# Patient Record
Sex: Male | Born: 1955
Health system: Southern US, Community
[De-identification: ages and names within clinical notes are randomized; demographics above are authoritative.]

## PROBLEM LIST (undated history)

## (undated) DIAGNOSIS — E559 Vitamin D deficiency, unspecified: Secondary | ICD-10-CM

## (undated) DIAGNOSIS — T402X5A Adverse effect of other opioids, initial encounter: Secondary | ICD-10-CM

## (undated) DIAGNOSIS — I872 Venous insufficiency (chronic) (peripheral): Secondary | ICD-10-CM

## (undated) DIAGNOSIS — E785 Hyperlipidemia, unspecified: Secondary | ICD-10-CM

## (undated) DIAGNOSIS — E119 Type 2 diabetes mellitus without complications: Secondary | ICD-10-CM

## (undated) DIAGNOSIS — H269 Unspecified cataract: Secondary | ICD-10-CM

## (undated) DIAGNOSIS — Z940 Kidney transplant status: Secondary | ICD-10-CM

## (undated) DIAGNOSIS — M719 Bursopathy, unspecified: Secondary | ICD-10-CM

## (undated) DIAGNOSIS — K219 Gastro-esophageal reflux disease without esophagitis: Secondary | ICD-10-CM

## (undated) DIAGNOSIS — E669 Obesity, unspecified: Secondary | ICD-10-CM

## (undated) DIAGNOSIS — I10 Essential (primary) hypertension: Secondary | ICD-10-CM

## (undated) DIAGNOSIS — K5903 Drug induced constipation: Secondary | ICD-10-CM

## (undated) DIAGNOSIS — J189 Pneumonia, unspecified organism: Secondary | ICD-10-CM

## (undated) DIAGNOSIS — G4730 Sleep apnea, unspecified: Secondary | ICD-10-CM

## (undated) DIAGNOSIS — E1121 Type 2 diabetes mellitus with diabetic nephropathy: Secondary | ICD-10-CM

## (undated) DIAGNOSIS — R011 Cardiac murmur, unspecified: Secondary | ICD-10-CM

## (undated) DIAGNOSIS — R0600 Dyspnea, unspecified: Secondary | ICD-10-CM

## (undated) DIAGNOSIS — E213 Hyperparathyroidism, unspecified: Secondary | ICD-10-CM

## (undated) HISTORY — DX: Type 2 diabetes mellitus without complications: E11.9

## (undated) HISTORY — DX: Obesity, unspecified: E66.9

## (undated) HISTORY — PX: UPPER GASTROINTESTINAL ENDOSCOPY: SHX188

## (undated) HISTORY — DX: Hyperparathyroidism, unspecified: E21.3

## (undated) HISTORY — DX: Essential (primary) hypertension: I10

## (undated) HISTORY — DX: Unspecified cataract: H26.9

## (undated) HISTORY — PX: WISDOM TOOTH EXTRACTION: SHX21

## (undated) HISTORY — DX: Hyperlipidemia, unspecified: E78.5

## (undated) HISTORY — PX: NEPHRECTOMY TRANSPLANTED ORGAN: SUR880

## (undated) HISTORY — DX: Kidney transplant status: Z94.0

## (undated) HISTORY — DX: Gastro-esophageal reflux disease without esophagitis: K21.9

## (undated) HISTORY — PX: COLONOSCOPY: SHX174

## (undated) HISTORY — DX: Vitamin D deficiency, unspecified: E55.9

## (undated) HISTORY — DX: Type 2 diabetes mellitus with diabetic nephropathy: E11.21

## (undated) HISTORY — PX: EYE SURGERY: SHX253

---

## 1999-03-31 ENCOUNTER — Encounter: Admission: RE | Admit: 1999-03-31 | Discharge: 1999-06-29 | Payer: Self-pay | Admitting: Family Medicine

## 2000-11-28 ENCOUNTER — Encounter: Admission: RE | Admit: 2000-11-28 | Discharge: 2001-02-26 | Payer: Self-pay | Admitting: Emergency Medicine

## 2001-01-29 ENCOUNTER — Encounter: Payer: Self-pay | Admitting: Ophthalmology

## 2001-01-29 ENCOUNTER — Ambulatory Visit (HOSPITAL_COMMUNITY): Admission: AD | Admit: 2001-01-29 | Discharge: 2001-01-29 | Payer: Self-pay | Admitting: Ophthalmology

## 2002-01-01 ENCOUNTER — Ambulatory Visit: Admission: RE | Admit: 2002-01-01 | Discharge: 2002-01-01 | Payer: Self-pay | Admitting: Otolaryngology

## 2002-02-26 ENCOUNTER — Ambulatory Visit: Admission: RE | Admit: 2002-02-26 | Discharge: 2002-02-26 | Payer: Self-pay | Admitting: Otolaryngology

## 2002-10-15 ENCOUNTER — Encounter (INDEPENDENT_AMBULATORY_CARE_PROVIDER_SITE_OTHER): Payer: Self-pay | Admitting: *Deleted

## 2002-10-15 ENCOUNTER — Other Ambulatory Visit: Admission: RE | Admit: 2002-10-15 | Discharge: 2002-10-15 | Payer: Self-pay | Admitting: *Deleted

## 2002-12-19 ENCOUNTER — Ambulatory Visit (HOSPITAL_COMMUNITY): Admission: RE | Admit: 2002-12-19 | Discharge: 2002-12-19 | Payer: Self-pay | Admitting: *Deleted

## 2002-12-19 ENCOUNTER — Encounter: Payer: Self-pay | Admitting: *Deleted

## 2003-06-30 ENCOUNTER — Encounter: Admission: RE | Admit: 2003-06-30 | Discharge: 2003-06-30 | Payer: Self-pay | Admitting: Family Medicine

## 2003-11-14 ENCOUNTER — Inpatient Hospital Stay (HOSPITAL_COMMUNITY): Admission: EM | Admit: 2003-11-14 | Discharge: 2003-11-16 | Payer: Self-pay

## 2004-02-17 ENCOUNTER — Ambulatory Visit (HOSPITAL_COMMUNITY): Admission: RE | Admit: 2004-02-17 | Discharge: 2004-02-17 | Payer: Self-pay | Admitting: Nephrology

## 2004-02-23 ENCOUNTER — Ambulatory Visit (HOSPITAL_COMMUNITY): Admission: RE | Admit: 2004-02-23 | Discharge: 2004-02-23 | Payer: Self-pay | Admitting: Nephrology

## 2004-02-26 ENCOUNTER — Ambulatory Visit (HOSPITAL_COMMUNITY): Admission: RE | Admit: 2004-02-26 | Discharge: 2004-02-26 | Payer: Self-pay | Admitting: Nephrology

## 2004-02-27 ENCOUNTER — Encounter (INDEPENDENT_AMBULATORY_CARE_PROVIDER_SITE_OTHER): Payer: Self-pay | Admitting: Specialist

## 2004-02-27 ENCOUNTER — Observation Stay (HOSPITAL_COMMUNITY): Admission: RE | Admit: 2004-02-27 | Discharge: 2004-02-29 | Payer: Self-pay | Admitting: Nephrology

## 2004-08-01 HISTORY — PX: NEPHRECTOMY TRANSPLANTED ORGAN: SUR880

## 2004-08-01 HISTORY — PX: KIDNEY TRANSPLANT: SHX239

## 2005-02-09 ENCOUNTER — Ambulatory Visit (HOSPITAL_COMMUNITY): Admission: RE | Admit: 2005-02-09 | Discharge: 2005-02-09 | Payer: Self-pay | Admitting: Nephrology

## 2007-07-11 ENCOUNTER — Ambulatory Visit: Payer: Self-pay | Admitting: Internal Medicine

## 2007-07-11 ENCOUNTER — Inpatient Hospital Stay (HOSPITAL_COMMUNITY): Admission: EM | Admit: 2007-07-11 | Discharge: 2007-07-14 | Payer: Self-pay | Admitting: Emergency Medicine

## 2010-08-22 ENCOUNTER — Encounter: Payer: Self-pay | Admitting: Nephrology

## 2010-09-26 ENCOUNTER — Emergency Department (INDEPENDENT_AMBULATORY_CARE_PROVIDER_SITE_OTHER): Payer: BC Managed Care – PPO

## 2010-09-26 ENCOUNTER — Emergency Department (HOSPITAL_BASED_OUTPATIENT_CLINIC_OR_DEPARTMENT_OTHER)
Admission: EM | Admit: 2010-09-26 | Discharge: 2010-09-26 | Disposition: A | Discharge status: Home / Self Care | Payer: BC Managed Care – PPO | Attending: Emergency Medicine | Admitting: Emergency Medicine

## 2010-09-26 DIAGNOSIS — E119 Type 2 diabetes mellitus without complications: Secondary | ICD-10-CM | POA: Insufficient documentation

## 2010-09-26 DIAGNOSIS — W010XXA Fall on same level from slipping, tripping and stumbling without subsequent striking against object, initial encounter: Secondary | ICD-10-CM

## 2010-09-26 DIAGNOSIS — M545 Low back pain, unspecified: Secondary | ICD-10-CM

## 2010-09-26 DIAGNOSIS — Z940 Kidney transplant status: Secondary | ICD-10-CM | POA: Insufficient documentation

## 2010-09-26 DIAGNOSIS — Z794 Long term (current) use of insulin: Secondary | ICD-10-CM | POA: Insufficient documentation

## 2010-09-26 DIAGNOSIS — Y92009 Unspecified place in unspecified non-institutional (private) residence as the place of occurrence of the external cause: Secondary | ICD-10-CM | POA: Insufficient documentation

## 2010-12-14 NOTE — Consult Note (Signed)
Steven [name], NIKIRK NO.:  [id number]   MEDICAL RECORD NO.:  [id number]          PATIENT TYPE:  INP   LOCATION:  2028                         FACILITY:  [facility]   PHYSICIAN:  Windy Kalata, M.D.DATE OF BIRTH:  1956/03/18   DATE OF CONSULTATION:  07/11/2007  DATE OF DISCHARGE:                                 CONSULTATION   REFERRING PHYSICIAN:  Dr. Tery Sanfilippo.   REASON FOR CONSULTATION:  Living related donor renal transplant.   HISTORY OF PRESENT ILLNESS:  A 55 year old white male who is admitted  today because of fevers and cough for 4 weeks.  He was initially treated  by primary care physician with Avelox at the end of November in which  his symptoms improved; however, they have worsened over the past week.  He has complained of fevers, chills and sweats.  His p.o. intake has  been poor over the last few days.  Of note, is the fact that he has been  off his dapsone for at least 6 months or more.  His baseline serum  creatinine is in the upper 1's, and from the last records I have from  September 2008 when he was seen by Dr. [name], serum creatinine  was 1.7.  He has had some mild nausea but denies any GI symptoms in  terms of diarrhea.   PAST MEDICAL HISTORY:  Significant for:  1. End-stage renal disease secondary to diabetes, status post living      related transplant at [facility] in October 2006.  2. Diabetes.  3. Gastroesophageal reflux disease.  4. History of hypertension.   ALLERGIES:  SULFA AND NORVASC WHICH CAUSES SWELLING.   CURRENT MEDICATIONS:  Include albuterol inhaler, Hectorol 0.5 mg every  other day, Lovenox 40 mg nightly, Tricor 48 mg daily, glyburide 5 mg  b.i.d., Lantus 20 units a day, Xopenex inhaler, Actos 30 mg a day,  Prograf 2 mg b.i.d., Myfortic 720 mg b.i.d.   SOCIAL HISTORY:  He is a nonsmoker, though he chews tobacco.  He denies  alcohol use.  He is married with 4 children.  He works as a Financial controller.   FAMILY HISTORY:  Negative for renal disease.   REVIEW OF SYSTEMS:  Appetite has been poor over the last few days.  He  complains of productive cough of yellow/gray sputum.  Denies any  shortness of breath.  Denies any chest pains.  No recent change in bowel  habits.  No new arthritic complaints.  No new skin rashes.  No dysuria.  Rest of the review of systems unremarkable.   PHYSICAL EXAMINATION:  VITAL SIGNS:  Blood pressure 101/59, pulse 97,  temperature 98.1.  GENERAL:  In general, a 55 year old white male in no acute distress.  HEENT:  Sclera nonicteric.  Extraocular muscles are intact.  NECK:  Reveals no JVD.  LUNGS:  Reveal bibasilar crackles in both bases, the right greater than  left.  HEART:  Regular rate and rhythm with a 2/6 systolic murmur at the left  sternal border.  ABDOMEN:  Positive bowel sounds, nontender, nondistended.  Transplant in  the right lower quadrant which is nontender with no bruits.  EXTREMITIES:  Zero-to-trace edema.  NEUROLOGIC EXAM:  Cranial nerves are intact.  There is no asterixis.   IMPRESSION:  1. Living related donor renal transplant with slight increase in      creatinine, probably secondary to infection plus or minus some      volume depletion.  2. Hypokalemia.  3. Fever possibly related to pneumonia, though his chest x-ray      currently is not very impressive.  4. Diabetes mellitus.   RECOMMENDATIONS:  I agree with IV hydration.  We will replace potassium.  We will check a magnesium level, check Prograf level, recheck serum  creatinine in the morning.  I would strongly consider coverage for  Pneumocystis pneumonia as he has been off his dapsone for over 6 months.  I have notified the ID resident of this so she can discuss it with the  infectious disease attending.   Thank you very much for the consult.  I will follow the patient with  you.           ______________________________  Windy Kalata, M.D.      MTM/MEDQ  D:  07/11/2007  T:  07/12/2007  Job:  [id number]

## 2010-12-17 NOTE — H&P (Signed)
NAME:  Steven [name], MENCH NO.:  [id number]   MEDICAL RECORD NO.:  [id number]                   PATIENT TYPE:  INP   LOCATION:  2004                                 FACILITY:  [facility]   PHYSICIAN:  Jon Gills, M.D.             DATE OF BIRTH:  [birth year]   DATE OF ADMISSION:  11/14/2003  DATE OF DISCHARGE:                                HISTORY & PHYSICAL   IDENTIFICATION STATEMENT:  This is a 55 year old white gentleman with morbid  obesity, obstructive sleep apnea, diabetes mellitus, who is here with  complaints of lower extremity edema since Monday night and some shortness of  breath.  His primary care physician is Dr. Alcario Drought with [facility].  His pulmonologist is Dr. Wilburn Cornelia.  His nephrologist is Dr.  Corliss Parish.   HISTORY OF PRESENT ILLNESS:  This patient was in his usual state of health  when Monday night when he was bending over putting on his shoes he started  experiencing increasing shortness of breath.  He also has noticed some  dyspnea on exertion about the same time.  In addition, the patient has  noticed lower extremity edema over the same time.  Of significance, he has  had a weight gain of about 20 pounds over the past several months.  He has  had no paroxysmal nocturnal dyspnea, and there is no orthopnea.  He normally  sleeps flat with him needing CPAP.  So in spite of his sense of shortness of  breath and lower extremity edema, there has been no orthopnea.  Of  significance once again, he has been on Norvasc over the past 3 or 4 months.  The patient did notice an episode of right-sided chest pain radiating to the  back on Tuesday.  He apparently woke up with it that morning.  He did  notice, however, certain types of right arm movement would make it better,  and other times certain other types of right arm movement would make it  worse.  When this started happening he was out of town, he had called Dr.  [name], his nephrologist, who advise him over the phone to increase  his Lasix from 40 mg b.i.d. to 80 mg q.a.m. and keep the same dose in the  evening.   In regards to his cardiac risk factors, there is diabetes mellitus, he does  have a history of hypertension, he is obese, he chews tobacco, and there is  a great-grandfather on the maternal side with MI, but no one else sooner.  His cholesterol apparently is normal.  He does not exercises on a regular  basis secondary to his anemia.   Of significance also, is the fact that he does have chronic ongoing anemia  which is normocytic, normochromic, that was diagnosed approximately two  years ago, but his hemoglobin tends to run anywhere from 9 to 10.   The patient was seen  in Dr. Shelva Majestic clinic today and the patient was  told by Dr. [name] that because of the weekend she would have just  sent him as an outpatient to see cardiology, but with this weekend  approaching, she felt that it might be best that he got admitted.  The  patient has noted that with an increase in the Lasix he has had some  increased urination.  Also of significance, is the fact that he does have  proteinuria that has been noted for which he was initially prompted to be  seen by nephrology.  As mentioned, Dr. [name] had written a note  giving some breakdown of his symptoms that he had presented with.   ALLERGIES:  SULFA, he gets a rash and passes out.  This was as a child.  There is no shortness of breath with it or anaphylaxis type of reaction.  Also of significance, he has been on Lasix which is a good thing.   CURRENT MEDICATIONS:  1. Lasix 80 mg p.o. q.a.m. and 40 mg p.o. q.p.m.  2. Glyburide 5 mg p.o. b.i.d.  3. Lisinopril 40 mg p.o. daily.  4. Norvasc 5 mg p.o. b.i.d. for the past 3 or 4 months.  5. Iron 325 mg p.o. b.i.d.  6. Aspirin 81 mg p.o. daily.  7. Protonix 40 mg p.o. daily.   PAST MEDICAL HISTORY:  1. Diabetes mellitus  with retinopathy, status post vitrectomy, membrane     peeling with photocoagulation in the left eye in July 2002, for pre-     retinal hemorrhage.  He also has proteinuria.  2. History of hypertension.  3. History of chronic renal insufficiency with proteinuria which Dr.     Shelva Majestic notes that came with him from the clinic says it was multi-     factorial, probably from diabetes mellitus, contribution from     hypertension, and his former chronic non-steroidal anti-inflammatory use.     He did have a renal ultrasound done in November 2004, according to the E-     chart here which showed essentially normal kidneys.  4. History of anemia which was diagnosed three years ago and has had     extensive workup by oncology.  The workup started towards the end of     2003, and may be beginning into 2004.  He has had very extensive workup,     including bone marrow biopsies and information sent to a facility in     [place], and I am thinking maybe it is __________, I am not very sure.     All of the workup so far has been inconclusive without any definitive     diagnosis for his anemia.  His hemoglobin in January 2005, was 10.4,     based on the information that came from Dr. Shelva Majestic clinic.  He     has also had EGD and colonoscopy done in 2004, by Dr. Cristina Gong, which     essentially showed reflux on the upper endoscopy, but was not significant     enough to contribute towards his anemia, and his colonoscopy only showed     benign polyps.  5. History of obstructive sleep apnea diagnosed two years ago.  He is on     CPAP.  He just received a new mask, but not for failure of the previous     mask, but just to get an updated mask.  6. 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  7. History of gastroesophageal reflux disease.  PAST SURGICAL HISTORY:  1. As mentioned earlier, in  July 2002, underwent vitrectomy and membrane     peeling with photocoagulation for pre-retinal hemorrhage.  2. Left knee surgery to his kneecaps secondary to sports injury.  3. Right little finger has a Teflon joint.  This was incurred as a little     child.  4. Right hand surgery from crushing football injury.  He has also had scalp     cysts removed and with some teeth extraction.   FAMILY HISTORY:  Negative for cancers of any kind, negative for anemia of  any kind.  Just as mentioned earlier, MI as noted in the maternal great-  grandfather.   SOCIAL HISTORY:  He has been married for the past six years.  They have  three kids.  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.  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  All of which has been looked into as part of  his workup for his anemia.   PHYSICAL EXAMINATION:  VITAL SIGNS:  Temperature of 98.1, blood pressure  173/80, pulse of 80, respiratory rate 18, saturations on room air were 100%.  His repeat blood pressure was 135/63, pulse of 75, respiratory rate 16, and  saturations of 99%.  This was after he had received Lasix 40 mg IV in the  emergency room.  GENERAL:  He is in no apparent distress.  Certainly does not look dyspneic  or tachypneic.  He is morbidly obese, especially with abdominal obesity.  HEENT:  He has __________JVD, but there is no thyromegaly.  His pulses in  the carotids are good.  LUNGS:  Actually clear to auscultation bilaterally without any crackles or  wheezes.  EXTREMITIES:  In the sacral area, there is no presacral edema.  BACK:  No CV angle tenderness.  ABDOMEN:  Morbidly obese, bowel sounds are normal, soft, nontender.  Certainly, it is difficult to tell organomegaly with his abdominal size.  EXTREMITIES:  Lower extremities:  He has about 2+ pitting edema in the  pretibial area and maybe trace in the thighs.  He has small  little red spots  from some insect bites on the right leg.  NEUROLOGIC:  No gross deficits.  Cranial nerves are grossly intact.   LABORATORY DATA:  He had only a H&H done and ISTAT electrolytes, and the  CPK.  His blood gas showed a pH of 7.37, PCO2 of 42, bicarbonate of 24,  there was no mention of O2 since this was done to obtain his electrolytes.  His H&H is 9.2 and 27, I do not have the rest of the CBC.  His ISTAT  electrolytes showed a sodium of 142, potassium of 2.3, chloride of 106, BUN  and creatinine of 42 and 2.9, glucose of 92.  He did have a BMP done through  Dr. Shelva Majestic clinic, and that showed a sodium of 146, potassium of 3.6  on this one, chloride of 105, BUN was 40, creatinine of 2.8, albumin of 2.9,  phosphorus of 4.9, and bicarbonate was 26.  His CPK is elevated at 396,  troponin-I is normal at 0.02, MB is elevated at 14.2, BNP is 134.3, which is not significantly elevated.  His EKG done at Dr. Shelva Majestic clinic I  believe shows normal sinus rhythm, there is really no ST-T wave changes,  there is no suggestion of old MI either.  His chest x-ray which is a  portable x-ray shows cardiomegaly and some mild vascular congestion, but no  pulmonary edema.  There is no infiltrative process suggested either.   ASSESSMENT AND PLAN:  1. Lower extremity edema with shortness of breath and dyspnea on exertion,     possibly has right-sided heart  failure secondary to underlying lung     disease, probably either from whether he might have chronic pulmonary     embolism with recent right-sided chest pain being of a concern.     Certainly with his obstructive sleep apnea pattern which can cause     pulmonary hypertension also, although he has been treated, needs to be     considered.  Currently, there is no suggestion of left-sided heart     failure.  His BNP is normal, although some of it may also be reflective     of the fact that his Lasix was recently increased;  however,  with his BNP     being fairly unremarkable, my guess is that he really was not in left-     sided heart failure.  His elevated CPK certainly could be explained by     the fact that he has renal insufficiency which would then account for     decreased clearance of the creatinine kinase.  The troponin-I being in     the normal range would correlate with this picture.  I will, however, go     ahead and bring him in, rule him out for a myocardial infarction using     his troponins, and try to obtain a V/Q scan to rule out pulmonary     embolism.  I spoke with Dr. Jaci Standard from cardiology to see if we could     facilitate getting an echocardiogram with talking with cardiology, and     she said that since it is non-emergent that we would not be able to get     an echocardiogram.  Therefore, the plan is that if his V/Q scan is     negative and his troponin-I is negative for myocardial infarction, we     will get to send him home tomorrow.  Certainly, I think some of his lower     extremity edema may be coming from the Norvasc which we know can cause     lower extremity edema with vasodilatation there in the lower extremity     vasculature.  Therefore, we will go ahead and discontinue his Norvasc for     now.  I am going to place him on IV diuresis through today and see how he     does, but clinically he actually looks very good and stable.  Certainly,     having anemia does not help his symptoms, however, since anemia is not a     new problem I do not think it is causing a major contribution towards his     heart failure, but certainly some.  2. History of anemia.  We will continue him on his iron.  We will not do any     further workup at this point in time aside from checking his TSH again     because of some concerns of weight gain and change in BM.  This is     something that Dr. [name] from her note is  also looking into and    doing any further studies, but for this hospitalization we  will not do     anything aside from continuing his iron.  3. Weight gain with some change in his BM.  Certainly, some of the weight     came can be secondary to his lower extremity edema, and also from     decreased activity secondary to decreased energy from his anemia.  He has     had some change in BM's, and that might be secondary to his increased     iron which he correlates with, but we will go ahead and do a TSH.  He has     had a normal colonoscopy, so we certainly are not worried about     gastrointestinal pathology, and he has also had     esophagogastroduodenoscopy.  4. Mild hypokalemia.  Some of it may be with his increase in his Lasix.  He     certainly did get IV Lasix here, and I do not know if the labs were done     after he received the IV Lasix.  But, we will go ahead and replace him     gently since his creatinine is 2.9.  5. History of diabetes mellitus.  He has had acceptable hemoglobins, A1C's     done as an outpatient from what he tells me, and his sugars really have     not been running erratic, and they have been running anywhere from 100 to     115, so I do not see a reason for [place] to do another hemoglobin A1C with     this admission.  We will just do the CBG's t.i.d. while he is here, and     continue his Glipizide and give him an appropriately managed diet.  6. History of hypertension.  Since I am taking him off of Norvasc secondary     to the lower extremity edema, I will add possibly Cardizem to his     regimen.  We will continue him on his Lisinopril.  I was thinking of     adding an angiotensin receptor blockade, but I will hold off on this and     we will leave this to Dr. [name], especially with [place] thinking of     discharging him possibly tomorrow if he rules out for a pulmonary     embolism and myocardial infarction, but certainly for now we will do the     Cardizem and send him home on Cardizem which I understand will also help     from a  proteinuria standpoint.  I will continue his Lasix as mentioned     earlier, but will do it intravenously at least for the next 24 hours.  7. Obstructive sleep apnea.  We will continue his CPAP.  His wife is going     to bring his mask in.  8. History of gastroesophageal reflux disease.  He will be continued on his     Protonix.                                                Jon Gills, M.D.    RRV/MEDQ  D:  11/14/2003  T:  11/15/2003  Job:  [id number]   cc:   Theophilus Kinds, M.D.  [place], [place]  Fax: [phone]   Dr. Wilburn Cornelia  Pulmonologist   Louis Meckel, M.D.  [address]  [place]  [place] [address]  Fax: [phone]

## 2010-12-17 NOTE — Discharge Summary (Signed)
NAMEAXCEL, BHAGAT NO.:  [id number]   MEDICAL RECORD NO.:  [id number]          PATIENT TYPE:  INP   LOCATION:  2028                         FACILITY:  [facility]   PHYSICIAN:  Tery Sanfilippo, MD     DATE OF BIRTH:  09/23/1955   DATE OF ADMISSION:  07/11/2007  DATE OF DISCHARGE:  07/14/2007                               DISCHARGE SUMMARY   DISCHARGE DIAGNOSES:  1. Dyspnea secondary community acquired pneumonia.  2. Mild to moderate aortic stenosis by echo with normal left      ventricular function as an outpatient.  3. History of renal transplant October 2006, with a creatinine 1.35 at      discharge.  4. Obesity and sleep apnea, on CPAP.  5. Coronary calcification noted on CT scan.   HOSPITAL COURSE:  The patient is a 55 year old male who was sent to Dr.  Elisabeth Cara for history of dyspnea.  He had a fever over 103 when we saw him  in the office.  He appeared ill.  He was admitted to telemetry and seen  in consult by the ID service as well as renal service.  It was suspected  he had community-acquired pneumonia.  CT scan confirmed pneumonia in the  lower lobes.  There was also an incidental note of calcified of coronary  arteries which appeared advanced for his age.  He was treated with IV  antibiotics.  The renal service followed him with [place].  He improved on  antibiotics.  We felt he was stable for discharge July 14, 2007.  His creatinine at discharge is 1.35.  He will follow up with the renal  service, and Dr. Elder Cyphers and Dr. Elisabeth Cara.   DISCHARGE MEDICATIONS:  1. Prograf 2 tablets twice a day.  2. Myfortic 720 mg 2 tablets twice a day.  3. Glyburide 5 mg twice a day.  4. Actos 30 mg daily.  5. Tricor 48 mg a day.  6. Hectorol 0.5 mcg every other day.  7. Lantus insulin 20 units daily.  8. NovoLog on sliding scale.  9. Avelox 400 mg a day as directed.  10.Mucinex 2 tablets b.i.d.   LABORATORY DATA:  White count at discharge 8.5, hemoglobin 11.8,  hematocrit 35.9, platelets 109.  INR 1.3.  Sodium 134, potassium 4.2,  BUN 15, creatinine 1.35, his creatinine peaked during this  hospitalization and 2.28.  Liver functions were normal.  Albumin is low  at 2.8 at discharge.  BNP was 100.  Blood culture was negative.  Sputum  was negative for Legionella.  Urine culture was negative.  Sputum  culture was inadequate.  Influenza test was negative.   DISCHARGE DIAGNOSES:  1. Dyspnea secondary community-acquired pneumonia.  2. Mild to moderate AS by echocardiogram.  3. Renal transplant with acute on chronic renal insufficiency this      admission, improved at discharge.  4. Obesity and sleep apnea on CPAP.  5. Insulin-dependent diabetes.  6. Calcified coronaries out of proportion to his age noted on CT scan      of his chest.   PLAN:  The patient will  follow-up with Dr. Elder Cyphers and Dr. Elisabeth Cara as an  outpatient as well as [facility].      Erlene Quan, P.A.      Tery Sanfilippo, MD  Electronically Signed    LKK/MEDQ  D:  08/13/2007  T:  08/13/2007  Job:  [id number]   cc:   [facility] Genelle Bal, M.D.

## 2010-12-17 NOTE — Discharge Summary (Signed)
NAME:  Steven [name], Steven [name]                         ACCOUNT NO.:  [id number]   MEDICAL RECORD NO.:  [id number]                   PATIENT TYPE:  INP   LOCATION:  2004                                 FACILITY:  [facility]   PHYSICIAN:  Melissa L. Lovena Le, MD               DATE OF BIRTH:  1955/09/13   DATE OF ADMISSION:  11/14/2003  DATE OF DISCHARGE:  11/16/2003                                 DISCHARGE SUMMARY   ADMISSION DIAGNOSES:  1. Lower extremity swelling.  2. Hypertension.  3. Diabetes.   DISCHARGE DIAGNOSES:  1. Volume overload possibly from right heart failure. 2-D echo to confirm     diagnosis.  2. Patient with renal dysfunction in the setting of volume overload.  3. Hypertension, better controlled. We did discontinue his Norvasc, which     may have been contributing to his lower extremity swelling and started on     Cardizem CD.  4. Diabetes. He remains on all his oral medications. A hemoglobin A1C is     pending at the time of discharge to assess the current management state     of the diabetes.  5. Obstructive sleep apnea. Currently on a C-Pap machine.  6. Chronic, stable anemia. Previous workup done as an outpatient.   DISCHARGE MEDICATIONS:  1. Lasix 80 mg p.o. q.a.m., 40 mg p.o. q.p.m.  2. Glyburide 5 mg p.o. b.i.d.  3. Aspirin 81 mg daily.  4. Iron 325 mg twice daily.  5. Protonix 40 mg p.o. daily.  6. Cardizem CD 240 mg p.o. daily.  7. Colace 100 mg p.o. b.i.d. for constipation.  8. Potassium chloride 20 mEq p.o. daily.   HISTORY OF PRESENT ILLNESS:  The patient is a 55 year old white male with a  known history of chronic renal insufficiency, diabetes, and chronic  obstructive sleep apnea who states that he noticed increased lower extremity  edema, which came on rapidly with minimal elements of shortness of breath.  The patient states that he was bending over to put his shoes on when he  started to experience the shortness of breath and then noticed further  dyspnea  later on in the evening. The patient relates a weight gain of 20  pounds over the past several months. He denied PND or orthopnea, but on  physical exam in the emergency room was noted to have 2-3+ pitting edema. He  was seen by his nephrologist in the outpatient setting who felt initially  that increasing his Lasix would suffice and then decision was made to bring  him into the hospital for further evaluation. The patient was admitted to  telemetry. He was ruled out on cardiac enzymes. Of note, his CPKs and MBs  were elevated, however, in discussing this with cardiology, this is likely  related to his chronic renal insufficiency. Troponins at no time were  increased, so therefore, It was felt that this could be further worked up  as  an outpatient. We converted to observation visit to a full hospital stay in  order to provide him with IV diuresis with Lasix and titrate his cardiac  medications. The Norvasc was discontinued in light of the lower extremity  edema, which may be exacerbated by Norvasc and he was started on Cardizem CD  240 mg daily, which he tolerated well. On the day of discharge his vital  signs were stable. Temperature 97.2, blood pressure 137/76, pulse 71,  respiratory rate 20. His blood glucoses during the hospitalization ranged  from the low 120s to as high as 343. His hemoglobin A1C at the time of this  dictation is pending.   PHYSICAL EXAMINATION:  GENERAL:  This is a bright, well-developed, well-  nourished white male in no acute distress.  HEENT:  His pupils are equal, round, and reactive to light. Extraocular  movements intact. Mucous membranes moist.  NECK:  Supple. There is no JVD, however. Today is noted a right carotid  bruit, which sounds as if it is referred from his right intercostal space  related to the aortic valve.  CHEST:  Clear to auscultation with distant breath sounds.  CARDIOVASCULAR:  Regular rate and rhythm. Positive S1, S2. No S3, S4. There  are  some premature ventricular contractions noted. He does have a 1-2/6  grade murmur in the right intercostal space that radiates to the right neck.  ABDOMEN:  Obese, nontender. Nondistended with positive bowel sounds.  EXTREMITIES:  Improved edema, which could be trace graded with TED hose in  place, 1+ pulses bilaterally in the lower extremities.   LABORATORY DATA:  Stable creatinine of 2.8 at the time of discharge. A BUN  of 41, potassium 0.0, which will be repleted orally. His thyroid was  examined and found to be 1.184 during the hospitalization. Beta natriuretic  peptide mildly elevated at 134.4. Negative cardiac enzymes except for mildly  elevated CPK.  A hemoglobin A1C and lipid panel were pending at the time of  discharge.   The patient will be started on mild potassium replacement at 20 mEq daily  and minus the increased Lasix dosing.   DISCHARGE INSTRUCTIONS:  He is instructed to follow up with Dr. Alcario Drought next  week to arrange a 2-D echo and cardiology consult as well as follow-up his  laboratory values. At the time of discharge the patient is stable. We have  reviewed stress reduction, dietary restrictions in terms of fluid and salt  as well as weight reduction. We have also discontinued using chewing  tobacco. We have instructed him on the risks of oropharyngeal cancer and he  expressed understanding. We will recommend as needed a 2-D echo and cardiac  follow-up as well as labs to assess his renal function and potassium.                                                Melissa L. Lovena Le, MD    MLT/MEDQ  D:  11/16/2003  T:  11/17/2003  Job:  [id number]   cc:   Theophilus Kinds, M.D.  [place], [place]  Fax: [phone]   Louis Meckel, M.D.  [address]  [place]  [place] [address]  Fax: [phone]

## 2010-12-17 NOTE — Op Note (Signed)
[place]  Patient:    Steven [name], Steven [name]                      MRN: [id number] Proc. Date: 01/29/01 Adm. Date:  [id number] Attending:  Elmon Else                           Operative Report  PREOPERATIVE DIAGNOSIS:  Proliferative diabetic retinopathy, left eye, with preretinal hemorrhage.  POSTOPERATIVE DIAGNOSIS:  Proliferative diabetic retinopathy, left eye, with preretinal hemorrhage.  PROCEDURES:  Pars plana vitrectomy and membrane peeling, supplemental panretinal photocoagulation, left eye.  SURGEON:  Elmon Else, M.D.  ASSISTANT:  Duane Lope, L.P.N.  ANESTHESIA:  General endotracheal.  ESTIMATED BLOOD LOSS:  Less than 1 cc.  COMPLICATIONS:  None.  DESCRIPTION OF PROCEDURE:  The patient was taken to the operating room, where after induction of general anesthesia, the left eye was prepped and draped in the usual fashion.  The lid speculum inserted, and a conjunctival peritomy was developed temporally and superonasally with relaxing incisions at 3 and 11. Hemostasis obtained with eraser cautery.  Sclerotomies were fashioned 3.75 mm posterior to the limbus at 1:30, 10:30, and 4:30.  The superior sclerotomies were temporarily plugged and a 4 mm infusion cannula secured inferotemporally with a temporary suture of 7-0 Vicryl.  The tip was visually inspected and found to be in the vitreous cavity.  A Landers ring was secured to the globe with 7-0 Vicryl sutures at 3 and 9 and the plugs removed.  The 30 degree prismatic lens was applied to the surface of the eye and a central core, followed by a peripheral, vitrectomy was performed.  There was no posterior hyaloid separation.  A magnifying flat lens was applied to the surface of the eye.  An opening in the posterior hyaloid was made over the macular area and then the 90 degree Legrand Como pick used to gently tease the posterior hyaloid off the surface of the retina along the  inferotemporal arcade.  An area of focal adhesion accounting for _____ this was severed with sharp dissection using the [facility] curved scissors.  Posterior hyaloid was then freed inferiorly and stripped out to the periphery.  The attention was then directed in the nasal direction.  Additional adhesions were encountered, and these were likewise sharply dissected with the [facility] scissors and the posterior hyaloid lifted off these areas.  Attention then ensued in a clockwise direction, encountering adhesions at 11 as well as 1.  These were trimmed back to the surface of the retina with a vitrector and then sharp dissection used to remove the remaining attachments.  The peripheral vitrectomy was then completed 360 degrees.  An area of small hemorrhage could be seen superiorly where a previous attachment had been present, but scleral depression and further trimming of the vitreous base in this area revealed there to be no retinal breaks and no residual vitreous adhesion.  Hemorrhage stopped spontaneously, and the silicone-tipped catheter was then used to vacuum all the hemorrhage in the posterior segment. The instrument removed from the eye as the hole was plugged.  The Landers ring and lens were removed.  Inspection with the indirect ophthalmoscope and scleral depression revealed there to be no peripheral retinal breaks or tears, and light far peripheral panretinal photocoagulation was supplemented with the headscope laser.  The superior sclerotomy was then closed with 7-0 Vicryl. The infusion cannula was removed and the  preplaced suture secured.  The conjunctiva was drawn up and reapproximated with interrupted and running sutures of 6-0 plain gut.  The pressure was checked with a Barraquer tonometer and found to lay approximately at 23.  The subconjunctival space irrigated with 0.75% Marcaine followed by subconjunctival injection of 100 mg of ceftazidime and 20 mg of Decadron.  The lid speculum was  then removed and a mixed antibiotic ointment applied to the surface of the eye.  An eye patch and shield were placed over the patients eye.  On awakening from anesthesia, the patient left the operating room in good condition. DD:  01/29/01 TD:  01/29/01 Job: [id number]

## 2011-05-09 LAB — CBC
HCT: 35.9 — ABNORMAL LOW
HCT: 36.3 — ABNORMAL LOW
Hemoglobin: 11.8 — ABNORMAL LOW
Hemoglobin: 12.1 — ABNORMAL LOW
MCHC: 32.8
MCHC: 33.4
MCV: 86.2
MCV: 87.3
Platelets: 108 — ABNORMAL LOW
Platelets: 109 — ABNORMAL LOW
RBC: 4.11 — ABNORMAL LOW
RBC: 4.21 — ABNORMAL LOW
RDW: 16 — ABNORMAL HIGH
RDW: 16.3 — ABNORMAL HIGH
WBC: 10.6 — ABNORMAL HIGH
WBC: 8.5

## 2011-05-09 LAB — RENAL FUNCTION PANEL
Albumin: 2.7 — ABNORMAL LOW
Albumin: 2.7 — ABNORMAL LOW
Albumin: 2.8 — ABNORMAL LOW
BUN: 15
BUN: 19
BUN: 20
CO2: 21
CO2: 24
CO2: 24
Calcium: 7.8 — ABNORMAL LOW
Calcium: 7.9 — ABNORMAL LOW
Calcium: 8.3 — ABNORMAL LOW
Chloride: 103
Chloride: 105
Chloride: 105
Creatinine, Ser: 1.35
Creatinine, Ser: 1.63 — ABNORMAL HIGH
Creatinine, Ser: 1.92 — ABNORMAL HIGH
GFR calc Af Amer: 45 — ABNORMAL LOW
GFR calc Af Amer: 55 — ABNORMAL LOW
GFR calc Af Amer: >60
GFR calc non Af Amer: 37 — ABNORMAL LOW
GFR calc non Af Amer: 45 — ABNORMAL LOW
GFR calc non Af Amer: 56 — ABNORMAL LOW
Glucose, Bld: 173 — ABNORMAL HIGH
Glucose, Bld: 188 — ABNORMAL HIGH
Glucose, Bld: 204 — ABNORMAL HIGH
Phosphorus: 2.3
Phosphorus: 2.6
Phosphorus: 2.6
Potassium: 3.3 — ABNORMAL LOW
Potassium: 3.6
Potassium: 4.2
Sodium: 134 — ABNORMAL LOW
Sodium: 135
Sodium: 137

## 2011-05-09 LAB — URINALYSIS, ROUTINE W REFLEX MICROSCOPIC
Glucose, UA: 500 — AB
Hgb urine dipstick: NEGATIVE
Ketones, ur: NEGATIVE
Nitrite: NEGATIVE
Protein, ur: 30 — AB
Specific Gravity, Urine: 1.024
Urobilinogen, UA: 1
pH: 5.5

## 2011-05-09 LAB — COMPREHENSIVE METABOLIC PANEL
ALT: 24
ALT: 24
AST: 20
AST: 24
Albumin: 3.2 — ABNORMAL LOW
Albumin: 3.3 — ABNORMAL LOW
Alkaline Phosphatase: 56
Alkaline Phosphatase: 58
BUN: 19
BUN: 20
CO2: 22
CO2: 24
Calcium: 8.2 — ABNORMAL LOW
Calcium: 8.4
Chloride: 100
Chloride: 100
Creatinine, Ser: 2.1 — ABNORMAL HIGH
Creatinine, Ser: 2.28 — ABNORMAL HIGH
GFR calc Af Amer: 37 — ABNORMAL LOW
GFR calc Af Amer: 41 — ABNORMAL LOW
GFR calc non Af Amer: 31 — ABNORMAL LOW
GFR calc non Af Amer: 34 — ABNORMAL LOW
Glucose, Bld: 281 — ABNORMAL HIGH
Glucose, Bld: 305 — ABNORMAL HIGH
Potassium: 3.2 — ABNORMAL LOW
Potassium: 3.3 — ABNORMAL LOW
Sodium: 132 — ABNORMAL LOW
Sodium: 133 — ABNORMAL LOW
Total Bilirubin: 2 — ABNORMAL HIGH
Total Bilirubin: 2.4 — ABNORMAL HIGH
Total Protein: 6.2
Total Protein: 6.4

## 2011-05-09 LAB — DIFFERENTIAL
Basophils Absolute: 0
Basophils Relative: 0
Eosinophils Absolute: 0 — ABNORMAL LOW
Eosinophils Relative: 0
Lymphocytes Relative: 8 — ABNORMAL LOW
Lymphs Abs: 0.7
Monocytes Absolute: 0.3
Monocytes Relative: 4
Neutro Abs: 7.5
Neutrophils Relative %: 88 — ABNORMAL HIGH

## 2011-05-09 LAB — EXPECTORATED SPUTUM ASSESSMENT W GRAM STAIN, RFLX TO RESP C

## 2011-05-09 LAB — URINE MICROSCOPIC-ADD ON

## 2011-05-09 LAB — CULTURE, BLOOD (ROUTINE X 2)
Culture: NO GROWTH
Culture: NO GROWTH

## 2011-05-09 LAB — EXPECTORATED SPUTUM ASSESSMENT W REFEX TO RESP CULTURE

## 2011-05-09 LAB — LEGIONELLA ANTIGEN, URINE: Legionella Antigen, Urine: NEGATIVE

## 2011-05-09 LAB — URINE CULTURE
Colony Count: NO GROWTH
Culture: NO GROWTH

## 2011-05-09 LAB — TACROLIMUS, BLOOD: Tacrolimus Lvl: 13.9

## 2011-05-09 LAB — P CARINII SMEAR DFA: Pneumocystis carinii DFA: NEGATIVE

## 2011-05-09 LAB — PTH, INTACT AND CALCIUM
Calcium, Total (PTH): 7.6 — ABNORMAL LOW
PTH: 137.1 — ABNORMAL HIGH

## 2011-05-09 LAB — INFLUENZA A+B VIRUS AG-DIRECT(RAPID)
Inflenza A Ag: NEGATIVE
Influenza B Ag: NEGATIVE

## 2011-05-09 LAB — PROTIME-INR
INR: 1.3
Prothrombin Time: 16.2 — ABNORMAL HIGH

## 2011-05-09 LAB — APTT: aPTT: 41 — ABNORMAL HIGH

## 2011-05-09 LAB — B-NATRIURETIC PEPTIDE (CONVERTED LAB): Pro B Natriuretic peptide (BNP): 102 — ABNORMAL HIGH

## 2011-05-09 LAB — GLYCOHEMOGLOBIN, TOTAL: Hemoglobin-A1c: 5.9 % (ref <6.0)

## 2011-05-09 LAB — MAGNESIUM: Magnesium: 1.5

## 2011-05-09 LAB — LACTATE DEHYDROGENASE: LDH: 184

## 2011-10-14 ENCOUNTER — Ambulatory Visit (INDEPENDENT_AMBULATORY_CARE_PROVIDER_SITE_OTHER): Payer: BC Managed Care – PPO | Admitting: Family Medicine

## 2011-10-14 ENCOUNTER — Ambulatory Visit: Payer: BC Managed Care – PPO

## 2011-10-14 VITALS — BP 129/71 | HR 72 | Temp 98.1°F | Resp 18 | Ht 70.0 in | Wt 270.9 lb

## 2011-10-14 DIAGNOSIS — S00511A Abrasion of lip, initial encounter: Secondary | ICD-10-CM

## 2011-10-14 DIAGNOSIS — S63501A Unspecified sprain of right wrist, initial encounter: Secondary | ICD-10-CM

## 2011-10-14 DIAGNOSIS — S63509A Unspecified sprain of unspecified wrist, initial encounter: Secondary | ICD-10-CM

## 2011-10-14 DIAGNOSIS — IMO0002 Reserved for concepts with insufficient information to code with codable children: Secondary | ICD-10-CM

## 2011-10-14 DIAGNOSIS — R071 Chest pain on breathing: Secondary | ICD-10-CM

## 2011-10-14 DIAGNOSIS — S60519A Abrasion of unspecified hand, initial encounter: Secondary | ICD-10-CM

## 2011-10-14 DIAGNOSIS — R0789 Other chest pain: Secondary | ICD-10-CM

## 2011-10-14 DIAGNOSIS — S01501A Unspecified open wound of lip, initial encounter: Secondary | ICD-10-CM

## 2011-10-14 MED ORDER — HYDROCODONE-ACETAMINOPHEN 10-325 MG PO TABS: ORAL_TABLET | ORAL | Status: DC
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
## 2011-10-14 NOTE — Patient Instructions (Signed)
In the event of acute shortness of breath go to the emergency room or come in for a check. Also be checked if running a fever. If problems persist feel free to come in for reevaluation.

## 2011-10-17 ENCOUNTER — Telehealth: Payer: Self-pay

## 2011-10-17 NOTE — Telephone Encounter (Signed)
Pt was seen on last week under personal. Pt is giving [place] permission to talk with his employer and advised that he is ok to return to work today at 5:00.  Please call pts employer, debra squarewell @ [place] ext. [phone] to advise

## 2011-10-17 NOTE — Telephone Encounter (Signed)
Can we write him a note and fax to his employer?

## 2011-10-17 NOTE — Telephone Encounter (Signed)
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 Case.  Pt is working with asst tonight, and she will CB to discuss establishing case with [facility] tomorrow.

## 2011-10-17 NOTE — Telephone Encounter (Signed)
Yes we can write him a note.

## 2011-10-27 ENCOUNTER — Ambulatory Visit: Payer: BC Managed Care – PPO

## 2011-10-27 ENCOUNTER — Ambulatory Visit (INDEPENDENT_AMBULATORY_CARE_PROVIDER_SITE_OTHER): Payer: BC Managed Care – PPO | Admitting: Family Medicine

## 2011-10-27 VITALS — BP 96/60 | HR 75 | Temp 98.3°F | Resp 18 | Ht 70.0 in | Wt 271.0 lb

## 2011-10-27 DIAGNOSIS — R05 Cough: Secondary | ICD-10-CM

## 2011-10-27 DIAGNOSIS — J111 Influenza due to unidentified influenza virus with other respiratory manifestations: Secondary | ICD-10-CM

## 2011-10-27 DIAGNOSIS — S2239XA Fracture of one rib, unspecified side, initial encounter for closed fracture: Secondary | ICD-10-CM

## 2011-10-27 DIAGNOSIS — R059 Cough, unspecified: Secondary | ICD-10-CM

## 2011-10-27 MED ORDER — BENZONATATE 100 MG PO CAPS: ORAL_CAPSULE | ORAL | Status: AC

## 2011-10-27 MED ORDER — OXYCODONE-ACETAMINOPHEN 5-325 MG PO TABS: 1.0000 | ORAL_TABLET | Freq: Four times a day (QID) | ORAL | Status: AC | PRN

## 2011-10-27 NOTE — Progress Notes (Signed)
  Subjective:    Patient ID: Steven [name], male    DOB: [birth year], 56 y.o.   MRN: [id number]  HPI 56 yo male renal transplant patient and diabetic, seen 3/15 after a fall and diagnosed with a rib fracture here with cough.  Daughter sick as well.   Cough started Tuesday (3 day).  Productive green, yellow phlegm.  Great pain with cough.  Hard to take deep breaths - pain and cough.  Hard to sleep.  Given hydrocodone for the rib fracture, not helping much.  Percocet on his list, but doesn't have anymore.  Tessalon perles have always helped cough the best.    Sugars good - fastings under 110.  Last Cr 0.9   Review of Systems Negative except as per HPI     Objective:   Physical Exam  Constitutional: He appears well-developed and well-nourished.  Cardiovascular: Normal rate, regular rhythm, normal heart sounds and intact distal pulses.   No murmur heard. Pulmonary/Chest: Effort normal and breath sounds normal.  Neurological: He is alert.  Skin: Skin is warm and dry.      [facility] Primary radiology reading by Dr. Nadara Eaton: NAD    Assessment & Plan:  Cough, likely flu as daughter who accompanied him diagnosed with Flu B here today.  Discussed could test but could get false negative.  Outside tamiflu window.  Pain with percocet 5.  Tessalon perles for cough.  Rest, fluids.

## 2011-11-07 ENCOUNTER — Ambulatory Visit (INDEPENDENT_AMBULATORY_CARE_PROVIDER_SITE_OTHER): Payer: BC Managed Care – PPO | Admitting: Internal Medicine

## 2011-11-07 ENCOUNTER — Ambulatory Visit: Payer: BC Managed Care – PPO

## 2011-11-07 VITALS — BP 124/67 | HR 85 | Temp 98.7°F | Resp 20 | Ht 70.5 in | Wt 257.0 lb

## 2011-11-07 DIAGNOSIS — J40 Bronchitis, not specified as acute or chronic: Secondary | ICD-10-CM

## 2011-11-07 DIAGNOSIS — R110 Nausea: Secondary | ICD-10-CM

## 2011-11-07 DIAGNOSIS — R059 Cough, unspecified: Secondary | ICD-10-CM

## 2011-11-07 DIAGNOSIS — J9801 Acute bronchospasm: Secondary | ICD-10-CM

## 2011-11-07 DIAGNOSIS — R05 Cough: Secondary | ICD-10-CM

## 2011-11-07 MED ORDER — HYDROCODONE-HOMATROPINE 5-1.5 MG/5ML PO SYRP: 5.0000 mL | ORAL_SOLUTION | Freq: Four times a day (QID) | ORAL | Status: AC | PRN

## 2011-11-07 MED ORDER — ALBUTEROL SULFATE HFA 108 (90 BASE) MCG/ACT IN AERS: 2.0000 | INHALATION_SPRAY | Freq: Four times a day (QID) | RESPIRATORY_TRACT | Status: DC | PRN

## 2011-11-07 MED ORDER — ONDANSETRON HCL 4 MG PO TABS: 4.0000 mg | ORAL_TABLET | Freq: Three times a day (TID) | ORAL | Status: AC | PRN

## 2011-11-07 MED ORDER — MOXIFLOXACIN HCL 400 MG PO TABS: 400.0000 mg | ORAL_TABLET | Freq: Every day | ORAL | Status: AC

## 2011-11-07 NOTE — Progress Notes (Signed)
  Subjective:    Patient ID: Steven [name], male    DOB: 02-Jan-1956, 56 y.o.   MRN: [id number]  HPIC. Diagnosis of influenza B. On March 26 For the last 10 days he has had an increasingly productive cough. He has a fever off-and-on including night sweats over the last 3 days. His cough is harsh and painful and produces yellow brown sputum. He has lots of nausea associated with this coughing and with swallowing the mucous. His nose is clear and there is no pharyngitis. Note recent rib fractures on the right. He can't sleep due to the cough and feels very worn out.    Review of SystemsHis diabetes has remained controlled He denies shortness of breath     Objective:   Physical ExamVital signs are stable except for his weight The TMs are clear Nares are clear Oropharynx is clear/no a.c. Nodes The chest has rales and rhonchi at both bases with an increase wheeze on forced expiration bilaterally The heart is regular without murmurs rubs or gallops There is no peripheral edema He has to splint with coughing due to his right rib pain       [facility] reading (PRIMARY) by  Dr. Laney Pastor No consolidation/mild increased markings right lower lobe.    Assessment & Plan:  Problem #1 acute bronchitis post-flu Problem #2 reactive airway disease/wheezing Problem #3 cough with pain due to rib fractures Problem #4 nausea  Plan Avelox 400 daily for 7 days Hycodan as needed Albuterol 2 puffs every 4-6 hours as needed Zofran if needed Recheck in 5 days if not significantly better

## 2012-05-10 ENCOUNTER — Encounter: Payer: Self-pay | Admitting: Family Medicine

## 2012-05-10 DIAGNOSIS — E785 Hyperlipidemia, unspecified: Secondary | ICD-10-CM | POA: Insufficient documentation

## 2012-05-10 DIAGNOSIS — I10 Essential (primary) hypertension: Secondary | ICD-10-CM | POA: Insufficient documentation

## 2012-05-10 DIAGNOSIS — E119 Type 2 diabetes mellitus without complications: Secondary | ICD-10-CM

## 2012-05-17 ENCOUNTER — Encounter: Payer: Self-pay | Admitting: Family Medicine

## 2012-05-17 ENCOUNTER — Ambulatory Visit: Payer: BC Managed Care – PPO

## 2012-05-17 ENCOUNTER — Ambulatory Visit (INDEPENDENT_AMBULATORY_CARE_PROVIDER_SITE_OTHER): Payer: BC Managed Care – PPO | Admitting: Family Medicine

## 2012-05-17 VITALS — BP 106/60 | HR 73 | Temp 98.5°F | Resp 16 | Ht 70.5 in | Wt 272.8 lb

## 2012-05-17 DIAGNOSIS — R05 Cough: Secondary | ICD-10-CM

## 2012-05-17 DIAGNOSIS — R059 Cough, unspecified: Secondary | ICD-10-CM

## 2012-05-17 DIAGNOSIS — J209 Acute bronchitis, unspecified: Secondary | ICD-10-CM

## 2012-05-17 MED ORDER — AZITHROMYCIN 250 MG PO TABS: ORAL_TABLET | ORAL | Status: DC

## 2012-05-17 MED ORDER — ALBUTEROL SULFATE HFA 108 (90 BASE) MCG/ACT IN AERS: 2.0000 | INHALATION_SPRAY | Freq: Four times a day (QID) | RESPIRATORY_TRACT | Status: DC | PRN

## 2012-05-17 MED ORDER — BENZONATATE 100 MG PO CAPS: 200.0000 mg | ORAL_CAPSULE | Freq: Three times a day (TID) | ORAL | Status: DC | PRN

## 2012-05-17 NOTE — Progress Notes (Signed)
Subjective:    Patient ID: Steven [name], male    DOB: Aug 09, 1955, 56 y.o.   MRN: [id number] Chief Complaint  Patient presents with  . Cough    sometimes productive sore throat throat burns pnd since last weekend    HPI 5d prev cough and sore thraot and chest tightness only w/ cough and cough now prod of yellow-green sputum,  Can't sleep.  Has had a fever of 100.5 last night intermittently. HA much worse w/ cough, some sinus pressure, no SHoB.    Review of Systems  Constitutional: Positive for fever, chills, diaphoresis, activity change, appetite change and fatigue. Negative for unexpected weight change.  HENT: Positive for congestion, sore throat, rhinorrhea, sneezing, postnasal drip and sinus pressure. Negative for ear pain, trouble swallowing and neck stiffness.   Respiratory: Positive for cough, chest tightness and wheezing. Negative for shortness of breath.   Cardiovascular: Negative for chest pain.  Gastrointestinal: Positive for abdominal pain. Negative for vomiting, diarrhea and constipation.  Genitourinary: Negative for dysuria.  Musculoskeletal: Positive for myalgias and arthralgias. Negative for joint swelling and gait problem.  Skin: Negative for rash.  Neurological: Positive for headaches. Negative for syncope.  Hematological: Negative for adenopathy.  Psychiatric/Behavioral: Positive for disturbed wake/sleep cycle.      BP 106/60  Pulse 73  Temp(Src) 98.5 F (36.9 C) (Oral)  Resp 16  Ht 5' 10.5" (1.791 m)  Wt 272 lb 12.8 oz (123.741 kg)  BMI 38.58 kg/m2  SpO2 98% Objective:   Physical Exam  Constitutional: He is oriented to person, place, and time. He appears well-developed and well-nourished. No distress.  HENT:  Head: Normocephalic and atraumatic.  Right Ear: External ear and ear canal normal. Tympanic membrane is retracted.  Left Ear: External ear and ear canal normal. Tympanic membrane is retracted.  Nose: Mucosal edema and rhinorrhea present.    Mouth/Throat: Uvula is midline and mucous membranes are normal. Posterior oropharyngeal erythema present. No oropharyngeal exudate or posterior oropharyngeal edema.  Eyes: Conjunctivae are normal. No scleral icterus.  Neck: Normal range of motion. Neck supple. No thyromegaly present.  Cardiovascular: Normal rate, regular rhythm, normal heart sounds and intact distal pulses.   Pulmonary/Chest: Effort normal and breath sounds normal. No respiratory distress.  Musculoskeletal: He exhibits no edema.  Lymphadenopathy:    He has no cervical adenopathy.  Neurological: He is alert and oriented to person, place, and time.  Skin: Skin is warm and dry. He is not diaphoretic. No erythema.  Psychiatric: He has a normal mood and affect. His behavior is normal.         [facility] reading (PRIMARY) by  Dr. Brigitte Pulse. No abnormality seen,  Assessment & Plan:  Cough - Plan: DG Chest 2 View  Bronchitis, acute  Meds ordered this encounter  Medications  . albuterol (PROVENTIL HFA;VENTOLIN HFA) 108 (90 BASE) MCG/ACT inhaler    Sig: Inhale 2 puffs into the lungs every 6 (six) hours as needed for wheezing.    Dispense:  1 Inhaler    Refill:  2  . azithromycin (ZITHROMAX) 250 MG tablet    Sig: Take 2 tabs PO x 1 dose, then 1 tab PO QD x 4 days    Dispense:  6 tablet    Refill:  0  . benzonatate (TESSALON PERLES) 100 MG capsule    Sig: Take 2 capsules (200 mg total) by mouth 3 (three) times daily as needed for cough.    Dispense:  30 capsule    Refill:  1

## 2013-07-16 ENCOUNTER — Telehealth: Payer: Self-pay

## 2013-07-16 NOTE — Telephone Encounter (Signed)
Fax for blood glucose strips. Pt last seen 05/2012. Sent denial

## 2013-10-02 ENCOUNTER — Ambulatory Visit: Payer: BC Managed Care – PPO

## 2013-10-02 ENCOUNTER — Ambulatory Visit (INDEPENDENT_AMBULATORY_CARE_PROVIDER_SITE_OTHER): Payer: BC Managed Care – PPO | Admitting: Family Medicine

## 2013-10-02 VITALS — BP 130/80 | HR 83 | Temp 98.1°F | Resp 16 | Ht 69.5 in | Wt 270.4 lb

## 2013-10-02 DIAGNOSIS — R509 Fever, unspecified: Secondary | ICD-10-CM

## 2013-10-02 DIAGNOSIS — R059 Cough, unspecified: Secondary | ICD-10-CM

## 2013-10-02 DIAGNOSIS — R05 Cough: Secondary | ICD-10-CM

## 2013-10-02 LAB — POCT CBC
Granulocyte percent: 71.7 %G (ref 37–80)
HCT, POC: 44.7 % (ref 43.5–53.7)
Hemoglobin: 14.6 g/dL (ref 14.1–18.1)
Lymph, poc: 1.2 (ref 0.6–3.4)
MCH, POC: 28.1 pg (ref 27–31.2)
MCHC: 32.7 g/dL (ref 31.8–35.4)
MCV: 86.2 fL (ref 80–97)
MID (cbc): 0.3 (ref 0–0.9)
MPV: 7.7 fL (ref 0–99.8)
POC Granulocyte: 3.9 (ref 2–6.9)
POC LYMPH PERCENT: 22.2 %L (ref 10–50)
POC MID %: 6.1 %M (ref 0–12)
Platelet Count, POC: 186 10*3/uL (ref 142–424)
RBC: 5.19 M/uL (ref 4.69–6.13)
RDW, POC: 14.2 %
WBC: 5.5 10*3/uL (ref 4.6–10.2)

## 2013-10-02 LAB — POCT INFLUENZA A/B
Influenza A, POC: NEGATIVE
Influenza B, POC: NEGATIVE

## 2013-10-02 MED ORDER — ALBUTEROL SULFATE HFA 108 (90 BASE) MCG/ACT IN AERS: 2.0000 | INHALATION_SPRAY | RESPIRATORY_TRACT | Status: DC | PRN

## 2013-10-02 MED ORDER — DOXYCYCLINE HYCLATE 100 MG PO CAPS: 100.0000 mg | ORAL_CAPSULE | Freq: Two times a day (BID) | ORAL | Status: DC

## 2013-10-02 MED ORDER — CEFDINIR 300 MG PO CAPS: 600.0000 mg | ORAL_CAPSULE | Freq: Every day | ORAL | Status: DC

## 2013-10-02 NOTE — Patient Instructions (Addendum)
Thank you for coming in tonight  Use albuterol every 4 hrs while awake Start antibiotics tonight Followup with Dr Carlota Raspberry tomorrow after lunch  Go to ER if you get sicker overnight.

## 2013-10-02 NOTE — Progress Notes (Signed)
   Subjective:    Patient ID: Steven [name], male    DOB: 12-Aug-1955, 58 y.o.   MRN: [id number]  HPI Patient is a 58 y/o male who presents with illness. He started last Thurs/Fri with nausea, vomiting, diarrhea, cough. Whole family had this over the weekend. Started getting a lot of cough, wheezing, congestion, sinus drainage. Coughing up chunks of mucous. Sore throat. Low grade fever 100.1 this am. Felt a little better with tylenol and then things got worse again. Chest congestion. Feeling fatigued. Wife insisted patient come. Getting a lot of intermittent chills. Tmax 101.5 on Monday. Has history of kidney transplant and is on immunosuppressants. Feels a lot of both chest congestion and also also sinus symptoms. No h/o asthma, lung disease, tobacco abuse.  Review of Systems  All other systems reviewed and are negative.       Objective:   Physical Exam  Constitutional: He is oriented to person, place, and time. He appears well-developed and well-nourished. No distress.  HENT:  Head: Normocephalic and atraumatic.  Mouth/Throat: No oropharyngeal exudate.  Eyes: Conjunctivae are normal. Pupils are equal, round, and reactive to light. No scleral icterus.  Neck: Normal range of motion. Neck supple.  Cardiovascular: Normal rate, regular rhythm and intact distal pulses.   Murmur heard. Pulmonary/Chest: Effort normal. No respiratory distress. He has wheezes. He has rales.  Musculoskeletal: Normal range of motion.  Lymphadenopathy:    He has no cervical adenopathy.  Neurological: He is alert and oriented to person, place, and time.  Skin: Skin is warm and dry.  Psychiatric: He has a normal mood and affect. His behavior is normal.   [facility] reading (PRIMARY) by  Dr. Maryln Gottron. Negative for infiltrate/consolidation. Negative.    Assessment & Plan:  #1. Fever/Cough in immunosuppressed transplant patient - CBC with normal WBC and cell lines - CXR negative - Flu swab - Albuterol q4h while awake -  Omnicef and doxy x 1 week - F/u Dr. Carlota Raspberry tomorrow afternoon.

## 2013-10-03 ENCOUNTER — Ambulatory Visit (INDEPENDENT_AMBULATORY_CARE_PROVIDER_SITE_OTHER): Payer: BC Managed Care – PPO | Admitting: Family Medicine

## 2013-10-03 VITALS — BP 148/64 | HR 89 | Temp 98.4°F | Resp 18 | Ht 69.5 in | Wt 269.2 lb

## 2013-10-03 DIAGNOSIS — R509 Fever, unspecified: Secondary | ICD-10-CM

## 2013-10-03 DIAGNOSIS — D899 Disorder involving the immune mechanism, unspecified: Secondary | ICD-10-CM

## 2013-10-03 DIAGNOSIS — R059 Cough, unspecified: Secondary | ICD-10-CM

## 2013-10-03 DIAGNOSIS — D849 Immunodeficiency, unspecified: Secondary | ICD-10-CM

## 2013-10-03 DIAGNOSIS — Z940 Kidney transplant status: Secondary | ICD-10-CM

## 2013-10-03 DIAGNOSIS — R05 Cough: Secondary | ICD-10-CM

## 2013-10-03 DIAGNOSIS — J9801 Acute bronchospasm: Secondary | ICD-10-CM

## 2013-10-03 LAB — POCT CBC
Granulocyte percent: 72.9 %G (ref 37–80)
HCT, POC: 44.8 % (ref 43.5–53.7)
Hemoglobin: 14.7 g/dL (ref 14.1–18.1)
Lymph, poc: 1.1 (ref 0.6–3.4)
MCH, POC: 28.2 pg (ref 27–31.2)
MCHC: 32.8 g/dL (ref 31.8–35.4)
MCV: 85.9 fL (ref 80–97)
MID (cbc): 0.3 (ref 0–0.9)
MPV: 7 fL (ref 0–99.8)
POC Granulocyte: 3.8 (ref 2–6.9)
POC LYMPH PERCENT: 21.7 %L (ref 10–50)
POC MID %: 5.4 %M (ref 0–12)
Platelet Count, POC: 188 10*3/uL (ref 142–424)
RBC: 5.22 M/uL (ref 4.69–6.13)
RDW, POC: 15.1 %
WBC: 5.2 10*3/uL (ref 4.6–10.2)

## 2013-10-03 LAB — COMPLETE METABOLIC PANEL WITH GFR
ALT: 23 U/L (ref 0–53)
AST: 16 U/L (ref 0–37)
Albumin: 3.9 g/dL (ref 3.5–5.2)
Alkaline Phosphatase: 79 U/L (ref 39–117)
BUN: 20 mg/dL (ref 6–23)
CO2: 23 mEq/L (ref 19–32)
Calcium: 8.9 mg/dL (ref 8.4–10.5)
Chloride: 100 mEq/L (ref 96–112)
Creat: 1.3 mg/dL (ref 0.50–1.35)
GFR, Est African American: 71 mL/min
GFR, Est Non African American: 61 mL/min
Glucose, Bld: 175 mg/dL — ABNORMAL HIGH (ref 70–99)
Potassium: 4.1 mEq/L (ref 3.5–5.3)
Sodium: 140 mEq/L (ref 135–145)
Total Bilirubin: 0.6 mg/dL (ref 0.2–1.2)
Total Protein: 7.1 g/dL (ref 6.0–8.3)

## 2013-10-03 NOTE — Patient Instructions (Signed)
Recheck either tomorrow to see Dr Linna Darner or Ouida Sills, or this weekend with me if not improving. Sooner if worse. Check temperature and if running fever return as discussed.  Return to the clinic or go to the nearest emergency room if any of your symptoms worsen or new symptoms occur. You should receive a call or letter about your lab results within the next week to 10 days.

## 2013-10-03 NOTE — Progress Notes (Addendum)
Subjective:   This chart was scribed for Merri Ray, MD by Forrestine Him, [facility] Scribe. This patient was seen in room 4 and the patient's care was started 1:24 PM.     Patient ID: Steven [name], male    DOB: 1956/05/26, 58 y.o.   MRN: [id number]  HPI  HPI Comments: Steven [name] is a 58 y.o. male who presents to [facility] seeking follow up today from 3/4 visit. Pt was seen last night 3/4 by Dr. Maryln Gottron. Initial nausea, vomiting, diarrhea, and cough onset last week with sick contacts. Progressed to cough, sinus drainage, congestion, and low grade fever from 101 yesterday morning up to 101.5 3 days ago. History of immunosuppression as status post renal transplant 05/2006. CBC normal last night. Flu testing negative. Chest X-Ray without acute cardiopulmonary disease. Pt was prescribed Omnicef, Albuterol inhaler, and Doxycylcine at discharge. Here for follow up. Pt states his coughing has some what subsided secondary to albuterol inhaler, however, he still reports green and yellow mucous with remaining cough and congestion. He states he has been taking the Doxycyline as directed and using his inhaler about every 3-4 hours, 2 puffs at a time. Reports chills and fever last night after going to sleep. Checked temperature this morning without a fever. He states he experienced some abdominal discomfort possibly secondary to the medication he was prescribed yesterday. He states he drank some milk after noting abdominal discomfort with improvement. He denies ever being prescribed Prednisone. Denies any urinary abnormalities at this time. Pts PMHx includes HTN and Hyperlipidemia. No other concerns this visit.   Renal function has been stable when last seen by nephrologist (under 1 in January), usually around 1.0-1.2.  Had not taken steroids in past due to transplant.    Patient Active Problem List   Diagnosis Date Noted  . Diabetes mellitus 05/10/2012  .  Hyperlipidemia 05/10/2012  . Unspecified essential hypertension 05/10/2012   Past Medical History  Diagnosis Date  . Hypertension   . Hyperlipidemia   . Diabetic glomerulosclerosis     [facility] 03/21/12 by Dr. Corliss Parish.   Past Surgical History  Procedure Laterality Date  . Kidney transplant  2006   Allergies  Allergen Reactions  . Sulfa Antibiotics    Prior to Admission medications   Medication Sig Start Date End Date Taking? Authorizing Provider  albuterol (PROVENTIL HFA;VENTOLIN HFA) 108 (90 BASE) MCG/ACT inhaler Inhale 2 puffs into the lungs every 4 (four) hours as needed for wheezing or shortness of breath (cough, shortness of breath or wheezing.). 10/02/13  Yes Duane Boston, MD  azithromycin (ZITHROMAX) 250 MG tablet Take 2 tabs PO x 1 dose, then 1 tab PO QD x 4 days 05/17/12  Yes Shawnee Knapp, MD  benzonatate (TESSALON PERLES) 100 MG capsule Take 2 capsules (200 mg total) by mouth 3 (three) times daily as needed for cough. 05/17/12  Yes Shawnee Knapp, MD  cefdinir (OMNICEF) 300 MG capsule Take 2 capsules (600 mg total) by mouth daily. 10/02/13  Yes Duane Boston, MD  doxycycline (VIBRAMYCIN) 100 MG capsule Take 1 capsule (100 mg total) by mouth 2 (two) times daily. 10/02/13  Yes Duane Boston, MD  glipiZIDE (GLUCOTROL) 5 MG tablet Take 5 mg by mouth every morning.   Yes Historical Provider, MD  glyBURIDE (DIABETA) 5 MG tablet Take 5 mg by mouth daily with breakfast.   Yes Historical Provider, MD  HYDROcodone-acetaminophen [facility]) 10-325  MG per tablet Take one every 4-6 hours as needed for severe pain. 10/14/11  Yes Posey Boyer, MD  insulin aspart (NOVOLOG) 100 UNIT/ML injection Inject into the skin 3 (three) times daily before meals. Sliding scale   Yes Historical Provider, MD  insulin glargine (LANTUS) 100 UNIT/ML injection Inject 22 Units into the skin daily.   Yes Historical Provider, MD  mycophenolate (MYFORTIC) 360 MG TBEC Take 360 mg by mouth 2 (two) times  daily.   Yes Historical Provider, MD  omeprazole (PRILOSEC) 40 MG capsule Take 40 mg by mouth daily.   Yes Historical Provider, MD  tacrolimus (PROGRAF) 1 MG capsule Take 1 mg by mouth 2 (two) times daily.   Yes Historical Provider, MD  albuterol (PROVENTIL HFA;VENTOLIN HFA) 108 (90 BASE) MCG/ACT inhaler Inhale 2 puffs into the lungs every 6 (six) hours as needed for wheezing. 05/17/12 05/17/13  Shawnee Knapp, MD  oxycodone-acetaminophen (PERCOCET) 2.5-325 MG per tablet Take 1 tablet by mouth every 4 (four) hours as needed.    Historical Provider, MD   History   Social History  . Marital Status: Married    Spouse Name: N/A    Number of Children: N/A  . Years of Education: N/A   Occupational History  . Not on file.   Social History Main Topics  . Smoking status: Never Smoker   . Smokeless tobacco: Not on file  . Alcohol Use: 0.0 oz/week     Comment: social drinker  . Drug Use: Not on file  . Sexual Activity: Not on file   Other Topics Concern  . Not on file   Social History Narrative  . No narrative on file     Review of Systems  Constitutional: Positive for fever, chills and fatigue.  HENT: Positive for congestion.   Respiratory: Positive for cough and wheezing.   Gastrointestinal: Positive for abdominal pain. Negative for nausea, vomiting and diarrhea.  Genitourinary: Negative for decreased urine volume and difficulty urinating.  Skin: Negative for rash.  Neurological: Negative for headaches.  Psychiatric/Behavioral: Negative for confusion.     Filed Vitals:   10/03/13 1311  BP: 148/64  Pulse: 89  Temp: 98.4 F (36.9 C)  TempSrc: Oral  Resp: 18  Height: 5' 9.5" (1.765 m)  Weight: 269 lb 3.2 oz (122.108 kg)  SpO2: 97%    Objective:  Physical Exam  Vitals reviewed. Constitutional: He is oriented to person, place, and time. He appears well-developed and well-nourished.  HENT:  Head: Normocephalic and atraumatic.  Right Ear: Tympanic membrane, external ear and  ear canal normal.  Left Ear: Tympanic membrane, external ear and ear canal normal.  Nose: No rhinorrhea.  Mouth/Throat: Oropharynx is clear and moist and mucous membranes are normal. No oropharyngeal exudate or posterior oropharyngeal erythema.  Eyes: Conjunctivae are normal. Pupils are equal, round, and reactive to light.  Neck: Neck supple.  Cardiovascular: Normal rate, regular rhythm and intact distal pulses.   Murmur heard. [id number] systolic murmur  Pulmonary/Chest: Effort normal. No respiratory distress. He has wheezes. He has no rhonchi. He has no rales.  Diffuse scattered expiratory wheeze No retraction  Abdominal: Soft. There is no tenderness.  Lymphadenopathy:    He has no cervical adenopathy.  Neurological: He is alert and oriented to person, place, and time.  Skin: Skin is warm and dry. No rash noted.  Psychiatric: He has a normal mood and affect. His behavior is normal.  speaking in full sentences.    Results for orders placed  in visit on 10/03/13  POCT CBC      Result Value Ref Range   WBC 5.2  4.6 - 10.2 K/uL   Lymph, poc 1.1  0.6 - 3.4   POC LYMPH PERCENT 21.7  10 - 50 %L   MID (cbc) 0.3  0 - 0.9   POC MID % 5.4  0 - 12 %M   POC Granulocyte 3.8  2 - 6.9   Granulocyte percent 72.9  37 - 80 %G   RBC 5.22  4.69 - 6.13 M/uL   Hemoglobin 14.7  14.1 - 18.1 g/dL   HCT, POC 44.8  43.5 - 53.7 %   MCV 85.9  80 - 97 fL   MCH, POC 28.2  27 - 31.2 pg   MCHC 32.8  31.8 - 35.4 g/dL   RDW, POC 15.1     Platelet Count, POC 188  142 - 424 K/uL   MPV 7.0  0 - 99.8 fL   Peak flow 500 - predicted 560.   D/w nephrology - can check CMP for renal/hepatic fxn, but no other change in mgt recommended.   Assessment & Plan:  Steven [name] is a 58 y.o. male  Steven [name] is a 58 y.o. male Fever, unspecified - Plan: POCT CBC, COMPLETE METABOLIC PANEL WITH GFR  Cough - Plan: POCT CBC, COMPLETE METABOLIC PANEL WITH GFR  Bronchospasm - Plan: POCT CBC, COMPLETE METABOLIC PANEL WITH  GFR  Immunosuppressed status - Plan: COMPLETE METABOLIC PANEL WITH GFR  History of renal transplant - Plan: COMPLETE METABOLIC PANEL WITH GFR  Suspected viral illness, vs atypical CAP and secondary bronchospasm without XR findings. Continue Omnicef, Doxycycline, and albuterol up to every 4-6 hr if needed.  rtc precautions.   CMP pending to eval renal function and LFT's.    I personally performed the services described in this documentation, which was scribed in my presence. The recorded information has been reviewed and is accurate.

## 2013-10-04 ENCOUNTER — Encounter: Payer: Self-pay | Admitting: Family Medicine

## 2013-10-04 NOTE — Progress Notes (Signed)
Xray read and patient discussed and examined with Dr. Maryln Gottron. Agree with assessment and plan of care per her note.

## 2015-05-24 ENCOUNTER — Encounter: Payer: Self-pay | Admitting: Radiology

## 2015-05-24 DIAGNOSIS — Z940 Kidney transplant status: Secondary | ICD-10-CM

## 2016-07-27 ENCOUNTER — Institutional Professional Consult (permissible substitution): Payer: BC Managed Care – PPO | Admitting: Neurology

## 2016-07-28 ENCOUNTER — Encounter: Payer: Self-pay | Admitting: Neurology

## 2016-08-24 ENCOUNTER — Telehealth: Payer: Self-pay

## 2016-08-24 ENCOUNTER — Institutional Professional Consult (permissible substitution): Payer: BC Managed Care – PPO | Admitting: Neurology

## 2016-08-24 NOTE — Telephone Encounter (Signed)
This is the 2nd No show for new sleep consult. Ok to dismiss per office no show protocol.

## 2016-08-24 NOTE — Telephone Encounter (Signed)
Ok to dismiss per office no show policy.

## 2016-08-25 ENCOUNTER — Encounter: Payer: Self-pay | Admitting: Neurology

## 2016-09-09 DIAGNOSIS — Z940 Kidney transplant status: Secondary | ICD-10-CM

## 2017-07-17 ENCOUNTER — Inpatient Hospital Stay (HOSPITAL_COMMUNITY)
Admission: EM | Admit: 2017-07-17 | Discharge: 2017-07-20 | DRG: 617 | Disposition: A | Discharge status: Home Health | Payer: BC Managed Care – PPO | Attending: Internal Medicine | Admitting: Internal Medicine

## 2017-07-17 ENCOUNTER — Emergency Department (HOSPITAL_COMMUNITY): Payer: BC Managed Care – PPO

## 2017-07-17 ENCOUNTER — Ambulatory Visit: Payer: Self-pay

## 2017-07-17 ENCOUNTER — Emergency Department (HOSPITAL_BASED_OUTPATIENT_CLINIC_OR_DEPARTMENT_OTHER)
Admit: 2017-07-17 | Discharge: 2017-07-17 | Disposition: A | Discharge status: Home / Self Care | Payer: BC Managed Care – PPO | Attending: Emergency Medicine | Admitting: Emergency Medicine

## 2017-07-17 ENCOUNTER — Encounter (HOSPITAL_COMMUNITY): Payer: Self-pay | Admitting: Emergency Medicine

## 2017-07-17 DIAGNOSIS — I129 Hypertensive chronic kidney disease with stage 1 through stage 4 chronic kidney disease, or unspecified chronic kidney disease: Secondary | ICD-10-CM | POA: Diagnosis present

## 2017-07-17 DIAGNOSIS — E669 Obesity, unspecified: Secondary | ICD-10-CM | POA: Diagnosis present

## 2017-07-17 DIAGNOSIS — E1169 Type 2 diabetes mellitus with other specified complication: Secondary | ICD-10-CM | POA: Diagnosis present

## 2017-07-17 DIAGNOSIS — L0390 Cellulitis, unspecified: Secondary | ICD-10-CM

## 2017-07-17 DIAGNOSIS — E6609 Other obesity due to excess calories: Secondary | ICD-10-CM | POA: Diagnosis not present

## 2017-07-17 DIAGNOSIS — Z6836 Body mass index (BMI) 36.0-36.9, adult: Secondary | ICD-10-CM | POA: Diagnosis not present

## 2017-07-17 DIAGNOSIS — Z940 Kidney transplant status: Secondary | ICD-10-CM | POA: Diagnosis not present

## 2017-07-17 DIAGNOSIS — K219 Gastro-esophageal reflux disease without esophagitis: Secondary | ICD-10-CM | POA: Diagnosis present

## 2017-07-17 DIAGNOSIS — M86171 Other acute osteomyelitis, right ankle and foot: Secondary | ICD-10-CM | POA: Diagnosis not present

## 2017-07-17 DIAGNOSIS — E1122 Type 2 diabetes mellitus with diabetic chronic kidney disease: Secondary | ICD-10-CM | POA: Diagnosis present

## 2017-07-17 DIAGNOSIS — Z794 Long term (current) use of insulin: Secondary | ICD-10-CM | POA: Diagnosis not present

## 2017-07-17 DIAGNOSIS — E119 Type 2 diabetes mellitus without complications: Secondary | ICD-10-CM

## 2017-07-17 DIAGNOSIS — N189 Chronic kidney disease, unspecified: Secondary | ICD-10-CM

## 2017-07-17 DIAGNOSIS — M869 Osteomyelitis, unspecified: Secondary | ICD-10-CM | POA: Diagnosis present

## 2017-07-17 DIAGNOSIS — L03115 Cellulitis of right lower limb: Secondary | ICD-10-CM | POA: Diagnosis present

## 2017-07-17 DIAGNOSIS — I10 Essential (primary) hypertension: Secondary | ICD-10-CM | POA: Diagnosis not present

## 2017-07-17 DIAGNOSIS — D696 Thrombocytopenia, unspecified: Secondary | ICD-10-CM | POA: Diagnosis present

## 2017-07-17 DIAGNOSIS — E1165 Type 2 diabetes mellitus with hyperglycemia: Secondary | ICD-10-CM | POA: Diagnosis present

## 2017-07-17 DIAGNOSIS — N183 Chronic kidney disease, stage 3 (moderate): Secondary | ICD-10-CM | POA: Diagnosis not present

## 2017-07-17 DIAGNOSIS — R011 Cardiac murmur, unspecified: Secondary | ICD-10-CM | POA: Diagnosis present

## 2017-07-17 DIAGNOSIS — Z87891 Personal history of nicotine dependence: Secondary | ICD-10-CM | POA: Diagnosis not present

## 2017-07-17 DIAGNOSIS — E785 Hyperlipidemia, unspecified: Secondary | ICD-10-CM | POA: Diagnosis present

## 2017-07-17 DIAGNOSIS — N179 Acute kidney failure, unspecified: Secondary | ICD-10-CM | POA: Diagnosis present

## 2017-07-17 LAB — CBC WITH DIFFERENTIAL/PLATELET
Basophils Absolute: 0 10*3/uL (ref 0.0–0.1)
Basophils Relative: 0 %
Eosinophils Absolute: 0.1 10*3/uL (ref 0.0–0.7)
Eosinophils Relative: 1 %
HCT: 36 % — ABNORMAL LOW (ref 39.0–52.0)
Hemoglobin: 12.3 g/dL — ABNORMAL LOW (ref 13.0–17.0)
Lymphocytes Relative: 8 %
Lymphs Abs: 0.6 10*3/uL — ABNORMAL LOW (ref 0.7–4.0)
MCH: 27.8 pg (ref 26.0–34.0)
MCHC: 34.2 g/dL (ref 30.0–36.0)
MCV: 81.4 fL (ref 78.0–100.0)
Monocytes Absolute: 0.7 10*3/uL (ref 0.1–1.0)
Monocytes Relative: 10 %
Neutro Abs: 5.4 10*3/uL (ref 1.7–7.7)
Neutrophils Relative %: 81 %
Platelets: 112 10*3/uL — ABNORMAL LOW (ref 150–400)
RBC: 4.42 MIL/uL (ref 4.22–5.81)
RDW: 13.7 % (ref 11.5–15.5)
WBC: 6.7 10*3/uL (ref 4.0–10.5)

## 2017-07-17 LAB — GLUCOSE, CAPILLARY: Glucose-Capillary: 292 mg/dL — ABNORMAL HIGH (ref 65–99)

## 2017-07-17 LAB — BASIC METABOLIC PANEL
Anion gap: 11 (ref 5–15)
BUN: 39 mg/dL — ABNORMAL HIGH (ref 6–20)
CO2: 22 mmol/L (ref 22–32)
Calcium: 8.8 mg/dL — ABNORMAL LOW (ref 8.9–10.3)
Chloride: 101 mmol/L (ref 101–111)
Creatinine, Ser: 2.21 mg/dL — ABNORMAL HIGH (ref 0.61–1.24)
GFR calc Af Amer: 36 mL/min — ABNORMAL LOW (ref >60)
GFR calc non Af Amer: 31 mL/min — ABNORMAL LOW (ref >60)
Glucose, Bld: 201 mg/dL — ABNORMAL HIGH (ref 65–99)
Potassium: 3.9 mmol/L (ref 3.5–5.1)
Sodium: 134 mmol/L — ABNORMAL LOW (ref 135–145)

## 2017-07-17 LAB — FIBRINOGEN: Fibrinogen: 488 mg/dL — ABNORMAL HIGH (ref 210–475)

## 2017-07-17 LAB — PROTIME-INR
INR: 1.18
Prothrombin Time: 14.9 seconds (ref 11.4–15.2)

## 2017-07-17 LAB — C-REACTIVE PROTEIN: CRP: 11.1 mg/dL — ABNORMAL HIGH (ref <1.0)

## 2017-07-17 LAB — CK: Total CK: 114 U/L (ref 49–397)

## 2017-07-17 LAB — TECHNOLOGIST SMEAR REVIEW

## 2017-07-17 LAB — PREALBUMIN: Prealbumin: 16.1 mg/dL — ABNORMAL LOW (ref 18–38)

## 2017-07-17 LAB — SEDIMENTATION RATE: Sed Rate: 40 mm/hr — ABNORMAL HIGH (ref 0–16)

## 2017-07-17 MED ORDER — TACROLIMUS 1 MG PO CAPS
1.0000 mg | ORAL_CAPSULE | Freq: Every day | ORAL | Status: DC
  Administered 2017-07-17 – 2017-07-19 (×3): 1 mg via ORAL
  Filled 2017-07-17 (×4): qty 1

## 2017-07-17 MED ORDER — HYDROCODONE-ACETAMINOPHEN 5-325 MG PO TABS
1.0000 | ORAL_TABLET | ORAL | Status: DC | PRN
  Administered 2017-07-18: 2 via ORAL
  Filled 2017-07-17: qty 2

## 2017-07-17 MED ORDER — HEPARIN SODIUM (PORCINE) 5000 UNIT/ML IJ SOLN
5000.0000 [IU] | Freq: Three times a day (TID) | INTRAMUSCULAR | Status: DC
  Administered 2017-07-17 – 2017-07-19 (×4): 5000 [IU] via SUBCUTANEOUS
  Filled 2017-07-17 (×7): qty 1

## 2017-07-17 MED ORDER — DOCUSATE SODIUM 100 MG PO CAPS
100.0000 mg | ORAL_CAPSULE | Freq: Two times a day (BID) | ORAL | Status: DC
  Administered 2017-07-17 – 2017-07-20 (×5): 100 mg via ORAL
  Filled 2017-07-17 (×5): qty 1

## 2017-07-17 MED ORDER — ALBUTEROL SULFATE (2.5 MG/3ML) 0.083% IN NEBU: 2.5000 mg | INHALATION_SOLUTION | Freq: Four times a day (QID) | RESPIRATORY_TRACT | Status: DC | PRN

## 2017-07-17 MED ORDER — TACROLIMUS 1 MG PO CAPS
2.0000 mg | ORAL_CAPSULE | Freq: Every day | ORAL | Status: DC
Start: 2017-07-18 — End: 2017-07-20
  Administered 2017-07-18 – 2017-07-20 (×3): 2 mg via ORAL
  Filled 2017-07-17 (×3): qty 2

## 2017-07-17 MED ORDER — VANCOMYCIN HCL IN DEXTROSE 1-5 GM/200ML-% IV SOLN
1000.0000 mg | INTRAVENOUS | Status: DC
  Administered 2017-07-18 – 2017-07-19 (×2): 1000 mg via INTRAVENOUS
  Filled 2017-07-17 (×2): qty 200

## 2017-07-17 MED ORDER — PANTOPRAZOLE SODIUM 40 MG PO TBEC
40.0000 mg | DELAYED_RELEASE_TABLET | Freq: Every day | ORAL | Status: DC
  Administered 2017-07-18 – 2017-07-20 (×2): 40 mg via ORAL
  Filled 2017-07-17 (×2): qty 1

## 2017-07-17 MED ORDER — METHOCARBAMOL 500 MG PO TABS
500.0000 mg | ORAL_TABLET | Freq: Three times a day (TID) | ORAL | Status: DC
  Administered 2017-07-17 – 2017-07-20 (×7): 500 mg via ORAL
  Filled 2017-07-17 (×7): qty 1

## 2017-07-17 MED ORDER — ACETAMINOPHEN 325 MG PO TABS: 650.0000 mg | ORAL_TABLET | Freq: Four times a day (QID) | ORAL | Status: DC | PRN

## 2017-07-17 MED ORDER — ONDANSETRON HCL 4 MG/2ML IJ SOLN: 4.0000 mg | Freq: Four times a day (QID) | INTRAMUSCULAR | Status: DC | PRN

## 2017-07-17 MED ORDER — VANCOMYCIN HCL 10 G IV SOLR
2500.0000 mg | INTRAVENOUS | Status: AC
  Administered 2017-07-17: 2500 mg via INTRAVENOUS
  Filled 2017-07-17: qty 2000

## 2017-07-17 MED ORDER — ACETAMINOPHEN 650 MG RE SUPP: 650.0000 mg | Freq: Four times a day (QID) | RECTAL | Status: DC | PRN

## 2017-07-17 MED ORDER — ONDANSETRON HCL 4 MG PO TABS: 4.0000 mg | ORAL_TABLET | Freq: Four times a day (QID) | ORAL | Status: DC | PRN

## 2017-07-17 MED ORDER — INSULIN ASPART 100 UNIT/ML ~~LOC~~ SOLN
0.0000 [IU] | Freq: Every day | SUBCUTANEOUS | Status: DC
  Administered 2017-07-17: 3 [IU] via SUBCUTANEOUS
  Administered 2017-07-18: 2 [IU] via SUBCUTANEOUS

## 2017-07-17 MED ORDER — DEXTROSE 5 % IV SOLN
2.0000 g | Freq: Two times a day (BID) | INTRAVENOUS | Status: DC
  Administered 2017-07-18 – 2017-07-19 (×3): 2 g via INTRAVENOUS
  Filled 2017-07-17 (×4): qty 2

## 2017-07-17 MED ORDER — SODIUM CHLORIDE 0.9 % IV SOLN
INTRAVENOUS | Status: DC
  Administered 2017-07-17 – 2017-07-19 (×5): via INTRAVENOUS

## 2017-07-17 MED ORDER — INSULIN ASPART 100 UNIT/ML ~~LOC~~ SOLN
3.0000 [IU] | Freq: Three times a day (TID) | SUBCUTANEOUS | Status: DC
  Administered 2017-07-18 – 2017-07-20 (×6): 3 [IU] via SUBCUTANEOUS

## 2017-07-17 MED ORDER — SODIUM CHLORIDE 0.9 % IV BOLUS (SEPSIS)
500.0000 mL | Freq: Once | INTRAVENOUS | Status: AC
  Administered 2017-07-17: 500 mL via INTRAVENOUS

## 2017-07-17 MED ORDER — DEXTROSE 5 % IV SOLN
2.0000 g | Freq: Once | INTRAVENOUS | Status: AC
  Administered 2017-07-17: 2 g via INTRAVENOUS
  Filled 2017-07-17: qty 2

## 2017-07-17 MED ORDER — METRONIDAZOLE IN NACL 5-0.79 MG/ML-% IV SOLN
500.0000 mg | Freq: Three times a day (TID) | INTRAVENOUS | Status: DC
  Administered 2017-07-17 – 2017-07-19 (×6): 500 mg via INTRAVENOUS
  Filled 2017-07-17 (×7): qty 100

## 2017-07-17 MED ORDER — MYCOPHENOLATE SODIUM 180 MG PO TBEC
360.0000 mg | DELAYED_RELEASE_TABLET | Freq: Two times a day (BID) | ORAL | Status: DC
  Administered 2017-07-17 – 2017-07-20 (×6): 360 mg via ORAL
  Filled 2017-07-17 (×7): qty 2

## 2017-07-17 MED ORDER — INSULIN ASPART 100 UNIT/ML ~~LOC~~ SOLN
0.0000 [IU] | Freq: Three times a day (TID) | SUBCUTANEOUS | Status: DC
  Administered 2017-07-18: 5 [IU] via SUBCUTANEOUS
  Administered 2017-07-18: 11 [IU] via SUBCUTANEOUS
  Administered 2017-07-18: 8 [IU] via SUBCUTANEOUS
  Administered 2017-07-19 (×2): 3 [IU] via SUBCUTANEOUS
  Administered 2017-07-19: 5 [IU] via SUBCUTANEOUS
  Administered 2017-07-20: 3 [IU] via SUBCUTANEOUS

## 2017-07-17 NOTE — ED Triage Notes (Signed)
Patient reports that that he is DM2 and injured righ 2nd toe several weeks ago and now having puts drainage with odor and erythema and swelling now traveling up to mid calf.

## 2017-07-17 NOTE — ED Notes (Signed)
Vascular tech at bedside. °

## 2017-07-17 NOTE — ED Notes (Signed)
Patient transported to X-ray 

## 2017-07-17 NOTE — H&P (Signed)
[facility] History and Physical   Patient: Steven [name] RUE:[id number]   PCP: Corliss Parish, MD DOB: 06/16/1956   DOA: 07/17/2017   DOS: 07/17/2017   DOS: the patient was seen and examined on 07/17/2017  Patient coming from: The patient is coming from home.  Chief Complaint: right toe wound  HPI: Steven GUERRETTE is a 61 y.o. male with Past medical history of type II DM, HTN, history of renal transplant on immunosuppressive medication, HLD, obesity. Patient presented with complaints of 2 weeks history of nonhealing wound on his right second toe.  Patient mentions that he was playing with his dog and the dog jumped on his foot and he injured his right toe.  It was not healing in a few days and therefore he started applying topical antibiotic.  Since last 4 days it was becoming difficult for him to walk around and he started noticing swelling involving the right calf area.  The pain became unbearable today and he decided to come to the hospital.  He had some nausea some dizziness some chills yesterday.  No fever. He has history of diabetes he checks his sugars 4 times a day and his sugars were well controlled. No prior uncontrolled infection.  There is no chest pain abdominal pain no nausea no vomiting right now no diarrhea no.  No recent change in medications.  He has history of renal transplant and follows up with Dr. [name], he mentions that last time his renal function serum creatinine was 1.4.  He denies any change in his urinary habits.  ED Course: Patient was given IV fluids, x-ray of the foot was showing osteomyelitis and patient was started on IV vancomycin and referred for admission.  At his baseline ambulates without any support And is independent for most of his ADL; manages his medication on his own.  Review of Systems: as mentioned in the history of present illness.  All other systems reviewed and are negative.  Past Medical History:  Diagnosis Date  .  Diabetes ([place])   . Diabetic glomerulosclerosis [facility])    [facility] 03/21/12 by Dr. Corliss Parish.  . Hyperlipidemia   . Hyperparathyroidism ([place])   . Hypertension   . Kidney transplant recipient   . Obesity    Past Surgical History:  Procedure Laterality Date  . KIDNEY TRANSPLANT  2006  . NEPHRECTOMY TRANSPLANTED ORGAN     Social History:  reports that  has never smoked. He has quit using smokeless tobacco. His smokeless tobacco use included chew. He reports that he does not drink alcohol or use drugs.  Allergies  Allergen Reactions  . Sulfa Antibiotics      History reviewed. No pertinent family history.   Prior to Admission medications   Medication Sig Start Date End Date Taking? Authorizing Provider  atorvastatin (LIPITOR) 10 MG tablet Take 10 mg by mouth daily.  07/07/17  Yes [provider]  fenofibrate (TRICOR) 48 MG tablet Take 48 mg by mouth daily.   Yes [provider]  furosemide (LASIX) 40 MG tablet Take 40 mg by mouth daily as needed for fluid (swelling).  07/06/17  Yes [provider]  glyBURIDE (DIABETA) 5 MG tablet Take 5 mg by mouth daily with breakfast.   Yes [provider]  insulin aspart (NOVOLOG) 100 UNIT/ML injection Inject 0-35 Units into the skin 3 (three) times daily as needed for high blood sugar. Sliding scale    Yes [provider]  mycophenolate (MYFORTIC) 360 MG  TBEC Take 360 mg by mouth 2 (two) times daily.   Yes [provider]  omeprazole (PRILOSEC) 20 MG capsule Take 20 mg by mouth daily.  07/03/17  Yes [provider]  tacrolimus (PROGRAF) 1 MG capsule Take 1-2 mg by mouth 2 (two) times daily. Take 2 capsules (2 gm) in the am and Take 1 capsule (1 gm) at bedtime   Yes [provider]  albuterol (PROVENTIL HFA;VENTOLIN HFA) 108 (90 BASE) MCG/ACT inhaler Inhale 2 puffs into the lungs every 6 (six) hours as needed for wheezing. 05/17/12 05/17/13  Shawnee Knapp, MD     Physical Exam: Vitals:   07/17/17 1017 07/17/17 1435 07/17/17 1528 07/17/17 1721  BP:  (!) 146/70 (!) 151/69 137/70  Pulse:  85 80 74  Resp:  _0 Temp:      TempSrc:      SpO2:  100% 100% 100%  Weight: 111.1 kg (245 lb)     Height: 5' 11.5" (1.816 m)       General: Alert, Awake and Oriented to Time, Place and Person. Appear in moderate distress, affect appropriate Eyes: PERRL, Conjunctiva normal ENT: Oral Mucosa clear moist. Neck: no JVD, no Abnormal Mass Or lumps Cardiovascular: S1 and S2 Present, no Murmur, Peripheral Pulses Present Respiratory: normal respiratory effort, Bilateral Air entry equal and Decreased, no use of accessory muscle, Clear to Auscultation, no Crackles, no wheezes Abdomen: Bowel Sound present, Soft and no tenderness, no hernia Skin: Redness involving the right dorsum of the foot.  Swelling and blue-black discoloration of the right second toe. Extremities: Right leg pedal edema, right calf tenderness Neurologic: Grossly no focal neuro deficit. Bilaterally Equal motor strength  Labs on Admission:  CBC: Recent Labs  Lab 07/17/17 1630  WBC 6.7  NEUTROABS 5.4  HGB 12.3*  HCT 36.0*  MCV 81.4  PLT 322*   Basic Metabolic Panel: Recent Labs  Lab 07/17/17 1630  NA 134*  K 3.9  CL 101  CO2 22  GLUCOSE 201*  BUN 39*  CREATININE 2.21*  CALCIUM 8.8*   GFR: Estimated Creatinine Clearance: 46 mL/min (A) (by C-G formula based on SCr of 2.21 mg/dL (H)). Liver Function Tests: No results for input(s): AST, ALT, ALKPHOS, BILITOT, PROT, ALBUMIN in the last 168 hours. No results for input(s): LIPASE, AMYLASE in the last 168 hours. No results for input(s): AMMONIA in the last 168 hours. Coagulation Profile: No results for input(s): INR, PROTIME in the last 168 hours. Cardiac Enzymes: No results for input(s): CKTOTAL, CKMB, CKMBINDEX, TROPONINI in the last 168 hours. BNP (last 3 results) No results for input(s): PROBNP in the last 8760  hours. HbA1C: No results for input(s): HGBA1C in the last 72 hours. CBG: No results for input(s): GLUCAP in the last 168 hours. Lipid Profile: No results for input(s): CHOL, HDL, LDLCALC, TRIG, CHOLHDL, LDLDIRECT in the last 72 hours. Thyroid Function Tests: No results for input(s): TSH, T4TOTAL, FREET4, T3FREE, THYROIDAB in the last 72 hours. Anemia Panel: No results for input(s): VITAMINB12, FOLATE, FERRITIN, TIBC, IRON, RETICCTPCT in the last 72 hours. Urine analysis:    Component Value Date/Time   COLORURINE AMBER BIOCHEMICALS MAY BE AFFECTED BY COLOR (A) 07/11/2007 [facility] 07/11/2007 1851   LABSPEC 1.024 07/11/2007 1851   PHURINE 5.5 07/11/2007 1851   GLUCOSEU 500 (A) 07/11/2007 1851   HGBUR NEGATIVE 07/11/2007 1851   BILIRUBINUR SMALL (A) 07/11/2007 [place] 07/11/2007 1851   PROTEINUR 30 (A)  07/11/2007 1851   UROBILINOGEN 1.0 07/11/2007 1851   NITRITE NEGATIVE 07/11/2007 1851   LEUKOCYTESUR TRACE (A) 07/11/2007 1851    Radiological Exams on Admission: Dg Foot Complete Right  Result Date: 07/17/2017 CLINICAL DATA:  Right second toe injury several weeks ago with evidence of infection. Clinical concern of osteomyelitis. EXAM: RIGHT FOOT COMPLETE - 3+ VIEW COMPARISON:  No recent studies in PACs FINDINGS: There are destructive changes of the tip and distal shaft of the distal phalanx of the second toe. There is a large amount of overlying soft tissue swelling. More proximally the second toe is intact. The other toes are intact as are the metatarsals. There are plantar and Achilles region spurs. IMPRESSION: Findings compatible with osteomyelitis of the distal phalanx of the second toe with overlying cellulitis. Electronically Signed   By: David  Martinique M.D.   On: 07/17/2017 16:16   Assessment/Plan 1. Osteomyelitis ([place]) Right leg second toe osteomyelitis. X-ray positive. Check ESR CRP CPK. Ultrasound lower extremity Doppler negative for any  DVT. Given his history of type 2 diabetes mellitus as well as immunosuppressive status I will treat him broadly with IV vancomycin, IV cefepime, IV Flagyl for now. Antibiotic can be narrowed down once cultures are negative. Discussed with [facility] Dr. Erlinda Hong, request the patient to be transferred to [facility]. May require amputation. Requesting orthopedic surgeon to send for cultures to clear margin as well as for antibiotic narrowing. Check with vascular Doppler ABI, case manager diabetes coordinator, wound care consulted. Pain control with as needed muscle relaxant as well as Norco.  2.  Acute kidney injury on chronic kidney disease stage II. Baseline serum creatinine 1.4 per patient. Is a currently serum creatinine 2.2 with BUN of 39. Likely prerenal in the setting of acute infection. Monitor.  3.  Thrombocytopenia. Likely acute in the setting of acute infection. Check INR, smear, fibrinogen.  4.  History of renal transplant. Continuing immunosuppressive medication at present.  5.  Type 2 diabetes mellitus. Patient uses short-acting insulin as well as glyburide at home. We will check hemoglobin A1c. Currently on sliding scale insulin.  6. Body mass index is 33.69 kg/m.  Monitor for now. Dietary consulted.  Nutrition: carb modified diet DVT Prophylaxis: subcutaneous Heparin  Advance goals of care discussion: full code   Consults: orthopedics  Family Communication: family was present at bedside, at the time of interview.  Opportunity was given to ask question and all questions were answered satisfactorily.  Disposition: Admitted as inpatient, med-surge unit. Likely to be discharged home, in 3-4 day.  Author: Berle Mull, MD [facility] Pager: [phone] 07/17/2017  If 7PM-7AM, please contact night-coverage [email or web address] Password [email or web address]

## 2017-07-17 NOTE — Progress Notes (Signed)
A consult was received from an ED physician for Vancomycin per pharmacy dosing.  The patient's profile has been reviewed for ht/wt/allergies/indication/available labs.    A one time order has been placed for Vancomycin 2500mg  IV.  Further antibiotics/pharmacy consults should be ordered by admitting physician if indicated.                       Thank you, Luiz Ochoa 07/17/2017  3:41 PM

## 2017-07-17 NOTE — ED Notes (Signed)
Delay in blood work and IV meds due to patient imaging.

## 2017-07-17 NOTE — ED Notes (Signed)
RN going to obtain labs once patient's IV is completed.

## 2017-07-17 NOTE — Progress Notes (Signed)
Right lower extremity venous duplex has been completed. Negative for DVT.  07/17/17 4:01 PM Carlos Levering RVT

## 2017-07-17 NOTE — ED Notes (Signed)
Bed assigned @ 2052 [facility]

## 2017-07-17 NOTE — ED Notes (Signed)
Patient was informed of the wait time and was asked if anything was needed at this time.

## 2017-07-17 NOTE — Telephone Encounter (Signed)
Pt. called with report of swelling in right lower extremity up to knee.  Reported has had the swelling x 2 weeks; placed on Furosemide. Reported about one week hx of right second toe oozing a clear to blood tinged drainage.  Stated the right LE started having redness and tenderness x 5 days.  Denies fever, but admits to having chills.  Hx of diabetes and kidney transplant.   Called Flow Coordinator re: symptoms and questioned sending to ER vs come in to office.  Was advised to send to the ER with 5 day hx of redness of right LE.  Pt. Advised to go to the ER.  Agrees with plan.    Reason for Disposition . [1] Looks infected (spreading redness, red streak, or pus) AND [2] fever  Answer Assessment - Initial Assessment Questions 1. SYMPTOM: "What's the main symptom you're concerned about?" (e.g., rash, sore, callus, drainage, numbness)     Swelling of right LE, pain in right LE, oozing of right 2nd toe   2. LOCATION: "Where is the  _______ located?" (e.g., foot/toe, top/bottom, left/right)  right LE 3. ONSET: "When did the  ________  start?"    Right LE swelling about 2 weeks ago; right 2nd toe oozing x 1 week 4. PAIN: "Is there any pain?" If so, ask: "How bad is it?" (Scale: 1-10; mild, moderate, severe)    Right LE pain at rest 2/10 and with activitiy @ 7/10 5. CAUSE: "What do you think is causing the symptoms?"     Redness of right LE 6. OTHER SYMPTOMS: "Do you have any other symptoms?" (e.g., fever, weakness)     No fever/ admits to chills  7. PREGNANCY: "Is there any chance you are pregnant?" "When was your last menstrual period?"     n/a  Protocols used: DIABETES - [place]

## 2017-07-17 NOTE — ED Provider Notes (Addendum)
[facility] DEPT Provider Note   CSN: [id number] Arrival date & time: 07/17/17  1004     History   Chief Complaint Chief Complaint  Patient presents with  . right toe infection  . Leg Swelling    right    HPI Steven [name] is a 61 y.o. male.  Pain, swelling, erythema, drainage from right second toe for 2 weeks, getting worse.  Tenderness extends to the calf area.  No fever, sweats, chills.  Patient is status post renal transplant in 2006 at [facility].  He currently sees the nephrology group in [place].  He has known history of diabetes.  Severity of symptoms is moderate to severe.      Past Medical History:  Diagnosis Date  . Diabetes ([place])   . 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 Corliss Parish.  . Hyperlipidemia   . Hyperparathyroidism ([place])   . Hypertension   . Kidney transplant recipient   . Obesity     Patient Active Problem List   Diagnosis Date Noted  . Kidney transplant recipient 05/24/2015  . Diabetes mellitus ([place]) 05/10/2012  . Hyperlipidemia 05/10/2012  . Unspecified essential hypertension 05/10/2012    Past Surgical History:  Procedure Laterality Date  . KIDNEY TRANSPLANT  2006  . NEPHRECTOMY TRANSPLANTED ORGAN         Home Medications    Prior to Admission medications   Medication Sig Start Date End Date Taking? Authorizing Provider  atorvastatin (LIPITOR) 10 MG tablet Take 10 mg by mouth daily.  07/07/17  Yes [provider]  fenofibrate (TRICOR) 48 MG tablet Take 48 mg by mouth daily.   Yes [provider]  furosemide (LASIX) 40 MG tablet Take 40 mg by mouth daily as needed for fluid (swelling).  07/06/17  Yes [provider]  glyBURIDE (DIABETA) 5 MG tablet Take 5 mg by mouth daily with breakfast.   Yes [provider]  insulin aspart (NOVOLOG) 100 UNIT/ML injection Inject 0-35 Units into the skin 3  (three) times daily as needed for high blood sugar. Sliding scale    Yes [provider]  mycophenolate (MYFORTIC) 360 MG TBEC Take 360 mg by mouth 2 (two) times daily.   Yes [provider]  omeprazole (PRILOSEC) 20 MG capsule Take 20 mg by mouth daily.  07/03/17  Yes [provider]  tacrolimus (PROGRAF) 1 MG capsule Take 1-2 mg by mouth 2 (two) times daily. Take 2 capsules (2 gm) in the am and Take 1 capsule (1 gm) at bedtime   Yes [provider]  albuterol (PROVENTIL HFA;VENTOLIN HFA) 108 (90 BASE) MCG/ACT inhaler Inhale 2 puffs into the lungs every 6 (six) hours as needed for wheezing. 05/17/12 05/17/13  Shawnee Knapp, MD  albuterol (PROVENTIL HFA;VENTOLIN HFA) 108 (90 BASE) MCG/ACT inhaler Inhale 2 puffs into the lungs every 4 (four) hours as needed for wheezing or shortness of breath (cough, shortness of breath or wheezing.). Patient not taking: Reported on 07/17/2017 10/02/13   Duane Boston, MD  azithromycin (ZITHROMAX) 250 MG tablet Take 2 tabs PO x 1 dose, then 1 tab PO QD x 4 days Patient not taking: Reported on 07/17/2017 05/17/12   Shawnee Knapp, MD  benzonatate (TESSALON PERLES) 100 MG capsule Take 2 capsules (200 mg total) by mouth 3 (three) times daily as needed for cough. Patient not taking: Reported on 07/17/2017 05/17/12   Shawnee Knapp, MD  cefdinir (  OMNICEF) 300 MG capsule Take 2 capsules (600 mg total) by mouth daily. Patient not taking: Reported on 07/17/2017 10/02/13   Duane Boston, MD  doxycycline (VIBRAMYCIN) 100 MG capsule Take 1 capsule (100 mg total) by mouth 2 (two) times daily. Patient not taking: Reported on 07/17/2017 10/02/13   Duane Boston, MD  glipiZIDE (GLUCOTROL) 5 MG tablet Take 5 mg by mouth every morning.    [provider]  HYDROcodone-acetaminophen (NORCO) 10-325 MG per tablet Take one every 4-6 hours as needed for severe pain. Patient not taking: Reported on 07/17/2017 10/14/11   Posey Boyer, MD  insulin glargine  (LANTUS) 100 UNIT/ML injection Inject 22 Units into the skin daily.    [provider]  omeprazole (PRILOSEC) 40 MG capsule Take 40 mg by mouth daily.    [provider]  oxycodone-acetaminophen (PERCOCET) 2.5-325 MG per tablet Take 1 tablet by mouth every 4 (four) hours as needed.    [provider]    Family History No family history on file.  Social History Social History   Tobacco Use  . Smoking status: Never Smoker  . Smokeless tobacco: Former Systems developer    Types: Chew  Substance Use Topics  . Alcohol use: Yes    Alcohol/week: 0.0 oz    Comment: social drinker  . Drug use: Not on file     Allergies   Sulfa antibiotics   Review of Systems Review of Systems  All other systems reviewed and are negative.    Physical Exam Updated Vital Signs BP (!) 151/69 (BP Location: Right Arm)   Pulse 80   Temp 98.1 F (36.7 C) (Oral)   Resp 16   Ht 5' 11.5" (1.816 m)   Wt 111.1 kg (245 lb)   SpO2 100%   BMI 33.69 kg/m   Physical Exam  Constitutional: He is oriented to person, place, and time. He appears well-developed and well-nourished.  HENT:  Head: Normocephalic and atraumatic.  Eyes: Conjunctivae are normal.  Neck: Neck supple.  Cardiovascular: Normal rate and regular rhythm.  Pulmonary/Chest: Effort normal and breath sounds normal.  Abdominal: Soft. Bowel sounds are normal.  Musculoskeletal: Normal range of motion.  Neurological: He is alert and oriented to person, place, and time.  Skin:  Right foot: Erythematous and necrotic second toe.  Dorsum of the foot is erythematous and tender.  Right calf tender posteriorly  Psychiatric: He has a normal mood and affect. His behavior is normal.  Nursing note and vitals reviewed.    ED Treatments / Results  Labs (all labs ordered are listed, but only abnormal results are displayed) Labs Reviewed  BASIC METABOLIC PANEL - Abnormal; Notable for the following components:      Result Value   Sodium  134 (*)    Glucose, Bld 201 (*)    BUN 39 (*)    Creatinine, Ser 2.21 (*)    Calcium 8.8 (*)    GFR calc non Af Amer 31 (*)    GFR calc Af Amer 36 (*)    All other components within normal limits  CBC WITH DIFFERENTIAL/PLATELET    EKG  EKG Interpretation None       Radiology Dg Foot Complete Right  Result Date: 07/17/2017 CLINICAL DATA:  Right second toe injury several weeks ago with evidence of infection. Clinical concern of osteomyelitis. EXAM: RIGHT FOOT COMPLETE - 3+ VIEW COMPARISON:  No recent studies in PACs FINDINGS: There are destructive changes of the tip and distal shaft  of the distal phalanx of the second toe. There is a large amount of overlying soft tissue swelling. More proximally the second toe is intact. The other toes are intact as are the metatarsals. There are plantar and Achilles region spurs. IMPRESSION: Findings compatible with osteomyelitis of the distal phalanx of the second toe with overlying cellulitis. Electronically Signed   By: David  Martinique M.D.   On: 07/17/2017 16:16    Procedures Procedures (including critical care time)  Medications Ordered in ED Medications  vancomycin (VANCOCIN) 2,500 mg in sodium chloride 0.9 % 500 mL IVPB (2,500 mg Intravenous New Bag/Given 07/17/17 1658)     Initial Impression / Assessment and Plan / ED Course  I have reviewed the triage vital signs and the nursing notes.  Pertinent labs & imaging results that were available during my care of the patient were reviewed by me and considered in my medical decision making (see chart for details).     Patient presents with necrotic right second toe.  Patient is status post renal transplant in 2006.  Creatinine is 2.2.  Ultrasound of right lower extremity shows no DVT.  Plain films of toe reveal osteomyelitis.  Will start vancomycin and admit to general medicine 1740: Discussed with hospitalist. Final Clinical Impressions(s) / ED Diagnoses   Final diagnoses:  Cellulitis of  right foot  Osteomyelitis of right foot, unspecified type ([place])  AKI (acute kidney injury) [facility])    ED Discharge Orders    None       Nat Christen, MD 07/17/17 1639    Nat Christen, MD 07/17/17 Karren Cobble    Nat Christen, MD 07/17/17 1743

## 2017-07-17 NOTE — ED Notes (Signed)
RN going to start IV and told writer she would obtain labs at that time.

## 2017-07-17 NOTE — Progress Notes (Signed)
Pharmacy Antibiotic Note  Steven [name] is a 61 y.o. male admitted on 07/17/2017 with right foot second toe osteomyelitis.  Pharmacy has been consulted for Vancomycin and Cefepime dosing. Patient also placed on Metronidazole per MD.   Plan: Vancomycin 2500mg  IV x 1 already given in ED. Continue with Vancomycin 1g IV q24h for estimated AUC of 461. Plan for Vancomycin levels at steady state, as indicated.  Cefepime 2g IV q12h. Monitor renal function (daily SCr), cultures, clinical course.   Height: 5' 11.5" (181.6 cm) Weight: 245 lb (111.1 kg) IBW/kg (Calculated) : 76.45  Temp (24hrs), Avg:98.1 F (36.7 C), Min:98.1 F (36.7 C), Max:98.1 F (36.7 C)  Recent Labs  Lab 07/17/17 1630  WBC 6.7  CREATININE 2.21*    Estimated Creatinine Clearance: 46 mL/min (A) (by C-G formula based on SCr of 2.21 mg/dL (H)).    Allergies  Allergen Reactions  . Sulfa Antibiotics     Antimicrobials this admission: 12/17 >> Vancomycin >> 12/17 >> Cefepime >> 12/17 >> Metronidazole >>  Dose adjustments this admission: --  Microbiology results: None  Thank you for allowing pharmacy to be a part of this patient's care.   Lindell Spar, PharmD, BCPS Pager: [phone] 07/17/2017 7:37 PM

## 2017-07-17 NOTE — ED Notes (Signed)
ED TO INPATIENT HANDOFF REPORT  Name/Age/Gender Steven [name] 61 y.o. male  Code Status    Code Status Orders  (From admission, onward)        Start     Ordered   07/17/17 1905  Full code  Continuous     07/17/17 1907    Code Status History    Date Active Date Inactive Code Status Order ID Comments User Context   This patient has a current code status but no historical code status.      Home/SNF/Other Home  Chief Complaint Right Leg Swelling  Level of Care/Admitting Diagnosis ED Disposition    ED Disposition Condition [facility] Area: [place] [[id number]]  Level of Care: Med-Surg [16]  Diagnosis: Osteomyelitis [facility]) [[id number]]  Admitting Physician: Lavina Hamman [[id number]]  Attending Physician: Lavina Hamman [[id number]]  Estimated length of stay: past midnight tomorrow  Certification:: I certify this patient will need inpatient services for at least 2 midnights  PT Class (Do Not Modify): Inpatient [101]  PT Acc Code (Do Not Modify): Private [1]       Medical History Past Medical History:  Diagnosis Date  . Diabetes ([place])   . 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 Corliss Parish.  . Hyperlipidemia   . Hyperparathyroidism ([place])   . Hypertension   . Kidney transplant recipient   . Obesity     Allergies Allergies  Allergen Reactions  . Sulfa Antibiotics     IV Location/Drains/Wounds Patient Lines/Drains/Airways Status   Active Line/Drains/Airways    Name:   Placement date:   Placement time:   Site:   Days:   Peripheral IV 07/17/17 Right Antecubital   07/17/17    1632    Antecubital   less than 1          Labs/Imaging Results for orders placed or performed during the hospital encounter of 07/17/17 (from the past 48 hour(s))  CBC with Differential     Status: Abnormal   Collection Time: 07/17/17  4:30 PM  Result Value Ref Range   WBC 6.7 4.0 - 10.5 K/uL   RBC 4.42  4.22 - 5.81 MIL/uL   Hemoglobin 12.3 (L) 13.0 - 17.0 g/dL   HCT 36.0 (L) 39.0 - 52.0 %   MCV 81.4 78.0 - 100.0 fL   MCH 27.8 26.0 - 34.0 pg   MCHC 34.2 30.0 - 36.0 g/dL   RDW 13.7 11.5 - 15.5 %   Platelets 112 (L) 150 - 400 K/uL    Comment: REPEATED TO VERIFY SPECIMEN CHECKED FOR CLOTS PLATELET COUNT CONFIRMED BY SMEAR    Neutrophils Relative % 81 %   Neutro Abs 5.4 1.7 - 7.7 K/uL   Lymphocytes Relative 8 %   Lymphs Abs 0.6 (L) 0.7 - 4.0 K/uL   Monocytes Relative 10 %   Monocytes Absolute 0.7 0.1 - 1.0 K/uL   Eosinophils Relative 1 %   Eosinophils Absolute 0.1 0.0 - 0.7 K/uL   Basophils Relative 0 %   Basophils Absolute 0.0 0.0 - 0.1 K/uL  Basic metabolic panel     Status: Abnormal   Collection Time: 07/17/17  4:30 PM  Result Value Ref Range   Sodium 134 (L) 135 - 145 mmol/L   Potassium 3.9 3.5 - 5.1 mmol/L   Chloride 101 101 - 111 mmol/L   CO2 22 22 - 32 mmol/L   Glucose, Bld 201 (H) 65 - 99  mg/dL   BUN 39 (H) 6 - 20 mg/dL   Creatinine, Ser 2.21 (H) 0.61 - 1.24 mg/dL   Calcium 8.8 (L) 8.9 - 10.3 mg/dL   GFR calc non Af Amer 31 (L) >60 mL/min   GFR calc Af Amer 36 (L) >60 mL/min    Comment: (NOTE) The eGFR has been calculated using the CKD EPI equation. This calculation has not been validated in all clinical situations. eGFR's persistently <60 mL/min signify possible Chronic Kidney Disease.    Anion gap 11 5 - 15   Dg Foot Complete Right  Result Date: 07/17/2017 CLINICAL DATA:  Right second toe injury several weeks ago with evidence of infection. Clinical concern of osteomyelitis. EXAM: RIGHT FOOT COMPLETE - 3+ VIEW COMPARISON:  No recent studies in PACs FINDINGS: There are destructive changes of the tip and distal shaft of the distal phalanx of the second toe. There is a large amount of overlying soft tissue swelling. More proximally the second toe is intact. The other toes are intact as are the metatarsals. There are plantar and Achilles region spurs. IMPRESSION:  Findings compatible with osteomyelitis of the distal phalanx of the second toe with overlying cellulitis. Electronically Signed   By: David  Martinique M.D.   On: 07/17/2017 16:16    Pending Labs Unresulted Labs (From admission, onward)   Start     Ordered   07/18/17 0500  Creatinine, serum  Daily,   R     07/17/17 1933   07/17/17 1920  Technologist smear review  Once,   R     07/17/17 1919   07/17/17 [place]  Once,   R     07/17/17 1919   07/17/17 1919  Fibrinogen  Once,   R     07/17/17 1919   07/17/17 1907  HIV antibody (Routine Testing)  Once,   R     07/17/17 1907   07/17/17 1906  CK  Once,   R     07/17/17 1907   07/17/17 1904  Hemoglobin A1c  Once,   R    Comments:  To assess prior glycemic control    07/17/17 1907   07/17/17 1903  Sedimentation rate  Once,   R     07/17/17 1907   07/17/17 1903  C-reactive protein  Once,   R     07/17/17 1907   07/17/17 1903  Prealbumin  Once,   R     07/17/17 1907      Vitals/Pain Today's Vitals   07/17/17 1528 07/17/17 1721 07/17/17 1800 07/17/17 1900  BP: (!) 151/69 137/70 135/69 120/60  Pulse: 80 74 79 83  Resp: _0 Temp:      TempSrc:      SpO2: 100% 100% 100% 100%  Weight:      Height:      PainSc:        Isolation Precautions No active isolations  Medications Medications  albuterol (PROVENTIL) (2.5 MG/3ML) 0.083% nebulizer solution 2.5 mg (not administered)  mycophenolate (MYFORTIC) EC tablet 360 mg (not administered)  pantoprazole (PROTONIX) EC tablet 40 mg (not administered)  tacrolimus (PROGRAF) capsule 2 mg (not administered)  insulin aspart (novoLOG) injection 0-15 Units (not administered)  insulin aspart (novoLOG) injection 0-5 Units (not administered)  insulin aspart (novoLOG) injection 3 Units (not administered)  metroNIDAZOLE (FLAGYL) IVPB 500 mg (not administered)  heparin injection 5,000 Units (not administered)  0.9 %  sodium chloride infusion ( Intravenous New Bag/Given 07/17/17  2059)   acetaminophen (TYLENOL) tablet 650 mg (not administered)    Or  acetaminophen (TYLENOL) suppository 650 mg (not administered)  HYDROcodone-acetaminophen (NORCO/VICODIN) 5-325 MG per tablet 1-2 tablet (not administered)  docusate sodium (COLACE) capsule 100 mg (not administered)  ondansetron (ZOFRAN) tablet 4 mg (not administered)    Or  ondansetron (ZOFRAN) injection 4 mg (not administered)  methocarbamol (ROBAXIN) tablet 500 mg (not administered)  tacrolimus (PROGRAF) capsule 1 mg (not administered)  ceFEPIme (MAXIPIME) 2 g in dextrose 5 % 50 mL IVPB (2 g Intravenous New Bag/Given 07/17/17 2059)  ceFEPIme (MAXIPIME) 2 g in dextrose 5 % 50 mL IVPB (not administered)  vancomycin (VANCOCIN) IVPB 1000 mg/200 mL premix (not administered)  vancomycin (VANCOCIN) 2,500 mg in sodium chloride 0.9 % 500 mL IVPB (0 mg Intravenous Stopped 07/17/17 1929)  sodium chloride 0.9 % bolus 500 mL (0 mLs Intravenous Stopped 07/17/17 2051)    Mobility walks

## 2017-07-18 ENCOUNTER — Other Ambulatory Visit: Payer: Self-pay

## 2017-07-18 ENCOUNTER — Encounter (HOSPITAL_COMMUNITY): Payer: BC Managed Care – PPO

## 2017-07-18 DIAGNOSIS — N183 Chronic kidney disease, stage 3 (moderate): Secondary | ICD-10-CM

## 2017-07-18 DIAGNOSIS — Z794 Long term (current) use of insulin: Secondary | ICD-10-CM

## 2017-07-18 DIAGNOSIS — E1122 Type 2 diabetes mellitus with diabetic chronic kidney disease: Secondary | ICD-10-CM

## 2017-07-18 DIAGNOSIS — M869 Osteomyelitis, unspecified: Secondary | ICD-10-CM

## 2017-07-18 DIAGNOSIS — Z6836 Body mass index (BMI) 36.0-36.9, adult: Secondary | ICD-10-CM

## 2017-07-18 DIAGNOSIS — Z940 Kidney transplant status: Secondary | ICD-10-CM

## 2017-07-18 DIAGNOSIS — N179 Acute kidney failure, unspecified: Secondary | ICD-10-CM

## 2017-07-18 DIAGNOSIS — E785 Hyperlipidemia, unspecified: Secondary | ICD-10-CM

## 2017-07-18 DIAGNOSIS — E6609 Other obesity due to excess calories: Secondary | ICD-10-CM

## 2017-07-18 LAB — GLUCOSE, CAPILLARY
Glucose-Capillary: 286 mg/dL — ABNORMAL HIGH (ref 65–99)
Glucose-Capillary: 304 mg/dL — ABNORMAL HIGH (ref 65–99)
Glucose-Capillary: 201 mg/dL — ABNORMAL HIGH (ref 65–99)
Glucose-Capillary: 203 mg/dL — ABNORMAL HIGH (ref 65–99)

## 2017-07-18 LAB — COMPREHENSIVE METABOLIC PANEL
ALT: 16 U/L — ABNORMAL LOW (ref 17–63)
AST: 12 U/L — ABNORMAL LOW (ref 15–41)
Albumin: 3 g/dL — ABNORMAL LOW (ref 3.5–5.0)
Alkaline Phosphatase: 66 U/L (ref 38–126)
Anion gap: 10 (ref 5–15)
BUN: 37 mg/dL — ABNORMAL HIGH (ref 6–20)
CO2: 21 mmol/L — ABNORMAL LOW (ref 22–32)
Calcium: 8.3 mg/dL — ABNORMAL LOW (ref 8.9–10.3)
Chloride: 104 mmol/L (ref 101–111)
Creatinine, Ser: 2.33 mg/dL — ABNORMAL HIGH (ref 0.61–1.24)
GFR calc Af Amer: 34 mL/min — ABNORMAL LOW (ref >60)
GFR calc non Af Amer: 29 mL/min — ABNORMAL LOW (ref >60)
Glucose, Bld: 314 mg/dL — ABNORMAL HIGH (ref 65–99)
Potassium: 4.1 mmol/L (ref 3.5–5.1)
Sodium: 135 mmol/L (ref 135–145)
Total Bilirubin: 1.1 mg/dL (ref 0.3–1.2)
Total Protein: 5.8 g/dL — ABNORMAL LOW (ref 6.5–8.1)

## 2017-07-18 LAB — CBC WITH DIFFERENTIAL/PLATELET
Basophils Absolute: 0 10*3/uL (ref 0.0–0.1)
Basophils Relative: 0 %
Eosinophils Absolute: 0 10*3/uL (ref 0.0–0.7)
Eosinophils Relative: 1 %
HCT: 32.2 % — ABNORMAL LOW (ref 39.0–52.0)
Hemoglobin: 10.7 g/dL — ABNORMAL LOW (ref 13.0–17.0)
Lymphocytes Relative: 17 %
Lymphs Abs: 0.7 10*3/uL (ref 0.7–4.0)
MCH: 27.2 pg (ref 26.0–34.0)
MCHC: 33.2 g/dL (ref 30.0–36.0)
MCV: 81.9 fL (ref 78.0–100.0)
Monocytes Absolute: 0.4 10*3/uL (ref 0.1–1.0)
Monocytes Relative: 10 %
Neutro Abs: 3.1 10*3/uL (ref 1.7–7.7)
Neutrophils Relative %: 72 %
Platelets: 86 10*3/uL — ABNORMAL LOW (ref 150–400)
RBC: 3.93 MIL/uL — ABNORMAL LOW (ref 4.22–5.81)
RDW: 13.9 % (ref 11.5–15.5)
WBC: 4.2 10*3/uL (ref 4.0–10.5)

## 2017-07-18 LAB — MAGNESIUM: Magnesium: 1.5 mg/dL — ABNORMAL LOW (ref 1.7–2.4)

## 2017-07-18 LAB — HIV ANTIBODY (ROUTINE TESTING W REFLEX): HIV Screen 4th Generation wRfx: NONREACTIVE

## 2017-07-18 MED ORDER — INSULIN GLARGINE 100 UNIT/ML ~~LOC~~ SOLN: 12.0000 [IU] | Freq: Every day | SUBCUTANEOUS | Status: DC

## 2017-07-18 MED ORDER — JUVEN PO PACK
1.0000 | PACK | Freq: Two times a day (BID) | ORAL | Status: DC
  Administered 2017-07-20: 1 via ORAL
  Filled 2017-07-18 (×4): qty 1

## 2017-07-18 MED ORDER — ATORVASTATIN CALCIUM 10 MG PO TABS
10.0000 mg | ORAL_TABLET | Freq: Every day | ORAL | Status: DC
  Administered 2017-07-19: 10 mg via ORAL
  Filled 2017-07-18: qty 1

## 2017-07-18 MED ORDER — INSULIN GLARGINE 100 UNIT/ML ~~LOC~~ SOLN
15.0000 [IU] | Freq: Every day | SUBCUTANEOUS | Status: DC
  Administered 2017-07-19 – 2017-07-20 (×2): 15 [IU] via SUBCUTANEOUS
  Filled 2017-07-18 (×2): qty 0.15

## 2017-07-18 MED ORDER — FENOFIBRATE 54 MG PO TABS
54.0000 mg | ORAL_TABLET | Freq: Every day | ORAL | Status: DC
  Administered 2017-07-20: 54 mg via ORAL
  Filled 2017-07-18 (×2): qty 1

## 2017-07-18 MED ORDER — ADULT MULTIVITAMIN W/MINERALS CH
1.0000 | ORAL_TABLET | Freq: Every day | ORAL | Status: DC
  Administered 2017-07-18: 1 via ORAL
  Filled 2017-07-18: qty 1

## 2017-07-18 NOTE — Progress Notes (Signed)
Initial Nutrition Assessment  DOCUMENTATION CODES:   Obesity unspecified  INTERVENTION:   -Juven BID -MVI daily  NUTRITION DIAGNOSIS:   Increased nutrient needs related to wound healing as evidenced by estimated needs.  GOAL:   Patient will meet greater than or equal to 90% of their needs  MONITOR:   PO intake, Supplement acceptance, Labs, Weight trends, Skin, I & O's  REASON FOR ASSESSMENT:   Consult Wound healing  ASSESSMENT:   GRIER VU is a 61 y.o. male who presents with right 2nd toe osteo and ulcer for the last few weeks.  He has h/o DM, renal transplant, HTN.  He checks his sugars regularly and states his DM is well controlled.  Pt admitted with rt 2nd toe osteomyelitis.   Reviewed orthopedics note; pt scheduled for rt 2nd doe amputation on 07/19/17.   Spoke with pt at bedside, who reports good appetite PTA. He generally consumes 2-3 meals per day, although he may skip a meal depending on his work schedule. He reports that he tries to be compliant with DM, however, has noted increased blood sugars over the past 1-2 weeks related to infection. He reports CBGS usually run between 150-200 (pt checks 3 times daily). DM is managed by nephrologist (Dr. [name]). He is unsure of last Hgb A1c, bit notes it had increased at last appointment. Home MD medications are lantus 22 units daily, novolog 0-35 units TID per SSI, anf glyburide 5 mg daily.   Pt denies any weight loss.  Discussed importance of increased protein intake and improved glycemic control to assist with healing.  Labs reviewed: CBGS: 286-304 (inpatient orders for glycemic control are 0-15 units insulin aspart TID with meals, 0-5 units insulin aspart q HS, 3 units insulin aspart TID with meals).   NUTRITION - FOCUSED PHYSICAL EXAM:    Most Recent Value  Orbital Region  No depletion  Upper Arm Region  No depletion  Thoracic and Lumbar Region  No depletion  Buccal Region  No depletion  Temple  Region  No depletion  Clavicle Bone Region  No depletion  Clavicle and Acromion Bone Region  No depletion  Scapular Bone Region  No depletion  Dorsal Hand  No depletion  Patellar Region  No depletion  Anterior Thigh Region  No depletion  Posterior Calf Region  No depletion  Edema (RD Assessment)  Mild  Hair  Reviewed  Eyes  Reviewed  Mouth  Reviewed  Skin  Reviewed  Nails  Reviewed       Diet Order:  Diet Carb Modified Fluid consistency: Thin; Room service appropriate? Yes  EDUCATION NEEDS:   Education needs have been addressed  Skin:  Skin Assessment: Skin Integrity Issues: Skin Integrity Issues:: Other (Comment) Other: rt toe osteomyeltitis, bilateral leg cellulitis  Last BM:  PTA  Height:   Ht Readings from Last 1 Encounters:  07/17/17 5' 11.5" (1.816 m)    Weight:   Wt Readings from Last 1 Encounters:  07/17/17 264 lb 1.8 oz (119.8 kg)    Ideal Body Weight:  79.2 kg  BMI:  Body mass index is 36.32 kg/m.  Estimated Nutritional Needs:   Kcal:  2100-2300  Protein:  115-130 grams  Fluid:  2.1-2.3 L    Jacklyne Baik A. Jimmye Norman, RD, LDN, CDE Pager: [phone] After hours Pager: [phone]

## 2017-07-18 NOTE — Progress Notes (Addendum)
Inpatient Diabetes Program Recommendations  AACE/ADA: New Consensus Statement on Inpatient Glycemic Control (2015)  Target Ranges:  Prepandial:   less than 140 mg/dL      Peak postprandial:   less than 180 mg/dL (1-2 hours)      Critically ill patients:  140 - 180 mg/dL   Results for Steven [name], Steven [name] (MRN [id number]) as of 07/18/2017 10:10  Ref. Range 07/17/2017 22:25 07/18/2017 06:16  Glucose-Capillary Latest Ref Range: 65 - 99 mg/dL 292 (H) 286 (H)   Admit with: R 2nd Toe Osteomyelitis  History: DM, Renal Transplant  Home DM Meds: Lantus 22 units daily       Novolog 0-35 units TID per SSI       Glyburide 5 mg daily  Current Insulin Orders: Novolog Moderate Correction Scale/ SSI (0-15 units) TID AC + HS                     Novolog 3 units TID         MD- Please consider starting Lantus 18 units daily (80% total home dose Lantus)  Please start this AM       --Will follow patient during hospitalization--  Wyn Quaker RN, MSN, CDE Diabetes Coordinator Inpatient Glycemic Control Team Team Pager: [phone] (8a-5p)

## 2017-07-18 NOTE — Progress Notes (Signed)
[facility] PROGRESS NOTE  Steven [name] OVZ:[id number] DOB: 01/16/1956 DOA: 07/17/2017 PCP: Corliss Parish, MD  Interim summary and HPI 61 y.o. male with Past medical history of type II DM, HTN, history of renal transplant on immunosuppressive medication, HLD, obesity. Patient presented with complaints of 2 weeks history of nonhealing wound on his right second toe.  Patient mentions that he was playing with his dog and the dog jumped on his foot and he injured his right toe.  It was not healing in a few days and therefore he started applying topical antibiotic.  Since last 4 days it was becoming difficult for him to walk around and he started noticing swelling involving the right calf area.  The pain became unbearable today and he decided to come to the hospital.  He had some nausea some dizziness some chills yesterday  ED work up demonstrated 2nd right toe osteomyelitis.  Assessment/Plan: 1-2nd right toe osteomyelitis  -positive findings on x-ray -orthopedic service consulted -continue IV antibiotics for now; patient is non-toxic and in no acute distress. -continue PRN analgesics -following ortho rec's, anticipating amputation on 12/19  2-acute on chronic renal failure -patient with hx of renal transplant and stage 3 CKD -improved with IVF's, will continue  -will minimize nephrotoxic agents and adjust meds per pharmacy for his kidney function  -continue prograf and Myfortic  3-type 2 diabetes with hyperglycemia and CKD  -will continue SSI -holding oral hypoglycemic regimen  -resume lantus at 15 units -follow CBG's  4-HLD -continue statins and tricor   5-obesity: class 2 -Body mass index is 36.32 kg/m. -low calorie diet and exercise discussed with patient   6-GERD -continue PPI   Code Status: Full code Family Communication: friend at bedside  Disposition Plan: to  Be determine after surgical intervention.   Consultants:  Orthopedic surgery    Procedures:  anticipated second right toe amputation on 12/19  Antibiotics:  Cefepime and vancomycin   HPI/Subjective: Afebrile, no CP, no SOB, no nausea or vomiting. Patient in no distress and ready/accepting needs for surgical procedure.   Objective: Vitals:   07/18/17 1747 07/18/17 2049  BP: (!) 116/51 (!) 139/56  Pulse: 65 71  Resp: 20 20  Temp: 98.7 F (37.1 C) 98.3 F (36.8 C)  SpO2: 98% 99%    Intake/Output Summary (Last 24 hours) at 07/18/2017 2249 Last data filed at 07/18/2017 1600 Gross per 24 hour  Intake 2927.08 ml  Output -  Net 2927.08 ml   Filed Weights   07/17/17 1017 07/17/17 2228  Weight: 111.1 kg (245 lb) 119.8 kg (264 lb 1.8 oz)    Exam:   General: afebrile, no CP, no SOB, in no distress.  Cardiovascular: S1 and S2, no rubs, no gallops, positive SEM  Respiratory: CTA bilaterally  Abdomen: soft, NT, ND, positive BS  Musculoskeletal: no cyanosis or edema; 2nd right toe with gangrenous changes, no odor, no drainage.  Data Reviewed: Basic Metabolic Panel: Recent Labs  Lab 07/17/17 1630 07/18/17 0602  NA 134* 135  K 3.9 4.1  CL 101 104  CO2 22 21*  GLUCOSE 201* 314*  BUN 39* 37*  CREATININE 2.21* 2.33*  CALCIUM 8.8* 8.3*  MG  --  1.5*   Liver Function Tests: Recent Labs  Lab 07/18/17 0602  AST 12*  ALT 16*  ALKPHOS 66  BILITOT 1.1  PROT 5.8*  ALBUMIN 3.0*   CBC: Recent Labs  Lab 07/17/17 1630 07/18/17 0602  WBC 6.7 4.2  NEUTROABS 5.4 3.1  HGB  12.3* 10.7*  HCT 36.0* 32.2*  MCV 81.4 81.9  PLT 112* 86*   Cardiac Enzymes: Recent Labs  Lab 07/17/17 2055  CKTOTAL 114    CBG: Recent Labs  Lab 07/17/17 2225 07/18/17 0616 07/18/17 1121 07/18/17 1650 07/18/17 2102  GLUCAP 292* 286* 304* 201* 203*    Studies: Dg Foot Complete Right  Result Date: 07/17/2017 CLINICAL DATA:  Right second toe injury several weeks ago with evidence of infection. Clinical concern of osteomyelitis. EXAM: RIGHT FOOT COMPLETE  - 3+ VIEW COMPARISON:  No recent studies in PACs FINDINGS: There are destructive changes of the tip and distal shaft of the distal phalanx of the second toe. There is a large amount of overlying soft tissue swelling. More proximally the second toe is intact. The other toes are intact as are the metatarsals. There are plantar and Achilles region spurs. IMPRESSION: Findings compatible with osteomyelitis of the distal phalanx of the second toe with overlying cellulitis. Electronically Signed   By: David  Martinique M.D.   On: 07/17/2017 16:16    Scheduled Meds: . docusate sodium  100 mg Oral BID  . heparin  5,000 Units Subcutaneous Q8H  . insulin aspart  0-15 Units Subcutaneous TID WC  . insulin aspart  0-5 Units Subcutaneous QHS  . insulin aspart  3 Units Subcutaneous TID WC  . methocarbamol  500 mg Oral TID  . multivitamin with minerals  1 tablet Oral Daily  . mycophenolate  360 mg Oral BID  . [START ON 07/19/2017] nutrition supplement (JUVEN)  1 packet Oral BID BM  . pantoprazole  40 mg Oral Daily  . tacrolimus  1 mg Oral QHS  . tacrolimus  2 mg Oral Daily   Continuous Infusions: . sodium chloride 125 mL/hr at 07/18/17 0815  . ceFEPime (MAXIPIME) IV 2 g (07/18/17 2127)  . metronidazole 500 mg (07/18/17 2128)  . vancomycin Stopped (07/18/17 1701)    Principal Problem:   Osteomyelitis ([place]) Active Problems:   Diabetes mellitus ([place])   Hyperlipidemia   Essential hypertension   Kidney transplant recipient   Acute on chronic kidney failure ([place])    Time spent:25 minutes    Barton Dubois  [facility] Pager [phone]. If 7PM-7AM, please contact night-coverage at [email or web address], password [facility] 07/18/2017, 10:49 PM  LOS: 1 day

## 2017-07-18 NOTE — Consult Note (Addendum)
WOC consult requested prior to ortho service involvement.  Dr Erlinda Hong has seen the patient and plans to take to the OR tomorrow according to the progress notes.  Please refer to their team for further questions and plan of care. Please re-consult if further assistance is needed.  Thank-you,  Julien Girt MSN, [place], [place], [place], [place]

## 2017-07-18 NOTE — Consult Note (Signed)
ORTHOPAEDIC CONSULTATION  REQUESTING PHYSICIAN: Barton Dubois, MD  Chief Complaint: right 2nd toe osteo  HPI: Steven [name] is a 61 y.o. male who presents with right 2nd toe osteo and ulcer for the last few weeks.  He has h/o DM, renal transplant, HTN.  He checks his sugars regularly and states his DM is well controlled.  Denies any f/c/n/v.  Ortho consulted for management of the toe osteomyelitis.  Past Medical History:  Diagnosis Date  . Diabetes ([place])   . 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 Corliss Parish.  . Hyperlipidemia   . Hyperparathyroidism ([place])   . Hypertension   . Kidney transplant recipient   . Obesity    Past Surgical History:  Procedure Laterality Date  . KIDNEY TRANSPLANT  2006  . NEPHRECTOMY TRANSPLANTED ORGAN     Social History   Socioeconomic History  . Marital status: Married    Spouse name: None  . Number of children: None  . Years of education: None  . Highest education level: None  Social Needs  . Financial resource strain: None  . Food insecurity - worry: None  . Food insecurity - inability: None  . Transportation needs - medical: None  . Transportation needs - non-medical: None  Occupational History  . None  Tobacco Use  . Smoking status: Never Smoker  . Smokeless tobacco: Former Systems developer    Types: Chew  Substance and Sexual Activity  . Alcohol use: No    Alcohol/week: 0.0 oz    Frequency: Never  . Drug use: No  . Sexual activity: None  Other Topics Concern  . None  Social History Narrative  . None   History reviewed. No pertinent family history. - negative except otherwise stated in the family history section Allergies  Allergen Reactions  . Sulfa Antibiotics    Prior to Admission medications   Medication Sig Start Date End Date Taking? Authorizing Provider  atorvastatin (LIPITOR) 10 MG tablet Take 10 mg by mouth daily.  07/07/17  Yes [provider]  fenofibrate  (TRICOR) 48 MG tablet Take 48 mg by mouth daily.   Yes [provider]  furosemide (LASIX) 40 MG tablet Take 40 mg by mouth daily as needed for fluid (swelling).  07/06/17  Yes [provider]  glyBURIDE (DIABETA) 5 MG tablet Take 5 mg by mouth daily with breakfast.   Yes [provider]  insulin aspart (NOVOLOG) 100 UNIT/ML injection Inject 0-35 Units into the skin 3 (three) times daily as needed for high blood sugar. Sliding scale    Yes [provider]  mycophenolate (MYFORTIC) 360 MG TBEC Take 360 mg by mouth 2 (two) times daily.   Yes [provider]  omeprazole (PRILOSEC) 20 MG capsule Take 20 mg by mouth daily.  07/03/17  Yes [provider]  tacrolimus (PROGRAF) 1 MG capsule Take 1-2 mg by mouth 2 (two) times daily. Take 2 capsules (2 gm) in the am and Take 1 capsule (1 gm) at bedtime   Yes [provider]  albuterol (PROVENTIL HFA;VENTOLIN HFA) 108 (90 BASE) MCG/ACT inhaler Inhale 2 puffs into the lungs every 6 (six) hours as needed for wheezing. 05/17/12 05/17/13  Shawnee Knapp, MD   Dg Foot Complete Right  Result Date: 07/17/2017 CLINICAL DATA:  Right second toe injury several weeks ago with evidence of infection. Clinical concern of osteomyelitis. EXAM: RIGHT FOOT COMPLETE - 3+ VIEW COMPARISON:  No recent studies in PACs  FINDINGS: There are destructive changes of the tip and distal shaft of the distal phalanx of the second toe. There is a large amount of overlying soft tissue swelling. More proximally the second toe is intact. The other toes are intact as are the metatarsals. There are plantar and Achilles region spurs. IMPRESSION: Findings compatible with osteomyelitis of the distal phalanx of the second toe with overlying cellulitis. Electronically Signed   By: David  Martinique M.D.   On: 07/17/2017 16:16   - pertinent xrays, CT, MRI studies were reviewed and independently interpreted  Positive ROS: All other systems have been  reviewed and were otherwise negative with the exception of those mentioned in the HPI and as above.  Physical Exam: General: Alert, no acute distress Cardiovascular: No pedal edema Respiratory: No cyanosis, no use of accessory musculature GI: No organomegaly, abdomen is soft and non-tender Skin: No lesions in the area of chief complaint Neurologic: Sensation intact distally Psychiatric: Patient is competent for consent with normal mood and affect Lymphatic: No axillary or cervical lymphadenopathy  MUSCULOSKELETAL:  - slightly decreased sensation in the foot - 2+ DP pulse       Assessment: Right 2nd toe osteo and ulcer  Plan: - xrays c/w osteo with bony erosion - will plan for 2nd toe amp tomorrow - consent obtained  Thank you for the consult and the opportunity to see Steven [name]. Eduard Roux, MD [place] 7:43 AM

## 2017-07-19 ENCOUNTER — Encounter (HOSPITAL_COMMUNITY): Payer: Self-pay | Admitting: *Deleted

## 2017-07-19 ENCOUNTER — Inpatient Hospital Stay (HOSPITAL_COMMUNITY): Payer: BC Managed Care – PPO

## 2017-07-19 ENCOUNTER — Inpatient Hospital Stay (HOSPITAL_COMMUNITY): Payer: BC Managed Care – PPO | Admitting: Certified Registered Nurse Anesthetist

## 2017-07-19 ENCOUNTER — Encounter (HOSPITAL_COMMUNITY)
Admission: EM | Disposition: A | Discharge status: Home Health | Payer: Self-pay | Source: Home / Self Care | Attending: Internal Medicine

## 2017-07-19 DIAGNOSIS — L0390 Cellulitis, unspecified: Secondary | ICD-10-CM

## 2017-07-19 DIAGNOSIS — M86171 Other acute osteomyelitis, right ankle and foot: Secondary | ICD-10-CM

## 2017-07-19 HISTORY — PX: AMPUTATION: SHX166

## 2017-07-19 LAB — SURGICAL PCR SCREEN
MRSA, PCR: NEGATIVE
Staphylococcus aureus: NEGATIVE

## 2017-07-19 LAB — GLUCOSE, CAPILLARY
Glucose-Capillary: 213 mg/dL — ABNORMAL HIGH (ref 65–99)
Glucose-Capillary: 198 mg/dL — ABNORMAL HIGH (ref 65–99)
Glucose-Capillary: 153 mg/dL — ABNORMAL HIGH (ref 65–99)
Glucose-Capillary: 161 mg/dL — ABNORMAL HIGH (ref 65–99)
Glucose-Capillary: 182 mg/dL — ABNORMAL HIGH (ref 65–99)

## 2017-07-19 LAB — HEMOGLOBIN A1C
Hgb A1c MFr Bld: 9.3 % — ABNORMAL HIGH (ref 4.8–5.6)
Mean Plasma Glucose: 220 mg/dL

## 2017-07-19 LAB — CREATININE, SERUM
Creatinine, Ser: 2.3 mg/dL — ABNORMAL HIGH (ref 0.61–1.24)
GFR calc Af Amer: 34 mL/min — ABNORMAL LOW (ref >60)
GFR calc non Af Amer: 29 mL/min — ABNORMAL LOW (ref >60)

## 2017-07-19 SURGERY — AMPUTATION DIGIT
Anesthesia: Choice | Laterality: Right

## 2017-07-19 MED ORDER — ONDANSETRON HCL 4 MG/2ML IJ SOLN
INTRAMUSCULAR | Status: DC | PRN
  Administered 2017-07-19: 4 mg via INTRAVENOUS

## 2017-07-19 MED ORDER — BUPIVACAINE HCL (PF) 0.25 % IJ SOLN
INTRAMUSCULAR | Status: AC
  Filled 2017-07-19: qty 30

## 2017-07-19 MED ORDER — LIDOCAINE 2% (20 MG/ML) 5 ML SYRINGE
INTRAMUSCULAR | Status: AC
Start: 2017-07-19 — End: 2017-07-19
  Filled 2017-07-19: qty 5

## 2017-07-19 MED ORDER — ARTIFICIAL TEARS OPHTHALMIC OINT
TOPICAL_OINTMENT | OPHTHALMIC | Status: AC
  Filled 2017-07-19: qty 3.5

## 2017-07-19 MED ORDER — PROPOFOL 10 MG/ML IV BOLUS
INTRAVENOUS | Status: DC | PRN
  Administered 2017-07-19: 200 mg via INTRAVENOUS

## 2017-07-19 MED ORDER — FENTANYL CITRATE (PF) 250 MCG/5ML IJ SOLN
INTRAMUSCULAR | Status: AC
  Filled 2017-07-19: qty 5

## 2017-07-19 MED ORDER — ONDANSETRON HCL 4 MG/2ML IJ SOLN
INTRAMUSCULAR | Status: AC
  Filled 2017-07-19: qty 2

## 2017-07-19 MED ORDER — SODIUM CHLORIDE 0.9 % IV SOLN: INTRAVENOUS | Status: DC

## 2017-07-19 MED ORDER — FENTANYL CITRATE (PF) 100 MCG/2ML IJ SOLN: 25.0000 ug | INTRAMUSCULAR | Status: DC | PRN

## 2017-07-19 MED ORDER — CEFAZOLIN SODIUM-DEXTROSE 2-4 GM/100ML-% IV SOLN: 2.0000 g | Freq: Once | INTRAVENOUS | Status: DC

## 2017-07-19 MED ORDER — DOXYCYCLINE HYCLATE 100 MG PO TABS
100.0000 mg | ORAL_TABLET | Freq: Two times a day (BID) | ORAL | Status: DC
  Administered 2017-07-19 – 2017-07-20 (×2): 100 mg via ORAL
  Filled 2017-07-19 (×2): qty 1

## 2017-07-19 MED ORDER — LIDOCAINE 2% (20 MG/ML) 5 ML SYRINGE
INTRAMUSCULAR | Status: DC | PRN
  Administered 2017-07-19: 100 mg via INTRAVENOUS

## 2017-07-19 MED ORDER — PROPOFOL 10 MG/ML IV BOLUS
INTRAVENOUS | Status: AC
  Filled 2017-07-19: qty 20

## 2017-07-19 MED ORDER — METOCLOPRAMIDE HCL 5 MG/ML IJ SOLN
INTRAMUSCULAR | Status: DC | PRN
  Administered 2017-07-19: 10 mg via INTRAVENOUS

## 2017-07-19 MED ORDER — CEFEPIME HCL 2 G IJ SOLR
2.0000 g | INTRAMUSCULAR | Status: DC
  Filled 2017-07-19: qty 2

## 2017-07-19 MED ORDER — 0.9 % SODIUM CHLORIDE (POUR BTL) OPTIME
TOPICAL | Status: DC | PRN
  Administered 2017-07-19: 1000 mL

## 2017-07-19 MED ORDER — FENTANYL CITRATE (PF) 100 MCG/2ML IJ SOLN
INTRAMUSCULAR | Status: DC | PRN
  Administered 2017-07-19: 150 ug via INTRAVENOUS

## 2017-07-19 MED ORDER — MIDAZOLAM HCL 2 MG/2ML IJ SOLN
INTRAMUSCULAR | Status: AC
  Filled 2017-07-19: qty 2

## 2017-07-19 MED ORDER — BACITRACIN ZINC 500 UNIT/GM EX OINT
TOPICAL_OINTMENT | CUTANEOUS | Status: AC
  Filled 2017-07-19: qty 28.35

## 2017-07-19 MED ORDER — CEFAZOLIN SODIUM-DEXTROSE 2-3 GM-%(50ML) IV SOLR
INTRAVENOUS | Status: DC | PRN
  Administered 2017-07-19: 2 g via INTRAVENOUS

## 2017-07-19 MED ORDER — PHENYLEPHRINE HCL 10 MG/ML IJ SOLN
INTRAMUSCULAR | Status: DC | PRN
  Administered 2017-07-19: 40 ug via INTRAVENOUS

## 2017-07-19 SURGICAL SUPPLY — 44 items
BLADE AVERAGE 25MMX9MM (BLADE)
BLADE AVERAGE 25X9 (BLADE) IMPLANT
BLADE LONG MED 31MMX9MM (MISCELLANEOUS) ×1
BLADE LONG MED 31X9 (MISCELLANEOUS) ×2 IMPLANT
BNDG COHESIVE 4X5 TAN STRL (GAUZE/BANDAGES/DRESSINGS) ×3 IMPLANT
BNDG CONFORM 2 STRL LF (GAUZE/BANDAGES/DRESSINGS) ×3 IMPLANT
BNDG CONFORM 3 STRL LF (GAUZE/BANDAGES/DRESSINGS) IMPLANT
BNDG ESMARK 4X9 LF (GAUZE/BANDAGES/DRESSINGS) ×3 IMPLANT
CANISTER SUCT 3000ML PPV (MISCELLANEOUS) ×3 IMPLANT
CORDS BIPOLAR (ELECTRODE) IMPLANT
COVER SURGICAL LIGHT HANDLE (MISCELLANEOUS) ×3 IMPLANT
CUFF TOURNIQUET SINGLE 18IN (TOURNIQUET CUFF) IMPLANT
CUFF TOURNIQUET SINGLE 24IN (TOURNIQUET CUFF) ×3 IMPLANT
CUFF TOURNIQUET SINGLE 34IN LL (TOURNIQUET CUFF) IMPLANT
DRAPE SURG 17X23 STRL (DRAPES) IMPLANT
DRAPE U-SHAPE 47X51 STRL (DRAPES) IMPLANT
DURAPREP 26ML APPLICATOR (WOUND CARE) IMPLANT
ELECT CAUTERY BLADE 6.4 (BLADE) IMPLANT
ELECT REM PT RETURN 9FT ADLT (ELECTROSURGICAL) ×3
ELECTRODE REM PT RTRN 9FT ADLT (ELECTROSURGICAL) ×1 IMPLANT
FACESHIELD WRAPAROUND (MASK) ×3 IMPLANT
GAUZE SPONGE 4X4 12PLY STRL (GAUZE/BANDAGES/DRESSINGS) IMPLANT
GAUZE SPONGE 4X4 12PLY STRL LF (GAUZE/BANDAGES/DRESSINGS) ×3 IMPLANT
GAUZE XEROFORM 1X8 LF (GAUZE/BANDAGES/DRESSINGS) ×3 IMPLANT
GLOVE SKINSENSE NS SZ7.5 (GLOVE) ×2
GLOVE SKINSENSE STRL SZ7.5 (GLOVE) ×1 IMPLANT
GOWN STRL REIN XL XLG (GOWN DISPOSABLE) ×3 IMPLANT
HANDPIECE INTERPULSE COAX TIP (DISPOSABLE)
KIT BASIN OR (CUSTOM PROCEDURE TRAY) ×3 IMPLANT
KIT ROOM TURNOVER OR (KITS) ×3 IMPLANT
NEEDLE HYPO 25GX1X1/2 BEV (NEEDLE) IMPLANT
NS IRRIG 1000ML POUR BTL (IV SOLUTION) ×9 IMPLANT
PACK ORTHO EXTREMITY (CUSTOM PROCEDURE TRAY) ×3 IMPLANT
PAD ARMBOARD 7.5X6 YLW CONV (MISCELLANEOUS) ×3 IMPLANT
SET HNDPC FAN SPRY TIP SCT (DISPOSABLE) IMPLANT
SPECIMEN JAR SMALL (MISCELLANEOUS) ×3 IMPLANT
SUT ETHILON 2 0 FS 18 (SUTURE) IMPLANT
SYR CONTROL 10ML LL (SYRINGE) IMPLANT
TOWEL OR 17X24 6PK STRL BLUE (TOWEL DISPOSABLE) ×3 IMPLANT
TOWEL OR 17X26 10 PK STRL BLUE (TOWEL DISPOSABLE) ×3 IMPLANT
TUBE CONNECTING 12'X1/4 (SUCTIONS)
TUBE CONNECTING 12X1/4 (SUCTIONS) IMPLANT
TUBING CYSTO DISP (UROLOGICAL SUPPLIES) IMPLANT
WATER STERILE IRR 1000ML POUR (IV SOLUTION) ×3 IMPLANT

## 2017-07-19 NOTE — Progress Notes (Signed)
[facility] PROGRESS NOTE  DAGOBERTO NEALY RXV:[id number] DOB: 1955-10-05 DOA: 07/17/2017 PCP: Corliss Parish, MD  Interim summary and HPI 61 y.o. male with Past medical history of type II DM, HTN, history of renal transplant on immunosuppressive medication, HLD, obesity. Patient presented with complaints of 2 weeks history of nonhealing wound on his right second toe.  Patient mentions that he was playing with his dog and the dog jumped on his foot and he injured his right toe.  It was not healing in a few days and therefore he started applying topical antibiotic.  Since last 4 days it was becoming difficult for him to walk around and he started noticing swelling involving the right calf area.  The pain became unbearable today and he decided to come to the hospital.  He had some nausea some dizziness some chills yesterday  ED work up demonstrated 2nd right toe osteomyelitis.  Assessment/Plan: 1-2nd right toe osteomyelitis  -positive findings for osteomyelitis on x-ray -continue IV antibiotics for now; remains non toxic and afebrile. -will continue PRN analgesics -planning 2nd right toe amputation later today  -patient would have ABI prior to amputation   2-acute on chronic renal failure -patient with hx of renal transplant and stage 3 CKD -continue to follow trend  -continue minimizing nephrotoxic agents and adjust meds per pharmacy for his kidney function  -will continue prograf and Myfortic   3-type 2 diabetes with hyperglycemia and CKD  -continue SSI and lantus -will follow CBG's -holding oral hypoglycemic regimen while inpatient   4-HLD -stable -will continue Tricor and lipitor   5-obesity: class 2 -Body mass index is 36.32 kg/m. -low calorie diet and exercise discussed with patient   6-GERD -continue PPI   Code Status: Full code Family Communication: friend at bedside  Disposition Plan: to Be determine after surgical intervention. But will anticipate  discharge home once medically stable. Will have PT seen him after surgery.    Consultants:  Orthopedic surgery   Procedures:  anticipated second right toe amputation later today (12/19)  Antibiotics:  Cefepime and vancomycin 07/17/17  HPI/Subjective: No fever, no CP, no SOB. No acute distress. Reported feeling hungry and thirsty.  Objective: Vitals:   07/19/17 0010 07/19/17 0510  BP: (!) 141/66 (!) 142/71  Pulse: 71 71  Resp: 20 20  Temp: 98.9 F (37.2 C) 98.7 F (37.1 C)  SpO2: 97% 98%    Intake/Output Summary (Last 24 hours) at 07/19/2017 0950 Last data filed at 07/19/2017 0600 Gross per 24 hour  Intake 4797.08 ml  Output -  Net 4797.08 ml   Filed Weights   07/17/17 1017 07/17/17 2228  Weight: 111.1 kg (245 lb) 119.8 kg (264 lb 1.8 oz)    Exam:   General: no fever, no CP, no SOB. Denies abd pain, nausea and vomiting.   Cardiovascular: S1 an dS2, no rubs, no gallops; positive SEM  Respiratory: normal resp effort, good air movement bilaterally, no wheezing, no crackles.   Abdomen: soft, NT, ND, positive BS  Musculoskeletal: no cyanosis, no clubbing and no edema appreciated. 2nd right toe with gangrenous changes appreciated and unchanged; no drainage.   Data Reviewed: Basic Metabolic Panel: Recent Labs  Lab 07/17/17 1630 07/18/17 0602 07/19/17 0435  NA 134* 135  --   K 3.9 4.1  --   CL 101 104  --   CO2 22 21*  --   GLUCOSE 201* 314*  --   BUN 39* 37*  --   CREATININE 2.21* 2.33* 2.30*  CALCIUM 8.8* 8.3*  --   MG  --  1.5*  --    Liver Function Tests: Recent Labs  Lab 07/18/17 0602  AST 12*  ALT 16*  ALKPHOS 66  BILITOT 1.1  PROT 5.8*  ALBUMIN 3.0*   CBC: Recent Labs  Lab 07/17/17 1630 07/18/17 0602  WBC 6.7 4.2  NEUTROABS 5.4 3.1  HGB 12.3* 10.7*  HCT 36.0* 32.2*  MCV 81.4 81.9  PLT 112* 86*   Cardiac Enzymes: Recent Labs  Lab 07/17/17 2055  CKTOTAL 114    CBG: Recent Labs  Lab 07/18/17 0616 07/18/17 1121  07/18/17 1650 07/18/17 2102 07/19/17 0638  GLUCAP 286* 304* 201* 203* 213*    Studies: Dg Foot Complete Right  Result Date: 07/17/2017 CLINICAL DATA:  Right second toe injury several weeks ago with evidence of infection. Clinical concern of osteomyelitis. EXAM: RIGHT FOOT COMPLETE - 3+ VIEW COMPARISON:  No recent studies in PACs FINDINGS: There are destructive changes of the tip and distal shaft of the distal phalanx of the second toe. There is a large amount of overlying soft tissue swelling. More proximally the second toe is intact. The other toes are intact as are the metatarsals. There are plantar and Achilles region spurs. IMPRESSION: Findings compatible with osteomyelitis of the distal phalanx of the second toe with overlying cellulitis. Electronically Signed   By: David  Martinique M.D.   On: 07/17/2017 16:16    Scheduled Meds: . atorvastatin  10 mg Oral q1800  . docusate sodium  100 mg Oral BID  . fenofibrate  54 mg Oral Daily  . insulin aspart  0-15 Units Subcutaneous TID WC  . insulin aspart  0-5 Units Subcutaneous QHS  . insulin aspart  3 Units Subcutaneous TID WC  . insulin glargine  15 Units Subcutaneous Daily  . methocarbamol  500 mg Oral TID  . multivitamin with minerals  1 tablet Oral Daily  . mycophenolate  360 mg Oral BID  . nutrition supplement (JUVEN)  1 packet Oral BID BM  . pantoprazole  40 mg Oral Daily  . tacrolimus  1 mg Oral QHS  . tacrolimus  2 mg Oral Daily   Continuous Infusions: . sodium chloride 125 mL/hr at 07/19/17 0929  . ceFEPime (MAXIPIME) IV 2 g (07/19/17 0813)  . metronidazole Stopped (07/19/17 0527)  . vancomycin Stopped (07/18/17 1701)    Principal Problem:   Osteomyelitis ([place]) Active Problems:   Diabetes mellitus ([place])   Hyperlipidemia   Essential hypertension   Kidney transplant recipient   Acute on chronic kidney failure ([place])   Time spent: 25 minutes   [facility] [place] [facility] Pager [phone]. If 7PM-7AM, please  contact night-coverage at [email or web address], password [facility] 07/19/2017, 9:50 AM  LOS: 2 days

## 2017-07-19 NOTE — H&P (Signed)
H&P update  The surgical history has been reviewed and remains accurate without interval change.  The patient was re-examined and patient's physiologic condition has not changed significantly in the last 30 days. The condition still exists that makes this procedure necessary. The treatment plan remains the same, without new options for care.  No new pharmacological allergies or types of therapy has been initiated that would change the plan or the appropriateness of the plan.  The patient and/or family understand the potential benefits and risks.  Steven Cecil, MD 07/19/2017 7:08 AM

## 2017-07-19 NOTE — Anesthesia Procedure Notes (Signed)
Procedure Name: LMA Insertion Date/Time: 07/19/2017 2:06 PM Performed by: Babs Bertin, CRNA Pre-anesthesia Checklist: Patient identified, Emergency Drugs available, Suction available and Patient being monitored Patient Re-evaluated:Patient Re-evaluated prior to induction Oxygen Delivery Method: Circle System Utilized Preoxygenation: Pre-oxygenation with 100% oxygen Induction Type: IV induction Ventilation: Mask ventilation without difficulty LMA: LMA inserted LMA Size: 5.0 Number of attempts: 1 Airway Equipment and Method: Bite block Placement Confirmation: positive ETCO2 Tube secured with: Tape Dental Injury: Teeth and Oropharynx as per pre-operative assessment

## 2017-07-19 NOTE — Discharge Instructions (Signed)
Postoperative instructions:  Weightbearing instructions: as tolerated to heel of the foot only  Dressing instructions: Keep your dressing and/or splint clean and dry at all times.  It will be removed at your first post-operative appointment.  Your stitches and/or staples will be removed at this visit.  Incision instructions:  Do not soak your incision for 3 weeks after surgery.  If the incision gets wet, pat dry and do not scrub the incision.  Pain control:  You have been given a prescription to be taken as directed for post-operative pain control.  In addition, elevate the operative extremity above the heart at all times to prevent swelling and throbbing pain.  Take over-the-counter Colace, 100mg  by mouth twice a day while taking narcotic pain medications to help prevent constipation.  Follow up appointments: 1) 10-14 days for suture removal and wound check. 2) Dr. Erlinda Hong as scheduled.   -------------------------------------------------------------------------------------------------------------  After Surgery Pain Control:  After your surgery, post-surgical discomfort or pain is likely. This discomfort can last several days to a few weeks. At certain times of the day your discomfort may be more intense.  Did you receive a nerve block?  A nerve block can provide pain relief for one hour to two days after your surgery. As long as the nerve block is working, you will experience little or no sensation in the area the surgeon operated on.  As the nerve block wears off, you will begin to experience pain or discomfort. It is very important that you begin taking your prescribed pain medication before the nerve block fully wears off. Treating your pain at the first sign of the block wearing off will ensure your pain is better controlled and more tolerable when full-sensation returns. Do not wait until the pain is intolerable, as the medicine will be less effective. It is better to treat pain in advance  than to try and catch up.  General Anesthesia:  If you did not receive a nerve block during your surgery, you will need to start taking your pain medication shortly after your surgery and should continue to do so as prescribed by your surgeon.  Pain Medication:  Most commonly we prescribe Vicodin and Percocet for post-operative pain. Both of these medications contain a combination of acetaminophen (Tylenol) and a narcotic to help control pain.   It takes between 30 and 45 minutes before pain medication starts to work. It is important to take your medication before your pain level gets too intense.   Nausea is a common side effect of many pain medications. You will want to eat something before taking your pain medicine to help prevent nausea.   If you are taking a prescription pain medication that contains acetaminophen, we recommend that you do not take additional over the counter acetaminophen (Tylenol).  Other pain relieving options:   Using a cold pack to ice the affected area a few times a day (15 to 20 minutes at a time) can help to relieve pain, reduce swelling and bruising.   Elevation of the affected area can also help to reduce pain and swelling.

## 2017-07-19 NOTE — Care Management Note (Signed)
Case Management Note  Patient Details  Name: Steven [name] MRN: [id number] Date of Birth: Sep 10, 1955  Subjective/Objective:   Pt admitted with osteomyelitis of his toe. He is from home alone.  CSW consulted for access to meds at discharge.               Action/Plan: CM met with the patient and he states he does not have any issues accessing his medications. He states he is able to pick them up and has no trouble affording them. Awaiting surgery today and then PT/OT evals for home needs. CM following.  Expected Discharge Date:  (unknown)               Expected Discharge Plan:  Home/Self Care  In-House Referral:     Discharge planning Services     Post Acute Care Choice:    Choice offered to:     DME Arranged:    DME Agency:     HH Arranged:    HH Agency:     Status of Service:  In process, will continue to follow  If discussed at Long Length of Stay Meetings, dates discussed:    Additional Comments:  Pollie Friar, RN 07/19/2017, 10:34 AM

## 2017-07-19 NOTE — Transfer of Care (Signed)
Immediate Anesthesia Transfer of Care Note  Patient: Steven [name]  Procedure(s) Performed: AMPUTATION RIGHT 2ND TOE (Right )  Patient Location: PACU  Anesthesia Type:General  Level of Consciousness: awake, alert  and oriented  Airway & Oxygen Therapy: Patient Spontanous Breathing  Post-op Assessment: Report given to RN and Post -op Vital signs reviewed and stable  Post vital signs: Reviewed and stable  Last Vitals:  Vitals:   07/19/17 1136 07/19/17 1456  BP: (!) 150/61 (!) 143/68  Pulse:  77  Resp: 20 16  Temp: 36.8 C 36.6 C  SpO2: 99% 98%    Last Pain:  Vitals:   07/19/17 1136  TempSrc: Oral  PainSc:          Complications: No apparent anesthesia complications

## 2017-07-19 NOTE — Anesthesia Preprocedure Evaluation (Signed)
Anesthesia Evaluation  Patient identified by MRN, date of birth, ID band Patient awake    Reviewed: Allergy & Precautions, NPO status , [name] Chart, lab work & pertinent test results  Airway Mallampati: II  TM Distance: >3 FB Neck ROM: Full    Dental  (+) Dental Advisory Given   Pulmonary neg pulmonary ROS,    breath sounds clear to auscultation       Cardiovascular hypertension, Pt. on medications  Rhythm:Regular Rate:Normal     Neuro/Psych negative neurological ROS     GI/Hepatic negative GI ROS, Neg liver ROS,   Endo/Other  diabetes, Type 2  Renal/GU CRF and ARFRenal diseaseS/p kidney transplant     Musculoskeletal   Abdominal   Peds  Hematology  (+) anemia , thrombocytopenia   Anesthesia Other Findings   Reproductive/Obstetrics                             Lab Results  Component Value Date   WBC 4.2 07/18/2017   HGB 10.7 (L) 07/18/2017   HCT 32.2 (L) 07/18/2017   MCV 81.9 07/18/2017   PLT 86 (L) 07/18/2017   Lab Results  Component Value Date   CREATININE 2.30 (H) 07/19/2017   BUN 37 (H) 07/18/2017   NA 135 07/18/2017   K 4.1 07/18/2017   CL 104 07/18/2017   CO2 21 (L) 07/18/2017    Anesthesia Physical Anesthesia Plan  ASA: III  Anesthesia Plan:    Post-op Pain Management:    Induction:   PONV Risk Score and Plan:   Airway Management Planned:   Additional Equipment:   Intra-op Plan:   Post-operative Plan:   Informed Consent:   Plan Discussed with:   Anesthesia Plan Comments:         Anesthesia Quick Evaluation

## 2017-07-19 NOTE — Op Note (Addendum)
   Date of Surgery: 07/19/2017  INDICATIONS: Steven [name] is a 61 y.o.-year-old male with a right second toe osteomyelitis;  The patient did consent to the procedure after discussion of the risks and benefits.  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  PROCEDURE:  1.  Irrigation and debridement of bone, skin, subcutaneous tissue of right second toe 2.  Amputation of right second toe through proximal phalanx 3.  Adjacent tissue rearrangement right second toe  SURGEON: N. Eduard Roux, M.D.  ASSIST: Ky Barban, RNFA.  ANESTHESIA:  general  IV FLUIDS AND URINE: See anesthesia.  ESTIMATED BLOOD LOSS: Minimal mL.  IMPLANTS: None none  DRAINS: None  COMPLICATIONS: None.  DESCRIPTION OF PROCEDURE: The patient was brought to the operating room and placed supine on the operating table.  The patient had been signed prior to the procedure and this was documented. The patient had the anesthesia placed by the anesthesiologist.  A time-out was performed to confirm that this was the correct patient, site, side and location. The patient did receive antibiotics prior to the incision and was re-dosed during the procedure as needed at indicated intervals.  The patient had the operative extremity prepped and draped in the standard surgical fashion.    A modified fishmouth incision was created.  Full-thickness flaps were elevated off of the phalanges.  Sharp dissection was carried down to the bone using a 15 blade.  The amputation was first performed through the proximal interphalangeal joint.  I then performed sharp excisional debridement of the proximal phalanx, skin, subcutaneous tissue with a rongeur.  The tissue at the initial level of amputation did not appear to be viable.  Therefore the amputation level was then brought back through the proximal phalanx with a rongeur.  The bony surface was then made smooth.  After thorough debridement and the amputation the  wound was then thoroughly irrigated with normal saline.  Hemostasis was obtained.  Skin edges had good punctate bleeding.  Adjacent tissue rearrangement was then performed in order to primarily close the wound.  Sterile dressings were applied.  Patient tolerated the procedure well had no immediate complications.  POSTOPERATIVE PLAN: Patient will be heel weightbearing only.  Follow-up in 1 week for wound check.  He should be placed on 4 weeks of doxycycline 100 mg bid.   Steven Cecil, MD [place] 2:44 PM

## 2017-07-19 NOTE — Progress Notes (Signed)
Pt placing self on the CPAP- tolerating well.  No issues noted.  Will cont to follow progress and assist as needed.

## 2017-07-19 NOTE — Progress Notes (Signed)
ABIs completed. ABIs bilaterally could not be ascertained due to non compressible arteries. Doppler waveforms however were within normal limits. TBIs on the right great toe were abnormal with normal TBIs on the left. [facility], [place] 07/19/2017, 11:09 AM

## 2017-07-20 ENCOUNTER — Encounter (HOSPITAL_COMMUNITY): Payer: Self-pay | Admitting: Orthopaedic Surgery

## 2017-07-20 DIAGNOSIS — I10 Essential (primary) hypertension: Secondary | ICD-10-CM

## 2017-07-20 DIAGNOSIS — E66813 Obesity, class 3: Secondary | ICD-10-CM

## 2017-07-20 LAB — GLUCOSE, CAPILLARY
Glucose-Capillary: 189 mg/dL — ABNORMAL HIGH (ref 65–99)
Glucose-Capillary: 171 mg/dL — ABNORMAL HIGH (ref 65–99)
Glucose-Capillary: 198 mg/dL — ABNORMAL HIGH (ref 65–99)

## 2017-07-20 LAB — CBC
HCT: 32.2 % — ABNORMAL LOW (ref 39.0–52.0)
Hemoglobin: 10.8 g/dL — ABNORMAL LOW (ref 13.0–17.0)
MCH: 27.4 pg (ref 26.0–34.0)
MCHC: 33.5 g/dL (ref 30.0–36.0)
MCV: 81.7 fL (ref 78.0–100.0)
Platelets: 96 10*3/uL — ABNORMAL LOW (ref 150–400)
RBC: 3.94 MIL/uL — ABNORMAL LOW (ref 4.22–5.81)
RDW: 14 % (ref 11.5–15.5)
WBC: 3.8 10*3/uL — ABNORMAL LOW (ref 4.0–10.5)

## 2017-07-20 LAB — BASIC METABOLIC PANEL
Anion gap: 9 (ref 5–15)
BUN: 32 mg/dL — ABNORMAL HIGH (ref 6–20)
CO2: 18 mmol/L — ABNORMAL LOW (ref 22–32)
Calcium: 8.2 mg/dL — ABNORMAL LOW (ref 8.9–10.3)
Chloride: 108 mmol/L (ref 101–111)
Creatinine, Ser: 2.17 mg/dL — ABNORMAL HIGH (ref 0.61–1.24)
GFR calc Af Amer: 37 mL/min — ABNORMAL LOW (ref >60)
GFR calc non Af Amer: 32 mL/min — ABNORMAL LOW (ref >60)
Glucose, Bld: 168 mg/dL — ABNORMAL HIGH (ref 65–99)
Potassium: 3.9 mmol/L (ref 3.5–5.1)
Sodium: 135 mmol/L (ref 135–145)

## 2017-07-20 MED ORDER — DOXYCYCLINE HYCLATE 100 MG PO TABS: 100.0000 mg | ORAL_TABLET | Freq: Two times a day (BID) | ORAL | 0 refills | Status: AC

## 2017-07-20 MED ORDER — METHOCARBAMOL 500 MG PO TABS: 500.0000 mg | ORAL_TABLET | Freq: Three times a day (TID) | ORAL | 0 refills | Status: DC | PRN

## 2017-07-20 MED ORDER — DOCUSATE SODIUM 100 MG PO CAPS: 100.0000 mg | ORAL_CAPSULE | Freq: Two times a day (BID) | ORAL | 0 refills | Status: DC

## 2017-07-20 MED ORDER — HYDROCODONE-ACETAMINOPHEN 5-325 MG PO TABS: 1.0000 | ORAL_TABLET | Freq: Four times a day (QID) | ORAL | 0 refills | Status: DC | PRN

## 2017-07-20 NOTE — Progress Notes (Signed)
Occupational Therapy Evaluation Patient Details Name: Steven [name] MRN: [id number] DOB: 01/21/1956 Today's Date: 07/20/2017    History of Present Illness 61 y.o.-year-old male with a right second toe osteomyelitis. Underwent  Amputation of right second toe through proximal phalanx 07/19/17.  Past medical history of type II DM, HTN, history of renal transplant on immunosuppressive medication, HLD, obesity.   Clinical Impression   PTA, pt lived at home with his family and was independent with ADL and mobility. Completed all education regarding ADL and functional mobility for ADL. Pt will need a 3 in1 for DC. Pt safe to DC when medically stable.     Follow Up Recommendations  No OT follow up;Supervision - Intermittent    Equipment Recommendations  3 in 1 bedside commode(to use as shower seat)    Recommendations for Other Services       Precautions / Restrictions Precautions Precautions: Other (comment) Required Braces or Orthoses: Other Brace/Splint(post op shoe) Restrictions Weight Bearing Restrictions: Yes RLE Weight Bearing: Weight bearing as tolerated Other Position/Activity Restrictions: WB through R heel      Mobility Bed Mobility Overal bed mobility: Modified Independent                Transfers Overall transfer level: Modified independent                    Balance Overall balance assessment: No apparent balance deficits (not formally assessed)                                         ADL either performed or assessed with clinical judgement   ADL Overall ADL's : Needs assistance/impaired                                     Functional mobility during ADLs: Modified independent;Rolling walker General ADL Comments: Educated on safe shower transfer techniques and compensatory strategies for ADL. Educated on home safeety and reducing risk of falls.     Vision         Perception     Praxis      Pertinent  Vitals/Pain Pain Assessment: No/denies pain     Hand Dominance Right   Extremity/Trunk Assessment Upper Extremity Assessment Upper Extremity Assessment: Overall WFL for tasks assessed   Lower Extremity Assessment Lower Extremity Assessment: RLE deficits/detail RLE Deficits / Details: s/p 2nd toe amputation; post op shoe and post op dressings   Cervical / Trunk Assessment Cervical / Trunk Assessment: Normal   Communication Communication Communication: No difficulties   Cognition Arousal/Alertness: Awake/alert Behavior During Therapy: WFL for tasks assessed/performed Overall Cognitive Status: Within Functional Limits for tasks assessed                                     General Comments       Exercises Exercises: Other exercises Other Exercises Other Exercises: Educated on importance of keeping foot elevated   Shoulder Instructions      Home Living Family/patient expects to be discharged to:: Private residence Living Arrangements: Spouse/significant other;Children Available Help at Discharge: Family;Available 24 hours/day Type of Home: House Home Access: Stairs to enter [employer] of Steps: 1   Home Layout: Two level;Able to live on main level  with bedroom/bathroom Alternate Level Stairs-Number of Steps: 15 Alternate Level Stairs-Rails: Can reach both Bathroom Shower/Tub: Occupational psychologist: Standard Bathroom Accessibility: Yes How Accessible: Accessible via walker Home Equipment: None          Prior Functioning/Environment Level of Independence: Independent                 OT Problem List: Obesity;Decreased range of motion;Decreased knowledge of use of DME or AE;Decreased knowledge of precautions      OT Treatment/Interventions:      OT Goals(Current goals can be found in the care plan section) Acute Rehab OT Goals Patient Stated Goal: to return to normal activity OT Goal Formulation: All assessment and  education complete, DC therapy  OT Frequency:     Barriers to D/C:            Co-evaluation              AM-PAC PT "6 Clicks" Daily Activity     Outcome Measure Help from another person eating meals?: None Help from another person taking care of personal grooming?: None Help from another person toileting, which includes using toliet, bedpan, or urinal?: None Help from another person bathing (including washing, rinsing, drying)?: None Help from another person to put on and taking off regular upper body clothing?: None Help from another person to put on and taking off regular lower body clothing?: A Little 6 Click Score: 23   End of Session Equipment Utilized During Treatment: Rolling walker Nurse Communication: Mobility status  Activity Tolerance: Patient tolerated treatment well Patient left: in bed;with call bell/phone within reach  OT Visit Diagnosis: Muscle weakness (generalized) (M62.81)                Time: 9371-6967 OT Time Calculation (min): 25 min Charges:  OT General Charges $OT Visit: 1 Visit OT Evaluation $OT Eval Low Complexity: 1 Low OT Treatments $Self Care/Home Management : 8-22 mins G-Codes:     [facility], OT/L  [phone] 07/20/2017  Steven [name],H[place]ARY 07/20/2017, 10:15 AM

## 2017-07-20 NOTE — Evaluation (Signed)
Physical Therapy Evaluation Patient Details Name: Steven [name] MRN: [id number] DOB: 21-Jul-1956 Today's Date: 07/20/2017   History of Present Illness  61 y.o.-year-old male with a right second toe osteomyelitis. Underwent  Amputation of right second toe through proximal phalanx 07/19/17.  Past medical history of type II DM, HTN, history of renal transplant on immunosuppressive medication, HLD, obesity.  Clinical Impression  Patient is s/p above surgery resulting in functional limitations due to the deficits listed below (see PT Problem List). Pt is limited in his mobility by minor instability with post-op shoe and decreased knowledge of RW usage. Pt is currently mod I for bed mobility, transfers and ambulation with RW.  Patient will benefit from skilled PT to increase their independence and safety with mobility to allow discharge to the venue listed below.       Follow Up Recommendations No PT follow up    Equipment Recommendations  Rolling walker with 5" wheels       Precautions / Restrictions Precautions Precautions: Other (comment) Required Braces or Orthoses: Other Brace/Splint(post op shoe) Restrictions Weight Bearing Restrictions: Yes RLE Weight Bearing: Weight bearing as tolerated Other Position/Activity Restrictions: WB through R heel      Mobility  Bed Mobility Overal bed mobility: Modified Independent                Transfers Overall transfer level: Modified independent Equipment used: Rolling walker (2 wheeled)             General transfer comment: Good power up and steadying before reaching for RW  Ambulation/Gait Ambulation/Gait assistance: Modified independent (Device/Increase time) Ambulation Distance (Feet): 150 Feet Assistive device: Rolling walker (2 wheeled) Gait Pattern/deviations: Step-through pattern;Decreased weight shift to right   Gait velocity interpretation: at or above normal speed for age/gender General Gait Details: gait  deviations mainly based on uneveness of wearing post op shoe, pt encouraged to wear shoe on L to even out height, vc for RW proximity to decrease risk of banging operative foot on RW      Balance Overall balance assessment: No apparent balance deficits (not formally assessed)                                           Pertinent Vitals/Pain Pain Assessment: No/denies pain    Home Living Family/patient expects to be discharged to:: Private residence Living Arrangements: Spouse/significant other;Children Available Help at Discharge: Family;Available 24 hours/day Type of Home: House Home Access: Stairs to enter   [employer] of Steps: 1 Home Layout: Two level;Able to live on main level with bedroom/bathroom Home Equipment: None      Prior Function Level of Independence: Independent               Hand Dominance   Dominant Hand: Right    Extremity/Trunk Assessment   Upper Extremity Assessment Upper Extremity Assessment: Defer to OT evaluation    Lower Extremity Assessment Lower Extremity Assessment: RLE deficits/detail RLE Deficits / Details: s/p 2nd toe amputation; post op shoe and post op dressings, hip and knee ROM/strength [facility]    Cervical / Trunk Assessment Cervical / Trunk Assessment: Normal  Communication   Communication: No difficulties  Cognition Arousal/Alertness: Awake/alert Behavior During Therapy: WFL for tasks assessed/performed Overall Cognitive Status: Within Functional Limits for tasks assessed  Exercises Other Exercises Other Exercises: Educated on importance of keeping foot elevated   Assessment/Plan    PT Assessment Patient needs continued PT services  PT Problem List Decreased mobility;Decreased knowledge of use of DME;Decreased safety awareness       PT Treatment Interventions DME instruction;Gait training;Stair training;Functional mobility  training;Therapeutic activities;Therapeutic exercise;Balance training;Patient/family education    PT Goals (Current goals can be found in the Care Plan section)  Acute Rehab PT Goals Patient Stated Goal: to return to normal activity PT Goal Formulation: With patient Time For Goal Achievement: 08/03/17 Potential to Achieve Goals: Good    Frequency Min 3X/week    AM-PAC PT "6 Clicks" Daily Activity  Outcome Measure Difficulty turning over in bed (including adjusting bedclothes, sheets and blankets)?: None Difficulty moving from lying on back to sitting on the side of the bed? : None Difficulty sitting down on and standing up from a chair with arms (e.g., wheelchair, bedside commode, etc,.)?: None Help needed moving to and from a bed to chair (including a wheelchair)?: None Help needed walking in hospital room?: None Help needed climbing 3-5 steps with a railing? : A Little 6 Click Score: 23    End of Session Equipment Utilized During Treatment: Gait belt Activity Tolerance: Patient tolerated treatment well Patient left: in bed;with call bell/phone within reach Nurse Communication: Mobility status PT Visit Diagnosis: Other abnormalities of gait and mobility (R26.89);Difficulty in walking, not elsewhere classified (R26.2)    Time: 5883-2549 PT Time Calculation (min) (ACUTE ONLY): 19 min   Charges:   PT Evaluation $PT Eval Moderate Complexity: 1 Mod     PT G Codes:        Hasani Diemer B. Migdalia Dk PT, DPT Acute Rehabilitation  [phone] Pager [phone]    [place] 07/20/2017, 11:24 AM

## 2017-07-20 NOTE — Care Management Note (Signed)
Case Management Note  Patient Details  Name: LENIS NETTLETON MRN: [id number] Date of Birth: 1956/05/08  Subjective/Objective:  Patient for discharged home today, he will have follow up apt with his MD, he will schedule.  He states he needs rolling walker and 3 n 1, wanted to use [facility] , referral made to [facility], he will bring to patients room before discharge.                   Action/Plan: DC home with rolling walker and 3 n 1.   Expected Discharge Date:  07/20/17               Expected Discharge Plan:  Home/Self Care  In-House Referral:     Discharge planning Services     Post Acute Care Choice:    Choice offered to:     DME Arranged:  Walker rolling, 3-N-1 DME Agency:  [place]:    [facility] Agency:     Status of Service:  Completed, signed off  If discussed at [place] of Stay Meetings, dates discussed:    Additional Comments:  Zenon Mayo, RN 07/20/2017, 10:44 AM

## 2017-07-20 NOTE — Anesthesia Postprocedure Evaluation (Signed)
Anesthesia Post Note  Patient: Steven [name]  Procedure(s) Performed: AMPUTATION RIGHT 2ND TOE (Right )     Patient location during evaluation: PACU Anesthesia Type: General Level of consciousness: awake and alert Pain management: pain level controlled Vital Signs Assessment: post-procedure vital signs reviewed and stable Respiratory status: spontaneous breathing, nonlabored ventilation, respiratory function stable and patient connected to nasal cannula oxygen Cardiovascular status: blood pressure returned to baseline and stable Postop Assessment: no apparent nausea or vomiting Anesthetic complications: no    Last Vitals:  Vitals:   07/19/17 2124 07/20/17 0545  BP: (!) 134/57 (!) 142/41  Pulse: 67 65  Resp: 18 16  Temp: 36.8 C 36.7 C  SpO2: 100% 98%    Last Pain:  Vitals:   07/20/17 0545  TempSrc: Oral  PainSc: 0-No pain                 [name] EDWARD

## 2017-07-20 NOTE — Discharge Summary (Signed)
Physician Discharge Summary  Steven [name] GMW:[id number] DOB: [birth year] DOA: 07/17/2017  PCP: Corliss Parish, MD  Admit date: 07/17/2017 Discharge date: 07/20/2017  Time spent: 35 minutes  Recommendations for Outpatient Follow-up:  1. Repeat basic metabolic panel to follow electrolytes and renal function 2. Please arrange outpatient 2D echo to further assess patient positive systolic ejection murmur on exam.   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  Patient discharged home with instruction to follow-up with PCP in 2 weeks and to follow-up with orthopedic service in 1 week.  Diet recommendation: Heart healthy diet and modified carbohydrates.  Filed Weights   07/17/17 1017 07/17/17 2228 07/19/17 1327  Weight: 111.1 kg (245 lb) 119.8 kg (264 lb 1.8 oz) 119.7 kg (264 lb)    History of present illness:  61 y.o.malewith Past medical history oftype II DM, HTN, history of renal transplant on immunosuppressive medication, HLD, obesity. Patient presented with complaints of 2 weeks history of nonhealing wound on his right second toe. Patient mentions that he was playing with his dog and the dog jumped on his foot and he injured his right toe. It was not healing in a few days and therefore he started applying topical antibiotic. Since last 4 days it was becoming difficult for him to walk around and he started noticing swelling involving the right calf area. The pain became unbearable today and he decided to come to the hospital. He had some nausea some dizziness some chills yesterday. ED work up demonstrated 2nd right toe osteomyelitis. Patient admitted for further evaluation and treatment.  Hospital Course:  1-2nd  right toe osteomyelitis  -positive findings for osteomyelitis on x-ray -placed on IV antibiotics initially in preparation for surgery  -s/p amputation by Dr. Erlinda Hong (orthopedic service) on 12/19 -normal WBC's and no fever -discharge with 4 weeks of doxycycline, instructions to bear weight on his heel and follow up with orthopedic service in 1 week to check on wound and provide further post operative instructions. -PRN analgesics using Vicodin for severe pain provided.  2-acute on chronic renal failure -patient with hx of renal transplant and stage 3 CKD -continue minimizing nephrotoxic agents  -will continue prograf and Myfortic  -patient advise to maintain adequate hydration and to follow low sodium diet -outpatient follow up with his nephrologist  -recommending repeat BMET at follow up to assess renal function trend   3-type 2 diabetes with hyperglycemia and CKD  -continue SSI and resume home glyburide -Advised to follow modified carbohydrate diet.  4-HLD -stable -will continue Tricor and lipitor  -follow LFT's and lipid panel as an outpatient   5-obesity: class 2 -Body mass index is 36.32 kg/m. -low calorie diet and exercise discussed with patient   6-GERD -continue PPI   7-positive Murmur -SEM -will recommend outpatient 2-D echo   Procedures:  ABI LE: ABIs bilaterally could not be ascertained due to non compressible arteries. Doppler waveforms however were within normal limits. TBIs on the right great toe were abnormal with normal TBIs on the left.   Status post second right toe amputation 07/19/17.   See below for x-ray reports  Consultations:  Orthopedic service  Discharge Exam: Vitals:   07/19/17 2124 07/20/17 0545  BP: (!) 134/57 (!) 142/41  Pulse: 67  65  Resp: 18 16  Temp: 98.3 F (36.8 C) 98.1 F (36.7 C)  SpO2: 100% 98%    General: no fever, no CP, no SOB. Denies abd pain, nausea and vomiting.   Cardiovascular: S1 an dS2, no rubs, no  gallops; positive SEM  Respiratory: normal resp effort, good air movement bilaterally, no wheezing, no crackles.   Abdomen: soft, NT, ND, positive BS  Musculoskeletal: no cyanosis, no clubbing and no edema appreciated.  Patient is status post amputation of his second right toe; clean dressings without appreciated drainage of discharges in place ace wrap: Together patient dressings.  He reports mild discomfort at the moment trace edema appreciated above his ankle.    Discharge Instructions   Discharge Instructions    Diet - low sodium heart healthy   Complete by:  As directed    Discharge instructions   Complete by:  As directed    Take medications as prescribed  Please follow up with Dr. Erlinda Hong (orthopedic service) in 1 week Keep yourself well hydrated Arrange follow up with PCP in 2 weeks     Allergies as of 07/20/2017      Reactions   Sulfa Antibiotics       Medication List    TAKE these medications   albuterol 108 (90 Base) MCG/ACT inhaler Commonly known as:  PROVENTIL HFA;VENTOLIN HFA Inhale 2 puffs into the lungs every 6 (six) hours as needed for wheezing.   atorvastatin 10 MG tablet Commonly known as:  LIPITOR Take 10 mg by mouth daily.   docusate sodium 100 MG capsule Commonly known as:  COLACE Take 1 capsule (100 mg total) by mouth 2 (two) times daily.   doxycycline 100 MG tablet Commonly known as:  VIBRA-TABS Take 1 tablet (100 mg total) by mouth every 12 (twelve) hours for 28 days.   fenofibrate 48 MG tablet Commonly known as:  TRICOR Take 48 mg by mouth daily.   furosemide 40 MG tablet Commonly known as:  LASIX Take 40 mg by mouth daily as needed for fluid (swelling).   glyBURIDE 5 MG tablet Commonly known as:  DIABETA Take 5 mg by mouth daily with breakfast.   HYDROcodone-acetaminophen 5-325 MG tablet Commonly known as:  NORCO/VICODIN Take 1-2 tablets by mouth every 6 (six) hours as needed for severe pain.   insulin aspart 100 UNIT/ML  injection Commonly known as:  novoLOG Inject 0-35 Units into the skin 3 (three) times daily as needed for high blood sugar. Sliding scale   methocarbamol 500 MG tablet Commonly known as:  ROBAXIN Take 1 tablet (500 mg total) by mouth every 8 (eight) hours as needed for muscle spasms.   mycophenolate 360 MG Tbec EC tablet Commonly known as:  MYFORTIC Take 360 mg by mouth 2 (two) times daily.   omeprazole 20 MG capsule Commonly known as:  PRILOSEC Take 20 mg by mouth daily.   tacrolimus 1 MG capsule Commonly known as:  PROGRAF Take 1-2 mg by mouth 2 (two) times daily. Take 2 capsules (2 gm) in the am and Take 1 capsule (1 gm) at bedtime            Durable Medical Equipment  (From admission, onward)        Start     Ordered   07/20/17 0845  For home use only DME Walker rolling  Once    Question:  Patient needs a walker to treat with the following condition  Answer:  Status post right foot surgery  07/20/17 0847     Allergies  Allergen Reactions  . Sulfa Antibiotics    Follow-up Information    Leandrew Koyanagi, MD Follow up in 1 week(s).   Specialty:  Orthopedic Surgery Why:  For wound re-check Contact information: [place] [address] [phone]        Corliss Parish, MD. Schedule an appointment as soon as possible for a visit in 2 week(s).   Specialty:  Nephrology Contact information: [place] [address] [phone]           The results of significant diagnostics from this hospitalization (including imaging, microbiology, ancillary and laboratory) are listed below for reference.    Significant Diagnostic Studies: Dg Foot Complete Right  Result Date: 07/17/2017 CLINICAL DATA:  Right second toe injury several weeks ago with evidence of infection. Clinical concern of osteomyelitis. EXAM: RIGHT FOOT COMPLETE - 3+ VIEW COMPARISON:  No recent studies in PACs FINDINGS: There are destructive changes of the  tip and distal shaft of the distal phalanx of the second toe. There is a large amount of overlying soft tissue swelling. More proximally the second toe is intact. The other toes are intact as are the metatarsals. There are plantar and Achilles region spurs. IMPRESSION: Findings compatible with osteomyelitis of the distal phalanx of the second toe with overlying cellulitis. Electronically Signed   By: David  Martinique M.D.   On: 07/17/2017 16:16    Microbiology: Recent Results (from the past 240 hour(s))  Surgical pcr screen     Status: None   Collection Time: 07/18/17 10:48 PM  Result Value Ref Range Status   MRSA, PCR NEGATIVE NEGATIVE Final   Staphylococcus aureus NEGATIVE NEGATIVE Final    Comment: (NOTE) The Xpert SA Assay (FDA approved for NASAL specimens in patients 70 years of age and older), is one component of a comprehensive surveillance program. It is not intended to diagnose infection nor to guide or monitor treatment.      Labs: Basic Metabolic Panel: Recent Labs  Lab 07/17/17 1630 07/18/17 0602 07/19/17 0435 07/20/17 0436  NA 134* 135  --  135  K 3.9 4.1  --  3.9  CL 101 104  --  108  CO2 22 21*  --  18*  GLUCOSE 201* 314*  --  168*  BUN 39* 37*  --  32*  CREATININE 2.21* 2.33* 2.30* 2.17*  CALCIUM 8.8* 8.3*  --  8.2*  MG  --  1.5*  --   --    Liver Function Tests: Recent Labs  Lab 07/18/17 0602  AST 12*  ALT 16*  ALKPHOS 66  BILITOT 1.1  PROT 5.8*  ALBUMIN 3.0*   CBC: Recent Labs  Lab 07/17/17 1630 07/18/17 0602 07/20/17 0436  WBC 6.7 4.2 3.8*  NEUTROABS 5.4 3.1  --   HGB 12.3* 10.7* 10.8*  HCT 36.0* 32.2* 32.2*  MCV 81.4 81.9 81.7  PLT 112* 86* 96*   Cardiac Enzymes: Recent Labs  Lab 07/17/17 2055  CKTOTAL 114    CBG: Recent Labs  Lab 07/19/17 1457 07/19/17 1633 07/19/17 2126 07/20/17 0629 07/20/17 0752  GLUCAP 153* 161* 182* 189* 171*    Signed:  Barton Dubois MD.  [facility] 07/20/2017, 9:04 AM

## 2017-07-27 ENCOUNTER — Encounter (INDEPENDENT_AMBULATORY_CARE_PROVIDER_SITE_OTHER): Payer: Self-pay | Admitting: Orthopaedic Surgery

## 2017-07-27 ENCOUNTER — Ambulatory Visit (INDEPENDENT_AMBULATORY_CARE_PROVIDER_SITE_OTHER): Payer: BC Managed Care – PPO | Admitting: Orthopaedic Surgery

## 2017-07-27 DIAGNOSIS — M86171 Other acute osteomyelitis, right ankle and foot: Secondary | ICD-10-CM

## 2017-07-27 NOTE — Progress Notes (Signed)
Patient is one week status post right second toe amputation for osteomyelitis.  He comes in today for wound check.  He is doing well.  Denies any pain.  Denies any constitutional symptoms.  His incision is overall intact with a small area of superficial dehiscence.  There is some serosanguineous drainage.  No cellulitis or fluctuance.  We remove the sutures today.  Begin wet-to-dry dressings twice daily.  He will weightbearing to the right foot in a postop shoe.  Questions encouraged and answered.  Follow-up in 2 weeks for recheck.  Patient understands he needs to return sooner if there is any worrisome features or signs and symptoms of infection.

## 2017-08-10 ENCOUNTER — Ambulatory Visit (INDEPENDENT_AMBULATORY_CARE_PROVIDER_SITE_OTHER): Payer: BC Managed Care – PPO | Admitting: Orthopaedic Surgery

## 2017-08-10 ENCOUNTER — Encounter (INDEPENDENT_AMBULATORY_CARE_PROVIDER_SITE_OTHER): Payer: Self-pay | Admitting: Orthopaedic Surgery

## 2017-08-10 DIAGNOSIS — M86171 Other acute osteomyelitis, right ankle and foot: Secondary | ICD-10-CM

## 2017-08-10 MED ORDER — METHOCARBAMOL 500 MG PO TABS: 500.0000 mg | ORAL_TABLET | Freq: Four times a day (QID) | ORAL | 2 refills | Status: DC | PRN

## 2017-08-10 MED ORDER — HYDROCODONE-ACETAMINOPHEN 5-325 MG PO TABS: 1.0000 | ORAL_TABLET | Freq: Every day | ORAL | 0 refills | Status: DC | PRN

## 2017-08-10 NOTE — Progress Notes (Signed)
Patient is 22 days status post right second toe partial amputation.  He is overall doing well.  Has occasional pain.  He would like to return back to work.  His surgical scar is healing.  He does still have a superficial area with granulation tissue.  There is no evidence of infection.  At this point he may apply Bactroban ointment twice a day with dry dressing.  May wear regular shoes.  Prescription for Norco refilled today.  Refill Robaxin.  Follow-up in 4 weeks for wound recheck.

## 2017-09-11 ENCOUNTER — Ambulatory Visit (INDEPENDENT_AMBULATORY_CARE_PROVIDER_SITE_OTHER): Payer: BC Managed Care – PPO | Admitting: Orthopaedic Surgery

## 2017-09-11 ENCOUNTER — Encounter (INDEPENDENT_AMBULATORY_CARE_PROVIDER_SITE_OTHER): Payer: Self-pay | Admitting: Orthopaedic Surgery

## 2017-09-11 DIAGNOSIS — M86171 Other acute osteomyelitis, right ankle and foot: Secondary | ICD-10-CM

## 2017-09-11 NOTE — Progress Notes (Signed)
Patient is approximately 8 weeks status post partial right second toe patient.  He is overall well and denies any pain.  He has a dry scab over the tip of the toe but there is no drainage or signs of infection.  At this point he is stable from an orthopedic standpoint.  I counseled him on proper foot management and diabetic foot care.  Follow-up as needed.

## 2019-02-09 IMAGING — CR DG FOOT COMPLETE 3+V*R*
3 series · 3 of 3 positions shown · non-contrast
Comparison: No recent studies in PACs

CLINICAL DATA: Right second toe injury several weeks ago with
evidence of infection. Clinical concern of osteomyelitis.

EXAM:
RIGHT FOOT COMPLETE - 3+ VIEW

[x foot ap right]
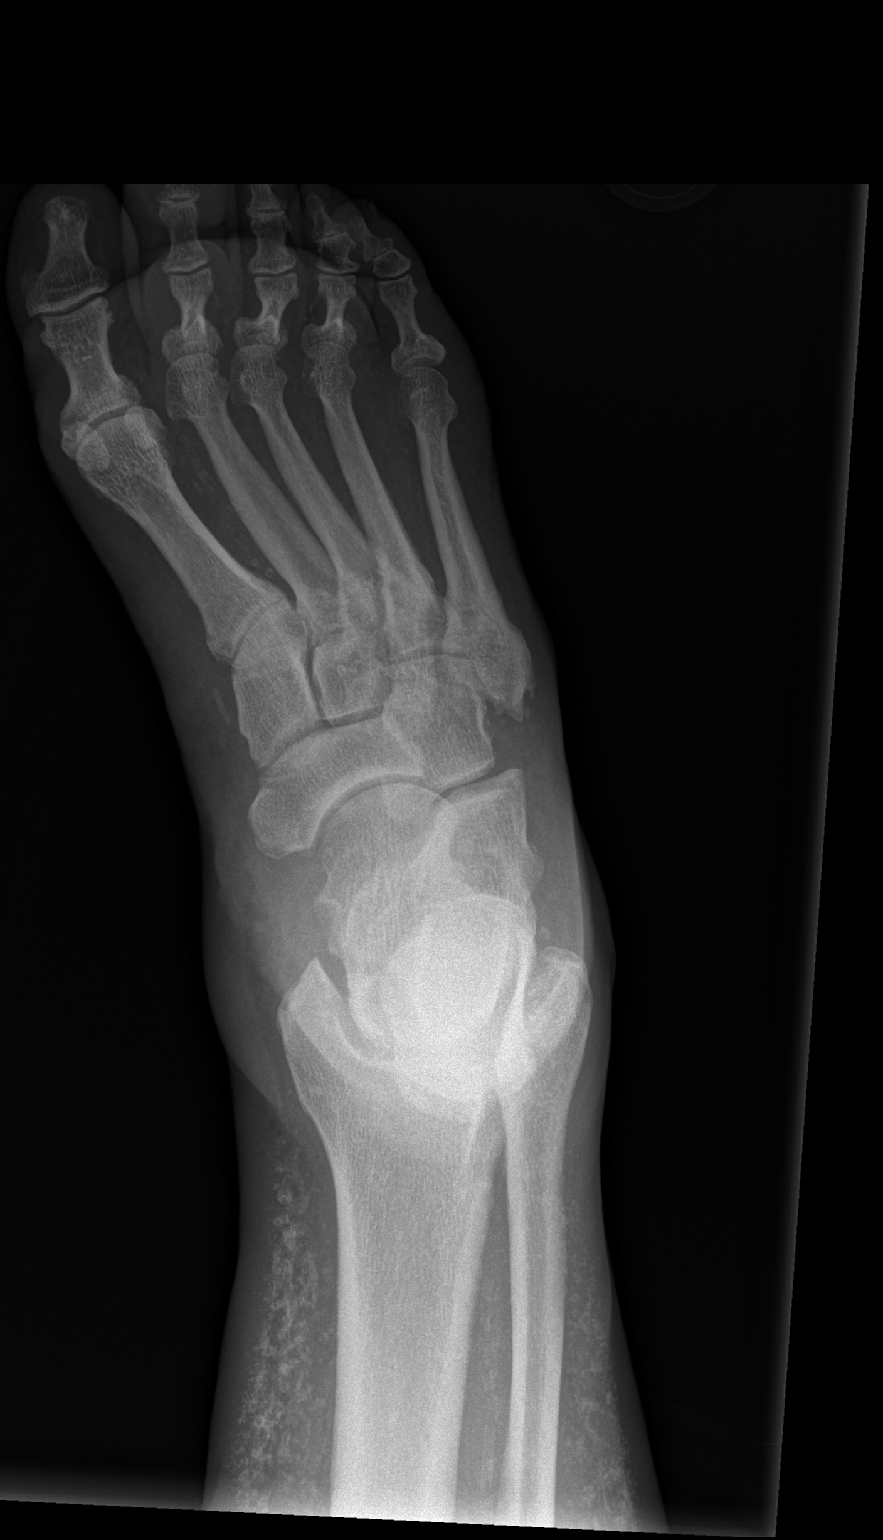

[x foot obl right]
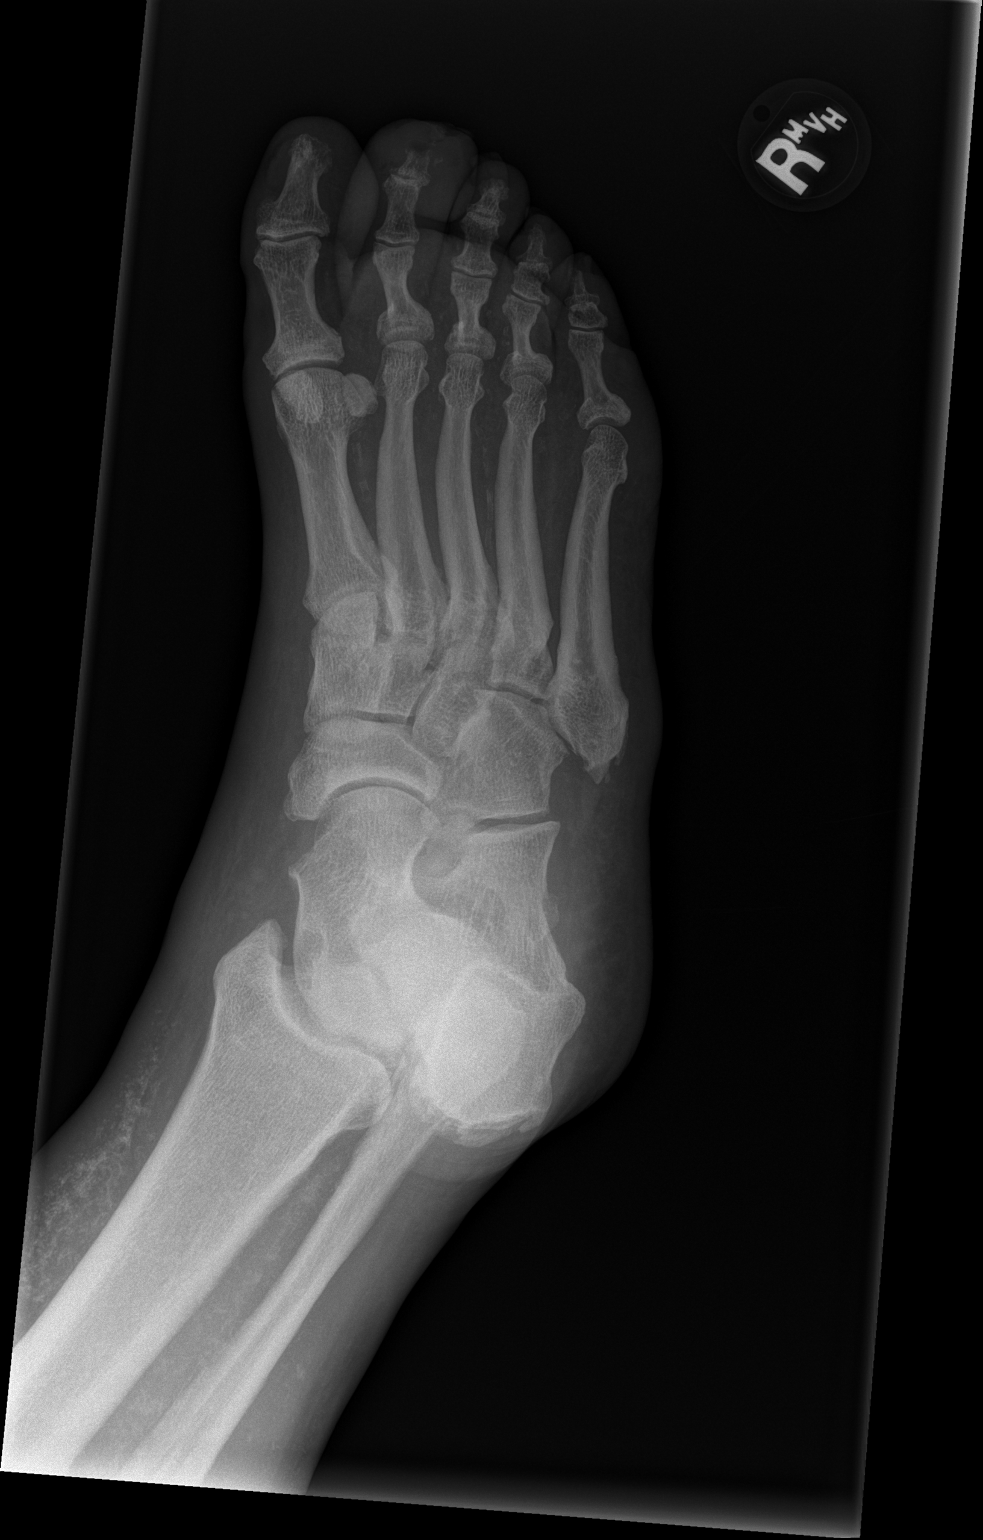

[x foot lat right]
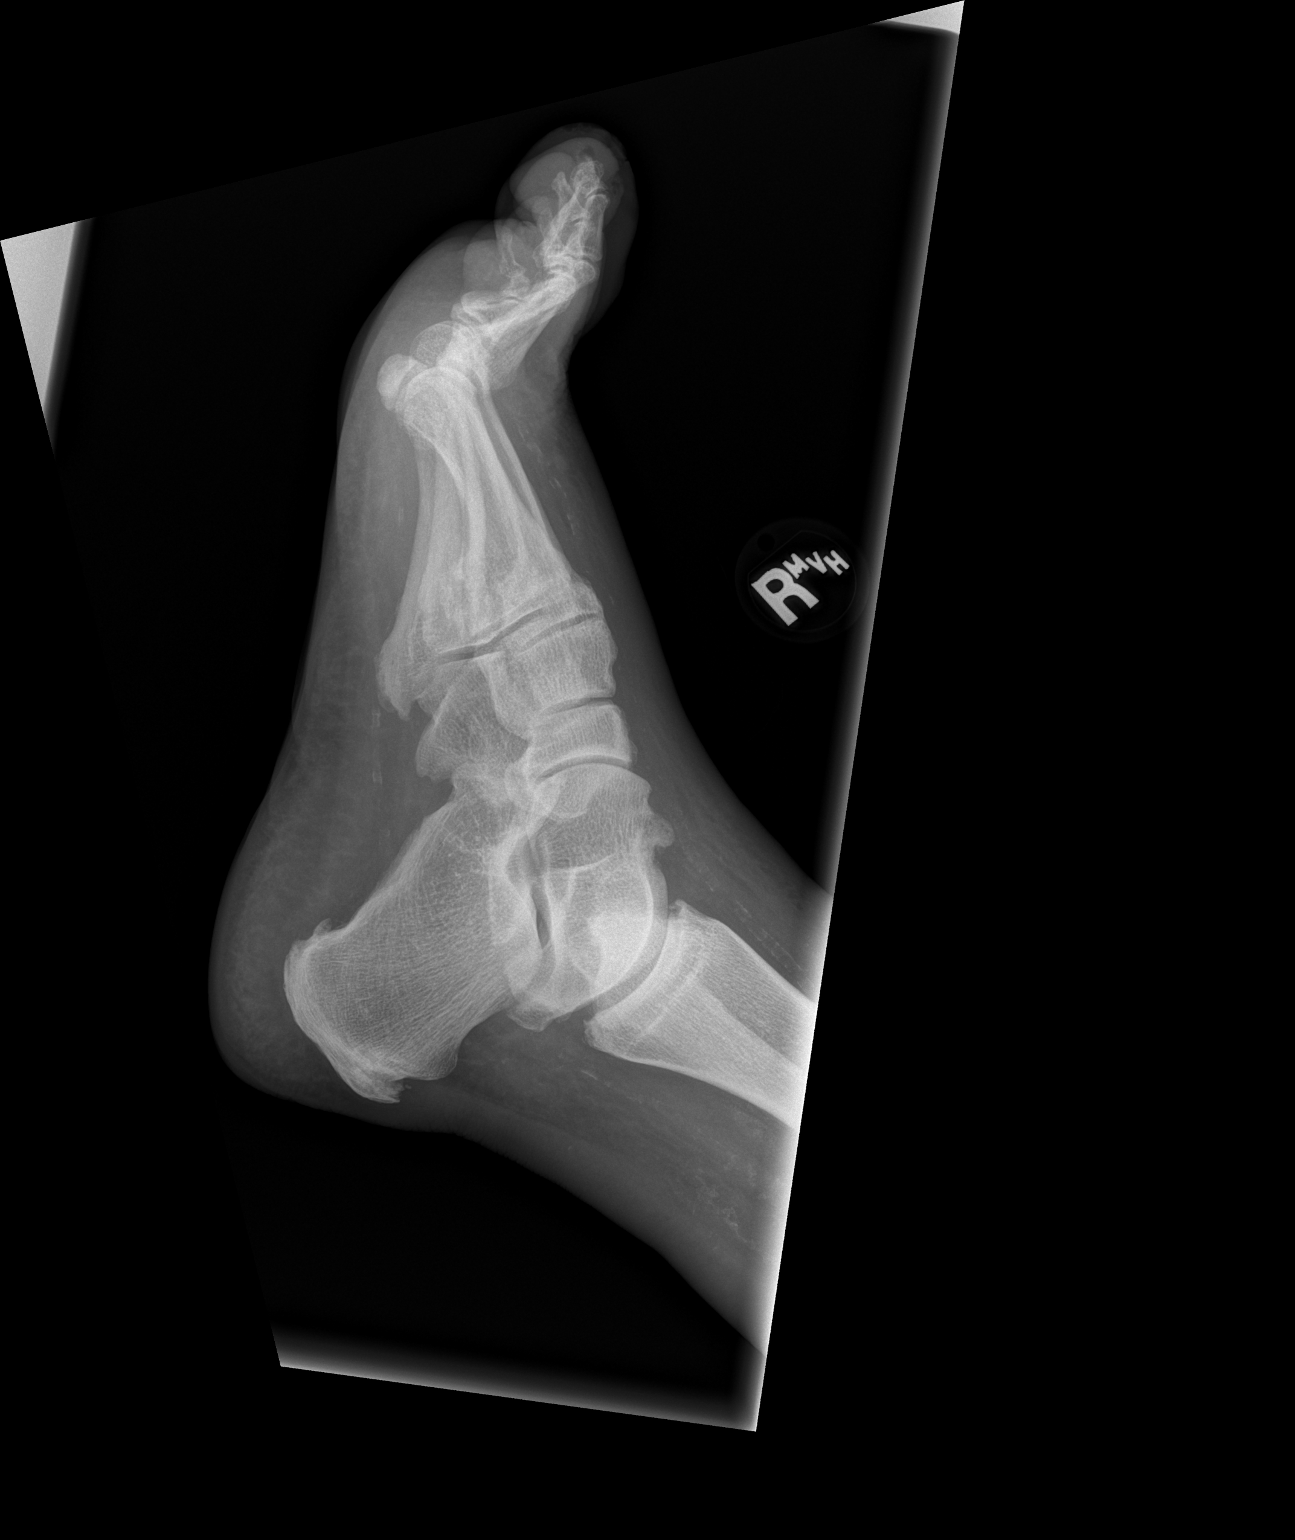

[3 of 3 positions shown; findings below may reference images not displayed]

FINDINGS: There are destructive changes of the tip and distal shaft of the
distal phalanx of the second toe. There is a large amount of
overlying soft tissue swelling. More proximally the second toe is
intact. The other toes are intact as are the metatarsals. There are
plantar and Achilles region spurs.
IMPRESSION: Findings compatible with osteomyelitis of the distal phalanx of the
second toe with overlying cellulitis.

## 2019-12-19 ENCOUNTER — Ambulatory Visit
Admission: EM | Admit: 2019-12-19 | Discharge: 2019-12-19 | Disposition: A | Discharge status: Home / Self Care | Payer: BC Managed Care – PPO | Attending: Physician Assistant | Admitting: Physician Assistant

## 2019-12-19 DIAGNOSIS — S91301A Unspecified open wound, right foot, initial encounter: Secondary | ICD-10-CM

## 2019-12-19 MED ORDER — CEPHALEXIN 500 MG PO CAPS: 500.0000 mg | ORAL_CAPSULE | Freq: Four times a day (QID) | ORAL | 0 refills | Status: DC

## 2019-12-19 MED ORDER — MUPIROCIN 2 % EX OINT: 1.0000 "application " | TOPICAL_OINTMENT | Freq: Two times a day (BID) | CUTANEOUS | 0 refills | Status: DC

## 2019-12-19 NOTE — Discharge Instructions (Signed)
Start keflex as directed. Keep wound clean and dry. You can clean gently with soap and water. Do not soak area in water. Dress with bactroban and gauze when wearing shoes to prevent further friction. Follow up with wound care center/podiatrist for further evaluation and management of open wound.

## 2019-12-19 NOTE — ED Provider Notes (Signed)
[facility]    CSN: [id number] Arrival date & time: 12/19/19  1201      History   Chief Complaint Chief Complaint  Patient presents with  . Wound Check    HPI Steven [name] is a 64 y.o. male.   64 year old male with history of DM, HTN HLD, hyperparathyroidism, kidney transplant, osteomyelitis comes in for 1 week history of open wound. Had blister due to friction, which self erupted. Since then, patient has tried to keep area clean and dry. Yellow clear drainage to the wound, denies purulent drainage. Denies erythema, warmth, fever.   States a1c approx 9. History of kidney transplant with normal creatinine, monitored by nephrologist.      Past Medical History:  Diagnosis Date  . Diabetes ([place])   . 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 Corliss Parish.  . Hyperlipidemia   . Hyperparathyroidism ([place])   . Hypertension   . Kidney transplant recipient   . Obesity     Patient Active Problem List   Diagnosis Date Noted  . Obesity, Class III, BMI 40-49.9 (morbid obesity) ([place])   . Osteomyelitis ([place]) 07/17/2017  . Acute on chronic kidney failure ([place]) 07/17/2017  . Kidney transplant recipient 05/24/2015  . Diabetes mellitus ([place]) 05/10/2012  . Hyperlipidemia 05/10/2012  . Essential hypertension 05/10/2012    Past Surgical History:  Procedure Laterality Date  . AMPUTATION Right 07/19/2017   Procedure: AMPUTATION RIGHT 2ND TOE;  Surgeon: Leandrew Koyanagi, MD;  Location: [facility];  Service: Orthopedics;  Laterality: Right;  . KIDNEY TRANSPLANT  2006  . NEPHRECTOMY TRANSPLANTED ORGAN         Home Medications    Prior to Admission medications   Medication Sig Start Date End Date Taking? Authorizing Provider  albuterol (PROVENTIL HFA;VENTOLIN HFA) 108 (90 BASE) MCG/ACT inhaler Inhale 2 puffs into the lungs every 6 (six) hours as needed for wheezing. 05/17/12 05/17/13  Shawnee Knapp, MD  atorvastatin (LIPITOR) 10 MG  tablet Take 10 mg by mouth daily.  07/07/17   [provider]  cephALEXin (KEFLEX) 500 MG capsule Take 1 capsule (500 mg total) by mouth 4 (four) times daily. 12/19/19   Tasia Catchings, Kwamaine Cuppett V, PA-C  docusate sodium (COLACE) 100 MG capsule Take 1 capsule (100 mg total) by mouth 2 (two) times daily. 07/20/17   Barton Dubois, MD  fenofibrate (TRICOR) 48 MG tablet Take 48 mg by mouth daily.    [provider]  furosemide (LASIX) 40 MG tablet Take 40 mg by mouth daily as needed for fluid (swelling).  07/06/17   [provider]  glyBURIDE (DIABETA) 5 MG tablet Take 5 mg by mouth daily with breakfast.    [provider]  HYDROcodone-acetaminophen (NORCO/VICODIN) 5-325 MG tablet Take 1-2 tablets by mouth every 6 (six) hours as needed for severe pain. 07/20/17   Barton Dubois, MD  insulin aspart (NOVOLOG) 100 UNIT/ML injection Inject 0-35 Units into the skin 3 (three) times daily as needed for high blood sugar. Sliding scale     [provider]  methocarbamol (ROBAXIN) 500 MG tablet Take 1 tablet (500 mg total) by mouth every 8 (eight) hours as needed for muscle spasms. 07/20/17   Barton Dubois, MD  methocarbamol (ROBAXIN) 500 MG tablet Take 1 tablet (500 mg total) by mouth every 6 (six) hours as needed for muscle spasms. 08/10/17   Leandrew Koyanagi, MD  mupirocin ointment (BACTROBAN) 2 % Apply 1 application topically  2 (two) times daily. 12/19/19   Ok Edwards, PA-C  mycophenolate (MYFORTIC) 360 MG TBEC Take 360 mg by mouth 2 (two) times daily.    [provider]  omeprazole (PRILOSEC) 20 MG capsule Take 20 mg by mouth daily.  07/03/17   [provider]  tacrolimus (PROGRAF) 1 MG capsule Take 1-2 mg by mouth 2 (two) times daily. Take 2 capsules (2 gm) in the am and Take 1 capsule (1 gm) at bedtime    [provider]    Family History History reviewed. No pertinent family history.  Social History Social History   Tobacco Use  . Smoking status: Never  Smoker  . Smokeless tobacco: Former Systems developer    Types: Chew  Substance Use Topics  . Alcohol use: No  . Drug use: No     Allergies   Sulfa antibiotics   Review of Systems Review of Systems  Reason unable to perform ROS: See HPI as above.     Physical Exam Triage Vital Signs ED Triage Vitals  Enc Vitals Group     BP 12/19/19 1234 (!) 154/78     Pulse Rate 12/19/19 1234 80     Resp 12/19/19 1234 16     Temp 12/19/19 1234 98 F (36.7 C)     Temp Source 12/19/19 1234 Oral     SpO2 12/19/19 1234 98 %     Weight --      Height --      Head Circumference --      Peak Flow --      Pain Score 12/19/19 1237 5     Pain Loc --      Pain Edu? --      Excl. in [place]? --    No data found.  Updated Vital Signs BP (!) 154/78 (BP Location: Right Arm)   Pulse 80   Temp 98 F (36.7 C) (Oral)   Resp 16   SpO2 98%   Physical Exam Constitutional:      General: He is not in acute distress.    Appearance: Normal appearance. He is well-developed. He is not toxic-appearing or diaphoretic.  HENT:     Head: Normocephalic and atraumatic.  Eyes:     Conjunctiva/sclera: Conjunctivae normal.     Pupils: Pupils are equal, round, and reactive to light.  Pulmonary:     Effort: Pulmonary effort is normal. No respiratory distress.     Comments: Speaking in full sentences without difficulty Musculoskeletal:     Cervical back: Normal range of motion and neck supple.     Comments: 2+ pitting edema to the right leg that is at baseline per patient.   Skin:    General: Skin is warm and dry.     Comments: See picture below. approx 2cm x 3cm open wound to the right heal. Surrounding swelling with erythema. Mild warmth. No purulent drainage.   Neurological:     Mental Status: He is alert and oriented to person, place, and time.          UC Treatments / Results  Labs (all labs ordered are listed, but only abnormal results are displayed) Labs Reviewed - No data to  display  EKG   Radiology No results found.  Procedures Procedures (including critical care time)  Medications Ordered in UC Medications - No data to display  Initial Impression / Assessment and Plan / UC Course  I have reviewed the triage vital signs and the nursing notes.  Pertinent labs & imaging results that were available during my care of the patient were reviewed by me and considered in my medical decision making (see chart for details).    Keflex as directed. Wound care instructions given. Due to patient's PMH, will have patient follow up with wound clinic to monitor for wound healing. Return precautions given. Patient expresses understanding and agrees to plan.  Final Clinical Impressions(s) / UC Diagnoses   Final diagnoses:  Open wound of right heel, initial encounter   ED Prescriptions    Medication Sig Dispense Auth. Provider   mupirocin ointment (BACTROBAN) 2 % Apply 1 application topically 2 (two) times daily. 22 g Akaisha Truman V, PA-C   cephALEXin (KEFLEX) 500 MG capsule Take 1 capsule (500 mg total) by mouth 4 (four) times daily. 28 capsule Ok Edwards, PA-C     PDMP not reviewed this encounter.   Ok Edwards, PA-C 12/19/19 1534

## 2019-12-19 NOTE — ED Triage Notes (Signed)
Pt c/o a wound to back of heel on rt foot for a week. States hx of DM

## 2019-12-21 ENCOUNTER — Emergency Department (HOSPITAL_COMMUNITY): Payer: BC Managed Care – PPO

## 2019-12-21 ENCOUNTER — Other Ambulatory Visit: Payer: Self-pay

## 2019-12-21 ENCOUNTER — Encounter (HOSPITAL_COMMUNITY): Payer: Self-pay | Admitting: Emergency Medicine

## 2019-12-21 ENCOUNTER — Inpatient Hospital Stay (HOSPITAL_COMMUNITY)
Admission: EM | Admit: 2019-12-21 | Discharge: 2019-12-24 | DRG: 638 | Disposition: A | Discharge status: Home / Self Care | Payer: BC Managed Care – PPO | Attending: Internal Medicine | Admitting: Internal Medicine

## 2019-12-21 DIAGNOSIS — E11621 Type 2 diabetes mellitus with foot ulcer: Principal | ICD-10-CM | POA: Diagnosis present

## 2019-12-21 DIAGNOSIS — I87331 Chronic venous hypertension (idiopathic) with ulcer and inflammation of right lower extremity: Secondary | ICD-10-CM | POA: Diagnosis present

## 2019-12-21 DIAGNOSIS — L97919 Non-pressure chronic ulcer of unspecified part of right lower leg with unspecified severity: Secondary | ICD-10-CM | POA: Diagnosis present

## 2019-12-21 DIAGNOSIS — E213 Hyperparathyroidism, unspecified: Secondary | ICD-10-CM | POA: Diagnosis present

## 2019-12-21 DIAGNOSIS — E119 Type 2 diabetes mellitus without complications: Secondary | ICD-10-CM

## 2019-12-21 DIAGNOSIS — E1142 Type 2 diabetes mellitus with diabetic polyneuropathy: Secondary | ICD-10-CM | POA: Diagnosis present

## 2019-12-21 DIAGNOSIS — E1165 Type 2 diabetes mellitus with hyperglycemia: Secondary | ICD-10-CM | POA: Diagnosis present

## 2019-12-21 DIAGNOSIS — E669 Obesity, unspecified: Secondary | ICD-10-CM | POA: Diagnosis present

## 2019-12-21 DIAGNOSIS — E1122 Type 2 diabetes mellitus with diabetic chronic kidney disease: Secondary | ICD-10-CM | POA: Diagnosis present

## 2019-12-21 DIAGNOSIS — Z882 Allergy status to sulfonamides status: Secondary | ICD-10-CM | POA: Diagnosis not present

## 2019-12-21 DIAGNOSIS — L089 Local infection of the skin and subcutaneous tissue, unspecified: Secondary | ICD-10-CM

## 2019-12-21 DIAGNOSIS — E1169 Type 2 diabetes mellitus with other specified complication: Secondary | ICD-10-CM | POA: Diagnosis present

## 2019-12-21 DIAGNOSIS — L03115 Cellulitis of right lower limb: Secondary | ICD-10-CM | POA: Diagnosis present

## 2019-12-21 DIAGNOSIS — Z89421 Acquired absence of other right toe(s): Secondary | ICD-10-CM

## 2019-12-21 DIAGNOSIS — Z66 Do not resuscitate: Secondary | ICD-10-CM | POA: Diagnosis present

## 2019-12-21 DIAGNOSIS — G4730 Sleep apnea, unspecified: Secondary | ICD-10-CM | POA: Diagnosis present

## 2019-12-21 DIAGNOSIS — R52 Pain, unspecified: Secondary | ICD-10-CM | POA: Diagnosis not present

## 2019-12-21 DIAGNOSIS — L97411 Non-pressure chronic ulcer of right heel and midfoot limited to breakdown of skin: Secondary | ICD-10-CM | POA: Diagnosis present

## 2019-12-21 DIAGNOSIS — Z940 Kidney transplant status: Secondary | ICD-10-CM

## 2019-12-21 DIAGNOSIS — Z79891 Long term (current) use of opiate analgesic: Secondary | ICD-10-CM

## 2019-12-21 DIAGNOSIS — E785 Hyperlipidemia, unspecified: Secondary | ICD-10-CM | POA: Diagnosis present

## 2019-12-21 DIAGNOSIS — G4733 Obstructive sleep apnea (adult) (pediatric): Secondary | ICD-10-CM | POA: Diagnosis present

## 2019-12-21 DIAGNOSIS — Z794 Long term (current) use of insulin: Secondary | ICD-10-CM | POA: Diagnosis not present

## 2019-12-21 DIAGNOSIS — Z79899 Other long term (current) drug therapy: Secondary | ICD-10-CM

## 2019-12-21 DIAGNOSIS — Z20822 Contact with and (suspected) exposure to covid-19: Secondary | ICD-10-CM | POA: Diagnosis present

## 2019-12-21 DIAGNOSIS — E11628 Type 2 diabetes mellitus with other skin complications: Secondary | ICD-10-CM

## 2019-12-21 DIAGNOSIS — Z6833 Body mass index (BMI) 33.0-33.9, adult: Secondary | ICD-10-CM

## 2019-12-21 DIAGNOSIS — I129 Hypertensive chronic kidney disease with stage 1 through stage 4 chronic kidney disease, or unspecified chronic kidney disease: Secondary | ICD-10-CM | POA: Diagnosis present

## 2019-12-21 DIAGNOSIS — I10 Essential (primary) hypertension: Secondary | ICD-10-CM | POA: Diagnosis present

## 2019-12-21 DIAGNOSIS — N1831 Chronic kidney disease, stage 3a: Secondary | ICD-10-CM | POA: Diagnosis present

## 2019-12-21 DIAGNOSIS — I872 Venous insufficiency (chronic) (peripheral): Secondary | ICD-10-CM | POA: Diagnosis present

## 2019-12-21 DIAGNOSIS — A419 Sepsis, unspecified organism: Secondary | ICD-10-CM

## 2019-12-21 HISTORY — DX: Venous insufficiency (chronic) (peripheral): I87.2

## 2019-12-21 HISTORY — DX: Sleep apnea, unspecified: G47.30

## 2019-12-21 LAB — CBC
HCT: 38.4 % — ABNORMAL LOW (ref 39.0–52.0)
Hemoglobin: 12.6 g/dL — ABNORMAL LOW (ref 13.0–17.0)
MCH: 27.5 pg (ref 26.0–34.0)
MCHC: 32.8 g/dL (ref 30.0–36.0)
MCV: 83.7 fL (ref 80.0–100.0)
Platelets: 149 10*3/uL — ABNORMAL LOW (ref 150–400)
RBC: 4.59 MIL/uL (ref 4.22–5.81)
RDW: 14.8 % (ref 11.5–15.5)
WBC: 4.4 10*3/uL (ref 4.0–10.5)
nRBC: 0 % (ref 0.0–0.2)

## 2019-12-21 LAB — BASIC METABOLIC PANEL
Anion gap: 14 (ref 5–15)
BUN: 54 mg/dL — ABNORMAL HIGH (ref 8–23)
CO2: 23 mmol/L (ref 22–32)
Calcium: 9.4 mg/dL (ref 8.9–10.3)
Chloride: 103 mmol/L (ref 98–111)
Creatinine, Ser: 2.14 mg/dL — ABNORMAL HIGH (ref 0.61–1.24)
GFR calc Af Amer: 37 mL/min — ABNORMAL LOW (ref >60)
GFR calc non Af Amer: 32 mL/min — ABNORMAL LOW (ref >60)
Glucose, Bld: 85 mg/dL (ref 70–99)
Potassium: 4 mmol/L (ref 3.5–5.1)
Sodium: 140 mmol/L (ref 135–145)

## 2019-12-21 LAB — HEMOGLOBIN A1C
Hgb A1c MFr Bld: 9.4 % — ABNORMAL HIGH (ref 4.8–5.6)
Mean Plasma Glucose: 223.08 mg/dL

## 2019-12-21 LAB — CBG MONITORING, ED
Glucose-Capillary: 84 mg/dL (ref 70–99)
Glucose-Capillary: 151 mg/dL — ABNORMAL HIGH (ref 70–99)

## 2019-12-21 LAB — SARS CORONAVIRUS 2 BY RT PCR (HOSPITAL ORDER, PERFORMED IN ~~LOC~~ HOSPITAL LAB): SARS Coronavirus 2: NEGATIVE

## 2019-12-21 LAB — LACTIC ACID, PLASMA: Lactic Acid, Venous: 2.9 mmol/L (ref 0.5–1.9)

## 2019-12-21 MED ORDER — ONDANSETRON HCL 4 MG PO TABS: 4.0000 mg | ORAL_TABLET | Freq: Four times a day (QID) | ORAL | Status: DC | PRN

## 2019-12-21 MED ORDER — PIPERACILLIN-TAZOBACTAM 3.375 G IVPB 30 MIN
3.3750 g | Freq: Once | INTRAVENOUS | Status: AC
  Administered 2019-12-21: 3.375 g via INTRAVENOUS
  Filled 2019-12-21: qty 50

## 2019-12-21 MED ORDER — LACTATED RINGERS IV BOLUS (SEPSIS)
500.0000 mL | Freq: Once | INTRAVENOUS | Status: AC
  Administered 2019-12-21: 500 mL via INTRAVENOUS

## 2019-12-21 MED ORDER — ACETAMINOPHEN 325 MG PO TABS: 650.0000 mg | ORAL_TABLET | Freq: Four times a day (QID) | ORAL | Status: DC | PRN

## 2019-12-21 MED ORDER — HEPARIN SODIUM (PORCINE) 5000 UNIT/ML IJ SOLN
5000.0000 [IU] | Freq: Three times a day (TID) | INTRAMUSCULAR | Status: DC
  Administered 2019-12-23: 5000 [IU] via SUBCUTANEOUS
  Filled 2019-12-21 (×2): qty 1

## 2019-12-21 MED ORDER — INSULIN ASPART 100 UNIT/ML ~~LOC~~ SOLN
0.0000 [IU] | Freq: Three times a day (TID) | SUBCUTANEOUS | Status: DC
  Administered 2019-12-22 (×3): 5 [IU] via SUBCUTANEOUS
  Administered 2019-12-23: 7 [IU] via SUBCUTANEOUS
  Administered 2019-12-23: 2 [IU] via SUBCUTANEOUS
  Administered 2019-12-23: 5 [IU] via SUBCUTANEOUS
  Administered 2019-12-24 (×2): 3 [IU] via SUBCUTANEOUS

## 2019-12-21 MED ORDER — SODIUM CHLORIDE 0.9 % IV SOLN
2.0000 g | Freq: Two times a day (BID) | INTRAVENOUS | Status: DC
  Administered 2019-12-21 – 2019-12-24 (×6): 2 g via INTRAVENOUS
  Filled 2019-12-21 (×6): qty 2

## 2019-12-21 MED ORDER — LACTATED RINGERS IV BOLUS (SEPSIS)
1000.0000 mL | Freq: Once | INTRAVENOUS | Status: AC
  Administered 2019-12-21: 1000 mL via INTRAVENOUS

## 2019-12-21 MED ORDER — ONDANSETRON HCL 4 MG/2ML IJ SOLN: 4.0000 mg | Freq: Four times a day (QID) | INTRAMUSCULAR | Status: DC | PRN

## 2019-12-21 MED ORDER — VANCOMYCIN HCL 1250 MG/250ML IV SOLN
1250.0000 mg | INTRAVENOUS | Status: DC
  Administered 2019-12-22 – 2019-12-23 (×3): 1250 mg via INTRAVENOUS
  Filled 2019-12-21 (×3): qty 250

## 2019-12-21 MED ORDER — INSULIN ASPART 100 UNIT/ML ~~LOC~~ SOLN
0.0000 [IU] | Freq: Every day | SUBCUTANEOUS | Status: DC
  Administered 2019-12-22: 4 [IU] via SUBCUTANEOUS
  Administered 2019-12-23: 2 [IU] via SUBCUTANEOUS

## 2019-12-21 MED ORDER — ACETAMINOPHEN 650 MG RE SUPP: 650.0000 mg | Freq: Four times a day (QID) | RECTAL | Status: DC | PRN

## 2019-12-21 NOTE — ED Notes (Signed)
EDP at bedside  

## 2019-12-21 NOTE — ED Notes (Signed)
Patient given bag meal with drink. 

## 2019-12-21 NOTE — ED Provider Notes (Signed)
[facility] EMERGENCY DEPARTMENT Provider Note   CSN: [id number] Arrival date & time: 12/21/19  1731     History Chief Complaint  Patient presents with  . Wound Infection    Steven [name] is a 64 y.o. male.  HPI  64 year old male diabetes, status post kidney transplant, have osteomyelitis of the right foot status post amputation of second toe presents today with complaints of new wound to heel of right foot.  He wore shoes that he was on accustomed to wearing about 3 weeks ago.  He then had a large blister on his right heel.  It has had increased redness.  He was seen at his primary care office and started on Keflex on Thursday.  However, he has had increased redness, warmth, redness spreading proximally, and foul odor from the area.  He has not had fever, chills, nausea, vomiting, or diarrhea.     Past Medical History:  Diagnosis Date  . Diabetes ([place])   . 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 Corliss Parish.  . Hyperlipidemia   . Hyperparathyroidism ([place])   . Hypertension   . Kidney transplant recipient   . Obesity     Patient Active Problem List   Diagnosis Date Noted  . Obesity, Class III, BMI 40-49.9 (morbid obesity) ([place])   . Osteomyelitis ([place]) 07/17/2017  . Acute on chronic kidney failure ([place]) 07/17/2017  . Kidney transplant recipient 05/24/2015  . Diabetes mellitus ([place]) 05/10/2012  . Hyperlipidemia 05/10/2012  . Essential hypertension 05/10/2012    Past Surgical History:  Procedure Laterality Date  . AMPUTATION Right 07/19/2017   Procedure: AMPUTATION RIGHT 2ND TOE;  Surgeon: Leandrew Koyanagi, MD;  Location: [place];  Service: Orthopedics;  Laterality: Right;  . KIDNEY TRANSPLANT  2006  . NEPHRECTOMY TRANSPLANTED ORGAN         History reviewed. No pertinent family history.  Social History   Tobacco Use  . Smoking status: Never Smoker  . Smokeless tobacco: Former Systems developer    Types: Chew    Substance Use Topics  . Alcohol use: No  . Drug use: No    Home Medications Prior to Admission medications   Medication Sig Start Date End Date Taking? Authorizing Provider  albuterol (PROVENTIL HFA;VENTOLIN HFA) 108 (90 BASE) MCG/ACT inhaler Inhale 2 puffs into the lungs every 6 (six) hours as needed for wheezing. 05/17/12 05/17/13  Shawnee Knapp, MD  atorvastatin (LIPITOR) 10 MG tablet Take 10 mg by mouth daily.  07/07/17   [provider]  cephALEXin (KEFLEX) 500 MG capsule Take 1 capsule (500 mg total) by mouth 4 (four) times daily. 12/19/19   Tasia Catchings, Amy V, PA-C  docusate sodium (COLACE) 100 MG capsule Take 1 capsule (100 mg total) by mouth 2 (two) times daily. 07/20/17   Barton Dubois, MD  fenofibrate (TRICOR) 48 MG tablet Take 48 mg by mouth daily.    [provider]  furosemide (LASIX) 40 MG tablet Take 40 mg by mouth daily as needed for fluid (swelling).  07/06/17   [provider]  glyBURIDE (DIABETA) 5 MG tablet Take 5 mg by mouth daily with breakfast.    [provider]  HYDROcodone-acetaminophen (NORCO/VICODIN) 5-325 MG tablet Take 1-2 tablets by mouth every 6 (six) hours as needed for severe pain. 07/20/17   Barton Dubois, MD  insulin aspart (NOVOLOG) 100 UNIT/ML injection Inject 0-35 Units into the skin 3 (three) times daily as needed for high blood sugar.  Sliding scale     [provider]  methocarbamol (ROBAXIN) 500 MG tablet Take 1 tablet (500 mg total) by mouth every 8 (eight) hours as needed for muscle spasms. 07/20/17   Barton Dubois, MD  methocarbamol (ROBAXIN) 500 MG tablet Take 1 tablet (500 mg total) by mouth every 6 (six) hours as needed for muscle spasms. 08/10/17   Leandrew Koyanagi, MD  mupirocin ointment (BACTROBAN) 2 % Apply 1 application topically 2 (two) times daily. 12/19/19   Ok Edwards, PA-C  mycophenolate (MYFORTIC) 360 MG TBEC Take 360 mg by mouth 2 (two) times daily.    [provider]  omeprazole (PRILOSEC) 20 MG  capsule Take 20 mg by mouth daily.  07/03/17   [provider]  tacrolimus (PROGRAF) 1 MG capsule Take 1-2 mg by mouth 2 (two) times daily. Take 2 capsules (2 gm) in the am and Take 1 capsule (1 gm) at bedtime    [provider]    Allergies    Sulfa antibiotics  Review of Systems   Review of Systems  All other systems reviewed and are negative.   Physical Exam Updated Vital Signs BP (!) 160/54 (BP Location: Right Arm)   Pulse 76   Temp 98.1 F (36.7 C) (Oral)   Resp 16   Ht 1.803 m (5\' 11" )   Wt 108.9 kg   SpO2 100%   BMI 33.47 kg/m   Physical Exam Vitals and nursing note reviewed.  Constitutional:      Appearance: Normal appearance.  HENT:     Head: Normocephalic and atraumatic.     Right Ear: External ear normal.     Left Ear: External ear normal.     Nose: Nose normal.     Mouth/Throat:     Mouth: Mucous membranes are moist.     Pharynx: Oropharynx is clear.  Eyes:     Pupils: Pupils are equal, round, and reactive to light.  Cardiovascular:     Rate and Rhythm: Normal rate.     Pulses: Normal pulses.  Pulmonary:     Effort: Pulmonary effort is normal.     Breath sounds: Normal breath sounds.  Abdominal:     General: Abdomen is flat.     Palpations: Abdomen is soft.  Musculoskeletal:        General: Normal range of motion.     Cervical back: Normal range of motion.     Comments: Right foot with 3 x 1 cm wound on heel There is warmth to this area There is erythema and is spreading proximally up the lower one third of lower extremity DP pulses are 2+   Skin:    General: Skin is warm and dry.     Capillary Refill: Capillary refill takes less than 2 seconds.  Neurological:     General: No focal deficit present.     Mental Status: He is alert and oriented to person, place, and time.     Cranial Nerves: No cranial nerve deficit.     ED Results / Procedures / Treatments   Labs (all labs ordered are listed, but only abnormal results are  displayed) Labs Reviewed  CULTURE, BLOOD (ROUTINE X 2)  CULTURE, BLOOD (ROUTINE X 2)  CBC  BASIC METABOLIC PANEL  LACTIC ACID, PLASMA    EKG EKG Interpretation  Date/Time:  Saturday Dec 21 2019 18:43:18 EDT Ventricular Rate:  71 PR Interval:    QRS Duration: 91 QT Interval:  418 QTC Calculation: 455  R Axis:   -21 Text Interpretation: Normal sinus rhythm No significant change since last tracing Confirmed by Pattricia Boss [phone]) on 12/21/2019 7:59:10 PM   Radiology DG Chest 1 View  Result Date: 12/21/2019 CLINICAL DATA:  Right calcaneal wound for 3 weeks EXAM: CHEST  1 VIEW COMPARISON:  10/02/2013 FINDINGS: The heart size and mediastinal contours are within normal limits. Both lungs are clear. The visualized skeletal structures are unremarkable. IMPRESSION: No active disease. Electronically Signed   By: Randa Ngo M.D.   On: 12/21/2019 18:31   DG Foot 2 Views Right  Result Date: 12/21/2019 CLINICAL DATA:  Right calcaneal wound for 3 weeks, infection EXAM: RIGHT FOOT - 2 VIEW COMPARISON:  07/17/2017 FINDINGS: Frontal and lateral views of the right foot are obtained. 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 There are no acute displaced fractures. 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 Prominent enthesopathic changes of the calcaneus. There is soft tissue edema of the plantar and dorsal aspects of the hindfoot. I do not see any subcutaneous gas. No underlying bony destruction or periosteal reaction to suggest osteomyelitis. IMPRESSION: 1. No acute or destructive bony lesions. No radiographic evidence of osteomyelitis. 2. Hindfoot soft tissue swelling. 3. Osteoarthritis. Electronically Signed   By: Randa Ngo M.D.   On: 12/21/2019 18:30    Procedures .Critical Care Performed by: Pattricia Boss, MD Authorized by: Pattricia Boss, MD   Critical care provider statement:    Critical care time (minutes):  45   Critical care end time:  12/21/2019 8:09  PM   Critical care was necessary to treat or prevent imminent or life-threatening deterioration of the following conditions:  Renal failure and sepsis   Critical care was time spent personally by me on the following activities:  Discussions with consultants, evaluation of patient's response to treatment, examination of patient, ordering and performing treatments and interventions, ordering and review of laboratory studies, ordering and review of radiographic studies, pulse oximetry, re-evaluation of patient's condition, obtaining history from patient or surrogate and review of old charts   (including critical care time)  Medications Ordered in ED Medications  piperacillin-tazobactam (ZOSYN) IVPB 3.375 g (has no administration in time range)    ED Course  I have reviewed the triage vital signs and the nursing notes.  Pertinent labs & imaging results that were available during my care of the patient were reviewed by me and considered in my medical decision making (see chart for details). Sepsis - Repeat Assessment  Performed at:    7:46 PM   Vitals     Blood pressure 130/62, pulse (!) 36, temperature 98.1 F (36.7 C), temperature source Oral, resp. rate 18, height 1.803 m (5\' 11" ), weight 108.9 kg, SpO2 100 %.  Heart:     Regular rate and rhythm  Lungs:    CTA  Capillary Refill:   <2 sec  Peripheral Pulse:   Radial intact  Skin:     Normal Color    Clinical Course as of Dec 21 1947  Sat Dec 21, 2019  1948 Creatinine reviewed and stableLactic acid elevated-fluid bolus pending   [DR]  1948 Chest x-Clifton Safley images and radiology interpretation reviewedFoot x-Shacoya Burkhammer radiology interpretation and images reviewed   [DR]    Clinical Course User Index [DR] Pattricia Boss, MD   MDM Rules/Calculators/A&P                      64 year old male status post renal transplant, on immunosuppressive drugs,  diabetic presents today with right foot wound.  He has been on outpatient antibiotics and has  worsening symptoms.  Here in the ED, heart rate, blood pressure, and temperature are normal.  Patient has refused received IV Zosyn.  Initial lactic acid 2.9.  Patient has had fluids bolus ordered.    Plan admission for ongoing treatment and evaluation  1- sepsis- right diabetic foot ulcer 2- ho renal tx- creatinine stable, although bun elevated and patient likely with some volume depletion Discussed care with Dr. Mcarthur Rossetti and he will see for admission  Final Clinical Impression(s) / ED Diagnoses Final diagnoses:  Diabetic infection of right foot ([place])  Sepsis, due to unspecified organism, unspecified whether acute organ dysfunction present [facility])    Rx / DC Orders ED Discharge Orders    None       Pattricia Boss, MD 12/21/19 2009

## 2019-12-21 NOTE — ED Triage Notes (Signed)
C/o wound on R heel x 3 weeks.  Seen at [facility] on 5/20 and taking antibiotic.  Family concerned that infection is worse.

## 2019-12-21 NOTE — H&P (Signed)
History and Physical   Steven [name] PRF:[id number] DOB: [birth year] DOA: 12/21/2019  Referring MD/NP/PA: Dr. Jeanell Sparrow  PCP: Corliss Parish, MD   Outpatient Specialists: Dr. Erlinda Hong, [place], orthopedics  Patient coming from: Home  Chief Complaint: Cellulitis and wound of the right heel  HPI: Steven [name] is a 64 y.o. male with medical history significant of previous osteomyelitis, kidney transplant, hypertension, morbid obesity, hyperparathyroidism, hyperlipidemia, diabetes who was seen in the urgent care center 2 days ago with open wound of the right heel.  Was treated with antibiotics.  He came back today to the ER with worsening symptoms.  Symptoms started about 3 weeks ago when he wore some tight fitting shoes that he was not used to.  A blister develop in the right heel.  This has continually worsened.  He was seen by his PCP and given Keflex couple of days ago.  This has not resolved the problem.  He denies fever or chills but leg has been red and weak.  He has had previous osteomyelitis on the left side and is worried.  Patient is seen in the ER and appears to have rapidly expanding cellulitis.  Due to his significant risk factors including diabetes and immunocompromise status from kidney transplant and suppressions patient is being admitted to the hospital for aggressive IV antibiotic treatment.  ED Course: Temperature is 98.3 blood pressure 167/88 pulse 83 respirate of 20 oxygen sat 100% room air.  Sodium 140 potassium 4.0 chloride 106 CO2 23 with glucose 85.  Creatinine is 2.14 which is his baseline BUN 54.  White count 4.4 hemoglobin 12.6 and platelet 149.  A1c of 9.4.  COVID-19 screening so far negative.  Chest x-ray is negative.  X-ray of the right foot shows no acute bony lesions.  There is tissue swelling and osteoarthritis of the hindfoot.  Review of Systems: As per HPI otherwise 10 point review of systems negative.    Past Medical History:  Diagnosis Date  . Diabetes ([place])    . 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 Corliss Parish.  . Hyperlipidemia   . Hyperparathyroidism ([place])   . Hypertension   . Kidney transplant recipient   . Obesity   . Sleep apnea     Past Surgical History:  Procedure Laterality Date  . AMPUTATION Right 07/19/2017   Procedure: AMPUTATION RIGHT 2ND TOE;  Surgeon: Leandrew Koyanagi, MD;  Location: [place];  Service: Orthopedics;  Laterality: Right;  . KIDNEY TRANSPLANT  2006  . NEPHRECTOMY TRANSPLANTED ORGAN       reports that he has never smoked. He has quit using smokeless tobacco.  His smokeless tobacco use included chew. He reports that he does not drink alcohol or use drugs.  Allergies  Allergen Reactions  . Sulfa Antibiotics Rash    History reviewed. No pertinent family history.   Prior to Admission medications   Medication Sig Start Date End Date Taking? Authorizing Provider  albuterol (PROVENTIL HFA;VENTOLIN HFA) 108 (90 BASE) MCG/ACT inhaler Inhale 2 puffs into the lungs every 6 (six) hours as needed for wheezing. 05/17/12 05/17/13  Shawnee Knapp, MD  atorvastatin (LIPITOR) 10 MG tablet Take 10 mg by mouth daily.  07/07/17   [provider]  cephALEXin (KEFLEX) 500 MG capsule Take 1 capsule (500 mg total) by mouth 4 (four) times daily. 12/19/19   Tasia Catchings, Amy V, PA-C  docusate sodium (COLACE) 100 MG capsule Take 1 capsule (100 mg total) by mouth 2 (two)  times daily. 07/20/17   Barton Dubois, MD  fenofibrate (TRICOR) 48 MG tablet Take 48 mg by mouth daily.    [provider]  furosemide (LASIX) 40 MG tablet Take 40 mg by mouth daily as needed for fluid (swelling).  07/06/17   [provider]  glyBURIDE (DIABETA) 5 MG tablet Take 5 mg by mouth daily with breakfast.    [provider]  HYDROcodone-acetaminophen (NORCO/VICODIN) 5-325 MG tablet Take 1-2 tablets by mouth every 6 (six) hours as needed for severe pain. 07/20/17   Barton Dubois, MD  insulin  aspart (NOVOLOG) 100 UNIT/ML injection Inject 0-35 Units into the skin 3 (three) times daily as needed for high blood sugar. Sliding scale     [provider]  methocarbamol (ROBAXIN) 500 MG tablet Take 1 tablet (500 mg total) by mouth every 8 (eight) hours as needed for muscle spasms. 07/20/17   Barton Dubois, MD  methocarbamol (ROBAXIN) 500 MG tablet Take 1 tablet (500 mg total) by mouth every 6 (six) hours as needed for muscle spasms. 08/10/17   Leandrew Koyanagi, MD  mupirocin ointment (BACTROBAN) 2 % Apply 1 application topically 2 (two) times daily. 12/19/19   Ok Edwards, PA-C  mycophenolate (MYFORTIC) 360 MG TBEC Take 360 mg by mouth 2 (two) times daily.    [provider]  omeprazole (PRILOSEC) 20 MG capsule Take 20 mg by mouth daily.  07/03/17   [provider]  tacrolimus (PROGRAF) 1 MG capsule Take 1-2 mg by mouth 2 (two) times daily. Take 2 capsules (2 gm) in the am and Take 1 capsule (1 gm) at bedtime    [provider]    Physical Exam: Vitals:   12/21/19 2130 12/21/19 2200 12/21/19 2217 12/21/19 2300  BP: (!) 155/69 (!) 157/70  (!) 167/88  Pulse: 73 74 74 83  Resp: 12 20 18 16   Temp:   98.3 F (36.8 C) 97.8 F (36.6 C)  TempSrc:   Oral Oral  SpO2: 100% 100% 100% 100%  Weight:      Height:          Constitutional: Morbidly obese no distress Vitals:   12/21/19 2130 12/21/19 2200 12/21/19 2217 12/21/19 2300  BP: (!) 155/69 (!) 157/70  (!) 167/88  Pulse: 73 74 74 83  Resp: 12 20 18 16   Temp:   98.3 F (36.8 C) 97.8 F (36.6 C)  TempSrc:   Oral Oral  SpO2: 100% 100% 100% 100%  Weight:      Height:       Eyes: PERRL, lids and conjunctivae normal ENMT: Mucous membranes are moist. Posterior pharynx clear of any exudate or lesions.Normal dentition.  Neck: normal, supple, no masses, no thyromegaly Respiratory: clear to auscultation bilaterally, no wheezing, no crackles. Normal respiratory effort. No accessory muscle use.  Cardiovascular:  Regular rate and rhythm, no murmurs / rubs / gallops. No extremity edema. 2+ pedal pulses. No carotid bruits.  Abdomen: no tenderness, no masses palpated. No hepatosplenomegaly. Bowel sounds positive.  Musculoskeletal: no clubbing / cyanosis. No joint deformity upper and lower extremities. Good ROM, no contractures. Normal muscle tone.  Right heel ulcer 3 x 1 cm on the heel Skin: Right foot erythema warmth and visible cellulitis up to the lower one third of the right lower extremity Neurologic: CN 2-12 grossly intact. Sensation intact, DTR normal. Strength 5/5 in all 4.  Psychiatric: Normal judgment and insight. Alert and oriented x 3. Normal mood.     Labs on  Admission: I have personally reviewed following labs and imaging studies  CBC: Recent Labs  Lab 12/21/19 1835  WBC 4.4  HGB 12.6*  HCT 38.4*  MCV 83.7  PLT 948*   Basic Metabolic Panel: Recent Labs  Lab 12/21/19 1835  NA 140  K 4.0  CL 103  CO2 23  GLUCOSE 85  BUN 54*  CREATININE 2.14*  CALCIUM 9.4   GFR: Estimated Creatinine Clearance: 44.9 mL/min (A) (by C-G formula based on SCr of 2.14 mg/dL (H)). Liver Function Tests: No results for input(s): AST, ALT, ALKPHOS, BILITOT, PROT, ALBUMIN in the last 168 hours. No results for input(s): LIPASE, AMYLASE in the last 168 hours. No results for input(s): AMMONIA in the last 168 hours. Coagulation Profile: No results for input(s): INR, PROTIME in the last 168 hours. Cardiac Enzymes: No results for input(s): CKTOTAL, CKMB, CKMBINDEX, TROPONINI in the last 168 hours. BNP (last 3 results) No results for input(s): PROBNP in the last 8760 hours. HbA1C: Recent Labs    12/21/19 2331  HGBA1C 9.4*   CBG: Recent Labs  Lab 12/21/19 1929 12/21/19 2206  GLUCAP 84 151*   Lipid Profile: No results for input(s): CHOL, HDL, LDLCALC, TRIG, CHOLHDL, LDLDIRECT in the last 72 hours. Thyroid Function Tests: No results for input(s): TSH, T4TOTAL, FREET4, T3FREE, THYROIDAB in the  last 72 hours. Anemia Panel: No results for input(s): VITAMINB12, FOLATE, FERRITIN, TIBC, IRON, RETICCTPCT in the last 72 hours. Urine analysis:    Component Value Date/Time   COLORURINE AMBER BIOCHEMICALS MAY BE AFFECTED BY COLOR (A) 07/11/2007 [place] 07/11/2007 1851   LABSPEC 1.024 07/11/2007 1851   PHURINE 5.5 07/11/2007 1851   GLUCOSEU 500 (A) 07/11/2007 1851   HGBUR NEGATIVE 07/11/2007 1851   BILIRUBINUR SMALL (A) 07/11/2007 1851   [place] 07/11/2007 1851   PROTEINUR 30 (A) 07/11/2007 1851   UROBILINOGEN 1.0 07/11/2007 1851   NITRITE NEGATIVE 07/11/2007 1851   LEUKOCYTESUR TRACE (A) 07/11/2007 1851   Sepsis Labs: @LABRCNTIP (procalcitonin:4,lacticidven:4) ) Recent Results (from the past 240 hour(s))  SARS Coronavirus 2 by RT PCR (hospital order, performed in [place] hospital lab) Nasopharyngeal Nasopharyngeal Swab     Status: None   Collection Time: 12/21/19  7:46 PM   Specimen: Nasopharyngeal Swab  Result Value Ref Range Status   SARS Coronavirus 2 NEGATIVE NEGATIVE Final    Comment: (NOTE) SARS-CoV-2 target nucleic acids are NOT DETECTED. The SARS-CoV-2 RNA is generally detectable in upper and lower respiratory specimens during the acute phase of infection. The lowest concentration of SARS-CoV-2 viral copies this assay can detect is 250 copies / mL. A negative result does not preclude SARS-CoV-2 infection and should not be used as the sole basis for treatment or other patient management decisions.  A negative result may occur with improper specimen collection / handling, submission of specimen other than nasopharyngeal swab, presence of viral mutation(s) within the areas targeted by this assay, and inadequate number of viral copies (<250 copies / mL). A negative result must be combined with clinical observations, patient history, and epidemiological information. Fact Sheet for Patients:   StrictlyIdeas.no Fact  Sheet for Healthcare Providers: BankingDealers.co.za This test is not yet approved or cleared  by the [place] FDA and has been authorized for detection and/or diagnosis of SARS-CoV-2 by FDA under an Emergency Use Authorization (EUA).  This EUA will remain in effect (meaning this test can be used) for the duration of the COVID-19 declaration under Section 564(b)(1) of the Act, 21  U.S.C. section 360bbb-3(b)(1), unless the authorization is terminated or revoked sooner. Performed at [facility], [place] [address] [place], [place] [address]      Radiological Exams on Admission: DG Chest 1 View  Result Date: 12/21/2019 CLINICAL DATA:  Right calcaneal wound for 3 weeks EXAM: CHEST  1 VIEW COMPARISON:  10/02/2013 FINDINGS: The heart size and mediastinal contours are within normal limits. Both lungs are clear. The visualized skeletal structures are unremarkable. IMPRESSION: No active disease. Electronically Signed   By: Randa Ngo M.D.   On: 12/21/2019 18:31   DG Foot 2 Views Right  Result Date: 12/21/2019 CLINICAL DATA:  Right calcaneal wound for 3 weeks, infection EXAM: RIGHT FOOT - 2 VIEW COMPARISON:  07/17/2017 FINDINGS: Frontal and lateral views of the right foot are obtained. 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 There are no acute displaced fractures. 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 Prominent enthesopathic changes of the calcaneus. There is soft tissue edema of the plantar and dorsal aspects of the hindfoot. I do not see any subcutaneous gas. No underlying bony destruction or periosteal reaction to suggest osteomyelitis. IMPRESSION: 1. No acute or destructive bony lesions. No radiographic evidence of osteomyelitis. 2. Hindfoot soft tissue swelling. 3. Osteoarthritis. Electronically Signed   By: Randa Ngo M.D.   On: 12/21/2019 18:30      Assessment/Plan Principal Problem:   Cellulitis of right foot Active  Problems:   Diabetes mellitus ([place])   Hyperlipidemia   Essential hypertension   Kidney transplant recipient   Obesity, Class III, BMI 40-49.9 (morbid obesity) ([place])     #1 right heel ulcer with cellulitis: Patient will be admitted for aggressive IV antibiotics.  We will stop Vanco and cefepime due to his history of diabetes to cover multiple organisms.  Pharmacy to dose renally.  Obtain blood cultures and monitor.  Wound care consult  #2 diabetes: Initiate sliding scale insulin.  Home regimen as tolerated.  #3 history of kidney transplant: Continue immunosuppressive medications.  Avoid nephrotoxic medications.  #4 essential hypertension: Resume home regimen and maintain.  #5 morbid obesity: Counseling.  #6 hyperlipidemia: Continue home regimen.     DVT prophylaxis: Heparin Code Status: Full code Family Communication: Discussed care with patient no family available Disposition Plan: Home Consults called: Wound care.  May consult his orthopedic surgeon in a.m. Admission status: Inpatient  Severity of Illness: The appropriate patient status for this patient is INPATIENT. Inpatient status is judged to be reasonable and necessary in order to provide the required intensity of service to ensure the patient's safety. The patient's presenting symptoms, physical exam findings, and initial radiographic and laboratory data in the context of their chronic comorbidities is felt to place them at high risk for further clinical deterioration. Furthermore, it is not anticipated that the patient will be medically stable for discharge from the hospital within 2 midnights of admission. The following factors support the patient status of inpatient.   " The patient's presenting symptoms include right heel ulcer. " The worrisome physical exam findings include ulcer on the right heel. " The initial radiographic and laboratory data are worrisome because of no significant findings seen.. " The chronic  co-morbidities include diabetes with kidney transplant.   * I certify that at the point of admission it is my clinical judgment that the patient will require inpatient hospital care spanning beyond 2 midnights from the point of admission due to high intensity of service, high risk for further deterioration and  high frequency of surveillance required.Barbette Merino MD [facility] Pager [phone]  If 7PM-7AM, please contact night-coverage [email or web address] Password [facility]  12/22/2019, 12:04 AM

## 2019-12-21 NOTE — Progress Notes (Signed)
Pt arrived to unit via [facility] from ED.  Pt is A&O, ambulatory, denies  Pain.  Assessed and skin checked.  Oriented to room and unit.  Admission completed.     Pt requests to be DNR.  Pt requests CPAP.  Pt is asking about myfortic and prograf medications.    Wound care orders needed.  Clarification of sepsis protocol vanc order needed - pt s/p kidney transplant.

## 2019-12-21 NOTE — Progress Notes (Signed)
Pharmacy Antibiotic Note  Steven [name] is a 64 y.o. male admitted on 12/21/2019 with cellulitis.  Pharmacy has been consulted for Cefepime and Vancomycin dosing.   Height: 5\' 11"  (180.3 cm) Weight: 108.9 kg (240 lb) IBW/kg (Calculated) : 75.3  Temp (24hrs), Avg:98.1 F (36.7 C), Min:98.1 F (36.7 C), Max:98.1 F (36.7 C)  Recent Labs  Lab 12/21/19 1835  WBC 4.4  CREATININE 2.14*  LATICACIDVEN 2.9*    Estimated Creatinine Clearance: 44.9 mL/min (A) (by C-G formula based on SCr of 2.14 mg/dL (H)).    Allergies  Allergen Reactions  . Sulfa Antibiotics     Antimicrobials this admission: 5/22 Cefepime >>  5/22 Vancomycin >>   Dose adjustments this admission: N/a  Microbiology results: Pending   Plan:  - Cefepime 2g IV q12h  - Vancomycin load not indicated for cellulitis will start Vancomycin 1250mg  IV q24h - Monitor patients renal function and urine output  - De-escalate ABX when appropriate   Thank you for allowing pharmacy to be a part of this patient's care.  Duanne Limerick PharmD. BCPS 12/21/2019 8:19 PM

## 2019-12-22 ENCOUNTER — Encounter (HOSPITAL_COMMUNITY): Payer: Self-pay | Admitting: Internal Medicine

## 2019-12-22 ENCOUNTER — Other Ambulatory Visit: Payer: Self-pay

## 2019-12-22 LAB — COMPREHENSIVE METABOLIC PANEL
ALT: 18 U/L (ref 0–44)
AST: 14 U/L — ABNORMAL LOW (ref 15–41)
Albumin: 3.1 g/dL — ABNORMAL LOW (ref 3.5–5.0)
Alkaline Phosphatase: 85 U/L (ref 38–126)
Anion gap: 9 (ref 5–15)
BUN: 47 mg/dL — ABNORMAL HIGH (ref 8–23)
CO2: 20 mmol/L — ABNORMAL LOW (ref 22–32)
Calcium: 8.3 mg/dL — ABNORMAL LOW (ref 8.9–10.3)
Chloride: 107 mmol/L (ref 98–111)
Creatinine, Ser: 1.97 mg/dL — ABNORMAL HIGH (ref 0.61–1.24)
GFR calc Af Amer: 41 mL/min — ABNORMAL LOW (ref >60)
GFR calc non Af Amer: 35 mL/min — ABNORMAL LOW (ref >60)
Glucose, Bld: 281 mg/dL — ABNORMAL HIGH (ref 70–99)
Potassium: 4.8 mmol/L (ref 3.5–5.1)
Sodium: 136 mmol/L (ref 135–145)
Total Bilirubin: 1.1 mg/dL (ref 0.3–1.2)
Total Protein: 5.6 g/dL — ABNORMAL LOW (ref 6.5–8.1)

## 2019-12-22 LAB — CBC
HCT: 33.6 % — ABNORMAL LOW (ref 39.0–52.0)
HCT: 35.5 % — ABNORMAL LOW (ref 39.0–52.0)
Hemoglobin: 10.9 g/dL — ABNORMAL LOW (ref 13.0–17.0)
Hemoglobin: 11.6 g/dL — ABNORMAL LOW (ref 13.0–17.0)
MCH: 27.1 pg (ref 26.0–34.0)
MCH: 27.2 pg (ref 26.0–34.0)
MCHC: 32.4 g/dL (ref 30.0–36.0)
MCHC: 32.7 g/dL (ref 30.0–36.0)
MCV: 83.1 fL (ref 80.0–100.0)
MCV: 83.6 fL (ref 80.0–100.0)
Platelets: 121 10*3/uL — ABNORMAL LOW (ref 150–400)
Platelets: 124 10*3/uL — ABNORMAL LOW (ref 150–400)
RBC: 4.02 MIL/uL — ABNORMAL LOW (ref 4.22–5.81)
RBC: 4.27 MIL/uL (ref 4.22–5.81)
RDW: 14.7 % (ref 11.5–15.5)
RDW: 14.8 % (ref 11.5–15.5)
WBC: 3.8 10*3/uL — ABNORMAL LOW (ref 4.0–10.5)
WBC: 3.9 10*3/uL — ABNORMAL LOW (ref 4.0–10.5)
nRBC: 0 % (ref 0.0–0.2)
nRBC: 0 % (ref 0.0–0.2)

## 2019-12-22 LAB — LACTIC ACID, PLASMA: Lactic Acid, Venous: 2 mmol/L (ref 0.5–1.9)

## 2019-12-22 LAB — GLUCOSE, CAPILLARY
Glucose-Capillary: 269 mg/dL — ABNORMAL HIGH (ref 70–99)
Glucose-Capillary: 291 mg/dL — ABNORMAL HIGH (ref 70–99)
Glucose-Capillary: 269 mg/dL — ABNORMAL HIGH (ref 70–99)
Glucose-Capillary: 310 mg/dL — ABNORMAL HIGH (ref 70–99)

## 2019-12-22 LAB — CREATININE, SERUM
Creatinine, Ser: 2 mg/dL — ABNORMAL HIGH (ref 0.61–1.24)
GFR calc Af Amer: 40 mL/min — ABNORMAL LOW (ref >60)
GFR calc non Af Amer: 35 mL/min — ABNORMAL LOW (ref >60)

## 2019-12-22 LAB — HIV ANTIBODY (ROUTINE TESTING W REFLEX): HIV Screen 4th Generation wRfx: NONREACTIVE

## 2019-12-22 MED ORDER — INSULIN ASPART 100 UNIT/ML ~~LOC~~ SOLN
7.0000 [IU] | Freq: Three times a day (TID) | SUBCUTANEOUS | Status: DC
  Administered 2019-12-22 – 2019-12-24 (×7): 7 [IU] via SUBCUTANEOUS

## 2019-12-22 MED ORDER — DOCUSATE SODIUM 100 MG PO CAPS
100.0000 mg | ORAL_CAPSULE | Freq: Two times a day (BID) | ORAL | Status: DC
  Administered 2019-12-22 – 2019-12-24 (×6): 100 mg via ORAL
  Filled 2019-12-22 (×6): qty 1

## 2019-12-22 MED ORDER — GLYBURIDE 5 MG PO TABS
5.0000 mg | ORAL_TABLET | Freq: Two times a day (BID) | ORAL | Status: DC
  Administered 2019-12-22: 5 mg via ORAL
  Filled 2019-12-22: qty 1

## 2019-12-22 MED ORDER — PANTOPRAZOLE SODIUM 40 MG PO TBEC
40.0000 mg | DELAYED_RELEASE_TABLET | Freq: Every day | ORAL | Status: DC
  Administered 2019-12-22 – 2019-12-24 (×3): 40 mg via ORAL
  Filled 2019-12-22 (×3): qty 1

## 2019-12-22 MED ORDER — ATORVASTATIN CALCIUM 10 MG PO TABS
10.0000 mg | ORAL_TABLET | Freq: Every day | ORAL | Status: DC
  Administered 2019-12-22 – 2019-12-24 (×3): 10 mg via ORAL
  Filled 2019-12-22 (×3): qty 1

## 2019-12-22 MED ORDER — ALBUTEROL SULFATE (2.5 MG/3ML) 0.083% IN NEBU: 2.5000 mg | INHALATION_SOLUTION | Freq: Four times a day (QID) | RESPIRATORY_TRACT | Status: DC | PRN

## 2019-12-22 MED ORDER — FENOFIBRATE 54 MG PO TABS
54.0000 mg | ORAL_TABLET | Freq: Every day | ORAL | Status: DC
  Administered 2019-12-22 – 2019-12-24 (×3): 54 mg via ORAL
  Filled 2019-12-22 (×3): qty 1

## 2019-12-22 MED ORDER — MYCOPHENOLATE SODIUM 180 MG PO TBEC
360.0000 mg | DELAYED_RELEASE_TABLET | Freq: Two times a day (BID) | ORAL | Status: DC
  Administered 2019-12-22 – 2019-12-24 (×6): 360 mg via ORAL
  Filled 2019-12-22 (×3): qty 2
  Filled 2019-12-22: qty 1
  Filled 2019-12-22 (×4): qty 2

## 2019-12-22 MED ORDER — TACROLIMUS 1 MG PO CAPS
1.0000 mg | ORAL_CAPSULE | Freq: Two times a day (BID) | ORAL | Status: DC
  Administered 2019-12-22 – 2019-12-24 (×6): 1 mg via ORAL
  Filled 2019-12-22 (×7): qty 1

## 2019-12-22 MED ORDER — HYDROCODONE-ACETAMINOPHEN 5-325 MG PO TABS: 1.0000 | ORAL_TABLET | Freq: Four times a day (QID) | ORAL | Status: DC | PRN

## 2019-12-22 MED ORDER — FUROSEMIDE 40 MG PO TABS: 40.0000 mg | ORAL_TABLET | Freq: Every day | ORAL | Status: DC | PRN

## 2019-12-22 MED ORDER — MUPIROCIN 2 % EX OINT
1.0000 "application " | TOPICAL_OINTMENT | Freq: Two times a day (BID) | CUTANEOUS | Status: DC
  Administered 2019-12-22 – 2019-12-24 (×6): 1 via TOPICAL
  Filled 2019-12-22 (×2): qty 22

## 2019-12-22 MED ORDER — INSULIN ASPART 100 UNIT/ML ~~LOC~~ SOLN: 0.0000 [IU] | Freq: Three times a day (TID) | SUBCUTANEOUS | Status: DC | PRN

## 2019-12-22 NOTE — Progress Notes (Signed)
Progress Note    Steven [name]  LZJ:[id number] DOB: 1956-03-15  DOA: 12/21/2019 PCP: Corliss Parish, MD    Brief Narrative:     Medical records reviewed and are as summarized below:  Steven [name] is an 64 y.o. male with medical history significant of previous osteomyelitis, kidney transplant, hypertension, morbid obesity, hyperparathyroidism, hyperlipidemia, diabetes who was seen in the urgent care center 2 days ago with open wound of the right heel.  Was treated with antibiotics.  He came back today to the ER with worsening symptoms.   Assessment/Plan:   Principal Problem:   Cellulitis of right foot Active Problems:   Diabetes mellitus ([place])   Hyperlipidemia   Essential hypertension   Kidney transplant recipient   Obesity, Class III, BMI 40-49.9 (morbid obesity) ([place])   right heel ulcer with cellulitis: -IV antibiotics.   -wound care consult -x ray with out osteomyelitis -ABIs abnormal in 2018   5/23   5/20      Uncontrolled type 2 diabetes with hyperglycemia - Initiate sliding scale insulin -needs better control  history of kidney transplant with CKD stage 3a: Continue immunosuppressive medications.  Avoid nephrotoxic medications.  essential hypertension: Resume home regimen and maintain.  morbid obesity:  Estimated body mass index is 33.47 kg/m as calculated from the following:   Height as of this encounter: 5\' 11"  (1.803 m).   Weight as of this encounter: 108.9 kg.  OSA -CPAP at night   Family Communication/Anticipated D/C date and plan/Code Status   DVT prophylaxis: heparin Code Status:dnr Disposition Plan: Status is: Inpatient  Remains inpatient appropriate because:IV treatments appropriate due to intensity of illness or inability to take PO   Dispo: The patient is from: Home              Anticipated d/c is to: Home              Anticipated d/c date is: 3 days              Patient currently is not medically stable to  d/c.          Medical Consultants:    None.    Subjective:   Blister started when he wore a new pair of shoes-- wife thought it was looking worse  Objective:    Vitals:   12/21/19 2217 12/21/19 2300 12/22/19 0300 12/22/19 0805  BP:  (!) 167/88 125/70 132/67  Pulse: 74 83 71 66  Resp: 18 16 16 18   Temp: 98.3 F (36.8 C) 97.8 F (36.6 C) 98.2 F (36.8 C) 97.8 F (36.6 C)  TempSrc: Oral Oral Oral Oral  SpO2: 100% 100% 97% 97%  Weight:      Height:        Intake/Output Summary (Last 24 hours) at 12/22/2019 1144 Last data filed at 12/22/2019 0500 Gross per 24 hour  Intake 3825.63 ml  Output 350 ml  Net 3475.63 ml   Filed Weights   12/21/19 1738  Weight: 108.9 kg    Exam:  General: Appearance:    Obese male in no acute distress     Lungs:     Clear to auscultation bilaterally, respirations unlabored  Heart:    Normal heart rate. Normal rhythm. No murmurs, rubs, or gallops.   MS:       Neurologic:   Awake, alert, oriented x 3. No apparent focal neurological           defect.     Data Reviewed:  I have personally reviewed following labs and imaging studies:  Labs: Labs show the following:   Basic Metabolic Panel: Recent Labs  Lab 12/21/19 1835 12/21/19 2331 12/22/19 0312  NA 140  --  136  K 4.0  --  4.8  CL 103  --  107  CO2 23  --  20*  GLUCOSE 85  --  281*  BUN 54*  --  47*  CREATININE 2.14* 2.00* 1.97*  CALCIUM 9.4  --  8.3*   GFR Estimated Creatinine Clearance: 48.8 mL/min (A) (by C-G formula based on SCr of 1.97 mg/dL (H)). Liver Function Tests: Recent Labs  Lab 12/22/19 0312  AST 14*  ALT 18  ALKPHOS 85  BILITOT 1.1  PROT 5.6*  ALBUMIN 3.1*   No results for input(s): LIPASE, AMYLASE in the last 168 hours. No results for input(s): AMMONIA in the last 168 hours. Coagulation profile No results for input(s): INR, PROTIME in the last 168 hours.  CBC: Recent Labs  Lab 12/21/19 1835 12/21/19 2331 12/22/19 0312  WBC 4.4  3.8* 3.9*  HGB 12.6* 11.6* 10.9*  HCT 38.4* 35.5* 33.6*  MCV 83.7 83.1 83.6  PLT 149* 124* 121*   Cardiac Enzymes: No results for input(s): CKTOTAL, CKMB, CKMBINDEX, TROPONINI in the last 168 hours. BNP (last 3 results) No results for input(s): PROBNP in the last 8760 hours. CBG: Recent Labs  Lab 12/21/19 1929 12/21/19 2206 12/22/19 0801  GLUCAP 84 151* 269*   D-Dimer: No results for input(s): DDIMER in the last 72 hours. Hgb A1c: Recent Labs    12/21/19 2331  HGBA1C 9.4*   Lipid Profile: No results for input(s): CHOL, HDL, LDLCALC, TRIG, CHOLHDL, LDLDIRECT in the last 72 hours. Thyroid function studies: No results for input(s): TSH, T4TOTAL, T3FREE, THYROIDAB in the last 72 hours.  Invalid input(s): FREET3 Anemia work up: No results for input(s): VITAMINB12, FOLATE, FERRITIN, TIBC, IRON, RETICCTPCT in the last 72 hours. Sepsis Labs: Recent Labs  Lab 12/21/19 1835 12/21/19 2331 12/22/19 0312  WBC 4.4 3.8* 3.9*  LATICACIDVEN 2.9* 2.0*  --     Microbiology Recent Results (from the past 240 hour(s))  SARS Coronavirus 2 by RT PCR (hospital order, performed in [facility] hospital lab) Nasopharyngeal Nasopharyngeal Swab     Status: None   Collection Time: 12/21/19  7:46 PM   Specimen: Nasopharyngeal Swab  Result Value Ref Range Status   SARS Coronavirus 2 NEGATIVE NEGATIVE Final    Comment: (NOTE) SARS-CoV-2 target nucleic acids are NOT DETECTED. The SARS-CoV-2 RNA is generally detectable in upper and lower respiratory specimens during the acute phase of infection. The lowest concentration of SARS-CoV-2 viral copies this assay can detect is 250 copies / mL. A negative result does not preclude SARS-CoV-2 infection and should not be used as the sole basis for treatment or other patient management decisions.  A negative result may occur with improper specimen collection / handling, submission of specimen other than nasopharyngeal swab, presence of viral mutation(s)  within the areas targeted by this assay, and inadequate number of viral copies (<250 copies / mL). A negative result must be combined with clinical observations, patient history, and epidemiological information. Fact Sheet for Patients:   StrictlyIdeas.no Fact Sheet for Healthcare Providers: BankingDealers.co.za This test is not yet approved or cleared  by the [place] FDA and has been authorized for detection and/or diagnosis of SARS-CoV-2 by FDA under an Emergency Use Authorization (EUA).  This EUA will remain in effect (meaning this test can  be used) for the duration of the COVID-19 declaration under Section 564(b)(1) of the Act, 21 U.S.C. section 360bbb-3(b)(1), unless the authorization is terminated or revoked sooner. Performed at [facility], [place] [address], [place], [place] [address]     Procedures and diagnostic studies:  DG Chest 1 View  Result Date: 12/21/2019 CLINICAL DATA:  Right calcaneal wound for 3 weeks EXAM: CHEST  1 VIEW COMPARISON:  10/02/2013 FINDINGS: The heart size and mediastinal contours are within normal limits. Both lungs are clear. The visualized skeletal structures are unremarkable. IMPRESSION: No active disease. Electronically Signed   By: Randa Ngo M.D.   On: 12/21/2019 18:31   DG Foot 2 Views Right  Result Date: 12/21/2019 CLINICAL DATA:  Right calcaneal wound for 3 weeks, infection EXAM: RIGHT FOOT - 2 VIEW COMPARISON:  07/17/2017 FINDINGS: Frontal and lateral views of the right foot are obtained. 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 There are no acute displaced fractures. 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 Prominent enthesopathic changes of the calcaneus. There is soft tissue edema of the plantar and dorsal aspects of the hindfoot. I do not see any subcutaneous gas. No underlying bony destruction or periosteal reaction to suggest osteomyelitis.  IMPRESSION: 1. No acute or destructive bony lesions. No radiographic evidence of osteomyelitis. 2. Hindfoot soft tissue swelling. 3. Osteoarthritis. Electronically Signed   By: Randa Ngo M.D.   On: 12/21/2019 18:30    Medications:   . atorvastatin  10 mg Oral Daily  . docusate sodium  100 mg Oral BID  . fenofibrate  54 mg Oral Daily  . glyBURIDE  5 mg Oral BID WC  . heparin  5,000 Units Subcutaneous Q8H  . insulin aspart  0-5 Units Subcutaneous QHS  . insulin aspart  0-9 Units Subcutaneous TID WC  . mupirocin ointment  1 application Topical BID  . mycophenolate  360 mg Oral BID  . pantoprazole  40 mg Oral Daily  . tacrolimus  1 mg Oral BID   Continuous Infusions: . ceFEPime (MAXIPIME) IV 2 g (12/22/19 0907)  . vancomycin 1,250 mg (12/22/19 0138)     LOS: 1 day   Geradine Girt  [facility]   How to contact the [facility] Attending or Consulting provider [place] or covering provider during after hours [place], for this patient?  1. Check the care team in [facility] and look for a) attending/consulting TRH provider listed and b) the [facility] team listed 2. Log into [email or web address] and use [place]'s universal password to access. If you do not have the password, please contact the hospital operator. 3. Locate the [facility] provider you are looking for under [facility] and page to a number that you can be directly reached. 4. If you still have difficulty reaching the provider, please page the [facility] (Director on Call) for the Hospitalists listed on amion for assistance.  12/22/2019, 11:44 AM

## 2019-12-22 NOTE — Progress Notes (Signed)
RT set up CPAP and placed on patient. Patient tolerating well at this time. RT will monitor patient as needed.

## 2019-12-23 ENCOUNTER — Inpatient Hospital Stay (HOSPITAL_COMMUNITY): Payer: BC Managed Care – PPO

## 2019-12-23 ENCOUNTER — Encounter (HOSPITAL_COMMUNITY): Payer: BC Managed Care – PPO

## 2019-12-23 LAB — CBC
HCT: 33.2 % — ABNORMAL LOW (ref 39.0–52.0)
Hemoglobin: 10.8 g/dL — ABNORMAL LOW (ref 13.0–17.0)
MCH: 27.3 pg (ref 26.0–34.0)
MCHC: 32.5 g/dL (ref 30.0–36.0)
MCV: 84.1 fL (ref 80.0–100.0)
Platelets: 122 10*3/uL — ABNORMAL LOW (ref 150–400)
RBC: 3.95 MIL/uL — ABNORMAL LOW (ref 4.22–5.81)
RDW: 14.8 % (ref 11.5–15.5)
WBC: 3.7 10*3/uL — ABNORMAL LOW (ref 4.0–10.5)
nRBC: 0 % (ref 0.0–0.2)

## 2019-12-23 LAB — GLUCOSE, CAPILLARY
Glucose-Capillary: 311 mg/dL — ABNORMAL HIGH (ref 70–99)
Glucose-Capillary: 251 mg/dL — ABNORMAL HIGH (ref 70–99)
Glucose-Capillary: 168 mg/dL — ABNORMAL HIGH (ref 70–99)
Glucose-Capillary: 230 mg/dL — ABNORMAL HIGH (ref 70–99)

## 2019-12-23 LAB — BASIC METABOLIC PANEL
Anion gap: 11 (ref 5–15)
BUN: 41 mg/dL — ABNORMAL HIGH (ref 8–23)
CO2: 21 mmol/L — ABNORMAL LOW (ref 22–32)
Calcium: 8.5 mg/dL — ABNORMAL LOW (ref 8.9–10.3)
Chloride: 105 mmol/L (ref 98–111)
Creatinine, Ser: 1.93 mg/dL — ABNORMAL HIGH (ref 0.61–1.24)
GFR calc Af Amer: 42 mL/min — ABNORMAL LOW (ref >60)
GFR calc non Af Amer: 36 mL/min — ABNORMAL LOW (ref >60)
Glucose, Bld: 292 mg/dL — ABNORMAL HIGH (ref 70–99)
Potassium: 5.1 mmol/L (ref 3.5–5.1)
Sodium: 137 mmol/L (ref 135–145)

## 2019-12-23 MED ORDER — GADOBUTROL 1 MMOL/ML IV SOLN
10.0000 mL | Freq: Once | INTRAVENOUS | Status: AC | PRN
  Administered 2019-12-23: 10 mL via INTRAVENOUS

## 2019-12-23 MED ORDER — INSULIN GLARGINE 100 UNIT/ML ~~LOC~~ SOLN
10.0000 [IU] | Freq: Every day | SUBCUTANEOUS | Status: DC
  Administered 2019-12-23 – 2019-12-24 (×2): 10 [IU] via SUBCUTANEOUS
  Filled 2019-12-23 (×2): qty 0.1

## 2019-12-23 NOTE — Progress Notes (Signed)
Patient has CPAP at bedside with water in the humidification chamber and ready to use. Will call if any assistance needed, but says he will place on himself when ready.

## 2019-12-23 NOTE — Progress Notes (Signed)
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  IV antibiotics initiated and wound care consult pending  Assessment/Plan:   Principal Problem:   Cellulitis of right foot Active Problems:   Diabetes mellitus ([place])   Essential hypertension   Kidney transplant recipient   Hyperlipidemia   Obesity, Class III, BMI 40-49.9 (morbid obesity) ([place])   #1.  Cellulitis of the right foot.  Right heel ulcer reportedly started as a blister after wearing new shoes.  X-ray without osteomyelitis.  Cefepime and vancomycin initiated.  He is afebrile hemodynamically stable and nontoxic-appearing -Follow ABIs -Continue IV antibiotics as noted above -Await wound care recommendations  #2.  Diabetes type 2.  Uncontrolled.  Hemoglobin A1c is greater than 9. -Continue sliding scale -Continue meal coverage -We will add 10 units of Lantus -Monitor  #3.  Hypertension.  Fair control.  Home medications include Lasix -Continue Lasix as needed -Monitor  #4.  History of kidney transplant with chronic kidney disease stage IIIa.  Creatinine 1.9 trending down from admission. -Avoid nephrotoxins -Continue home meds -Monitor urine output  #5.  Obstructive sleep apnea.  Uses CPAP at night -CPAP at night    Family Communication/Anticipated D/C date and plan/Code Status   DVT prophylaxis: heparin ordered. Code Status: dnr.  Family Communication: patient Disposition Plan: Status is: Inpatient  Remains inpatient appropriate because:IV treatments appropriate due to intensity of illness or inability to take  PO   Dispo: The patient is from: Home              Anticipated d/c is to: Home              Anticipated d/c date is: 2 days              Patient currently is not medically stable to d/c.   Medical Consultants:    None.   Anti-Infectives:    cefipime 5/23>>>  Vancomycin 5/23>>  Subjective:   Sitting up in chair watching TV.  Denies pain or discomfort.  Reports sleeping a lot better since he got his CPAP machine.  Inquiring about when the wound consult was going to happen  Objective:    Vitals:   12/22/19 1937 12/22/19 2259 12/23/19 0318 12/23/19 0812  BP: (!) 142/69  (!) 116/58 118/60  Pulse: 65 69 62 62  Resp: 18 16 16 16   Temp: 98.1 F (36.7 C)  98.2 F (36.8 C) 98.4 F (36.9 C)  TempSrc: Oral  Oral Oral  SpO2: 100% 99% 99% 99%  Weight:      Height:        Intake/Output Summary (Last 24 hours) at 12/23/2019 1023 Last data filed at 12/23/2019 0500 Gross per 24 hour  Intake 1018.2 ml  Output --  Net 1018.2 ml   Filed Weights   12/21/19 1738  Weight: 108.9 kg    Exam: General: Alert obese sitting in chair no acute distress CV: Regular rate and rhythm +murmur no gallop or rub trace lower extremity edema bilaterally.  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 Respiratory:  No increased work of breathing breath sounds are clear bilaterally I hear no rhonchi no wheezes Abdomen: Obese soft positive bowel sounds throughout no guarding or rebounding to palpation Musculoskeletal: Joints without swelling/erythema full range of motion Neuro: Alert and oriented x3 speech clear facial symmetry  Data Reviewed:   I have personally reviewed following labs and imaging studies:  Labs: Labs show the following:   Basic Metabolic Panel: Recent Labs  Lab 12/21/19 1835 12/21/19 1835 12/21/19 2331 12/22/19 0312 12/23/19 0208  NA 140  --   --  136 137  K 4.0   < >  --  4.8 5.1  CL 103  --   --   107 105  CO2 23  --   --  20* 21*  GLUCOSE 85  --   --  281* 292*  BUN 54*  --   --  47* 41*  CREATININE 2.14*  --  2.00* 1.97* 1.93*  CALCIUM 9.4  --   --  8.3* 8.5*   < > = values in this interval not displayed.   GFR Estimated Creatinine Clearance: 49.8 mL/min (A) (by C-G formula based on SCr of 1.93 mg/dL (H)). Liver Function Tests: Recent Labs  Lab 12/22/19 0312  AST 14*  ALT 18  ALKPHOS 85  BILITOT 1.1  PROT 5.6*  ALBUMIN 3.1*   No results for input(s): LIPASE, AMYLASE in the last 168 hours. No results for input(s): AMMONIA in the last 168 hours. Coagulation profile No results for input(s): INR, PROTIME in the last 168 hours.  CBC: Recent Labs  Lab 12/21/19 1835 12/21/19 2331 12/22/19 0312 12/23/19 0208  WBC 4.4 3.8* 3.9* 3.7*  HGB 12.6* 11.6* 10.9* 10.8*  HCT 38.4* 35.5* 33.6* 33.2*  MCV 83.7 83.1 83.6 84.1  PLT 149* 124* 121* 122*   Cardiac Enzymes: No results for input(s): CKTOTAL, CKMB, CKMBINDEX, TROPONINI in the last 168 hours. BNP (last 3 results) No results for input(s): PROBNP in the last 8760 hours. CBG: Recent Labs  Lab 12/22/19 0801 12/22/19 1209 12/22/19 1608 12/22/19 1936 12/23/19 0810  GLUCAP 269* 291* 269* 310* 311*   D-Dimer: No results for input(s): DDIMER in the last 72 hours. Hgb A1c: Recent Labs    12/21/19 2331  HGBA1C 9.4*   Lipid Profile: No results for input(s): CHOL, HDL, LDLCALC, TRIG, CHOLHDL, LDLDIRECT in the last 72 hours. Thyroid function studies: No results for input(s): TSH, T4TOTAL, T3FREE, THYROIDAB in the last 72 hours.  Invalid input(s): FREET3 Anemia work up: No results for input(s): VITAMINB12, FOLATE, FERRITIN, TIBC, IRON, RETICCTPCT in the last 72 hours. Sepsis Labs: Recent Labs  Lab 12/21/19 1835 12/21/19 2331 12/22/19 0312 12/23/19 0208  WBC 4.4 3.8* 3.9* 3.7*  LATICACIDVEN 2.9* 2.0*  --   --     Microbiology Recent Results (from the past 240 hour(s))  Blood culture (routine x 2)      Status: None (Preliminary result)   Collection Time: 12/21/19  6:35 PM   Specimen: BLOOD  Result Value Ref Range Status   Specimen Description BLOOD SITE NOT SPECIFIED  Final   Special Requests   Final    BOTTLES DRAWN AEROBIC AND ANAEROBIC Blood Culture results may not be optimal due to an excessive volume of blood received in culture bottles   Culture   Final    NO GROWTH 2 DAYS Performed at [facility], [place] [address], [place], [place] [address]    Report Status PENDING  Incomplete  Blood culture (routine  x 2)     Status: None (Preliminary result)   Collection Time: 12/21/19  6:39 PM   Specimen: BLOOD  Result Value Ref Range Status   Specimen Description BLOOD RIGHT ANTECUBITAL  Final   Special Requests   Final    BOTTLES DRAWN AEROBIC AND ANAEROBIC Blood Culture results may not be optimal due to an excessive volume of blood received in culture bottles   Culture   Final    NO GROWTH 2 DAYS Performed at [facility], [place] [address] [place], [place] [address]    Report Status PENDING  Incomplete  SARS Coronavirus 2 by RT PCR (hospital order, performed in [facility] hospital lab) Nasopharyngeal Nasopharyngeal Swab     Status: None   Collection Time: 12/21/19  7:46 PM   Specimen: Nasopharyngeal Swab  Result Value Ref Range Status   SARS Coronavirus 2 NEGATIVE NEGATIVE Final    Comment: (NOTE) SARS-CoV-2 target nucleic acids are NOT DETECTED. The SARS-CoV-2 RNA is generally detectable in upper and lower respiratory specimens during the acute phase of infection. The lowest concentration of SARS-CoV-2 viral copies this assay can detect is 250 copies / mL. A negative result does not preclude SARS-CoV-2 infection and should not be used as the sole basis for treatment or other patient management decisions.  A negative result may occur with improper specimen collection / handling, submission of specimen other than nasopharyngeal swab, presence of viral mutation(s) within  the areas targeted by this assay, and inadequate number of viral copies (<250 copies / mL). A negative result must be combined with clinical observations, patient history, and epidemiological information. Fact Sheet for Patients:   StrictlyIdeas.no Fact Sheet for Healthcare Providers: BankingDealers.co.za This test is not yet approved or cleared  by the [place] FDA and has been authorized for detection and/or diagnosis of SARS-CoV-2 by FDA under an Emergency Use Authorization (EUA).  This EUA will remain in effect (meaning this test can be used) for the duration of the COVID-19 declaration under Section 564(b)(1) of the Act, 21 U.S.C. section 360bbb-3(b)(1), unless the authorization is terminated or revoked sooner. Performed at [facility], [place] [address] [place], [place] [address]     Procedures and diagnostic studies:  DG Chest 1 View  Result Date: 12/21/2019 CLINICAL DATA:  Right calcaneal wound for 3 weeks EXAM: CHEST  1 VIEW COMPARISON:  10/02/2013 FINDINGS: The heart size and mediastinal contours are within normal limits. Both lungs are clear. The visualized skeletal structures are unremarkable. IMPRESSION: No active disease. Electronically Signed   By: Randa Ngo M.D.   On: 12/21/2019 18:31   DG Foot 2 Views Right  Result Date: 12/21/2019 CLINICAL DATA:  Right calcaneal wound for 3 weeks, infection EXAM: RIGHT FOOT - 2 VIEW COMPARISON:  07/17/2017 FINDINGS: Frontal and lateral views of the right foot are obtained. 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 There are no acute displaced fractures. 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 Prominent enthesopathic changes of the calcaneus. There is soft tissue edema of the plantar and dorsal aspects of the hindfoot. I do not see any subcutaneous gas. No underlying bony destruction or periosteal reaction to suggest osteomyelitis. IMPRESSION: 1.  No acute or destructive bony lesions. No radiographic evidence of osteomyelitis. 2. Hindfoot soft tissue swelling. 3. Osteoarthritis. Electronically Signed   By: Randa Ngo M.D.   On: 12/21/2019 18:30    Medications:    atorvastatin  10 mg Oral Daily   docusate  sodium  100 mg Oral BID   fenofibrate  54 mg Oral Daily   heparin  5,000 Units Subcutaneous Q8H   insulin aspart  0-5 Units Subcutaneous QHS   insulin aspart  0-9 Units Subcutaneous TID WC   insulin aspart  7 Units Subcutaneous TID WC   insulin glargine  10 Units Subcutaneous Daily   mupirocin ointment  1 application Topical BID   mycophenolate  360 mg Oral BID   pantoprazole  40 mg Oral Daily   tacrolimus  1 mg Oral BID   Continuous Infusions:  ceFEPime (MAXIPIME) IV 2 g (12/23/19 0851)   vancomycin Stopped (12/22/19 2358)     LOS: 2 days   Radene Gunning NP [facility]   How to contact the [facility] Attending or Consulting provider [place] or covering provider during after hours [place], for this patient?  1. Check the care team in [facility] and look for a) attending/consulting TRH provider listed and b) the [facility] team listed 2. Log into [email or web address] and use [place]'s universal password to access. If you do not have the password, please contact the hospital operator. 3. Locate the [facility] provider you are looking for under [facility] and page to a number that you can be directly reached. 4. If you still have difficulty reaching the provider, please page the [facility] (Director on Call) for the Hospitalists listed on amion for assistance.  12/23/2019, 10:23 AM

## 2019-12-23 NOTE — Consult Note (Signed)
Reason for Consult:Right heel ulcer Referring Physician: [name] is an 64 y.o. male.  HPI: Steven [name] comes in with a 3 week hx/o right heel ulcer. He says this started after wearing a new pair of shoes and developing a blister on the bottom of his heel. When it began to smell and he was having some streaking up his leg he came to the ED for evaluation. He notes some mild pain in it when he bears weight but otherwise it doesn't bother him much. He denies fevers, chills, sweats, N/V.  Past Medical History:  Diagnosis Date  . Diabetes ([place])   . 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 03/21/12 by Dr. Corliss Parish.  . Hyperlipidemia   . Hyperparathyroidism ([place])   . Hypertension   . Kidney transplant recipient   . Obesity   . Sleep apnea     Past Surgical History:  Procedure Laterality Date  . AMPUTATION Right 07/19/2017   Procedure: AMPUTATION RIGHT 2ND TOE;  Surgeon: Leandrew Koyanagi, MD;  Location: [place];  Service: Orthopedics;  Laterality: Right;  . KIDNEY TRANSPLANT  2006  . NEPHRECTOMY TRANSPLANTED ORGAN      History reviewed. No pertinent family history.  Social History:  reports that he has never smoked. He has quit using smokeless tobacco.  His smokeless tobacco use included chew. He reports that he does not drink alcohol or use drugs.  Allergies:  Allergies  Allergen Reactions  . Sulfa Antibiotics Rash    Medications: I have reviewed the patient's current medications.  Results for orders placed or performed during the hospital encounter of 12/21/19 (from the past 48 hour(s))  Blood culture (routine x 2)     Status: None (Preliminary result)   Collection Time: 12/21/19  6:35 PM   Specimen: BLOOD  Result Value Ref Range   Specimen Description BLOOD SITE NOT SPECIFIED    Special Requests      BOTTLES DRAWN AEROBIC AND ANAEROBIC Blood Culture results may not be optimal due to an excessive volume of blood received in culture  bottles   Culture      NO GROWTH 2 DAYS Performed at [facility], [place] [address], [place], [place] [address]    Report Status PENDING   CBC     Status: Abnormal   Collection Time: 12/21/19  6:35 PM  Result Value Ref Range   WBC 4.4 4.0 - 10.5 K/uL   RBC 4.59 4.22 - 5.81 MIL/uL   Hemoglobin 12.6 (L) 13.0 - 17.0 g/dL   HCT 38.4 (L) 39.0 - 52.0 %   MCV 83.7 80.0 - 100.0 fL   MCH 27.5 26.0 - 34.0 pg   MCHC 32.8 30.0 - 36.0 g/dL   RDW 14.8 11.5 - 15.5 %   Platelets 149 (L) 150 - 400 K/uL   nRBC 0.0 0.0 - 0.2 %    Comment: Performed at [facility], [place] [address], [place], [place] [address]  Basic metabolic panel     Status: Abnormal   Collection Time: 12/21/19  6:35 PM  Result Value Ref Range   Sodium 140 135 - 145 mmol/L   Potassium 4.0 3.5 - 5.1 mmol/L   Chloride 103 98 - 111 mmol/L   CO2 23 22 - 32 mmol/L   Glucose, Bld 85 70 - 99 mg/dL    Comment: Glucose reference range applies only to samples taken after fasting for at least 8 hours.   BUN  54 (H) 8 - 23 mg/dL   Creatinine, Ser 2.14 (H) 0.61 - 1.24 mg/dL   Calcium 9.4 8.9 - 10.3 mg/dL   GFR calc non Af Amer 32 (L) >60 mL/min   GFR calc Af Amer 37 (L) >60 mL/min   Anion gap 14 5 - 15    Comment: Performed at [facility] [address], [place], [place] [address]  Lactic acid, plasma     Status: Abnormal   Collection Time: 12/21/19  6:35 PM  Result Value Ref Range   Lactic Acid, Venous 2.9 (HH) 0.5 - 1.9 mmol/L    Comment: CRITICAL RESULT CALLED TO, READ BACK BY AND VERIFIED WITH: T.PHILLIPS,RN 12/21/2019 1918 DAVISB Performed at [facility] [address] [place], [place] [address]   Blood culture (routine x 2)     Status: None (Preliminary result)   Collection Time: 12/21/19  6:39 PM   Specimen: BLOOD  Result Value Ref Range   Specimen Description BLOOD RIGHT ANTECUBITAL    Special Requests      BOTTLES DRAWN AEROBIC AND ANAEROBIC Blood Culture results may not be optimal due to an  excessive volume of blood received in culture bottles   Culture      NO GROWTH 2 DAYS Performed at [facility] [address] [place], [place] [address]    Report Status PENDING   CBG monitoring, ED     Status: None   Collection Time: 12/21/19  7:29 PM  Result Value Ref Range   Glucose-Capillary 84 70 - 99 mg/dL    Comment: Glucose reference range applies only to samples taken after fasting for at least 8 hours.   Comment 1 Notify RN   SARS Coronavirus 2 by RT PCR (hospital order, performed in [facility] hospital lab) Nasopharyngeal Nasopharyngeal Swab     Status: None   Collection Time: 12/21/19  7:46 PM   Specimen: Nasopharyngeal Swab  Result Value Ref Range   SARS Coronavirus 2 NEGATIVE NEGATIVE    Comment: (NOTE) SARS-CoV-2 target nucleic acids are NOT DETECTED. The SARS-CoV-2 RNA is generally detectable in upper and lower respiratory specimens during the acute phase of infection. The lowest concentration of SARS-CoV-2 viral copies this assay can detect is 250 copies / mL. A negative result does not preclude SARS-CoV-2 infection and should not be used as the sole basis for treatment or other patient management decisions.  A negative result may occur with improper specimen collection / handling, submission of specimen other than nasopharyngeal swab, presence of viral mutation(s) within the areas targeted by this assay, and inadequate number of viral copies (<250 copies / mL). A negative result must be combined with clinical observations, patient history, and epidemiological information. Fact Sheet for Patients:   StrictlyIdeas.no Fact Sheet for Healthcare Providers: BankingDealers.co.za This test is not yet approved or cleared  by the [place] FDA and has been authorized for detection and/or diagnosis of SARS-CoV-2 by FDA under an Emergency Use Authorization (EUA).  This EUA will remain in effect (meaning this test  can be used) for the duration of the COVID-19 declaration under Section 564(b)(1) of the Act, 21 U.S.C. section 360bbb-3(b)(1), unless the authorization is terminated or revoked sooner. Performed at [facility], [place] [address], [place], [place] [address]   CBG monitoring, ED     Status: Abnormal   Collection Time: 12/21/19 10:06 PM  Result Value Ref Range   Glucose-Capillary 151 (H) 70 - 99 mg/dL  Comment: Glucose reference range applies only to samples taken after fasting for at least 8 hours.  Hemoglobin A1c     Status: Abnormal   Collection Time: 12/21/19 11:31 PM  Result Value Ref Range   Hgb A1c MFr Bld 9.4 (H) 4.8 - 5.6 %    Comment: (NOTE) Pre diabetes:          5.7%-6.4% Diabetes:              >6.4% Glycemic control for   <7.0% adults with diabetes    Mean Plasma Glucose 223.08 mg/dL    Comment: Performed at [facility] [address], [place], [place] [address]  HIV Antibody (routine testing w rflx)     Status: None   Collection Time: 12/21/19 11:31 PM  Result Value Ref Range   HIV Screen 4th Generation wRfx Non Reactive Non Reactive    Comment: Performed at [facility], [place] [address], [place], [place] [address]  CBC     Status: Abnormal   Collection Time: 12/21/19 11:31 PM  Result Value Ref Range   WBC 3.8 (L) 4.0 - 10.5 K/uL   RBC 4.27 4.22 - 5.81 MIL/uL   Hemoglobin 11.6 (L) 13.0 - 17.0 g/dL   HCT 35.5 (L) 39.0 - 52.0 %   MCV 83.1 80.0 - 100.0 fL   MCH 27.2 26.0 - 34.0 pg   MCHC 32.7 30.0 - 36.0 g/dL   RDW 14.8 11.5 - 15.5 %   Platelets 124 (L) 150 - 400 K/uL    Comment: Immature Platelet Fraction may be clinically indicated, consider ordering this additional test [id number] REPEATED TO VERIFY    nRBC 0.0 0.0 - 0.2 %    Comment: Performed at [facility], [place] [address] [place], [place] [address]  Creatinine, serum     Status: Abnormal   Collection Time: 12/21/19 11:31 PM  Result Value Ref Range   Creatinine, Ser 2.00 (H)  0.61 - 1.24 mg/dL   GFR calc non Af Amer 35 (L) >60 mL/min   GFR calc Af Amer 40 (L) >60 mL/min    Comment: Performed at [facility] [address] [place], [place] [address]  Lactic acid, plasma     Status: Abnormal   Collection Time: 12/21/19 11:31 PM  Result Value Ref Range   Lactic Acid, Venous 2.0 (HH) 0.5 - 1.9 mmol/L    Comment: CRITICAL VALUE NOTED.  VALUE IS CONSISTENT WITH PREVIOUSLY REPORTED AND CALLED VALUE. Performed at [facility], [place] [address], [place], [place] [address]   Comprehensive metabolic panel     Status: Abnormal   Collection Time: 12/22/19  3:12 AM  Result Value Ref Range   Sodium 136 135 - 145 mmol/L   Potassium 4.8 3.5 - 5.1 mmol/L   Chloride 107 98 - 111 mmol/L   CO2 20 (L) 22 - 32 mmol/L   Glucose, Bld 281 (H) 70 - 99 mg/dL    Comment: Glucose reference range applies only to samples taken after fasting for at least 8 hours.   BUN 47 (H) 8 - 23 mg/dL   Creatinine, Ser 1.97 (H) 0.61 - 1.24 mg/dL   Calcium 8.3 (L) 8.9 - 10.3 mg/dL   Total Protein 5.6 (L) 6.5 - 8.1 g/dL   Albumin 3.1 (L) 3.5 - 5.0 g/dL   AST 14 (L) 15 - 41 U/L   ALT 18 0 - 44 U/L   Alkaline Phosphatase 85 38 -  126 U/L   Total Bilirubin 1.1 0.3 - 1.2 mg/dL   GFR calc non Af Amer 35 (L) >60 mL/min   GFR calc Af Amer 41 (L) >60 mL/min   Anion gap 9 5 - 15    Comment: Performed at [facility] [address], [place], [place] [address]  CBC     Status: Abnormal   Collection Time: 12/22/19  3:12 AM  Result Value Ref Range   WBC 3.9 (L) 4.0 - 10.5 K/uL   RBC 4.02 (L) 4.22 - 5.81 MIL/uL   Hemoglobin 10.9 (L) 13.0 - 17.0 g/dL   HCT 33.6 (L) 39.0 - 52.0 %   MCV 83.6 80.0 - 100.0 fL   MCH 27.1 26.0 - 34.0 pg   MCHC 32.4 30.0 - 36.0 g/dL   RDW 14.7 11.5 - 15.5 %   Platelets 121 (L) 150 - 400 K/uL    Comment: Immature Platelet Fraction may be clinically indicated, consider ordering this additional test [id number] REPEATED TO VERIFY    nRBC 0.0 0.0 - 0.2 %     Comment: Performed at [facility], [place] [address], [place], [place] [address]  Glucose, capillary     Status: Abnormal   Collection Time: 12/22/19  8:01 AM  Result Value Ref Range   Glucose-Capillary 269 (H) 70 - 99 mg/dL    Comment: Glucose reference range applies only to samples taken after fasting for at least 8 hours.  Glucose, capillary     Status: Abnormal   Collection Time: 12/22/19 12:09 PM  Result Value Ref Range   Glucose-Capillary 291 (H) 70 - 99 mg/dL    Comment: Glucose reference range applies only to samples taken after fasting for at least 8 hours.  Glucose, capillary     Status: Abnormal   Collection Time: 12/22/19  4:08 PM  Result Value Ref Range   Glucose-Capillary 269 (H) 70 - 99 mg/dL    Comment: Glucose reference range applies only to samples taken after fasting for at least 8 hours.  Glucose, capillary     Status: Abnormal   Collection Time: 12/22/19  7:36 PM  Result Value Ref Range   Glucose-Capillary 310 (H) 70 - 99 mg/dL    Comment: Glucose reference range applies only to samples taken after fasting for at least 8 hours.  Basic metabolic panel     Status: Abnormal   Collection Time: 12/23/19  2:08 AM  Result Value Ref Range   Sodium 137 135 - 145 mmol/L   Potassium 5.1 3.5 - 5.1 mmol/L   Chloride 105 98 - 111 mmol/L   CO2 21 (L) 22 - 32 mmol/L   Glucose, Bld 292 (H) 70 - 99 mg/dL    Comment: Glucose reference range applies only to samples taken after fasting for at least 8 hours.   BUN 41 (H) 8 - 23 mg/dL   Creatinine, Ser 1.93 (H) 0.61 - 1.24 mg/dL   Calcium 8.5 (L) 8.9 - 10.3 mg/dL   GFR calc non Af Amer 36 (L) >60 mL/min   GFR calc Af Amer 42 (L) >60 mL/min   Anion gap 11 5 - 15    Comment: Performed at [facility] [address], [place] [address]  CBC     Status: Abnormal   Collection Time: 12/23/19  2:08 AM  Result Value Ref Range   WBC 3.7 (L) 4.0 - 10.5 K/uL   RBC 3.95 (L) 4.22 - 5.81 MIL/uL  Hemoglobin 10.8 (L) 13.0  - 17.0 g/dL   HCT 33.2 (L) 39.0 - 52.0 %   MCV 84.1 80.0 - 100.0 fL   MCH 27.3 26.0 - 34.0 pg   MCHC 32.5 30.0 - 36.0 g/dL   RDW 14.8 11.5 - 15.5 %   Platelets 122 (L) 150 - 400 K/uL    Comment: Immature Platelet Fraction may be clinically indicated, consider ordering this additional test [id number] REPEATED TO VERIFY    nRBC 0.0 0.0 - 0.2 %    Comment: Performed at [facility], [place] [address] [place], [place] [address]  Glucose, capillary     Status: Abnormal   Collection Time: 12/23/19  8:10 AM  Result Value Ref Range   Glucose-Capillary 311 (H) 70 - 99 mg/dL    Comment: Glucose reference range applies only to samples taken after fasting for at least 8 hours.  Glucose, capillary     Status: Abnormal   Collection Time: 12/23/19 11:38 AM  Result Value Ref Range   Glucose-Capillary 251 (H) 70 - 99 mg/dL    Comment: Glucose reference range applies only to samples taken after fasting for at least 8 hours.    DG Chest 1 View  Result Date: 12/21/2019 CLINICAL DATA:  Right calcaneal wound for 3 weeks EXAM: CHEST  1 VIEW COMPARISON:  10/02/2013 FINDINGS: The heart size and mediastinal contours are within normal limits. Both lungs are clear. The visualized skeletal structures are unremarkable. IMPRESSION: No active disease. Electronically Signed   By: Randa Ngo M.D.   On: 12/21/2019 18:31   DG Foot 2 Views Right  Result Date: 12/21/2019 CLINICAL DATA:  Right calcaneal wound for 3 weeks, infection EXAM: RIGHT FOOT - 2 VIEW COMPARISON:  07/17/2017 FINDINGS: Frontal and lateral views of the right foot are obtained. 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 There are no acute displaced fractures. 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 Prominent enthesopathic changes of the calcaneus. There is soft tissue edema of the plantar and dorsal aspects of the hindfoot. I do not see any subcutaneous gas. No underlying bony destruction or periosteal reaction to  suggest osteomyelitis. IMPRESSION: 1. No acute or destructive bony lesions. No radiographic evidence of osteomyelitis. 2. Hindfoot soft tissue swelling. 3. Osteoarthritis. Electronically Signed   By: Randa Ngo M.D.   On: 12/21/2019 18:30    Review of Systems  Constitutional: Negative for chills, diaphoresis and fever.  HENT: Negative for ear discharge, ear pain, hearing loss and tinnitus.   Eyes: Negative for photophobia and pain.  Respiratory: Negative for cough and shortness of breath.   Cardiovascular: Negative for chest pain.  Gastrointestinal: Negative for abdominal pain, nausea and vomiting.  Genitourinary: Negative for dysuria, flank pain, frequency and urgency.  Musculoskeletal: Positive for arthralgias (Right heel). Negative for back pain, myalgias and neck pain.  Neurological: Negative for dizziness and headaches.  Hematological: Does not bruise/bleed easily.  Psychiatric/Behavioral: The patient is not nervous/anxious.    Blood pressure 118/60, pulse 62, temperature 98.4 F (36.9 C), temperature source Oral, resp. rate 16, height 5\' 11"  (1.803 m), weight 108.9 kg, SpO2 99 %. Physical Exam  Constitutional: He appears well-developed and well-nourished. No distress.  HENT:  Head: Normocephalic and atraumatic.  Eyes: Conjunctivae are normal. Right eye exhibits no discharge. Left eye exhibits no discharge. No scleral icterus.  Cardiovascular: Normal rate and regular rhythm.  Respiratory: Effort normal. No respiratory distress.  Musculoskeletal:     Cervical back: Normal range of  motion.     Comments: RLE No traumatic wounds, ecchymosis, or rash  Surgically absent 2nd toe, large heel ulceration with necrosis, no discharge  No knee or ankle effusion  Knee stable to varus/ valgus and anterior/posterior stress  Sens DPN, SPN, TN intact  Motor EHL, ext, flex, evers 5/5  DP 2+, PT 0, No significant edema  Neurological: He is alert.  Skin: Skin is warm and dry. He is not  diaphoretic.  Psychiatric: He has a normal mood and affect. His behavior is normal.    Assessment/Plan: Right heel ulceration -- Will check MRI to make sure no occult osteo. Dr. Sharol Given to evaluate later today or in AM. Multiple medical problems including kidney transplant, hypertension, morbid obesity, hyperparathyroidism, hyperlipidemia, and diabetes -- per primary service    Lisette Abu, PA-C Orthopedic Surgery [phone] 12/23/2019, 12:38 PM

## 2019-12-23 NOTE — Progress Notes (Signed)
VASCULAR LAB    Patient had non compressible vessels per ABIs 07/19/17.  Please advise if additional ABI should be repeated.     Caster Fayette, RVT 12/23/2019, 2:59 PM

## 2019-12-23 NOTE — Plan of Care (Signed)

## 2019-12-23 NOTE — Consult Note (Signed)
WOC Nurse Consult Note: Patient receiving care in [facility] 5N09.  Spouse in the room at the time of my visit. Reason for Consult: "foot wound" Wound type: diabetic heel wound present x weeks resulting from shoe trauma. Pressure Injury POA: Yes/No/NA Measurement:2.3 cm x 3 cm x unknown depth Wound bed: 100% black eschar with surrounding discoloration of yellow and white.  There was a small amount of serous drainage that spontaneous drained from the wound.  Nothing that looked like pus. Drainage (amount, consistency, odor) serous Periwound: as described Dressing procedure/placement/frequency:  Apply iodine twice daily and place a small foam dressing over the area. Monitor the wound area(s) for worsening of condition such as: Signs/symptoms of infection,  Increase in size,  Development of or worsening of odor, Development of pain, or increased pain at the affected locations.  Notify the medical team if any of these develop.  Thank you for the consult.  Discussed plan of care with the patient and spouse.  [facility] nurse will not follow at this time.  Please re-consult the [facility] team if needed.  Val Riles, RN, MSN, CWOCN, CNS-BC, pager [phone]

## 2019-12-23 NOTE — Progress Notes (Signed)
Pt has been refusing SQ heparin. We will change to SCDs per Dr. Eliseo Squires

## 2019-12-23 NOTE — Progress Notes (Addendum)
Inpatient Diabetes Program Recommendations  AACE/ADA: New Consensus Statement on Inpatient Glycemic Control (2015)  Target Ranges:  Prepandial:   less than 140 mg/dL      Peak postprandial:   less than 180 mg/dL (1-2 hours)      Critically ill patients:  140 - 180 mg/dL   Lab Results  Component Value Date   GLUCAP 311 (H) 12/23/2019   HGBA1C 9.4 (H) 12/21/2019    Review of Glycemic Control Foot wound cellulitis Diabetes history: DM 2 Outpatient Diabetes medications: Glyburide 5 mg bid, Novolog 0-35 units tid Current orders for Inpatient glycemic control:  Novolog 0-9 units tid + hs Novolog 7 units tid  BUN/Creat: 41/1.93 108.9 kg A1c 9.4%  Inpatient Diabetes Program Recommendations:    Consider Levemir 15 units   May benefit from basal insulin at time of d/c.  Addendum 1154 am:  Spoke with pt at bedside. Pt reports being on Lantus at one time but was taken off by his [place] kidney doctor due to elevated renal function. Pt is post transplant 14 years ago. Discussed importance of glucose control on wound healing. Discussed possible need to be placed back on basal insulin at time of d/c. Discussed A1c of 9.4% and discussed glucose and A1c goals. Pt reports checking glucose 4-5x/day. Pt does not have a PCP.   Discussed with pt importance of PCP follow up and possibly Endocrinology referral. Pt went to urgent care and not a PCP for evaluation of leg.  Thanks,  Tama Headings RN, MSN, BC-ADM Inpatient Diabetes Coordinator Team Pager [phone] (8a-5p)

## 2019-12-23 NOTE — Consult Note (Signed)
ORTHOPAEDIC CONSULTATION  REQUESTING PHYSICIAN: Geradine Girt, DO  Chief Complaint: Right heel decubitus ulcer with right leg ulcer  HPI: Steven [name] is a 64 y.o. male who presents with right heel decubitus ulcer and traumatic venous stasis ulcer right leg.  Patient states that he was weed whacking and a rock hit his leg sustaining an ulcer with venous insufficiency to the right leg.  Patient states he developed a blister which is turned into a necrotic ulcer on the right heel.  Past Medical History:  Diagnosis Date  . Diabetes ([place])   . 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 Corliss Parish.  . Hyperlipidemia   . Hyperparathyroidism ([place])   . Hypertension   . Kidney transplant recipient   . Obesity   . Sleep apnea    Past Surgical History:  Procedure Laterality Date  . AMPUTATION Right 07/19/2017   Procedure: AMPUTATION RIGHT 2ND TOE;  Surgeon: Leandrew Koyanagi, MD;  Location: [place];  Service: Orthopedics;  Laterality: Right;  . KIDNEY TRANSPLANT  2006  . NEPHRECTOMY TRANSPLANTED ORGAN     Social History   Socioeconomic History  . Marital status: Married    Spouse name: Not on file  . Number of children: Not on file  . Years of education: Not on file  . Highest education level: Not on file  Occupational History  . Occupation: trooper  Tobacco Use  . Smoking status: Never Smoker  . Smokeless tobacco: Former Systems developer    Types: Chew  Substance and Sexual Activity  . Alcohol use: No  . Drug use: No  . Sexual activity: Yes  Other Topics Concern  . Not on file  Social History Narrative  . Not on file   Social Determinants of Health   Financial Resource Strain:   . Difficulty of Paying Living Expenses:   Food Insecurity:   . Worried About Charity fundraiser in the Last Year:   . Arboriculturist in the Last Year:   Transportation Needs:   . Film/video editor (Medical):   Marland Kitchen Lack of Transportation  (Non-Medical):   Physical Activity:   . Days of Exercise per Week:   . Minutes of Exercise per Session:   Stress:   . Feeling of Stress :   Social Connections:   . Frequency of Communication with Friends and Family:   . Frequency of Social Gatherings with Friends and Family:   . Attends Religious Services:   . Active Member of Clubs or Organizations:   . Attends Archivist Meetings:   Marland Kitchen Marital Status:    History reviewed. No pertinent family history. - negative except otherwise stated in the family history section Allergies  Allergen Reactions  . Sulfa Antibiotics Rash   Prior to Admission medications   Medication Sig Start Date End Date Taking? Authorizing Provider  atorvastatin (LIPITOR) 10 MG tablet Take 10 mg by mouth daily.  07/07/17  Yes [provider]  cephALEXin (KEFLEX) 500 MG capsule Take 1 capsule (500 mg total) by mouth 4 (four) times daily. 12/19/19  Yes Yu, Amy V, PA-C  docusate sodium (COLACE) 100 MG capsule Take 1 capsule (100 mg total) by mouth 2 (two) times daily. 07/20/17  Yes Barton Dubois, MD  fenofibrate (TRICOR) 48 MG tablet Take 48 mg by mouth daily.   Yes [provider]  furosemide (LASIX) 40 MG tablet Take 40 mg by mouth daily as needed for  fluid (swelling).  07/06/17  Yes [provider]  glyBURIDE (DIABETA) 5 MG tablet Take 5 mg by mouth 2 (two) times daily with a meal.    Yes [provider]  HYDROcodone-acetaminophen (NORCO/VICODIN) 5-325 MG tablet Take 1-2 tablets by mouth every 6 (six) hours as needed for severe pain. 07/20/17  Yes Barton Dubois, MD  insulin aspart (NOVOLOG) 100 UNIT/ML injection Inject 0-35 Units into the skin 3 (three) times daily as needed for high blood sugar. Sliding scale    Yes [provider]  mupirocin ointment (BACTROBAN) 2 % Apply 1 application topically 2 (two) times daily. 12/19/19  Yes Yu, Amy V, PA-C  mycophenolate (MYFORTIC) 360 MG TBEC Take 360 mg by mouth 2 (two)  times daily.   Yes [provider]  omeprazole (PRILOSEC) 20 MG capsule Take 20 mg by mouth daily.  07/03/17  Yes [provider]  tacrolimus (PROGRAF) 1 MG capsule Take 1 mg by mouth 2 (two) times daily.    Yes [provider]  albuterol (PROVENTIL HFA;VENTOLIN HFA) 108 (90 BASE) MCG/ACT inhaler Inhale 2 puffs into the lungs every 6 (six) hours as needed for wheezing. 05/17/12 05/17/13  Shawnee Knapp, MD  methocarbamol (ROBAXIN) 500 MG tablet Take 1 tablet (500 mg total) by mouth every 8 (eight) hours as needed for muscle spasms. Patient not taking: Reported on 12/21/2019 07/20/17   Barton Dubois, MD  methocarbamol (ROBAXIN) 500 MG tablet Take 1 tablet (500 mg total) by mouth every 6 (six) hours as needed for muscle spasms. Patient not taking: Reported on 12/21/2019 08/10/17   Leandrew Koyanagi, MD   DG Chest 1 View  Result Date: 12/21/2019 CLINICAL DATA:  Right calcaneal wound for 3 weeks EXAM: CHEST  1 VIEW COMPARISON:  10/02/2013 FINDINGS: The heart size and mediastinal contours are within normal limits. Both lungs are clear. The visualized skeletal structures are unremarkable. IMPRESSION: No active disease. Electronically Signed   By: Randa Ngo M.D.   On: 12/21/2019 18:31   DG Foot 2 Views Right  Result Date: 12/21/2019 CLINICAL DATA:  Right calcaneal wound for 3 weeks, infection EXAM: RIGHT FOOT - 2 VIEW COMPARISON:  07/17/2017 FINDINGS: Frontal and lateral views of the right foot are obtained. 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 There are no acute displaced fractures. 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 Prominent enthesopathic changes of the calcaneus. There is soft tissue edema of the plantar and dorsal aspects of the hindfoot. I do not see any subcutaneous gas. No underlying bony destruction or periosteal reaction to suggest osteomyelitis. IMPRESSION: 1. No acute or destructive bony lesions. No radiographic evidence of  osteomyelitis. 2. Hindfoot soft tissue swelling. 3. Osteoarthritis. Electronically Signed   By: Randa Ngo M.D.   On: 12/21/2019 18:30   - pertinent xrays, CT, MRI studies were reviewed and independently interpreted  Positive ROS: All other systems have been reviewed and were otherwise negative with the exception of those mentioned in the HPI and as above.  Physical Exam: General: Alert, no acute distress Psychiatric: Patient is competent for consent with normal mood and affect Lymphatic: No axillary or cervical lymphadenopathy Cardiovascular: No pedal edema Respiratory: No cyanosis, no use of accessory musculature GI: No organomegaly, abdomen is soft and non-tender    Images:  @ENCIMAGES @  Labs:  Lab Results  Component Value Date   HGBA1C 9.4 (H) 12/21/2019   HGBA1C 9.3 (H) 07/17/2017   HGBA1C  07/11/2007    5.9 (NOTE)  Therapeutic target for the treatment of diabetes mellitus patients is <7% HbA1c.  American Diabetes Association Diabetes Care 2002;25:S33-S49.   ESRSEDRATE 40 (H) 07/17/2017   CRP 11.1 (H) 07/17/2017   REPTSTATUS PENDING 12/21/2019   CULT  12/21/2019    NO GROWTH 2 DAYS Performed at [facility] [address], [place], [place] [address]     Lab Results  Component Value Date   ALBUMIN 3.1 (L) 12/22/2019   ALBUMIN 3.0 (L) 07/18/2017   ALBUMIN 3.9 10/03/2013   PREALBUMIN 16.1 (L) 07/17/2017    Neurologic: Patient does not have protective sensation bilateral lower extremities.   MUSCULOSKELETAL:   Skin: Examination patient has a thin black eschar on the right heel 2 cm in diameter 1 mm deep.  Patient has massive venous insufficiency to the right leg with pitting edema brawny skin color changes weeping edema from the leg with a traumatic venous ulcer.  Patient has uncontrolled type 2 diabetes with hemoglobin A1c of 9.4.  I cannot palpate a pulse ankle-brachial indices are pending.  The MRI scan is reviewed there is no evidence of any  deep abscess or osteomyelitis the ulcer does appear superficial.  Assessment: Assessment: Uncontrolled type 2 diabetes with right heel superficial decubitus ulcer with traumatic venous stasis ulcer right leg.  Plan: ABI studies are pending, I will order a PRAFO boot to unload the heel ulcer and will order a 20-30 compression stocking for patient to wear around-the-clock for the venous insufficiency ulcer.  Anticipate patient can be discharged tomorrow if vascular studies are adequate  Thank you for the consult and the opportunity to see Mr. Talon Regala, [place] [phone] 12:55 PM

## 2019-12-23 NOTE — Plan of Care (Signed)

## 2019-12-24 ENCOUNTER — Encounter (HOSPITAL_COMMUNITY): Payer: Self-pay | Admitting: Internal Medicine

## 2019-12-24 DIAGNOSIS — L97919 Non-pressure chronic ulcer of unspecified part of right lower leg with unspecified severity: Secondary | ICD-10-CM | POA: Diagnosis present

## 2019-12-24 DIAGNOSIS — E1142 Type 2 diabetes mellitus with diabetic polyneuropathy: Secondary | ICD-10-CM | POA: Diagnosis present

## 2019-12-24 DIAGNOSIS — I872 Venous insufficiency (chronic) (peripheral): Secondary | ICD-10-CM | POA: Diagnosis present

## 2019-12-24 DIAGNOSIS — I87331 Chronic venous hypertension (idiopathic) with ulcer and inflammation of right lower extremity: Secondary | ICD-10-CM

## 2019-12-24 DIAGNOSIS — L03115 Cellulitis of right lower limb: Secondary | ICD-10-CM

## 2019-12-24 DIAGNOSIS — L97411 Non-pressure chronic ulcer of right heel and midfoot limited to breakdown of skin: Secondary | ICD-10-CM | POA: Diagnosis present

## 2019-12-24 LAB — CBC
HCT: 33.9 % — ABNORMAL LOW (ref 39.0–52.0)
Hemoglobin: 11 g/dL — ABNORMAL LOW (ref 13.0–17.0)
MCH: 27.6 pg (ref 26.0–34.0)
MCHC: 32.4 g/dL (ref 30.0–36.0)
MCV: 85 fL (ref 80.0–100.0)
Platelets: 114 10*3/uL — ABNORMAL LOW (ref 150–400)
RBC: 3.99 MIL/uL — ABNORMAL LOW (ref 4.22–5.81)
RDW: 14.9 % (ref 11.5–15.5)
WBC: 3.8 10*3/uL — ABNORMAL LOW (ref 4.0–10.5)
nRBC: 0 % (ref 0.0–0.2)

## 2019-12-24 LAB — BASIC METABOLIC PANEL
Anion gap: 9 (ref 5–15)
BUN: 40 mg/dL — ABNORMAL HIGH (ref 8–23)
CO2: 20 mmol/L — ABNORMAL LOW (ref 22–32)
Calcium: 8.6 mg/dL — ABNORMAL LOW (ref 8.9–10.3)
Chloride: 108 mmol/L (ref 98–111)
Creatinine, Ser: 1.81 mg/dL — ABNORMAL HIGH (ref 0.61–1.24)
GFR calc Af Amer: 45 mL/min — ABNORMAL LOW (ref >60)
GFR calc non Af Amer: 39 mL/min — ABNORMAL LOW (ref >60)
Glucose, Bld: 192 mg/dL — ABNORMAL HIGH (ref 70–99)
Potassium: 4.5 mmol/L (ref 3.5–5.1)
Sodium: 137 mmol/L (ref 135–145)

## 2019-12-24 LAB — GLUCOSE, CAPILLARY
Glucose-Capillary: 222 mg/dL — ABNORMAL HIGH (ref 70–99)
Glucose-Capillary: 208 mg/dL — ABNORMAL HIGH (ref 70–99)

## 2019-12-24 MED ORDER — DOXYCYCLINE HYCLATE 100 MG PO TABS: 100.0000 mg | ORAL_TABLET | Freq: Two times a day (BID) | ORAL | 0 refills | Status: AC

## 2019-12-24 MED ORDER — DOXYCYCLINE HYCLATE 100 MG PO TABS
100.0000 mg | ORAL_TABLET | Freq: Two times a day (BID) | ORAL | Status: DC
  Administered 2019-12-24: 100 mg via ORAL
  Filled 2019-12-24: qty 1

## 2019-12-24 MED ORDER — INSULIN GLARGINE 100 UNIT/ML ~~LOC~~ SOLN: 10.0000 [IU] | Freq: Every day | SUBCUTANEOUS | 0 refills | Status: DC

## 2019-12-24 MED ORDER — INSULIN PEN NEEDLE 32G X 4 MM MISC: 1 refills | Status: DC

## 2019-12-24 MED ORDER — INSULIN DETEMIR 100 UNIT/ML FLEXPEN
10.0000 [IU] | PEN_INJECTOR | Freq: Every day | SUBCUTANEOUS | 0 refills | Status: DC
Start: 2019-12-24 — End: 2019-12-24

## 2019-12-24 MED ORDER — INSULIN DETEMIR 100 UNIT/ML FLEXPEN
10.0000 [IU] | PEN_INJECTOR | Freq: Every day | SUBCUTANEOUS | 0 refills | Status: DC
Start: 2019-12-24 — End: 2020-01-30

## 2019-12-24 MED FILL — DOXYCYCLINE HYCLATE 100 MG: 100 | 7 days supply | Qty: 14 | Fill #0

## 2019-12-24 MED FILL — BD PEN NDL NANO 32GX5/32: 32G X 4 MM | 30 days supply | Qty: 100 | Fill #0

## 2019-12-24 MED FILL — LEVEMIR FLEXTOUCH 100 UNITS: 100 | 28 days supply | Qty: 3 | Fill #0

## 2019-12-24 NOTE — Plan of Care (Signed)

## 2019-12-24 NOTE — Progress Notes (Addendum)
Pt IV removed and AVS reviewed, all questions answered to pt satisfaction. Pt boot, ted hose, and meds delivered to room. Pt is dressed and belongings gathered, their ride has been called. wil continue to monitor.   1535- pt taken downstairs via wheelchair with family at bedside

## 2019-12-24 NOTE — Progress Notes (Addendum)
Pharmacy Antibiotic Note  Steven [name] is a 64 y.o. male admitted on 12/21/2019 with cellulitis.  Pharmacy has been consulted for Cefepime and Vancomycin dosing.  He is currently on day 4 vanc/cefepime. Scr ~1.8. Blood cultures remained neg. Will d/w MD about changing to PO doxy to avoid nephrotoxicity. We will get level if IV is still needed. Linezolid is not a good option due to low platelets.  Addendum:  We will go with doxycycline therapy after d/w Dyanne Carrel.     Height: 5\' 11"  (180.3 cm) Weight: 108.9 kg (240 lb) IBW/kg (Calculated) : 75.3  Temp (24hrs), Avg:98.2 F (36.8 C), Min:97.7 F (36.5 C), Max:98.8 F (37.1 C)  Recent Labs  Lab 12/21/19 1835 12/21/19 2331 12/22/19 0312 12/23/19 0208 12/24/19 0643  WBC 4.4 3.8* 3.9* 3.7* 3.8*  CREATININE 2.14* 2.00* 1.97* 1.93* 1.81*  LATICACIDVEN 2.9* 2.0*  --   --   --     Estimated Creatinine Clearance: 53.1 mL/min (A) (by C-G formula based on SCr of 1.81 mg/dL (H)).    Allergies  Allergen Reactions  . Sulfa Antibiotics Rash    Antimicrobials this admission: 5/22 Cefepime >> 5/25 5/22 Vancomycin >> 5/25  Dose adjustments this admission: N/a  Microbiology results: 5/22 blood>>ngtd  Plan:  Dc vanc/cefepime Doxycycline 100mg  PO BID   Onnie Boer, PharmD, BCIDP, AAHIVP, CPP Infectious Disease Pharmacist 12/24/2019 9:24 AM

## 2019-12-24 NOTE — Discharge Summary (Addendum)
Physician Discharge Summary  Steven [name] KDT:[id number] DOB: 19-Feb-1956 DOA: 12/21/2019  PCP: Corliss Parish, MD  Admit date: 12/21/2019 Discharge date: 12/24/2019  Admitted From: home Discharge disposition: home   Recommendations for Outpatient Follow-Up:    Follow up with Dr Sharol Given this week. Follow-up with primary care provider in 1 to 2 weeks for evaluation of diabetes control Wound care as instructed   Discharge Diagnosis:   Principal Problem:   Chronic venous hypertension (idiopathic) with ulcer and inflammation of right lower extremity (HCC) Active Problems:   Diabetes mellitus ([place])   Hyperlipidemia   Essential hypertension   Kidney transplant recipient   Obesity, Class III, BMI 40-49.9 (morbid obesity) (HCC)   Non-pressure chronic ulcer of right heel and midfoot limited to breakdown of skin ([place])   Diabetic polyneuropathy associated with type 2 diabetes mellitus ([place])   Venous insufficiency    Discharge Condition: Improved.  Diet recommendation: Low sodium, heart healthy.  Carbohydrate-modified.    Wound care: [place] Kitchen  Apply iodine twice daily and place a small foam dressing over the area.  Follow-up with Dr. Sharol Given this week  Code status: Full.   History of Present Illness:   Steven [name] is a 64 y.o. male with medical history significant of previous osteomyelitis, kidney transplant, hypertension, morbid obesity, hyperparathyroidism, hyperlipidemia, diabetes who was seen in the urgent care center 2 days prior to presentation on May 22 with open wound of the right heel.  Was treated with antibiotics.  He came back to the ER on May 22 with worsening symptoms.  Symptoms started about 3 weeks prior when he wore some tight fitting shoes that he was not used to.  A blister develop in the right heel.  This continually worsened.  He was seen by his PCP and given Keflex couple of days prior.  This did not resolve the problem.  He denied fever or chills but leg  was red and weak.  He  had previous osteomyelitis on the left side and was worried.  Patient was seen in the ER and appeared to have rapidly expanding cellulitis.  Due to his significant risk factors including diabetes and immunocompromise status from kidney transplant and suppressions patient was admitted to the hospital for aggressive IV antibiotic treatment.   Hospital Course by Problem:   #1.  Chronic venous hypertension with ulcer and inflammation of the right lower extremity/ Cellulitis of the right foot.  X-ray without osteomyelitis.  MRI no evidence of osteomyelitis or abscess, prominent nonenhancing soft tissue swelling of the lower leg and foot. Cefepime and vancomycin initiated.  Evaluated by orthopedic surgery who opined patient with massive venous insufficiency to the right leg and pitting edema, brawny skin color changes and traumatic venous ulcer.  Recommended PRAFO boot to unload the heel ulcer and compression stockings to be worn around-the-clock for venous insufficiency.  Also recommended improved diabetes control.  Follow-up with Dr. Due to this week.  Of note ABIs not done as patient has noncompressible vessels from study 2018. He remained afebrile hemodynamically stable and nontoxic-appearing.  Will discharge with 7 more days of doxy to complete a 10-day course.   #2.  Diabetes type 2.  Uncontrolled.  Hemoglobin A1c is greater than 9.  Will discharge on home regimen and recommend he follow-up with his PCP in 1 week for evaluation of diabetes control   #3.  Hypertension.  Fair control.  Home medications include Lasix.   #4.  History of kidney transplant with  chronic kidney disease stage IIIa.  Creatinine 1.8 at discharge. Continue home meds   #5.  Obstructive sleep apnea.  Uses CPAP at night    Medical Consultants:   Dr. Sharol Given orthopedics   Discharge Exam:   Vitals:   12/24/19 0755 12/24/19 1451  BP: 112/63 139/67  Pulse: 66 67  Resp: 17 18  Temp: 98 F (36.7 C) 98.2  F (36.8 C)  SpO2: 100% 100%   Vitals:   12/24/19 0013 12/24/19 0446 12/24/19 0755 12/24/19 1451  BP: (!) 149/76 136/63 112/63 139/67  Pulse: 65 60 66 67  Resp: 15 15 17 18   Temp: 98.2 F (36.8 C) 97.7 F (36.5 C) 98 F (36.7 C) 98.2 F (36.8 C)  TempSrc: Oral Oral Oral Oral  SpO2: 99% 99% 100% 100%  Weight:      Height:        General exam: Appears calm and comfortable.  Sitting up in chair no acute distress Respiratory system: Clear to auscultation. Respiratory effort normal. Cardiovascular system: S1 & S2 heard, RRR. No JVD,  rubs, gallops or clicks. + murmurs.  Trace to 1+ pitting edema bilaterally Gastrointestinal system: Abdomen is nondistended, soft and nontender. No organomegaly or masses felt. Normal bowel sounds heard. Central nervous system: Alert and oriented. No focal neurological deficits. Extremities: No clubbing,  or cyanosis. No edema.  Right foot warm to touch dressing dry and intact lower extremities with venous stasis changes Skin: No rashes, lesions or ulcers. Psychiatry: Judgement and insight appear normal. Mood & affect appropriate.    The results of significant diagnostics from this hospitalization (including imaging, microbiology, ancillary and laboratory) are listed below for reference.     Procedures and Diagnostic Studies:   DG Chest 1 View  Result Date: 12/21/2019 CLINICAL DATA:  Right calcaneal wound for 3 weeks EXAM: CHEST  1 VIEW COMPARISON:  10/02/2013 FINDINGS: The heart size and mediastinal contours are within normal limits. Both lungs are clear. The visualized skeletal structures are unremarkable. IMPRESSION: No active disease. Electronically Signed   By: Randa Ngo M.D.   On: 12/21/2019 18:31   DG Foot 2 Views Right  Result Date: 12/21/2019 CLINICAL DATA:  Right calcaneal wound for 3 weeks, infection EXAM: RIGHT FOOT - 2 VIEW COMPARISON:  07/17/2017 FINDINGS: Frontal and lateral views of the right foot are obtained. 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 There are no acute displaced fractures. 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 Prominent enthesopathic changes of the calcaneus. There is soft tissue edema of the plantar and dorsal aspects of the hindfoot. I do not see any subcutaneous gas. No underlying bony destruction or periosteal reaction to suggest osteomyelitis. IMPRESSION: 1. No acute or destructive bony lesions. No radiographic evidence of osteomyelitis. 2. Hindfoot soft tissue swelling. 3. Osteoarthritis. Electronically Signed   By: Randa Ngo M.D.   On: 12/21/2019 18:30     Labs:   Basic Metabolic Panel: Recent Labs  Lab 12/21/19 1835 12/21/19 1835 12/21/19 2331 12/22/19 0312 12/22/19 0312 12/23/19 0208 12/24/19 0643  NA 140  --   --  136  --  137 137  K 4.0   < >  --  4.8   < > 5.1 4.5  CL 103  --   --  107  --  105 108  CO2 23  --   --  20*  --  21* 20*  GLUCOSE 85  --   --  281*  --  292* 192*  BUN 54*  --   --  47*  --  41* 40*  CREATININE 2.14*  --  2.00* 1.97*  --  1.93* 1.81*  CALCIUM 9.4  --   --  8.3*  --  8.5* 8.6*   < > = values in this interval not displayed.   GFR Estimated Creatinine Clearance: 53.1 mL/min (A) (by C-G formula based on SCr of 1.81 mg/dL (H)). Liver Function Tests: Recent Labs  Lab 12/22/19 0312  AST 14*  ALT 18  ALKPHOS 85  BILITOT 1.1  PROT 5.6*  ALBUMIN 3.1*   No results for input(s): LIPASE, AMYLASE in the last 168 hours. No results for input(s): AMMONIA in the last 168 hours. Coagulation profile No results for input(s): INR, PROTIME in the last 168 hours.  CBC: Recent Labs  Lab 12/21/19 1835 12/21/19 2331 12/22/19 0312 12/23/19 0208 12/24/19 0643  WBC 4.4 3.8* 3.9* 3.7* 3.8*  HGB 12.6* 11.6* 10.9* 10.8* 11.0*  HCT 38.4* 35.5* 33.6* 33.2* 33.9*  MCV 83.7 83.1 83.6 84.1 85.0  PLT 149* 124* 121* 122* 114*   Cardiac Enzymes: No results for input(s): CKTOTAL, CKMB, CKMBINDEX, TROPONINI in the last 168  hours. BNP: Invalid input(s): POCBNP CBG: Recent Labs  Lab 12/23/19 1138 12/23/19 1636 12/23/19 2032 12/24/19 0752 12/24/19 1119  GLUCAP 251* 168* 230* 222* 208*   D-Dimer No results for input(s): DDIMER in the last 72 hours. Hgb A1c Recent Labs    12/21/19 2331  HGBA1C 9.4*   Lipid Profile No results for input(s): CHOL, HDL, LDLCALC, TRIG, CHOLHDL, LDLDIRECT in the last 72 hours. Thyroid function studies No results for input(s): TSH, T4TOTAL, T3FREE, THYROIDAB in the last 72 hours.  Invalid input(s): FREET3 Anemia work up No results for input(s): VITAMINB12, FOLATE, FERRITIN, TIBC, IRON, RETICCTPCT in the last 72 hours. Microbiology Recent Results (from the past 240 hour(s))  Blood culture (routine x 2)     Status: None (Preliminary result)   Collection Time: 12/21/19  6:35 PM   Specimen: BLOOD  Result Value Ref Range Status   Specimen Description BLOOD SITE NOT SPECIFIED  Final   Special Requests   Final    BOTTLES DRAWN AEROBIC AND ANAEROBIC Blood Culture results may not be optimal due to an excessive volume of blood received in culture bottles   Culture   Final    NO GROWTH 3 DAYS Performed at [facility], [place] [address] [place], [place] [address]    Report Status PENDING  Incomplete  Blood culture (routine x 2)     Status: None (Preliminary result)   Collection Time: 12/21/19  6:39 PM   Specimen: BLOOD  Result Value Ref Range Status   Specimen Description BLOOD RIGHT ANTECUBITAL  Final   Special Requests   Final    BOTTLES DRAWN AEROBIC AND ANAEROBIC Blood Culture results may not be optimal due to an excessive volume of blood received in culture bottles   Culture   Final    NO GROWTH 3 DAYS Performed at [facility], [place] [address], [place], [place] [address]    Report Status PENDING  Incomplete  SARS Coronavirus 2 by RT PCR (hospital order, performed in [facility] hospital lab) Nasopharyngeal Nasopharyngeal Swab     Status: None    Collection Time: 12/21/19  7:46 PM   Specimen: Nasopharyngeal Swab  Result Value Ref Range Status   SARS Coronavirus 2 NEGATIVE NEGATIVE Final    Comment: (NOTE) SARS-CoV-2 target nucleic acids are NOT  DETECTED. The SARS-CoV-2 RNA is generally detectable in upper and lower respiratory specimens during the acute phase of infection. The lowest concentration of SARS-CoV-2 viral copies this assay can detect is 250 copies / mL. A negative result does not preclude SARS-CoV-2 infection and should not be used as the sole basis for treatment or other patient management decisions.  A negative result may occur with improper specimen collection / handling, submission of specimen other than nasopharyngeal swab, presence of viral mutation(s) within the areas targeted by this assay, and inadequate number of viral copies (<250 copies / mL). A negative result must be combined with clinical observations, patient history, and epidemiological information. Fact Sheet for Patients:   StrictlyIdeas.no Fact Sheet for Healthcare Providers: BankingDealers.co.za This test is not yet approved or cleared  by the [place] FDA and has been authorized for detection and/or diagnosis of SARS-CoV-2 by FDA under an Emergency Use Authorization (EUA).  This EUA will remain in effect (meaning this test can be used) for the duration of the COVID-19 declaration under Section 564(b)(1) of the Act, 21 U.S.C. section 360bbb-3(b)(1), unless the authorization is terminated or revoked sooner. Performed at [facility], [place] [address], [place],  [address]      Discharge Instructions:   Discharge Instructions     Apply other prosthetic/orthotic device   Complete by: As directed    Wear the [facility] boot and compression sock around-the-clock may remove the boot to change the sock as needed.   Call MD for:  severe uncontrolled pain   Complete by: As directed     Call MD for:  temperature >100.4   Complete by: As directed    Diet - low sodium heart healthy   Complete by: As directed    Discharge instructions   Complete by: As directed    Follow up with Dr. Sharol Given this week.  Take medications as prescribed Keep leg elevated Wear boot and stockings as instructed   Increase activity slowly   Complete by: As directed       Allergies as of 12/24/2019       Reactions   Sulfa Antibiotics Rash        Medication List     STOP taking these medications    cephALEXin 500 MG capsule Commonly known as: KEFLEX   methocarbamol 500 MG tablet Commonly known as: ROBAXIN       TAKE these medications    albuterol 108 (90 Base) MCG/ACT inhaler Commonly known as: VENTOLIN HFA Inhale 2 puffs into the lungs every 6 (six) hours as needed for wheezing.   atorvastatin 10 MG tablet Commonly known as: LIPITOR Take 10 mg by mouth daily.   docusate sodium 100 MG capsule Commonly known as: COLACE Take 1 capsule (100 mg total) by mouth 2 (two) times daily.   doxycycline 100 MG tablet Commonly known as: VIBRA-TABS Take 1 tablet (100 mg total) by mouth every 12 (twelve) hours for 7 days.   fenofibrate 48 MG tablet Commonly known as: TRICOR Take 48 mg by mouth daily.   furosemide 40 MG tablet Commonly known as: LASIX Take 40 mg by mouth daily as needed for fluid (swelling).   glyBURIDE 5 MG tablet Commonly known as: DIABETA Take 5 mg by mouth 2 (two) times daily with a meal.   HYDROcodone-acetaminophen 5-325 MG tablet Commonly known as: NORCO/VICODIN Take 1-2 tablets by mouth every 6 (six) hours as needed for severe pain.   insulin aspart 100 UNIT/ML injection Commonly known  as: novoLOG Inject 0-35 Units into the skin 3 (three) times daily as needed for high blood sugar. Sliding scale   insulin detemir 100 UNIT/ML FlexPen Commonly known as: LEVEMIR Inject 10 Units into the skin daily.   Insulin Pen Needle 32G X 4 MM Misc Use as  directed with insulin pen   mupirocin ointment 2 % Commonly known as: Bactroban Apply 1 application topically 2 (two) times daily.   mycophenolate 360 MG Tbec EC tablet Commonly known as: MYFORTIC Take 360 mg by mouth 2 (two) times daily.   omeprazole 20 MG capsule Commonly known as: PRILOSEC Take 20 mg by mouth daily.   tacrolimus 1 MG capsule Commonly known as: PROGRAF Take 1 mg by mouth 2 (two) times daily.       Follow-up Information     Newt Minion, MD Follow up.   Specialty: Orthopedic Surgery Why: Follow-up in the office this week Contact information: [place] [address] [phone]         Corliss Parish, MD Follow up in 1 week(s).   Specialty: Nephrology Contact information: [place] [address] [phone]         Isaac Bliss, Rayford Halsted, MD Follow up.   Specialty: Internal Medicine Why: contact office to see if she is taking new patients Contact information: [place] [address] [phone]         Emeterio Reeve, DO Follow up.   Specialty: Osteopathic Medicine Why: she is also a great PCP- in [place] but she may have some availabilty  Contact information: [address] [place]  [address] [phone]             Time coordinating discharge: 40 minutes  Signed:  Dyanne Carrel [facility] 12/24/2019, 3:35 PM

## 2019-12-24 NOTE — Discharge Instructions (Signed)
PRAFO boot to unload the heel ulcer  Apply iodine twice daily and place a small foam dressing over the area. - 20-30 compression stocking for patient to wear around-the-clock for the venous insufficiency ulcer -keep legs elevated when sitting Need better blood sugar control

## 2019-12-24 NOTE — Progress Notes (Signed)
Orthopedic Tech Progress Note Patient Details:  CHUONG CASEBEER 08/20/1955 [id number] Called in orders to [facility] for a PRAFO BOOT and a GRADUATED COMPRESSION STOCKINGS  Patient ID: BEATRICE SEHGAL, male   DOB: 1956/04/15, 63 y.o.   MRN: [id number]   Janit Pagan 12/24/2019, 8:31 AM

## 2019-12-26 LAB — CULTURE, BLOOD (ROUTINE X 2)
Culture: NO GROWTH
Culture: NO GROWTH

## 2020-01-06 ENCOUNTER — Ambulatory Visit: Payer: BC Managed Care – PPO | Admitting: Orthopedic Surgery

## 2020-01-06 ENCOUNTER — Encounter: Payer: Self-pay | Admitting: Orthopedic Surgery

## 2020-01-06 ENCOUNTER — Other Ambulatory Visit: Payer: Self-pay

## 2020-01-06 VITALS — Ht 71.0 in | Wt 240.0 lb

## 2020-01-06 DIAGNOSIS — L97411 Non-pressure chronic ulcer of right heel and midfoot limited to breakdown of skin: Secondary | ICD-10-CM | POA: Diagnosis not present

## 2020-01-06 NOTE — Progress Notes (Signed)
Office Visit Note   Patient: Steven [name]           Date of Birth: 06/04/1956           MRN: [id number] Visit Date: 01/06/2020              Requested by: Corliss Parish, MD [address] [place],  [place] [address] PCP: Corliss Parish, MD  Chief Complaint  Patient presents with  . Right Foot - Follow-up      HPI: Patient is a 64 year old gentleman who presents in follow-up for right heel decubitus ulcer he has been given instructions wear compression stockings he is not wearing them today he does have a PRAFO he states he wears this at night he states he has been using Betadine on the wound.  He states his insulin has been adjusted and his glucose is under better control.  Assessment & Plan: Visit Diagnoses:  1. Non-pressure chronic ulcer of right heel and midfoot limited to breakdown of skin ([place])     Plan: Patient will wear the compression socks daily not use Betadine use with topical antibiotic ointment and stay out of work for at least 1 month.  Wear the PRAFO at night.  Follow-Up Instructions: Return in about 3 weeks (around 01/27/2020).   Ortho Exam  Patient is alert, oriented, no adenopathy, well-dressed, normal affect, normal respiratory effort. Examination patient's ulcer is healing well the callus skin from around the edges was removed and there is good healthy bleeding granulation tissue.  Iodosorb and a Band-Aid was applied.  Patient has a palpable dorsalis pedis pulse.  The wound is 2 cm in diameter after debridement.  Imaging: No results found. No images are attached to the encounter.  Labs: Lab Results  Component Value Date   HGBA1C 9.4 (H) 12/21/2019   HGBA1C 9.3 (H) 07/17/2017   HGBA1C  07/11/2007    5.9 (NOTE)  Therapeutic target for the treatment of diabetes mellitus patients is <7% HbA1c.  American Diabetes Association Diabetes Care 2002;25:S33-S49.   ESRSEDRATE 40 (H) 07/17/2017   CRP 11.1 (H) 07/17/2017   REPTSTATUS 12/26/2019 FINAL  12/21/2019   CULT  12/21/2019    NO GROWTH 5 DAYS Performed at [facility], [place] [address], [place], [place] [address]      Lab Results  Component Value Date   ALBUMIN 3.1 (L) 12/22/2019   ALBUMIN 3.0 (L) 07/18/2017   ALBUMIN 3.9 10/03/2013   PREALBUMIN 16.1 (L) 07/17/2017    Lab Results  Component Value Date   MG 1.5 (L) 07/18/2017   MG 1.5 07/12/2007   No results found for: [facility]  Lab Results  Component Value Date   PREALBUMIN 16.1 (L) 07/17/2017   CBC EXTENDED Latest Ref Rng & Units 12/24/2019 12/23/2019 12/22/2019  WBC 4.0 - 10.5 K/uL 3.8(L) 3.7(L) 3.9(L)  RBC 4.22 - 5.81 MIL/uL 3.99(L) 3.95(L) 4.02(L)  HGB 13.0 - 17.0 g/dL 11.0(L) 10.8(L) 10.9(L)  HCT 39.0 - 52.0 % 33.9(L) 33.2(L) 33.6(L)  PLT 150 - 400 K/uL 114(L) 122(L) 121(L)  NEUTROABS 1.7 - 7.7 K/uL - - -  LYMPHSABS 0.7 - 4.0 K/uL - - -     Body mass index is 33.47 kg/m.  Orders:  No orders of the defined types were placed in this encounter.  No orders of the defined types were placed in this encounter.    Procedures: No procedures performed  Clinical Data: No additional findings.  ROS:  All other systems negative, except as noted in  the HPI. Review of Systems  Objective: Vital Signs: Ht 5\' 11"  (1.803 m)   Wt 240 lb (108.9 kg)   BMI 33.47 kg/m   Specialty Comments:  No specialty comments available.  PMFS History: Patient Active Problem List   Diagnosis Date Noted  . Chronic venous hypertension (idiopathic) with ulcer and inflammation of right lower extremity ([place])   . Non-pressure chronic ulcer of right heel and midfoot limited to breakdown of skin ([place])   . Diabetic polyneuropathy associated with type 2 diabetes mellitus ([place])   . Venous insufficiency   . Cellulitis of right foot 12/21/2019  . Obesity, Class III, BMI 40-49.9 (morbid obesity) ([place])   . Osteomyelitis ([place]) 07/17/2017  . Acute on chronic kidney failure ([place]) 07/17/2017  . Kidney transplant recipient 05/24/2015   . Diabetes mellitus ([place]) 05/10/2012  . Hyperlipidemia 05/10/2012  . Essential hypertension 05/10/2012   Past Medical History:  Diagnosis Date  . Diabetes ([place])   . 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 Corliss Parish.  . Hyperlipidemia   . Hyperparathyroidism ([place])   . Hypertension   . Kidney transplant recipient   . Obesity   . Sleep apnea   . Venous insufficiency     History reviewed. No pertinent family history.  Past Surgical History:  Procedure Laterality Date  . AMPUTATION Right 07/19/2017   Procedure: AMPUTATION RIGHT 2ND TOE;  Surgeon: Leandrew Koyanagi, MD;  Location: [facility];  Service: Orthopedics;  Laterality: Right;  . KIDNEY TRANSPLANT  2006  . NEPHRECTOMY TRANSPLANTED ORGAN     Social History   Occupational History  . Occupation: trooper  Tobacco Use  . Smoking status: Never Smoker  . Smokeless tobacco: Former Systems developer    Types: Chew  Substance and Sexual Activity  . Alcohol use: No  . Drug use: No  . Sexual activity: Yes

## 2020-01-27 ENCOUNTER — Ambulatory Visit: Payer: BC Managed Care – PPO | Admitting: Orthopedic Surgery

## 2020-01-29 NOTE — Patient Instructions (Signed)
Thank you for choosing Primary Care at [facility] to be your medical home!    Steven [name] was seen by Steven Schools, DO today.   Steven [name]'s primary care provider is Steven Myron, DO.   For the best care possible, you should try to see Steven Myron, DO whenever you come to the clinic.   We look forward to seeing you again soon!  If you have any questions about your visit today, please call us at [phone] or feel free to reach your primary care provider via [place].

## 2020-01-30 ENCOUNTER — Telehealth (INDEPENDENT_AMBULATORY_CARE_PROVIDER_SITE_OTHER): Payer: BC Managed Care – PPO | Admitting: Internal Medicine

## 2020-01-30 ENCOUNTER — Other Ambulatory Visit: Payer: Self-pay

## 2020-01-30 DIAGNOSIS — N183 Chronic kidney disease, stage 3 unspecified: Secondary | ICD-10-CM

## 2020-01-30 DIAGNOSIS — Z794 Long term (current) use of insulin: Secondary | ICD-10-CM

## 2020-01-30 DIAGNOSIS — Z7689 Persons encountering health services in other specified circumstances: Secondary | ICD-10-CM

## 2020-01-30 DIAGNOSIS — Z940 Kidney transplant status: Secondary | ICD-10-CM | POA: Diagnosis not present

## 2020-01-30 DIAGNOSIS — E1122 Type 2 diabetes mellitus with diabetic chronic kidney disease: Secondary | ICD-10-CM

## 2020-01-30 DIAGNOSIS — E782 Mixed hyperlipidemia: Secondary | ICD-10-CM

## 2020-01-30 DIAGNOSIS — Z1159 Encounter for screening for other viral diseases: Secondary | ICD-10-CM

## 2020-01-30 DIAGNOSIS — Z1211 Encounter for screening for malignant neoplasm of colon: Secondary | ICD-10-CM

## 2020-01-30 MED ORDER — OMEPRAZOLE 20 MG PO CPDR: 20.0000 mg | DELAYED_RELEASE_CAPSULE | Freq: Every day | ORAL | 3 refills | Status: DC

## 2020-01-30 MED ORDER — INSULIN DETEMIR 100 UNIT/ML FLEXPEN: 10.0000 [IU] | PEN_INJECTOR | Freq: Every day | SUBCUTANEOUS | 1 refills | Status: DC

## 2020-01-30 NOTE — Progress Notes (Signed)
Virtual Visit via Telephone Note  I connected with Steven [name], on 01/30/2020 at 3:11 PM by telephone due to the COVID-19 pandemic and verified that I am speaking with the correct person using two identifiers.   Consent: I discussed the limitations, risks, security and privacy concerns of performing an evaluation and management service by telephone and the availability of in person appointments. I also discussed with the patient that there may be a patient responsible charge related to this service. The patient expressed understanding and agreed to proceed.   Location of Patient: Home   Location of Provider: Clinic    Persons participating in Telemedicine visit: Steven [name] [facility] Dr. Juleen China    History of Present Illness: Patient has a visit to establish care.   Patient has a PMH of DM. Last A1c 9.4% on 5/22. Says his DM has been managed by his nephrologist who has been essentially functioning as his primary care doctor. He is on Glyburide 5 mg BID. At recent hospitalization for diabetic infection he was started on Levemir 10u. States this is leveling out his CBG level, fasting was 105 this AM. Continues to be on Novolog sliding scale TID.   Also has a h/o kidney transplant 15 years ago due to damage from chronic NSAID use.    Past Medical History:  Diagnosis Date  . Diabetes ([place])   . 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 Corliss Parish.  . Hyperlipidemia   . Hyperparathyroidism ([place])   . Hypertension   . Kidney transplant recipient   . Obesity   . Sleep apnea   . Venous insufficiency    Allergies  Allergen Reactions  . Sulfa Antibiotics Rash    Current Outpatient Medications on File Prior to Visit  Medication Sig Dispense Refill  . atorvastatin (LIPITOR) 10 MG tablet Take 10 mg by mouth daily.     . fenofibrate (TRICOR) 48 MG tablet Take 48 mg by mouth daily.    . furosemide (LASIX) 40 MG tablet Take  40 mg by mouth daily as needed for fluid (swelling).   1  . glyBURIDE (DIABETA) 5 MG tablet Take 5 mg by mouth 2 (two) times daily with a meal.     . HYDROcodone-acetaminophen (NORCO/VICODIN) 5-325 MG tablet Take 1-2 tablets by mouth every 6 (six) hours as needed for severe pain. 30 tablet 0  . insulin aspart (NOVOLOG) 100 UNIT/ML injection Inject 0-35 Units into the skin 3 (three) times daily as needed for high blood sugar. Sliding scale     . insulin detemir (LEVEMIR) 100 UNIT/ML FlexPen Inject 10 Units into the skin daily. 15 mL 0  . Insulin Pen Needle 32G X 4 MM MISC Use as directed with insulin pen 100 each 1  . mycophenolate (MYFORTIC) 360 MG TBEC Take 360 mg by mouth 2 (two) times daily.    Marland Kitchen omeprazole (PRILOSEC) 20 MG capsule Take 20 mg by mouth daily.   3  . ONETOUCH VERIO test strip 1 each by Other route 3 (three) times daily.    . tacrolimus (PROGRAF) 1 MG capsule Take 1 mg by mouth 2 (two) times daily.      No current facility-administered medications on file prior to visit.    Observations/Objective: NAD. Speaking clearly.  Work of breathing normal.  Alert and oriented. Mood appropriate.   Assessment and Plan: 1. Encounter to establish care Reviewed patient's PMH, social history, surgical history, and medications.   2. Type 2  diabetes mellitus with stage 3 chronic kidney disease, with long-term current use of insulin, unspecified whether stage 3a or 3b CKD ([place]) Continue Levemir at current dose with Novolog SSI as seems to have achieved more optimal glucose control. Last A1c showed uncontrolled DM. Will need to repeat in about 2 months to determine control and need for adjustment of regimen.  Counseled on Diabetic diet, my plate method, 309 minutes of moderate intensity exercise/week Blood sugar logs with fasting goals of 80-120 mg/dl, random of less than 180 and in the event of sugars less than 60 mg/dl or greater than 400 mg/dl encouraged to notify the clinic. Advised on  the need for annual eye exams, annual foot exams, Pneumonia vaccine. - Lipid Panel; Future - Microalbumin/Creatinine Ratio, Urine; Future - insulin detemir (LEVEMIR) 100 UNIT/ML FlexPen; Inject 10 Units into the skin daily.  Dispense: 15 mL; Refill: 1  3. Mixed hyperlipidemia Takes Lipitor 10 mg. No prior lipid panel for review. Obtain to determine if patient would benefit for higher intensity statin therapy.  - Lipid Panel; Future  4. Kidney transplant recipient On Tacrolimus and Myfortic.   5. Need for hepatitis C screening test - Hepatitis C Antibody; Future  6. Colon cancer screening - Ambulatory referral to Gastroenterology   Follow Up Instructions: Lab visit    I discussed the assessment and treatment plan with the patient. The patient was provided an opportunity to ask questions and all were answered. The patient agreed with the plan and demonstrated an understanding of the instructions.   The patient was advised to call back or seek an in-person evaluation if the symptoms worsen or if the condition fails to improve as anticipated.     I provided 28 minutes total of non-face-to-face time during this encounter including median intraservice time, reviewing previous notes, investigations, ordering medications, medical decision making, coordinating care and patient verbalized understanding at the end of the visit.    Phill Myron, D.O. Primary Care at [facility]  01/30/2020, 3:11 PM

## 2020-02-07 ENCOUNTER — Encounter: Payer: Self-pay | Admitting: Gastroenterology

## 2020-02-21 ENCOUNTER — Other Ambulatory Visit: Payer: Self-pay

## 2020-02-21 ENCOUNTER — Ambulatory Visit (INDEPENDENT_AMBULATORY_CARE_PROVIDER_SITE_OTHER): Payer: BC Managed Care – PPO | Admitting: Internal Medicine

## 2020-02-21 ENCOUNTER — Other Ambulatory Visit: Payer: Self-pay | Admitting: Internal Medicine

## 2020-02-21 ENCOUNTER — Encounter: Payer: Self-pay | Admitting: Internal Medicine

## 2020-02-21 VITALS — BP 114/80 | HR 77 | Temp 98.5°F | Ht 71.5 in | Wt 243.8 lb

## 2020-02-21 DIAGNOSIS — I10 Essential (primary) hypertension: Secondary | ICD-10-CM

## 2020-02-21 DIAGNOSIS — Z940 Kidney transplant status: Secondary | ICD-10-CM | POA: Diagnosis not present

## 2020-02-21 DIAGNOSIS — E785 Hyperlipidemia, unspecified: Secondary | ICD-10-CM

## 2020-02-21 DIAGNOSIS — R5383 Other fatigue: Secondary | ICD-10-CM

## 2020-02-21 DIAGNOSIS — E1122 Type 2 diabetes mellitus with diabetic chronic kidney disease: Secondary | ICD-10-CM

## 2020-02-21 DIAGNOSIS — Z794 Long term (current) use of insulin: Secondary | ICD-10-CM

## 2020-02-21 DIAGNOSIS — N183 Chronic kidney disease, stage 3 unspecified: Secondary | ICD-10-CM

## 2020-02-21 DIAGNOSIS — E1142 Type 2 diabetes mellitus with diabetic polyneuropathy: Secondary | ICD-10-CM

## 2020-02-21 MED ORDER — OXYCODONE HCL 5 MG PO TABS: 5.0000 mg | ORAL_TABLET | ORAL | 0 refills | Status: DC | PRN

## 2020-02-21 NOTE — Patient Instructions (Signed)
-  Nice seeing you today!!  -Lab work today; will notify you once results are available.  -Check your blood sugar before breakfast, lunch and dinner and bring that chart in to your next visit.  -Schedule follow up in 3 months.

## 2020-02-21 NOTE — Progress Notes (Signed)
New Patient Office Visit     This visit occurred during the SARS-CoV-2 public health emergency.  Safety protocols were in place, including screening questions prior to the visit, additional usage of staff PPE, and extensive cleaning of exam room while observing appropriate contact time as indicated for disinfecting solutions.    CC/Reason for Visit: Establish care, discuss chronic medical conditions Previous PCP: His nephrologist Dr. Corliss Parish has been functioning as his PCP Last Visit: May 2020  HPI: Steven [name] is a 64 y.o. male who is coming in today for the above mentioned reasons.  He has a complex medical history significant for renal transplant in 2006, he takes Prograf and mycophenolate for this and follows with nephrology routinely.  He has a history of hypertension, insulin-dependent diabetes and hyperlipidemia.  His diabetes has not been well controlled.  His most recent A1c was 9.4 in May.  He was hospitalized in May for a right lower extremity ulcer that has since healed completely.  He has been complaining of excessive fatigue and lack of energy.  He takes sporadic oxycodone for minor aches and pains.  He is requesting a refill today.  He states that he maybe takes 1 tablet every 2 to 3 weeks.   Past Medical/Surgical History: Past Medical History:  Diagnosis Date  . Diabetes ([place])   . 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 Corliss Parish.  . Hyperlipidemia   . Hyperparathyroidism ([place])   . Hypertension   . Kidney transplant recipient   . Obesity   . Sleep apnea   . Venous insufficiency     Past Surgical History:  Procedure Laterality Date  . AMPUTATION Right 07/19/2017   Procedure: AMPUTATION RIGHT 2ND TOE;  Surgeon: Leandrew Koyanagi, MD;  Location: [place];  Service: Orthopedics;  Laterality: Right;  . KIDNEY TRANSPLANT  2006  . NEPHRECTOMY TRANSPLANTED ORGAN      Social History:  reports that he has  never smoked. He has quit using smokeless tobacco.  His smokeless tobacco use included chew. He reports that he does not drink alcohol and does not use drugs.  Allergies: Allergies  Allergen Reactions  . Sulfa Antibiotics Rash    Family History:  No heart disease, cancer, stroke that he is aware of    Current Outpatient Medications:  .  atorvastatin (LIPITOR) 10 MG tablet, Take 10 mg by mouth daily. , Disp: , Rfl:  .  fenofibrate (TRICOR) 48 MG tablet, Take 48 mg by mouth daily., Disp: , Rfl:  .  furosemide (LASIX) 40 MG tablet, Take 40 mg by mouth daily as needed for fluid (swelling). , Disp: , Rfl: 1 .  glyBURIDE (DIABETA) 5 MG tablet, Take 5 mg by mouth 2 (two) times daily with a meal. , Disp: , Rfl:  .  insulin aspart (NOVOLOG) 100 UNIT/ML injection, Inject 0-35 Units into the skin 3 (three) times daily as needed for high blood sugar. Sliding scale , Disp: , Rfl:  .  insulin detemir (LEVEMIR) 100 UNIT/ML FlexPen, Inject 10 Units into the skin daily., Disp: 15 mL, Rfl: 1 .  Insulin Pen Needle 32G X 4 MM MISC, Use as directed with insulin pen, Disp: 100 each, Rfl: 1 .  mycophenolate (MYFORTIC) 360 MG TBEC, Take 360 mg by mouth 2 (two) times daily., Disp: , Rfl:  .  omeprazole (PRILOSEC) 20 MG capsule, Take 1 capsule (20 mg total) by mouth daily., Disp: 90 capsule, Rfl: 3 .  ONETOUCH VERIO test strip, 1 each by Other route 3 (three) times daily., Disp: , Rfl:  .  tacrolimus (PROGRAF) 1 MG capsule, Take 1 mg by mouth 2 (two) times daily. , Disp: , Rfl:  .  oxyCODONE (OXY IR/ROXICODONE) 5 MG immediate release tablet, Take 1 tablet (5 mg total) by mouth every 4 (four) hours as needed for severe pain., Disp: 20 tablet, Rfl: 0  Review of Systems:  Constitutional: Denies fever, chills, diaphoresis, appetite change. HEENT: Denies photophobia, eye pain, redness, hearing loss, ear pain, congestion, sore throat, rhinorrhea, sneezing, mouth sores, trouble swallowing, neck pain, neck stiffness and  tinnitus.   Respiratory: Denies SOB, DOE, cough, chest tightness,  and wheezing.   Cardiovascular: Denies chest pain, palpitations and leg swelling.  Gastrointestinal: Denies nausea, vomiting, abdominal pain, diarrhea, constipation, blood in stool and abdominal distention.  Genitourinary: Denies dysuria, urgency, frequency, hematuria, flank pain and difficulty urinating.  Endocrine: Denies: hot or cold intolerance, sweats, changes in hair or nails, polyuria, polydipsia. Musculoskeletal: Denies myalgias, back pain, joint swelling, arthralgias and gait problem.  Skin: Denies pallor, rash and wound.  Neurological: Denies dizziness, seizures, syncope, weakness, light-headedness, numbness and headaches.  Hematological: Denies adenopathy. Easy bruising, personal or family bleeding history  Psychiatric/Behavioral: Denies suicidal ideation, mood changes, confusion, nervousness, sleep disturbance and agitation    Physical Exam: Vitals:   02/21/20 1324  BP: 114/80  Pulse: 77  Temp: 98.5 F (36.9 C)  TempSrc: Oral  SpO2: 98%  Weight: (!) 243 lb 12.8 oz (110.6 kg)  Height: 5' 11.5" (1.816 m)   Body mass index is 33.53 kg/m.   Constitutional: NAD, calm, comfortable, obese Eyes: PERRL, lids and conjunctivae normal ENMT: Mucous membranes are moist.  Respiratory: clear to auscultation bilaterally, no wheezing, no crackles. Normal respiratory effort. No accessory muscle use.  Cardiovascular: Regular rate and rhythm, no murmurs / rubs / gallops.  2+ pitting edema right greater than left lower extremity. Neurologic: Grossly intact and nonfocal. Psychiatric: Normal judgment and insight. Alert and oriented x 3. Normal mood.    Impression and Plan:  Type 2 diabetes mellitus with stage 3 chronic kidney disease, with long-term current use of insulin, unspecified whether stage 3a or 3b CKD ([place]) -Uncontrolled with an A1c of 9.4 in May. -He will track his CBGs 3 times a day and bring his chart into  his next visit. -Takes Levemir 10 units at midday and NovoLog twice daily with breakfast and dinner and a sliding scale, on average she does 20 units twice daily.  The Levemir was a new addition from his hospitalization in May.  He is also on glyburide 5 mg twice daily.  Hyperlipidemia, unspecified hyperlipidemia type -On statin and TriCor, last LDL was 33 in March.  Essential hypertension  -Well-controlled  Kidney transplant recipient -Followed by nephrology. -We will allow 20 tablets of oxycodone 5 mg today. -PDMP has been reviewed, no red flags, overdose risk score is 170.  Obesity, Class III, BMI 40-49.9 (morbid obesity) ([place]) -Discussed healthy lifestyle, including increased physical activity and better food choices to promote weight loss.  Fatigue, unspecified type  - Plan: TSH, Vitamin B12, VITAMIN D 25 Hydroxy (Vit-D Deficiency, Fractures)    Patient Instructions  -Nice seeing you today!!  -Lab work today; will notify you once results are available.  -Check your blood sugar before breakfast, lunch and dinner and bring that chart in to your next visit.  -Schedule follow up in 3 months.     Lelon Frohlich, MD  [place] [facility]

## 2020-02-22 LAB — CBC WITH DIFFERENTIAL/PLATELET
Absolute Monocytes: 387 cells/uL (ref 200–950)
Basophils Absolute: 22 cells/uL (ref 0–200)
Basophils Relative: 0.5 %
Eosinophils Absolute: 52 cells/uL (ref 15–500)
Eosinophils Relative: 1.2 %
HCT: 33.5 % — ABNORMAL LOW (ref 38.5–50.0)
Hemoglobin: 11.2 g/dL — ABNORMAL LOW (ref 13.2–17.1)
Lymphs Abs: 624 cells/uL — ABNORMAL LOW (ref 850–3900)
MCH: 27.7 pg (ref 27.0–33.0)
MCHC: 33.4 g/dL (ref 32.0–36.0)
MCV: 82.9 fL (ref 80.0–100.0)
MPV: 9.8 fL (ref 7.5–12.5)
Monocytes Relative: 9 %
Neutro Abs: 3216 cells/uL (ref 1500–7800)
Neutrophils Relative %: 74.8 %
Platelets: 113 10*3/uL — ABNORMAL LOW (ref 140–400)
RBC: 4.04 10*6/uL — ABNORMAL LOW (ref 4.20–5.80)
RDW: 14.7 % (ref 11.0–15.0)
Total Lymphocyte: 14.5 %
WBC: 4.3 10*3/uL (ref 3.8–10.8)

## 2020-02-22 LAB — VITAMIN D 25 HYDROXY (VIT D DEFICIENCY, FRACTURES): Vit D, 25-Hydroxy: 18 ng/mL — ABNORMAL LOW (ref 30–100)

## 2020-02-22 LAB — TSH: TSH: 1.72 mIU/L (ref 0.40–4.50)

## 2020-02-22 LAB — VITAMIN B12: Vitamin B-12: 287 pg/mL (ref 200–1100)

## 2020-02-25 ENCOUNTER — Other Ambulatory Visit: Payer: Self-pay | Admitting: Internal Medicine

## 2020-02-25 ENCOUNTER — Encounter: Payer: Self-pay | Admitting: Internal Medicine

## 2020-02-25 DIAGNOSIS — D696 Thrombocytopenia, unspecified: Secondary | ICD-10-CM | POA: Insufficient documentation

## 2020-02-25 DIAGNOSIS — E559 Vitamin D deficiency, unspecified: Secondary | ICD-10-CM | POA: Insufficient documentation

## 2020-02-25 DIAGNOSIS — D649 Anemia, unspecified: Secondary | ICD-10-CM | POA: Insufficient documentation

## 2020-02-25 MED ORDER — VITAMIN D (ERGOCALCIFEROL) 1.25 MG (50000 UNIT) PO CAPS: 50000.0000 [IU] | ORAL_CAPSULE | ORAL | 0 refills | Status: DC

## 2020-02-26 ENCOUNTER — Telehealth: Payer: Self-pay | Admitting: Internal Medicine

## 2020-02-26 ENCOUNTER — Other Ambulatory Visit: Payer: Self-pay | Admitting: Internal Medicine

## 2020-02-26 DIAGNOSIS — E559 Vitamin D deficiency, unspecified: Secondary | ICD-10-CM

## 2020-02-26 NOTE — Telephone Encounter (Signed)
Pt returned call back to the office.

## 2020-02-26 NOTE — Telephone Encounter (Signed)
Spoke with patient and reviewed lab results. 

## 2020-02-28 ENCOUNTER — Encounter: Payer: Self-pay | Admitting: *Deleted

## 2020-02-28 ENCOUNTER — Telehealth: Payer: Self-pay | Admitting: *Deleted

## 2020-02-28 DIAGNOSIS — G8929 Other chronic pain: Secondary | ICD-10-CM | POA: Insufficient documentation

## 2020-02-28 NOTE — Telephone Encounter (Signed)
Prior Auth started for  Oxycodone 5 mg Key [id number]

## 2020-03-04 NOTE — Telephone Encounter (Signed)
Oxycodone 5 mg  Approved 02/28/20 - 08/30/20.

## 2020-03-11 ENCOUNTER — Telehealth: Payer: Self-pay | Admitting: Internal Medicine

## 2020-03-11 MED ORDER — BD PEN NEEDLE NANO 2ND GEN 32G X 4 MM MISC: 3 refills | Status: DC

## 2020-03-11 NOTE — Telephone Encounter (Signed)
Pt called to say he needs a origanal prescription for his insuline pen needles (Nano ultra fine)   [facility]  Please advise

## 2020-03-11 NOTE — Telephone Encounter (Signed)
Refill sent.

## 2020-03-27 ENCOUNTER — Ambulatory Visit (AMBULATORY_SURGERY_CENTER): Payer: Self-pay | Admitting: *Deleted

## 2020-03-27 ENCOUNTER — Encounter: Payer: Self-pay | Admitting: Gastroenterology

## 2020-03-27 ENCOUNTER — Other Ambulatory Visit: Payer: Self-pay

## 2020-03-27 VITALS — Ht 71.5 in | Wt 245.8 lb

## 2020-03-27 DIAGNOSIS — Z1211 Encounter for screening for malignant neoplasm of colon: Secondary | ICD-10-CM

## 2020-03-27 NOTE — Progress Notes (Signed)
Patient denies any allergies to egg or soy products. Patient denies complications with anesthesia/sedation.  Patient denies oxygen use at home and denies diet medications. Dx with sleep apnea, uses CPAP nightly.  Patient denies Emmi instructions for colonoscopy. Patient had both covid vaccinations.

## 2020-04-08 ENCOUNTER — Telehealth: Payer: Self-pay | Admitting: Gastroenterology

## 2020-04-08 NOTE — Telephone Encounter (Signed)
Patient paged on call to inform that wife just tested positive for Covid. He is scheduled for colonoscopy on 04/10/20 for routine screening purposes. He o/w feels well and has received covid vaccine. Will instead plan on contacting his PCM in the AM to discuss quarantine plan and will postpone colonoscopy. All questions answered and appreciative of call back.

## 2020-04-10 ENCOUNTER — Encounter: Payer: BC Managed Care – PPO | Admitting: Gastroenterology

## 2020-05-13 ENCOUNTER — Other Ambulatory Visit: Payer: Self-pay | Admitting: Internal Medicine

## 2020-05-13 DIAGNOSIS — E559 Vitamin D deficiency, unspecified: Secondary | ICD-10-CM

## 2020-05-24 ENCOUNTER — Telehealth: Payer: BC Managed Care – PPO | Admitting: Nurse Practitioner

## 2020-05-24 DIAGNOSIS — L03116 Cellulitis of left lower limb: Secondary | ICD-10-CM

## 2020-05-24 MED ORDER — CEPHALEXIN 500 MG PO CAPS: 500.0000 mg | ORAL_CAPSULE | Freq: Four times a day (QID) | ORAL | 0 refills | Status: DC

## 2020-05-24 NOTE — Progress Notes (Signed)
E Visit for Cellulitis ° °We are sorry that you are not feeling well. Here is how we plan to help! ° °Based on what you shared with me it looks like you have cellulitis.  Cellulitis looks like areas of skin redness, swelling, and warmth; it develops as a result of bacteria entering under the skin. Little red spots and/or bleeding can be seen in skin, and tiny surface sacs containing fluid can occur. Fever can be present. Cellulitis is almost always on one side of a body, and the lower limbs are the most common site of involvement.  ° °I have prescribed:  Keflex 500mg take one by mouth four times a day for 5 days ° °HOME CARE: ° °Take your medications as ordered and take all of them, even if the skin irritation appears to be healing.  ° °GET HELP RIGHT AWAY IF: ° °Symptoms that don't begin to go away within 48 hours. °Severe redness persists or worsens °If the area turns color, spreads or swells. °If it blisters and opens, develops yellow-brown crust or bleeds. °You develop a fever or chills. °If the pain increases or becomes unbearable.  °Are unable to keep fluids and food down. ° °MAKE SURE YOU  ° °Understand these instructions. °Will watch your condition. °Will get help right away if you are not doing well or get worse. ° °Thank you for choosing an e-visit. ° °Your e-visit answers were reviewed by a board certified advanced clinical practitioner to complete your personal care plan. Depending upon the condition, your plan could have included both over the counter or prescription medications. ° °Please review your pharmacy choice. Make sure the pharmacy is open so you can pick up prescription now. If there is a problem, you may contact your provider through MyChart messaging and have the prescription routed to another pharmacy.  Your safety is important to us. If you have drug allergies check your prescription carefully.  ° °For the next 24 hours you can use MyChart to ask questions about today's visit, request a  non-urgent call back, or ask for a work or school excuse. °You will get an email in the next two days asking about your experience. I hope that your e-visit has been valuable and will speed your recovery. ° ° °5-10 minutes spent reviewing and documenting in chart. ° °

## 2020-05-29 ENCOUNTER — Ambulatory Visit
Admission: EM | Admit: 2020-05-29 | Discharge: 2020-05-29 | Disposition: A | Discharge status: Home / Self Care | Payer: BC Managed Care – PPO | Attending: Physician Assistant | Admitting: Physician Assistant

## 2020-05-29 ENCOUNTER — Other Ambulatory Visit: Payer: Self-pay

## 2020-05-29 ENCOUNTER — Encounter: Payer: Self-pay | Admitting: Emergency Medicine

## 2020-05-29 DIAGNOSIS — Z5189 Encounter for other specified aftercare: Secondary | ICD-10-CM | POA: Diagnosis not present

## 2020-05-29 MED ORDER — DOXYCYCLINE HYCLATE 100 MG PO CAPS
100.0000 mg | ORAL_CAPSULE | Freq: Two times a day (BID) | ORAL | 0 refills | Status: DC
Start: 2020-05-29 — End: 2020-06-10

## 2020-05-29 NOTE — ED Provider Notes (Signed)
[facility]    CSN: [id number] Arrival date & time: 05/29/20  0815      History   Chief Complaint Chief Complaint  Patient presents with  . Wound Check    HPI Steven [name] is a 64 y.o. male.   64 year old male with history of DM, HLD, kidney transplant recipient, venous insufficiency comes in for 2 week history of left foot wound. States sustained wound due to ill fitting shoes. Did e-visit and was started on Keflex Q6H x 5 days. States redness has improved, but pain still significant. Denies fever.      Past Medical History:  Diagnosis Date  . Cataract    removed by surgery  . Diabetes ([place])    type 2  . 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 Corliss Parish.  . GERD (gastroesophageal reflux disease)   . HLD (hyperlipidemia)   . Hyperlipidemia   . Hyperparathyroidism ([place])   . Hypertension    Patient denies this DX, no meds  . Kidney transplant recipient   . Obesity   . Sleep apnea    uses CPAP nightly  . Venous insufficiency   . Vitamin D deficiency     Patient Active Problem List   Diagnosis Date Noted  . Chronic pain 02/28/2020  . Vitamin D deficiency 02/25/2020  . Thrombocytopenia ([place]) 02/25/2020  . Normocytic anemia 02/25/2020  . Chronic venous hypertension (idiopathic) with ulcer and inflammation of right lower extremity ([place])   . 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 Diabetic polyneuropathy associated with type 2 diabetes mellitus ([place])   . Venous insufficiency   . Cellulitis of right foot 12/21/2019  . Obesity, Class III, BMI 40-49.9 (morbid obesity) ([place])   . Osteomyelitis ([place]) 07/17/2017  . Kidney transplant recipient 05/24/2015  . Diabetes mellitus ([place]) 05/10/2012  . Hyperlipidemia 05/10/2012  . Essential hypertension 05/10/2012    Past Surgical History:  Procedure Laterality Date  . AMPUTATION Right 07/19/2017    Procedure: AMPUTATION RIGHT 2ND TOE;  Surgeon: Leandrew Koyanagi, MD;  Location: [place];  Service: Orthopedics;  Laterality: Right;  . COLONOSCOPY     greater than 10 yrs ago  . EYE SURGERY Bilateral    cataracts removed  . KIDNEY TRANSPLANT  2006  . NEPHRECTOMY TRANSPLANTED ORGAN  2006  . UPPER GASTROINTESTINAL ENDOSCOPY    . WISDOM TOOTH EXTRACTION         Home Medications    Prior to Admission medications   Medication Sig Start Date End Date Taking? Authorizing Provider  atorvastatin (LIPITOR) 10 MG tablet Take 10 mg by mouth daily.  07/07/17   [provider]  cephALEXin (KEFLEX) 500 MG capsule Take 1 capsule (500 mg total) by mouth 4 (four) times daily. 05/24/20   Hassell Done, Mary-Margaret, FNP  doxycycline (VIBRAMYCIN) 100 MG capsule Take 1 capsule (100 mg total) by mouth 2 (two) times daily. 05/29/20   Tasia Catchings, Keyleen Cerrato V, PA-C  fenofibrate (TRICOR) 48 MG tablet Take 48 mg by mouth daily.    [provider]  furosemide (LASIX) 40 MG tablet Take 40 mg by mouth daily as needed for fluid (swelling).  07/06/17   [provider]  glyBURIDE (DIABETA) 5 MG tablet Take 5 mg by mouth 2 (two) times daily with a meal.     [provider]  insulin aspart (NOVOLOG) 100 UNIT/ML injection Inject 0-35 Units into the skin  3 (three) times daily as needed for high blood sugar. Sliding scale     [provider]  insulin detemir (LEVEMIR) 100 UNIT/ML FlexPen Inject 10 Units into the skin daily. 01/30/20   Nicolette Bang, DO  Insulin Pen Needle (BD PEN NEEDLE NANO 2ND GEN) 32G X 4 MM MISC Use daily for glucose control 03/11/20   Isaac Bliss, Rayford Halsted, MD  Insulin Pen Needle 32G X 4 MM MISC Use as directed with insulin pen 12/24/19   Geradine Girt, DO  mycophenolate (MYFORTIC) 360 MG TBEC Take 360 mg by mouth 2 (two) times daily.    [provider]  omeprazole (PRILOSEC) 20 MG capsule Take 1 capsule (20 mg total) by mouth daily. 01/30/20   Nicolette Bang, DO  ONETOUCH VERIO test strip 1 each by Other route 3 (three) times daily. 01/20/20   [provider]  oxyCODONE (OXY IR/ROXICODONE) 5 MG immediate release tablet Take 1 tablet (5 mg total) by mouth every 4 (four) hours as needed for severe pain. 02/21/20   Isaac Bliss, Rayford Halsted, MD  tacrolimus (PROGRAF) 1 MG capsule Take 1 mg by mouth 2 (two) times daily.     [provider]  Vitamin D, Ergocalciferol, (DRISDOL) 1.25 MG (50000 UNIT) CAPS capsule TAKE 1 CAPSULE (50,000 UNITS TOTAL) BY MOUTH EVERY 7 (SEVEN) DAYS FOR 12 DOSES. 05/13/20 07/30/20  Erline Hau, MD    Family History Family History  Problem Relation Age of Onset  . Healthy Mother   . Healthy Father   . Colon cancer Neg Hx   . Rectal cancer Neg Hx   . Stomach cancer Neg Hx     Social History Social History   Tobacco Use  . Smoking status: Never Smoker  . Smokeless tobacco: Former Systems developer    Types: Secondary school teacher  . Vaping Use: Never used  Substance Use Topics  . Alcohol use: No  . Drug use: No     Allergies   Sulfa antibiotics   Review of Systems Review of Systems  Reason unable to perform ROS: See HPI as above.     Physical Exam Triage Vital Signs ED Triage Vitals  Enc Vitals Group     BP 05/29/20 0825 119/70     Pulse Rate 05/29/20 0825 82     Resp 05/29/20 0825 18     Temp 05/29/20 0825 98.1 F (36.7 C)     Temp Source 05/29/20 0825 Oral     SpO2 05/29/20 0825 98 %     Weight --      Height --      Head Circumference --      Peak Flow --      Pain Score 05/29/20 0826 7     Pain Loc --      Pain Edu? --      Excl. in [place]? --    No data found.  Updated Vital Signs BP 119/70 (BP Location: Left Arm)   Pulse 82   Temp 98.1 F (36.7 C) (Oral)   Resp 18   SpO2 98%   Physical Exam Constitutional:      General: He is not in acute distress.    Appearance: Normal appearance. He is well-developed. He is not toxic-appearing or diaphoretic.  HENT:      Head: Normocephalic and atraumatic.  Eyes:     Conjunctiva/sclera: Conjunctivae normal.     Pupils: Pupils are equal, round, and reactive to light.  Pulmonary:     Effort: Pulmonary effort is normal. No respiratory distress.  Musculoskeletal:     Cervical back: Normal range of motion and neck supple.     Comments: See picture below.  Open bulla to plantar surface of left foot with serous drainage.  Tender to palpation.  Ulcer to the left lateral foot.  No active drainage. No significant swelling, erythema, warmth. NVI.   Skin:    General: Skin is warm and dry.  Neurological:     Mental Status: He is alert and oriented to person, place, and time.          UC Treatments / Results  Labs (all labs ordered are listed, but only abnormal results are displayed) Labs Reviewed - No data to display  EKG   Radiology No results found.  Procedures Procedures (including critical care time)  Medications Ordered in UC Medications - No data to display  Initial Impression / Assessment and Plan / UC Course  I have reviewed the triage vital signs and the nursing notes.  Pertinent labs & imaging results that were available during my care of the patient were reviewed by me and considered in my medical decision making (see chart for details).    Worrisome wound to the lateral left foot. Patient to finish keflex today and start doxycycline as directed. Wound care instructions given. Otherwise close follow up with PCP/wound care for management. Return precautions given.  Final Clinical Impressions(s) / UC Diagnoses   Final diagnoses:  Visit for wound check    ED Prescriptions    Medication Sig Dispense Auth. Provider   doxycycline (VIBRAMYCIN) 100 MG capsule Take 1 capsule (100 mg total) by mouth 2 (two) times daily. 14 capsule Tasia Catchings, Rainey Rodger V, PA-C     I have reviewed the PDMP during this encounter.   Ok Edwards, PA-C 05/29/20 [phone]

## 2020-05-29 NOTE — ED Triage Notes (Addendum)
Pt here for wound to left foot with increased pain; pt sts wound x 2 weeks and is a diabetic; pt has been taking keflex since last week

## 2020-05-29 NOTE — Discharge Instructions (Addendum)
Start doxycycline as directed. Keep wound clean and dry. Daily dressings. Please follow up with PCP/wound care for monitoring and healing of wounds. If worsening symptoms, fever, red streaks to the leg, black tissue, go to the emergency department for further evaluation.

## 2020-06-01 ENCOUNTER — Other Ambulatory Visit: Payer: Self-pay

## 2020-06-01 ENCOUNTER — Emergency Department (HOSPITAL_COMMUNITY): Payer: BC Managed Care – PPO

## 2020-06-01 ENCOUNTER — Encounter (HOSPITAL_COMMUNITY): Payer: Self-pay

## 2020-06-01 ENCOUNTER — Inpatient Hospital Stay (HOSPITAL_COMMUNITY)
Admission: EM | Admit: 2020-06-01 | Discharge: 2020-06-10 | DRG: 616 | Disposition: A | Discharge status: Rehabilitation Facility | Payer: BC Managed Care – PPO | Attending: Internal Medicine | Admitting: Internal Medicine

## 2020-06-01 DIAGNOSIS — L02612 Cutaneous abscess of left foot: Secondary | ICD-10-CM | POA: Diagnosis present

## 2020-06-01 DIAGNOSIS — E119 Type 2 diabetes mellitus without complications: Secondary | ICD-10-CM

## 2020-06-01 DIAGNOSIS — E1122 Type 2 diabetes mellitus with diabetic chronic kidney disease: Secondary | ICD-10-CM | POA: Diagnosis present

## 2020-06-01 DIAGNOSIS — N183 Chronic kidney disease, stage 3 unspecified: Secondary | ICD-10-CM

## 2020-06-01 DIAGNOSIS — D631 Anemia in chronic kidney disease: Secondary | ICD-10-CM | POA: Diagnosis present

## 2020-06-01 DIAGNOSIS — S91302A Unspecified open wound, left foot, initial encounter: Secondary | ICD-10-CM | POA: Diagnosis not present

## 2020-06-01 DIAGNOSIS — L089 Local infection of the skin and subcutaneous tissue, unspecified: Secondary | ICD-10-CM | POA: Diagnosis not present

## 2020-06-01 DIAGNOSIS — Z20822 Contact with and (suspected) exposure to covid-19: Secondary | ICD-10-CM | POA: Diagnosis present

## 2020-06-01 DIAGNOSIS — N269 Renal sclerosis, unspecified: Secondary | ICD-10-CM | POA: Diagnosis present

## 2020-06-01 DIAGNOSIS — Z79899 Other long term (current) drug therapy: Secondary | ICD-10-CM

## 2020-06-01 DIAGNOSIS — D696 Thrombocytopenia, unspecified: Secondary | ICD-10-CM | POA: Diagnosis not present

## 2020-06-01 DIAGNOSIS — L03116 Cellulitis of left lower limb: Secondary | ICD-10-CM | POA: Diagnosis present

## 2020-06-01 DIAGNOSIS — R011 Cardiac murmur, unspecified: Secondary | ICD-10-CM | POA: Diagnosis not present

## 2020-06-01 DIAGNOSIS — Z882 Allergy status to sulfonamides status: Secondary | ICD-10-CM

## 2020-06-01 DIAGNOSIS — E11628 Type 2 diabetes mellitus with other skin complications: Secondary | ICD-10-CM

## 2020-06-01 DIAGNOSIS — R0902 Hypoxemia: Secondary | ICD-10-CM | POA: Diagnosis not present

## 2020-06-01 DIAGNOSIS — I350 Nonrheumatic aortic (valve) stenosis: Secondary | ICD-10-CM | POA: Diagnosis present

## 2020-06-01 DIAGNOSIS — E11621 Type 2 diabetes mellitus with foot ulcer: Secondary | ICD-10-CM | POA: Diagnosis present

## 2020-06-01 DIAGNOSIS — E1152 Type 2 diabetes mellitus with diabetic peripheral angiopathy with gangrene: Secondary | ICD-10-CM | POA: Diagnosis present

## 2020-06-01 DIAGNOSIS — I352 Nonrheumatic aortic (valve) stenosis with insufficiency: Secondary | ICD-10-CM | POA: Diagnosis not present

## 2020-06-01 DIAGNOSIS — E669 Obesity, unspecified: Secondary | ICD-10-CM | POA: Diagnosis not present

## 2020-06-01 DIAGNOSIS — E781 Pure hyperglyceridemia: Secondary | ICD-10-CM | POA: Diagnosis present

## 2020-06-01 DIAGNOSIS — E785 Hyperlipidemia, unspecified: Secondary | ICD-10-CM

## 2020-06-01 DIAGNOSIS — Y830 Surgical operation with transplant of whole organ as the cause of abnormal reaction of the patient, or of later complication, without mention of misadventure at the time of the procedure: Secondary | ICD-10-CM | POA: Diagnosis present

## 2020-06-01 DIAGNOSIS — E1165 Type 2 diabetes mellitus with hyperglycemia: Secondary | ICD-10-CM | POA: Diagnosis present

## 2020-06-01 DIAGNOSIS — E860 Dehydration: Secondary | ICD-10-CM | POA: Diagnosis present

## 2020-06-01 DIAGNOSIS — Z794 Long term (current) use of insulin: Secondary | ICD-10-CM | POA: Diagnosis not present

## 2020-06-01 DIAGNOSIS — Z940 Kidney transplant status: Secondary | ICD-10-CM

## 2020-06-01 DIAGNOSIS — Z01810 Encounter for preprocedural cardiovascular examination: Secondary | ICD-10-CM | POA: Diagnosis not present

## 2020-06-01 DIAGNOSIS — I2510 Atherosclerotic heart disease of native coronary artery without angina pectoris: Secondary | ICD-10-CM | POA: Diagnosis present

## 2020-06-01 DIAGNOSIS — E1169 Type 2 diabetes mellitus with other specified complication: Principal | ICD-10-CM | POA: Diagnosis present

## 2020-06-01 DIAGNOSIS — G4733 Obstructive sleep apnea (adult) (pediatric): Secondary | ICD-10-CM | POA: Diagnosis present

## 2020-06-01 DIAGNOSIS — D84821 Immunodeficiency due to drugs: Secondary | ICD-10-CM | POA: Diagnosis present

## 2020-06-01 DIAGNOSIS — R0989 Other specified symptoms and signs involving the circulatory and respiratory systems: Secondary | ICD-10-CM | POA: Diagnosis present

## 2020-06-01 DIAGNOSIS — I5031 Acute diastolic (congestive) heart failure: Secondary | ICD-10-CM | POA: Diagnosis present

## 2020-06-01 DIAGNOSIS — E213 Hyperparathyroidism, unspecified: Secondary | ICD-10-CM | POA: Diagnosis present

## 2020-06-01 DIAGNOSIS — N1832 Chronic kidney disease, stage 3b: Secondary | ICD-10-CM | POA: Diagnosis present

## 2020-06-01 DIAGNOSIS — M869 Osteomyelitis, unspecified: Secondary | ICD-10-CM | POA: Diagnosis present

## 2020-06-01 DIAGNOSIS — M779 Enthesopathy, unspecified: Secondary | ICD-10-CM | POA: Diagnosis present

## 2020-06-01 DIAGNOSIS — I130 Hypertensive heart and chronic kidney disease with heart failure and stage 1 through stage 4 chronic kidney disease, or unspecified chronic kidney disease: Secondary | ICD-10-CM | POA: Diagnosis present

## 2020-06-01 DIAGNOSIS — Z89512 Acquired absence of left leg below knee: Secondary | ICD-10-CM

## 2020-06-01 DIAGNOSIS — T8619 Other complication of kidney transplant: Secondary | ICD-10-CM | POA: Diagnosis present

## 2020-06-01 DIAGNOSIS — E559 Vitamin D deficiency, unspecified: Secondary | ICD-10-CM | POA: Diagnosis present

## 2020-06-01 DIAGNOSIS — M86272 Subacute osteomyelitis, left ankle and foot: Secondary | ICD-10-CM

## 2020-06-01 DIAGNOSIS — Z87891 Personal history of nicotine dependence: Secondary | ICD-10-CM

## 2020-06-01 DIAGNOSIS — N179 Acute kidney failure, unspecified: Secondary | ICD-10-CM | POA: Diagnosis present

## 2020-06-01 DIAGNOSIS — I872 Venous insufficiency (chronic) (peripheral): Secondary | ICD-10-CM | POA: Diagnosis present

## 2020-06-01 DIAGNOSIS — I342 Nonrheumatic mitral (valve) stenosis: Secondary | ICD-10-CM | POA: Diagnosis not present

## 2020-06-01 DIAGNOSIS — R0789 Other chest pain: Secondary | ICD-10-CM | POA: Diagnosis not present

## 2020-06-01 DIAGNOSIS — M86072 Acute hematogenous osteomyelitis, left ankle and foot: Secondary | ICD-10-CM

## 2020-06-01 DIAGNOSIS — D62 Acute posthemorrhagic anemia: Secondary | ICD-10-CM | POA: Diagnosis not present

## 2020-06-01 DIAGNOSIS — R0603 Acute respiratory distress: Secondary | ICD-10-CM

## 2020-06-01 DIAGNOSIS — L97419 Non-pressure chronic ulcer of right heel and midfoot with unspecified severity: Secondary | ICD-10-CM | POA: Diagnosis present

## 2020-06-01 DIAGNOSIS — Z6834 Body mass index (BMI) 34.0-34.9, adult: Secondary | ICD-10-CM

## 2020-06-01 DIAGNOSIS — L97529 Non-pressure chronic ulcer of other part of left foot with unspecified severity: Secondary | ICD-10-CM | POA: Diagnosis present

## 2020-06-01 DIAGNOSIS — I739 Peripheral vascular disease, unspecified: Secondary | ICD-10-CM | POA: Diagnosis not present

## 2020-06-01 DIAGNOSIS — I340 Nonrheumatic mitral (valve) insufficiency: Secondary | ICD-10-CM | POA: Diagnosis not present

## 2020-06-01 DIAGNOSIS — K219 Gastro-esophageal reflux disease without esophagitis: Secondary | ICD-10-CM | POA: Diagnosis present

## 2020-06-01 LAB — COMPREHENSIVE METABOLIC PANEL
ALT: 27 U/L (ref 0–44)
AST: 18 U/L (ref 15–41)
Albumin: 3.3 g/dL — ABNORMAL LOW (ref 3.5–5.0)
Alkaline Phosphatase: 116 U/L (ref 38–126)
Anion gap: 15 (ref 5–15)
BUN: 40 mg/dL — ABNORMAL HIGH (ref 8–23)
CO2: 23 mmol/L (ref 22–32)
Calcium: 8.9 mg/dL (ref 8.9–10.3)
Chloride: 99 mmol/L (ref 98–111)
Creatinine, Ser: 2.07 mg/dL — ABNORMAL HIGH (ref 0.61–1.24)
GFR, Estimated: 36 mL/min — ABNORMAL LOW (ref >60)
Glucose, Bld: 243 mg/dL — ABNORMAL HIGH (ref 70–99)
Potassium: 4.6 mmol/L (ref 3.5–5.1)
Sodium: 137 mmol/L (ref 135–145)
Total Bilirubin: 1.2 mg/dL (ref 0.3–1.2)
Total Protein: 6.9 g/dL (ref 6.5–8.1)

## 2020-06-01 LAB — CBC WITH DIFFERENTIAL/PLATELET
Abs Immature Granulocytes: 0.05 10*3/uL (ref 0.00–0.07)
Basophils Absolute: 0 10*3/uL (ref 0.0–0.1)
Basophils Relative: 0 %
Eosinophils Absolute: 0.1 10*3/uL (ref 0.0–0.5)
Eosinophils Relative: 1 %
HCT: 31.8 % — ABNORMAL LOW (ref 39.0–52.0)
Hemoglobin: 10.1 g/dL — ABNORMAL LOW (ref 13.0–17.0)
Immature Granulocytes: 1 %
Lymphocytes Relative: 7 %
Lymphs Abs: 0.5 10*3/uL — ABNORMAL LOW (ref 0.7–4.0)
MCH: 26.2 pg (ref 26.0–34.0)
MCHC: 31.8 g/dL (ref 30.0–36.0)
MCV: 82.6 fL (ref 80.0–100.0)
Monocytes Absolute: 0.5 10*3/uL (ref 0.1–1.0)
Monocytes Relative: 8 %
Neutro Abs: 5.3 10*3/uL (ref 1.7–7.7)
Neutrophils Relative %: 83 %
Platelets: 146 10*3/uL — ABNORMAL LOW (ref 150–400)
RBC: 3.85 MIL/uL — ABNORMAL LOW (ref 4.22–5.81)
RDW: 14.1 % (ref 11.5–15.5)
WBC: 6.4 10*3/uL (ref 4.0–10.5)
nRBC: 0 % (ref 0.0–0.2)

## 2020-06-01 LAB — HEMOGLOBIN A1C
Hgb A1c MFr Bld: 9.3 % — ABNORMAL HIGH (ref 4.8–5.6)
Mean Plasma Glucose: 220.21 mg/dL

## 2020-06-01 LAB — RESPIRATORY PANEL BY RT PCR (FLU A&B, COVID)
Influenza A by PCR: NEGATIVE
Influenza B by PCR: NEGATIVE
SARS Coronavirus 2 by RT PCR: NEGATIVE

## 2020-06-01 LAB — GLUCOSE, CAPILLARY
Glucose-Capillary: 232 mg/dL — ABNORMAL HIGH (ref 70–99)
Glucose-Capillary: 278 mg/dL — ABNORMAL HIGH (ref 70–99)

## 2020-06-01 LAB — LACTIC ACID, PLASMA: Lactic Acid, Venous: 1.1 mmol/L (ref 0.5–1.9)

## 2020-06-01 MED ORDER — FUROSEMIDE 40 MG PO TABS: 40.0000 mg | ORAL_TABLET | Freq: Every day | ORAL | Status: DC | PRN

## 2020-06-01 MED ORDER — ACETAMINOPHEN 650 MG RE SUPP: 650.0000 mg | Freq: Four times a day (QID) | RECTAL | Status: DC | PRN

## 2020-06-01 MED ORDER — PIPERACILLIN-TAZOBACTAM 3.375 G IVPB 30 MIN
3.3750 g | Freq: Once | INTRAVENOUS | Status: AC
  Administered 2020-06-01: 3.375 g via INTRAVENOUS
  Filled 2020-06-01: qty 50

## 2020-06-01 MED ORDER — INSULIN ASPART 100 UNIT/ML ~~LOC~~ SOLN
0.0000 [IU] | Freq: Three times a day (TID) | SUBCUTANEOUS | Status: DC
  Administered 2020-06-01 – 2020-06-02 (×2): 5 [IU] via SUBCUTANEOUS
  Administered 2020-06-02: 8 [IU] via SUBCUTANEOUS
  Administered 2020-06-02: 5 [IU] via SUBCUTANEOUS
  Administered 2020-06-03 (×2): 8 [IU] via SUBCUTANEOUS
  Administered 2020-06-04 (×2): 15 [IU] via SUBCUTANEOUS
  Administered 2020-06-04 – 2020-06-05 (×2): 5 [IU] via SUBCUTANEOUS
  Administered 2020-06-05 (×2): 3 [IU] via SUBCUTANEOUS
  Administered 2020-06-06: 5 [IU] via SUBCUTANEOUS
  Administered 2020-06-06: 3 [IU] via SUBCUTANEOUS
  Administered 2020-06-06: 8 [IU] via SUBCUTANEOUS
  Administered 2020-06-07 (×2): 3 [IU] via SUBCUTANEOUS
  Administered 2020-06-07: 5 [IU] via SUBCUTANEOUS
  Administered 2020-06-08: 2 [IU] via SUBCUTANEOUS
  Administered 2020-06-08 (×2): 5 [IU] via SUBCUTANEOUS
  Administered 2020-06-09: 2 [IU] via SUBCUTANEOUS
  Administered 2020-06-10: 3 [IU] via SUBCUTANEOUS

## 2020-06-01 MED ORDER — FENOFIBRATE 54 MG PO TABS
54.0000 mg | ORAL_TABLET | Freq: Every day | ORAL | Status: DC
  Administered 2020-06-02 – 2020-06-10 (×7): 54 mg via ORAL
  Filled 2020-06-01 (×9): qty 1

## 2020-06-01 MED ORDER — SODIUM CHLORIDE 0.9 % IV BOLUS
500.0000 mL | Freq: Once | INTRAVENOUS | Status: AC
  Administered 2020-06-01: 500 mL via INTRAVENOUS

## 2020-06-01 MED ORDER — OXYCODONE HCL 5 MG PO TABS
5.0000 mg | ORAL_TABLET | ORAL | Status: DC | PRN
  Administered 2020-06-02 – 2020-06-10 (×7): 5 mg via ORAL
  Filled 2020-06-01 (×8): qty 1

## 2020-06-01 MED ORDER — VANCOMYCIN HCL 2000 MG/400ML IV SOLN
2000.0000 mg | Freq: Once | INTRAVENOUS | Status: AC
  Administered 2020-06-01: 2000 mg via INTRAVENOUS
  Filled 2020-06-01: qty 400

## 2020-06-01 MED ORDER — HEPARIN SODIUM (PORCINE) 5000 UNIT/ML IJ SOLN
5000.0000 [IU] | Freq: Three times a day (TID) | INTRAMUSCULAR | Status: DC
  Administered 2020-06-03 – 2020-06-07 (×12): 5000 [IU] via SUBCUTANEOUS
  Filled 2020-06-01 (×15): qty 1

## 2020-06-01 MED ORDER — ACETAMINOPHEN 325 MG PO TABS
650.0000 mg | ORAL_TABLET | Freq: Four times a day (QID) | ORAL | Status: DC | PRN
  Administered 2020-06-03: 650 mg via ORAL
  Filled 2020-06-01: qty 2

## 2020-06-01 MED ORDER — VANCOMYCIN HCL 1500 MG/300ML IV SOLN: 1500.0000 mg | INTRAVENOUS | Status: DC

## 2020-06-01 MED ORDER — SODIUM CHLORIDE 0.9 % IV SOLN
INTRAVENOUS | Status: DC | PRN
  Administered 2020-06-01: 250 mL via INTRAVENOUS

## 2020-06-01 MED ORDER — MYCOPHENOLATE SODIUM 180 MG PO TBEC
360.0000 mg | DELAYED_RELEASE_TABLET | Freq: Two times a day (BID) | ORAL | Status: DC
  Administered 2020-06-01 – 2020-06-10 (×18): 360 mg via ORAL
  Filled 2020-06-01 (×18): qty 2

## 2020-06-01 MED ORDER — PANTOPRAZOLE SODIUM 40 MG PO TBEC
40.0000 mg | DELAYED_RELEASE_TABLET | Freq: Every day | ORAL | Status: DC
  Administered 2020-06-01 – 2020-06-10 (×9): 40 mg via ORAL
  Filled 2020-06-01 (×9): qty 1

## 2020-06-01 MED ORDER — POLYETHYLENE GLYCOL 3350 17 G PO PACK: 17.0000 g | PACK | Freq: Every day | ORAL | Status: DC | PRN

## 2020-06-01 MED ORDER — VANCOMYCIN HCL IN DEXTROSE 1-5 GM/200ML-% IV SOLN: 1000.0000 mg | Freq: Once | INTRAVENOUS | Status: DC

## 2020-06-01 MED ORDER — TACROLIMUS 1 MG PO CAPS
1.0000 mg | ORAL_CAPSULE | Freq: Two times a day (BID) | ORAL | Status: DC
  Administered 2020-06-01 – 2020-06-10 (×18): 1 mg via ORAL
  Filled 2020-06-01 (×18): qty 1

## 2020-06-01 MED ORDER — INSULIN DETEMIR 100 UNIT/ML ~~LOC~~ SOLN
10.0000 [IU] | Freq: Every day | SUBCUTANEOUS | Status: DC
  Filled 2020-06-01: qty 0.1

## 2020-06-01 MED ORDER — INSULIN DETEMIR 100 UNIT/ML ~~LOC~~ SOLN
10.0000 [IU] | Freq: Every day | SUBCUTANEOUS | Status: DC
  Administered 2020-06-01 – 2020-06-09 (×9): 10 [IU] via SUBCUTANEOUS
  Filled 2020-06-01 (×11): qty 0.1

## 2020-06-01 MED ORDER — ATORVASTATIN CALCIUM 10 MG PO TABS
10.0000 mg | ORAL_TABLET | Freq: Every day | ORAL | Status: DC
  Administered 2020-06-02 – 2020-06-10 (×7): 10 mg via ORAL
  Filled 2020-06-01 (×8): qty 1

## 2020-06-01 NOTE — Plan of Care (Signed)
  Problem: Education: Goal: Knowledge of General Education information will improve Description: Including pain rating scale, medication(s)/side effects and non-pharmacologic comfort measures Outcome: Progressing   Problem: Health Behavior/Discharge Planning: Goal: Ability to manage health-related needs will improve Outcome: Progressing   Problem: Nutrition: Goal: Adequate nutrition will be maintained Outcome: Progressing   

## 2020-06-01 NOTE — ED Triage Notes (Signed)
EMS reports from home, called out for worsening diabetic pressure ulcer on bottom of left foot x 1 week. Being seen by wound care for same.  BP 128/80 HR 83 RR 16 Sp02 99 RA Temp 98

## 2020-06-01 NOTE — ED Provider Notes (Signed)
[facility] DEPT Provider Note   CSN: [id number] Arrival date & time: 06/01/20  7782     History Chief Complaint  Patient presents with  . Foot wound    Steven [name] is a 64 y.o. male.  64 year old male with past medical history of diabetes and renal transplant, presents with complaint of left foot wound.  Patient states he first noticed the wound about 1 month ago, tried applying topical antibiotic ointment to the area however when this was not improving he had a virtual visit and was given a prescription for Keflex.  Wound was not improving on Keflex so he went to urgent care where he was started on doxycycline.  Patient states that this time he feels like the wound has "come to a head with a pocket of pus" through the arch of his foot which is causing difficulty with walking.  Also reports wound to his right heel which has been there for several months, previously debrided by Dr. Sharol Given, he states this is not getting any worse but is not healing.  Patient reports max temp at home of 100.8, denies sustained fevers, has not taken antipyretics today.        Past Medical History:  Diagnosis Date  . Cataract    removed by surgery  . Diabetes ([place])    type 2  . 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 Corliss Parish.  . GERD (gastroesophageal reflux disease)   . HLD (hyperlipidemia)   . Hyperlipidemia   . Hyperparathyroidism ([place])   . Hypertension    Patient denies this DX, no meds  . Kidney transplant recipient   . Obesity   . Sleep apnea    uses CPAP nightly  . Venous insufficiency   . Vitamin D deficiency     Patient Active Problem List   Diagnosis Date Noted  . Open wound of left foot 06/01/2020  . Chronic pain 02/28/2020  . Vitamin D deficiency 02/25/2020  . Thrombocytopenia ([place]) 02/25/2020  . Normocytic anemia 02/25/2020  . Chronic venous hypertension (idiopathic) with ulcer and  inflammation of right lower extremity ([place])   . Non-pressure chronic ulcer of right heel and midfoot limited to breakdown of skin ([place])   . Diabetic polyneuropathy associated with type 2 diabetes mellitus ([place])   . Venous insufficiency   . Cellulitis of right foot 12/21/2019  . Obesity, Class III, BMI 40-49.9 (morbid obesity) ([place])   . Osteomyelitis ([place]) 07/17/2017  . Kidney transplant recipient 05/24/2015  . Diabetes mellitus ([place]) 05/10/2012  . Hyperlipidemia 05/10/2012  . Essential hypertension 05/10/2012    Past Surgical History:  Procedure Laterality Date  . AMPUTATION Right 07/19/2017   Procedure: AMPUTATION RIGHT 2ND TOE;  Surgeon: Leandrew Koyanagi, MD;  Location: [facility];  Service: Orthopedics;  Laterality: Right;  . COLONOSCOPY     greater than 10 yrs ago  . EYE SURGERY Bilateral    cataracts removed  . KIDNEY TRANSPLANT  2006  . NEPHRECTOMY TRANSPLANTED ORGAN  2006  . UPPER GASTROINTESTINAL ENDOSCOPY    . WISDOM TOOTH EXTRACTION         Family History  Problem Relation Age of Onset  . Healthy Mother   . Healthy Father   . Colon cancer Neg Hx   . Rectal cancer Neg Hx   . Stomach cancer Neg Hx     Social History   Tobacco Use  . Smoking status: Never Smoker  . Smokeless tobacco:  Former Systems developer    Types: Secondary school teacher  . Vaping Use: Never used  Substance Use Topics  . Alcohol use: No  . Drug use: No    Home Medications Prior to Admission medications   Medication Sig Start Date End Date Taking? Authorizing Provider  acetaminophen (TYLENOL) 500 MG tablet Take 500 mg by mouth every 6 (six) hours as needed for mild pain or fever.   Yes [provider]  atorvastatin (LIPITOR) 10 MG tablet Take 10 mg by mouth daily.  07/07/17  Yes [provider]  doxycycline (VIBRAMYCIN) 100 MG capsule Take 1 capsule (100 mg total) by mouth 2 (two) times daily. 05/29/20  Yes Yu, Amy V, PA-C  fenofibrate (TRICOR) 48 MG tablet Take 48 mg by mouth daily.   Yes  [provider]  furosemide (LASIX) 40 MG tablet Take 40 mg by mouth daily as needed for fluid (swelling).  07/06/17  Yes [provider]  glyBURIDE (DIABETA) 5 MG tablet Take 5 mg by mouth 2 (two) times daily with a meal.    Yes [provider]  insulin aspart (NOVOLOG) 100 UNIT/ML injection Inject 0-30 Units into the skin 3 (three) times daily as needed for high blood sugar. Sliding scale    Yes [provider]  insulin detemir (LEVEMIR) 100 UNIT/ML FlexPen Inject 10 Units into the skin daily. 01/30/20  Yes Nicolette Bang, DO  mycophenolate (MYFORTIC) 360 MG TBEC Take 360 mg by mouth 2 (two) times daily.   Yes [provider]  omeprazole (PRILOSEC) 20 MG capsule Take 1 capsule (20 mg total) by mouth daily. 01/30/20  Yes Nicolette Bang, DO  oxyCODONE (OXY IR/ROXICODONE) 5 MG immediate release tablet Take 1 tablet (5 mg total) by mouth every 4 (four) hours as needed for severe pain. 02/21/20  Yes Isaac Bliss, Rayford Halsted, MD  tacrolimus (PROGRAF) 1 MG capsule Take 1 mg by mouth 2 (two) times daily.    Yes [provider]  Vitamin D, Ergocalciferol, (DRISDOL) 1.25 MG (50000 UNIT) CAPS capsule TAKE 1 CAPSULE (50,000 UNITS TOTAL) BY MOUTH EVERY 7 (SEVEN) DAYS FOR 12 DOSES. Patient taking differently: Take 50,000 Units by mouth every 7 (seven) days. Wednesday 05/13/20 07/30/20 Yes Erline Hau, MD  cephALEXin (KEFLEX) 500 MG capsule Take 1 capsule (500 mg total) by mouth 4 (four) times daily. Patient not taking: Reported on 06/01/2020 05/24/20   Chevis Pretty, FNP  Insulin Pen Needle (BD PEN NEEDLE NANO 2ND GEN) 32G X 4 MM MISC Use daily for glucose control 03/11/20   Isaac Bliss, Rayford Halsted, MD  Insulin Pen Needle 32G X 4 MM MISC Use as directed with insulin pen 12/24/19   Geradine Girt, DO  ONETOUCH VERIO test strip 1 each by Other route 3 (three) times daily. 01/20/20   [provider]     Allergies    Sulfa antibiotics  Review of Systems   Review of Systems  Constitutional: Positive for fever.  Respiratory: Negative for shortness of breath.   Cardiovascular: Negative for chest pain.  Gastrointestinal: Negative for abdominal pain.  Musculoskeletal: Positive for arthralgias.  Skin: Positive for wound.  Allergic/Immunologic: Positive for immunocompromised state.  Neurological: Negative for weakness and numbness.  Psychiatric/Behavioral: Negative for confusion.  All other systems reviewed and are negative.   Physical Exam Updated Vital Signs BP 121/70 (BP Location: Left Arm)   Pulse 78   Temp 98.1 F (36.7 C) (Oral)   Resp 10  SpO2 100%   Physical Exam Vitals and nursing note reviewed.  Constitutional:      General: He is not in acute distress.    Appearance: He is well-developed. He is not diaphoretic.  HENT:     Head: Normocephalic and atraumatic.  Cardiovascular:     Rate and Rhythm: Normal rate and regular rhythm.     Pulses: Normal pulses.     Heart sounds: Normal heart sounds.  Pulmonary:     Effort: Pulmonary effort is normal.     Breath sounds: Normal breath sounds.  Musculoskeletal:        General: Swelling and tenderness present.     Right lower leg: No edema.     Left lower leg: No edema.  Skin:    General: Skin is warm and dry.     Findings: Erythema present.     Comments: Erythema to plantar surface of left foot with fluctuant area present, eschar noted to lateral left foot. Foul odor without active drainage. Ulcer to right heel, not painful, no surrounding erythema.   Neurological:     Mental Status: He is alert and oriented to person, place, and time.     Motor: No weakness.  Psychiatric:        Behavior: Behavior normal.             ED Results / Procedures / Treatments   Labs (all labs ordered are listed, but only abnormal results are displayed) Labs Reviewed  CBC WITH DIFFERENTIAL/PLATELET - Abnormal; Notable for  the following components:      Result Value   RBC 3.85 (*)    Hemoglobin 10.1 (*)    HCT 31.8 (*)    Platelets 146 (*)    Lymphs Abs 0.5 (*)    All other components within normal limits  COMPREHENSIVE METABOLIC PANEL - Abnormal; Notable for the following components:   Glucose, Bld 243 (*)    BUN 40 (*)    Creatinine, Ser 2.07 (*)    Albumin 3.3 (*)    GFR, Estimated 36 (*)    All other components within normal limits  RESPIRATORY PANEL BY RT PCR (FLU A&B, COVID)  CULTURE, BLOOD (ROUTINE X 2)  CULTURE, BLOOD (ROUTINE X 2)  LACTIC ACID, PLASMA  LACTIC ACID, PLASMA    EKG None  Radiology DG Foot Complete Left  Result Date: 06/01/2020 CLINICAL DATA:  Plantar foot wound. EXAM: LEFT FOOT - COMPLETE 3+ VIEW COMPARISON:  None. FINDINGS: Soft tissue wound along the lateral to plantar foot without evidence of underlying osteomyelitis. No adjacent soft tissue emphysema or opaque foreign body. Diffuse degenerative spurring, osteopenia, and arterial calcification IMPRESSION: Lateral soft tissue wound without evidence of osteomyelitis. No opaque foreign body. Electronically Signed   By: Monte Fantasia M.D.   On: 06/01/2020 11:32   DG Foot Complete Right  Result Date: 06/01/2020 CLINICAL DATA:  Diabetic wound infection EXAM: RIGHT FOOT COMPLETE - 3+ VIEW COMPARISON:  None. FINDINGS: Previous amputation of the second toe at the proximal aspect of the proximal phalanx level. No evidence of regional fracture or osteomyelitis. Degenerative changes present in the midfoot. Regional vascular and soft tissue calcifications are present. IMPRESSION: Previous amputation of the second toe at the proximal aspect of the proximal phalanx. No evidence of regional fracture or osteomyelitis. Electronically Signed   By: Nelson Chimes M.D.   On: 06/01/2020 11:32    Procedures Procedures (including critical care time)  Medications Ordered in ED Medications  sodium chloride 0.9 %  bolus 500 mL (has no  administration in time range)  piperacillin-tazobactam (ZOSYN) IVPB 3.375 g (0 g Intravenous Stopped 06/01/20 1237)    ED Course  I have reviewed the triage vital signs and the nursing notes.  Pertinent labs & imaging results that were available during my care of the patient were reviewed by me and considered in my medical decision making (see chart for details).  Clinical Course as of Jun 01 1318  Mon Nov 01, 289  3476 64 year old male with worsening left foot infection.  History of diabetes and renal transplant. On exam found erythema to the plantar surface of the left foot with fluctuance to plantar surface and eschar laterally. Suspect patient needs wound debridement in the OR, discussed with patient plan for admission. X-ray of bilateral feet negative for osteomyelitis.  Patient was started on Zosyn.  As soon as labs are available, will consult hospitalist for admission and Ortho for consult.   [LM]  1305 Consult with PA on-call for Dr. Sharol Given, Dr. Stark Jock will consult on this patient later today, requests hospitalist to admit.   [LM]  1316 Case discussed with Dr. Neysa Bonito with [facility] who will consult for admission.    [LM]    Clinical Course User Index [LM] Roque Lias   MDM Rules/Calculators/A&P                          Final Clinical Impression(s) / ED Diagnoses Final diagnoses:  Diabetic foot infection [facility])    Rx / DC Orders ED Discharge Orders    None       Tacy Learn, PA-C 06/01/20 1320    Dorie Rank, MD 06/02/20 [id number]

## 2020-06-01 NOTE — Plan of Care (Signed)
  Problem: Education: Goal: Knowledge of General Education information will improve Description: Including pain rating scale, medication(s)/side effects and non-pharmacologic comfort measures Outcome: Progressing   Problem: Activity: Goal: Risk for activity intolerance will decrease Outcome: Progressing   Problem: Nutrition: Goal: Adequate nutrition will be maintained Outcome: Progressing   

## 2020-06-01 NOTE — Consult Note (Addendum)
Renal Service Consult Note [facility]  Steven [name] 06/01/2020 Steven Blazing, MD Requesting Physician: Dr Neysa Bonito  Reason for Consult:  Transplant patient , help w/ medications HPI: The patient is a 64 y.o. year-old w/ hx of obeisty, OSA, HTN, HL, DM2 and ESRD , sp renal transplant 2006 .  Presented w/ worsening diabetic pressure ulcer on bottom of the left foot x 1 week.  F/b wound care clinic. Took keflex w/o imrpovement then was given doxy po. Then developed "pocket of pus" in the bottom of the foot. +temps to 100.8 at home. Seen by ortho/ Dr Sharol Given, considering treatment options.  Asked to see for transplant medications.   Old creat's from 2008 , 2018 and 11/2019 are mostly in the 1.81- 2.21 range. Pt states the transplant was done in [place] at [facility] in 2006, kidney donated by his sister, he was on prednisone "maybe for a short while", but has primarily been taking myfortic and prograf.  No recent medication changes.  F/b Dr [name] at [facility].     ROS  denies CP  no joint pain   no HA  no blurry vision  no rash  no diarrhea  no nausea/ vomiting  no dysuria  no difficulty voiding  no change in urine color    Past Medical History  Past Medical History:  Diagnosis Date  . Cataract    removed by surgery  . Diabetes ([place])    type 2  . Diabetic glomerulosclerosis [facility])    [facility] 03/21/12 by Dr. Corliss Parish.  . GERD (gastroesophageal reflux disease)   . HLD (hyperlipidemia)   . Hyperlipidemia   . Hyperparathyroidism ([place])   . Hypertension    Patient denies this DX, no meds  . Kidney transplant recipient   . Obesity   . Sleep apnea    uses CPAP nightly  . Venous insufficiency   . Vitamin D deficiency    Past Surgical History  Past Surgical History:  Procedure Laterality Date  . AMPUTATION Right 07/19/2017   Procedure: AMPUTATION RIGHT 2ND TOE;  Surgeon: Leandrew Koyanagi, MD;  Location: [facility];  Service: Orthopedics;   Laterality: Right;  . COLONOSCOPY     greater than 10 yrs ago  . EYE SURGERY Bilateral    cataracts removed  . KIDNEY TRANSPLANT  2006  . NEPHRECTOMY TRANSPLANTED ORGAN  2006  . UPPER GASTROINTESTINAL ENDOSCOPY    . WISDOM TOOTH EXTRACTION     Family History  Family History  Problem Relation Age of Onset  . Healthy Mother   . Healthy Father   . Colon cancer Neg Hx   . Rectal cancer Neg Hx   . Stomach cancer Neg Hx    Social History  reports that he has never smoked. He quit smokeless tobacco use about 22 months ago.  His smokeless tobacco use included chew. He reports that he does not drink alcohol and does not use drugs. Allergies  Allergies  Allergen Reactions  . Sulfa Antibiotics Rash   Home medications Prior to Admission medications   Medication Sig Start Date End Date Taking? Authorizing Provider  acetaminophen (TYLENOL) 500 MG tablet Take 500 mg by mouth every 6 (six) hours as needed for mild pain or fever.   Yes [provider]  atorvastatin (LIPITOR) 10 MG tablet Take 10 mg by mouth daily.  07/07/17  Yes [provider]  doxycycline (VIBRAMYCIN) 100 MG capsule Take 1 capsule (100 mg total) by mouth 2 (two)  times daily. 05/29/20  Yes Yu, Amy V, PA-C  fenofibrate (TRICOR) 48 MG tablet Take 48 mg by mouth daily.   Yes [provider]  furosemide (LASIX) 40 MG tablet Take 40 mg by mouth daily as needed for fluid (swelling).  07/06/17  Yes [provider]  glyBURIDE (DIABETA) 5 MG tablet Take 5 mg by mouth 2 (two) times daily with a meal.    Yes [provider]  insulin aspart (NOVOLOG) 100 UNIT/ML injection Inject 0-30 Units into the skin 3 (three) times daily as needed for high blood sugar. Sliding scale    Yes [provider]  insulin detemir (LEVEMIR) 100 UNIT/ML FlexPen Inject 10 Units into the skin daily. 01/30/20  Yes Nicolette Bang, DO  mycophenolate (MYFORTIC) 360 MG TBEC Take 360 mg by mouth 2 (two) times  daily.   Yes [provider]  omeprazole (PRILOSEC) 20 MG capsule Take 1 capsule (20 mg total) by mouth daily. 01/30/20  Yes Nicolette Bang, DO  oxyCODONE (OXY IR/ROXICODONE) 5 MG immediate release tablet Take 1 tablet (5 mg total) by mouth every 4 (four) hours as needed for severe pain. 02/21/20  Yes Isaac Bliss, Rayford Halsted, MD  tacrolimus (PROGRAF) 1 MG capsule Take 1 mg by mouth 2 (two) times daily.    Yes [provider]  Vitamin D, Ergocalciferol, (DRISDOL) 1.25 MG (50000 UNIT) CAPS capsule TAKE 1 CAPSULE (50,000 UNITS TOTAL) BY MOUTH EVERY 7 (SEVEN) DAYS FOR 12 DOSES. Patient taking differently: Take 50,000 Units by mouth every 7 (seven) days. Wednesday 05/13/20 07/30/20 Yes Erline Hau, MD  cephALEXin (KEFLEX) 500 MG capsule Take 1 capsule (500 mg total) by mouth 4 (four) times daily. Patient not taking: Reported on 06/01/2020 05/24/20   Chevis Pretty, FNP  Insulin Pen Needle (BD PEN NEEDLE NANO 2ND GEN) 32G X 4 MM MISC Use daily for glucose control 03/11/20   Isaac Bliss, Rayford Halsted, MD  Insulin Pen Needle 32G X 4 MM MISC Use as directed with insulin pen 12/24/19   Geradine Girt, DO  ONETOUCH VERIO test strip 1 each by Other route 3 (three) times daily. 01/20/20   [provider]     Vitals:   06/01/20 1300 06/01/20 1345 06/01/20 1352 06/01/20 1420  BP: (!) 130/56  (!) 147/71 (!) 141/75  Pulse: 81 75 79 78  Resp: 14 19 17 18   Temp:   99 F (37.2 C) 98.2 F (36.8 C)  TempSrc:   Oral Oral  SpO2: 100% 99% 100% 100%  Weight:    111.5 kg  Height:    5\' 11"  (1.803 m)   Exam Gen alert, no distress No rash, cyanosis or gangrene Sclera anicteric, throat clear  No jvd or bruits Chest clear bilat to bases RRR no MRG Abd soft ntnd no mass or ascites +bs obese GU normal male MS no joint effusions or deformity, R foot toe amp Ext trace pretib edema, L plantar erythema / swelling Neuro is alert, Ox 3 , nf    Home meds:   - lipitor/ tricor/ lasix 40 prn   - doxycycline/ keflex qid  - insulin detemir 10/ novolog 0-30 tid/ glyburide 5 bid  - mycophenolate 360 bid/ tacrolimus 1mg  bid  - oxy IR prn/ prilosec 20 qd  - prn's/ vitamins/ supplements    Na 137  K 4.6  CO2 23  BUN 40   Cr 2.07  Ca 8.9  Alb 3.3  LFT's ok  WBC 6K Hb 10  plt 146   Old creat's from 2008 , 2018 and 11/2019 are mostly in the 1.81- 2.21 range.     Assessment/ Plan: 1. CKD 3b / sp renal transplant - stable renal fxn compared to earlier this year. Continue po meds , have ordered his prograf and myfortic at home dosing. No steroid. No other suggestions. If need further assistance please call.  2. L foot abscess/ hx PAD R toe amp - per ortho/ primary team, on IV abx, plan for possible amputation 3. DM2 on insulin 4. BP/ volume - takes lasix prn, no sig vol overload, not on any BP lowering meds at home.       Kelly Splinter  MD 06/01/2020, 5:07 PM  Recent Labs  Lab 06/01/20 1147  WBC 6.4  HGB 10.1*   Recent Labs  Lab 06/01/20 1147  K 4.6  BUN 40*  CREATININE 2.07*  CALCIUM 8.9

## 2020-06-01 NOTE — H&P (Signed)
History and Physical        Hospital Admission Note Date: 06/01/2020  Patient name: Steven [name] Medical record number: [id number] Date of birth: [birth year] Age: 64 y.o. Gender: male  PCP: Isaac Bliss, Rayford Halsted, MD  Patient coming from: Home   Chief Complaint    Chief Complaint  Patient presents with  . Foot wound      HPI:   This is a very pleasant 64 year old male with a past medical history of obesity, OSA, hypertension, hyperlipidemia, type 2 diabetes ESRD s/p renal transplant in 2006 on immunosuppressants presented to the ED a worsening diabetic pressure ulcer on the bottom of his left foot 1 to 2 weeks.  He initially took Keflex without improvement and was switched to Doxy and developed at the bottom of the left foot.  Also with a right foot pressure ulcer which is poorly healing.  Has had low-grade temps at home.  Without any chest pain, nausea, vomiting, diarrhea or constipation.  No other complaints and no significant complaints.    ED Course: Afebrile and hemodynamically stable on room air.  Labs with hyperglycemia and creatinine 2.07 which is about baseline and otherwise overall unremarkable.  He was given Zosyn and 500 cc NS bolus.  Vitals:   06/01/20 1352 06/01/20 1420  BP: (!) 147/71 (!) 141/75  Pulse: 79 78  Resp: 17 18  Temp: 99 F (37.2 C) 98.2 F (36.8 C)  SpO2: 100% 100%     Review of Systems:  Review of Systems  All other systems reviewed and are negative.   Medical/Social/Family History   Past Medical History: Past Medical History:  Diagnosis Date  . Cataract    removed by surgery  . Diabetes ([place])    type 2  . Diabetic glomerulosclerosis [facility])    [facility] 03/21/12 by Dr. Corliss Parish.  . GERD (gastroesophageal reflux disease)   . HLD (hyperlipidemia)   . Hyperlipidemia   . Hyperparathyroidism ([place])    . Hypertension    Patient denies this DX, no meds  . Kidney transplant recipient   . Obesity   . Sleep apnea    uses CPAP nightly  . Venous insufficiency   . Vitamin D deficiency     Past Surgical History:  Procedure Laterality Date  . AMPUTATION Right 07/19/2017   Procedure: AMPUTATION RIGHT 2ND TOE;  Surgeon: Leandrew Koyanagi, MD;  Location: [place];  Service: Orthopedics;  Laterality: Right;  . COLONOSCOPY     greater than 10 yrs ago  . EYE SURGERY Bilateral    cataracts removed  . KIDNEY TRANSPLANT  2006  . NEPHRECTOMY TRANSPLANTED ORGAN  2006  . UPPER GASTROINTESTINAL ENDOSCOPY    . WISDOM TOOTH EXTRACTION      Medications: Prior to Admission medications   Medication Sig Start Date End Date Taking? Authorizing Provider  acetaminophen (TYLENOL) 500 MG tablet Take 500 mg by mouth every 6 (six) hours as needed for mild pain or fever.   Yes [provider]  atorvastatin (LIPITOR) 10 MG tablet Take 10 mg by mouth daily.  07/07/17  Yes [provider]  doxycycline (VIBRAMYCIN) 100 MG capsule Take 1 capsule (100 mg total) by mouth 2 (  two) times daily. 05/29/20  Yes Yu, Amy V, PA-C  fenofibrate (TRICOR) 48 MG tablet Take 48 mg by mouth daily.   Yes [provider]  furosemide (LASIX) 40 MG tablet Take 40 mg by mouth daily as needed for fluid (swelling).  07/06/17  Yes [provider]  glyBURIDE (DIABETA) 5 MG tablet Take 5 mg by mouth 2 (two) times daily with a meal.    Yes [provider]  insulin aspart (NOVOLOG) 100 UNIT/ML injection Inject 0-30 Units into the skin 3 (three) times daily as needed for high blood sugar. Sliding scale    Yes [provider]  insulin detemir (LEVEMIR) 100 UNIT/ML FlexPen Inject 10 Units into the skin daily. 01/30/20  Yes Nicolette Bang, DO  mycophenolate (MYFORTIC) 360 MG TBEC Take 360 mg by mouth 2 (two) times daily.   Yes [provider]  omeprazole (PRILOSEC) 20 MG capsule Take 1  capsule (20 mg total) by mouth daily. 01/30/20  Yes Nicolette Bang, DO  oxyCODONE (OXY IR/ROXICODONE) 5 MG immediate release tablet Take 1 tablet (5 mg total) by mouth every 4 (four) hours as needed for severe pain. 02/21/20  Yes Isaac Bliss, Rayford Halsted, MD  tacrolimus (PROGRAF) 1 MG capsule Take 1 mg by mouth 2 (two) times daily.    Yes [provider]  Vitamin D, Ergocalciferol, (DRISDOL) 1.25 MG (50000 UNIT) CAPS capsule TAKE 1 CAPSULE (50,000 UNITS TOTAL) BY MOUTH EVERY 7 (SEVEN) DAYS FOR 12 DOSES. Patient taking differently: Take 50,000 Units by mouth every 7 (seven) days. Wednesday 05/13/20 07/30/20 Yes Erline Hau, MD  cephALEXin (KEFLEX) 500 MG capsule Take 1 capsule (500 mg total) by mouth 4 (four) times daily. Patient not taking: Reported on 06/01/2020 05/24/20   Chevis Pretty, FNP  Insulin Pen Needle (BD PEN NEEDLE NANO 2ND GEN) 32G X 4 MM MISC Use daily for glucose control 03/11/20   Isaac Bliss, Rayford Halsted, MD  Insulin Pen Needle 32G X 4 MM MISC Use as directed with insulin pen 12/24/19   Geradine Girt, DO  ONETOUCH VERIO test strip 1 each by Other route 3 (three) times daily. 01/20/20   [provider]    Allergies:   Allergies  Allergen Reactions  . Sulfa Antibiotics Rash    Social History:  reports that he has never smoked. He quit smokeless tobacco use about 22 months ago.  His smokeless tobacco use included chew. He reports that he does not drink alcohol and does not use drugs.  Family History: Family History  Problem Relation Age of Onset  . Healthy Mother   . Healthy Father   . Colon cancer Neg Hx   . Rectal cancer Neg Hx   . Stomach cancer Neg Hx      Objective   Physical Exam: Blood pressure (!) 141/75, pulse 78, temperature 98.2 F (36.8 C), temperature source Oral, resp. rate 18, height 5\' 11"  (1.803 m), weight 111.5 kg, SpO2 100 %.  Physical Exam Vitals and nursing note reviewed.  Constitutional:        Appearance: Normal appearance.  HENT:     Head: Normocephalic and atraumatic.  Eyes:     Conjunctiva/sclera: Conjunctivae normal.  Cardiovascular:     Rate and Rhythm: Normal rate and regular rhythm.  Pulmonary:     Effort: Pulmonary effort is normal.     Breath sounds: Normal breath sounds.  Abdominal:     General: Abdomen is flat.     Palpations:  Abdomen is soft.  Musculoskeletal:     Comments:  S/p right second lower extremity digit amputation  right heel with nonhealing pressure ulcer Left heel with purulent abscess and wound that probes to bone per Dr. Sharol Given at bedside   Skin:    Coloration: Skin is not jaundiced or pale.  Neurological:     Mental Status: He is alert. Mental status is at baseline.  Psychiatric:        Mood and Affect: Mood normal.        Behavior: Behavior normal.   Pictures obtained from ED provider as below        LABS on Admission: I have personally reviewed all the labs and imaging below    Basic Metabolic Panel: Recent Labs  Lab 06/01/20 1147  NA 137  K 4.6  CL 99  CO2 23  GLUCOSE 243*  BUN 40*  CREATININE 2.07*  CALCIUM 8.9   Liver Function Tests: Recent Labs  Lab 06/01/20 1147  AST 18  ALT 27  ALKPHOS 116  BILITOT 1.2  PROT 6.9  ALBUMIN 3.3*   No results for input(s): LIPASE, AMYLASE in the last 168 hours. No results for input(s): AMMONIA in the last 168 hours. CBC: Recent Labs  Lab 06/01/20 1147  WBC 6.4  NEUTROABS 5.3  HGB 10.1*  HCT 31.8*  MCV 82.6  PLT 146*   Cardiac Enzymes: No results for input(s): CKTOTAL, CKMB, CKMBINDEX, TROPONINI in the last 168 hours. BNP: Invalid input(s): POCBNP CBG: Recent Labs  Lab 06/01/20 1730  GLUCAP 232*    Radiological Exams on Admission:  DG Foot Complete Left  Result Date: 06/01/2020 CLINICAL DATA:  Plantar foot wound. EXAM: LEFT FOOT - COMPLETE 3+ VIEW COMPARISON:  None. FINDINGS: Soft tissue wound along the lateral to plantar foot without evidence of  underlying osteomyelitis. No adjacent soft tissue emphysema or opaque foreign body. Diffuse degenerative spurring, osteopenia, and arterial calcification IMPRESSION: Lateral soft tissue wound without evidence of osteomyelitis. No opaque foreign body. Electronically Signed   By: Monte Fantasia M.D.   On: 06/01/2020 11:32   DG Foot Complete Right  Result Date: 06/01/2020 CLINICAL DATA:  Diabetic wound infection EXAM: RIGHT FOOT COMPLETE - 3+ VIEW COMPARISON:  None. FINDINGS: Previous amputation of the second toe at the proximal aspect of the proximal phalanx level. No evidence of regional fracture or osteomyelitis. Degenerative changes present in the midfoot. Regional vascular and soft tissue calcifications are present. IMPRESSION: Previous amputation of the second toe at the proximal aspect of the proximal phalanx. No evidence of regional fracture or osteomyelitis. Electronically Signed   By: Nelson Chimes M.D.   On: 06/01/2020 11:32      EKG: Independently reviewed.    A & P   Principal Problem:   Osteomyelitis of foot (HCC) Active Problems:   Diabetes mellitus ([place])   Hyperlipidemia   Kidney transplant recipient   Open wound of left foot   Diabetic foot infection ([place])   CKD (chronic kidney disease), stage III ([place])   1. Osteomyelitis of the left foot with cellulitis, failed outpatient antibiotics a. Afebrile and hemodynamically stable on room air without leukocytosis but worsening infection b. Change Zosyn to vancomycin for MRSA coverage in immunosuppressed patient c. Blood cultures d. Transfer to [facility], plan for surgery on Wednesday per Ortho e. MRI left foot  2. Nonhealing bilateral lower extremity diabetic foot wounds a. Lower extremity wound order set b. Bilateral foot x-rays without osteomyelitis c. WOCN consult  3. CKD  3b s/p renal transplant on immunosuppressants a. Nephrology consulted to assist with immunosuppressants  4. Type 2 diabetes a. Continue home Levemir 10  units daily and sliding scale  5. Hyperlipidemia a. Continue home statin and fenofibrate   DVT prophylaxis: Heparin   Code Status: Full Code  Diet: Heart healthy carb modified Family Communication: Admission, patients condition and plan of care including tests being ordered have been discussed with the patient who indicates understanding and agrees with the plan and Code Status. [name] mother was updated  Disposition Plan: The appropriate patient status for this patient is INPATIENT. Inpatient status is judged to be reasonable and necessary in order to provide the required intensity of service to ensure the patient's safety. The patient's presenting symptoms, physical exam findings, and initial radiographic and laboratory data in the context of their chronic comorbidities is felt to place them at high risk for further clinical deterioration. Furthermore, it is not anticipated that the patient will be medically stable for discharge from the hospital within 2 midnights of admission. The following factors support the patient status of inpatient.   " The patient's presenting symptoms include worsening left foot diabetic wound. " The worrisome physical exam findings include purulent wound with surrounding erythema. " The initial radiographic and laboratory data are worrisome because of unremarkable. " The chronic co-morbidities include diabetes, renal transplant.   * I certify that at the point of admission it is my clinical judgment that the patient will require inpatient hospital care spanning beyond 2 midnights from the point of admission due to high intensity of service, high risk for further deterioration and high frequency of surveillance required.*   Status is: Inpatient  Remains inpatient appropriate because:Ongoing diagnostic testing needed not appropriate for outpatient work up, IV treatments appropriate due to intensity of illness or inability to take PO and Inpatient level of care  appropriate due to severity of illness   Dispo: The patient is from: Home              Anticipated d/c is to: SNF              Anticipated d/c date is: > 3 days              Patient currently is not medically stable to d/c.         The medical decision making on this patient was of high complexity and the patient is at high risk for clinical deterioration, therefore this is a level 3  admission.  Consultants  . Orthopedic surgery . Nephrology .   Procedures  . None  Time Spent on Admission: 70 minutes    Harold Hedge, DO [facility]  06/01/2020, 6:11 PM

## 2020-06-01 NOTE — H&P (View-Only) (Signed)
ORTHOPAEDIC CONSULTATION  REQUESTING PHYSICIAN: Harold Hedge, MD  Chief Complaint: Gangrene, osteomyelitis, and abscess left foot  HPI: Steven [name] is a 64 y.o. male who presents with a history of type 2 diabetes status post kidney transplant who uses CPAP with uncontrolled type 2 diabetes.  Patient presents with a necrotic ulcer over the lateral aspect of the left foot.  Patient states he is unable to weight-bear on the foot.  He states he has noticed the cellulitis and ulcerative tissue extend across the plantar aspect of his midfoot.  He is status post a right second toe amputation.  He has had a superficial ulcer on the right heel.  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  Past Medical History:  Diagnosis Date  . Cataract    removed by surgery  . Diabetes ([place])    type 2  . Diabetic glomerulosclerosis [facility])    [facility] 03/21/12 by Dr. Corliss Parish.  . GERD (gastroesophageal reflux disease)   . HLD (hyperlipidemia)   . Hyperlipidemia   . Hyperparathyroidism ([place])   . Hypertension    Patient denies this DX, no meds  . Kidney transplant recipient   . Obesity   . Sleep apnea    uses CPAP nightly  . Venous insufficiency   . Vitamin D deficiency    Past Surgical History:  Procedure Laterality Date  . AMPUTATION Right 07/19/2017   Procedure: AMPUTATION RIGHT 2ND TOE;  Surgeon: Leandrew Koyanagi, MD;  Location: [place];  Service: Orthopedics;  Laterality: Right;  . COLONOSCOPY     greater than 10 yrs ago  . EYE SURGERY Bilateral    cataracts removed  . KIDNEY TRANSPLANT  2006  . NEPHRECTOMY TRANSPLANTED ORGAN  2006  . UPPER GASTROINTESTINAL ENDOSCOPY    . WISDOM TOOTH EXTRACTION     Social History   Socioeconomic History  . Marital status: Legally Separated    Spouse name: Not on file  . Number of children: Not on file  . Years of education: Not on file  . Highest education level: Not on file    Occupational History  . Occupation: trooper  Tobacco Use  . Smoking status: Never Smoker  . Smokeless tobacco: Former Systems developer    Types: Secondary school teacher  . Vaping Use: Never used  Substance and Sexual Activity  . Alcohol use: No  . Drug use: No  . Sexual activity: Yes  Other Topics Concern  . Not on file  Social History Narrative  . Not on file   Social Determinants of Health   Financial Resource Strain:   . Difficulty of Paying Living Expenses: Not on file  Food Insecurity:   . Worried About Charity fundraiser in the Last Year: Not on file  . Ran Out of Food in the Last Year: Not on file  Transportation Needs:   . Lack of Transportation (Medical): Not on file  . Lack of Transportation (Non-Medical): Not on file  Physical Activity:   . Days of Exercise per Week: Not on file  . Minutes of Exercise per Session: Not on file  Stress:   . Feeling of Stress : Not on file  Social Connections:   . Frequency of Communication with Friends and Family: Not on file  . Frequency of Social Gatherings with Friends and Family: Not on file  . Attends Religious Services: Not on file  . Active Member of Clubs or  Organizations: Not on file  . Attends Archivist Meetings: Not on file  . Marital Status: Not on file   Family History  Problem Relation Age of Onset  . Healthy Mother   . Healthy Father   . Colon cancer Neg Hx   . Rectal cancer Neg Hx   . Stomach cancer Neg Hx    - negative except otherwise stated in the family history section Allergies  Allergen Reactions  . Sulfa Antibiotics Rash   Prior to Admission medications   Medication Sig Start Date End Date Taking? Authorizing Provider  acetaminophen (TYLENOL) 500 MG tablet Take 500 mg by mouth every 6 (six) hours as needed for mild pain or fever.   Yes [provider]  atorvastatin (LIPITOR) 10 MG tablet Take 10 mg by mouth daily.  07/07/17  Yes [provider]  doxycycline (VIBRAMYCIN) 100 MG  capsule Take 1 capsule (100 mg total) by mouth 2 (two) times daily. 05/29/20  Yes Yu, Amy V, PA-C  fenofibrate (TRICOR) 48 MG tablet Take 48 mg by mouth daily.   Yes [provider]  furosemide (LASIX) 40 MG tablet Take 40 mg by mouth daily as needed for fluid (swelling).  07/06/17  Yes [provider]  glyBURIDE (DIABETA) 5 MG tablet Take 5 mg by mouth 2 (two) times daily with a meal.    Yes [provider]  insulin aspart (NOVOLOG) 100 UNIT/ML injection Inject 0-30 Units into the skin 3 (three) times daily as needed for high blood sugar. Sliding scale    Yes [provider]  insulin detemir (LEVEMIR) 100 UNIT/ML FlexPen Inject 10 Units into the skin daily. 01/30/20  Yes Nicolette Bang, DO  mycophenolate (MYFORTIC) 360 MG TBEC Take 360 mg by mouth 2 (two) times daily.   Yes [provider]  omeprazole (PRILOSEC) 20 MG capsule Take 1 capsule (20 mg total) by mouth daily. 01/30/20  Yes Nicolette Bang, DO  oxyCODONE (OXY IR/ROXICODONE) 5 MG immediate release tablet Take 1 tablet (5 mg total) by mouth every 4 (four) hours as needed for severe pain. 02/21/20  Yes Isaac Bliss, Rayford Halsted, MD  tacrolimus (PROGRAF) 1 MG capsule Take 1 mg by mouth 2 (two) times daily.    Yes [provider]  Vitamin D, Ergocalciferol, (DRISDOL) 1.25 MG (50000 UNIT) CAPS capsule TAKE 1 CAPSULE (50,000 UNITS TOTAL) BY MOUTH EVERY 7 (SEVEN) DAYS FOR 12 DOSES. Patient taking differently: Take 50,000 Units by mouth every 7 (seven) days. Wednesday 05/13/20 07/30/20 Yes Erline Hau, MD  cephALEXin (KEFLEX) 500 MG capsule Take 1 capsule (500 mg total) by mouth 4 (four) times daily. Patient not taking: Reported on 06/01/2020 05/24/20   Chevis Pretty, FNP  Insulin Pen Needle (BD PEN NEEDLE NANO 2ND GEN) 32G X 4 MM MISC Use daily for glucose control 03/11/20   Isaac Bliss, Rayford Halsted, MD  Insulin Pen Needle 32G X 4 MM MISC Use as directed  with insulin pen 12/24/19   Geradine Girt, DO  ONETOUCH VERIO test strip 1 each by Other route 3 (three) times daily. 01/20/20   [provider]   DG Foot Complete Left  Result Date: 06/01/2020 CLINICAL DATA:  Plantar foot wound. EXAM: LEFT FOOT - COMPLETE 3+ VIEW COMPARISON:  None. FINDINGS: Soft tissue wound along the lateral to plantar foot without evidence of underlying osteomyelitis. No adjacent soft tissue emphysema or opaque foreign body. Diffuse degenerative spurring, osteopenia, and arterial calcification IMPRESSION: Lateral soft  tissue wound without evidence of osteomyelitis. No opaque foreign body. Electronically Signed   By: Monte Fantasia M.D.   On: 06/01/2020 11:32   DG Foot Complete Right  Result Date: 06/01/2020 CLINICAL DATA:  Diabetic wound infection EXAM: RIGHT FOOT COMPLETE - 3+ VIEW COMPARISON:  None. FINDINGS: Previous amputation of the second toe at the proximal aspect of the proximal phalanx level. No evidence of regional fracture or osteomyelitis. Degenerative changes present in the midfoot. Regional vascular and soft tissue calcifications are present. IMPRESSION: Previous amputation of the second toe at the proximal aspect of the proximal phalanx. No evidence of regional fracture or osteomyelitis. Electronically Signed   By: Nelson Chimes M.D.   On: 06/01/2020 11:32   - pertinent xrays, CT, MRI studies were reviewed and independently interpreted  Positive ROS: All other systems have been reviewed and were otherwise negative with the exception of those mentioned in the HPI and as above.  Physical Exam: General: Alert, no acute distress Psychiatric: Patient is competent for consent with normal mood and affect Lymphatic: No axillary or cervical lymphadenopathy Cardiovascular: No pedal edema Respiratory: No cyanosis, no use of accessory musculature GI: No organomegaly, abdomen is soft and non-tender    Images:  @ENCIMAGES @  Labs:  Lab Results  Component  Value Date   HGBA1C 9.4 (H) 12/21/2019   HGBA1C 9.3 (H) 07/17/2017   HGBA1C  07/11/2007    5.9 (NOTE)  Therapeutic target for the treatment of diabetes mellitus patients is <7% HbA1c.  American Diabetes Association Diabetes Care 2002;25:S33-S49.   ESRSEDRATE 40 (H) 07/17/2017   CRP 11.1 (H) 07/17/2017   REPTSTATUS 12/26/2019 FINAL 12/21/2019   CULT  12/21/2019    NO GROWTH 5 DAYS Performed at [facility], [place] [address], [place], [place] [address]     Lab Results  Component Value Date   ALBUMIN 3.3 (L) 06/01/2020   ALBUMIN 3.1 (L) 12/22/2019   ALBUMIN 3.0 (L) 07/18/2017   PREALBUMIN 16.1 (L) 07/17/2017    Neurologic: Patient does not have protective sensation bilateral lower extremities.   MUSCULOSKELETAL:   Skin: Examination there is a necrotic ulcer over the base of the fifth metatarsal left foot this probes to bone there is purulent drainage there is cellulitis extends across the plantar aspect of the midfoot with tenderness cellulitis and tenderness to palpation.  Patient has a superficial Wagner grade 1 ulcer of the right heel.  [name] radiograph shows calcified vessels throughout the foot.  The radiographs do show periosteal reaction at the base of the fifth metatarsal left foot.  Previous ankle-brachial indices in 2018 showed noncompressible vessels.  Patient has brawny edema in both lower extremities with pitting edema from venous insufficiency but no venous ulcers.  Patient does not have palpable pulses.  Most recent albumin 3.3 and most recent hemoglobin A1c 9.4.  Assessment: Assessment: Abscess osteomyelitis and ulceration left midfoot.  Plan: Plan: Discussed that we could attempt foot salvage intervention that would require several operations and may not provide patient a stable foot for ambulation.  Discussed that we could also proceed with a transtibial amputation that would provide him the best function and had the best chance to heal.  Patient states  that he would like to proceed with a transtibial amputation but will discuss this with his family if there are more questions.  Will have patient transferred to [facility] with plain surgery on Wednesday.  Thank you for the consult and the opportunity to see Mr. Kayin Osment, MD  [employer] [phone] 4:26 PM

## 2020-06-01 NOTE — Consult Note (Signed)
ORTHOPAEDIC CONSULTATION  REQUESTING PHYSICIAN: Harold Hedge, MD  Chief Complaint: Gangrene, osteomyelitis, and abscess left foot  HPI: Steven [name] is a 65 y.o. male who presents with a history of type 2 diabetes status post kidney transplant who uses CPAP with uncontrolled type 2 diabetes.  Patient presents with a necrotic ulcer over the lateral aspect of the left foot.  Patient states he is unable to weight-bear on the foot.  He states he has noticed the cellulitis and ulcerative tissue extend across the plantar aspect of his midfoot.  He is status post a right second toe amputation.  He has had a superficial ulcer on the right heel.  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  Past Medical History:  Diagnosis Date  . Cataract    removed by surgery  . Diabetes ([place])    type 2  . 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 Corliss Parish.  . GERD (gastroesophageal reflux disease)   . HLD (hyperlipidemia)   . Hyperlipidemia   . Hyperparathyroidism ([place])   . Hypertension    Patient denies this DX, no meds  . Kidney transplant recipient   . Obesity   . Sleep apnea    uses CPAP nightly  . Venous insufficiency   . Vitamin D deficiency    Past Surgical History:  Procedure Laterality Date  . AMPUTATION Right 07/19/2017   Procedure: AMPUTATION RIGHT 2ND TOE;  Surgeon: Leandrew Koyanagi, MD;  Location: [facility];  Service: Orthopedics;  Laterality: Right;  . COLONOSCOPY     greater than 10 yrs ago  . EYE SURGERY Bilateral    cataracts removed  . KIDNEY TRANSPLANT  2006  . NEPHRECTOMY TRANSPLANTED ORGAN  2006  . UPPER GASTROINTESTINAL ENDOSCOPY    . WISDOM TOOTH EXTRACTION     Social History   Socioeconomic History  . Marital status: Legally Separated    Spouse name: Not on file  . Number of children: Not on file  . Years of education: Not on file  . Highest education level: Not on file    Occupational History  . Occupation: trooper  Tobacco Use  . Smoking status: Never Smoker  . Smokeless tobacco: Former Systems developer    Types: Secondary school teacher  . Vaping Use: Never used  Substance and Sexual Activity  . Alcohol use: No  . Drug use: No  . Sexual activity: Yes  Other Topics Concern  . Not on file  Social History Narrative  . Not on file   Social Determinants of Health   Financial Resource Strain:   . Difficulty of Paying Living Expenses: Not on file  Food Insecurity:   . Worried About Charity fundraiser in the Last Year: Not on file  . Ran Out of Food in the Last Year: Not on file  Transportation Needs:   . Lack of Transportation (Medical): Not on file  . Lack of Transportation (Non-Medical): Not on file  Physical Activity:   . Days of Exercise per Week: Not on file  . Minutes of Exercise per Session: Not on file  Stress:   . Feeling of Stress : Not on file  Social Connections:   . Frequency of Communication with Friends and Family: Not on file  . Frequency of Social Gatherings with Friends and Family: Not on file  . Attends Religious Services: Not on file  . Active Member of Clubs or  Organizations: Not on file  . Attends Archivist Meetings: Not on file  . Marital Status: Not on file   Family History  Problem Relation Age of Onset  . Healthy Mother   . Healthy Father   . Colon cancer Neg Hx   . Rectal cancer Neg Hx   . Stomach cancer Neg Hx    - negative except otherwise stated in the family history section Allergies  Allergen Reactions  . Sulfa Antibiotics Rash   Prior to Admission medications   Medication Sig Start Date End Date Taking? Authorizing Provider  acetaminophen (TYLENOL) 500 MG tablet Take 500 mg by mouth every 6 (six) hours as needed for mild pain or fever.   Yes [provider]  atorvastatin (LIPITOR) 10 MG tablet Take 10 mg by mouth daily.  07/07/17  Yes [provider]  doxycycline (VIBRAMYCIN) 100 MG  capsule Take 1 capsule (100 mg total) by mouth 2 (two) times daily. 05/29/20  Yes Yu, Amy V, PA-C  fenofibrate (TRICOR) 48 MG tablet Take 48 mg by mouth daily.   Yes [provider]  furosemide (LASIX) 40 MG tablet Take 40 mg by mouth daily as needed for fluid (swelling).  07/06/17  Yes [provider]  glyBURIDE (DIABETA) 5 MG tablet Take 5 mg by mouth 2 (two) times daily with a meal.    Yes [provider]  insulin aspart (NOVOLOG) 100 UNIT/ML injection Inject 0-30 Units into the skin 3 (three) times daily as needed for high blood sugar. Sliding scale    Yes [provider]  insulin detemir (LEVEMIR) 100 UNIT/ML FlexPen Inject 10 Units into the skin daily. 01/30/20  Yes Nicolette Bang, DO  mycophenolate (MYFORTIC) 360 MG TBEC Take 360 mg by mouth 2 (two) times daily.   Yes [provider]  omeprazole (PRILOSEC) 20 MG capsule Take 1 capsule (20 mg total) by mouth daily. 01/30/20  Yes Nicolette Bang, DO  oxyCODONE (OXY IR/ROXICODONE) 5 MG immediate release tablet Take 1 tablet (5 mg total) by mouth every 4 (four) hours as needed for severe pain. 02/21/20  Yes Isaac Bliss, Rayford Halsted, MD  tacrolimus (PROGRAF) 1 MG capsule Take 1 mg by mouth 2 (two) times daily.    Yes [provider]  Vitamin D, Ergocalciferol, (DRISDOL) 1.25 MG (50000 UNIT) CAPS capsule TAKE 1 CAPSULE (50,000 UNITS TOTAL) BY MOUTH EVERY 7 (SEVEN) DAYS FOR 12 DOSES. Patient taking differently: Take 50,000 Units by mouth every 7 (seven) days. Wednesday 05/13/20 07/30/20 Yes Erline Hau, MD  cephALEXin (KEFLEX) 500 MG capsule Take 1 capsule (500 mg total) by mouth 4 (four) times daily. Patient not taking: Reported on 06/01/2020 05/24/20   Chevis Pretty, FNP  Insulin Pen Needle (BD PEN NEEDLE NANO 2ND GEN) 32G X 4 MM MISC Use daily for glucose control 03/11/20   Isaac Bliss, Rayford Halsted, MD  Insulin Pen Needle 32G X 4 MM MISC Use as directed  with insulin pen 12/24/19   Geradine Girt, DO  ONETOUCH VERIO test strip 1 each by Other route 3 (three) times daily. 01/20/20   [provider]   DG Foot Complete Left  Result Date: 06/01/2020 CLINICAL DATA:  Plantar foot wound. EXAM: LEFT FOOT - COMPLETE 3+ VIEW COMPARISON:  None. FINDINGS: Soft tissue wound along the lateral to plantar foot without evidence of underlying osteomyelitis. No adjacent soft tissue emphysema or opaque foreign body. Diffuse degenerative spurring, osteopenia, and arterial calcification IMPRESSION: Lateral soft  tissue wound without evidence of osteomyelitis. No opaque foreign body. Electronically Signed   By: Monte Fantasia M.D.   On: 06/01/2020 11:32   DG Foot Complete Right  Result Date: 06/01/2020 CLINICAL DATA:  Diabetic wound infection EXAM: RIGHT FOOT COMPLETE - 3+ VIEW COMPARISON:  None. FINDINGS: Previous amputation of the second toe at the proximal aspect of the proximal phalanx level. No evidence of regional fracture or osteomyelitis. Degenerative changes present in the midfoot. Regional vascular and soft tissue calcifications are present. IMPRESSION: Previous amputation of the second toe at the proximal aspect of the proximal phalanx. No evidence of regional fracture or osteomyelitis. Electronically Signed   By: Nelson Chimes M.D.   On: 06/01/2020 11:32   - pertinent xrays, CT, MRI studies were reviewed and independently interpreted  Positive ROS: All other systems have been reviewed and were otherwise negative with the exception of those mentioned in the HPI and as above.  Physical Exam: General: Alert, no acute distress Psychiatric: Patient is competent for consent with normal mood and affect Lymphatic: No axillary or cervical lymphadenopathy Cardiovascular: No pedal edema Respiratory: No cyanosis, no use of accessory musculature GI: No organomegaly, abdomen is soft and non-tender    Images:  @ENCIMAGES @  Labs:  Lab Results  Component  Value Date   HGBA1C 9.4 (H) 12/21/2019   HGBA1C 9.3 (H) 07/17/2017   HGBA1C  07/11/2007    5.9 (NOTE)  Therapeutic target for the treatment of diabetes mellitus patients is <7% HbA1c.  American Diabetes Association Diabetes Care 2002;25:S33-S49.   ESRSEDRATE 40 (H) 07/17/2017   CRP 11.1 (H) 07/17/2017   REPTSTATUS 12/26/2019 FINAL 12/21/2019   CULT  12/21/2019    NO GROWTH 5 DAYS Performed at [facility], [place] [address], [place], [place] [address]     Lab Results  Component Value Date   ALBUMIN 3.3 (L) 06/01/2020   ALBUMIN 3.1 (L) 12/22/2019   ALBUMIN 3.0 (L) 07/18/2017   PREALBUMIN 16.1 (L) 07/17/2017    Neurologic: Patient does not have protective sensation bilateral lower extremities.   MUSCULOSKELETAL:   Skin: Examination there is a necrotic ulcer over the base of the fifth metatarsal left foot this probes to bone there is purulent drainage there is cellulitis extends across the plantar aspect of the midfoot with tenderness cellulitis and tenderness to palpation.  Patient has a superficial Wagner grade 1 ulcer of the right heel.  [name] radiograph shows calcified vessels throughout the foot.  The radiographs do show periosteal reaction at the base of the fifth metatarsal left foot.  Previous ankle-brachial indices in 2018 showed noncompressible vessels.  Patient has brawny edema in both lower extremities with pitting edema from venous insufficiency but no venous ulcers.  Patient does not have palpable pulses.  Most recent albumin 3.3 and most recent hemoglobin A1c 9.4.  Assessment: Assessment: Abscess osteomyelitis and ulceration left midfoot.  Plan: Plan: Discussed that we could attempt foot salvage intervention that would require several operations and may not provide patient a stable foot for ambulation.  Discussed that we could also proceed with a transtibial amputation that would provide him the best function and had the best chance to heal.  Patient states  that he would like to proceed with a transtibial amputation but will discuss this with his family if there are more questions.  Will have patient transferred to [facility] with plain surgery on Wednesday.  Thank you for the consult and the opportunity to see Mr. Asher Torpey, MD  [employer] [phone] 4:26 PM

## 2020-06-01 NOTE — Progress Notes (Signed)
Pharmacy Antibiotic Note  Steven [name] is a 64 y.o. male admitted on 06/01/2020 with foot wound.  Pharmacy has been consulted for vancomycin dosing.  Pt has PMH significant for ESRD s/p renal transplant in 2006. Prescribed cephalexin and doxycycline PTA with no improvement.   Today, 06/01/20  WBC WNL  SCr = 2.07, CrCl = 47 mL/min  Afebrile  Plan:  Vancomycin 2000 mg LD followed by vancomycin 1500 mg IV q36h  Goal VT 10-20 and/or AUC 400-550  Monitor renal function and culture data  Height: 5\' 11"  (180.3 cm) Weight: 111.5 kg (245 lb 13 oz) IBW/kg (Calculated) : 75.3  Temp (24hrs), Avg:98.4 F (36.9 C), Min:98.1 F (36.7 C), Max:99 F (37.2 C)  Recent Labs  Lab 06/01/20 1147  WBC 6.4  CREATININE 2.07*  LATICACIDVEN 1.1    Estimated Creatinine Clearance: 47 mL/min (A) (by C-G formula based on SCr of 2.07 mg/dL (H)).    Allergies  Allergen Reactions  . Sulfa Antibiotics Rash    Antimicrobials this admission: vancomycin 11/1 >>  Piperacillin/tazobactam x1 11/1   Dose adjustments this admission:  Microbiology results: 11/1 BCx:   Thank you for allowing pharmacy to be a part of this patient's care.  Lenis Noon, PharmD 06/01/2020 6:27 PM

## 2020-06-02 ENCOUNTER — Other Ambulatory Visit: Payer: Self-pay | Admitting: Physician Assistant

## 2020-06-02 ENCOUNTER — Encounter (HOSPITAL_COMMUNITY): Payer: BC Managed Care – PPO

## 2020-06-02 ENCOUNTER — Inpatient Hospital Stay (HOSPITAL_COMMUNITY): Payer: BC Managed Care – PPO

## 2020-06-02 LAB — CBC
HCT: 28.7 % — ABNORMAL LOW (ref 39.0–52.0)
Hemoglobin: 9.1 g/dL — ABNORMAL LOW (ref 13.0–17.0)
MCH: 26.1 pg (ref 26.0–34.0)
MCHC: 31.7 g/dL (ref 30.0–36.0)
MCV: 82.2 fL (ref 80.0–100.0)
Platelets: 154 10*3/uL (ref 150–400)
RBC: 3.49 MIL/uL — ABNORMAL LOW (ref 4.22–5.81)
RDW: 14 % (ref 11.5–15.5)
WBC: 5.2 10*3/uL (ref 4.0–10.5)
nRBC: 0 % (ref 0.0–0.2)

## 2020-06-02 LAB — GLUCOSE, CAPILLARY
Glucose-Capillary: 261 mg/dL — ABNORMAL HIGH (ref 70–99)
Glucose-Capillary: 245 mg/dL — ABNORMAL HIGH (ref 70–99)
Glucose-Capillary: 244 mg/dL — ABNORMAL HIGH (ref 70–99)
Glucose-Capillary: 229 mg/dL — ABNORMAL HIGH (ref 70–99)

## 2020-06-02 LAB — BASIC METABOLIC PANEL
Anion gap: 11 (ref 5–15)
BUN: 40 mg/dL — ABNORMAL HIGH (ref 8–23)
CO2: 22 mmol/L (ref 22–32)
Calcium: 8.5 mg/dL — ABNORMAL LOW (ref 8.9–10.3)
Chloride: 102 mmol/L (ref 98–111)
Creatinine, Ser: 2.23 mg/dL — ABNORMAL HIGH (ref 0.61–1.24)
GFR, Estimated: 33 mL/min — ABNORMAL LOW (ref >60)
Glucose, Bld: 290 mg/dL — ABNORMAL HIGH (ref 70–99)
Potassium: 4.5 mmol/L (ref 3.5–5.1)
Sodium: 135 mmol/L (ref 135–145)

## 2020-06-02 LAB — MRSA PCR SCREENING: MRSA by PCR: NEGATIVE

## 2020-06-02 MED ORDER — SODIUM CHLORIDE 0.9 % IV SOLN
2.0000 g | Freq: Once | INTRAVENOUS | Status: AC
  Administered 2020-06-02: 2 g via INTRAVENOUS
  Filled 2020-06-02: qty 2

## 2020-06-02 MED ORDER — LINEZOLID 600 MG/300ML IV SOLN
600.0000 mg | Freq: Two times a day (BID) | INTRAVENOUS | Status: AC
  Administered 2020-06-02 – 2020-06-04 (×6): 600 mg via INTRAVENOUS
  Filled 2020-06-02 (×6): qty 300

## 2020-06-02 MED ORDER — INSULIN ASPART 100 UNIT/ML ~~LOC~~ SOLN
3.0000 [IU] | Freq: Three times a day (TID) | SUBCUTANEOUS | Status: DC
  Administered 2020-06-04 (×2): 3 [IU] via SUBCUTANEOUS

## 2020-06-02 MED ORDER — SODIUM CHLORIDE 0.9 % IV SOLN
2.0000 g | Freq: Two times a day (BID) | INTRAVENOUS | Status: AC
  Administered 2020-06-03 – 2020-06-04 (×4): 2 g via INTRAVENOUS
  Filled 2020-06-02: qty 0.13
  Filled 2020-06-02: qty 2
  Filled 2020-06-02: qty 0.13
  Filled 2020-06-02: qty 2

## 2020-06-02 MED ORDER — GADOBUTROL 1 MMOL/ML IV SOLN
10.0000 mL | Freq: Once | INTRAVENOUS | Status: AC | PRN
  Administered 2020-06-02: 10 mL via INTRAVENOUS

## 2020-06-02 MED ORDER — SODIUM CHLORIDE 0.9 % IV SOLN: INTRAVENOUS | Status: DC

## 2020-06-02 NOTE — Progress Notes (Signed)
OT Cancellation Note  Patient Details Name: Steven [name] MRN: [id number] DOB: 1956/04/08   Cancelled Treatment:    Reason Eval/Treat Not Completed: Patient at procedure or test/ unavailable. Cirrently at MRI of  L foot. Of note, planned transfer to [facility] from [facility] with planned transtibial amputation pending questions from family. OT will continue to follow for evaluation.   [place] 06/02/2020, 8:04 AM   Jesse Sans OTR/L Acute Rehabilitation Services Pager: [phone] Office: [phone]

## 2020-06-02 NOTE — Progress Notes (Signed)
Pharmacy Antibiotic Note  Steven [name] is a 64 y.o. male admitted on 06/01/2020 with foot wound.  Pharmacy has been consulted for cefepime dosing. Pt has PMH significant for ESRD s/p renal transplant in 2006. Prescribed cephalexin and doxycycline PTA with no improvement.   Today, 06/02/20  WBC WNL  SCr = 2.23, CrCl = 44 mL/min  Afebrile  11/2 MRI L foot: abscess. Osteomyelitis, extensive myositis L BKA scheduled for 11/3 at [facility] at 1620 by Dr Sharol Given  Plan:  Cefepime 2 gm IV q12  zyvox 600 mg IV q12  Monitor renal function and culture data  Height: 5\' 11"  (180.3 cm) Weight: 111.5 kg (245 lb 13 oz) IBW/kg (Calculated) : 75.3  Temp (24hrs), Avg:98.7 F (37.1 C), Min:97.8 F (36.6 C), Max:99.8 F (37.7 C)  Recent Labs  Lab 06/01/20 1147 06/02/20 0309  WBC 6.4 5.2  CREATININE 2.07* 2.23*  LATICACIDVEN 1.1  --     Estimated Creatinine Clearance: 43.6 mL/min (A) (by C-G formula based on SCr of 2.23 mg/dL (H)).    Allergies  Allergen Reactions  . Sulfa Antibiotics Rash  Antimicrobials this admission: 11/1 vancomycin x 1 dose 11/1 Piperacillin/tazobactam x1 dose 11/2 zyvox>> 11/2 cefepime>> Dose adjustments this admission:  Microbiology results: 11/1 BCx2:ngtd 11/2 MRSA neg 11/1 covid/flu neg   Thank you for allowing pharmacy to be a part of this patient's care.  Eudelia Bunch, Pharm.D 06/02/2020 2:11 PM

## 2020-06-02 NOTE — Progress Notes (Signed)
Inpatient Diabetes Program Recommendations  AACE/ADA: New Consensus Statement on Inpatient Glycemic Control (2015)  Target Ranges:  Prepandial:   less than 140 mg/dL      Peak postprandial:   less than 180 mg/dL (1-2 hours)      Critically ill patients:  140 - 180 mg/dL   Lab Results  Component Value Date   GLUCAP 261 (H) 06/02/2020   HGBA1C 9.3 (H) 06/01/2020    Review of Glycemic Control  Diabetes history: DM2 Outpatient Diabetes medications: Levemir 10 units QD, Novolog 0-30 units tidwc, glyburide 5 mg bid Current orders for Inpatient glycemic control: Levemir 10 units QHS, Novolog 0-15 units tidwc  HgbA1C - 9.3% For transtibial amputation on 11/3 at [facility] by Dr Sharol Given. Needs tighter glycemic control for healing. 290, 261 mg/dL this am. Eating 100%.  Inpatient Diabetes Program Recommendations:     Increase Levemir to 14 units QHS Add Novolog HS correction Add Novolog 4 units tidwc for meal coverage insulin.  Will speak with pt about his diabetes control and HgbA1C of 9.3%.  Continue to follow glucose trends.  Thank you. Lorenda Peck, RD, LDN, CDE Inpatient Diabetes Coordinator [phone]

## 2020-06-02 NOTE — Consult Note (Signed)
[facility] Nurse Consult Note: Reason for Consult: WOC Nursing is consulted simultaneously with Orthopedics and Dr. Sharol Given has already seen the patient. See his note from yesterday afternoon. Plan is for patient to be transferred to [facility] for a transtibial amputation pending any questions the family might have.  There is no role for WOC nursing at this time. Dr. Sharol Given will follow post procedure.  [facility] nursing team will not follow, but will remain available to this patient, the nursing and medical teams.  Please re-consult if needed. Thanks, Maudie Flakes, MSN, RN, [place], Arther Abbott  Pager# [phone]

## 2020-06-02 NOTE — Plan of Care (Signed)
  Problem: Education: Goal: Knowledge of General Education information will improve Description: Including pain rating scale, medication(s)/side effects and non-pharmacologic comfort measures Outcome: Progressing   Problem: Activity: Goal: Risk for activity intolerance will decrease Outcome: Progressing   Problem: Nutrition: Goal: Adequate nutrition will be maintained Outcome: Progressing   Problem: Education: Goal: Knowledge of General Education information will improve Description: Including pain rating scale, medication(s)/side effects and non-pharmacologic comfort measures Outcome: Progressing   Problem: Activity: Goal: Risk for activity intolerance will decrease Outcome: Progressing   Problem: Nutrition: Goal: Adequate nutrition will be maintained Outcome: Progressing

## 2020-06-02 NOTE — Progress Notes (Signed)
PROGRESS NOTE  Steven [name] BOF:[id number] DOB: [birth year] DOA: 06/01/2020 PCP: Isaac Bliss, Rayford Halsted, MD  HPI/Recap of past 86 hours: 64 year old male with a past medical history of obesity, OSA, hypertension, hyperlipidemia, type 2 diabetes ESRD s/p renal transplant in 2006 on immunosuppressants presented to [facility] ED due to a worsening diabetic pressure ulcer on the bottom of his left foot of 1 to 2 weeks duration.  He initially took Keflex without improvement and was switched to Doxy.  No improvement.  Presented to the ED for further evaluation and management of his wound.  Work up revealed left foot osteomyelitis by MRI.  Plan for amputation by Dr. Sharol Given on 06/03/20 at [facility].  06/02/20: Seen and examined with his wife at his bedside.  No pain in his left foot at the time of this visit, he received pain medication.    Assessment/Plan: Principal Problem:   Osteomyelitis of foot ([place]) Active Problems:   Diabetes mellitus ([place])   Hyperlipidemia   Kidney transplant recipient   Open wound of left foot   Diabetic foot infection ([place])   CKD (chronic kidney disease), stage III ([place])  Osteomyelitis of the left foot with cellulitis, failed outpatient antibiotics Started on IV vancomycin empirically-Switched to Linezolid on 06/02/20 due to AKI. Added Cefepime empirically for osteomyelitis on 06/02/20 Seen by orthopedic surgery, Dr. Geralyn Corwin transfer to [facility] for possible surgery on Wednesday, 06/03/2020. MRI left foot is consistent with osteomyelitis. Blood cultures drawn on 06/01/2020 - to date, continue to follow blood cultures. Will need bone culture for ID and sensitivities. Monitor fever curve and WBC.   Non oliguric AKI on CKD 3B status post renal transplant on immunosuppressants likely prerenal in the setting of dehydration 900 cc urine output recorded in the last 24 hours We will hold off his home diuretic, he takes 40 mg p.o. Lasix daily as needed,  start gentle IV fluid hydration Avoid nephrotoxins, dehydration and hypotension Continue to monitor urine output Daily renal panel Seen by nephrology, signed off.  Nonhealing bilateral lower extremity diabetic foot wound Continue local wound care Get bilateral arterial duplex US Orthopedic surgery following  Type 2 diabetes on insulin Obtain hemoglobin A1c Continue insulin sliding scale Avoid hyperglycemia for better wound healing  Hyperlipidemia/GERD Continue home regimen  Physical debility PT OT to assess Fall precautions    Code Status: Full code  Family Communication: Wife at bedside  Disposition Plan: Discharge to home likely with home health services when okay with orthopedic surgery and the patient is hemodynamically stable.   Consultants: Orthopedic surgery Nephrology, signed off on 06/01/2020.  Procedures:  None  Antimicrobials:  IV vancomycin  DVT prophylaxis: Subcu Heparin 3 times daily  Status is: Inpatient    Dispo:  Patient From: Home  Planned Disposition: Home with Health Care Svc  Expected discharge date: 06/05/20  Medically stable for discharge: No, ongoing management of left foot osteomyelitis requiring surgical intervention likely on 06/03/2020.         Objective: Vitals:   06/01/20 1420 06/01/20 1833 06/01/20 2105 06/02/20 0344  BP: (!) 141/75 (!) 130/59 134/74 (!) 146/69  Pulse: 78 75 75 72  Resp: 18 18 16 17   Temp: 98.2 F (36.8 C) 99 F (37.2 C) 99.8 F (37.7 C) 98.8 F (37.1 C)  TempSrc: Oral Oral Oral Oral  SpO2: 100% 100% 98% 97%  Weight: 111.5 kg     Height: 5\' 11"  (1.803 m)       Intake/Output Summary (Last 24  hours) at 06/02/2020 0745 Last data filed at 06/02/2020 9528 Gross per 24 hour  Intake 980 ml  Output 900 ml  Net 80 ml   Filed Weights   06/01/20 1420  Weight: 111.5 kg    Exam:  . General: 64 y.o. year-old male well developed well nourished in no acute distress.  Alert and oriented  x3. . Cardiovascular: Regular rate and rhythm with no rubs or gallops.  No thyromegaly or JVD noted.   Marland Kitchen Respiratory: Clear to auscultation with no wheezes or rales. Good inspiratory effort. . Abdomen: Soft nontender nondistended with normal bowel sounds x4 quadrants. . Musculoskeletal: Trace lower extremity edema bilaterally.  Left foot plantar wound.  R heel wound. Marland Kitchen Psychiatry: Mood is appropriate for condition and setting   Data Reviewed: CBC: Recent Labs  Lab 06/01/20 1147 06/02/20 0309  WBC 6.4 5.2  NEUTROABS 5.3  --   HGB 10.1* 9.1*  HCT 31.8* 28.7*  MCV 82.6 82.2  PLT 146* 413   Basic Metabolic Panel: Recent Labs  Lab 06/01/20 1147 06/02/20 0309  NA 137 135  K 4.6 4.5  CL 99 102  CO2 23 22  GLUCOSE 243* 290*  BUN 40* 40*  CREATININE 2.07* 2.23*  CALCIUM 8.9 8.5*   GFR: Estimated Creatinine Clearance: 43.6 mL/min (A) (by C-G formula based on SCr of 2.23 mg/dL (H)). Liver Function Tests: Recent Labs  Lab 06/01/20 1147  AST 18  ALT 27  ALKPHOS 116  BILITOT 1.2  PROT 6.9  ALBUMIN 3.3*   No results for input(s): LIPASE, AMYLASE in the last 168 hours. No results for input(s): AMMONIA in the last 168 hours. Coagulation Profile: No results for input(s): INR, PROTIME in the last 168 hours. Cardiac Enzymes: No results for input(s): CKTOTAL, CKMB, CKMBINDEX, TROPONINI in the last 168 hours. BNP (last 3 results) No results for input(s): PROBNP in the last 8760 hours. HbA1C: Recent Labs    06/01/20 1146  HGBA1C 9.3*   CBG: Recent Labs  Lab 06/01/20 1730 06/01/20 2105 06/02/20 0707  GLUCAP 232* 278* 261*   Lipid Profile: No results for input(s): CHOL, HDL, LDLCALC, TRIG, CHOLHDL, LDLDIRECT in the last 72 hours. Thyroid Function Tests: No results for input(s): TSH, T4TOTAL, FREET4, T3FREE, THYROIDAB in the last 72 hours. Anemia Panel: No results for input(s): VITAMINB12, FOLATE, FERRITIN, TIBC, IRON, RETICCTPCT in the last 72 hours. Urine  analysis:    Component Value Date/Time   COLORURINE AMBER BIOCHEMICALS MAY BE AFFECTED BY COLOR (A) 07/11/2007 [place] 07/11/2007 1851   LABSPEC 1.024 07/11/2007 1851   PHURINE 5.5 07/11/2007 1851   GLUCOSEU 500 (A) 07/11/2007 1851   HGBUR NEGATIVE 07/11/2007 1851   BILIRUBINUR SMALL (A) 07/11/2007 1851   [place] 07/11/2007 1851   PROTEINUR 30 (A) 07/11/2007 1851   UROBILINOGEN 1.0 07/11/2007 1851   NITRITE NEGATIVE 07/11/2007 1851   LEUKOCYTESUR TRACE (A) 07/11/2007 1851   Sepsis Labs: @LABRCNTIP (procalcitonin:4,lacticidven:4)  ) Recent Results (from the past 240 hour(s))  Respiratory Panel by RT PCR (Flu A&B, Covid) - Nasopharyngeal Swab     Status: None   Collection Time: 06/01/20 11:02 AM   Specimen: Nasopharyngeal Swab  Result Value Ref Range Status   SARS Coronavirus 2 by RT PCR NEGATIVE NEGATIVE Final    Comment: (NOTE) SARS-CoV-2 target nucleic acids are NOT DETECTED.  The SARS-CoV-2 RNA is generally detectable in upper respiratoy specimens during the acute phase of infection. The lowest concentration of SARS-CoV-2 viral copies this assay can  detect is 131 copies/mL. A negative result does not preclude SARS-Cov-2 infection and should not be used as the sole basis for treatment or other patient management decisions. A negative result may occur with  improper specimen collection/handling, submission of specimen other than nasopharyngeal swab, presence of viral mutation(s) within the areas targeted by this assay, and inadequate number of viral copies (<131 copies/mL). A negative result must be combined with clinical observations, patient history, and epidemiological information. The expected result is Negative.  Fact Sheet for Patients:  PinkCheek.be  Fact Sheet for Healthcare Providers:  GravelBags.it  This test is no t yet approved or cleared by the [place] FDA and  has  been authorized for detection and/or diagnosis of SARS-CoV-2 by FDA under an Emergency Use Authorization (EUA). This EUA will remain  in effect (meaning this test can be used) for the duration of the COVID-19 declaration under Section 564(b)(1) of the Act, 21 U.S.C. section 360bbb-3(b)(1), unless the authorization is terminated or revoked sooner.     Influenza A by PCR NEGATIVE NEGATIVE Final   Influenza B by PCR NEGATIVE NEGATIVE Final    Comment: (NOTE) The Xpert Xpress SARS-CoV-2/FLU/RSV assay is intended as an aid in  the diagnosis of influenza from Nasopharyngeal swab specimens and  should not be used as a sole basis for treatment. Nasal washings and  aspirates are unacceptable for Xpert Xpress SARS-CoV-2/FLU/RSV  testing.  Fact Sheet for Patients: PinkCheek.be  Fact Sheet for Healthcare Providers: GravelBags.it  This test is not yet approved or cleared by the [place] FDA and  has been authorized for detection and/or diagnosis of SARS-CoV-2 by  FDA under an Emergency Use Authorization (EUA). This EUA will remain  in effect (meaning this test can be used) for the duration of the  Covid-19 declaration under Section 564(b)(1) of the Act, 21  U.S.C. section 360bbb-3(b)(1), unless the authorization is  terminated or revoked. Performed at [facility], [place] [address], [place], [place] [address]   Culture, blood (routine x 2)     Status: None (Preliminary result)   Collection Time: 06/01/20 11:47 AM   Specimen: BLOOD  Result Value Ref Range Status   Specimen Description   Final    BLOOD RIGHT ANTECUBITAL Performed at [facility] [address], [place], [place] [address]    Special Requests   Final    BOTTLES DRAWN AEROBIC AND ANAEROBIC Blood Culture results may not be optimal due to an excessive volume of blood received in culture bottles Performed at [facility] [address], [place], [place] [address]    Culture   Final    NO GROWTH < 24 HOURS Performed at [facility] [address] [place], [place] [address]    Report Status PENDING  Incomplete  Culture, blood (routine x 2)     Status: None (Preliminary result)   Collection Time: 06/01/20 11:47 AM   Specimen: BLOOD  Result Value Ref Range Status   Specimen Description   Final    BLOOD LEFT ANTECUBITAL Performed at [facility] [address], [place], [place] [address]    Special Requests   Final    BOTTLES DRAWN AEROBIC AND ANAEROBIC Blood Culture results may not be optimal due to an excessive volume of blood received in culture bottles Performed at [facility] [address] [place], [place] [address]    Culture   Final    NO GROWTH <  24 HOURS Performed at [facility], [place] [address] [place], [place] [address]    Report Status PENDING  Incomplete      Studies: DG Foot Complete Left  Result Date: 06/01/2020 CLINICAL DATA:  Plantar foot wound. EXAM: LEFT FOOT - COMPLETE 3+ VIEW COMPARISON:  None. FINDINGS: Soft tissue wound along the lateral to plantar foot without evidence of underlying osteomyelitis. No adjacent soft tissue emphysema or opaque foreign body. Diffuse degenerative spurring, osteopenia, and arterial calcification IMPRESSION: Lateral soft tissue wound without evidence of osteomyelitis. No opaque foreign body. Electronically Signed   By: Monte Fantasia M.D.   On: 06/01/2020 11:32   DG Foot Complete Right  Result Date: 06/01/2020 CLINICAL DATA:  Diabetic wound infection EXAM: RIGHT FOOT COMPLETE - 3+ VIEW COMPARISON:  None. FINDINGS: Previous amputation of the second toe at the proximal aspect of the proximal phalanx level. No evidence of regional fracture or osteomyelitis. Degenerative changes present in the midfoot. Regional vascular and soft tissue calcifications are present. IMPRESSION: Previous  amputation of the second toe at the proximal aspect of the proximal phalanx. No evidence of regional fracture or osteomyelitis. Electronically Signed   By: Nelson Chimes M.D.   On: 06/01/2020 11:32    Scheduled Meds: . atorvastatin  10 mg Oral Daily  . fenofibrate  54 mg Oral Daily  . heparin  5,000 Units Subcutaneous Q8H  . insulin aspart  0-15 Units Subcutaneous TID WC  . insulin detemir  10 Units Subcutaneous QHS  . mycophenolate  360 mg Oral BID  . pantoprazole  40 mg Oral Daily  . tacrolimus  1 mg Oral BID    Continuous Infusions: . sodium chloride 250 mL (06/01/20 1838)  . sodium chloride 50 mL/hr at 06/02/20 0701  . [START ON 06/03/2020] vancomycin       LOS: 1 day     Kayleen Memos, MD [facility] Pager [phone]  If 7PM-7AM, please contact night-coverage [email or web address] Password [facility] 06/02/2020, 7:45 AM

## 2020-06-02 NOTE — Progress Notes (Signed)
PT Cancellation Note  Patient Details Name: Steven [name] MRN: [id number] DOB: 1956-03-19   Cancelled Treatment:    Reason Eval/Treat Not Completed: Medical issues which prohibited therapy. Per chart, plan is for transfer to [facility] and surgery on Wednesday. Will sign off and await orders and recommendations from orthopedic surgery. Thanks.   [facility]  Office: [phone] Pager: [phone]

## 2020-06-02 NOTE — Evaluation (Signed)
Occupational Therapy Evaluation Patient Details Name: Steven [name] MRN: [id number] DOB: August 20, 1955 Today's Date: 06/02/2020    History of Present Illness Pt is a 64 year old male with a PMH including obesity, OSA, hypertension, hyperlipidemia, type 2 diabetes ESRD s/p renal transplant in 2006 on immunosuppressants presented to the ED a worsening diabetic pressure ulcer on the bottom of his left foot 1 to 2 weeks. Planned Transtibial amputation 11/3.   Clinical Impression   Pt is typically independent in ADL and mobility. His house is wheelchair accessible (was designed that way for deceased uncle). Pt enjoys watching son play competitive baseball. Today Session focused on simulating transfers and functional tasks as can be expected post-amputation. All transfers were instructed to be performed NWB on LLE. Pt able to progress from min A to min guard with use of RW. Multimodal cues required initially for safety. Also focused on ADL education (dressing LLE first) and Pt was able to balance at sink in standing (BLE WB) for standing grooming tasks. OT will continue post-op to see what level post-acute therapy (CIR vs other?).     Follow Up Recommendations  Other (comment) (TBD post-amputation)    Equipment Recommendations  3 in 1 bedside commode (to be used as shower chair)    Recommendations for Other Services       Precautions / Restrictions Precautions Precautions: Fall Restrictions Weight Bearing Restrictions: No      Mobility Bed Mobility Overal bed mobility: Needs Assistance Bed Mobility: Supine to Sit     Supine to sit: Supervision;HOB elevated     General bed mobility comments: initially asking for help, but able to perform without any assist    Transfers Overall transfer level: Needs assistance Equipment used: Rolling walker (2 wheeled) Transfers: Sit to/from [facility] Sit to Stand: Min assist;Min guard Stand pivot transfers: Min guard        General transfer comment: Pt really struggled to seuquence standing with NWB on LLE and managing RW from bed, required multimodal cues and min A for balance along with steadying the RW. Able to progress to min guard, and much improved from chair with arm rest.     Balance Overall balance assessment: Needs assistance Sitting-balance support: Feet supported Sitting balance-Leahy Scale: Good     Standing balance support: Bilateral upper extremity supported Standing balance-Leahy Scale: Poor Standing balance comment: dependent on BUE support while maintaining NWB on LLE                           ADL either performed or assessed with clinical judgement   ADL Overall ADL's : Needs assistance/impaired Eating/Feeding: Independent   Grooming: Wash/dry hands;Oral care;Standing Grooming Details (indicate cue type and reason): sink Upper Body Bathing: Set up;Sitting   Lower Body Bathing: Set up;Sitting/lateral leans Lower Body Bathing Details (indicate cue type and reason): able to perform figure 4 Upper Body Dressing : Independent   Lower Body Dressing: Supervision/safety;Sit to/from stand   Toilet Transfer: Min guard;Ambulation;RW Toilet Transfer Details (indicate cue type and reason): practicing room abulation with NWB to LLE to simulate post-op Toileting- Clothing Manipulation and Hygiene: Set up;Sitting/lateral lean       Functional mobility during ADLs: Min guard;Cueing for safety;Rolling walker General ADL Comments: likes to do things "his way" educated on sequence for dressing (LLE first), decreased balance with gripper sock slipping on floor resulting in sitting back on chair quickly, struggles to get up from lower surface  Vision         Perception     Praxis      Pertinent Vitals/Pain       Hand Dominance Right   Extremity/Trunk Assessment Upper Extremity Assessment Upper Extremity Assessment: Overall WFL for tasks assessed   Lower Extremity  Assessment Lower Extremity Assessment: LLE deficits/detail LLE Deficits / Details: bandaged up, practiced NWB in prep for sx   Cervical / Trunk Assessment Cervical / Trunk Assessment: Other exceptions Cervical / Trunk Exceptions: obesity   Communication Communication Communication: No difficulties   Cognition Arousal/Alertness: Awake/alert Behavior During Therapy: WFL for tasks assessed/performed Overall Cognitive Status: Within Functional Limits for tasks assessed                                     General Comments  ex-wife in room at end of session (they are currently separated) and supportive    Exercises     Shoulder Instructions      Home Living Family/patient expects to be discharged to:: Private residence Living Arrangements: Alone Available Help at Discharge: Family;Available 24 hours/day (wife (separated) and very healthy parents, 4 children) Type of Home: House Home Access: Stairs to enter [employer] of Steps: 1 threshold Entrance Stairs-Rails: None Home Layout: One level     Bathroom Shower/Tub: Occupational psychologist: Handicapped height Bathroom Accessibility: Yes How Accessible: Accessible via wheelchair Home Equipment: Grab bars - toilet;Grab bars - tub/shower;Shower seat - built in          Prior Functioning/Environment Level of Independence: Independent                 OT Problem List: Impaired balance (sitting and/or standing);Decreased safety awareness;Decreased knowledge of use of DME or AE;Obesity      OT Treatment/Interventions: Self-care/ADL training;DME and/or AE instruction;Therapeutic exercise;Therapeutic activities;Patient/family education;Balance training    OT Goals(Current goals can be found in the care plan section) Acute Rehab OT Goals Patient Stated Goal: to be able to be active again to watch son play competitive baseball OT Goal Formulation: With patient Time For Goal Achievement:  06/16/20 Potential to Achieve Goals: Good ADL Goals Pt Will Perform Grooming: with modified independence;sitting Pt Will Perform Lower Body Bathing: with modified independence;sitting/lateral leans Pt Will Perform Lower Body Dressing: with min assist;sitting/lateral leans Pt Will Transfer to Toilet: with supervision;ambulating Pt Will Perform Toileting - Clothing Manipulation and hygiene: sitting/lateral leans;with set-up Additional ADL Goal #1: Pt will perform bed mobility at mod I level prior to engaging in ADL  OT Frequency: Min 2X/week   Barriers to D/C:            Co-evaluation              AM-PAC OT "6 Clicks" Daily Activity     Outcome Measure Help from another person eating meals?: None Help from another person taking care of personal grooming?: A Little Help from another person toileting, which includes using toliet, bedpan, or urinal?: A Little Help from another person bathing (including washing, rinsing, drying)?: A Little Help from another person to put on and taking off regular upper body clothing?: None Help from another person to put on and taking off regular lower body clothing?: A Little 6 Click Score: 20   End of Session Equipment Utilized During Treatment: Gait belt;Rolling walker Nurse Communication: Mobility status  Activity Tolerance: Patient tolerated treatment well Patient left: in chair;with call bell/phone  within reach;with family/visitor present  OT Visit Diagnosis: Unsteadiness on feet (R26.81);Other abnormalities of gait and mobility (R26.89);Pain Pain - Right/Left: Left Pain - part of body: Ankle and joints of foot                Time: 9407-6808 OT Time Calculation (min): 33 min Charges:  OT General Charges $OT Visit: 1 Visit OT Evaluation $OT Eval Moderate Complexity: 1 Mod OT Treatments $Self Care/Home Management : 8-22 mins  Jesse Sans OTR/L Acute Rehabilitation Services Pager: [phone] Office: [place] 06/02/2020, 11:45 AM

## 2020-06-03 ENCOUNTER — Encounter (HOSPITAL_COMMUNITY): Payer: Self-pay | Admitting: Internal Medicine

## 2020-06-03 ENCOUNTER — Inpatient Hospital Stay (HOSPITAL_COMMUNITY): Payer: BC Managed Care – PPO | Admitting: Certified Registered Nurse Anesthetist

## 2020-06-03 ENCOUNTER — Encounter (HOSPITAL_COMMUNITY)
Admission: EM | Disposition: A | Discharge status: Rehabilitation Facility | Payer: Self-pay | Source: Home / Self Care | Attending: Internal Medicine

## 2020-06-03 DIAGNOSIS — M86272 Subacute osteomyelitis, left ankle and foot: Secondary | ICD-10-CM | POA: Diagnosis not present

## 2020-06-03 DIAGNOSIS — E11628 Type 2 diabetes mellitus with other skin complications: Secondary | ICD-10-CM | POA: Diagnosis not present

## 2020-06-03 DIAGNOSIS — S91302A Unspecified open wound, left foot, initial encounter: Secondary | ICD-10-CM | POA: Diagnosis not present

## 2020-06-03 DIAGNOSIS — L089 Local infection of the skin and subcutaneous tissue, unspecified: Secondary | ICD-10-CM | POA: Diagnosis not present

## 2020-06-03 HISTORY — PX: AMPUTATION: SHX166

## 2020-06-03 LAB — CBC WITH DIFFERENTIAL/PLATELET
Abs Immature Granulocytes: 0.05 K/uL (ref 0.00–0.07)
Basophils Absolute: 0 K/uL (ref 0.0–0.1)
Basophils Relative: 0 %
Eosinophils Absolute: 0.1 K/uL (ref 0.0–0.5)
Eosinophils Relative: 2 %
HCT: 28.1 % — ABNORMAL LOW (ref 39.0–52.0)
Hemoglobin: 8.8 g/dL — ABNORMAL LOW (ref 13.0–17.0)
Immature Granulocytes: 1 %
Lymphocytes Relative: 12 %
Lymphs Abs: 0.6 K/uL — ABNORMAL LOW (ref 0.7–4.0)
MCH: 25.7 pg — ABNORMAL LOW (ref 26.0–34.0)
MCHC: 31.3 g/dL (ref 30.0–36.0)
MCV: 82.2 fL (ref 80.0–100.0)
Monocytes Absolute: 0.4 K/uL (ref 0.1–1.0)
Monocytes Relative: 10 %
Neutro Abs: 3.5 K/uL (ref 1.7–7.7)
Neutrophils Relative %: 75 %
Platelets: 150 K/uL (ref 150–400)
RBC: 3.42 MIL/uL — ABNORMAL LOW (ref 4.22–5.81)
RDW: 13.9 % (ref 11.5–15.5)
WBC: 4.7 K/uL (ref 4.0–10.5)
nRBC: 0 % (ref 0.0–0.2)

## 2020-06-03 LAB — MAGNESIUM: Magnesium: 1.7 mg/dL (ref 1.7–2.4)

## 2020-06-03 LAB — COMPREHENSIVE METABOLIC PANEL
ALT: 26 U/L (ref 0–44)
AST: 15 U/L (ref 15–41)
Albumin: 3 g/dL — ABNORMAL LOW (ref 3.5–5.0)
Alkaline Phosphatase: 113 U/L (ref 38–126)
Anion gap: 10 (ref 5–15)
BUN: 41 mg/dL — ABNORMAL HIGH (ref 8–23)
CO2: 20 mmol/L — ABNORMAL LOW (ref 22–32)
Calcium: 8.3 mg/dL — ABNORMAL LOW (ref 8.9–10.3)
Chloride: 102 mmol/L (ref 98–111)
Creatinine, Ser: 2.22 mg/dL — ABNORMAL HIGH (ref 0.61–1.24)
GFR, Estimated: 33 mL/min — ABNORMAL LOW (ref >60)
Glucose, Bld: 354 mg/dL — ABNORMAL HIGH (ref 70–99)
Potassium: 4.2 mmol/L (ref 3.5–5.1)
Sodium: 132 mmol/L — ABNORMAL LOW (ref 135–145)
Total Bilirubin: 1 mg/dL (ref 0.3–1.2)
Total Protein: 6.2 g/dL — ABNORMAL LOW (ref 6.5–8.1)

## 2020-06-03 LAB — GLUCOSE, CAPILLARY
Glucose-Capillary: 274 mg/dL — ABNORMAL HIGH (ref 70–99)
Glucose-Capillary: 283 mg/dL — ABNORMAL HIGH (ref 70–99)
Glucose-Capillary: 187 mg/dL — ABNORMAL HIGH (ref 70–99)
Glucose-Capillary: 176 mg/dL — ABNORMAL HIGH (ref 70–99)

## 2020-06-03 SURGERY — AMPUTATION BELOW KNEE
Anesthesia: Regional | Site: Knee | Laterality: Left

## 2020-06-03 MED ORDER — ONDANSETRON HCL 4 MG PO TABS: 4.0000 mg | ORAL_TABLET | Freq: Four times a day (QID) | ORAL | Status: DC | PRN

## 2020-06-03 MED ORDER — SODIUM CHLORIDE 0.9 % IV SOLN: INTRAVENOUS | Status: DC

## 2020-06-03 MED ORDER — CEFAZOLIN SODIUM-DEXTROSE 2-4 GM/100ML-% IV SOLN
2.0000 g | INTRAVENOUS | Status: AC
  Administered 2020-06-03: 2 g via INTRAVENOUS
  Filled 2020-06-03 (×2): qty 100

## 2020-06-03 MED ORDER — ONDANSETRON HCL 4 MG/2ML IJ SOLN: 4.0000 mg | Freq: Four times a day (QID) | INTRAMUSCULAR | Status: DC | PRN

## 2020-06-03 MED ORDER — PHENYLEPHRINE 40 MCG/ML (10ML) SYRINGE FOR IV PUSH (FOR BLOOD PRESSURE SUPPORT)
PREFILLED_SYRINGE | INTRAVENOUS | Status: DC | PRN
  Administered 2020-06-03: 120 ug via INTRAVENOUS

## 2020-06-03 MED ORDER — METOCLOPRAMIDE HCL 5 MG PO TABS: 5.0000 mg | ORAL_TABLET | Freq: Three times a day (TID) | ORAL | Status: DC | PRN

## 2020-06-03 MED ORDER — MAGNESIUM SULFATE 2 GM/50ML IV SOLN
2.0000 g | Freq: Once | INTRAVENOUS | Status: AC
  Administered 2020-06-03: 2 g via INTRAVENOUS
  Filled 2020-06-03: qty 50

## 2020-06-03 MED ORDER — ENSURE SURGERY PO LIQD
237.0000 mL | ORAL | Status: DC
  Filled 2020-06-03: qty 237

## 2020-06-03 MED ORDER — DEXAMETHASONE SODIUM PHOSPHATE 10 MG/ML IJ SOLN
INTRAMUSCULAR | Status: DC | PRN
  Administered 2020-06-03: 4 mg via INTRAVENOUS

## 2020-06-03 MED ORDER — SODIUM CHLORIDE 0.9 % IR SOLN
Status: DC | PRN
  Administered 2020-06-03: 1000 mL

## 2020-06-03 MED ORDER — MIDAZOLAM HCL 2 MG/2ML IJ SOLN
INTRAMUSCULAR | Status: AC
  Filled 2020-06-03: qty 2

## 2020-06-03 MED ORDER — ONDANSETRON HCL 4 MG/2ML IJ SOLN
INTRAMUSCULAR | Status: AC
  Filled 2020-06-03: qty 2

## 2020-06-03 MED ORDER — FENTANYL CITRATE (PF) 250 MCG/5ML IJ SOLN
INTRAMUSCULAR | Status: AC
  Filled 2020-06-03: qty 5

## 2020-06-03 MED ORDER — MIDAZOLAM HCL 2 MG/2ML IJ SOLN: 1.0000 mg | Freq: Once | INTRAMUSCULAR | Status: AC

## 2020-06-03 MED ORDER — DOCUSATE SODIUM 100 MG PO CAPS
100.0000 mg | ORAL_CAPSULE | Freq: Two times a day (BID) | ORAL | Status: DC
  Administered 2020-06-03 – 2020-06-10 (×13): 100 mg via ORAL
  Filled 2020-06-03 (×13): qty 1

## 2020-06-03 MED ORDER — METOCLOPRAMIDE HCL 5 MG/ML IJ SOLN: 5.0000 mg | Freq: Three times a day (TID) | INTRAMUSCULAR | Status: DC | PRN

## 2020-06-03 MED ORDER — MIDAZOLAM HCL 2 MG/2ML IJ SOLN
INTRAMUSCULAR | Status: AC
  Administered 2020-06-03: 1 mg via INTRAVENOUS
  Filled 2020-06-03: qty 2

## 2020-06-03 MED ORDER — ROPIVACAINE HCL 5 MG/ML IJ SOLN
INTRAMUSCULAR | Status: DC | PRN
  Administered 2020-06-03: 25 mL via PERINEURAL
  Administered 2020-06-03: 20 mL via PERINEURAL

## 2020-06-03 MED ORDER — HYDROMORPHONE HCL 1 MG/ML IJ SOLN
0.5000 mg | INTRAMUSCULAR | Status: DC | PRN
  Administered 2020-06-04 – 2020-06-08 (×7): 0.5 mg via INTRAVENOUS
  Filled 2020-06-03 (×7): qty 0.5

## 2020-06-03 MED ORDER — PROSOURCE PLUS PO LIQD
30.0000 mL | Freq: Three times a day (TID) | ORAL | Status: DC
  Administered 2020-06-03 – 2020-06-10 (×18): 30 mL via ORAL
  Filled 2020-06-03 (×18): qty 30

## 2020-06-03 MED ORDER — FENTANYL CITRATE (PF) 100 MCG/2ML IJ SOLN
INTRAMUSCULAR | Status: AC
  Filled 2020-06-03: qty 2

## 2020-06-03 MED ORDER — ONDANSETRON HCL 4 MG/2ML IJ SOLN
INTRAMUSCULAR | Status: DC | PRN
  Administered 2020-06-03: 4 mg via INTRAVENOUS

## 2020-06-03 MED ORDER — PHENYLEPHRINE 40 MCG/ML (10ML) SYRINGE FOR IV PUSH (FOR BLOOD PRESSURE SUPPORT)
PREFILLED_SYRINGE | INTRAVENOUS | Status: AC
  Filled 2020-06-03: qty 10

## 2020-06-03 MED ORDER — CHLORHEXIDINE GLUCONATE 0.12 % MT SOLN
15.0000 mL | Freq: Once | OROMUCOSAL | Status: AC
  Administered 2020-06-03: 15 mL via OROMUCOSAL
  Filled 2020-06-03: qty 15

## 2020-06-03 MED ORDER — PROPOFOL 10 MG/ML IV BOLUS
INTRAVENOUS | Status: AC
  Filled 2020-06-03: qty 20

## 2020-06-03 MED ORDER — PROPOFOL 10 MG/ML IV BOLUS
INTRAVENOUS | Status: DC | PRN
  Administered 2020-06-03 (×2): 50 mg via INTRAVENOUS
  Administered 2020-06-03: 100 mg via INTRAVENOUS

## 2020-06-03 SURGICAL SUPPLY — 40 items
BLADE SAW RECIP 87.9 MT (BLADE) ×3 IMPLANT
BLADE SURG 21 STRL SS (BLADE) ×3 IMPLANT
BNDG COHESIVE 6X5 TAN STRL LF (GAUZE/BANDAGES/DRESSINGS) ×2 IMPLANT
CANISTER WOUND CARE 500ML ATS (WOUND CARE) ×3 IMPLANT
COVER SURGICAL LIGHT HANDLE (MISCELLANEOUS) ×3 IMPLANT
CUFF TOURN SGL QUICK 34 (TOURNIQUET CUFF) ×3
CUFF TRNQT CYL 34X4.125X (TOURNIQUET CUFF) ×1 IMPLANT
DRAPE DERMATAC (DRAPES) ×2 IMPLANT
DRAPE INCISE IOBAN 66X45 STRL (DRAPES) ×3 IMPLANT
DRAPE U-SHAPE 47X51 STRL (DRAPES) ×3 IMPLANT
DRESSING PREVENA PLUS CUSTOM (GAUZE/BANDAGES/DRESSINGS) ×1 IMPLANT
DRSG PREVENA PLUS CUSTOM (GAUZE/BANDAGES/DRESSINGS) ×3
DURAPREP 26ML APPLICATOR (WOUND CARE) ×3 IMPLANT
ELECT REM PT RETURN 9FT ADLT (ELECTROSURGICAL) ×3
ELECTRODE REM PT RTRN 9FT ADLT (ELECTROSURGICAL) ×1 IMPLANT
GLOVE BIOGEL PI IND STRL 9 (GLOVE) ×1 IMPLANT
GLOVE BIOGEL PI INDICATOR 9 (GLOVE) ×2
GLOVE SURG ORTHO 9.0 STRL STRW (GLOVE) ×3 IMPLANT
GOWN STRL REUS W/ TWL XL LVL3 (GOWN DISPOSABLE) ×2 IMPLANT
GOWN STRL REUS W/TWL XL LVL3 (GOWN DISPOSABLE) ×6
KIT BASIN OR (CUSTOM PROCEDURE TRAY) ×3 IMPLANT
KIT TURNOVER KIT B (KITS) ×3 IMPLANT
MANIFOLD NEPTUNE II (INSTRUMENTS) ×3 IMPLANT
NS IRRIG 1000ML POUR BTL (IV SOLUTION) ×3 IMPLANT
PACK ORTHO EXTREMITY (CUSTOM PROCEDURE TRAY) ×3 IMPLANT
PAD ARMBOARD 7.5X6 YLW CONV (MISCELLANEOUS) ×3 IMPLANT
PREVENA RESTOR ARTHOFORM 46X30 (CANNISTER) ×3 IMPLANT
PREVENA RESTOR AXIOFORM 29X28 (GAUZE/BANDAGES/DRESSINGS) ×2 IMPLANT
SPONGE LAP 18X18 RF (DISPOSABLE) ×2 IMPLANT
STAPLER VISISTAT 35W (STAPLE) ×2 IMPLANT
STOCKINETTE IMPERVIOUS LG (DRAPES) ×3 IMPLANT
SUT ETHILON 2 0 PSLX (SUTURE) IMPLANT
SUT SILK 2 0 (SUTURE) ×3
SUT SILK 2-0 18XBRD TIE 12 (SUTURE) ×1 IMPLANT
SUT VIC AB 1 CTX 27 (SUTURE) ×6 IMPLANT
TAPE STRIPS DRAPE STRL (GAUZE/BANDAGES/DRESSINGS) ×2 IMPLANT
TOWEL GREEN STERILE (TOWEL DISPOSABLE) ×3 IMPLANT
TUBE CONNECTING 12'X1/4 (SUCTIONS) ×1
TUBE CONNECTING 12X1/4 (SUCTIONS) ×2 IMPLANT
YANKAUER SUCT BULB TIP NO VENT (SUCTIONS) ×3 IMPLANT

## 2020-06-03 NOTE — Anesthesia Procedure Notes (Signed)
Procedure Name: LMA Insertion Date/Time: 06/03/2020 4:43 PM Performed by: Barrington Ellison, CRNA Pre-anesthesia Checklist: Patient identified, Emergency Drugs available, Suction available and Patient being monitored Patient Re-evaluated:Patient Re-evaluated prior to induction Oxygen Delivery Method: Circle System Utilized Preoxygenation: Pre-oxygenation with 100% oxygen Induction Type: IV induction Ventilation: Mask ventilation without difficulty LMA: LMA inserted LMA Size: 5.0 Number of attempts: 1 Placement Confirmation: positive ETCO2 Tube secured with: Tape Dental Injury: Teeth and Oropharynx as per pre-operative assessment

## 2020-06-03 NOTE — Progress Notes (Signed)
PROGRESS NOTE    Steven [name]  UEA:[id number] DOB: [birth year] DOA: 06/01/2020 PCP: Isaac Bliss, Rayford Halsted, MD    Brief Narrative:  64 year old male with a past medical history of obesity, OSA, hypertension, hyperlipidemia, type 2 diabetes ESRD s/p renal transplant in 2006 on immunosuppressants presented to [facility] ED due to a worsening diabetic pressure ulcer on the bottom of his left foot of 1 to 2 weeks duration. He initially took Keflex without improvement and was switched to Doxy.  No improvement.  Presented to the ED for further evaluation and management of his wound.  Work up revealed left foot osteomyelitis by MRI.  Assessment & Plan:   Principal Problem:   Osteomyelitis of foot ([place]) Active Problems:   Diabetes mellitus ([place])   Hyperlipidemia   Kidney transplant recipient   Open wound of left foot   Diabetic foot infection ([place])   CKD (chronic kidney disease), stage III ([place])  Osteomyelitis of the left foot with cellulitis, failed outpatient antibiotics Pt was initially started on IV vancomycin empirically, later transitioned to Linezolid on 06/02/20 due to AKI with Cefepime later added for osteomyelitis on 06/02/20 MRI left foot is consistent with osteomyelitis. Orthopedic Surgery consulted and pt is scheduled for amputation 06/03/20  Non oliguric AKI on CKD 3B status post renal transplant on immunosuppressants likely prerenal in the setting of dehydration Cr remains stable at 2.22 this AM Diuretics on hold Continue prograf and myfortic without steroids per Nephrology Continue to avoid nephrotoxins Continue to monitor urine output Nephrology has signed off  Nonhealing bilateral lower extremity diabetic foot wound Continue local wound care Orthopedic Surgery following, plan for surgery today  Type 2 diabetes on insulin a1c of 9.3 Continue insulin sliding scale Avoid hyperglycemia for better wound healing  Hyperlipidemia/GERD Continue home  regimen  Physical debility Plan PT/OT post-op  DVT prophylaxis: Heparin subq Code Status: Full Family Communication: Pt in room, family not at bedside  Status is: Inpatient  Remains inpatient appropriate because:Ongoing diagnostic testing needed not appropriate for outpatient work up and Inpatient level of care appropriate due to severity of illness   Dispo:  Patient From: Home  Planned Disposition: Home with Health Care Svc  Expected discharge date: 06/05/20  Medically stable for discharge: No        Consultants:   Orthopedic Surgery  Procedures:     Antimicrobials: Anti-infectives (From admission, onward)   Start     Dose/Rate Route Frequency Ordered Stop   06/03/20 0800  ceFAZolin (ANCEF) IVPB 2g/100 mL premix        2 g 200 mL/hr over 30 Minutes Intravenous On call to O.R. 06/03/20 0701 06/04/20 0559   06/03/20 0600  vancomycin (VANCOREADY) IVPB 1500 mg/300 mL  Status:  Discontinued        1,500 mg 150 mL/hr over 120 Minutes Intravenous Every 36 hours 06/01/20 1826 06/02/20 1130   06/03/20 0000  [MAR Hold]  ceFEPIme (MAXIPIME) 2 g in sodium chloride 0.9 % 100 mL IVPB        (MAR Hold since Wed 06/03/2020 at 1428.Hold Reason: Transfer to a Procedural area.)   2 g 200 mL/hr over 30 Minutes Intravenous Every 12 hours 06/02/20 1408     06/02/20 1500  ceFEPIme (MAXIPIME) 2 g in sodium chloride 0.9 % 100 mL IVPB        2 g 200 mL/hr over 30 Minutes Intravenous  Once 06/02/20 1408 06/02/20 1653   06/02/20 1400  [MAR Hold]  linezolid (ZYVOX) IVPB 600 mg        (  MAR Hold since Wed 06/03/2020 at 1428.Hold Reason: Transfer to a Procedural area.)   600 mg 300 mL/hr over 60 Minutes Intravenous Every 12 hours 06/02/20 1122     06/01/20 1845  vancomycin (VANCOCIN) IVPB 1000 mg/200 mL premix  Status:  Discontinued        1,000 mg 200 mL/hr over 60 Minutes Intravenous  Once 06/01/20 1759 06/01/20 1815   06/01/20 1845  vancomycin (VANCOREADY) IVPB 2000 mg/400 mL        2,000  mg 200 mL/hr over 120 Minutes Intravenous  Once 06/01/20 1816 06/01/20 2042   06/01/20 1045  piperacillin-tazobactam (ZOSYN) IVPB 3.375 g        3.375 g 100 mL/hr over 30 Minutes Intravenous  Once 06/01/20 1043 06/01/20 1237       Subjective: Eager to have surgery today  Objective: Vitals:   06/03/20 0508 06/03/20 1356 06/03/20 1452 06/03/20 1533  BP: 129/64 (!) 142/76  (!) 154/65  Pulse: 72 64  66  Resp: 17 18  16   Temp: 98 F (36.7 C) 97.7 F (36.5 C)    TempSrc: Oral Oral    SpO2: 98% 100%  100%  Weight:   111.5 kg   Height:   5' 10.98" (1.803 m)     Intake/Output Summary (Last 24 hours) at 06/03/2020 1541 Last data filed at 06/03/2020 1307 Gross per 24 hour  Intake 2380.59 ml  Output 1200 ml  Net 1180.59 ml   Filed Weights   06/01/20 1420 06/03/20 1452  Weight: 111.5 kg 111.5 kg    Examination:  General exam: Appears calm and comfortable  Respiratory system: Clear to auscultation. Respiratory effort normal. Cardiovascular system: S1 & S2 heard, Regular Gastrointestinal system: Abdomen is nondistended, soft and nontender. No organomegaly or masses felt. Normal bowel sounds heard. Central nervous system: Alert and oriented. No focal neurological deficits. Extremities: Symmetric 5 x 5 power, L foot with dressings in place Skin: No rashes, lesions Psychiatry: Judgement and insight appear normal. Mood & affect appropriate.   Data Reviewed: I have personally reviewed following labs and imaging studies  CBC: Recent Labs  Lab 06/01/20 1147 06/02/20 0309 06/03/20 0321  WBC 6.4 5.2 4.7  NEUTROABS 5.3  --  3.5  HGB 10.1* 9.1* 8.8*  HCT 31.8* 28.7* 28.1*  MCV 82.6 82.2 82.2  PLT 146* 154 295   Basic Metabolic Panel: Recent Labs  Lab 06/01/20 1147 06/02/20 0309 06/03/20 0321  NA 137 135 132*  K 4.6 4.5 4.2  CL 99 102 102  CO2 23 22 20*  GLUCOSE 243* 290* 354*  BUN 40* 40* 41*  CREATININE 2.07* 2.23* 2.22*  CALCIUM 8.9 8.5* 8.3*  MG  --   --  1.7    GFR: Estimated Creatinine Clearance: 43.8 mL/min (A) (by C-G formula based on SCr of 2.22 mg/dL (H)). Liver Function Tests: Recent Labs  Lab 06/01/20 1147 06/03/20 0321  AST 18 15  ALT 27 26  ALKPHOS 116 113  BILITOT 1.2 1.0  PROT 6.9 6.2*  ALBUMIN 3.3* 3.0*   No results for input(s): LIPASE, AMYLASE in the last 168 hours. No results for input(s): AMMONIA in the last 168 hours. Coagulation Profile: No results for input(s): INR, PROTIME in the last 168 hours. Cardiac Enzymes: No results for input(s): CKTOTAL, CKMB, CKMBINDEX, TROPONINI in the last 168 hours. BNP (last 3 results) No results for input(s): PROBNP in the last 8760 hours. HbA1C: Recent Labs    06/01/20 1146  HGBA1C 9.3*   CBG:  Recent Labs  Lab 06/02/20 1712 06/02/20 2113 06/03/20 0720 06/03/20 1146 06/03/20 1448  GLUCAP 244* 229* 274* 283* 187*   Lipid Profile: No results for input(s): CHOL, HDL, LDLCALC, TRIG, CHOLHDL, LDLDIRECT in the last 72 hours. Thyroid Function Tests: No results for input(s): TSH, T4TOTAL, FREET4, T3FREE, THYROIDAB in the last 72 hours. Anemia Panel: No results for input(s): VITAMINB12, FOLATE, FERRITIN, TIBC, IRON, RETICCTPCT in the last 72 hours. Sepsis Labs: Recent Labs  Lab 06/01/20 1147  LATICACIDVEN 1.1    Recent Results (from the past 240 hour(s))  Respiratory Panel by RT PCR (Flu A&B, Covid) - Nasopharyngeal Swab     Status: None   Collection Time: 06/01/20 11:02 AM   Specimen: Nasopharyngeal Swab  Result Value Ref Range Status   SARS Coronavirus 2 by RT PCR NEGATIVE NEGATIVE Final    Comment: (NOTE) SARS-CoV-2 target nucleic acids are NOT DETECTED.  The SARS-CoV-2 RNA is generally detectable in upper respiratoy specimens during the acute phase of infection. The lowest concentration of SARS-CoV-2 viral copies this assay can detect is 131 copies/mL. A negative result does not preclude SARS-Cov-2 infection and should not be used as the sole basis for  treatment or other patient management decisions. A negative result may occur with  improper specimen collection/handling, submission of specimen other than nasopharyngeal swab, presence of viral mutation(s) within the areas targeted by this assay, and inadequate number of viral copies (<131 copies/mL). A negative result must be combined with clinical observations, patient history, and epidemiological information. The expected result is Negative.  Fact Sheet for Patients:  PinkCheek.be  Fact Sheet for Healthcare Providers:  GravelBags.it  This test is no t yet approved or cleared by the [place] FDA and  has been authorized for detection and/or diagnosis of SARS-CoV-2 by FDA under an Emergency Use Authorization (EUA). This EUA will remain  in effect (meaning this test can be used) for the duration of the COVID-19 declaration under Section 564(b)(1) of the Act, 21 U.S.C. section 360bbb-3(b)(1), unless the authorization is terminated or revoked sooner.     Influenza A by PCR NEGATIVE NEGATIVE Final   Influenza B by PCR NEGATIVE NEGATIVE Final    Comment: (NOTE) The Xpert Xpress SARS-CoV-2/FLU/RSV assay is intended as an aid in  the diagnosis of influenza from Nasopharyngeal swab specimens and  should not be used as a sole basis for treatment. Nasal washings and  aspirates are unacceptable for Xpert Xpress SARS-CoV-2/FLU/RSV  testing.  Fact Sheet for Patients: PinkCheek.be  Fact Sheet for Healthcare Providers: GravelBags.it  This test is not yet approved or cleared by the [place] FDA and  has been authorized for detection and/or diagnosis of SARS-CoV-2 by  FDA under an Emergency Use Authorization (EUA). This EUA will remain  in effect (meaning this test can be used) for the duration of the  Covid-19 declaration under Section 564(b)(1) of the Act, 21   U.S.C. section 360bbb-3(b)(1), unless the authorization is  terminated or revoked. Performed at [facility], [place] [address] [place], [place] [address]   Culture, blood (routine x 2)     Status: None (Preliminary result)   Collection Time: 06/01/20 11:47 AM   Specimen: BLOOD  Result Value Ref Range Status   Specimen Description   Final    BLOOD RIGHT ANTECUBITAL Performed at [facility] [address], [place], [place] [address]    Special Requests   Final    BOTTLES DRAWN AEROBIC AND ANAEROBIC Blood Culture results  may not be optimal due to an excessive volume of blood received in culture bottles Performed at [facility] [address] [place], [place] [address]    Culture   Final    NO GROWTH 2 DAYS Performed at [facility] [address], [place], [place] [address]    Report Status PENDING  Incomplete  Culture, blood (routine x 2)     Status: None (Preliminary result)   Collection Time: 06/01/20 11:47 AM   Specimen: BLOOD  Result Value Ref Range Status   Specimen Description   Final    BLOOD LEFT ANTECUBITAL Performed at [facility] [address], [place], [place] [address]    Special Requests   Final    BOTTLES DRAWN AEROBIC AND ANAEROBIC Blood Culture results may not be optimal due to an excessive volume of blood received in culture bottles Performed at [facility] [address] [place], [place] [address]    Culture   Final    NO GROWTH 2 DAYS Performed at [facility] [address], [place], [place] [address]    Report Status PENDING  Incomplete  MRSA PCR Screening     Status: None   Collection Time: 06/02/20  7:00 AM   Specimen: Nasopharyngeal  Result Value Ref Range Status   MRSA by PCR NEGATIVE NEGATIVE Final    Comment:        The GeneXpert MRSA Assay (FDA approved for NASAL specimens only), is one component of a comprehensive MRSA  colonization surveillance program. It is not intended to diagnose MRSA infection nor to guide or monitor treatment for MRSA infections. Performed at [facility], [place] [address] [place] [address], [place], [place] [address]      Radiology Studies: MR FOOT LEFT W WO CONTRAST  Result Date: 06/02/2020 CLINICAL DATA:  Pain, swelling and diabetic ulcer EXAM: MRI OF THE LEFT FOREFOOT WITHOUT AND WITH CONTRAST TECHNIQUE: Multiplanar, multisequence MR imaging of the left foot was performed both before and after administration of intravenous contrast. CONTRAST:  16mL GADAVIST GADOBUTROL 1 MMOL/ML IV SOLN COMPARISON:  Radiographs 06/01/2020 FINDINGS: There is an open wound along the plantar and lateral aspect of the foot at the level of the base of the fifth metatarsal. Significant focal/localized cellulitis and extensive rim enhancing abscess. This measures approximately 5.0 x 4.8 cm. Underlying osteomyelitis involving the base of the fifth metatarsal. The peroneus brevis tendon is intact. No pathologic fracture. No other sites of osteomyelitis are identified. No findings for septic arthritis involving the fifth metatarsal cuboid joint. Extensive myositis involving the short flexor muscles of the foot without findings for pyomyositis. There is moderate plantar fasciitis. The tibiotalar and subtalar joints are maintained. Sinus tarsi is unremarkable. IMPRESSION: 1. Open wound along the plantar and lateral aspect of the foot at the level of the base of the fifth metatarsal. Associated 5.0 x 4.8 cm subcutaneous abscess. 2. Underlying osteomyelitis involving the base of the fifth metatarsal. 3. Extensive myositis involving the short flexor muscles of the foot without findings for pyomyositis. 4. Moderate plantar fasciitis. Electronically Signed   By: Marijo Sanes M.D.   On: 06/02/2020 09:47    Scheduled Meds: . [MAR Hold] (feeding supplement) PROSource Plus  30 mL Oral TID BM  . [MAR Hold] atorvastatin  10 mg  Oral Daily  . [MAR Hold] feeding supplement  237 mL Oral Q24H  . [MAR Hold] fenofibrate  54 mg Oral Daily  . fentaNYL      . [  MAR Hold] heparin  5,000 Units Subcutaneous Q8H  . [MAR Hold] insulin aspart  0-15 Units Subcutaneous TID WC  . [MAR Hold] insulin aspart  3 Units Subcutaneous TID WC  . [MAR Hold] insulin detemir  10 Units Subcutaneous QHS  . midazolam      . [MAR Hold] mycophenolate  360 mg Oral BID  . [MAR Hold] pantoprazole  40 mg Oral Daily  . [MAR Hold] tacrolimus  1 mg Oral BID   Continuous Infusions: . [MAR Hold] sodium chloride 250 mL (06/01/20 1838)  . sodium chloride 50 mL/hr at 06/03/20 0005  . sodium chloride 10 mL/hr at 06/03/20 1518  .  ceFAZolin (ANCEF) IV    . [MAR Hold] ceFEPime (MAXIPIME) IV 2 g (06/03/20 1307)  . [MAR Hold] linezolid (ZYVOX) IV 600 mg (06/03/20 1007)     LOS: 2 days   Marylu Lund, MD [facility] Pager On Amion  If 7PM-7AM, please contact night-coverage 06/03/2020, 3:41 PM

## 2020-06-03 NOTE — Progress Notes (Signed)
Patient received from PACU- Alert and oriented x 4.  Wound Vac to left leg intact.  No drainage noted.

## 2020-06-03 NOTE — Anesthesia Postprocedure Evaluation (Signed)
Anesthesia Post Note  Patient: Steven [name]  Procedure(s) Performed: LEFT BELOW KNEE AMPUTATION (Left Knee)     Patient location during evaluation: PACU Anesthesia Type: Regional and General Level of consciousness: awake and alert Pain management: pain level controlled Vital Signs Assessment: post-procedure vital signs reviewed and stable Respiratory status: spontaneous breathing, nonlabored ventilation, respiratory function stable and patient connected to nasal cannula oxygen Cardiovascular status: blood pressure returned to baseline and stable Postop Assessment: no apparent nausea or vomiting Anesthetic complications: no   No complications documented.  Last Vitals:  Vitals:   06/03/20 1859 06/03/20 1944  BP: (!) 141/78 (!) 123/57  Pulse: 73 77  Resp: 16 15  Temp: 36.8 C (!) 36.3 C  SpO2: 100% 100%    Last Pain:  Vitals:   06/03/20 1944  TempSrc: Oral  PainSc:                  Marylon Verno P Harlow Basley

## 2020-06-03 NOTE — Interval H&P Note (Signed)
History and Physical Interval Note:  06/03/2020 1:22 PM  Steven [name]  has presented today for surgery, with the diagnosis of Left Foot Osteomyelitis.  The various methods of treatment have been discussed with the patient and family. After consideration of risks, benefits and other options for treatment, the patient has consented to  Procedure(s): LEFT BELOW KNEE AMPUTATION (Left) as a surgical intervention.  The patient's history has been reviewed, patient examined, no change in status, stable for surgery.  I have reviewed the patient's chart and labs.  Questions were answered to the patient's satisfaction.     Newt Minion

## 2020-06-03 NOTE — Interval H&P Note (Signed)
History and Physical Interval Note:  06/03/2020 7:01 AM  Steven [name]  has presented today for surgery, with the diagnosis of Left Foot Osteomyelitis.  The various methods of treatment have been discussed with the patient and family. After consideration of risks, benefits and other options for treatment, the patient has consented to  Procedure(s): LEFT BELOW KNEE AMPUTATION (Left) as a surgical intervention.  The patient's history has been reviewed, patient examined, no change in status, stable for surgery.  I have reviewed the patient's chart and labs.  Questions were answered to the patient's satisfaction.     Newt Minion

## 2020-06-03 NOTE — Transfer of Care (Signed)
Immediate Anesthesia Transfer of Care Note  Patient: Steven [name]  Procedure(s) Performed: LEFT BELOW KNEE AMPUTATION (Left Knee)  Patient Location: PACU  Anesthesia Type:General and Regional  Level of Consciousness: awake  Airway & Oxygen Therapy: Patient Spontanous Breathing  Post-op Assessment: Report given to RN  Post vital signs: Reviewed and stable  Last Vitals:  Vitals Value Taken Time  BP 116/70   Temp    Pulse 66 06/03/20 1722  Resp 17 06/03/20 1722  SpO2 97 % 06/03/20 1722  Vitals shown include unvalidated device data.  Last Pain:  Vitals:   06/03/20 1356  TempSrc: Oral  PainSc:       Patients Stated Pain Goal: 4 (54/86/28 2417)  Complications: No complications documented.

## 2020-06-03 NOTE — Progress Notes (Addendum)
Initial Nutrition Assessment  DOCUMENTATION CODES:   Obesity unspecified  INTERVENTION:  - will order Ensure Surgery once/day, each supplement provides 330 kcal and 18 grams protein. - will order 30 ml Prosource Plus TID, each supplement provides 100 kcal and 15 grams protein.  - will complete NFPE at follow-up. - likely need for DM diet education prior to d/c.   NUTRITION DIAGNOSIS:   Increased nutrient needs related to post-op healing, wound healing as evidenced by estimated needs.  GOAL:   Patient will meet greater than or equal to 90% of their needs  MONITOR:   PO intake, Supplement acceptance, Labs, Weight trends  REASON FOR ASSESSMENT:   Consult Wound healing  ASSESSMENT:   64 year old male with a medical history of obesity, OSA, HTN, HLD, type 2 DM, and ESRD s/p renal transplant in 2006 on immunosuppressants.  He presented to the ED due to a worsening diabetic pressure ulcer on the bottom of L foot x1-2 weeks.  He is also noted to have R foot poorly healing pressure injury.  Patient is currently out of the room to MRI with plans to  Then go to [facility] for L BKA with Dr. Sharol Given.   He ate 100% of dinner on 11/1 (392 kcal, 18 grams protein); 100% of breakfast and 100% of lunch (total of 915 kcal, 46 grams protein) and dinner not documented 11/2.   He was made NPO at midnight and is now CLD until time of BKA. WOC saw patient yesterday AM and has signed off/will not be following.   DM Coordinator saw patient yesterday AM and in her note indicated HgbA1c of 9.3%.  Weight 11/1 was 246 lb and weight has been stable since 02/21/20 and up since 12/21/19. No information documented in the edema section of flow sheet.     Labs reviewed; CBG: 274 mg/dl, Na: 132 mmol/l, BUN: 41 mg/dl, creatinine: 2.22 mg/dl, Ca: 8.3 mg/dl, GFR: 33 ml/min. Medications reviewed; PRN lasix, sliding scale novolog, 3 units novolog TID, 10 units levemir/day, 2 g IV Mg sulfate x1 run 11/3, 40 mg oral  protonix/day, 1 mg prograf BID.   IVF; NS @ 50 ml/hr.    NUTRITION - FOCUSED PHYSICAL EXAM:  unable to complete at this time.   Diet Order:   Diet Order            Diet clear liquid Room service appropriate? Yes; Fluid consistency: Thin  Diet effective now                 EDUCATION NEEDS:   Not appropriate for education at this time  Skin:  Skin Assessment: Skin Integrity Issues: Skin Integrity Issues:: Diabetic Ulcer Diabetic Ulcer: L foot  Last BM:  11/1  Height:   Ht Readings from Last 1 Encounters:  06/01/20 5\' 11"  (1.803 m)    Weight:   Wt Readings from Last 1 Encounters:  06/01/20 111.5 kg    Estimated Nutritional Needs:  Kcal:  2200-2400 kcal Protein:  110-120 grams Fluid:  >/= 2.5 L/day      Jarome Matin, MS, RD, LDN, CNSC Inpatient Clinical Dietitian RD pager # available in AMION  After hours/weekend pager # available in [facility]

## 2020-06-03 NOTE — Anesthesia Preprocedure Evaluation (Addendum)
Anesthesia Evaluation  Patient identified by MRN, date of birth, ID band Patient awake    Reviewed: Allergy & Precautions, NPO status , [name] Chart, lab work & pertinent test results  Airway Mallampati: III  TM Distance: >3 FB Neck ROM: Full    Dental  (+) Poor Dentition, Missing, Chipped   Pulmonary sleep apnea and Continuous Positive Airway Pressure Ventilation ,    Pulmonary exam normal breath sounds clear to auscultation       Cardiovascular hypertension, Normal cardiovascular exam Rhythm:Regular Rate:Normal     Neuro/Psych  Neuromuscular disease negative psych ROS   GI/Hepatic Neg liver ROS, GERD  Medicated and Controlled,  Endo/Other  diabetes, Poorly Controlled, Insulin Dependent  Renal/GU CRFRenal disease     Musculoskeletal negative musculoskeletal ROS (+)   Abdominal (+) + obese,   Peds  Hematology  (+) anemia , Hyperlipidemia   Anesthesia Other Findings Left Foot Osteomyelitis  Reproductive/Obstetrics                           Anesthesia Physical Anesthesia Plan  ASA: III  Anesthesia Plan: Regional and General   Post-op Pain Management:    Induction: Intravenous  PONV Risk Score and Plan: 2 and Ondansetron, Dexamethasone, Midazolam and Treatment may vary due to age or medical condition  Airway Management Planned: LMA  Additional Equipment:   Intra-op Plan:   Post-operative Plan: Extubation in OR  Informed Consent: I have reviewed the patients History and Physical, chart, labs and discussed the procedure including the risks, benefits and alternatives for the proposed anesthesia with the patient or authorized representative who has indicated his/her understanding and acceptance.     Dental advisory given  Plan Discussed with: CRNA  Anesthesia Plan Comments:        Anesthesia Quick Evaluation

## 2020-06-03 NOTE — Op Note (Signed)
   Date of Surgery: 06/03/2020  INDICATIONS: Steven [name] is a 64 y.o.-year-old male who has osteomyelitis ulceration and abscess left foot.  Patient has failed conservative treatment and presents at this time for transtibial amputation.Marland Kitchen  PREOPERATIVE DIAGNOSIS: Osteomyelitis abscess ulceration left foot  POSTOPERATIVE DIAGNOSIS: Same.  PROCEDURE: Transtibial amputation Application of Prevena wound VAC  SURGEON: Sharol Given, M.D.  ANESTHESIA:  general  IV FLUIDS AND URINE: See anesthesia.  ESTIMATED BLOOD LOSS: 200 mL.  COMPLICATIONS: None.  DESCRIPTION OF PROCEDURE: The patient was brought to the operating room and underwent a general anesthetic. After adequate levels of anesthesia were obtained patient's lower extremity was prepped using DuraPrep draped into a sterile field. A timeout was called. The foot was draped out of the sterile field with impervious stockinette. A transverse incision was made 11 cm distal to the tibial tubercle. This curved proximally and a large posterior flap was created. The tibia was transected 1 cm proximal to the skin incision. The fibula was transected just proximal to the tibial incision. The tibia was beveled anteriorly. A large posterior flap was created. The sciatic nerve was pulled cut and allowed to retract. The vascular bundles were suture ligated with 2-0 silk. The deep and superficial fascial layers were closed using #1 Vicryl. The skin was closed using staples and 2-0 nylon. The wound was covered with a Prevena wound VAC. There was a good suction fit. A prosthetic shrinker was applied. Patient was extubated taken to the PACU in stable condition.   DISCHARGE PLANNING:  Antibiotic duration: 24 hours  Weightbearing: Nonweightbearing on the left  Pain medication: Opioid pathway  Dressing care/ Wound VAC: Continue wound VAC for 1 week  Discharge to: Discharge planning based on physical therapy recommendations.  Follow-up: In the office 1 week post  operative.  Meridee Score, MD [place] 5:19 PM

## 2020-06-03 NOTE — Progress Notes (Signed)
Orthopedic Tech Progress Note Patient Details:  [name] May 26, 1956 [id number] Vive Protocol BK has been ordered Patient ID: [name], male   DOB: 1956/06/27, 64 y.o.   MRN: [id number]   Jearld Lesch 06/03/2020, 8:14 PM

## 2020-06-03 NOTE — Anesthesia Procedure Notes (Signed)
Anesthesia Regional Block: Adductor canal block   Pre-Anesthetic Checklist: ,, timeout performed, Correct Patient, Correct Site, Correct Laterality, Correct Procedure,, site marked, risks and benefits discussed, Surgical consent,  Pre-op evaluation,  At surgeon's request and post-op pain management  Laterality: Left  Prep: chloraprep       Needles:  Injection technique: Single-shot  Needle Type: Echogenic Stimulator Needle     Needle Length: 10cm  Needle Gauge: 20     Additional Needles:   Procedures:,,,, ultrasound used (permanent image in chart),,,,  Narrative:  Start time: 06/03/2020 4:10 PM End time: 06/03/2020 4:20 PM Injection made incrementally with aspirations every 5 mL.  Performed by: Personally  Anesthesiologist: Murvin Natal, MD  Additional Notes: Functioning IV was confirmed and monitors were applied. A time-out was performed. Hand hygiene and sterile gloves were used. The thigh was placed in a frog-leg position and prepped in a sterile fashion. A 150mm 20ga BBraun echogenic stimulator needle was placed using ultrasound guidance.  Negative aspiration and negative test dose prior to incremental administration of local anesthetic. The patient tolerated the procedure well.

## 2020-06-03 NOTE — Anesthesia Procedure Notes (Signed)
Anesthesia Regional Block: Popliteal block   Pre-Anesthetic Checklist: ,, timeout performed, Correct Patient, Correct Site, Correct Laterality, Correct Procedure,, site marked, risks and benefits discussed, Surgical consent,  Pre-op evaluation,  At surgeon's request and post-op pain management  Laterality: Left  Prep: chloraprep       Needles:  Injection technique: Single-shot  Needle Type: Echogenic Stimulator Needle     Needle Length: 10cm  Needle Gauge: 20     Additional Needles:   Procedures:, nerve stimulator,,, ultrasound used (permanent image in chart),,,,   Nerve Stimulator or Paresthesia:  Response: Foot dorsiflexion and plantarflexion , 0.4 mA,   Additional Responses:   Narrative:  Start time: 06/03/2020 4:00 PM End time: 06/03/2020 4:10 PM Injection made incrementally with aspirations every 5 mL.  Performed by: Personally  Anesthesiologist: Murvin Natal, MD  Additional Notes: Functioning IV was confirmed and monitors were applied.  A 146mm 20ga BBraun echogenic stimulator needle was used. Sterile prep and drape,hand hygiene and sterile gloves were used.  Negative aspiration and negative test dose prior to incremental administration of local anesthetic. The patient tolerated the procedure well.

## 2020-06-04 ENCOUNTER — Encounter (HOSPITAL_COMMUNITY): Payer: Self-pay | Admitting: Orthopedic Surgery

## 2020-06-04 ENCOUNTER — Inpatient Hospital Stay (HOSPITAL_COMMUNITY): Payer: BC Managed Care – PPO

## 2020-06-04 DIAGNOSIS — I340 Nonrheumatic mitral (valve) insufficiency: Secondary | ICD-10-CM | POA: Diagnosis not present

## 2020-06-04 DIAGNOSIS — I352 Nonrheumatic aortic (valve) stenosis with insufficiency: Secondary | ICD-10-CM | POA: Diagnosis not present

## 2020-06-04 DIAGNOSIS — R011 Cardiac murmur, unspecified: Secondary | ICD-10-CM | POA: Diagnosis not present

## 2020-06-04 DIAGNOSIS — I342 Nonrheumatic mitral (valve) stenosis: Secondary | ICD-10-CM | POA: Diagnosis not present

## 2020-06-04 LAB — BASIC METABOLIC PANEL
Anion gap: 12 (ref 5–15)
BUN: 36 mg/dL — ABNORMAL HIGH (ref 8–23)
CO2: 19 mmol/L — ABNORMAL LOW (ref 22–32)
Calcium: 8.8 mg/dL — ABNORMAL LOW (ref 8.9–10.3)
Chloride: 99 mmol/L (ref 98–111)
Creatinine, Ser: 2.41 mg/dL — ABNORMAL HIGH (ref 0.61–1.24)
GFR, Estimated: 30 mL/min — ABNORMAL LOW (ref >60)
Glucose, Bld: 444 mg/dL — ABNORMAL HIGH (ref 70–99)
Potassium: 5.3 mmol/L — ABNORMAL HIGH (ref 3.5–5.1)
Sodium: 130 mmol/L — ABNORMAL LOW (ref 135–145)

## 2020-06-04 LAB — CBC
HCT: 31.1 % — ABNORMAL LOW (ref 39.0–52.0)
Hemoglobin: 9.8 g/dL — ABNORMAL LOW (ref 13.0–17.0)
MCH: 25.6 pg — ABNORMAL LOW (ref 26.0–34.0)
MCHC: 31.5 g/dL (ref 30.0–36.0)
MCV: 81.2 fL (ref 80.0–100.0)
Platelets: 166 10*3/uL (ref 150–400)
RBC: 3.83 MIL/uL — ABNORMAL LOW (ref 4.22–5.81)
RDW: 13.8 % (ref 11.5–15.5)
WBC: 5.9 10*3/uL (ref 4.0–10.5)
nRBC: 0 % (ref 0.0–0.2)

## 2020-06-04 LAB — GLUCOSE, CAPILLARY
Glucose-Capillary: 387 mg/dL — ABNORMAL HIGH (ref 70–99)
Glucose-Capillary: 493 mg/dL — ABNORMAL HIGH (ref 70–99)
Glucose-Capillary: 224 mg/dL — ABNORMAL HIGH (ref 70–99)

## 2020-06-04 LAB — ECHOCARDIOGRAM COMPLETE
AR max vel: 4.52 cm2
AV Area VTI: 4.16 cm2
AV Area mean vel: 4.45 cm2
AV Mean grad: 67 mmHg
AV Peak grad: 108.4 mmHg
Ao pk vel: 5.21 m/s
Area-P 1/2: 2.37 cm2
Calc EF: 69 %
Height: 70.984 in
S' Lateral: 3.7 cm
Single Plane A2C EF: 76.5 %
Single Plane A4C EF: 57.3 %
Weight: 3933.01 oz

## 2020-06-04 LAB — GLUCOSE, RANDOM: Glucose, Bld: 433 mg/dL — ABNORMAL HIGH (ref 70–99)

## 2020-06-04 LAB — BRAIN NATRIURETIC PEPTIDE: B Natriuretic Peptide: 1403.3 pg/mL — ABNORMAL HIGH (ref 0.0–100.0)

## 2020-06-04 MED ORDER — PERFLUTREN LIPID MICROSPHERE
1.0000 mL | INTRAVENOUS | Status: AC | PRN
  Administered 2020-06-04: 2 mL via INTRAVENOUS
  Filled 2020-06-04: qty 10

## 2020-06-04 MED ORDER — FUROSEMIDE 10 MG/ML IJ SOLN
40.0000 mg | Freq: Once | INTRAMUSCULAR | Status: AC
  Administered 2020-06-04: 40 mg via INTRAVENOUS
  Filled 2020-06-04: qty 4

## 2020-06-04 MED ORDER — INSULIN DETEMIR 100 UNIT/ML ~~LOC~~ SOLN
15.0000 [IU] | Freq: Every day | SUBCUTANEOUS | Status: DC
  Administered 2020-06-04 – 2020-06-10 (×7): 15 [IU] via SUBCUTANEOUS
  Filled 2020-06-04 (×7): qty 0.15

## 2020-06-04 MED ORDER — INSULIN ASPART 100 UNIT/ML ~~LOC~~ SOLN
10.0000 [IU] | Freq: Once | SUBCUTANEOUS | Status: AC
  Administered 2020-06-04: 10 [IU] via SUBCUTANEOUS

## 2020-06-04 MED ORDER — GLYBURIDE 5 MG PO TABS
5.0000 mg | ORAL_TABLET | Freq: Two times a day (BID) | ORAL | Status: DC
  Administered 2020-06-04 – 2020-06-10 (×11): 5 mg via ORAL
  Filled 2020-06-04 (×13): qty 1

## 2020-06-04 MED ORDER — INSULIN ASPART 100 UNIT/ML ~~LOC~~ SOLN
6.0000 [IU] | Freq: Three times a day (TID) | SUBCUTANEOUS | Status: DC
  Administered 2020-06-04 – 2020-06-10 (×16): 6 [IU] via SUBCUTANEOUS

## 2020-06-04 NOTE — Evaluation (Signed)
Physical Therapy Evaluation Patient Details Name: Steven [name] MRN: [id number] DOB: [birth year] Today's Date: 06/04/2020   History of Present Illness  Pt is a 64 y.o. male admitted 06/01/20 with worsening L foot diabetic pressure ulcer. 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 PMH includes obesity, OSA, HTN, DM2, ESRD (s/p renal transplant), HLD.    Clinical Impression  Pt presents with an overall decrease in functional mobility secondary to above. PTA, pt independent, active and has supportive family. Educ on precautions, positioning, limb guard wear, therex, and importance of mobility. Today, pt able to initiate transfer and gait training with RW; required up to modA to prevent LOB with standing activity. Pt would benefit from intensive CIR-level therapies to maximize functional mobility and independence prior to return home; pt motivated to return to PLOF.     Follow Up Recommendations CIR;Supervision for mobility/OOB    Equipment Recommendations   (TBD)    Recommendations for Other Services       Precautions / Restrictions Precautions Precautions: Fall;Other (comment) Precaution Comments: L BKA wound back; watch SpO2/HR Restrictions Weight Bearing Restrictions: Yes LLE Weight Bearing: Non weight bearing      Mobility  Bed Mobility               General bed mobility comments: Received sitting on toilet with OT; left seated in recliner    Transfers Overall transfer level: Needs assistance Equipment used: Rolling walker (2 wheeled) Transfers: Sit to/from Stand Sit to Stand: Min assist;Mod assist         General transfer comment: Consistent min-modA for maintaining balance with trunk elevation and transitioning UE support to RW  Ambulation/Gait Ambulation/Gait assistance: Min assist;Mod assist Gait Distance (Feet): 16 Feet Assistive device: Rolling walker (2 wheeled)   Gait velocity: Decreased   General Gait Details: Slow, fatigued gait with RW; heavy  reliance on BUE support to hop on RLE; instability requiring min-modA to prevent posterior LOB; 3x standing rest breaks due to fatigue and SOB  Stairs            Wheelchair Mobility    Modified Rankin (Stroke Patients Only)       Balance Overall balance assessment: Needs assistance   Sitting balance-Leahy Scale: Good     Standing balance support: Bilateral upper extremity supported Standing balance-Leahy Scale: Poor Standing balance comment: Dependent on BUE support                             Pertinent Vitals/Pain Pain Assessment: No/denies pain    Home Living Family/patient expects to be discharged to:: Private residence Living Arrangements: Alone Available Help at Discharge: Family;Available 24 hours/day Type of Home: House Home Access: Stairs to enter Entrance Stairs-Rails: None Entrance Stairs-Number of Steps: 1 threshold (1.5" high) Home Layout: One level Home Equipment: Grab bars - toilet;Grab bars - tub/shower      Prior Function Level of Independence: Independent         Comments: Very active, teaches paramedic classes. Likes hunting, going to ball games. Retired Pensions consultant   Dominant Hand: Right    Extremity/Trunk Assessment   Upper Extremity Assessment Upper Extremity Assessment: Overall WFL for tasks assessed    Lower Extremity Assessment Lower Extremity Assessment: LLE deficits/detail LLE Deficits / Details: s/p L BKA; hip and knee functionally at least 3/5       Communication   Communication: No difficulties  Cognition Arousal/Alertness: Awake/alert Behavior During Therapy: [facility]  for tasks assessed/performed Overall Cognitive Status: Within Functional Limits for tasks assessed                                        General Comments General comments (skin integrity, edema, etc.): HR up to 130s with mobility. DOE 3/4 with activity, but SpO2 >/92% on RA. [employer] rep present during  session to provide shrinker/limb guard    Exercises Other Exercises Other Exercises: [employer] HEP handout (Access Code [id number]) provided and practiced - quad set, knee flex/ext, SLR, hip abd/add   Assessment/Plan    PT Assessment Patient needs continued PT services  PT Problem List Decreased strength;Decreased activity tolerance;Decreased balance;Decreased mobility;Decreased knowledge of use of DME;Decreased knowledge of precautions;Cardiopulmonary status limiting activity       PT Treatment Interventions DME instruction;Gait training;Stair training;Functional mobility training;Therapeutic activities;Therapeutic exercise;Balance training;Patient/family education;Wheelchair mobility training    PT Goals (Current goals can be found in the Care Plan section)  Acute Rehab PT Goals Patient Stated Goal: to be able to be active again to watch son play competitive baseball PT Goal Formulation: With patient Time For Goal Achievement: 06/18/20 Potential to Achieve Goals: Good    Frequency Min 3X/week   Barriers to discharge        Co-evaluation               AM-PAC PT "6 Clicks" Mobility  Outcome Measure Help needed turning from your back to your side while in a flat bed without using bedrails?: A Little Help needed moving from lying on your back to sitting on the side of a flat bed without using bedrails?: A Little Help needed moving to and from a bed to a chair (including a wheelchair)?: A Little Help needed standing up from a chair using your arms (e.g., wheelchair or bedside chair)?: A Lot Help needed to walk in hospital room?: A Lot Help needed climbing 3-5 steps with a railing? : Total 6 Click Score: 14    End of Session Equipment Utilized During Treatment: Gait belt Activity Tolerance: Patient tolerated treatment well Patient left: in chair;with call bell/phone within reach Nurse Communication: Mobility status PT Visit Diagnosis: Other abnormalities of gait and  mobility (R26.89);Muscle weakness (generalized) (M62.81)    Time: 7001-7494 PT Time Calculation (min) (ACUTE ONLY): 32 min   Charges:   PT Evaluation $PT Eval Moderate Complexity: 1 Mod PT Treatments $Therapeutic Activity: 8-22 mins   Mabeline Caras, PT, DPT Acute Rehabilitation Services  Pager [phone] Office [phone]  Derry Lory 06/04/2020, 12:40 PM

## 2020-06-04 NOTE — Plan of Care (Signed)
  Problem: Education: Goal: Knowledge of General Education information will improve Description: Including pain rating scale, medication(s)/side effects and non-pharmacologic comfort measures Outcome: Progressing   Problem: Health Behavior/Discharge Planning: Goal: Ability to manage health-related needs will improve Outcome: Progressing   Problem: Clinical Measurements: Goal: Ability to maintain clinical measurements within normal limits will improve Outcome: Progressing Goal: Will remain free from infection Outcome: Progressing Goal: Diagnostic test results will improve Outcome: Progressing Goal: Respiratory complications will improve Outcome: Progressing Goal: Cardiovascular complication will be avoided Outcome: Progressing   Problem: Activity: Goal: Risk for activity intolerance will decrease Outcome: Progressing   Problem: Nutrition: Goal: Adequate nutrition will be maintained Outcome: Progressing   Problem: Coping: Goal: Level of anxiety will decrease Outcome: Progressing   Problem: Elimination: Goal: Will not experience complications related to urinary retention Outcome: Progressing   Problem: Safety: Goal: Ability to remain free from injury will improve Outcome: Progressing   Problem: Skin Integrity: Goal: Risk for impaired skin integrity will decrease Outcome: Progressing

## 2020-06-04 NOTE — Progress Notes (Signed)
Inpatient Rehab Admissions Coordinator Note:   Per therapy recommendations, pt was screened for CIR candidacy by Shann Medal, PT, DPT.  At this time we are recommending a CIR consult. I will place an order per our protocol.  Please contact me with questions.   Shann Medal, PT, DPT [phone] 06/04/20 4:00 PM

## 2020-06-04 NOTE — Progress Notes (Signed)
Inpatient Diabetes Program Recommendations  AACE/ADA: New Consensus Statement on Inpatient Glycemic Control (2015)  Target Ranges:  Prepandial:   less than 140 mg/dL      Peak postprandial:   less than 180 mg/dL (1-2 hours)      Critically ill patients:  140 - 180 mg/dL   Lab Results  Component Value Date   GLUCAP 387 (H) 06/04/2020   HGBA1C 9.3 (H) 06/01/2020    Review of Glycemic Control Results for Steven [name], Steven Steven [name] (MRN [id number]) as of 06/04/2020 11:43  Ref. Range 06/03/2020 07:20 06/03/2020 11:46 06/03/2020 14:48 06/03/2020 17:24 06/04/2020 07:41  Glucose-Capillary Latest Ref Range: 70 - 99 mg/dL 274 (H) Novolog 8 units  283 (H) 187 (H) 176 (H) 387 (H)   Diabetes history: DM2 Outpatient Diabetes medications: Levemir 10 units QD, Novolog 0-30 units tidwc, glyburide 5 mg bid Current orders for Inpatient glycemic control: Levemir 10 units QHS, Novolog 0-15 units tidwc  Inpatient Diabetes Program Recommendations:   While oral DM medications held, consider: -Novolog 5 units tid meal coverage if eats 50% -Add Novolog 0-5 hs correction -Decrease Novolog to sensitive 0-9 units tid -Increase Levemir to 10 units bid Secure chat sent to Dr. Waldron [facility].   Thank you, Steven Steven [name]. Steven Julian, RN, MSN, CDE  Diabetes Coordinator Inpatient Glycemic Control Team Team Pager [phone] (8am-5pm) 06/04/2020 11:47 AM

## 2020-06-04 NOTE — Progress Notes (Signed)
  Echocardiogram 2D Echocardiogram has been performed.  Steven [name] 06/04/2020, 4:07 PM

## 2020-06-04 NOTE — Evaluation (Addendum)
Occupational Therapy Re-Evaluation Patient Details Name: Steven [name] MRN: [id number] DOB: 03-Mar-1956 Today's Date: 06/04/2020    History of Present Illness Pt is a 64 y.o. male admitted 06/01/20 with worsening L foot diabetic pressure ulcer. 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 PMH includes obesity, OSA, HTN, DM2, ESRD (s/p renal transplant), HLD.   Clinical Impression   Pt seen for OT re-evaluation today s/p L BKA. Nerve block still in effect, but pt beginning to feel tingling sensations in residual limb. Pt presents with deficits in balance, endurance, strength impacting ability to complete ADLs/mobility without physical assistance. Pt overall Min A for short mobility to/from bathroom with RW, but requires Min A to Mod A to correct multiple LOB.  Pt fatigued after short mobility, SpO2 WFL and HR<135bpm. Pt requires Setup for UB ADLs and Mod A for LB ADLs secondary to deficits. Pt motivated to regain independence and able to implement safety techniques with fair carryover. Plan to address LB ADLs using compensatory strategies during next session. Recommend CIR for intensive therapies to maximize independence with ADLs, IADLs and mobility.     Follow Up Recommendations  CIR;Supervision/Assistance - 24 hour    Equipment Recommendations  3 in 1 bedside commode;Wheelchair (measurements OT);Wheelchair cushion (measurements OT);Other (comment);Tub/shower bench (RW)    Recommendations for Other Services Rehab consult     Precautions / Restrictions Precautions Precautions: Fall;Other (comment) Precaution Comments: L BKA wound back; watch SpO2/HR Restrictions Weight Bearing Restrictions: Yes LLE Weight Bearing: Non weight bearing      Mobility Bed Mobility               General bed mobility comments: Received in chair    Transfers Overall transfer level: Needs assistance Equipment used: Rolling walker (2 wheeled) Transfers: Sit to/from [facility] Sit to  Stand: Min assist Stand pivot transfers: Mod assist       General transfer comment: Pt Min A for steadying in transition to walker. Varied from [place] A to Mod A for stand pivot with assistance to correct LOB and manuever RW    Balance Overall balance assessment: Needs assistance Sitting-balance support: Feet supported Sitting balance-Leahy Scale: Good     Standing balance support: Bilateral upper extremity supported Standing balance-Leahy Scale: Poor Standing balance comment: dependent on BUE support while maintaining NWB on LLE                           ADL either performed or assessed with clinical judgement   ADL Overall ADL's : Needs assistance/impaired Eating/Feeding: Independent   Grooming: Minimal assistance;Standing   Upper Body Bathing: Set up;Sitting   Lower Body Bathing: Moderate assistance;Sit to/from stand;Sitting/lateral leans   Upper Body Dressing : Set up;Sitting   Lower Body Dressing: Moderate assistance;Sitting/lateral leans;Sit to/from stand   Toilet Transfer: Minimal assistance;Ambulation;BSC;RW Toilet Transfer Details (indicate cue type and reason): Min A for mobility to/from bathroom with RW, cueing for RW sequencing with Min A to [place]  to correct LOB when turning and managing incline/decline into bathroom Toileting- Clothing Manipulation and Hygiene: Moderate assistance;Sit to/from stand;Sitting/lateral lean       Functional mobility during ADLs: Minimal assistance;Cueing for sequencing;Cueing for safety;Rolling walker General ADL Comments: Pt with deficits in balance, endurance. Fatigues quickly. Anticipate pain to be a limiting factor when nerve block wears off     Vision Baseline Vision/History: No visual deficits Patient Visual Report: No change from baseline Vision Assessment?: No apparent visual deficits     Perception  Praxis      Pertinent Vitals/Pain Pain Assessment: No/denies pain (tingling beginning, nerve block)      Hand Dominance Right   Extremity/Trunk Assessment Upper Extremity Assessment Upper Extremity Assessment: Overall WFL for tasks assessed   Lower Extremity Assessment Lower Extremity Assessment: Defer to PT evaluation   Cervical / Trunk Assessment Cervical / Trunk Assessment: Other exceptions Cervical / Trunk Exceptions: obesity   Communication Communication Communication: No difficulties   Cognition Arousal/Alertness: Awake/alert Behavior During Therapy: WFL for tasks assessed/performed Overall Cognitive Status: Within Functional Limits for tasks assessed                                     General Comments  Pt on RA (rapid response and SOB overnight with desats). SpO2 WFL during session but dyspnea noted with activity. HR up to 130s during activities. Normalized with seated rest break    Exercises     Shoulder Instructions      Home Living Family/patient expects to be discharged to:: Private residence Living Arrangements: Alone Available Help at Discharge: Family;Available 24 hours/day Type of Home: House Home Access: Stairs to enter [employer] of Steps: 1 threshold (inch and a half high) Entrance Stairs-Rails: None Home Layout: One level     Bathroom Shower/Tub: Occupational psychologist: Handicapped height Bathroom Accessibility: Yes How Accessible: Accessible via wheelchair Home Equipment: Grab bars - toilet;Grab bars - tub/shower          Prior Functioning/Environment Level of Independence: Independent        Comments: Very active, teaches paramedic classes. Likes hunting, going to ball games        OT Problem List: Impaired balance (sitting and/or standing);Decreased safety awareness;Decreased knowledge of use of DME or AE;Obesity;Decreased strength;Decreased activity tolerance;Pain      OT Treatment/Interventions: Self-care/ADL training;DME and/or AE instruction;Therapeutic exercise;Therapeutic  activities;Patient/family education;Balance training;Energy conservation    OT Goals(Current goals can be found in the care plan section) Acute Rehab OT Goals Patient Stated Goal: to be able to be active again to watch son play competitive baseball OT Goal Formulation: With patient Time For Goal Achievement: 06/18/20 Potential to Achieve Goals: Good ADL Goals Pt Will Perform Grooming: with modified independence;standing  OT Frequency: Min 2X/week   Barriers to D/C:            Co-evaluation              AM-PAC OT "6 Clicks" Daily Activity     Outcome Measure Help from another person eating meals?: None Help from another person taking care of personal grooming?: A Little Help from another person toileting, which includes using toliet, bedpan, or urinal?: A Lot Help from another person bathing (including washing, rinsing, drying)?: A Lot Help from another person to put on and taking off regular upper body clothing?: A Little Help from another person to put on and taking off regular lower body clothing?: A Lot 6 Click Score: 16   End of Session Equipment Utilized During Treatment: Gait belt;Rolling walker Nurse Communication: Mobility status  Activity Tolerance: Patient tolerated treatment well Patient left: in chair;with call bell/phone within reach;Other (comment) (with PT )  OT Visit Diagnosis: Unsteadiness on feet (R26.81);Other abnormalities of gait and mobility (R26.89);Pain;Muscle weakness (generalized) (M62.81) Pain - Right/Left: Left Pain - part of body: Leg                Time: [phone]  OT Time Calculation (min): 40 min Charges:  OT General Charges $OT Visit: 1 Visit OT Evaluation $OT Re-eval: 1 Re-eval OT Treatments $Self Care/Home Management : 8-22 mins  Layla Maw, OTR/L  Layla Maw 06/04/2020, 12:26 PM

## 2020-06-04 NOTE — Progress Notes (Signed)
Dr Waldron Labs notified via secure chat message in regards to high blood glucose fingerstick.  The patient denies any problems or complaints.  The patient reports that he did have some orange juice and oatmeal earlier.

## 2020-06-04 NOTE — Significant Event (Addendum)
Rapid Response Event Note   Reason for Call :  SOB, SpO2-82% on RA.  Per RN, pt has been on RA all night with SpO2-100% and clear lung sounds. Pt as been using his incentive spirometer.  Pt pt, he developed sudden onset 8/10 midsternal chest pressure followed by inability to catch his breath. Pt's SpO2 dropped to 82% on RA and lungs with rhonchi t/o. Pt has also developed a cough.   Initial Focused Assessment:  Pt laying in bed with eyes open, alert and oriented, in respiratory distress. Pt is c/o SOB, denies chest pain on my assessment, however, did have chest pressure prior to his SOB. Pt unable to speak in full sentences. Pt keeps sitting up on side of the bed because he says he can't breathe. Lungs with rhonchi t/o. Skin warm and diaphoretic. Pt has L BKA with wound vac.   HR-124, SBP-180s, RR-40s, SpO2-98% on NRB.   Interventions:  NRB-100% PCXR-Vascular congestion and perihilar opacities with interstitial prominence may reflect edema or infection. EKG-ST, L ventricular hypertrophy with repolarization abnormality(R IN aVL) Lasix 40 mg IV x 1   Plan of Care:  Pt breathing a lot better now, back on RA-92%. He still says he's a little SOB and still looks a little labored. However, he is able to speak in complete sentences and he says his chest pressure has resolved. Pt HR now 90s. Give lasix and monitor response. Continue to monitor pt. Call RRT if further assistance needed.    Event Summary:   MD Notified: Dr. Cyd Silence @ 0400   Call [place] Time:0410  Dillard Essex, RN

## 2020-06-04 NOTE — Progress Notes (Signed)
RN called to pt room by charge.  Pt called out for difficulty breathing.    Pt appears to be in acute distress: O2 sats 82% on room air, HR in 120s-130s, diaphoretic, congested cough.  Respiratory rate in high 20s, accessory muscle use and increased WOB.  Lungs with rhonchi in all lung fields.  Placed on re-breather at 10L,sat pt upright, VS taken (see Epic).  No PRN meds ordered addressing the above.  Pt sats returned to normal on 10L, HR still elevated into 1-teens and twenties.  Pt anxiety level high.  Rapid Response RN on the unit for a different patient.  I requested assistance.   Rapid Response RN to bedside.  Stat CXR and EKG ordered and completed.  O2 increased to 15L. Pt respiratory status slowly improved.  O2 decreased to 10L.    RRT RN obtained order for 40 lasix.  Pt placed on [place] at 2L for comfort/reassurance.  HR at 100-110.  IV Lasix administered over 10 minutes d/t transplant status.  At pt request, assisted him to transfer to recliner. VSS stable, pt in no acute distress.

## 2020-06-04 NOTE — Progress Notes (Signed)
Dr [facility] was sent message via secure Amion message in regards to blood glucose. Awaiting lab glucose.  The patient reports that he normally takes his levemer injection during the day but it has been scheduled for pm

## 2020-06-04 NOTE — Progress Notes (Signed)
Patient is postop day one status post below-knee amputation.  He is sitting up today.  He did say that he had acute onset of substernal chest heaviness and shortness of breath last night.  Apparently rapid response was called.  With regards to his leg he feels good and denies any pain  Wound VAC is in place is functioning 0 cc in the canister to black x  Patient will work with physical therapy today.  Disposition pending their evaluation today.

## 2020-06-04 NOTE — Progress Notes (Signed)
Patient sitting in recliner- tolerated therapy well.  No co chest pain or shortness of breath.  Oxygen has been removed- the patient maintains oxygen saturation @ 96-100 RA

## 2020-06-04 NOTE — CV Procedure (Signed)
2D echo attempted, but patient in chair. Will try later. 

## 2020-06-04 NOTE — Progress Notes (Signed)
HOSPITAL MEDICINE OVERNIGHT EVENT NOTE    Notified by rapid response that patient has developed substantial shortness of breath and tachypnea this evening.  No evidence of hypoxia.  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  Rapid response has already obtained an EKG which reveals tachycardia but no dynamic ST segment change.  Chest x-ray reveals patchy bilateral infiltrates concerning for pulmonary edema.  Additionally, rapid response nursing notes the patient developed substantial shortness of breath when lying him flat as opposed to propping him up.  No echo on file however I am concerned patient may be suffering from acute pulmonary edema.  Will provide patient with a trial dose of 40 mg of IV Lasix and monitor for clinical improvement.  If patient fails to clinically improve with Lasix will consider alternative etiologies such as acute thromboembolism.  Vernelle Emerald  MD [facility]

## 2020-06-04 NOTE — Progress Notes (Addendum)
PROGRESS NOTE    Steven [name]  DGU:[id number] DOB: [birth year] DOA: 06/01/2020 PCP: Isaac Bliss, Rayford Halsted, MD    Brief Narrative:  64 year old male with a past medical history of obesity, OSA, hypertension, hyperlipidemia, type 2 diabetes ESRD s/p renal transplant in 2006 on immunosuppressants presented to [facility] ED due to a worsening diabetic pressure ulcer on the bottom of his left foot of 1 to 2 weeks duration. He initially took Keflex without improvement and was switched to Doxy.  No improvement.  Presented to the ED for further evaluation and management of his wound.  Work up revealed left foot osteomyelitis by MRI.  Subjective: - he had an episode of dyspnea overnight, improved with IV Lasix.  Assessment & Plan:   Principal Problem:   Osteomyelitis of foot ([place]) Active Problems:   Diabetes mellitus ([facility])   Hyperlipidemia   Kidney transplant recipient   Open wound of left foot   Diabetic foot infection ([place])   CKD (chronic kidney disease), stage III (HCC)  Osteomyelitis of the left foot with cellulitis, failed outpatient antibiotics -Patient initially on broad-spectrum IV antibiotics, IV vancomycin and cefepime, vancomycin later changed to IV linezolid given AKI . -To finish antibiotics another 24 hours post surgery, can be discontinued today . - MRI left foot is consistent with osteomyelitis. -Orthopedic input greatly appreciated, status post tibial amputation by Dr. Sharol Given on 11/3.  . -PT/OT consulted.  .  Non oliguric AKI on CKD 3B status post renal transplant on immunosuppressants likely prerenal in the setting of dehydration -Continue with immunosuppressive therapy. -Creatinine at baseline, continue to monitor closely as we will need IV diuresis. - Nephrology has signed off  Acute CHF/unspecified -Patient with volume overload evidence of chest x-ray, elevated BNP overnight, improved with IV Lasix, will check 2D echo, I have stopped IV fluids,   Nonhealing  bilateral lower extremity diabetic foot wound Continue local wound care Orthopedic Surgery following, plan for surgery today  Type 2 diabetes on insulin a1c of 9.3 -CBG significantly elevated, added Levemir 15 units daily, and 6 units before meals.  Hyperlipidemia/GERD Continue home regimen  Physical debility PT/OT consulted.   DVT prophylaxis: Heparin subq Code Status: Full Family Communication: none at bedside  Status is: Inpatient  Remains inpatient appropriate because:Ongoing diagnostic testing needed not appropriate for outpatient work up and Inpatient level of care appropriate due to severity of illness   Dispo:  Patient From: Home  Planned Disposition: CIR  Expected discharge date: 06/07/2020  Medically stable for discharge: No        Consultants:   Orthopedic Surgery  Procedures:  Transtibial amputation Application of Prevena wound VAC by Dr. Sharol Given on 11/3.  Antimicrobials: Anti-infectives (From admission, onward)   Start     Dose/Rate Route Frequency Ordered Stop   06/03/20 0800  ceFAZolin (ANCEF) IVPB 2g/100 mL premix        2 g 200 mL/hr over 30 Minutes Intravenous On call to O.R. 06/03/20 0701 06/03/20 1644   06/03/20 0600  vancomycin (VANCOREADY) IVPB 1500 mg/300 mL  Status:  Discontinued        1,500 mg 150 mL/hr over 120 Minutes Intravenous Every 36 hours 06/01/20 1826 06/02/20 1130   06/03/20 0000  ceFEPIme (MAXIPIME) 2 g in sodium chloride 0.9 % 100 mL IVPB        2 g 200 mL/hr over 30 Minutes Intravenous Every 12 hours 06/02/20 1408 06/04/20 1330   06/02/20 1500  ceFEPIme (MAXIPIME) 2 g in sodium chloride 0.9 %  100 mL IVPB        2 g 200 mL/hr over 30 Minutes Intravenous  Once 06/02/20 1408 06/02/20 1653   06/02/20 1400  linezolid (ZYVOX) IVPB 600 mg        600 mg 300 mL/hr over 60 Minutes Intravenous Every 12 hours 06/02/20 1122 06/04/20 2359   06/01/20 1845  vancomycin (VANCOCIN) IVPB 1000 mg/200 mL premix  Status:  Discontinued          1,000 mg 200 mL/hr over 60 Minutes Intravenous  Once 06/01/20 1759 06/01/20 1815   06/01/20 1845  vancomycin (VANCOREADY) IVPB 2000 mg/400 mL        2,000 mg 200 mL/hr over 120 Minutes Intravenous  Once 06/01/20 1816 06/01/20 2042   06/01/20 1045  piperacillin-tazobactam (ZOSYN) IVPB 3.375 g        3.375 g 100 mL/hr over 30 Minutes Intravenous  Once 06/01/20 1043 06/01/20 1237        Objective: Vitals:   06/04/20 0330 06/04/20 0430 06/04/20 0510 06/04/20 0802  BP: (!) 142/50 (!) 130/55 (!) 105/54 115/67  Pulse: (!) 106 96 88 75  Resp: (!) 24 18 16 20   Temp:   97.6 F (36.4 C) 97.9 F (36.6 C)  TempSrc:   Oral Oral  SpO2: 100% 98% 96% 98%  Weight:      Height:        Intake/Output Summary (Last 24 hours) at 06/04/2020 1458 Last data filed at 06/04/2020 0600 Gross per 24 hour  Intake 2355.54 ml  Output 550 ml  Net 1805.54 ml   Filed Weights   06/01/20 1420 06/03/20 1452  Weight: 111.5 kg 111.5 kg    Examination:  Awake Alert, Oriented X 3, No new F.N deficits, Normal affect Symmetrical Chest wall movement, Good air movement bilaterally, CTAB  RRR,No Gallops,Rubs or new Murmurs, No Parasternal Heave +ve B.Sounds, Abd Soft, No tenderness, No rebound - guarding or rigidity. No Cyanosis, right lower extremity +1 edema, left status post BKA, on wound VAC.  Data Reviewed: I have personally reviewed following labs and imaging studies  CBC: Recent Labs  Lab 06/01/20 1147 06/02/20 0309 06/03/20 0321 06/04/20 0832  WBC 6.4 5.2 4.7 5.9  NEUTROABS 5.3  --  3.5  --   HGB 10.1* 9.1* 8.8* 9.8*  HCT 31.8* 28.7* 28.1* 31.1*  MCV 82.6 82.2 82.2 81.2  PLT 146* 154 150 245   Basic Metabolic Panel: Recent Labs  Lab 06/01/20 1147 06/02/20 0309 06/03/20 0321 06/04/20 0832 06/04/20 1349  NA 137 135 132* 130*  --   K 4.6 4.5 4.2 5.3*  --   CL 99 102 102 99  --   CO2 23 22 20* 19*  --   GLUCOSE 243* 290* 354* 444* 433*  BUN 40* 40* 41* 36*  --   CREATININE 2.07*  2.23* 2.22* 2.41*  --   CALCIUM 8.9 8.5* 8.3* 8.8*  --   MG  --   --  1.7  --   --    GFR: Estimated Creatinine Clearance: 40.4 mL/min (A) (by C-G formula based on SCr of 2.41 mg/dL (H)). Liver Function Tests: Recent Labs  Lab 06/01/20 1147 06/03/20 0321  AST 18 15  ALT 27 26  ALKPHOS 116 113  BILITOT 1.2 1.0  PROT 6.9 6.2*  ALBUMIN 3.3* 3.0*   No results for input(s): LIPASE, AMYLASE in the last 168 hours. No results for input(s): AMMONIA in the last 168 hours. Coagulation Profile: No results for input(s): INR,  PROTIME in the last 168 hours. Cardiac Enzymes: No results for input(s): CKTOTAL, CKMB, CKMBINDEX, TROPONINI in the last 168 hours. BNP (last 3 results) No results for input(s): PROBNP in the last 8760 hours. HbA1C: No results for input(s): HGBA1C in the last 72 hours. CBG: Recent Labs  Lab 06/03/20 1146 06/03/20 1448 06/03/20 1724 06/04/20 0741 06/04/20 1159  GLUCAP 283* 187* 176* 387* 493*   Lipid Profile: No results for input(s): CHOL, HDL, LDLCALC, TRIG, CHOLHDL, LDLDIRECT in the last 72 hours. Thyroid Function Tests: No results for input(s): TSH, T4TOTAL, FREET4, T3FREE, THYROIDAB in the last 72 hours. Anemia Panel: No results for input(s): VITAMINB12, FOLATE, FERRITIN, TIBC, IRON, RETICCTPCT in the last 72 hours. Sepsis Labs: Recent Labs  Lab 06/01/20 1147  LATICACIDVEN 1.1    Recent Results (from the past 240 hour(s))  Respiratory Panel by RT PCR (Flu A&B, Covid) - Nasopharyngeal Swab     Status: None   Collection Time: 06/01/20 11:02 AM   Specimen: Nasopharyngeal Swab  Result Value Ref Range Status   SARS Coronavirus 2 by RT PCR NEGATIVE NEGATIVE Final    Comment: (NOTE) SARS-CoV-2 target nucleic acids are NOT DETECTED.  The SARS-CoV-2 RNA is generally detectable in upper respiratoy specimens during the acute phase of infection. The lowest concentration of SARS-CoV-2 viral copies this assay can detect is 131 copies/mL. A negative result  does not preclude SARS-Cov-2 infection and should not be used as the sole basis for treatment or other patient management decisions. A negative result may occur with  improper specimen collection/handling, submission of specimen other than nasopharyngeal swab, presence of viral mutation(s) within the areas targeted by this assay, and inadequate number of viral copies (<131 copies/mL). A negative result must be combined with clinical observations, patient history, and epidemiological information. The expected result is Negative.  Fact Sheet for Patients:  PinkCheek.be  Fact Sheet for Healthcare Providers:  GravelBags.it  This test is no t yet approved or cleared by the [place] FDA and  has been authorized for detection and/or diagnosis of SARS-CoV-2 by FDA under an Emergency Use Authorization (EUA). This EUA will remain  in effect (meaning this test can be used) for the duration of the COVID-19 declaration under Section 564(b)(1) of the Act, 21 U.S.C. section 360bbb-3(b)(1), unless the authorization is terminated or revoked sooner.     Influenza A by PCR NEGATIVE NEGATIVE Final   Influenza B by PCR NEGATIVE NEGATIVE Final    Comment: (NOTE) The Xpert Xpress SARS-CoV-2/FLU/RSV assay is intended as an aid in  the diagnosis of influenza from Nasopharyngeal swab specimens and  should not be used as a sole basis for treatment. Nasal washings and  aspirates are unacceptable for Xpert Xpress SARS-CoV-2/FLU/RSV  testing.  Fact Sheet for Patients: PinkCheek.be  Fact Sheet for Healthcare Providers: GravelBags.it  This test is not yet approved or cleared by the [place] FDA and  has been authorized for detection and/or diagnosis of SARS-CoV-2 by  FDA under an Emergency Use Authorization (EUA). This EUA will remain  in effect (meaning this test can be used) for  the duration of the  Covid-19 declaration under Section 564(b)(1) of the Act, 21  U.S.C. section 360bbb-3(b)(1), unless the authorization is  terminated or revoked. Performed at [facility], [place] [address] [place], [place] [address]   Culture, blood (routine x 2)     Status: None (Preliminary result)   Collection Time: 06/01/20 11:47 AM   Specimen: BLOOD  Result Value Ref  Range Status   Specimen Description   Final    BLOOD RIGHT ANTECUBITAL Performed at [facility] [address], [place], [place] [address]    Special Requests   Final    BOTTLES DRAWN AEROBIC AND ANAEROBIC Blood Culture results may not be optimal due to an excessive volume of blood received in culture bottles Performed at [facility] [address] [place], [place] [address]    Culture   Final    NO GROWTH 3 DAYS Performed at [facility], [place] [address], [place], [place] [address]    Report Status PENDING  Incomplete  Culture, blood (routine x 2)     Status: None (Preliminary result)   Collection Time: 06/01/20 11:47 AM   Specimen: BLOOD  Result Value Ref Range Status   Specimen Description   Final    BLOOD LEFT ANTECUBITAL Performed at [facility] [address], [place], [place] [address]    Special Requests   Final    BOTTLES DRAWN AEROBIC AND ANAEROBIC Blood Culture results may not be optimal due to an excessive volume of blood received in culture bottles Performed at [facility] [address], [place], [place] [address]    Culture   Final    NO GROWTH 3 DAYS Performed at [facility], [place] [address] [place], [place] [address]    Report Status PENDING  Incomplete  MRSA PCR Screening     Status: None   Collection Time: 06/02/20  7:00 AM   Specimen: Nasopharyngeal  Result Value Ref Range Status   MRSA by PCR NEGATIVE NEGATIVE Final    Comment:        The GeneXpert MRSA Assay  (FDA approved for NASAL specimens only), is one component of a comprehensive MRSA colonization surveillance program. It is not intended to diagnose MRSA infection nor to guide or monitor treatment for MRSA infections. Performed at [facility], [place] [address], [place], [place] [address]      Radiology Studies: [facility] Chest Port 1 View  Result Date: 06/04/2020 CLINICAL DATA:  Acute respiratory distress EXAM: PORTABLE CHEST 1 VIEW COMPARISON:  12/21/2019 FINDINGS: Heart is normal size. Vascular congestion. Perihilar opacities and interstitial prominence. No effusions. No acute bony abnormality. IMPRESSION: Vascular congestion and perihilar opacities with interstitial prominence may reflect edema or infection. Electronically Signed   By: Rolm Baptise M.D.   On: 06/04/2020 03:17    Scheduled Meds: . (feeding supplement) PROSource Plus  30 mL Oral TID BM  . atorvastatin  10 mg Oral Daily  . docusate sodium  100 mg Oral BID  . feeding supplement  237 mL Oral Q24H  . fenofibrate  54 mg Oral Daily  . heparin  5,000 Units Subcutaneous Q8H  . insulin aspart  0-15 Units Subcutaneous TID WC  . insulin aspart  10 Units Subcutaneous Once  . insulin aspart  6 Units Subcutaneous TID WC  . insulin detemir  10 Units Subcutaneous QHS  . insulin detemir  15 Units Subcutaneous Daily  . mycophenolate  360 mg Oral BID  . pantoprazole  40 mg Oral Daily  . tacrolimus  1 mg Oral BID   Continuous Infusions: . sodium chloride 250 mL (06/01/20 1838)  . sodium chloride 50 mL/hr at 06/03/20 0005  . sodium chloride 75 mL/hr at 06/03/20 2312  . linezolid (ZYVOX) IV 600 mg (06/04/20 1039)     LOS: 3 days   Emeline Gins  Mckenzey Parcell, MD [facility] Pager On Amion  If 7PM-7AM, please contact night-coverage 06/04/2020, 2:58 PM

## 2020-06-04 NOTE — Progress Notes (Signed)
   06/04/20 0214  Assess: MEWS Score  BP (!) 144/124  Pulse Rate (!) 121  Resp (!) 28  Level of Consciousness Alert  SpO2 (!) 82 %  O2 Device Room Air  Assess: MEWS Score  MEWS Temp 0  MEWS Systolic 0  MEWS Pulse 2  MEWS RR 2  MEWS LOC 0  MEWS Score 4  MEWS Score Color Red  Assess: if the MEWS score is Yellow or Red  Were vital signs taken at a resting state? Yes  Focused Assessment Change from prior assessment (see assessment flowsheet)  Early Detection of Sepsis Score *See Row Information* Low  MEWS guidelines implemented *See Row Information* Yes  Treat  MEWS Interventions Escalated (See documentation below)  Pain Scale 0-10  Pain Score 0 (tightness in chest)  Multiple Pain Sites No  Complains of Anxiety;Coughing;Shortness of breath  Take Vital Signs  Increase Vital Sign Frequency  Red: Q 1hr X 4 then Q 4hr X 4, if remains red, continue Q 4hrs  Escalate  MEWS: Escalate Red: discuss with charge nurse/RN and provider, consider discussing with RRT  Notify: Charge Nurse/RN  Name of Charge Nurse/RN Notified Roni  Date Charge Nurse/RN Notified 06/04/20  Time Charge Nurse/RN Notified 0214  Notify: Provider  Provider Name/Title Dr Cyd Silence  Date Provider Notified 06/04/20  Time Provider Notified 0400  Notification Type Call  Notification Reason Change in status  Response See new orders  Date of Provider Response 06/04/20  Time of Provider Response 0400  Notify: Rapid Response  Name of Rapid Response RN Notified Ross  Date Rapid Response Notified 06/04/20  Time Rapid Response Notified 0215  Document  Patient Outcome Stabilized after interventions  Progress note created (see row info) Yes

## 2020-06-05 ENCOUNTER — Encounter (HOSPITAL_COMMUNITY): Payer: BC Managed Care – PPO

## 2020-06-05 ENCOUNTER — Encounter (HOSPITAL_COMMUNITY): Payer: Self-pay | Admitting: Internal Medicine

## 2020-06-05 DIAGNOSIS — I350 Nonrheumatic aortic (valve) stenosis: Secondary | ICD-10-CM

## 2020-06-05 LAB — CBC
HCT: 28.5 % — ABNORMAL LOW (ref 39.0–52.0)
Hemoglobin: 9.2 g/dL — ABNORMAL LOW (ref 13.0–17.0)
MCH: 26 pg (ref 26.0–34.0)
MCHC: 32.3 g/dL (ref 30.0–36.0)
MCV: 80.5 fL (ref 80.0–100.0)
Platelets: 162 10*3/uL (ref 150–400)
RBC: 3.54 MIL/uL — ABNORMAL LOW (ref 4.22–5.81)
RDW: 13.9 % (ref 11.5–15.5)
WBC: 5.3 10*3/uL (ref 4.0–10.5)
nRBC: 0 % (ref 0.0–0.2)

## 2020-06-05 LAB — GLUCOSE, CAPILLARY
Glucose-Capillary: 176 mg/dL — ABNORMAL HIGH (ref 70–99)
Glucose-Capillary: 210 mg/dL — ABNORMAL HIGH (ref 70–99)
Glucose-Capillary: 171 mg/dL — ABNORMAL HIGH (ref 70–99)
Glucose-Capillary: 143 mg/dL — ABNORMAL HIGH (ref 70–99)

## 2020-06-05 LAB — BASIC METABOLIC PANEL
Anion gap: 11 (ref 5–15)
BUN: 47 mg/dL — ABNORMAL HIGH (ref 8–23)
CO2: 19 mmol/L — ABNORMAL LOW (ref 22–32)
Calcium: 8.8 mg/dL — ABNORMAL LOW (ref 8.9–10.3)
Chloride: 102 mmol/L (ref 98–111)
Creatinine, Ser: 2.49 mg/dL — ABNORMAL HIGH (ref 0.61–1.24)
GFR, Estimated: 28 mL/min — ABNORMAL LOW (ref >60)
Glucose, Bld: 160 mg/dL — ABNORMAL HIGH (ref 70–99)
Potassium: 4.4 mmol/L (ref 3.5–5.1)
Sodium: 132 mmol/L — ABNORMAL LOW (ref 135–145)

## 2020-06-05 MED ORDER — FENTANYL CITRATE (PF) 100 MCG/2ML IJ SOLN
25.0000 ug | Freq: Once | INTRAMUSCULAR | Status: AC
  Administered 2020-06-05: 25 ug via INTRAVENOUS
  Filled 2020-06-05: qty 2

## 2020-06-05 MED ORDER — SODIUM CHLORIDE 0.9% FLUSH
3.0000 mL | Freq: Two times a day (BID) | INTRAVENOUS | Status: DC
  Administered 2020-06-05 – 2020-06-07 (×5): 3 mL via INTRAVENOUS

## 2020-06-05 MED ORDER — LIVING WELL WITH DIABETES BOOK
Freq: Once | Status: AC
  Filled 2020-06-05: qty 1

## 2020-06-05 NOTE — H&P (View-Only) (Signed)
[facility] VALVE TEAM  Cardiology Consultation:   Patient ID: Steven [name] MRN: [id number]; DOB: September 23, 1955  Admit date: 06/01/2020 Date of Consult: 06/05/2020  Primary Care Provider: Isaac Bliss, Rayford Halsted, MD [facility] Cardiologist: New to [facility]- Dr. Algernon Huxley [facility] Electrophysiologist:  None   Patient Profile:   Steven [name] is a 64 y.o. male with a hx of HTN, HLD, IDDM, morbid obesity, OSA, anemia of chronic disease, ESRD s/p renal transplantation (2006) with CKD stage IV, PAD, and severe AS who is being seen today for the evaluation of severe AS at the request of Dr. Mia Creek.  History of Present Illness:   Steven [name] lives in [place], [place]. He is separated but has 4 children that he is close to and live locally. He is a retired Dance movement psychotherapist. Chronic knee pain from competitive sports with heavy NSAID use and uncontrolled diabetes led to ESRD requiring renal transplantation at [facility] in 2006. He is followed by Dr [name] in nephrology who manages his chronic immunosuppressive medications. He has been told he had a heart murmur for at least 20 years but never formally evaluated by cardiology. 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 He drives and takes care of all of his own ADLs. He is able to walk without the aid of a walker or cane, but recently developed a left foot ulcer that has limited his mobility. Of note, the patient has poor dentition and needs full dental extraction. He was waiting to change insurance plans before getting this taken care of. Also, he is fully vaccinated for covid 19.   He presented to [facility] ED on 11/1 for evaluation of his non healing diabetic pressure ulcer on left foot. He failed outpatient antibiotics and developed low grade fevers. Inpatient work up revealed osteomyelitis and required tibial amputation on 11/3. The  patient developed volume overload that improved with IV lasix. BNP 1403, CXR with CHF, creat around baseline 2.1-2.4. Echo 11/4 showed EF 60-65%, moderate concentric LVH, G2DD, mild mitral stenosis, and severe AS with a mean gradient of 67 mm hg, AVA 4.52 cm2, SVI 0.92 (these are inaccurate due to mismeasured LVOT). Due to severe AS, structural heart is consulted for AVR options.   The patient is seen sitting up in bed. He mostly complains of left LE pain that is now finally controlled with dilaudid. He is now comfortable in terms of breathing after iv lasix. He admits to insidious, progressive dyspnea on exertion and fatigue over the past 6-12 months. He just does not have the energy he used to. He occasionally gets LE edema for which he has PRN lasix. He denies orthopnea or PND and sleeps with a CPAP. He does report mild exertional chest tightness with long walks that resolve with rest. He occasionally has some mild lightheadedness with exertion but no syncope. Plan is to discharge him to CIR when pain better controlled. Insurance approval process to start Monday.   Past Medical History:  Diagnosis Date  . Cataract    removed by surgery  . Diabetes ([place])    type 2  . Diabetic glomerulosclerosis [facility])    [facility] 03/21/12 by Dr. Corliss Parish.  . GERD (gastroesophageal reflux disease)   . HLD (hyperlipidemia)   . Hyperlipidemia   . Hyperparathyroidism ([place])   . Hypertension    Patient denies this DX, no meds  . Kidney transplant recipient   .  Obesity   . Sleep apnea    uses CPAP nightly  . Venous insufficiency   . Vitamin D deficiency     Past Surgical History:  Procedure Laterality Date  . AMPUTATION Right 07/19/2017   Procedure: AMPUTATION RIGHT 2ND TOE;  Surgeon: Leandrew Koyanagi, MD;  Location: [place];  Service: Orthopedics;  Laterality: Right;  . AMPUTATION Left 06/03/2020   Procedure: LEFT BELOW KNEE AMPUTATION;  Surgeon: Newt Minion, MD;  Location: [facility];   Service: Orthopedics;  Laterality: Left;  . COLONOSCOPY     greater than 10 yrs ago  . EYE SURGERY Bilateral    cataracts removed  . KIDNEY TRANSPLANT  2006  . NEPHRECTOMY TRANSPLANTED ORGAN  2006  . UPPER GASTROINTESTINAL ENDOSCOPY    . WISDOM TOOTH EXTRACTION       Home Medications:  Prior to Admission medications   Medication Sig Start Date End Date Taking? Authorizing Provider  acetaminophen (TYLENOL) 500 MG tablet Take 500 mg by mouth every 6 (six) hours as needed for mild pain or fever.   Yes [provider]  atorvastatin (LIPITOR) 10 MG tablet Take 10 mg by mouth daily.  07/07/17  Yes [provider]  doxycycline (VIBRAMYCIN) 100 MG capsule Take 1 capsule (100 mg total) by mouth 2 (two) times daily. 05/29/20  Yes Yu, Amy V, PA-C  fenofibrate (TRICOR) 48 MG tablet Take 48 mg by mouth daily.   Yes [provider]  furosemide (LASIX) 40 MG tablet Take 40 mg by mouth daily as needed for fluid (swelling).  07/06/17  Yes [provider]  glyBURIDE (DIABETA) 5 MG tablet Take 5 mg by mouth 2 (two) times daily with a meal.    Yes [provider]  insulin aspart (NOVOLOG) 100 UNIT/ML injection Inject 0-30 Units into the skin 3 (three) times daily as needed for high blood sugar. Sliding scale    Yes [provider]  insulin detemir (LEVEMIR) 100 UNIT/ML FlexPen Inject 10 Units into the skin daily. 01/30/20  Yes Nicolette Bang, DO  mycophenolate (MYFORTIC) 360 MG TBEC Take 360 mg by mouth 2 (two) times daily.   Yes [provider]  omeprazole (PRILOSEC) 20 MG capsule Take 1 capsule (20 mg total) by mouth daily. 01/30/20  Yes Nicolette Bang, DO  oxyCODONE (OXY IR/ROXICODONE) 5 MG immediate release tablet Take 1 tablet (5 mg total) by mouth every 4 (four) hours as needed for severe pain. 02/21/20  Yes Isaac Bliss, Rayford Halsted, MD  tacrolimus (PROGRAF) 1 MG capsule Take 1 mg by mouth 2 (two) times daily.    Yes [provider]  Vitamin D, Ergocalciferol, (DRISDOL) 1.25 MG (50000 UNIT) CAPS capsule TAKE 1 CAPSULE (50,000 UNITS TOTAL) BY MOUTH EVERY 7 (SEVEN) DAYS FOR 12 DOSES. Patient taking differently: Take 50,000 Units by mouth every 7 (seven) days. Wednesday 05/13/20 07/30/20 Yes Erline Hau, MD  cephALEXin (KEFLEX) 500 MG capsule Take 1 capsule (500 mg total) by mouth 4 (four) times daily. Patient not taking: Reported on 06/01/2020 05/24/20   Chevis Pretty, FNP  Insulin Pen Needle (BD PEN NEEDLE NANO 2ND GEN) 32G X 4 MM MISC Use daily for glucose control 03/11/20   Isaac Bliss, Rayford Halsted, MD  Insulin Pen Needle 32G X 4 MM MISC Use as directed with insulin pen 12/24/19   Geradine Girt, DO  ONETOUCH VERIO test strip 1 each by Other route 3 (three) times daily. 01/20/20   [provider]  Inpatient Medications: Scheduled Meds: . (feeding supplement) PROSource Plus  30 mL Oral TID BM  . atorvastatin  10 mg Oral Daily  . docusate sodium  100 mg Oral BID  . fenofibrate  54 mg Oral Daily  . glyBURIDE  5 mg Oral BID WC  . heparin  5,000 Units Subcutaneous Q8H  . insulin aspart  0-15 Units Subcutaneous TID WC  . insulin aspart  6 Units Subcutaneous TID WC  . insulin detemir  10 Units Subcutaneous QHS  . insulin detemir  15 Units Subcutaneous Daily  . mycophenolate  360 mg Oral BID  . pantoprazole  40 mg Oral Daily  . tacrolimus  1 mg Oral BID   Continuous Infusions: . sodium chloride 250 mL (06/01/20 1838)   PRN Meds: sodium chloride, acetaminophen **OR** acetaminophen, furosemide, HYDROmorphone (DILAUDID) injection, metoCLOPramide **OR** metoCLOPramide (REGLAN) injection, ondansetron **OR** ondansetron (ZOFRAN) IV, oxyCODONE, polyethylene glycol  Allergies:    Allergies  Allergen Reactions  . Sulfa Antibiotics Rash    Social History:   Social History   Socioeconomic History  . Marital status: Legally Separated    Spouse name: Not on file  . Number  of children: Not on file  . Years of education: Not on file  . Highest education level: Not on file  Occupational History  . Occupation: trooper  Tobacco Use  . Smoking status: Never Smoker  . Smokeless tobacco: Former Systems developer    Types: Secondary school teacher  . Vaping Use: Never used  Substance and Sexual Activity  . Alcohol use: No  . Drug use: No  . Sexual activity: Yes  Other Topics Concern  . Not on file  Social History Narrative  . Not on file   Social Determinants of Health   Financial Resource Strain:   . Difficulty of Paying Living Expenses: Not on file  Food Insecurity:   . Worried About Charity fundraiser in the Last Year: Not on file  . Ran Out of Food in the Last Year: Not on file  Transportation Needs:   . Lack of Transportation (Medical): Not on file  . Lack of Transportation (Non-Medical): Not on file  Physical Activity:   . Days of Exercise per Week: Not on file  . Minutes of Exercise per Session: Not on file  Stress:   . Feeling of Stress : Not on file  Social Connections:   . Frequency of Communication with Friends and Family: Not on file  . Frequency of Social Gatherings with Friends and Family: Not on file  . Attends Religious Services: Not on file  . Active Member of Clubs or Organizations: Not on file  . Attends Archivist Meetings: Not on file  . Marital Status: Not on file  Intimate Partner Violence:   . Fear of Current or Ex-Partner: Not on file  . Emotionally Abused: Not on file  . Physically Abused: Not on file  . Sexually Abused: Not on file    Family History:   Family History  Problem Relation Age of Onset  . Healthy Mother   . Healthy Father   . Colon cancer Neg Hx   . Rectal cancer Neg Hx   . Stomach cancer Neg Hx      ROS:  Please see the history of present illness.  All other ROS reviewed and negative.     Physical Exam/Data:   Vitals:   06/04/20 1707 06/04/20 1900 06/05/20 0305 06/05/20 0926  BP: (!) 111/52 115/63  Marland Kitchen)  125/55 121/65  Pulse: 71  76 88  Resp: 20 20 18 20   Temp: 98.1 F (36.7 C) 98.2 F (36.8 C) 97.8 F (36.6 C) 98.5 F (36.9 C)  TempSrc: Oral Oral Oral Oral  SpO2: 100% 96% 98% 97%  Weight:      Height:        Intake/Output Summary (Last 24 hours) at 06/05/2020 1401 Last data filed at 06/05/2020 0500 Gross per 24 hour  Intake 780 ml  Output 400 ml  Net 380 ml   Last 3 Weights 06/03/2020 06/01/2020 03/27/2020  Weight (lbs) 245 lb 13 oz 245 lb 13 oz 245 lb 12.8 oz  Weight (kg) 111.5 kg 111.5 kg 111.494 kg     Body mass index is 34.3 kg/m.  General:  Well nourished, well developed, in no acute distress HEENT: normal Lymph: no adenopathy  Cardiac:  normal S1, S2; RRR; 3/6 SEM heard best at RUSB.  Lungs:  clear to auscultation bilaterally, no wheezing, rhonchi or rales Abd: soft, nontender, no hepatomegaly  Ext: s/p Left BKA, trace right pretibial edema with chronic venous stasis changes Musculoskeletal:  No deformities, BUE and BLE strength normal and equal Skin: warm and dry  Neuro:  CNs 2-12 intact, no focal abnormalities noted Psych:  Normal affect   EKG:  The EKG was personally reviewed and demonstrates:  Sinus tachy HR 106, LVH with repol abnormality, ILBBB   Relevant CV Studies: IMPRESSIONS  1. Severely calcified aortic and mitral valves with severe aortic  stenosis and mild mitral stenosis.  2. Left ventricular ejection fraction, by estimation, is 60 to 65%. The  left ventricle has normal function. The left ventricle has no regional  wall motion abnormalities. There is moderate concentric left ventricular  hypertrophy. Left ventricular  diastolic parameters are consistent with Grade II diastolic dysfunction  (pseudonormalization). Elevated left atrial pressure.  3. Right ventricular systolic function is normal. The right ventricular  size is normal.  4. The mitral valve is normal in structure. Mild mitral valve  regurgitation. Mild mitral stenosis. Moderate  mitral annular  calcification.  5. The aortic valve is calcified. There is severe calcifcation of the  aortic valve. There is severe thickening of the aortic valve. Aortic valve  regurgitation is not visualized. Severe aortic valve stenosis. Aortic  valve mean gradient measures 67.0  mmHg.  6. The inferior vena cava is normal in size with greater than 50%  respiratory variability, suggesting right atrial pressure of 3 mmHg.   FINDINGS  Left Ventricle: Left ventricular ejection fraction, by estimation, is 60  to 65%. The left ventricle has normal function. The left ventricle has no  regional wall motion abnormalities. Definity contrast agent was given IV  to delineate the left ventricular  endocardial borders. The left ventricular internal cavity size was normal  in size. There is moderate concentric left ventricular hypertrophy. Left  ventricular diastolic parameters are consistent with Grade II diastolic  dysfunction (pseudonormalization).  Elevated left atrial pressure.   Right Ventricle: The right ventricular size is normal. No increase in  right ventricular wall thickness. Right ventricular systolic function is  normal.   Left Atrium: Left atrial size was normal in size.   Right Atrium: Right atrial size was normal in size.   Pericardium: There is no evidence of pericardial effusion.   Mitral Valve: The mitral valve is normal in structure. There is severe  thickening of the mitral valve leaflet(s). There is severe calcification  of the mitral valve leaflet(s). Moderate mitral  annular calcification.  Mild mitral valve regurgitation. Mild  mitral valve stenosis. MV peak gradient, 13.7 mmHg. The mean mitral valve  gradient is 4.0 mmHg.   Tricuspid Valve: The tricuspid valve is normal in structure. Tricuspid  valve regurgitation is not demonstrated. No evidence of tricuspid  stenosis.   Aortic Valve: The aortic valve is calcified. There is severe calcifcation  of the  aortic valve. There is severe thickening of the aortic valve.  Aortic valve regurgitation is not visualized. Severe aortic stenosis is  present. Aortic valve mean gradient  measures 67.0 mmHg. Aortic valve peak gradient measures 108.4 mmHg. Aortic  valve area, by VTI measures 4.16 cm.   Pulmonic Valve: The pulmonic valve was normal in structure. Pulmonic valve  regurgitation is not visualized. No evidence of pulmonic stenosis.   Aorta: The aortic root is normal in size and structure.   Venous: The inferior vena cava is normal in size with greater than 50%  respiratory variability, suggesting right atrial pressure of 3 mmHg.   IAS/Shunts: No atrial level shunt detected by color flow Doppler.     LEFT VENTRICLE  PLAX 2D  LVIDd:     4.90 cm   Diastology  LVIDs:     3.70 cm   LV e' medial:  4.43 cm/s  LV PW:     1.60 cm   LV E/e' medial: 35.7  LV IVS:    1.20 cm   LV e' lateral:  5.37 cm/s  LVOT diam:   2.40 cm   LV E/e' lateral: 29.4  LV SV:     525  LV SV Index:  228  LVOT Area:   4.52 cm    LV Volumes (MOD)  LV vol d, MOD A2C: 79.6 ml  LV vol d, MOD A4C: 99.0 ml  LV vol s, MOD A2C: 18.7 ml  LV vol s, MOD A4C: 42.3 ml  LV SV MOD A2C:   60.9 ml  LV SV MOD A4C:   99.0 ml  LV SV MOD BP:   61.7 ml   RIGHT VENTRICLE      IVC  RV S prime:   9.65 cm/s IVC diam: 2.50 cm  TAPSE (M-mode): 2.4 cm   LEFT ATRIUM       Index    RIGHT ATRIUM      Index  LA diam:    3.90 cm 1.69 cm/m RA Area:   15.90 cm  LA Vol (A2C):  57.4 ml 24.94 ml/m RA Volume:  39.40 ml 17.12 ml/m  LA Vol (A4C):  27.0 ml 11.73 ml/m  LA Biplane Vol: 40.0 ml 17.38 ml/m  AORTIC VALVE  AV Area (Vmax):  4.52 cm  AV Area (Vmean):  4.45 cm  AV Area (VTI):   4.16 cm  AV Vmax:      520.60 cm/s  AV Vmean:     371.000 cm/s  AV VTI:      1.262 m  AV Peak Grad:   108.4 mmHg  AV Mean Grad:   67.0 mmHg    LVOT Vmax:     520.00 cm/s  LVOT Vmean:    365.000 cm/s  LVOT VTI:     1.160 m  LVOT/AV VTI ratio: 0.92    AORTA  Ao Root diam: 3.60 cm  Ao Asc diam: 3.30 cm   MITRAL VALVE  MV Area (PHT): 2.37 cm   SHUNTS  MV Peak grad: 13.7 mmHg  Systemic VTI: 1.16 m  MV Mean grad: 4.0 mmHg  Systemic Diam: 2.40 cm  MV Vmax:    1.85 m/s  MV Vmean:   85.8 cm/s  MV Decel Time: 320 msec  MV E velocity: 158.00 cm/s  MV A velocity: 94.90 cm/s  MV E/A ratio: 1.66   Ena Dawley MD  Electronically signed by Ena Dawley MD  Signature Date/Time: 06/04/2020/4:31:23 PM    Laboratory Data:  High Sensitivity Troponin:  No results for input(s): TROPONINIHS in the last 720 hours.   Chemistry Recent Labs  Lab 06/03/20 0321 06/03/20 0321 06/04/20 0832 06/04/20 1349 06/05/20 0253  NA 132*  --  130*  --  132*  K 4.2  --  5.3*  --  4.4  CL 102  --  99  --  102  CO2 20*  --  19*  --  19*  GLUCOSE 354*   < > 444* 433* 160*  BUN 41*  --  36*  --  47*  CREATININE 2.22*  --  2.41*  --  2.49*  CALCIUM 8.3*  --  8.8*  --  8.8*  GFRNONAA 33*  --  30*  --  28*  ANIONGAP 10  --  12  --  11   < > = values in this interval not displayed.    Recent Labs  Lab 06/01/20 1147 06/03/20 0321  PROT 6.9 6.2*  ALBUMIN 3.3* 3.0*  AST 18 15  ALT 27 26  ALKPHOS 116 113  BILITOT 1.2 1.0   Hematology Recent Labs  Lab 06/03/20 0321 06/04/20 0832 06/05/20 0253  WBC 4.7 5.9 5.3  RBC 3.42* 3.83* 3.54*  HGB 8.8* 9.8* 9.2*  HCT 28.1* 31.1* 28.5*  MCV 82.2 81.2 80.5  MCH 25.7* 25.6* 26.0  MCHC 31.3 31.5 32.3  RDW 13.9 13.8 13.9  PLT 150 166 162   BNP Recent Labs  Lab 06/04/20 0832  BNP 1,403.3*    DDimer No results for input(s): DDIMER in the last 168 hours.   Radiology/Studies:  MR FOOT LEFT W WO CONTRAST  Result Date: 06/02/2020 CLINICAL DATA:  Pain, swelling and diabetic ulcer EXAM: MRI OF THE LEFT FOREFOOT WITHOUT AND WITH CONTRAST TECHNIQUE: Multiplanar,  multisequence MR imaging of the left foot was performed both before and after administration of intravenous contrast. CONTRAST:  43mL GADAVIST GADOBUTROL 1 MMOL/ML IV SOLN COMPARISON:  Radiographs 06/01/2020 FINDINGS: There is an open wound along the plantar and lateral aspect of the foot at the level of the base of the fifth metatarsal. Significant focal/localized cellulitis and extensive rim enhancing abscess. This measures approximately 5.0 x 4.8 cm. Underlying osteomyelitis involving the base of the fifth metatarsal. The peroneus brevis tendon is intact. No pathologic fracture. No other sites of osteomyelitis are identified. No findings for septic arthritis involving the fifth metatarsal cuboid joint. Extensive myositis involving the short flexor muscles of the foot without findings for pyomyositis. There is moderate plantar fasciitis. The tibiotalar and subtalar joints are maintained. Sinus tarsi is unremarkable. IMPRESSION: 1. Open wound along the plantar and lateral aspect of the foot at the level of the base of the fifth metatarsal. Associated 5.0 x 4.8 cm subcutaneous abscess. 2. Underlying osteomyelitis involving the base of the fifth metatarsal. 3. Extensive myositis involving the short flexor muscles of the foot without findings for pyomyositis. 4. Moderate plantar fasciitis. Electronically Signed   By: Marijo Sanes M.D.   On: 06/02/2020 09:47   DG Chest Port 1 View  Result Date: 06/04/2020 CLINICAL DATA:  Acute respiratory distress EXAM: PORTABLE CHEST 1  VIEW COMPARISON:  12/21/2019 FINDINGS: Heart is normal size. Vascular congestion. Perihilar opacities and interstitial prominence. No effusions. No acute bony abnormality. IMPRESSION: Vascular congestion and perihilar opacities with interstitial prominence may reflect edema or infection. Electronically Signed   By: Rolm Baptise M.D.   On: 06/04/2020 03:17   ECHOCARDIOGRAM COMPLETE  Result Date: 06/04/2020    ECHOCARDIOGRAM REPORT   Patient  Name:   Steven [name] [facility] Date of Exam: 06/04/2020 Medical Rec #:  [id number]           Height:       71.0 in Accession #:    [id number]          Weight:       245.8 lb Date of Birth:  1955-09-01           BSA:          2.302 m Patient Age:    12 years            BP:           115/67 mmHg Patient Gender: M                   HR:           67 bpm. Exam Location:  Inpatient Procedure: 2D Echo, 3D Echo, Cardiac Doppler, Color Doppler and Intracardiac            Opacification Agent REPORT CONTAINS CRITICAL RESULT Indications:    R01.1 Murmur  History:        Patient has no prior history of Echocardiogram examinations.                 Signs/Symptoms:Shortness of Breath and Dyspnea; Risk                 Factors:Sleep Apnea, Hypertension and Diabetes. Leg amputation                 on previous day. Kidney transplant.  Sonographer:    Roseanna Rainbow RDCS Referring Phys: [id number] DAWOOD S [facility]  Sonographer Comments: Technically difficult study due to poor echo windows and patient is morbidly obese. Image acquisition challenging due to patient body habitus. IMPRESSIONS  1. Severely calcified aortic and mitral valves with severe aortic stenosis and mild mitral stenosis.  2. Left ventricular ejection fraction, by estimation, is 60 to 65%. The left ventricle has normal function. The left ventricle has no regional wall motion abnormalities. There is moderate concentric left ventricular hypertrophy. Left ventricular diastolic parameters are consistent with Grade II diastolic dysfunction (pseudonormalization). Elevated left atrial pressure.  3. Right ventricular systolic function is normal. The right ventricular size is normal.  4. The mitral valve is normal in structure. Mild mitral valve regurgitation. Mild mitral stenosis. Moderate mitral annular calcification.  5. The aortic valve is calcified. There is severe calcifcation of the aortic valve. There is severe thickening of the aortic valve. Aortic valve regurgitation is not  visualized. Severe aortic valve stenosis. Aortic valve mean gradient measures 67.0 mmHg.  6. The inferior vena cava is normal in size with greater than 50% respiratory variability, suggesting right atrial pressure of 3 mmHg. FINDINGS  Left Ventricle: Left ventricular ejection fraction, by estimation, is 60 to 65%. The left ventricle has normal function. The left ventricle has no regional wall motion abnormalities. Definity contrast agent was given IV to delineate the left ventricular  endocardial borders. The left ventricular internal cavity size was normal in size. There is moderate concentric left ventricular  hypertrophy. Left ventricular diastolic parameters are consistent with Grade II diastolic dysfunction (pseudonormalization).  Elevated left atrial pressure. Right Ventricle: The right ventricular size is normal. No increase in right ventricular wall thickness. Right ventricular systolic function is normal. Left Atrium: Left atrial size was normal in size. Right Atrium: Right atrial size was normal in size. Pericardium: There is no evidence of pericardial effusion. Mitral Valve: The mitral valve is normal in structure. There is severe thickening of the mitral valve leaflet(s). There is severe calcification of the mitral valve leaflet(s). Moderate mitral annular calcification. Mild mitral valve regurgitation. Mild mitral valve stenosis. MV peak gradient, 13.7 mmHg. The mean mitral valve gradient is 4.0 mmHg. Tricuspid Valve: The tricuspid valve is normal in structure. Tricuspid valve regurgitation is not demonstrated. No evidence of tricuspid stenosis. Aortic Valve: The aortic valve is calcified. There is severe calcifcation of the aortic valve. There is severe thickening of the aortic valve. Aortic valve regurgitation is not visualized. Severe aortic stenosis is present. Aortic valve mean gradient measures 67.0 mmHg. Aortic valve peak gradient measures 108.4 mmHg. Aortic valve area, by VTI measures 4.16 cm.  Pulmonic Valve: The pulmonic valve was normal in structure. Pulmonic valve regurgitation is not visualized. No evidence of pulmonic stenosis. Aorta: The aortic root is normal in size and structure. Venous: The inferior vena cava is normal in size with greater than 50% respiratory variability, suggesting right atrial pressure of 3 mmHg. IAS/Shunts: No atrial level shunt detected by color flow Doppler.  LEFT VENTRICLE PLAX 2D LVIDd:         4.90 cm     Diastology LVIDs:         3.70 cm     LV e' medial:    4.43 cm/s LV PW:         1.60 cm     LV E/e' medial:  35.7 LV IVS:        1.20 cm     LV e' lateral:   5.37 cm/s LVOT diam:     2.40 cm     LV E/e' lateral: 29.4 LV SV:         525 LV SV Index:   228 LVOT Area:     4.52 cm  LV Volumes (MOD) LV vol d, MOD A2C: 79.6 ml LV vol d, MOD A4C: 99.0 ml LV vol s, MOD A2C: 18.7 ml LV vol s, MOD A4C: 42.3 ml LV SV MOD A2C:     60.9 ml LV SV MOD A4C:     99.0 ml LV SV MOD BP:      61.7 ml RIGHT VENTRICLE            IVC RV S prime:     9.65 cm/s  IVC diam: 2.50 cm TAPSE (M-mode): 2.4 cm LEFT ATRIUM             Index       RIGHT ATRIUM           Index LA diam:        3.90 cm 1.69 cm/m  RA Area:     15.90 cm LA Vol (A2C):   57.4 ml 24.94 ml/m RA Volume:   39.40 ml  17.12 ml/m LA Vol (A4C):   27.0 ml 11.73 ml/m LA Biplane Vol: 40.0 ml 17.38 ml/m  AORTIC VALVE AV Area (Vmax):    4.52 cm AV Area (Vmean):   4.45 cm AV Area (VTI):     4.16 cm AV Vmax:  520.60 cm/s AV Vmean:          371.000 cm/s AV VTI:            1.262 m AV Peak Grad:      108.4 mmHg AV Mean Grad:      67.0 mmHg LVOT Vmax:         520.00 cm/s LVOT Vmean:        365.000 cm/s LVOT VTI:          1.160 m LVOT/AV VTI ratio: 0.92  AORTA Ao Root diam: 3.60 cm Ao Asc diam:  3.30 cm MITRAL VALVE MV Area (PHT): 2.37 cm     SHUNTS MV Peak grad:  13.7 mmHg    Systemic VTI:  1.16 m MV Mean grad:  4.0 mmHg     Systemic Diam: 2.40 cm MV Vmax:       1.85 m/s MV Vmean:      85.8 cm/s MV Decel Time: 320 msec MV E  velocity: 158.00 cm/s MV A velocity: 94.90 cm/s MV E/A ratio:  1.66 Ena Dawley MD Electronically signed by Ena Dawley MD Signature Date/Time: 06/04/2020/4:31:23 PM    Final      STS Risk Calculator: Procedure: Isolated AVR Risk of Mortality: 2.319% Renal Failure: 13.059% Permanent Stroke: 1.244% Prolonged Ventilation: 8.441% DSW Infection: 0.532% Reoperation: 4.473% Morbidity or Mortality: 23.012% Short Length of Stay: 34.907% Long Length of Stay: 9.339%  _____________________   [facility] Cardiomyopathy Questionnaire  KCCQ-12 06/05/2020  1 a. Ability to shower/bathe Not at all limited  1 b. Ability to walk 1 block Slightly limited  1 c. Ability to hurry/jog Extremely limited  2. Edema feet/ankles/legs 1-2 times a week  3. Limited by fatigue At least once a day  4. Limited by dyspnea At least once a day  5. Sitting up / on 3+ pillows Never over the past 2 weeks  6. Limited enjoyment of life Limited quite a bit  7. Rest of life w/ symptoms Mostly dissatisfied  8 a. Participation in hobbies Moderately limited  8 b. Participation in chores Slightly limited  8 c. Visiting family/friends N/A, did not do for other reasons      Assessment and Plan:   Keylen Eckenrode is a 64 y.o. male with symptoms of severe, stage D aortic stenosis with NYHA Class III symptoms, currently admitted for left leg osteomyelitis c/b acute CHF. I have reviewed the patient's recent echocardiogram which is notable for vigorous LV systolic function and severe aortic stenosis with peak gradient of 108.4 mm hg and mean transvalvular gradient of 67 mmhg.   I have reviewed the natural history of aortic stenosis with the patient. We have discussed the limitations of medical therapy and the poor prognosis associated with symptomatic aortic stenosis. We have reviewed potential treatment options, including palliative medical therapy, conventional surgical aortic valve replacement, and  transcatheter aortic valve replacement. We discussed treatment options in the context of this patient's specific comorbid medical conditions.    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 Other significant comorbid conditions include osteomyelitis requiring amputation. TAVR seems like a reasonable treatment option for this patient pending formal cardiac surgical consultation. We discussed typical evaluation which will require a L/RHC, gated cardiac CTA and a CTA of the chest/abdomen/pelvis to evaluate both his cardiac anatomy and peripheral vasculature. Follow-up testing will be arranged  as an inpatient and outpatient. Since he will be here until at least Monday, we will plan to get diagnostic L/RHC done before discharged to CIR. Will gently hydrate him Sunday night given CKD stage IV for cath on Monday morning.   I have placed orders for cath on Monday AM at 10:30am with Dr. Saunders Revel. After L/RHC, he can be discharged to CIR. We will let him recover from recent leg amputation, arrange dental extractions and do the rest of the TAVR work up as an outpatient.    For questions or updates, please contact [place] Please consult [email or web address] for contact info under    Signed, Angelena Form, PA-C  06/05/2020 2:01 PM  Patient seen, examined. Available data reviewed. Agree with findings, assessment, and plan as outlined by Nell Range, PA-C.  The patient is independently interviewed and examined.  He is alert, oriented, in no distress.  HEENT is normal except for poor dentition, JVP is normal, carotid upstrokes are normal with bilateral bruits, lung fields are clear to auscultation bilaterally, heart is regular rate and rhythm with a grade 3/6 harsh late peaking systolic murmur at the right upper sternal border, diminished A2.   There is no diastolic murmur.  Abdomen is soft and nontender.  Left BKA site noted, trace right pretibial edema noted.  Upper extremity strength is normal.  Echo images were personally reviewed and demonstrate normal LV systolic function with no regional wall motion abnormalities.  There is moderate LVH present.  The aortic valve is severely calcified and restricted with very poor leaflet mobility.  The transvalvular gradients are severely elevated with a mean pressure gradient of 67 mmHg.  The patient's exam, progressive symptoms of exertional dyspnea and progressive fatigue, and echo findings are diagnostic of critical aortic stenosis.  I have reviewed the natural history of severe aortic stenosis with the patient and his mother who is at the bedside.  We discussed treatment options in the context of the patient's comorbid medical conditions.  His most important comorbidities include renal transplantation on chronic immunosuppressive therapy, stage IV chronic kidney disease, and diabetes complicated by recent diabetic foot ulcer and osteomyelitis requiring amputation.  The patient clearly requires aortic valve replacement and the question is whether TAVR or surgical AVR will be his preferred treatment option.  I think his risk of surgical aortic valve replacement is higher than that predicted in the STS risk calculator.  However, the patient understands he needs to undergo further studies to evaluate whether he would be a candidate for TAVR.  I have recommended a right and left heart catheterization to evaluate coronary anatomy, the presence of coronary obstruction, and evaluate intracardiac hemodynamics. I have reviewed the risks, indications, and alternatives to cardiac catheterization, possible angioplasty, and stenting with the patient. Risks include but are not limited to bleeding, infection, vascular injury, stroke, myocardial infection, arrhythmia, kidney injury, radiation-related injury in the case of  prolonged fluoroscopy use, emergency cardiac surgery, and death. The patient understands the risks of serious complication is 1-2 in 5625 with diagnostic cardiac cath and 1-2% or less with angioplasty/stenting.  Once his cardiac catheterization is completed, the patient can continue to undergo rehabilitation for his left below-knee amputation.  He would be eligible to transfer to [facility] inpatient rehab if he is accepted there.  We will arrange further studies with staged CT angiogram studies of the heart as well as the chest, abdomen, and pelvis, pending stability of the patient's renal function.  Ultimately, the patient will undergo formal cardiac  surgical evaluation once all of his studies are completed, as part of a multidisciplinary heart team review of his case.  Sherren Mocha, M.D. 06/05/2020 6:34 PM

## 2020-06-05 NOTE — PMR Pre-admission (Signed)
PMR Admission Coordinator Pre-Admission Assessment  Patient: Steven [name] is an 64 y.o., male MRN: [id number] DOB: 07-11-1956 Height: 5' 10.98" (180.3 cm) Weight: 109.4 kg  Insurance Information HMO:     PPO: yes     PCP:      IPA:      80/20:      OTHER:  PRIMARY: [employer] [place]      Policy#: [id number]     Subscriber: patient CM Name: Malachy Mood      Phone#: [phone]     Fax#: [phone] Pre-Cert#: [id number]      Employer:  [employer] provided by Malachy Mood for admit to CIR 06/10/20. Pt is approved for 14 days. Updates due to [facility] (see above).  Benefits:  Phone #: [phone]     Name:  Eff. Date: 08/02/2019-still active     Deduct: $1,250 ($1250 met)      Out of Pocket Max: $4,890 ($4,890 met)      Life Max: NA CIR: $300/admission co-pay, then, 80% coverage, 20% co-insurance      SNF: 80% coverage, 20% co-insurance; with a limit of 100 days/cal yr  Outpatient: $26 or $52 co-pay per visit pending provider type; limited by medical necessity    Home Health: 80% coverage, 20% co-insurance; limited by medical necessity       DME: 80% coverage, 20% co-insurance Providers:  SECONDARY: None      Policy#:      Phone#:   Development worker, community:       Phone#:   The Engineer, [employer]" for patients in Inpatient Rehabilitation Facilities with attached "Privacy Act [place] Records" was provided and verbally reviewed with: Patient  Emergency Contact Information Contact Information    Name Relation Home Work Mobile   Koebel,Wendie Spouse   [phone]      Current Medical History  Patient Admitting Diagnosis: L BKA   History of Present Illness: Pt is a 64 yo male with PMH of obesity, OSA, HTN, hyperlipidemia, DM type 2, ESRD s/p renal transplant in 2006. Pt admitted to [facility] on 06/01/20 due to a worsening diabetic pressure ulcer on his left foot. Work up with MRI revealed osteomyelitis. Pt was placed initally on broad-spectrum antibiotics and underwent tibial  amputation by Dr. Sharol Given on 11/3. Pt with nonoliguric AKI on CKD likely in setting of dehydration; Creatine monitored. Pt with acute diastolic CHF/severe aortic stenosis with mild lower extremity edema. Cardiology was consulted due to new finding for severe aorticstenosis and heart cath completed; TAVR being planned as outpatient. Pt recommended for CIR. Pt is to admit to CIR on 06/10/20.    [name] medical record from [facility] has been reviewed by the rehabilitation admission coordinator and physician.  Past Medical History  Past Medical History:  Diagnosis Date  . Cataract    removed by surgery  . Diabetes ([place])    type 2  . 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 Corliss Parish.  . GERD (gastroesophageal reflux disease)   . HLD (hyperlipidemia)   . Hyperparathyroidism ([place])   . Hypertension    Patient denies this DX, no meds  . Kidney transplant recipient   . Obesity   . Sleep apnea    uses CPAP nightly  . Venous insufficiency   . Vitamin D deficiency     Family History   family history includes Healthy in his father and mother.  Prior Rehab/Hospitalizations Has the patient had prior rehab or hospitalizations prior to  admission? Yes  Has the patient had major surgery during 100 days prior to admission? Yes   Current Medications  Current Facility-Administered Medications:  .  (feeding supplement) PROSource Plus liquid 30 mL, 30 mL, Oral, TID BM, End, Christopher, MD, 30 mL at 06/10/20 1212 .  0.9 %  sodium chloride infusion, , Intravenous, PRN, End, Christopher, MD, Last Rate: 10 mL/hr at 06/01/20 1838, 250 mL at 06/01/20 1838 .  0.9 %  sodium chloride infusion, 250 mL, Intravenous, PRN, End, Harrell Gave, MD .  acetaminophen (TYLENOL) tablet 650 mg, 650 mg, Oral, Q6H PRN, 650 mg at 06/03/20 2257 **OR** acetaminophen (TYLENOL) suppository 650 mg, 650 mg, Rectal, Q6H PRN, End, Christopher, MD .  atorvastatin  (LIPITOR) tablet 10 mg, 10 mg, Oral, Daily, End, Christopher, MD, 10 mg at 06/10/20 0826 .  docusate sodium (COLACE) capsule 100 mg, 100 mg, Oral, BID, End, Christopher, MD, 100 mg at 06/10/20 0826 .  fenofibrate tablet 54 mg, 54 mg, Oral, Daily, End, Christopher, MD, 54 mg at 06/10/20 0826 .  furosemide (LASIX) tablet 20 mg, 20 mg, Oral, Daily, [place]y, Silver Huguenin, MD, 20 mg at 06/10/20 0826 .  glyBURIDE (DIABETA) tablet 5 mg, 5 mg, Oral, BID WC, End, Christopher, MD, 5 mg at 06/10/20 0828 .  heparin injection 5,000 Units, 5,000 Units, Subcutaneous, Q8H, End, Christopher, MD, 5,000 Units at 06/10/20 1223 .  HYDROmorphone (DILAUDID) injection 0.5 mg, 0.5 mg, Intravenous, Q4H PRN, End, Christopher, MD, 0.5 mg at 06/08/20 1419 .  insulin aspart (novoLOG) injection 0-15 Units, 0-15 Units, Subcutaneous, TID WC, End, Christopher, MD, 3 Units at 06/10/20 1212 .  insulin aspart (novoLOG) injection 6 Units, 6 Units, Subcutaneous, TID WC, End, Christopher, MD, 6 Units at 06/10/20 1212 .  insulin detemir (LEVEMIR) injection 10 Units, 10 Units, Subcutaneous, QHS, End, Christopher, MD, 10 Units at 06/09/20 2155 .  insulin detemir (LEVEMIR) injection 15 Units, 15 Units, Subcutaneous, Daily, End, Christopher, MD, 15 Units at 06/10/20 [phone] .  metoCLOPramide (REGLAN) tablet 5-10 mg, 5-10 mg, Oral, Q8H PRN **OR** metoCLOPramide (REGLAN) injection 5-10 mg, 5-10 mg, Intravenous, Q8H PRN, End, Christopher, MD .  mycophenolate (MYFORTIC) EC tablet 360 mg, 360 mg, Oral, BID, End, Christopher, MD, 360 mg at 06/10/20 0826 .  ondansetron (ZOFRAN) tablet 4 mg, 4 mg, Oral, Q6H PRN **OR** ondansetron (ZOFRAN) injection 4 mg, 4 mg, Intravenous, Q6H PRN, End, Christopher, MD .  oxyCODONE (Oxy IR/ROXICODONE) immediate release tablet 5 mg, 5 mg, Oral, Q4H PRN, End, Christopher, MD, 5 mg at 06/10/20 5732 .  pantoprazole (PROTONIX) EC tablet 40 mg, 40 mg, Oral, Daily, End, Christopher, MD, 40 mg at 06/10/20 0827 .  polyethylene  glycol (MIRALAX / GLYCOLAX) packet 17 g, 17 g, Oral, Daily PRN, End, Christopher, MD .  sodium chloride flush (NS) 0.9 % injection 3 mL, 3 mL, Intravenous, Q12H, End, Christopher, MD, 3 mL at 06/10/20 0827 .  sodium chloride flush (NS) 0.9 % injection 3 mL, 3 mL, Intravenous, PRN, End, Christopher, MD .  tacrolimus (PROGRAF) capsule 1 mg, 1 mg, Oral, BID, End, Christopher, MD, 1 mg at 06/10/20 0825  Patients Current Diet:  Diet Order            Diet Carb Modified Fluid consistency: Thin; Room service appropriate? Yes; Fluid restriction: 2000 mL Fluid  Diet effective now                 Precautions / Restrictions Precautions Precautions: Fall Precaution Comments: L BKA wound back; watch SpO2/HR Other Brace:  limb protector Restrictions Weight Bearing Restrictions: Yes LLE Weight Bearing: Non weight bearing   Has the patient had 2 or more falls or a fall with injury in the past year? 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 Independent  Indoor Mobility: Did the patient need assistance with walking from room to room (with or without device)? Independent  Stairs: Did the patient need assistance with internal or external stairs (with or without device)? Independent  Functional Cognition: Did the patient need help planning regular tasks such as shopping or remembering to take medications? [place] / Equipment Home Assistive Devices/Equipment: Environmental consultant (specify type), CBG Meter (front wheeled walker) Home Equipment: Grab bars - toilet, Grab bars - tub/shower  Prior Device Use: Indicate devices/aids used by the patient prior to current illness, exacerbation or injury? None of the above  Current Functional Level Cognition  Overall Cognitive Status: Within Functional Limits for tasks assessed Orientation  Level: Oriented X4    Extremity Assessment (includes Sensation/Coordination)  Upper Extremity Assessment: Overall WFL for tasks assessed  Lower Extremity Assessment: Defer to PT evaluation LLE Deficits / Details: s/p L BKA; hip and knee functionally at least 3/5    ADLs  Overall ADL's : Needs assistance/impaired Eating/Feeding: Independent Grooming: Minimal assistance, Standing Grooming Details (indicate cue type and reason): sink Upper Body Bathing: Set up, Sitting Lower Body Bathing: Moderate assistance, Sit to/from stand, Sitting/lateral leans Lower Body Bathing Details (indicate cue type and reason): able to perform figure 4 Upper Body Dressing : Set up, Sitting Lower Body Dressing: Moderate assistance, Sitting/lateral leans, Sit to/from stand Toilet Transfer: Minimal assistance, Moderate assistance, Stand-pivot, RW, BSC Toilet Transfer Details (indicate cue type and reason): mod A on initial stand, min A after Toileting- Clothing Manipulation and Hygiene: Moderate assistance, Sit to/from stand, Sitting/lateral lean Functional mobility during ADLs: Minimal assistance, Cueing for sequencing, Cueing for safety, Rolling walker General ADL Comments: Pt completed bed mobility, stood EOB and sidestepped along EOB, seated rest break then SPT to [facility]. Mod A on initial stnad, min A after    Mobility  Overal bed mobility: Modified Independent Bed Mobility: Rolling, Sidelying to Sit Rolling: Modified independent (Device/Increase time) Sidelying to sit: Min guard Supine to sit: Modified independent (Device/Increase time) General bed mobility comments: min guard to powerup from Left sidelying 2/2 L elbow pain.     Transfers  Overall transfer level: Needs assistance Equipment used: Rolling walker (2 wheeled) Transfers: Sit to/from Stand, [employer] Transfers Sit to Stand: Mod assist Stand pivot transfers: Mod assist, Min assist General transfer comment: mod A on intial stand, min A after,  elevated EOB. cues for technique with rw and hand placement.     Ambulation / Gait / Stairs / Wheelchair Mobility  Ambulation/Gait Ambulation/Gait assistance: Mod assist, Min assist Gait Distance (Feet): 10 Feet Assistive device: Rolling walker (2 wheeled) Gait Pattern/deviations: Step-to pattern, Decreased stride length General Gait Details: Slow, fatigued gait with RW; heavy reliance on BUE support to hop on RLE; instability requiring min-modA to prevent posterior LOB; increased RR with activity and SOB with activity limiting progression. He requested to use [facility] prior to his procedure this morning, therefore session limited. Gait velocity: Decreased    Posture / Balance Balance Overall balance assessment: Needs assistance Sitting-balance support: Feet supported Sitting balance-Leahy Scale: Good Standing balance support: Bilateral upper extremity supported, During  functional activity Standing balance-Leahy Scale: Poor Standing balance comment: Dependent on BUE support, reliant on external assist for balance and safety    Special needs/care consideration Skin: pressure injury 11/8 on coccxy stage 1, right heel diabetic ulcer, LLE surgical incision; wound vac to LLE   and Diabetic management : yes   Previous Home Environment (from acute therapy documentation) Living Arrangements: Alone Available Help at Discharge: Family, Available 24 hours/day Type of Home: House Home Layout: One level Home Access: Stairs to enter Entrance Stairs-Rails: None Entrance Stairs-Number of Steps: 1 threshold (1.5" high) Bathroom Shower/Tub: Multimedia programmer: Handicapped height Bathroom Accessibility: Yes How Accessible: Accessible via wheelchair Home Care Services: No  Discharge Living Setting Plans for Discharge Living Setting: Alone, House (separated) Type of Home at Discharge: House Discharge Home Layout: One level Discharge Home Access: Level entry Discharge Bathroom Shower/Tub:  Walk-in shower (grab bars, chair) Discharge Bathroom Toilet: Standard Discharge Bathroom Accessibility: Yes How Accessible: Accessible via walker Does the patient have any problems obtaining your medications?: No  Social/Family/Support Systems Patient Roles: Spouse, Parent (teaches EMT paramedics ) Contact Information: wife: [name] [phone]) Anticipated Caregiver: Wife + grown children + his parents Anticipated Caregiver's Contact Information: see above Ability/Limitations of Caregiver: supervision Caregiver Availability: Intermittent (combo of wife + parents to assist) Discharge Plan Discussed with Primary Caregiver: Yes (pt and his wife) Is Caregiver In Agreement with Plan?: Yes Does Caregiver/Family have Issues with Lodging/Transportation while Pt is in Rehab?: No  Goals Patient/Family Goal for Rehab: PT/OT: Mod I; SLP: NA Expected length of stay: 8-12 days Pt/Family Agrees to Admission and willing to participate: Yes Program Orientation Provided & Reviewed with Pt/Caregiver Including Roles  & Responsibilities: Yes (pt and his wife)  Barriers to Discharge: Lack of/limited family support, Weight bearing restrictions (lives alone; new BKA)  Decrease burden of Care through IP rehab admission: NA  Possible need for SNF placement upon discharge: Not anticipated. Pt has an accessible home and good support from family. Anticipate he can reach a Mod I/Supervision level to return home safely after a CIR stay.   Patient Condition: I have reviewed medical records from [facility], spoken with MD, and patient and spouse. I met with patient at the bedside for inpatient rehabilitation assessment.  Patient will benefit from ongoing PT and OT, can actively participate in 3 hours of therapy a day 5 days of the week, and can make measurable gains during the admission.  Patient will also benefit from the coordinated team approach during an Inpatient Acute Rehabilitation admission.   The patient will receive intensive therapy as well as Rehabilitation physician, nursing, social worker, and care management interventions.  Due to safety, skin/wound care, disease management, medication administration, pain management and patient education the patient requires 24 hour a day rehabilitation nursing.  The patient is currently Mod A for transfers and Min/Mod A for with mobility and Min/mod A for basic ADLs.  Discharge setting and therapy post discharge at home with home health is anticipated.  Patient has agreed to participate in the Acute Inpatient Rehabilitation Program and will admit 06/10/20.  Preadmission Screen Completed By:  Raechel Ache, 06/10/2020 1:04 PM ______________________________________________________________________   Discussed status with Dr. Posey Pronto  on 06/10/20 at 12:20PM and received approval for admission today.  Admission Coordinator:  Raechel Ache, OT, time 1:04PM/Date 06/10/20.   Assessment/Plan: Diagnosis:  L BKA  1. Does the need for close, 24 hr/day Medical supervision in concert with the patient's rehab needs make it unreasonable for  this patient to be served in a less intensive setting? Yes  2. Co-Morbidities requiring supervision/potential complications: obesity, OSA, HTN, hyperlipidemia, DM type 2, ESRD s/p renal transplant in 2006. 3. Due to bowel management, safety, skin/wound care, disease management, pain management and patient education, does the patient require 24 hr/day rehab nursing? Yes 4. Does the patient require coordinated care of a physician, rehab nurse, PT, OT to address physical and functional deficits in the context of the above medical diagnosis(es)? Yes Addressing deficits in the following areas: balance, endurance, locomotion, strength, transferring, bathing, dressing, toileting and psychosocial support 5. Can the patient actively participate in an intensive therapy program of at least 3 hrs of therapy 5 days a week? Yes 6. The  potential for patient to make measurable gains while on inpatient rehab is excellent 7. Anticipated functional outcomes upon discharge from inpatient rehab: modified independent and supervision PT, modified independent and supervision OT, n/a SLP 8. Estimated rehab length of stay to reach the above functional goals is: 9-14 days. 9. Anticipated discharge destination: Home 10. Overall Rehab/Functional Prognosis: good  MD Signature: Delice Lesch, MD, ABPMR

## 2020-06-05 NOTE — Progress Notes (Signed)
PROGRESS NOTE    Steven [name]  GXQ:[id number] DOB: 12/11/1955 DOA: 06/01/2020 PCP: Isaac Bliss, Rayford Halsted, MD    Brief Narrative:  64 year old male with a past medical history of obesity, OSA, hypertension, hyperlipidemia, type 2 diabetes ESRD s/p renal transplant in 2006 on immunosuppressants presented to [facility] ED due to a worsening diabetic pressure ulcer on the bottom of his left foot of 1 to 2 weeks duration. He initially took Keflex without improvement and was switched to Doxy.  No improvement.  Presented to the ED for further evaluation and management of his wound.  Work up revealed left foot osteomyelitis by MRI.  Subjective: -Denies any further dyspnea, no chest pain, reports uncontrolled pain at surgical site.  Assessment & Plan:   Principal Problem:   Osteomyelitis of foot ([place]) Active Problems:   Diabetes mellitus ([place])   Hyperlipidemia   Kidney transplant recipient   Open wound of left foot   Diabetic foot infection ([place])   CKD (chronic kidney disease), stage III (H[facility])  Osteomyelitis of the left foot with cellulitis, failed outpatient antibiotics -Patient initially on broad-spectrum IV antibiotics, IV vancomycin and cefepime, vancomycin later changed to IV linezolid given AKI . -Currently off antibiotics. - MRI left foot is consistent with osteomyelitis. -Orthopedic input greatly appreciated, status post tibial amputation by Dr. Sharol Given on 11/3.  . -PT/OT consulted.   -CIR consulted.  Non oliguric AKI on CKD 3B status post renal transplant on immunosuppressants likely prerenal in the setting of dehydration -Continue with immunosuppressive therapy. -Creatinine at baseline, continue to monitor closely as we will need IV diuresis. - Nephrology has signed off  Acute diastolic CHF/severe aortic stenosis -On Lasix as needed, has mild lower extremity edema -With severe aortic stenosis, new finding, cardiology has been consulted.  Nonhealing bilateral lower  extremity diabetic foot wound Continue local wound care Orthopedic Surgery following, plan for surgery today  Type 2 diabetes on insulin a1c of 9.3 -CBG improved on current regimen of Levemir 15 units daily, 10 nightly, 6 units before meals and sliding scale.   Hyperlipidemia/GERD Continue home regimen  Physical debility PT/OT consulted.   DVT prophylaxis: Heparin subq Code Status: Full Family Communication: none at bedside  Status is: Inpatient  Remains inpatient appropriate because:Ongoing diagnostic testing needed not appropriate for outpatient work up and Inpatient level of care appropriate due to severity of illness   Dispo:  Patient From: Home  Planned Disposition: CIR  Expected discharge date: 06/07/2020  Medically stable for discharge: No        Consultants:   Orthopedic Surgery  Procedures:  Transtibial amputation Application of Prevena wound VAC by Dr. Sharol Given on 11/3.  Antimicrobials: Anti-infectives (From admission, onward)   Start     Dose/Rate Route Frequency Ordered Stop   06/03/20 0800  ceFAZolin (ANCEF) IVPB 2g/100 mL premix        2 g 200 mL/hr over 30 Minutes Intravenous On call to O.R. 06/03/20 0701 06/03/20 1644   06/03/20 0600  vancomycin (VANCOREADY) IVPB 1500 mg/300 mL  Status:  Discontinued        1,500 mg 150 mL/hr over 120 Minutes Intravenous Every 36 hours 06/01/20 1826 06/02/20 1130   06/03/20 0000  ceFEPIme (MAXIPIME) 2 g in sodium chloride 0.9 % 100 mL IVPB        2 g 200 mL/hr over 30 Minutes Intravenous Every 12 hours 06/02/20 1408 06/04/20 1330   06/02/20 1500  ceFEPIme (MAXIPIME) 2 g in sodium chloride 0.9 % 100 mL IVPB  2 g 200 mL/hr over 30 Minutes Intravenous  Once 06/02/20 1408 06/02/20 1653   06/02/20 1400  linezolid (ZYVOX) IVPB 600 mg        600 mg 300 mL/hr over 60 Minutes Intravenous Every 12 hours 06/02/20 1122 06/04/20 2226   06/01/20 1845  vancomycin (VANCOCIN) IVPB 1000 mg/200 mL premix  Status:   Discontinued        1,000 mg 200 mL/hr over 60 Minutes Intravenous  Once 06/01/20 1759 06/01/20 1815   06/01/20 1845  vancomycin (VANCOREADY) IVPB 2000 mg/400 mL        2,000 mg 200 mL/hr over 120 Minutes Intravenous  Once 06/01/20 1816 06/01/20 2042   06/01/20 1045  piperacillin-tazobactam (ZOSYN) IVPB 3.375 g        3.375 g 100 mL/hr over 30 Minutes Intravenous  Once 06/01/20 1043 06/01/20 1237        Objective: Vitals:   06/04/20 1900 06/05/20 0305 06/05/20 0926 06/05/20 1515  BP: 115/63 (!) 125/55 121/65 133/65  Pulse:  76 88 82  Resp: 20 18 20 20   Temp: 98.2 F (36.8 C) 97.8 F (36.6 C) 98.5 F (36.9 C) 98.7 F (37.1 C)  TempSrc: Oral Oral Oral Oral  SpO2: 96% 98% 97% 99%  Weight:      Height:        Intake/Output Summary (Last 24 hours) at 06/05/2020 1518 Last data filed at 06/05/2020 0500 Gross per 24 hour  Intake 780 ml  Output 400 ml  Net 380 ml   Filed Weights   06/01/20 1420 06/03/20 1452  Weight: 111.5 kg 111.5 kg    Examination:  Awake Alert, Oriented X 3, No new F.N deficits, Normal affect Symmetrical Chest wall movement, Good air movement bilaterally, CTAB RRR,No Gallops,Rubs ,+ Murmurs, No Parasternal Heave +ve B.Sounds, Abd Soft, No tenderness, No rebound - guarding or rigidity. No Cyanosis, right lower extremity +1 edema, left status post BKA, on wound VAC.  Data Reviewed: I have personally reviewed following labs and imaging studies  CBC: Recent Labs  Lab 06/01/20 1147 06/02/20 0309 06/03/20 0321 06/04/20 0832 06/05/20 0253  WBC 6.4 5.2 4.7 5.9 5.3  NEUTROABS 5.3  --  3.5  --   --   HGB 10.1* 9.1* 8.8* 9.8* 9.2*  HCT 31.8* 28.7* 28.1* 31.1* 28.5*  MCV 82.6 82.2 82.2 81.2 80.5  PLT 146* 154 150 166 536   Basic Metabolic Panel: Recent Labs  Lab 06/01/20 1147 06/01/20 1147 06/02/20 0309 06/03/20 0321 06/04/20 0832 06/04/20 1349 06/05/20 0253  NA 137  --  135 132* 130*  --  132*  K 4.6  --  4.5 4.2 5.3*  --  4.4  CL 99  --   102 102 99  --  102  CO2 23  --  22 20* 19*  --  19*  GLUCOSE 243*   < > 290* 354* 444* 433* 160*  BUN 40*  --  40* 41* 36*  --  47*  CREATININE 2.07*  --  2.23* 2.22* 2.41*  --  2.49*  CALCIUM 8.9  --  8.5* 8.3* 8.8*  --  8.8*  MG  --   --   --  1.7  --   --   --    < > = values in this interval not displayed.   GFR: Estimated Creatinine Clearance: 39.1 mL/min (A) (by C-G formula based on SCr of 2.49 mg/dL (H)). Liver Function Tests: Recent Labs  Lab 06/01/20 1147 06/03/20 0321  AST 18 15  ALT 27 26  ALKPHOS 116 113  BILITOT 1.2 1.0  PROT 6.9 6.2*  ALBUMIN 3.3* 3.0*   No results for input(s): LIPASE, AMYLASE in the last 168 hours. No results for input(s): AMMONIA in the last 168 hours. Coagulation Profile: No results for input(s): INR, PROTIME in the last 168 hours. Cardiac Enzymes: No results for input(s): CKTOTAL, CKMB, CKMBINDEX, TROPONINI in the last 168 hours. BNP (last 3 results) No results for input(s): PROBNP in the last 8760 hours. HbA1C: No results for input(s): HGBA1C in the last 72 hours. CBG: Recent Labs  Lab 06/04/20 0741 06/04/20 1159 06/04/20 1702 06/05/20 0739 06/05/20 1216  GLUCAP 387* 493* 224* 176* 210*   Lipid Profile: No results for input(s): CHOL, HDL, LDLCALC, TRIG, CHOLHDL, LDLDIRECT in the last 72 hours. Thyroid Function Tests: No results for input(s): TSH, T4TOTAL, FREET4, T3FREE, THYROIDAB in the last 72 hours. Anemia Panel: No results for input(s): VITAMINB12, FOLATE, FERRITIN, TIBC, IRON, RETICCTPCT in the last 72 hours. Sepsis Labs: Recent Labs  Lab 06/01/20 1147  LATICACIDVEN 1.1    Recent Results (from the past 240 hour(s))  Respiratory Panel by RT PCR (Flu A&B, Covid) - Nasopharyngeal Swab     Status: None   Collection Time: 06/01/20 11:02 AM   Specimen: Nasopharyngeal Swab  Result Value Ref Range Status   SARS Coronavirus 2 by RT PCR NEGATIVE NEGATIVE Final    Comment: (NOTE) SARS-CoV-2 target nucleic acids are NOT  DETECTED.  The SARS-CoV-2 RNA is generally detectable in upper respiratoy specimens during the acute phase of infection. The lowest concentration of SARS-CoV-2 viral copies this assay can detect is 131 copies/mL. A negative result does not preclude SARS-Cov-2 infection and should not be used as the sole basis for treatment or other patient management decisions. A negative result may occur with  improper specimen collection/handling, submission of specimen other than nasopharyngeal swab, presence of viral mutation(s) within the areas targeted by this assay, and inadequate number of viral copies (<131 copies/mL). A negative result must be combined with clinical observations, patient history, and epidemiological information. The expected result is Negative.  Fact Sheet for Patients:  PinkCheek.be  Fact Sheet for Healthcare Providers:  GravelBags.it  This test is no t yet approved or cleared by the [place] FDA and  has been authorized for detection and/or diagnosis of SARS-CoV-2 by FDA under an Emergency Use Authorization (EUA). This EUA will remain  in effect (meaning this test can be used) for the duration of the COVID-19 declaration under Section 564(b)(1) of the Act, 21 U.S.C. section 360bbb-3(b)(1), unless the authorization is terminated or revoked sooner.     Influenza A by PCR NEGATIVE NEGATIVE Final   Influenza B by PCR NEGATIVE NEGATIVE Final    Comment: (NOTE) The Xpert Xpress SARS-CoV-2/FLU/RSV assay is intended as an aid in  the diagnosis of influenza from Nasopharyngeal swab specimens and  should not be used as a sole basis for treatment. Nasal washings and  aspirates are unacceptable for Xpert Xpress SARS-CoV-2/FLU/RSV  testing.  Fact Sheet for Patients: PinkCheek.be  Fact Sheet for Healthcare Providers: GravelBags.it  This test is not yet  approved or cleared by the [place] FDA and  has been authorized for detection and/or diagnosis of SARS-CoV-2 by  FDA under an Emergency Use Authorization (EUA). This EUA will remain  in effect (meaning this test can be used) for the duration of the  Covid-19 declaration under Section 564(b)(1) of the Act, 21  U.S.C.  section 360bbb-3(b)(1), unless the authorization is  terminated or revoked. Performed at [facility], [place] [address], [place], [place] [address]   Culture, blood (routine x 2)     Status: None (Preliminary result)   Collection Time: 06/01/20 11:47 AM   Specimen: BLOOD  Result Value Ref Range Status   Specimen Description   Final    BLOOD RIGHT ANTECUBITAL Performed at [place] [address], [place], [place] [address]    Special Requests   Final    BOTTLES DRAWN AEROBIC AND ANAEROBIC Blood Culture results may not be optimal due to an excessive volume of blood received in culture bottles Performed at [place] [address] [place], [place] [address]    Culture   Final    NO GROWTH 4 DAYS Performed at [facility], [place] [address] [place], [place] [address]    Report Status PENDING  Incomplete  Culture, blood (routine x 2)     Status: None (Preliminary result)   Collection Time: 06/01/20 11:47 AM   Specimen: BLOOD  Result Value Ref Range Status   Specimen Description   Final    BLOOD LEFT ANTECUBITAL Performed at [place] [address] [place], [place] [address]    Special Requests   Final    BOTTLES DRAWN AEROBIC AND ANAEROBIC Blood Culture results may not be optimal due to an excessive volume of blood received in culture bottles Performed at [place] [address], [place], [place] [address]    Culture   Final    NO GROWTH 4 DAYS Performed at [facility], [place] [address], [place], [place] [address]    Report Status PENDING   Incomplete  MRSA PCR Screening     Status: None   Collection Time: 06/02/20  7:00 AM   Specimen: Nasopharyngeal  Result Value Ref Range Status   MRSA by PCR NEGATIVE NEGATIVE Final    Comment:        The GeneXpert MRSA Assay (FDA approved for NASAL specimens only), is one component of a comprehensive MRSA colonization surveillance program. It is not intended to diagnose MRSA infection nor to guide or monitor treatment for MRSA infections. Performed at [facility], [place] [address] [place], [place] [address]      Radiology Studies: [facility] Chest Port 1 View  Result Date: 06/04/2020 CLINICAL DATA:  Acute respiratory distress EXAM: PORTABLE CHEST 1 VIEW COMPARISON:  12/21/2019 FINDINGS: Heart is normal size. Vascular congestion. Perihilar opacities and interstitial prominence. No effusions. No acute bony abnormality. IMPRESSION: Vascular congestion and perihilar opacities with interstitial prominence may reflect edema or infection. Electronically Signed   By: Rolm Baptise M.D.   On: 06/04/2020 03:17   ECHOCARDIOGRAM COMPLETE  Result Date: 06/04/2020    ECHOCARDIOGRAM REPORT   Patient Name:   Steven [name] [facility] Date of Exam: 06/04/2020 Medical Rec #:  [id number]           Height:       71.0 in Accession #:    [id number]          Weight:       245.8 lb Date of Birth:  Sep 11, 1955           BSA:          2.302 m Patient Age:    17 years            BP:  115/67 mmHg Patient Gender: M                   HR:           67 bpm. Exam Location:  Inpatient Procedure: 2D Echo, 3D Echo, Cardiac Doppler, Color Doppler and Intracardiac            Opacification Agent REPORT CONTAINS CRITICAL RESULT Indications:    R01.1 Murmur  History:        Patient has no prior history of Echocardiogram examinations.                 Signs/Symptoms:Shortness of Breath and Dyspnea; Risk                 Factors:Sleep Apnea, Hypertension and Diabetes. Leg amputation                 on previous day. Kidney  transplant.  Sonographer:    Roseanna Rainbow RDCS Referring Phys: [id number]nece Gall S [facility]  Sonographer Comments: Technically difficult study due to poor echo windows and patient is morbidly obese. Image acquisition challenging due to patient body habitus. IMPRESSIONS  1. Severely calcified aortic and mitral valves with severe aortic stenosis and mild mitral stenosis.  2. Left ventricular ejection fraction, by estimation, is 60 to 65%. The left ventricle has normal function. The left ventricle has no regional wall motion abnormalities. There is moderate concentric left ventricular hypertrophy. Left ventricular diastolic parameters are consistent with Grade II diastolic dysfunction (pseudonormalization). Elevated left atrial pressure.  3. Right ventricular systolic function is normal. The right ventricular size is normal.  4. The mitral valve is normal in structure. Mild mitral valve regurgitation. Mild mitral stenosis. Moderate mitral annular calcification.  5. The aortic valve is calcified. There is severe calcifcation of the aortic valve. There is severe thickening of the aortic valve. Aortic valve regurgitation is not visualized. Severe aortic valve stenosis. Aortic valve mean gradient measures 67.0 mmHg.  6. The inferior vena cava is normal in size with greater than 50% respiratory variability, suggesting right atrial pressure of 3 mmHg. FINDINGS  Left Ventricle: Left ventricular ejection fraction, by estimation, is 60 to 65%. The left ventricle has normal function. The left ventricle has no regional wall motion abnormalities. Definity contrast agent was given IV to delineate the left ventricular  endocardial borders. The left ventricular internal cavity size was normal in size. There is moderate concentric left ventricular hypertrophy. Left ventricular diastolic parameters are consistent with Grade II diastolic dysfunction (pseudonormalization).  Elevated left atrial pressure. Right Ventricle: The right ventricular  size is normal. No increase in right ventricular wall thickness. Right ventricular systolic function is normal. Left Atrium: Left atrial size was normal in size. Right Atrium: Right atrial size was normal in size. Pericardium: There is no evidence of pericardial effusion. Mitral Valve: The mitral valve is normal in structure. There is severe thickening of the mitral valve leaflet(s). There is severe calcification of the mitral valve leaflet(s). Moderate mitral annular calcification. Mild mitral valve regurgitation. Mild mitral valve stenosis. MV peak gradient, 13.7 mmHg. The mean mitral valve gradient is 4.0 mmHg. Tricuspid Valve: The tricuspid valve is normal in structure. Tricuspid valve regurgitation is not demonstrated. No evidence of tricuspid stenosis. Aortic Valve: The aortic valve is calcified. There is severe calcifcation of the aortic valve. There is severe thickening of the aortic valve. Aortic valve regurgitation is not visualized. Severe aortic stenosis is present. Aortic valve mean gradient measures 67.0 mmHg. Aortic valve  peak gradient measures 108.4 mmHg. Aortic valve area, by VTI measures 4.16 cm. Pulmonic Valve: The pulmonic valve was normal in structure. Pulmonic valve regurgitation is not visualized. No evidence of pulmonic stenosis. Aorta: The aortic root is normal in size and structure. Venous: The inferior vena cava is normal in size with greater than 50% respiratory variability, suggesting right atrial pressure of 3 mmHg. IAS/Shunts: No atrial level shunt detected by color flow Doppler.  LEFT VENTRICLE PLAX 2D LVIDd:         4.90 cm     Diastology LVIDs:         3.70 cm     LV e' medial:    4.43 cm/s LV PW:         1.60 cm     LV E/e' medial:  35.7 LV IVS:        1.20 cm     LV e' lateral:   5.37 cm/s LVOT diam:     2.40 cm     LV E/e' lateral: 29.4 LV SV:         525 LV SV Index:   228 LVOT Area:     4.52 cm  LV Volumes (MOD) LV vol d, MOD A2C: 79.6 ml LV vol d, MOD A4C: 99.0 ml LV vol s,  MOD A2C: 18.7 ml LV vol s, MOD A4C: 42.3 ml LV SV MOD A2C:     60.9 ml LV SV MOD A4C:     99.0 ml LV SV MOD BP:      61.7 ml RIGHT VENTRICLE            IVC RV S prime:     9.65 cm/s  IVC diam: 2.50 cm TAPSE (M-mode): 2.4 cm LEFT ATRIUM             Index       RIGHT ATRIUM           Index LA diam:        3.90 cm 1.69 cm/m  RA Area:     15.90 cm LA Vol (A2C):   57.4 ml 24.94 ml/m RA Volume:   39.40 ml  17.12 ml/m LA Vol (A4C):   27.0 ml 11.73 ml/m LA Biplane Vol: 40.0 ml 17.38 ml/m  AORTIC VALVE AV Area (Vmax):    4.52 cm AV Area (Vmean):   4.45 cm AV Area (VTI):     4.16 cm AV Vmax:           520.60 cm/s AV Vmean:          371.000 cm/s AV VTI:            1.262 m AV Peak Grad:      108.4 mmHg AV Mean Grad:      67.0 mmHg LVOT Vmax:         520.00 cm/s LVOT Vmean:        365.000 cm/s LVOT VTI:          1.160 m LVOT/AV VTI ratio: 0.92  AORTA Ao Root diam: 3.60 cm Ao Asc diam:  3.30 cm MITRAL VALVE MV Area (PHT): 2.37 cm     SHUNTS MV Peak grad:  13.7 mmHg    Systemic VTI:  1.16 m MV Mean grad:  4.0 mmHg     Systemic Diam: 2.40 cm MV Vmax:       1.85 m/s MV Vmean:      85.8 cm/s MV Decel Time: 320 msec MV E velocity: 158.00 cm/s  MV A velocity: 94.90 cm/s MV E/A ratio:  1.66 Ena Dawley MD Electronically signed by Ena Dawley MD Signature Date/Time: 06/04/2020/4:31:23 PM    Final     Scheduled Meds: . (feeding supplement) PROSource Plus  30 mL Oral TID BM  . atorvastatin  10 mg Oral Daily  . docusate sodium  100 mg Oral BID  . fenofibrate  54 mg Oral Daily  . glyBURIDE  5 mg Oral BID WC  . heparin  5,000 Units Subcutaneous Q8H  . insulin aspart  0-15 Units Subcutaneous TID WC  . insulin aspart  6 Units Subcutaneous TID WC  . insulin detemir  10 Units Subcutaneous QHS  . insulin detemir  15 Units Subcutaneous Daily  . living well with diabetes book   Does not apply Once  . mycophenolate  360 mg Oral BID  . pantoprazole  40 mg Oral Daily  . tacrolimus  1 mg Oral BID   Continuous  Infusions: . sodium chloride 250 mL (06/01/20 1838)     LOS: 4 days   Phillips Climes, MD [facility] Pager On Amion  If 7PM-7AM, please contact night-coverage 06/05/2020, 3:18 PM

## 2020-06-05 NOTE — Progress Notes (Signed)
Patient is postop day 2 status post below-knee amputation.  He is laying in bed.  He does say that he has moderate discomfort.   Vital signs stable alert pleasant canister is not appropriate right now to [facility] but is functioning.  0 cc  Postop day 2 status post below-knee amputation.  Patient was requesting stronger pain medication.  I explained to him that oxycodone was the strongest from our perspective that could be prescribed to him at this time.  We do not prescribe long-acting narcotic such as OxyContin. Patient was recommended to inpatient rehab.  Consultation was placed.  From an orthopedic standpoint he could be transferred there but he is still undergoing medical work-up.

## 2020-06-05 NOTE — Progress Notes (Signed)
Inpatient Rehabilitation-Admissions Coordinator   Met with pt bedside for rehab assessment. Pt c/o pain in LLE; RN notified. Feel pt is a good candidate for CIR once pain more controlled. Discussed recommended rehab program including expectations, ELOS, expected functional outcomes. Pt wants to pursue this program. [facility] awaiting call back from pt's wife to confirm DC plans. As pt not medically ready today, AC will begin insurance auth process Monday per insurance guidelines.   Raechel Ache, OTR/L  Rehab Admissions Coordinator  [phone] 06/05/2020 1:08 PM

## 2020-06-05 NOTE — Consult Note (Addendum)
[facility]VE TEAM  Cardiology Consultation:   Patient ID: Steven [name] MRN: [id number]; DOB: 03-07-1956  Admit date: 06/01/2020 Date of Consult: 06/05/2020  Primary Care Provider: Isaac Bliss, Rayford Halsted, MD [facility] Cardiologist: New to [facility]- Dr. Alger[facility] Electrophysiologist:  None   Patient Profile:   Steven [name] is a 64 y.o. male with a hx of HTN, HLD, IDDM, morbid obesity, OSA, anemia of chronic disease, ESRD s/p renal transplantation (2006) with CKD stage IV, PAD, and severe AS who is being seen today for the evaluation of severe AS at the request of Dr. Mia Creek.  History of Present Illness:   Steven [name] lives in [place], [place]. He is separated but has 4 children that he is close to and live locally. He is a retired Dance movement psychotherapist. Chronic knee pain from competitive sports with heavy NSAID use and uncontrolled diabetes led to ESRD requiring renal transplantation at [facility] in 2006. He is followed by Dr [name] in nephrology who manages his chronic immunosuppressive medications. He has been told he had a heart murmur for at least 20 years but never formally evaluated by cardiology. 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 He drives and takes care of all of his own ADLs. He is able to walk without the aid of a walker or cane, but recently developed a left foot ulcer that has limited his mobility. Of note, the patient has poor dentition and needs full dental extraction. He was waiting to change insurance plans before getting this taken care of. Also, he is fully vaccinated for covid 19.   He presented to [facility] ED on 11/1 for evaluation of his non healing diabetic pressure ulcer on left foot. He failed outpatient antibiotics and developed low grade fevers. Inpatient work up revealed osteomyelitis and required tibial amputation on 11/3. The  patient developed volume overload that improved with IV lasix. BNP 1403, CXR with CHF, creat around baseline 2.1-2.4. Echo 11/4 showed EF 60-65%, moderate concentric LVH, G2DD, mild mitral stenosis, and severe AS with a mean gradient of 67 mm hg, AVA 4.52 cm2, SVI 0.92 (these are inaccurate due to mismeasured LVOT). Due to severe AS, structural heart is consulted for AVR options.   The patient is seen sitting up in bed. He mostly complains of left LE pain that is now finally controlled with dilaudid. He is now comfortable in terms of breathing after iv lasix. He admits to insidious, progressive dyspnea on exertion and fatigue over the past 6-12 months. He just does not have the energy he used to. He occasionally gets LE edema for which he has PRN lasix. He denies orthopnea or PND and sleeps with a CPAP. He does report mild exertional chest tightness with long walks that resolve with rest. He occasionally has some mild lightheadedness with exertion but no syncope. Plan is to discharge him to CIR when pain better controlled. Insurance approval process to start Monday.   Past Medical History:  Diagnosis Date  . Cataract    removed by surgery  . Diabetes ([place])    type 2  . Diabetic glomerulosclerosis [facility])    [facility] 03/21/12 by Dr. Corliss Parish.  . GERD (gastroesophageal reflux disease)   . HLD (hyperlipidemia)   . Hyperlipidemia   . Hyperparathyroidism ([place])   . Hypertension    Patient denies this DX, no meds  . Kidney transplant recipient   .  Obesity   . Sleep apnea    uses CPAP nightly  . Venous insufficiency   . Vitamin D deficiency     Past Surgical History:  Procedure Laterality Date  . AMPUTATION Right 07/19/2017   Procedure: AMPUTATION RIGHT 2ND TOE;  Surgeon: Leandrew Koyanagi, MD;  Location: [place];  Service: Orthopedics;  Laterality: Right;  . AMPUTATION Left 06/03/2020   Procedure: LEFT BELOW KNEE AMPUTATION;  Surgeon: Newt Minion, MD;  Location: [place];   Service: Orthopedics;  Laterality: Left;  . COLONOSCOPY     greater than 10 yrs ago  . EYE SURGERY Bilateral    cataracts removed  . KIDNEY TRANSPLANT  2006  . NEPHRECTOMY TRANSPLANTED ORGAN  2006  . UPPER GASTROINTESTINAL ENDOSCOPY    . WISDOM TOOTH EXTRACTION       Home Medications:  Prior to Admission medications   Medication Sig Start Date End Date Taking? Authorizing Provider  acetaminophen (TYLENOL) 500 MG tablet Take 500 mg by mouth every 6 (six) hours as needed for mild pain or fever.   Yes [provider]  atorvastatin (LIPITOR) 10 MG tablet Take 10 mg by mouth daily.  07/07/17  Yes [provider]  doxycycline (VIBRAMYCIN) 100 MG capsule Take 1 capsule (100 mg total) by mouth 2 (two) times daily. 05/29/20  Yes Yu, Amy V, PA-C  fenofibrate (TRICOR) 48 MG tablet Take 48 mg by mouth daily.   Yes [provider]  furosemide (LASIX) 40 MG tablet Take 40 mg by mouth daily as needed for fluid (swelling).  07/06/17  Yes [provider]  glyBURIDE (DIABETA) 5 MG tablet Take 5 mg by mouth 2 (two) times daily with a meal.    Yes [provider]  insulin aspart (NOVOLOG) 100 UNIT/ML injection Inject 0-30 Units into the skin 3 (three) times daily as needed for high blood sugar. Sliding scale    Yes [provider]  insulin detemir (LEVEMIR) 100 UNIT/ML FlexPen Inject 10 Units into the skin daily. 01/30/20  Yes Nicolette Bang, DO  mycophenolate (MYFORTIC) 360 MG TBEC Take 360 mg by mouth 2 (two) times daily.   Yes [provider]  omeprazole (PRILOSEC) 20 MG capsule Take 1 capsule (20 mg total) by mouth daily. 01/30/20  Yes Nicolette Bang, DO  oxyCODONE (OXY IR/ROXICODONE) 5 MG immediate release tablet Take 1 tablet (5 mg total) by mouth every 4 (four) hours as needed for severe pain. 02/21/20  Yes Isaac Bliss, Rayford Halsted, MD  tacrolimus (PROGRAF) 1 MG capsule Take 1 mg by mouth 2 (two) times daily.    Yes [provider]  Vitamin D, Ergocalciferol, (DRISDOL) 1.25 MG (50000 UNIT) CAPS capsule TAKE 1 CAPSULE (50,000 UNITS TOTAL) BY MOUTH EVERY 7 (SEVEN) DAYS FOR 12 DOSES. Patient taking differently: Take 50,000 Units by mouth every 7 (seven) days. Wednesday 05/13/20 07/30/20 Yes Erline Hau, MD  cephALEXin (KEFLEX) 500 MG capsule Take 1 capsule (500 mg total) by mouth 4 (four) times daily. Patient not taking: Reported on 06/01/2020 05/24/20   Chevis Pretty, FNP  Insulin Pen Needle (BD PEN NEEDLE NANO 2ND GEN) 32G X 4 MM MISC Use daily for glucose control 03/11/20   Isaac Bliss, Rayford Halsted, MD  Insulin Pen Needle 32G X 4 MM MISC Use as directed with insulin pen 12/24/19   Geradine Girt, DO  ONETOUCH VERIO test strip 1 each by Other route 3 (three) times daily. 01/20/20   [provider]  Inpatient Medications: Scheduled Meds: . (feeding supplement) PROSource Plus  30 mL Oral TID BM  . atorvastatin  10 mg Oral Daily  . docusate sodium  100 mg Oral BID  . fenofibrate  54 mg Oral Daily  . glyBURIDE  5 mg Oral BID WC  . heparin  5,000 Units Subcutaneous Q8H  . insulin aspart  0-15 Units Subcutaneous TID WC  . insulin aspart  6 Units Subcutaneous TID WC  . insulin detemir  10 Units Subcutaneous QHS  . insulin detemir  15 Units Subcutaneous Daily  . mycophenolate  360 mg Oral BID  . pantoprazole  40 mg Oral Daily  . tacrolimus  1 mg Oral BID   Continuous Infusions: . sodium chloride 250 mL (06/01/20 1838)   PRN Meds: sodium chloride, acetaminophen **OR** acetaminophen, furosemide, HYDROmorphone (DILAUDID) injection, metoCLOPramide **OR** metoCLOPramide (REGLAN) injection, ondansetron **OR** ondansetron (ZOFRAN) IV, oxyCODONE, polyethylene glycol  Allergies:    Allergies  Allergen Reactions  . Sulfa Antibiotics Rash    Social History:   Social History   Socioeconomic History  . Marital status: Legally Separated    Spouse name: Not on file  . Number  of children: Not on file  . Years of education: Not on file  . Highest education level: Not on file  Occupational History  . Occupation: trooper  Tobacco Use  . Smoking status: Never Smoker  . Smokeless tobacco: Former Systems developer    Types: Secondary school teacher  . Vaping Use: Never used  Substance and Sexual Activity  . Alcohol use: No  . Drug use: No  . Sexual activity: Yes  Other Topics Concern  . Not on file  Social History Narrative  . Not on file   Social Determinants of Health   Financial Resource Strain:   . Difficulty of Paying Living Expenses: Not on file  Food Insecurity:   . Worried About Charity fundraiser in the Last Year: Not on file  . Ran Out of Food in the Last Year: Not on file  Transportation Needs:   . Lack of Transportation (Medical): Not on file  . Lack of Transportation (Non-Medical): Not on file  Physical Activity:   . Days of Exercise per Week: Not on file  . Minutes of Exercise per Session: Not on file  Stress:   . Feeling of Stress : Not on file  Social Connections:   . Frequency of Communication with Friends and Family: Not on file  . Frequency of Social Gatherings with Friends and Family: Not on file  . Attends Religious Services: Not on file  . Active Member of Clubs or Organizations: Not on file  . Attends Archivist Meetings: Not on file  . Marital Status: Not on file  Intimate Partner Violence:   . Fear of Current or Ex-Partner: Not on file  . Emotionally Abused: Not on file  . Physically Abused: Not on file  . Sexually Abused: Not on file    Family History:   Family History  Problem Relation Age of Onset  . Healthy Mother   . Healthy Father   . Colon cancer Neg Hx   . Rectal cancer Neg Hx   . Stomach cancer Neg Hx      ROS:  Please see the history of present illness.  All other ROS reviewed and negative.     Physical Exam/Data:   Vitals:   06/04/20 1707 06/04/20 1900 06/05/20 0305 06/05/20 0926  BP: (!) 111/52 115/63  Marland Kitchen)  125/55 121/65  Pulse: 71  76 88  Resp: 20 20 18 20   Temp: 98.1 F (36.7 C) 98.2 F (36.8 C) 97.8 F (36.6 C) 98.5 F (36.9 C)  TempSrc: Oral Oral Oral Oral  SpO2: 100% 96% 98% 97%  Weight:      Height:        Intake/Output Summary (Last 24 hours) at 06/05/2020 1401 Last data filed at 06/05/2020 0500 Gross per 24 hour  Intake 780 ml  Output 400 ml  Net 380 ml   Last 3 Weights 06/03/2020 06/01/2020 03/27/2020  Weight (lbs) 245 lb 13 oz 245 lb 13 oz 245 lb 12.8 oz  Weight (kg) 111.5 kg 111.5 kg 111.494 kg     Body mass index is 34.3 kg/m.  General:  Well nourished, well developed, in no acute distress HEENT: normal Lymph: no adenopathy  Cardiac:  normal S1, S2; RRR; 3/6 SEM heard best at RUSB.  Lungs:  clear to auscultation bilaterally, no wheezing, rhonchi or rales Abd: soft, nontender, no hepatomegaly  Ext: s/p Left BKA, trace right pretibial edema with chronic venous stasis changes Musculoskeletal:  No deformities, BUE and BLE strength normal and equal Skin: warm and dry  Neuro:  CNs 2-12 intact, no focal abnormalities noted Psych:  Normal affect   EKG:  The EKG was personally reviewed and demonstrates:  Sinus tachy HR 106, LVH with repol abnormality, ILBBB   Relevant CV Studies: IMPRESSIONS  1. Severely calcified aortic and mitral valves with severe aortic  stenosis and mild mitral stenosis.  2. Left ventricular ejection fraction, by estimation, is 60 to 65%. The  left ventricle has normal function. The left ventricle has no regional  wall motion abnormalities. There is moderate concentric left ventricular  hypertrophy. Left ventricular  diastolic parameters are consistent with Grade II diastolic dysfunction  (pseudonormalization). Elevated left atrial pressure.  3. Right ventricular systolic function is normal. The right ventricular  size is normal.  4. The mitral valve is normal in structure. Mild mitral valve  regurgitation. Mild mitral stenosis. Moderate  mitral annular  calcification.  5. The aortic valve is calcified. There is severe calcifcation of the  aortic valve. There is severe thickening of the aortic valve. Aortic valve  regurgitation is not visualized. Severe aortic valve stenosis. Aortic  valve mean gradient measures 67.0  mmHg.  6. The inferior vena cava is normal in size with greater than 50%  respiratory variability, suggesting right atrial pressure of 3 mmHg.   FINDINGS  Left Ventricle: Left ventricular ejection fraction, by estimation, is 60  to 65%. The left ventricle has normal function. The left ventricle has no  regional wall motion abnormalities. Definity contrast agent was given IV  to delineate the left ventricular  endocardial borders. The left ventricular internal cavity size was normal  in size. There is moderate concentric left ventricular hypertrophy. Left  ventricular diastolic parameters are consistent with Grade II diastolic  dysfunction (pseudonormalization).  Elevated left atrial pressure.   Right Ventricle: The right ventricular size is normal. No increase in  right ventricular wall thickness. Right ventricular systolic function is  normal.   Left Atrium: Left atrial size was normal in size.   Right Atrium: Right atrial size was normal in size.   Pericardium: There is no evidence of pericardial effusion.   Mitral Valve: The mitral valve is normal in structure. There is severe  thickening of the mitral valve leaflet(s). There is severe calcification  of the mitral valve leaflet(s). Moderate mitral  annular calcification.  Mild mitral valve regurgitation. Mild  mitral valve stenosis. MV peak gradient, 13.7 mmHg. The mean mitral valve  gradient is 4.0 mmHg.   Tricuspid Valve: The tricuspid valve is normal in structure. Tricuspid  valve regurgitation is not demonstrated. No evidence of tricuspid  stenosis.   Aortic Valve: The aortic valve is calcified. There is severe calcifcation  of the  aortic valve. There is severe thickening of the aortic valve.  Aortic valve regurgitation is not visualized. Severe aortic stenosis is  present. Aortic valve mean gradient  measures 67.0 mmHg. Aortic valve peak gradient measures 108.4 mmHg. Aortic  valve area, by VTI measures 4.16 cm.   Pulmonic Valve: The pulmonic valve was normal in structure. Pulmonic valve  regurgitation is not visualized. No evidence of pulmonic stenosis.   Aorta: The aortic root is normal in size and structure.   Venous: The inferior vena cava is normal in size with greater than 50%  respiratory variability, suggesting right atrial pressure of 3 mmHg.   IAS/Shunts: No atrial level shunt detected by color flow Doppler.     LEFT VENTRICLE  PLAX 2D  LVIDd:     4.90 cm   Diastology  LVIDs:     3.70 cm   LV e' medial:  4.43 cm/s  LV PW:     1.60 cm   LV E/e' medial: 35.7  LV IVS:    1.20 cm   LV e' lateral:  5.37 cm/s  LVOT diam:   2.40 cm   LV E/e' lateral: 29.4  LV SV:     525  LV SV Index:  228  LVOT Area:   4.52 cm    LV Volumes (MOD)  LV vol d, MOD A2C: 79.6 ml  LV vol d, MOD A4C: 99.0 ml  LV vol s, MOD A2C: 18.7 ml  LV vol s, MOD A4C: 42.3 ml  LV SV MOD A2C:   60.9 ml  LV SV MOD A4C:   99.0 ml  LV SV MOD BP:   61.7 ml   RIGHT VENTRICLE      IVC  RV S prime:   9.65 cm/s IVC diam: 2.50 cm  TAPSE (M-mode): 2.4 cm   LEFT ATRIUM       Index    RIGHT ATRIUM      Index  LA diam:    3.90 cm 1.69 cm/m RA Area:   15.90 cm  LA Vol (A2C):  57.4 ml 24.94 ml/m RA Volume:  39.40 ml 17.12 ml/m  LA Vol (A4C):  27.0 ml 11.73 ml/m  LA Biplane Vol: 40.0 ml 17.38 ml/m  AORTIC VALVE  AV Area (Vmax):  4.52 cm  AV Area (Vmean):  4.45 cm  AV Area (VTI):   4.16 cm  AV Vmax:      520.60 cm/s  AV Vmean:     371.000 cm/s  AV VTI:      1.262 m  AV Peak Grad:   108.4 mmHg  AV Mean Grad:   67.0 mmHg    LVOT Vmax:     520.00 cm/s  LVOT Vmean:    365.000 cm/s  LVOT VTI:     1.160 m  LVOT/AV VTI ratio: 0.92    AORTA  Ao Root diam: 3.60 cm  Ao Asc diam: 3.30 cm   MITRAL VALVE  MV Area (PHT): 2.37 cm   SHUNTS  MV Peak grad: 13.7 mmHg  Systemic VTI: 1.16 m  MV Mean grad: 4.0 mmHg  Systemic Diam: 2.40 cm  MV Vmax:    1.85 m/s  MV Vmean:   85.8 cm/s  MV Decel Time: 320 msec  MV E velocity: 158.00 cm/s  MV A velocity: 94.90 cm/s  MV E/A ratio: 1.66   Ena Dawley MD  Electronically signed by Ena Dawley MD  Signature Date/Time: 06/04/2020/4:31:23 PM    Laboratory Data:  High Sensitivity Troponin:  No results for input(s): TROPONINIHS in the last 720 hours.   Chemistry Recent Labs  Lab 06/03/20 0321 06/03/20 0321 06/04/20 0832 06/04/20 1349 06/05/20 0253  NA 132*  --  130*  --  132*  K 4.2  --  5.3*  --  4.4  CL 102  --  99  --  102  CO2 20*  --  19*  --  19*  GLUCOSE 354*   < > 444* 433* 160*  BUN 41*  --  36*  --  47*  CREATININE 2.22*  --  2.41*  --  2.49*  CALCIUM 8.3*  --  8.8*  --  8.8*  GFRNONAA 33*  --  30*  --  28*  ANIONGAP 10  --  12  --  11   < > = values in this interval not displayed.    Recent Labs  Lab 06/01/20 1147 06/03/20 0321  PROT 6.9 6.2*  ALBUMIN 3.3* 3.0*  AST 18 15  ALT 27 26  ALKPHOS 116 113  BILITOT 1.2 1.0   Hematology Recent Labs  Lab 06/03/20 0321 06/04/20 0832 06/05/20 0253  WBC 4.7 5.9 5.3  RBC 3.42* 3.83* 3.54*  HGB 8.8* 9.8* 9.2*  HCT 28.1* 31.1* 28.5*  MCV 82.2 81.2 80.5  MCH 25.7* 25.6* 26.0  MCHC 31.3 31.5 32.3  RDW 13.9 13.8 13.9  PLT 150 166 162   BNP Recent Labs  Lab 06/04/20 0832  BNP 1,403.3*    DDimer No results for input(s): DDIMER in the last 168 hours.   Radiology/Studies:  MR FOOT LEFT W WO CONTRAST  Result Date: 06/02/2020 CLINICAL DATA:  Pain, swelling and diabetic ulcer EXAM: MRI OF THE LEFT FOREFOOT WITHOUT AND WITH CONTRAST TECHNIQUE: Multiplanar,  multisequence MR imaging of the left foot was performed both before and after administration of intravenous contrast. CONTRAST:  2mL GADAVIST GADOBUTROL 1 MMOL/ML IV SOLN COMPARISON:  Radiographs 06/01/2020 FINDINGS: There is an open wound along the plantar and lateral aspect of the foot at the level of the base of the fifth metatarsal. Significant focal/localized cellulitis and extensive rim enhancing abscess. This measures approximately 5.0 x 4.8 cm. Underlying osteomyelitis involving the base of the fifth metatarsal. The peroneus brevis tendon is intact. No pathologic fracture. No other sites of osteomyelitis are identified. No findings for septic arthritis involving the fifth metatarsal cuboid joint. Extensive myositis involving the short flexor muscles of the foot without findings for pyomyositis. There is moderate plantar fasciitis. The tibiotalar and subtalar joints are maintained. Sinus tarsi is unremarkable. IMPRESSION: 1. Open wound along the plantar and lateral aspect of the foot at the level of the base of the fifth metatarsal. Associated 5.0 x 4.8 cm subcutaneous abscess. 2. Underlying osteomyelitis involving the base of the fifth metatarsal. 3. Extensive myositis involving the short flexor muscles of the foot without findings for pyomyositis. 4. Moderate plantar fasciitis. Electronically Signed   By: Marijo Sanes M.D.   On: 06/02/2020 09:47   DG Chest Port 1 View  Result Date: 06/04/2020 CLINICAL DATA:  Acute respiratory distress EXAM: PORTABLE CHEST 1  VIEW COMPARISON:  12/21/2019 FINDINGS: Heart is normal size. Vascular congestion. Perihilar opacities and interstitial prominence. No effusions. No acute bony abnormality. IMPRESSION: Vascular congestion and perihilar opacities with interstitial prominence may reflect edema or infection. Electronically Signed   By: Rolm Baptise M.D.   On: 06/04/2020 03:17   ECHOCARDIOGRAM COMPLETE  Result Date: 06/04/2020    ECHOCARDIOGRAM REPORT   Patient  Name:   Steven [name] [facility] Date of Exam: 06/04/2020 Medical Rec #:  [id number]           Height:       71.0 in Accession #:    [id number]          Weight:       245.8 lb Date of Birth:  24-Mar-1956           BSA:          2.302 m Patient Age:    36 years            BP:           115/67 mmHg Patient Gender: M                   HR:           67 bpm. Exam Location:  Inpatient Procedure: 2D Echo, 3D Echo, Cardiac Doppler, Color Doppler and Intracardiac            Opacification Agent REPORT CONTAINS CRITICAL RESULT Indications:    R01.1 Murmur  History:        Patient has no prior history of Echocardiogram examinations.                 Signs/Symptoms:Shortness of Breath and Dyspnea; Risk                 Factors:Sleep Apnea, Hypertension and Diabetes. Leg amputation                 on previous day. Kidney transplant.  Sonographer:    Roseanna Rainbow RDCS Referring Phys: [id number] DAWOOD S [facility]  Sonographer Comments: Technically difficult study due to poor echo windows and patient is morbidly obese. Image acquisition challenging due to patient body habitus. IMPRESSIONS  1. Severely calcified aortic and mitral valves with severe aortic stenosis and mild mitral stenosis.  2. Left ventricular ejection fraction, by estimation, is 60 to 65%. The left ventricle has normal function. The left ventricle has no regional wall motion abnormalities. There is moderate concentric left ventricular hypertrophy. Left ventricular diastolic parameters are consistent with Grade II diastolic dysfunction (pseudonormalization). Elevated left atrial pressure.  3. Right ventricular systolic function is normal. The right ventricular size is normal.  4. The mitral valve is normal in structure. Mild mitral valve regurgitation. Mild mitral stenosis. Moderate mitral annular calcification.  5. The aortic valve is calcified. There is severe calcifcation of the aortic valve. There is severe thickening of the aortic valve. Aortic valve regurgitation is not  visualized. Severe aortic valve stenosis. Aortic valve mean gradient measures 67.0 mmHg.  6. The inferior vena cava is normal in size with greater than 50% respiratory variability, suggesting right atrial pressure of 3 mmHg. FINDINGS  Left Ventricle: Left ventricular ejection fraction, by estimation, is 60 to 65%. The left ventricle has normal function. The left ventricle has no regional wall motion abnormalities. Definity contrast agent was given IV to delineate the left ventricular  endocardial borders. The left ventricular internal cavity size was normal in size. There is moderate concentric left ventricular  hypertrophy. Left ventricular diastolic parameters are consistent with Grade II diastolic dysfunction (pseudonormalization).  Elevated left atrial pressure. Right Ventricle: The right ventricular size is normal. No increase in right ventricular wall thickness. Right ventricular systolic function is normal. Left Atrium: Left atrial size was normal in size. Right Atrium: Right atrial size was normal in size. Pericardium: There is no evidence of pericardial effusion. Mitral Valve: The mitral valve is normal in structure. There is severe thickening of the mitral valve leaflet(s). There is severe calcification of the mitral valve leaflet(s). Moderate mitral annular calcification. Mild mitral valve regurgitation. Mild mitral valve stenosis. MV peak gradient, 13.7 mmHg. The mean mitral valve gradient is 4.0 mmHg. Tricuspid Valve: The tricuspid valve is normal in structure. Tricuspid valve regurgitation is not demonstrated. No evidence of tricuspid stenosis. Aortic Valve: The aortic valve is calcified. There is severe calcifcation of the aortic valve. There is severe thickening of the aortic valve. Aortic valve regurgitation is not visualized. Severe aortic stenosis is present. Aortic valve mean gradient measures 67.0 mmHg. Aortic valve peak gradient measures 108.4 mmHg. Aortic valve area, by VTI measures 4.16 cm.  Pulmonic Valve: The pulmonic valve was normal in structure. Pulmonic valve regurgitation is not visualized. No evidence of pulmonic stenosis. Aorta: The aortic root is normal in size and structure. Venous: The inferior vena cava is normal in size with greater than 50% respiratory variability, suggesting right atrial pressure of 3 mmHg. IAS/Shunts: No atrial level shunt detected by color flow Doppler.  LEFT VENTRICLE PLAX 2D LVIDd:         4.90 cm     Diastology LVIDs:         3.70 cm     LV e' medial:    4.43 cm/s LV PW:         1.60 cm     LV E/e' medial:  35.7 LV IVS:        1.20 cm     LV e' lateral:   5.37 cm/s LVOT diam:     2.40 cm     LV E/e' lateral: 29.4 LV SV:         525 LV SV Index:   228 LVOT Area:     4.52 cm  LV Volumes (MOD) LV vol d, MOD A2C: 79.6 ml LV vol d, MOD A4C: 99.0 ml LV vol s, MOD A2C: 18.7 ml LV vol s, MOD A4C: 42.3 ml LV SV MOD A2C:     60.9 ml LV SV MOD A4C:     99.0 ml LV SV MOD BP:      61.7 ml RIGHT VENTRICLE            IVC RV S prime:     9.65 cm/s  IVC diam: 2.50 cm TAPSE (M-mode): 2.4 cm LEFT ATRIUM             Index       RIGHT ATRIUM           Index LA diam:        3.90 cm 1.69 cm/m  RA Area:     15.90 cm LA Vol (A2C):   57.4 ml 24.94 ml/m RA Volume:   39.40 ml  17.12 ml/m LA Vol (A4C):   27.0 ml 11.73 ml/m LA Biplane Vol: 40.0 ml 17.38 ml/m  AORTIC VALVE AV Area (Vmax):    4.52 cm AV Area (Vmean):   4.45 cm AV Area (VTI):     4.16 cm AV Vmax:  520.60 cm/s AV Vmean:          371.000 cm/s AV VTI:            1.262 m AV Peak Grad:      108.4 mmHg AV Mean Grad:      67.0 mmHg LVOT Vmax:         520.00 cm/s LVOT Vmean:        365.000 cm/s LVOT VTI:          1.160 m LVOT/AV VTI ratio: 0.92  AORTA Ao Root diam: 3.60 cm Ao Asc diam:  3.30 cm MITRAL VALVE MV Area (PHT): 2.37 cm     SHUNTS MV Peak grad:  13.7 mmHg    Systemic VTI:  1.16 m MV Mean grad:  4.0 mmHg     Systemic Diam: 2.40 cm MV Vmax:       1.85 m/s MV Vmean:      85.8 cm/s MV Decel Time: 320 msec MV E  velocity: 158.00 cm/s MV A velocity: 94.90 cm/s MV E/A ratio:  1.66 Ena Dawley MD Electronically signed by Ena Dawley MD Signature Date/Time: 06/04/2020/4:31:23 PM    Final      STS Risk Calculator: Procedure: Isolated AVR Risk of Mortality: 2.319% Renal Failure: 13.059% Permanent Stroke: 1.244% Prolonged Ventilation: 8.441% DSW Infection: 0.532% Reoperation: 4.473% Morbidity or Mortality: 23.012% Short Length of Stay: 34.907% Long Length of Stay: 9.339%  _____________________   [facility] Cardiomyopathy Questionnaire  KCCQ-12 06/05/2020  1 a. Ability to shower/bathe Not at all limited  1 b. Ability to walk 1 block Slightly limited  1 c. Ability to hurry/jog Extremely limited  2. Edema feet/ankles/legs 1-2 times a week  3. Limited by fatigue At least once a day  4. Limited by dyspnea At least once a day  5. Sitting up / on 3+ pillows Never over the past 2 weeks  6. Limited enjoyment of life Limited quite a bit  7. Rest of life w/ symptoms Mostly dissatisfied  8 a. Participation in hobbies Moderately limited  8 b. Participation in chores Slightly limited  8 c. Visiting family/friends N/A, did not do for other reasons      Assessment and Plan:   Steven [name] is a 64 y.o. male with symptoms of severe, stage D aortic stenosis with NYHA Class III symptoms, currently admitted for left leg osteomyelitis c/b acute CHF. I have reviewed the patient's recent echocardiogram which is notable for vigorous LV systolic function and severe aortic stenosis with peak gradient of 108.4 mm hg and mean transvalvular gradient of 67 mmhg.   I have reviewed the natural history of aortic stenosis with the patient. We have discussed the limitations of medical therapy and the poor prognosis associated with symptomatic aortic stenosis. We have reviewed potential treatment options, including palliative medical therapy, conventional surgical aortic valve replacement, and  transcatheter aortic valve replacement. We discussed treatment options in the context of this patient's specific comorbid medical conditions.    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 Other significant comorbid conditions include osteomyelitis requiring amputation. TAVR seems like a reasonable treatment option for this patient pending formal cardiac surgical consultation. We discussed typical evaluation which will require a L/RHC, gated cardiac CTA and a CTA of the chest/abdomen/pelvis to evaluate both his cardiac anatomy and peripheral vasculature. Follow-up testing will be arranged  as an inpatient and outpatient. Since he will be here until at least Monday, we will plan to get diagnostic L/RHC done before discharged to CIR. Will gently hydrate him Sunday night given CKD stage IV for cath on Monday morning.   I have placed orders for cath on Monday AM at 10:30am with Dr. Saunders Revel. After L/RHC, he can be discharged to CIR. We will let him recover from recent leg amputation, arrange dental extractions and do the rest of the TAVR work up as an outpatient.    For questions or updates, please contact [place] Please consult [email or web address] for contact info under    Signed, Angelena Form, PA-C  06/05/2020 2:01 PM  Patient seen, examined. Available data reviewed. Agree with findings, assessment, and plan as outlined by Nell Range, PA-C.  The patient is independently interviewed and examined.  He is alert, oriented, in no distress.  HEENT is normal except for poor dentition, JVP is normal, carotid upstrokes are normal with bilateral bruits, lung fields are clear to auscultation bilaterally, heart is regular rate and rhythm with a grade 3/6 harsh late peaking systolic murmur at the right upper sternal border, diminished A2.   There is no diastolic murmur.  Abdomen is soft and nontender.  Left BKA site noted, trace right pretibial edema noted.  Upper extremity strength is normal.  Echo images were personally reviewed and demonstrate normal LV systolic function with no regional wall motion abnormalities.  There is moderate LVH present.  The aortic valve is severely calcified and restricted with very poor leaflet mobility.  The transvalvular gradients are severely elevated with a mean pressure gradient of 67 mmHg.  The patient's exam, progressive symptoms of exertional dyspnea and progressive fatigue, and echo findings are diagnostic of critical aortic stenosis.  I have reviewed the natural history of severe aortic stenosis with the patient and his mother who is at the bedside.  We discussed treatment options in the context of the patient's comorbid medical conditions.  His most important comorbidities include renal transplantation on chronic immunosuppressive therapy, stage IV chronic kidney disease, and diabetes complicated by recent diabetic foot ulcer and osteomyelitis requiring amputation.  The patient clearly requires aortic valve replacement and the question is whether TAVR or surgical AVR will be his preferred treatment option.  I think his risk of surgical aortic valve replacement is higher than that predicted in the STS risk calculator.  However, the patient understands he needs to undergo further studies to evaluate whether he would be a candidate for TAVR.  I have recommended a right and left heart catheterization to evaluate coronary anatomy, the presence of coronary obstruction, and evaluate intracardiac hemodynamics. I have reviewed the risks, indications, and alternatives to cardiac catheterization, possible angioplasty, and stenting with the patient. Risks include but are not limited to bleeding, infection, vascular injury, stroke, myocardial infection, arrhythmia, kidney injury, radiation-related injury in the case of  prolonged fluoroscopy use, emergency cardiac surgery, and death. The patient understands the risks of serious complication is 1-2 in 2355 with diagnostic cardiac cath and 1-2% or less with angioplasty/stenting.  Once his cardiac catheterization is completed, the patient can continue to undergo rehabilitation for his left below-knee amputation.  He would be eligible to transfer to [facility] inpatient rehab if he is accepted there.  We will arrange further studies with staged CT angiogram studies of the heart as well as the chest, abdomen, and pelvis, pending stability of the patient's renal function.  Ultimately, the patient will undergo formal cardiac  surgical evaluation once all of his studies are completed, as part of a multidisciplinary heart team review of his case.  Sherren Mocha, M.D. 06/05/2020 6:34 PM

## 2020-06-05 NOTE — Progress Notes (Signed)
Inpatient Diabetes Program Recommendations  AACE/ADA: New Consensus Statement on Inpatient Glycemic Control (2015)  Target Ranges:  Prepandial:   less than 140 mg/dL      Peak postprandial:   less than 180 mg/dL (1-2 hours)      Critically ill patients:  140 - 180 mg/dL   Lab Results  Component Value Date   GLUCAP 210 (H) 06/05/2020   HGBA1C 9.3 (H) 06/01/2020    Review of Glycemic Control  Inpatient Diabetes Program Recommendations:   Spoke with pt about  A1C results 9.3 (average blood glucose 220 over the past 2-3 months time) and explained what an A1C is, basic pathophysiology of DM Type 2, basic home care, basic diabetes diet nutrition principles, importance of checking CBGs and maintaining good CBG control to prevent long-term and short-term complications. Reviewed signs and symptoms of hyperglycemia and hypoglycemia and how to treat hypoglycemia at home. Also reviewed blood sugar goals at home.  RNs to provide ongoing basic DM education at bedside with this patient. Reviewed basic plate method with patient. Patient states he normally drinks water and V8 splash drink. Reviewed with patient this drink has 18 gms carbohydrate per 8 ounces so would be best to avoid any drinks with sugars. Reviewed with need for controlling CBGs post op to decrease chance of infection. Answered nutrition questions for patient and patient verbalized understanding of hypoglycemia protocol.  Thank you, Nani Gasser. Steven Witzke, RN, MSN, CDE  Diabetes Coordinator Inpatient Glycemic Control Team Team Pager [phone] (8am-5pm) 06/05/2020 2:39 PM'

## 2020-06-06 ENCOUNTER — Inpatient Hospital Stay (HOSPITAL_COMMUNITY): Payer: BC Managed Care – PPO

## 2020-06-06 DIAGNOSIS — I350 Nonrheumatic aortic (valve) stenosis: Secondary | ICD-10-CM

## 2020-06-06 DIAGNOSIS — Z01810 Encounter for preprocedural cardiovascular examination: Secondary | ICD-10-CM | POA: Diagnosis not present

## 2020-06-06 LAB — CULTURE, BLOOD (ROUTINE X 2)
Culture: NO GROWTH
Culture: NO GROWTH

## 2020-06-06 LAB — BASIC METABOLIC PANEL
Anion gap: 8 (ref 5–15)
BUN: 43 mg/dL — ABNORMAL HIGH (ref 8–23)
CO2: 20 mmol/L — ABNORMAL LOW (ref 22–32)
Calcium: 8.7 mg/dL — ABNORMAL LOW (ref 8.9–10.3)
Chloride: 108 mmol/L (ref 98–111)
Creatinine, Ser: 1.98 mg/dL — ABNORMAL HIGH (ref 0.61–1.24)
GFR, Estimated: 37 mL/min — ABNORMAL LOW (ref >60)
Glucose, Bld: 232 mg/dL — ABNORMAL HIGH (ref 70–99)
Potassium: 5 mmol/L (ref 3.5–5.1)
Sodium: 136 mmol/L (ref 135–145)

## 2020-06-06 LAB — CBC
HCT: 29.1 % — ABNORMAL LOW (ref 39.0–52.0)
Hemoglobin: 9.1 g/dL — ABNORMAL LOW (ref 13.0–17.0)
MCH: 25 pg — ABNORMAL LOW (ref 26.0–34.0)
MCHC: 31.3 g/dL (ref 30.0–36.0)
MCV: 79.9 fL — ABNORMAL LOW (ref 80.0–100.0)
Platelets: 150 10*3/uL (ref 150–400)
RBC: 3.64 MIL/uL — ABNORMAL LOW (ref 4.22–5.81)
RDW: 13.9 % (ref 11.5–15.5)
WBC: 4.4 10*3/uL (ref 4.0–10.5)
nRBC: 0 % (ref 0.0–0.2)

## 2020-06-06 LAB — GLUCOSE, CAPILLARY
Glucose-Capillary: 201 mg/dL — ABNORMAL HIGH (ref 70–99)
Glucose-Capillary: 254 mg/dL — ABNORMAL HIGH (ref 70–99)
Glucose-Capillary: 177 mg/dL — ABNORMAL HIGH (ref 70–99)
Glucose-Capillary: 134 mg/dL — ABNORMAL HIGH (ref 70–99)

## 2020-06-06 MED ORDER — SODIUM ZIRCONIUM CYCLOSILICATE 10 G PO PACK
10.0000 g | PACK | Freq: Once | ORAL | Status: AC
  Administered 2020-06-06: 10 g via ORAL
  Filled 2020-06-06: qty 1

## 2020-06-06 NOTE — Progress Notes (Signed)
PROGRESS NOTE    Steven [name]  XIP:[id number] DOB: 1956/06/12 DOA: 06/01/2020 PCP: Isaac Bliss, Rayford Halsted, MD    Brief Narrative:  64 year old male with a past medical history of obesity, OSA, hypertension, hyperlipidemia, type 2 diabetes ESRD s/p renal transplant in 2006 on immunosuppressants presented to [facility] ED due to a worsening diabetic pressure ulcer on the bottom of his left foot of 1 to 2 weeks duration. He initially took Keflex without improvement and was switched to Doxy.  No improvement.  Presented to the ED for further evaluation and management of his wound.  Work up revealed left foot osteomyelitis by MRI.  Subjective: -Denies any further dyspnea, no chest pain, reports pain is much better controlled currently.  Assessment & Plan:   Principal Problem:   Osteomyelitis of foot ([place]) Active Problems:   Diabetes mellitus ([place])   Hyperlipidemia   Kidney transplant recipient   Open wound of left foot   Diabetic foot infection ([place])   CKD (chronic kidney disease), stage III (HCC)   Nonrheumatic aortic valve stenosis  Osteomyelitis of the left foot with cellulitis, failed outpatient antibiotics -Patient initially on broad-spectrum IV antibiotics, IV vancomycin and cefepime, vancomycin later changed to IV linezolid given AKI . -Currently off antibiotics. - MRI left foot is consistent with osteomyelitis. -Orthopedic input greatly appreciated, status post tibial amputation by Dr. Sharol Given on 11/3.  . -PT/OT consulted.   -CIR consulted.  Non oliguric AKI on CKD 3B status post renal transplant on immunosuppressants likely prerenal in the setting of dehydration -Continue with immunosuppressive therapy. -Creatinine at baseline, continue to monitor closely as we will need IV diuresis. - Nephrology has signed off -Creatinine has improved today to 1.98. -Potassium is 5 today, will give Lokelma.  Acute diastolic CHF/severe aortic stenosis -On Lasix as needed, has mild  lower extremity edema -With severe aortic stenosis, new finding, cardiology has been consulted.  He is currently being planned for TAVR work-up, discussed with cardiology, plan for right/left cardiac cath on Monday.  Nonhealing bilateral lower extremity diabetic foot wound Continue local wound care Orthopedic Surgery following, plan for surgery today  Type 2 diabetes on insulin a1c of 9.3 -CBG improved on current regimen of Levemir 15 units daily, 10 nightly, 6 units before meals and sliding scale.   Hyperlipidemia/GERD Continue home regimen  Physical debility PT/OT consulted.   DVT prophylaxis: Heparin subq Code Status: Full Family Communication: none at bedside  Status is: Inpatient  Remains inpatient appropriate because:Ongoing diagnostic testing needed not appropriate for outpatient work up and Inpatient level of care appropriate due to severity of illness   Dispo:  Patient From: Home  Planned Disposition: CIR  Expected discharge date: 06/07/2020  Medically stable for discharge: No        Consultants:   Orthopedic Surgery  Procedures:  Transtibial amputation Application of Prevena wound VAC by Dr. Sharol Given on 11/3.  Antimicrobials: Anti-infectives (From admission, onward)   Start     Dose/Rate Route Frequency Ordered Stop   06/03/20 0800  ceFAZolin (ANCEF) IVPB 2g/100 mL premix        2 g 200 mL/hr over 30 Minutes Intravenous On call to O.R. 06/03/20 0701 06/03/20 1644   06/03/20 0600  vancomycin (VANCOREADY) IVPB 1500 mg/300 mL  Status:  Discontinued        1,500 mg 150 mL/hr over 120 Minutes Intravenous Every 36 hours 06/01/20 1826 06/02/20 1130   06/03/20 0000  ceFEPIme (MAXIPIME) 2 g in sodium chloride 0.9 % 100 mL IVPB  2 g 200 mL/hr over 30 Minutes Intravenous Every 12 hours 06/02/20 1408 06/04/20 1330   06/02/20 1500  ceFEPIme (MAXIPIME) 2 g in sodium chloride 0.9 % 100 mL IVPB        2 g 200 mL/hr over 30 Minutes Intravenous  Once 06/02/20  1408 06/02/20 1653   06/02/20 1400  linezolid (ZYVOX) IVPB 600 mg        600 mg 300 mL/hr over 60 Minutes Intravenous Every 12 hours 06/02/20 1122 06/04/20 2226   06/01/20 1845  vancomycin (VANCOCIN) IVPB 1000 mg/200 mL premix  Status:  Discontinued        1,000 mg 200 mL/hr over 60 Minutes Intravenous  Once 06/01/20 1759 06/01/20 1815   06/01/20 1845  vancomycin (VANCOREADY) IVPB 2000 mg/400 mL        2,000 mg 200 mL/hr over 120 Minutes Intravenous  Once 06/01/20 1816 06/01/20 2042   06/01/20 1045  piperacillin-tazobactam (ZOSYN) IVPB 3.375 g        3.375 g 100 mL/hr over 30 Minutes Intravenous  Once 06/01/20 1043 06/01/20 1237        Objective: Vitals:   06/05/20 1515 06/05/20 1900 06/06/20 0300 06/06/20 0749  BP: 133/65 138/62 130/68 (!) 144/70  Pulse: 82 80 75 85  Resp: 20 18 19 18   Temp: 98.7 F (37.1 C) 98.7 F (37.1 C) 98.7 F (37.1 C) 99.3 F (37.4 C)  TempSrc: Oral Oral Oral Oral  SpO2: 99% 100% 100% 98%  Weight:      Height:        Intake/Output Summary (Last 24 hours) at 06/06/2020 1353 Last data filed at 06/06/2020 0751 Gross per 24 hour  Intake 240 ml  Output 2150 ml  Net -1910 ml   Filed Weights   06/01/20 1420 06/03/20 1452  Weight: 111.5 kg 111.5 kg    Examination:  Awake Alert, Oriented X 3, No new F.N deficits, Normal affect Symmetrical Chest wall movement, Good air movement bilaterally, CTAB RRR,No Gallops,Rubs +Murmurs, No Parasternal Heave +ve B.Sounds, Abd Soft, No tenderness, No rebound - guarding or rigidity. No Cyanosis, right lower extremity with trace edema, left status post BKA, on wound VAC.  Data Reviewed: I have personally reviewed following labs and imaging studies  CBC: Recent Labs  Lab 06/01/20 1147 06/01/20 1147 06/02/20 0309 06/03/20 0321 06/04/20 0832 06/05/20 0253 06/06/20 0510  WBC 6.4   < > 5.2 4.7 5.9 5.3 4.4  NEUTROABS 5.3  --   --  3.5  --   --   --   HGB 10.1*   < > 9.1* 8.8* 9.8* 9.2* 9.1*  HCT 31.8*   <  > 28.7* 28.1* 31.1* 28.5* 29.1*  MCV 82.6   < > 82.2 82.2 81.2 80.5 79.9*  PLT 146*   < > 154 150 166 162 150   < > = values in this interval not displayed.   Basic Metabolic Panel: Recent Labs  Lab 06/02/20 0309 06/02/20 0309 06/03/20 0321 06/04/20 0832 06/04/20 1349 06/05/20 0253 06/06/20 0510  NA 135  --  132* 130*  --  132* 136  K 4.5  --  4.2 5.3*  --  4.4 5.0  CL 102  --  102 99  --  102 108  CO2 22  --  20* 19*  --  19* 20*  GLUCOSE 290*   < > 354* 444* 433* 160* 232*  BUN 40*  --  41* 36*  --  47* 43*  CREATININE 2.23*  --  2.22* 2.41*  --  2.49* 1.98*  CALCIUM 8.5*  --  8.3* 8.8*  --  8.8* 8.7*  MG  --   --  1.7  --   --   --   --    < > = values in this interval not displayed.   GFR: Estimated Creatinine Clearance: 49.1 mL/min (A) (by C-G formula based on SCr of 1.98 mg/dL (H)). Liver Function Tests: Recent Labs  Lab 06/01/20 1147 06/03/20 0321  AST 18 15  ALT 27 26  ALKPHOS 116 113  BILITOT 1.2 1.0  PROT 6.9 6.2*  ALBUMIN 3.3* 3.0*   No results for input(s): LIPASE, AMYLASE in the last 168 hours. No results for input(s): AMMONIA in the last 168 hours. Coagulation Profile: No results for input(s): INR, PROTIME in the last 168 hours. Cardiac Enzymes: No results for input(s): CKTOTAL, CKMB, CKMBINDEX, TROPONINI in the last 168 hours. BNP (last 3 results) No results for input(s): PROBNP in the last 8760 hours. HbA1C: No results for input(s): HGBA1C in the last 72 hours. CBG: Recent Labs  Lab 06/05/20 1216 06/05/20 1748 06/05/20 1949 06/06/20 0747 06/06/20 1208  GLUCAP 210* 171* 143* 201* 254*   Lipid Profile: No results for input(s): CHOL, HDL, LDLCALC, TRIG, CHOLHDL, LDLDIRECT in the last 72 hours. Thyroid Function Tests: No results for input(s): TSH, T4TOTAL, FREET4, T3FREE, THYROIDAB in the last 72 hours. Anemia Panel: No results for input(s): VITAMINB12, FOLATE, FERRITIN, TIBC, IRON, RETICCTPCT in the last 72 hours. Sepsis Labs: Recent  Labs  Lab 06/01/20 1147  LATICACIDVEN 1.1    Recent Results (from the past 240 hour(s))  Respiratory Panel by RT PCR (Flu A&B, Covid) - Nasopharyngeal Swab     Status: None   Collection Time: 06/01/20 11:02 AM   Specimen: Nasopharyngeal Swab  Result Value Ref Range Status   SARS Coronavirus 2 by RT PCR NEGATIVE NEGATIVE Final    Comment: (NOTE) SARS-CoV-2 target nucleic acids are NOT DETECTED.  The SARS-CoV-2 RNA is generally detectable in upper respiratoy specimens during the acute phase of infection. The lowest concentration of SARS-CoV-2 viral copies this assay can detect is 131 copies/mL. A negative result does not preclude SARS-Cov-2 infection and should not be used as the sole basis for treatment or other patient management decisions. A negative result may occur with  improper specimen collection/handling, submission of specimen other than nasopharyngeal swab, presence of viral mutation(s) within the areas targeted by this assay, and inadequate number of viral copies (<131 copies/mL). A negative result must be combined with clinical observations, patient history, and epidemiological information. The expected result is Negative.  Fact Sheet for Patients:  PinkCheek.be  Fact Sheet for Healthcare Providers:  GravelBags.it  This test is no t yet approved or cleared by the [place] FDA and  has been authorized for detection and/or diagnosis of SARS-CoV-2 by FDA under an Emergency Use Authorization (EUA). This EUA will remain  in effect (meaning this test can be used) for the duration of the COVID-19 declaration under Section 564(b)(1) of the Act, 21 U.S.C. section 360bbb-3(b)(1), unless the authorization is terminated or revoked sooner.     Influenza A by PCR NEGATIVE NEGATIVE Final   Influenza B by PCR NEGATIVE NEGATIVE Final    Comment: (NOTE) The Xpert Xpress SARS-CoV-2/FLU/RSV assay is intended as an aid  in  the diagnosis of influenza from Nasopharyngeal swab specimens and  should not be used as a sole basis for treatment. Nasal washings and  aspirates are unacceptable  for Xpert Xpress SARS-CoV-2/FLU/RSV  testing.  Fact Sheet for Patients: PinkCheek.be  Fact Sheet for Healthcare Providers: GravelBags.it  This test is not yet approved or cleared by the [place] FDA and  has been authorized for detection and/or diagnosis of SARS-CoV-2 by  FDA under an Emergency Use Authorization (EUA). This EUA will remain  in effect (meaning this test can be used) for the duration of the  Covid-19 declaration under Section 564(b)(1) of the Act, 21  U.S.C. section 360bbb-3(b)(1), unless the authorization is  terminated or revoked. Performed at [facility], [place] [address] [place], [place] [address]   Culture, blood (routine x 2)     Status: None   Collection Time: 06/01/20 11:47 AM   Specimen: BLOOD  Result Value Ref Range Status   Specimen Description   Final    BLOOD RIGHT ANTECUBITAL Performed at [place] [address] [place], [place] [address]    Special Requests   Final    BOTTLES DRAWN AEROBIC AND ANAEROBIC Blood Culture results may not be optimal due to an excessive volume of blood received in culture bottles Performed at [place] [address] [place], [place] [address]    Culture   Final    NO GROWTH 5 DAYS Performed at [facility], [place] [address] [place], [place] [address]    Report Status 06/06/2020 FINAL  Final  Culture, blood (routine x 2)     Status: None   Collection Time: 06/01/20 11:47 AM   Specimen: BLOOD  Result Value Ref Range Status   Specimen Description   Final    BLOOD LEFT ANTECUBITAL Performed at [place] [address] [place], [place] [address]    Special Requests   Final    BOTTLES DRAWN  AEROBIC AND ANAEROBIC Blood Culture results may not be optimal due to an excessive volume of blood received in culture bottles Performed at [place] [address] [place], [place] [address]    Culture   Final    NO GROWTH 5 DAYS Performed at [facility], [place] [address] [place], [place] [address]    Report Status 06/06/2020 FINAL  Final  MRSA PCR Screening     Status: None   Collection Time: 06/02/20  7:00 AM   Specimen: Nasopharyngeal  Result Value Ref Range Status   MRSA by PCR NEGATIVE NEGATIVE Final    Comment:        The GeneXpert MRSA Assay (FDA approved for NASAL specimens only), is one component of a comprehensive MRSA colonization surveillance program. It is not intended to diagnose MRSA infection nor to guide or monitor treatment for MRSA infections. Performed at [facility], [place] [address], [place],  [address]      Radiology Studies: ECHOCARDIOGRAM COMPLETE  Result Date: 06/04/2020    ECHOCARDIOGRAM REPORT   Patient Name:   Steven [name] [facility] Date of Exam: 06/04/2020 Medical Rec #:  [id number]           Height:       71.0 in Accession #:    [id number]          Weight:       245.8 lb Date of Birth:  Mar 30, 1956           BSA:          2.302 m Patient Age:    89 years  BP:           115/67 mmHg Patient Gender: M                   HR:           67 bpm. Exam Location:  Inpatient Procedure: 2D Echo, 3D Echo, Cardiac Doppler, Color Doppler and Intracardiac            Opacification Agent REPORT CONTAINS CRITICAL RESULT Indications:    R01.1 Murmur  History:        Patient has no prior history of Echocardiogram examinations.                 Signs/Symptoms:Shortness of Breath and Dyspnea; Risk                 Factors:Sleep Apnea, Hypertension and Diabetes. Leg amputation                 on previous day. Kidney transplant.  Sonographer:    Roseanna Rainbow RDCS Referring Phys: [id number] Earnestene Angello S [facility]  Sonographer Comments:  Technically difficult study due to poor echo windows and patient is morbidly obese. Image acquisition challenging due to patient body habitus. IMPRESSIONS  1. Severely calcified aortic and mitral valves with severe aortic stenosis and mild mitral stenosis.  2. Left ventricular ejection fraction, by estimation, is 60 to 65%. The left ventricle has normal function. The left ventricle has no regional wall motion abnormalities. There is moderate concentric left ventricular hypertrophy. Left ventricular diastolic parameters are consistent with Grade II diastolic dysfunction (pseudonormalization). Elevated left atrial pressure.  3. Right ventricular systolic function is normal. The right ventricular size is normal.  4. The mitral valve is normal in structure. Mild mitral valve regurgitation. Mild mitral stenosis. Moderate mitral annular calcification.  5. The aortic valve is calcified. There is severe calcifcation of the aortic valve. There is severe thickening of the aortic valve. Aortic valve regurgitation is not visualized. Severe aortic valve stenosis. Aortic valve mean gradient measures 67.0 mmHg.  6. The inferior vena cava is normal in size with greater than 50% respiratory variability, suggesting right atrial pressure of 3 mmHg. FINDINGS  Left Ventricle: Left ventricular ejection fraction, by estimation, is 60 to 65%. The left ventricle has normal function. The left ventricle has no regional wall motion abnormalities. Definity contrast agent was given IV to delineate the left ventricular  endocardial borders. The left ventricular internal cavity size was normal in size. There is moderate concentric left ventricular hypertrophy. Left ventricular diastolic parameters are consistent with Grade II diastolic dysfunction (pseudonormalization).  Elevated left atrial pressure. Right Ventricle: The right ventricular size is normal. No increase in right ventricular wall thickness. Right ventricular systolic function is  normal. Left Atrium: Left atrial size was normal in size. Right Atrium: Right atrial size was normal in size. Pericardium: There is no evidence of pericardial effusion. Mitral Valve: The mitral valve is normal in structure. There is severe thickening of the mitral valve leaflet(s). There is severe calcification of the mitral valve leaflet(s). Moderate mitral annular calcification. Mild mitral valve regurgitation. Mild mitral valve stenosis. MV peak gradient, 13.7 mmHg. The mean mitral valve gradient is 4.0 mmHg. Tricuspid Valve: The tricuspid valve is normal in structure. Tricuspid valve regurgitation is not demonstrated. No evidence of tricuspid stenosis. Aortic Valve: The aortic valve is calcified. There is severe calcifcation of the aortic valve. There is severe thickening of the aortic valve. Aortic valve regurgitation is not visualized. Severe aortic stenosis  is present. Aortic valve mean gradient measures 67.0 mmHg. Aortic valve peak gradient measures 108.4 mmHg. Aortic valve area, by VTI measures 4.16 cm. Pulmonic Valve: The pulmonic valve was normal in structure. Pulmonic valve regurgitation is not visualized. No evidence of pulmonic stenosis. Aorta: The aortic root is normal in size and structure. Venous: The inferior vena cava is normal in size with greater than 50% respiratory variability, suggesting right atrial pressure of 3 mmHg. IAS/Shunts: No atrial level shunt detected by color flow Doppler.  LEFT VENTRICLE PLAX 2D LVIDd:         4.90 cm     Diastology LVIDs:         3.70 cm     LV e' medial:    4.43 cm/s LV PW:         1.60 cm     LV E/e' medial:  35.7 LV IVS:        1.20 cm     LV e' lateral:   5.37 cm/s LVOT diam:     2.40 cm     LV E/e' lateral: 29.4 LV SV:         525 LV SV Index:   228 LVOT Area:     4.52 cm  LV Volumes (MOD) LV vol d, MOD A2C: 79.6 ml LV vol d, MOD A4C: 99.0 ml LV vol s, MOD A2C: 18.7 ml LV vol s, MOD A4C: 42.3 ml LV SV MOD A2C:     60.9 ml LV SV MOD A4C:     99.0 ml LV SV  MOD BP:      61.7 ml RIGHT VENTRICLE            IVC RV S prime:     9.65 cm/s  IVC diam: 2.50 cm TAPSE (M-mode): 2.4 cm LEFT ATRIUM             Index       RIGHT ATRIUM           Index LA diam:        3.90 cm 1.69 cm/m  RA Area:     15.90 cm LA Vol (A2C):   57.4 ml 24.94 ml/m RA Volume:   39.40 ml  17.12 ml/m LA Vol (A4C):   27.0 ml 11.73 ml/m LA Biplane Vol: 40.0 ml 17.38 ml/m  AORTIC VALVE AV Area (Vmax):    4.52 cm AV Area (Vmean):   4.45 cm AV Area (VTI):     4.16 cm AV Vmax:           520.60 cm/s AV Vmean:          371.000 cm/s AV VTI:            1.262 m AV Peak Grad:      108.4 mmHg AV Mean Grad:      67.0 mmHg LVOT Vmax:         520.00 cm/s LVOT Vmean:        365.000 cm/s LVOT VTI:          1.160 m LVOT/AV VTI ratio: 0.92  AORTA Ao Root diam: 3.60 cm Ao Asc diam:  3.30 cm MITRAL VALVE MV Area (PHT): 2.37 cm     SHUNTS MV Peak grad:  13.7 mmHg    Systemic VTI:  1.16 m MV Mean grad:  4.0 mmHg     Systemic Diam: 2.40 cm MV Vmax:       1.85 m/s MV Vmean:      85.8  cm/s MV Decel Time: 320 msec MV E velocity: 158.00 cm/s MV A velocity: 94.90 cm/s MV E/A ratio:  1.66 Ena Dawley MD Electronically signed by Ena Dawley MD Signature Date/Time: 06/04/2020/4:31:23 PM    Final    VAS US CAROTID  Result Date: 06/06/2020 Carotid Arterial Duplex Study Indications:   Pre operative TAVR. Risk Factors:  Hypertension, hyperlipidemia, Diabetes, PAD. Other Factors: Aortic valve disease. ESRD, s/p kidney transplant. Performing Technologist: Sharion Dove RVS  Examination Guidelines: A complete evaluation includes B-mode imaging, spectral Doppler, color Doppler, and power Doppler as needed of all accessible portions of each vessel. Bilateral testing is considered an integral part of a complete examination. Limited examinations for reoccurring indications may be performed as noted.  Right Carotid Findings: +----------+--------+--------+--------+------------------+------------------+           PSV cm/sEDV  cm/sStenosisPlaque DescriptionComments           +----------+--------+--------+--------+------------------+------------------+ CCA Prox  91      11                                intimal thickening +----------+--------+--------+--------+------------------+------------------+ CCA Distal66      14                                intimal thickening +----------+--------+--------+--------+------------------+------------------+ ICA Prox  90      21      1-39%   heterogenous      Shadowing          +----------+--------+--------+--------+------------------+------------------+ ICA Distal106     30                                                   +----------+--------+--------+--------+------------------+------------------+ ECA       82      16                                                   +----------+--------+--------+--------+------------------+------------------+ +----------+--------+-------+--------+-------------------+           PSV cm/sEDV cmsDescribeArm Pressure (mmHG) +----------+--------+-------+--------+-------------------+ [id number]                                         +----------+--------+-------+--------+-------------------+ +---------+--------+--+--------+--+ VertebralPSV cm/s83EDV cm/s12 +---------+--------+--+--------+--+  Left Carotid Findings: +----------+--------+--------+--------+------------------+------------------+           PSV cm/sEDV cm/sStenosisPlaque DescriptionComments           +----------+--------+--------+--------+------------------+------------------+ CCA Prox  92      23                                intimal thickening +----------+--------+--------+--------+------------------+------------------+ CCA Distal96      20                                intimal thickening +----------+--------+--------+--------+------------------+------------------+ ICA Prox  162     34      1-39%   heterogenous  Shadowing          +----------+--------+--------+--------+------------------+------------------+ ICA Distal91      21                                                   +----------+--------+--------+--------+------------------+------------------+ ECA       177     19                                                   +----------+--------+--------+--------+------------------+------------------+ +----------+--------+--------+--------+-------------------+           PSV cm/sEDV cm/sDescribeArm Pressure (mmHG) +----------+--------+--------+--------+-------------------+ [id number]                                         +----------+--------+--------+--------+-------------------+ +---------+--------+--+--------+--+ VertebralPSV cm/s48EDV cm/s17 +---------+--------+--+--------+--+   Summary: Right Carotid: Velocities in the right ICA are consistent with a 1-39% stenosis. Left Carotid: Velocities in the left ICA are consistent with a 1-39% stenosis. Vertebrals:  Bilateral vertebral arteries demonstrate antegrade flow. Subclavians: Normal flow hemodynamics were seen in bilateral subclavian              arteries. *See table(s) above for measurements and observations.     Preliminary     Scheduled Meds: . (feeding supplement) PROSource Plus  30 mL Oral TID BM  . atorvastatin  10 mg Oral Daily  . docusate sodium  100 mg Oral BID  . fenofibrate  54 mg Oral Daily  . glyBURIDE  5 mg Oral BID WC  . heparin  5,000 Units Subcutaneous Q8H  . insulin aspart  0-15 Units Subcutaneous TID WC  . insulin aspart  6 Units Subcutaneous TID WC  . insulin detemir  10 Units Subcutaneous QHS  . insulin detemir  15 Units Subcutaneous Daily  . mycophenolate  360 mg Oral BID  . pantoprazole  40 mg Oral Daily  . sodium chloride flush  3 mL Intravenous Q12H  . tacrolimus  1 mg Oral BID   Continuous Infusions: . sodium chloride 250 mL (06/01/20 1838)     LOS: 5 days   Phillips Climes, MD [facility] Pager On Amion  If 7PM-7AM, please contact night-coverage 06/06/2020, 1:53 PM

## 2020-06-07 DIAGNOSIS — I350 Nonrheumatic aortic (valve) stenosis: Secondary | ICD-10-CM | POA: Diagnosis not present

## 2020-06-07 LAB — BASIC METABOLIC PANEL
Anion gap: 7 (ref 5–15)
BUN: 37 mg/dL — ABNORMAL HIGH (ref 8–23)
CO2: 22 mmol/L (ref 22–32)
Calcium: 8.3 mg/dL — ABNORMAL LOW (ref 8.9–10.3)
Chloride: 108 mmol/L (ref 98–111)
Creatinine, Ser: 1.69 mg/dL — ABNORMAL HIGH (ref 0.61–1.24)
GFR, Estimated: 45 mL/min — ABNORMAL LOW (ref >60)
Glucose, Bld: 218 mg/dL — ABNORMAL HIGH (ref 70–99)
Potassium: 4.4 mmol/L (ref 3.5–5.1)
Sodium: 137 mmol/L (ref 135–145)

## 2020-06-07 LAB — GLUCOSE, CAPILLARY
Glucose-Capillary: 162 mg/dL — ABNORMAL HIGH (ref 70–99)
Glucose-Capillary: 202 mg/dL — ABNORMAL HIGH (ref 70–99)
Glucose-Capillary: 156 mg/dL — ABNORMAL HIGH (ref 70–99)
Glucose-Capillary: 203 mg/dL — ABNORMAL HIGH (ref 70–99)

## 2020-06-07 MED ORDER — ASPIRIN 81 MG PO CHEW: 81.0000 mg | CHEWABLE_TABLET | ORAL | Status: DC

## 2020-06-07 MED ORDER — SODIUM CHLORIDE 0.9 % WEIGHT BASED INFUSION
1.0000 mL/kg/h | INTRAVENOUS | Status: DC
  Administered 2020-06-07 – 2020-06-08 (×2): 1 mL/kg/h via INTRAVENOUS

## 2020-06-07 NOTE — Progress Notes (Signed)
Dr. Domenic Polite wanted to ensure patient was on cath schedule and orders written. Per Epic this is scheduled for Dr. Saunders Revel tomorrow at 10:30am. Reviewed orders under sign/held. Fluid orders were listed to start 11/5 but do not see released. Per MD request, will instead start 79ml/kg/hr now. Emmalena Canny PA-C

## 2020-06-07 NOTE — Progress Notes (Addendum)
   Progress Note  Patient Name: Ruslan Mccabe Date of Encounter: 06/07/2020  Primary Cardiologist: Sherren Mocha, MD  Please see full cardiology consultation by Dr. Burt Knack from November 5.  Patient is scheduled for right and left heart catheterization tomorrow with Dr. Saunders Revel for further evaluation of severe stage D aortic stenosis with NYHA class III symptoms.  Orders are written.  Vital signs are stable, systolic in the 575Y to 518Z with heart rate in the 70s in sinus rhythm, afebrile.  Follow-up lab work shows creatinine 1.69 down from 1.98 and potassium 4.4.  Further recommendations regarding additional testing and evaluation for potential TAVR pending results of cardiac catheterization.  Signed, Rozann Lesches, MD  06/07/2020, 9:09 AM

## 2020-06-07 NOTE — Progress Notes (Signed)
PROGRESS NOTE    Steven [name]  IEP:[id number] DOB: [birth year] DOA: 06/01/2020 PCP: Isaac Bliss, Rayford Halsted, MD    Brief Narrative:  64 year old male with a past medical history of obesity, OSA, hypertension, hyperlipidemia, type 2 diabetes ESRD s/p renal transplant in 2006 on immunosuppressants presented to [facility] ED due to a worsening diabetic pressure ulcer on the bottom of his left foot of 1 to 2 weeks duration. He initially took Keflex without improvement and was switched to Doxy.  No improvement.  Presented to the ED for further evaluation and management of his wound.  Work up revealed left foot osteomyelitis by MRI.  Subjective: -Denies any dyspnea, chest pain, reports pain is controlled.    Assessment & Plan:   Principal Problem:   Osteomyelitis of foot ([place]) Active Problems:   Diabetes mellitus ([place])   Hyperlipidemia   Kidney transplant recipient   Open wound of left foot   Diabetic foot infection ([place])   CKD (chronic kidney disease), stage III (HCC)   Nonrheumatic aortic valve stenosis  Osteomyelitis of the left foot with cellulitis, failed outpatient antibiotics -Patient initially on broad-spectrum IV antibiotics, IV vancomycin and cefepime, vancomycin later changed to IV linezolid given AKI . -Currently off antibiotics. - MRI left foot is consistent with osteomyelitis. -Orthopedic input greatly appreciated, status post tibial amputation by Dr. Sharol Given on 11/3.  . -PT/OT consulted.   -CIR consulted.  Awaiting insurance authorization.  Non oliguric AKI on CKD 3B status post renal transplant on immunosuppressants likely prerenal in the setting of dehydration -Continue with immunosuppressive therapy. -Creatinine at baseline, continue to monitor closely as we will need IV diuresis. - Nephrology has signed off -Creatinine has improved today to 1.6  Acute diastolic CHF/severe aortic stenosis -On Lasix as needed, has mild lower extremity edema -With severe aortic  stenosis, new finding, cardiology has been consulted.  He is currently being planned for TAVR work-up, discussed with cardiology, plan for right/left cardiac cath on Monday. -Volume status appears to be improving despite not receiving any Lasix, as well his kidney function has been improving as well.  Nonhealing bilateral lower extremity diabetic foot wound Continue local wound care Orthopedic Surgery following, plan for surgery today  Type 2 diabetes on insulin a1c of 9.3 -CBG improved on current regimen of Levemir 15 units daily, 10 nightly, 6 units before meals and sliding scale.   Hyperlipidemia/GERD Continue home regimen  Physical debility PT/OT consulted.   DVT prophylaxis: Heparin subq Code Status: Full Family Communication: none at bedside  Status is: Inpatient  Remains inpatient appropriate because:Ongoing diagnostic testing needed not appropriate for outpatient work up and Inpatient level of care appropriate due to severity of illness   Dispo:  Patient From: Home  Planned Disposition: CIR  Expected discharge date: 06/08/2020  Medically stable for discharge: No        Consultants:   Orthopedic Surgery  cardiology  Procedures:  Transtibial amputation Application of Prevena wound VAC by Dr. Sharol Given on 11/3.  Antimicrobials: Anti-infectives (From admission, onward)   Start     Dose/Rate Route Frequency Ordered Stop   06/03/20 0800  ceFAZolin (ANCEF) IVPB 2g/100 mL premix        2 g 200 mL/hr over 30 Minutes Intravenous On call to O.R. 06/03/20 0701 06/03/20 1644   06/03/20 0600  vancomycin (VANCOREADY) IVPB 1500 mg/300 mL  Status:  Discontinued        1,500 mg 150 mL/hr over 120 Minutes Intravenous Every 36 hours 06/01/20 1826 06/02/20 1130  06/03/20 0000  ceFEPIme (MAXIPIME) 2 g in sodium chloride 0.9 % 100 mL IVPB        2 g 200 mL/hr over 30 Minutes Intravenous Every 12 hours 06/02/20 1408 06/04/20 1330   06/02/20 1500  ceFEPIme (MAXIPIME) 2 g in  sodium chloride 0.9 % 100 mL IVPB        2 g 200 mL/hr over 30 Minutes Intravenous  Once 06/02/20 1408 06/02/20 1653   06/02/20 1400  linezolid (ZYVOX) IVPB 600 mg        600 mg 300 mL/hr over 60 Minutes Intravenous Every 12 hours 06/02/20 1122 06/04/20 2226   06/01/20 1845  vancomycin (VANCOCIN) IVPB 1000 mg/200 mL premix  Status:  Discontinued        1,000 mg 200 mL/hr over 60 Minutes Intravenous  Once 06/01/20 1759 06/01/20 1815   06/01/20 1845  vancomycin (VANCOREADY) IVPB 2000 mg/400 mL        2,000 mg 200 mL/hr over 120 Minutes Intravenous  Once 06/01/20 1816 06/01/20 2042   06/01/20 1045  piperacillin-tazobactam (ZOSYN) IVPB 3.375 g        3.375 g 100 mL/hr over 30 Minutes Intravenous  Once 06/01/20 1043 06/01/20 1237        Objective: Vitals:   06/06/20 1753 06/06/20 2044 06/07/20 0339 06/07/20 0827  BP: 130/60 (!) 123/56 133/64 140/68  Pulse: 83 77 73 75  Resp: 20 16 17  (!) 22  Temp: 99.1 F (37.3 C) 98.7 F (37.1 C) 98.2 F (36.8 C) 98.5 F (36.9 C)  TempSrc: Oral Oral Oral Oral  SpO2: 99% 98% 99% 100%  Weight:      Height:        Intake/Output Summary (Last 24 hours) at 06/07/2020 1347 Last data filed at 06/07/2020 0829 Gross per 24 hour  Intake 240 ml  Output 1450 ml  Net -1210 ml   Filed Weights   06/01/20 1420 06/03/20 1452  Weight: 111.5 kg 111.5 kg    Examination:  Awake Alert, Oriented X 3, No new F.N deficits, Normal affect Symmetrical Chest wall movement, Good air movement bilaterally, CTAB RRR,No Gallops,Rubs ,+Murmurs, No Parasternal Heave +ve B.Sounds, Abd Soft, No tenderness, No rebound - guarding or rigidity. No Cyanosis, right lower extremity with trace edema, left status post BKA, on wound VAC.  Data Reviewed: I have personally reviewed following labs and imaging studies  CBC: Recent Labs  Lab 06/01/20 1147 06/01/20 1147 06/02/20 0309 06/03/20 0321 06/04/20 0832 06/05/20 0253 06/06/20 0510  WBC 6.4   < > 5.2 4.7 5.9 5.3 4.4    NEUTROABS 5.3  --   --  3.5  --   --   --   HGB 10.1*   < > 9.1* 8.8* 9.8* 9.2* 9.1*  HCT 31.8*   < > 28.7* 28.1* 31.1* 28.5* 29.1*  MCV 82.6   < > 82.2 82.2 81.2 80.5 79.9*  PLT 146*   < > 154 150 166 162 150   < > = values in this interval not displayed.   Basic Metabolic Panel: Recent Labs  Lab 06/03/20 0321 06/03/20 0321 06/04/20 0832 06/04/20 1349 06/05/20 0253 06/06/20 0510 06/07/20 0239  NA 132*  --  130*  --  132* 136 137  K 4.2  --  5.3*  --  4.4 5.0 4.4  CL 102  --  99  --  102 108 108  CO2 20*  --  19*  --  19* 20* 22  GLUCOSE 354*   < >  444* 433* 160* 232* 218*  BUN 41*  --  36*  --  47* 43* 37*  CREATININE 2.22*  --  2.41*  --  2.49* 1.98* 1.69*  CALCIUM 8.3*  --  8.8*  --  8.8* 8.7* 8.3*  MG 1.7  --   --   --   --   --   --    < > = values in this interval not displayed.   GFR: Estimated Creatinine Clearance: 57.6 mL/min (A) (by C-G formula based on SCr of 1.69 mg/dL (H)). Liver Function Tests: Recent Labs  Lab 06/01/20 1147 06/03/20 0321  AST 18 15  ALT 27 26  ALKPHOS 116 113  BILITOT 1.2 1.0  PROT 6.9 6.2*  ALBUMIN 3.3* 3.0*   No results for input(s): LIPASE, AMYLASE in the last 168 hours. No results for input(s): AMMONIA in the last 168 hours. Coagulation Profile: No results for input(s): INR, PROTIME in the last 168 hours. Cardiac Enzymes: No results for input(s): CKTOTAL, CKMB, CKMBINDEX, TROPONINI in the last 168 hours. BNP (last 3 results) No results for input(s): PROBNP in the last 8760 hours. HbA1C: No results for input(s): HGBA1C in the last 72 hours. CBG: Recent Labs  Lab 06/06/20 1208 06/06/20 1657 06/06/20 2044 06/07/20 0650 06/07/20 1151  GLUCAP 254* 177* 134* 162* 202*   Lipid Profile: No results for input(s): CHOL, HDL, LDLCALC, TRIG, CHOLHDL, LDLDIRECT in the last 72 hours. Thyroid Function Tests: No results for input(s): TSH, T4TOTAL, FREET4, T3FREE, THYROIDAB in the last 72 hours. Anemia Panel: No results for  input(s): VITAMINB12, FOLATE, FERRITIN, TIBC, IRON, RETICCTPCT in the last 72 hours. Sepsis Labs: Recent Labs  Lab 06/01/20 1147  LATICACIDVEN 1.1    Recent Results (from the past 240 hour(s))  Respiratory Panel by RT PCR (Flu A&B, Covid) - Nasopharyngeal Swab     Status: None   Collection Time: 06/01/20 11:02 AM   Specimen: Nasopharyngeal Swab  Result Value Ref Range Status   SARS Coronavirus 2 by RT PCR NEGATIVE NEGATIVE Final    Comment: (NOTE) SARS-CoV-2 target nucleic acids are NOT DETECTED.  The SARS-CoV-2 RNA is generally detectable in upper respiratoy specimens during the acute phase of infection. The lowest concentration of SARS-CoV-2 viral copies this assay can detect is 131 copies/mL. A negative result does not preclude SARS-Cov-2 infection and should not be used as the sole basis for treatment or other patient management decisions. A negative result may occur with  improper specimen collection/handling, submission of specimen other than nasopharyngeal swab, presence of viral mutation(s) within the areas targeted by this assay, and inadequate number of viral copies (<131 copies/mL). A negative result must be combined with clinical observations, patient history, and epidemiological information. The expected result is Negative.  Fact Sheet for Patients:  PinkCheek.be  Fact Sheet for Healthcare Providers:  GravelBags.it  This test is no t yet approved or cleared by the [place] FDA and  has been authorized for detection and/or diagnosis of SARS-CoV-2 by FDA under an Emergency Use Authorization (EUA). This EUA will remain  in effect (meaning this test can be used) for the duration of the COVID-19 declaration under Section 564(b)(1) of the Act, 21 U.S.C. section 360bbb-3(b)(1), unless the authorization is terminated or revoked sooner.     Influenza A by PCR NEGATIVE NEGATIVE Final   Influenza B by PCR  NEGATIVE NEGATIVE Final    Comment: (NOTE) The Xpert Xpress SARS-CoV-2/FLU/RSV assay is intended as an aid in  the diagnosis of  influenza from Nasopharyngeal swab specimens and  should not be used as a sole basis for treatment. Nasal washings and  aspirates are unacceptable for Xpert Xpress SARS-CoV-2/FLU/RSV  testing.  Fact Sheet for Patients: PinkCheek.be  Fact Sheet for Healthcare Providers: GravelBags.it  This test is not yet approved or cleared by the [place] FDA and  has been authorized for detection and/or diagnosis of SARS-CoV-2 by  FDA under an Emergency Use Authorization (EUA). This EUA will remain  in effect (meaning this test can be used) for the duration of the  Covid-19 declaration under Section 564(b)(1) of the Act, 21  U.S.C. section 360bbb-3(b)(1), unless the authorization is  terminated or revoked. Performed at [facility], [place] [address], [place], [place] [address]   Culture, blood (routine x 2)     Status: None   Collection Time: 06/01/20 11:47 AM   Specimen: BLOOD  Result Value Ref Range Status   Specimen Description   Final    BLOOD RIGHT ANTECUBITAL Performed at [place] [address], [place], [place] [address]    Special Requests   Final    BOTTLES DRAWN AEROBIC AND ANAEROBIC Blood Culture results may not be optimal due to an excessive volume of blood received in culture bottles Performed at [place] [address] [place], [place] [address]    Culture   Final    NO GROWTH 5 DAYS Performed at [facility], [place] [address], [place], [place] [address]    Report Status 06/06/2020 FINAL  Final  Culture, blood (routine x 2)     Status: None   Collection Time: 06/01/20 11:47 AM   Specimen: BLOOD  Result Value Ref Range Status   Specimen Description   Final    BLOOD LEFT ANTECUBITAL Performed at [place] [address], [place], [place] [address]    Special Requests   Final    BOTTLES DRAWN AEROBIC AND ANAEROBIC Blood Culture results may not be optimal due to an excessive volume of blood received in culture bottles Performed at [place] [address], [place], [place] [address]    Culture   Final    NO GROWTH 5 DAYS Performed at [facility], [place] [address] [place], [place] [address]    Report Status 06/06/2020 FINAL  Final  MRSA PCR Screening     Status: None   Collection Time: 06/02/20  7:00 AM   Specimen: Nasopharyngeal  Result Value Ref Range Status   MRSA by PCR NEGATIVE NEGATIVE Final    Comment:        The GeneXpert MRSA Assay (FDA approved for NASAL specimens only), is one component of a comprehensive MRSA colonization surveillance program. It is not intended to diagnose MRSA infection nor to guide or monitor treatment for MRSA infections. Performed at [facility], [place] [address] [place], [place] [address]      Radiology Studies: VAS US CAROTID  Result Date: 06/06/2020 Carotid Arterial Duplex Study Indications:   Pre operative TAVR. Risk Factors:  Hypertension, hyperlipidemia, Diabetes, PAD. Other Factors: Aortic valve disease. ESRD, s/p kidney transplant. Performing Technologist: Sharion Dove RVS  Examination Guidelines: A complete evaluation includes B-mode imaging, spectral Doppler, color Doppler, and power Doppler as needed of all accessible portions of each vessel. Bilateral testing is considered an integral part of a complete examination. Limited examinations for reoccurring indications may be performed as noted.  Right Carotid Findings: +----------+--------+--------+--------+------------------+------------------+  PSV cm/sEDV cm/sStenosisPlaque DescriptionComments           +----------+--------+--------+--------+------------------+------------------+ CCA Prox  91      11                                 intimal thickening +----------+--------+--------+--------+------------------+------------------+ CCA Distal66      14                                intimal thickening +----------+--------+--------+--------+------------------+------------------+ ICA Prox  90      21      1-39%   heterogenous      Shadowing          +----------+--------+--------+--------+------------------+------------------+ ICA Distal106     30                                                   +----------+--------+--------+--------+------------------+------------------+ ECA       82      16                                                   +----------+--------+--------+--------+------------------+------------------+ +----------+--------+-------+--------+-------------------+           PSV cm/sEDV cmsDescribeArm Pressure (mmHG) +----------+--------+-------+--------+-------------------+ [id number]                                         +----------+--------+-------+--------+-------------------+ +---------+--------+--+--------+--+ VertebralPSV cm/s83EDV cm/s12 +---------+--------+--+--------+--+  Left Carotid Findings: +----------+--------+--------+--------+------------------+------------------+           PSV cm/sEDV cm/sStenosisPlaque DescriptionComments           +----------+--------+--------+--------+------------------+------------------+ CCA Prox  92      23                                intimal thickening +----------+--------+--------+--------+------------------+------------------+ CCA Distal96      20                                intimal thickening +----------+--------+--------+--------+------------------+------------------+ ICA Prox  162     34      1-39%   heterogenous      Shadowing          +----------+--------+--------+--------+------------------+------------------+ ICA Distal91      21                                                    +----------+--------+--------+--------+------------------+------------------+ ECA       177     19                                                   +----------+--------+--------+--------+------------------+------------------+ +----------+--------+--------+--------+-------------------+  PSV cm/sEDV cm/sDescribeArm Pressure (mmHG) +----------+--------+--------+--------+-------------------+ [id number]                                         +----------+--------+--------+--------+-------------------+ +---------+--------+--+--------+--+ VertebralPSV cm/s48EDV cm/s17 +---------+--------+--+--------+--+   Summary: Right Carotid: Velocities in the right ICA are consistent with a 1-39% stenosis. Left Carotid: Velocities in the left ICA are consistent with a 1-39% stenosis. Vertebrals:  Bilateral vertebral arteries demonstrate antegrade flow. Subclavians: Normal flow hemodynamics were seen in bilateral subclavian              arteries. *See table(s) above for measurements and observations.  Electronically signed by Servando Snare MD on 06/06/2020 at 3:30:47 PM.    Final     Scheduled Meds: . (feeding supplement) PROSource Plus  30 mL Oral TID BM  . [START ON 06/08/2020] aspirin  81 mg Oral Pre-Cath  . atorvastatin  10 mg Oral Daily  . docusate sodium  100 mg Oral BID  . fenofibrate  54 mg Oral Daily  . glyBURIDE  5 mg Oral BID WC  . heparin  5,000 Units Subcutaneous Q8H  . insulin aspart  0-15 Units Subcutaneous TID WC  . insulin aspart  6 Units Subcutaneous TID WC  . insulin detemir  10 Units Subcutaneous QHS  . insulin detemir  15 Units Subcutaneous Daily  . mycophenolate  360 mg Oral BID  . pantoprazole  40 mg Oral Daily  . sodium chloride flush  3 mL Intravenous Q12H  . tacrolimus  1 mg Oral BID   Continuous Infusions: . sodium chloride 250 mL (06/01/20 1838)  . sodium chloride 1 mL/kg/hr (06/07/20 1103)     LOS: 6 days   Phillips Climes, MD [facility] Pager On Amion  If 7PM-7AM, please contact night-coverage 06/07/2020, 1:47 PM

## 2020-06-08 ENCOUNTER — Encounter (HOSPITAL_COMMUNITY)
Admission: EM | Disposition: A | Discharge status: Rehabilitation Facility | Payer: Self-pay | Source: Home / Self Care | Attending: Internal Medicine

## 2020-06-08 DIAGNOSIS — I350 Nonrheumatic aortic (valve) stenosis: Secondary | ICD-10-CM | POA: Diagnosis not present

## 2020-06-08 HISTORY — PX: RIGHT HEART CATH AND CORONARY ANGIOGRAPHY: CATH118264

## 2020-06-08 LAB — BASIC METABOLIC PANEL
Anion gap: 8 (ref 5–15)
BUN: 34 mg/dL — ABNORMAL HIGH (ref 8–23)
CO2: 21 mmol/L — ABNORMAL LOW (ref 22–32)
Calcium: 8.3 mg/dL — ABNORMAL LOW (ref 8.9–10.3)
Chloride: 107 mmol/L (ref 98–111)
Creatinine, Ser: 1.62 mg/dL — ABNORMAL HIGH (ref 0.61–1.24)
GFR, Estimated: 48 mL/min — ABNORMAL LOW (ref >60)
Glucose, Bld: 254 mg/dL — ABNORMAL HIGH (ref 70–99)
Potassium: 4.3 mmol/L (ref 3.5–5.1)
Sodium: 136 mmol/L (ref 135–145)

## 2020-06-08 LAB — CBC
HCT: 27.2 % — ABNORMAL LOW (ref 39.0–52.0)
Hemoglobin: 8.6 g/dL — ABNORMAL LOW (ref 13.0–17.0)
MCH: 26 pg (ref 26.0–34.0)
MCHC: 31.6 g/dL (ref 30.0–36.0)
MCV: 82.2 fL (ref 80.0–100.0)
Platelets: 142 10*3/uL — ABNORMAL LOW (ref 150–400)
RBC: 3.31 MIL/uL — ABNORMAL LOW (ref 4.22–5.81)
RDW: 14.3 % (ref 11.5–15.5)
WBC: 4.2 10*3/uL (ref 4.0–10.5)
nRBC: 0 % (ref 0.0–0.2)

## 2020-06-08 LAB — GLUCOSE, CAPILLARY
Glucose-Capillary: 225 mg/dL — ABNORMAL HIGH (ref 70–99)
Glucose-Capillary: 238 mg/dL — ABNORMAL HIGH (ref 70–99)
Glucose-Capillary: 188 mg/dL — ABNORMAL HIGH (ref 70–99)
Glucose-Capillary: 138 mg/dL — ABNORMAL HIGH (ref 70–99)
Glucose-Capillary: 187 mg/dL — ABNORMAL HIGH (ref 70–99)

## 2020-06-08 LAB — POCT I-STAT EG7
Acid-base deficit: 3 mmol/L — ABNORMAL HIGH (ref 0.0–2.0)
Bicarbonate: 21.4 mmol/L (ref 20.0–28.0)
Calcium, Ion: 1.22 mmol/L (ref 1.15–1.40)
HCT: 26 % — ABNORMAL LOW (ref 39.0–52.0)
Hemoglobin: 8.8 g/dL — ABNORMAL LOW (ref 13.0–17.0)
O2 Saturation: 60 %
Potassium: 4.3 mmol/L (ref 3.5–5.1)
Sodium: 140 mmol/L (ref 135–145)
TCO2: 22 mmol/L (ref 22–32)
pCO2, Ven: 36.5 mmHg — ABNORMAL LOW (ref 44.0–60.0)
pH, Ven: 7.375 (ref 7.250–7.430)
pO2, Ven: 31 mmHg — CL (ref 32.0–45.0)

## 2020-06-08 LAB — POCT I-STAT 7, (LYTES, BLD GAS, ICA,H+H)
Acid-base deficit: 4 mmol/L — ABNORMAL HIGH (ref 0.0–2.0)
Bicarbonate: 20.2 mmol/L (ref 20.0–28.0)
Calcium, Ion: 1.21 mmol/L (ref 1.15–1.40)
HCT: 26 % — ABNORMAL LOW (ref 39.0–52.0)
Hemoglobin: 8.8 g/dL — ABNORMAL LOW (ref 13.0–17.0)
O2 Saturation: 99 %
Potassium: 4.3 mmol/L (ref 3.5–5.1)
Sodium: 139 mmol/L (ref 135–145)
TCO2: 21 mmol/L — ABNORMAL LOW (ref 22–32)
pCO2 arterial: 30.7 mmHg — ABNORMAL LOW (ref 32.0–48.0)
pH, Arterial: 7.426 (ref 7.350–7.450)
pO2, Arterial: 135 mmHg — ABNORMAL HIGH (ref 83.0–108.0)

## 2020-06-08 LAB — SURGICAL PATHOLOGY

## 2020-06-08 SURGERY — RIGHT HEART CATH AND CORONARY ANGIOGRAPHY
Anesthesia: LOCAL

## 2020-06-08 MED ORDER — SODIUM CHLORIDE 0.9% FLUSH: 3.0000 mL | INTRAVENOUS | Status: DC | PRN

## 2020-06-08 MED ORDER — VERAPAMIL HCL 2.5 MG/ML IV SOLN
INTRAVENOUS | Status: AC
  Filled 2020-06-08: qty 2

## 2020-06-08 MED ORDER — FENTANYL CITRATE (PF) 100 MCG/2ML IJ SOLN
INTRAMUSCULAR | Status: DC | PRN
  Administered 2020-06-08: 25 ug via INTRAVENOUS

## 2020-06-08 MED ORDER — HEPARIN SODIUM (PORCINE) 1000 UNIT/ML IJ SOLN
INTRAMUSCULAR | Status: AC
  Filled 2020-06-08: qty 1

## 2020-06-08 MED ORDER — SODIUM CHLORIDE 0.9 % IV SOLN: 250.0000 mL | INTRAVENOUS | Status: DC | PRN

## 2020-06-08 MED ORDER — LIDOCAINE HCL (PF) 1 % IJ SOLN
INTRAMUSCULAR | Status: DC | PRN
  Administered 2020-06-08: 5 mL

## 2020-06-08 MED ORDER — FUROSEMIDE 20 MG PO TABS
20.0000 mg | ORAL_TABLET | Freq: Every day | ORAL | Status: DC
  Administered 2020-06-08 – 2020-06-10 (×3): 20 mg via ORAL
  Filled 2020-06-08 (×3): qty 1

## 2020-06-08 MED ORDER — SODIUM CHLORIDE 0.9% FLUSH
3.0000 mL | Freq: Two times a day (BID) | INTRAVENOUS | Status: DC
  Administered 2020-06-08 – 2020-06-10 (×4): 3 mL via INTRAVENOUS

## 2020-06-08 MED ORDER — MIDAZOLAM HCL 2 MG/2ML IJ SOLN
INTRAMUSCULAR | Status: DC | PRN
  Administered 2020-06-08: 1 mg via INTRAVENOUS

## 2020-06-08 MED ORDER — IOHEXOL 350 MG/ML SOLN
INTRAVENOUS | Status: DC | PRN
  Administered 2020-06-08: 45 mL

## 2020-06-08 MED ORDER — HYDRALAZINE HCL 20 MG/ML IJ SOLN: 10.0000 mg | INTRAMUSCULAR | Status: AC | PRN

## 2020-06-08 MED ORDER — LABETALOL HCL 5 MG/ML IV SOLN: 10.0000 mg | INTRAVENOUS | Status: AC | PRN

## 2020-06-08 MED ORDER — HEPARIN (PORCINE) IN NACL 1000-0.9 UT/500ML-% IV SOLN
INTRAVENOUS | Status: DC | PRN
  Administered 2020-06-08 (×2): 500 mL

## 2020-06-08 MED ORDER — LIDOCAINE HCL (PF) 1 % IJ SOLN
INTRAMUSCULAR | Status: AC
  Filled 2020-06-08: qty 30

## 2020-06-08 MED ORDER — VERAPAMIL HCL 2.5 MG/ML IV SOLN
INTRAVENOUS | Status: DC | PRN
  Administered 2020-06-08 (×2): 10 mL via INTRA_ARTERIAL

## 2020-06-08 MED ORDER — HEPARIN SODIUM (PORCINE) 5000 UNIT/ML IJ SOLN
5000.0000 [IU] | Freq: Three times a day (TID) | INTRAMUSCULAR | Status: DC
  Administered 2020-06-08 – 2020-06-10 (×6): 5000 [IU] via SUBCUTANEOUS
  Filled 2020-06-08 (×6): qty 1

## 2020-06-08 MED ORDER — HEPARIN SODIUM (PORCINE) 1000 UNIT/ML IJ SOLN
INTRAMUSCULAR | Status: DC | PRN
  Administered 2020-06-08: 5000 [IU] via INTRAVENOUS

## 2020-06-08 MED ORDER — HYDROMORPHONE HCL 1 MG/ML IJ SOLN
INTRAMUSCULAR | Status: AC
  Filled 2020-06-08: qty 0.5

## 2020-06-08 MED ORDER — HEPARIN (PORCINE) IN NACL 1000-0.9 UT/500ML-% IV SOLN
INTRAVENOUS | Status: AC
  Filled 2020-06-08: qty 1000

## 2020-06-08 MED ORDER — FENTANYL CITRATE (PF) 100 MCG/2ML IJ SOLN
INTRAMUSCULAR | Status: AC
  Filled 2020-06-08: qty 2

## 2020-06-08 MED ORDER — INSULIN ASPART 100 UNIT/ML ~~LOC~~ SOLN
SUBCUTANEOUS | Status: AC
  Filled 2020-06-08: qty 1

## 2020-06-08 MED ORDER — MIDAZOLAM HCL 2 MG/2ML IJ SOLN
INTRAMUSCULAR | Status: AC
  Filled 2020-06-08: qty 2

## 2020-06-08 SURGICAL SUPPLY — 14 items
CATH 5FR JL3.5 JR4 ANG PIG MP (CATHETERS) ×1 IMPLANT
CATH SWAN GANZ 7F STRAIGHT (CATHETERS) ×1 IMPLANT
DEVICE RAD COMP TR BAND LRG (VASCULAR PRODUCTS) ×1 IMPLANT
GLIDESHEATH SLEND SS 6F .021 (SHEATH) ×1 IMPLANT
GLIDESHEATH SLENDER 7FR .021G (SHEATH) ×1 IMPLANT
GUIDEWIRE INQWIRE 1.5J.035X260 (WIRE) IMPLANT
INQWIRE 1.5J .035X260CM (WIRE) ×2
KIT HEART LEFT (KITS) ×2 IMPLANT
PACK CARDIAC CATHETERIZATION (CUSTOM PROCEDURE TRAY) ×2 IMPLANT
SHEATH GLIDE SLENDER 4/5FR (SHEATH) ×1 IMPLANT
SHEATH PROBE COVER 6X72 (BAG) ×1 IMPLANT
TRANSDUCER W/STOPCOCK (MISCELLANEOUS) ×2 IMPLANT
TUBING CIL FLEX 10 FLL-RA (TUBING) ×2 IMPLANT
WIRE HI TORQ VERSACORE-J 145CM (WIRE) ×1 IMPLANT

## 2020-06-08 NOTE — Progress Notes (Signed)
Physical Therapy Treatment Patient Details Name: Steven [name] MRN: [id number] DOB: March 17, 1956 Today's Date: 06/08/2020    History of Present Illness Pt is a 64 y.o. male admitted 06/01/20 with worsening L foot diabetic pressure ulcer. 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 PMH includes obesity, OSA, HTN, DM2, ESRD (s/p renal transplant), HLD.    PT Comments    Patient received in bed, reports no pain currently. Agrees to get up and move. States he feels like he may need to go sit on the toilet. Patient going for procedure shortly. He is mod independent with bed mobility, use of rails. He requires mod assist for sit to stand with cues for hand placement. He requires mod assist for ambulation with RW. Ambulated 5 feet then 5 feet more after seated rest. Demonstrates decreased balance with mobility, requiring min/mod assist to prevent lob.  He gets very fatigued and sob with activity. Patient will continue to benefit from skilled PT while here to improve endurance, safety, and independence.         Follow Up Recommendations  CIR;Supervision for mobility/OOB     Equipment Recommendations  Other (comment) (TBD next venue)    Recommendations for Other Services       Precautions / Restrictions Precautions Precautions: Fall Precaution Comments: L BKA wound back; watch SpO2/HR Required Braces or Orthoses: Other Brace Other Brace: limb protector Restrictions Weight Bearing Restrictions: Yes LLE Weight Bearing: Non weight bearing    Mobility  Bed Mobility Overal bed mobility: Modified Independent Bed Mobility: Supine to Sit     Supine to sit: Modified independent (Device/Increase time)     General bed mobility comments: no physical assist needed for bed mobility  Transfers Overall transfer level: Needs assistance Equipment used: Rolling walker (2 wheeled) Transfers: Sit to/from [facility] Sit to Stand: Mod assist Stand pivot transfers: Mod assist        General transfer comment: Consistent min-modA for maintaining balance with trunk elevation and transitioning UE support to RW  Ambulation/Gait Ambulation/Gait assistance: Mod assist;Min assist Gait Distance (Feet): 10 Feet Assistive device: Rolling walker (2 wheeled) Gait Pattern/deviations: Step-to pattern;Decreased stride length Gait velocity: Decreased   General Gait Details: Slow, fatigued gait with RW; heavy reliance on BUE support to hop on RLE; instability requiring min-modA to prevent posterior LOB; increased RR with activity and SOB with activity limiting progression. He requested to use [facility] prior to his procedure this morning, therefore session limited.   Stairs             Wheelchair Mobility    Modified Rankin (Stroke Patients Only)       Balance Overall balance assessment: Needs assistance Sitting-balance support: Feet supported Sitting balance-Leahy Scale: Good     Standing balance support: Bilateral upper extremity supported;During functional activity Standing balance-Leahy Scale: Poor Standing balance comment: Dependent on BUE support, reliant on external assist for balance and safety                            Cognition Arousal/Alertness: Awake/alert Behavior During Therapy: WFL for tasks assessed/performed Overall Cognitive Status: Within Functional Limits for tasks assessed                                        Exercises      General Comments        Pertinent Vitals/Pain Pain Assessment:  No/denies pain    Home Living                      Prior Function            PT Goals (current goals can now be found in the care plan section) Acute Rehab PT Goals Patient Stated Goal: to be able to be active again to watch son play competitive baseball PT Goal Formulation: With patient Time For Goal Achievement: 06/18/20 Potential to Achieve Goals: Good Progress towards PT goals: Progressing toward goals     Frequency    Min 3X/week      PT Plan Current plan remains appropriate    Co-evaluation              AM-PAC PT "6 Clicks" Mobility   Outcome Measure  Help needed turning from your back to your side while in a flat bed without using bedrails?: None Help needed moving from lying on your back to sitting on the side of a flat bed without using bedrails?: A Little Help needed moving to and from a bed to a chair (including a wheelchair)?: A Little Help needed standing up from a chair using your arms (e.g., wheelchair or bedside chair)?: A Lot Help needed to walk in hospital room?: A Lot Help needed climbing 3-5 steps with a railing? : Total 6 Click Score: 15    End of Session Equipment Utilized During Treatment: Gait belt Activity Tolerance: Patient limited by fatigue Patient left: Other (comment) (BSC, RN notified) Nurse Communication: Mobility status;Other (comment) (Infusion complete and beeping) PT Visit Diagnosis: Other abnormalities of gait and mobility (R26.89);Muscle weakness (generalized) (M62.81);Unsteadiness on feet (R26.81);Difficulty in walking, not elsewhere classified (R26.2)     Time: 6962-9528 PT Time Calculation (min) (ACUTE ONLY): 13 min  Charges:  $Gait Training: 8-22 mins                     Blayke Pinera, PT, GCS 06/08/20,9:54 AM

## 2020-06-08 NOTE — Progress Notes (Signed)
Inpatient Rehabilitation-Admissions Coordinator   Noted plans for heart cath today. Will need updated therapy notes prior to opening insurance case for possible CIR admission. Will follow for medical readiness as well as insurance authorization.   Raechel Ache, OTR/L  Rehab Admissions Coordinator  [phone] 06/08/2020 9:29 AM

## 2020-06-08 NOTE — Progress Notes (Signed)
PROGRESS NOTE    Steven [name]  SHF:[id number] DOB: 1955/10/15 DOA: 06/01/2020 PCP: Isaac Bliss, Rayford Halsted, MD    Brief Narrative:  64 year old male with a past medical history of obesity, OSA, hypertension, hyperlipidemia, type 2 diabetes ESRD s/p renal transplant in 2006 on immunosuppressants presented to [facility] ED due to a worsening diabetic pressure ulcer on the bottom of his left foot of 1 to 2 weeks duration. He initially took Keflex without improvement and was switched to Doxy.  No improvement.  Presented to the ED for further evaluation and management of his wound.  Work up revealed left foot osteomyelitis by MRI.  Subjective: -Patient denies any chest pain or shortness of breath, reports pain is controlled.  Assessment & Plan:   Principal Problem:   Osteomyelitis of foot ([place]) Active Problems:   Diabetes mellitus ([place])   Hyperlipidemia   Kidney transplant recipient   Open wound of left foot   Diabetic foot infection ([place])   CKD (chronic kidney disease), stage III (HCC)   Aortic valve stenosis  Osteomyelitis of the left foot with cellulitis, failed outpatient antibiotics -Patient initially on broad-spectrum IV antibiotics, IV vancomycin and cefepime, vancomycin later changed to IV linezolid given AKI . -Currently off antibiotics. - MRI left foot is consistent with osteomyelitis. -Orthopedic input greatly appreciated, status post tibial amputation by Dr. Sharol Given on 11/3.  . -PT/OT consulted.   -CIR consulted.  Awaiting insurance authorization.  Non oliguric AKI on CKD 3B status post renal transplant on immunosuppressants likely prerenal in the setting of dehydration -Continue with immunosuppressive therapy. -Creatinine at baseline, continue to monitor closely as we will need IV diuresis. - Nephrology has signed off -Creatinine has improved , it is 1.6 today, will monitor closely I will start him on low-dose Lasix.  Acute diastolic CHF/severe aortic stenosis -On  Lasix as needed, has mild lower extremity edema -With severe aortic stenosis, new finding, cardiology has been consulted.  He is currently being planned for TAVR work-up, discussed with cardiology, patient went for right/left cardiac cath this morning, cardiology will continue to pursue TAVR work-up as an outpatient. -Volume status appears to be improving despite not receiving any Lasix, as well his kidney function has been improving as well. -With evidence of mild volume overload, discussed with cardiology, will start on low-dose Lasix.  Nonhealing bilateral lower extremity diabetic foot wound Continue local wound care Orthopedic Surgery following, plan for surgery today  Type 2 diabetes on insulin a1c of 9.3 -CBG improved on current regimen of Levemir 15 units daily, 10 nightly, 6 units before meals and sliding scale.   Hyperlipidemia/GERD Continue home regimen  Physical debility PT/OT consulted.   DVT prophylaxis: Heparin subq Code Status: Full Family Communication: none at bedside  Status is: Inpatient  Remains inpatient appropriate because:Ongoing diagnostic testing needed not appropriate for outpatient work up and Inpatient level of care appropriate due to severity of illness   Dispo:  Patient From: Home  Planned Disposition: CIR  Expected discharge date: 06/08/2020  Medically stable for discharge: yes, once accepted by CIR       Consultants:   Orthopedic Surgery  cardiology  Procedures:  Transtibial amputation Application of Prevena wound VAC by Dr. Sharol Given on 11/3. -Cardiac cath 11/8  Antimicrobials: Anti-infectives (From admission, onward)   Start     Dose/Rate Route Frequency Ordered Stop   06/03/20 0800  ceFAZolin (ANCEF) IVPB 2g/100 mL premix        2 g 200 mL/hr over 30 Minutes Intravenous On  call to O.R. 06/03/20 0701 06/03/20 1644   06/03/20 0600  vancomycin (VANCOREADY) IVPB 1500 mg/300 mL  Status:  Discontinued        1,500 mg 150 mL/hr over  120 Minutes Intravenous Every 36 hours 06/01/20 1826 06/02/20 1130   06/03/20 0000  ceFEPIme (MAXIPIME) 2 g in sodium chloride 0.9 % 100 mL IVPB        2 g 200 mL/hr over 30 Minutes Intravenous Every 12 hours 06/02/20 1408 06/04/20 1330   06/02/20 1500  ceFEPIme (MAXIPIME) 2 g in sodium chloride 0.9 % 100 mL IVPB        2 g 200 mL/hr over 30 Minutes Intravenous  Once 06/02/20 1408 06/02/20 1653   06/02/20 1400  linezolid (ZYVOX) IVPB 600 mg        600 mg 300 mL/hr over 60 Minutes Intravenous Every 12 hours 06/02/20 1122 06/04/20 2226   06/01/20 1845  vancomycin (VANCOCIN) IVPB 1000 mg/200 mL premix  Status:  Discontinued        1,000 mg 200 mL/hr over 60 Minutes Intravenous  Once 06/01/20 1759 06/01/20 1815   06/01/20 1845  vancomycin (VANCOREADY) IVPB 2000 mg/400 mL        2,000 mg 200 mL/hr over 120 Minutes Intravenous  Once 06/01/20 1816 06/01/20 2042   06/01/20 1045  piperacillin-tazobactam (ZOSYN) IVPB 3.375 g        3.375 g 100 mL/hr over 30 Minutes Intravenous  Once 06/01/20 1043 06/01/20 1237        Objective: Vitals:   06/08/20 1345 06/08/20 1400 06/08/20 1415 06/08/20 1442  BP: 138/77 (!) 157/79 (!) 151/79 132/74  Pulse: 75 76 75 80  Resp: (!) 22 19 (!) 21 18  Temp:    98.6 F (37 C)  TempSrc:    Oral  SpO2: 100% 100% 100% 100%  Weight:      Height:        Intake/Output Summary (Last 24 hours) at 06/08/2020 1459 Last data filed at 06/08/2020 1415 Gross per 24 hour  Intake 1706.23 ml  Output 500 ml  Net 1206.23 ml   Filed Weights   06/01/20 1420 06/03/20 1452 06/08/20 0412  Weight: 111.5 kg 111.5 kg 106.4 kg    Examination:  Awake Alert, Oriented X 3, No new F.N deficits, Normal affect Symmetrical Chest wall movement, Good air movement bilaterally, CTAB RRR,No Gallops,Rubs ,+ Murmurs, No Parasternal Heave +ve B.Sounds, Abd Soft, No tenderness, No rebound - guarding or rigidity.   No Cyanosis, right lower extremity with trace edema, left status post BKA,  on wound VAC.  Data Reviewed: I have personally reviewed following labs and imaging studies  CBC: Recent Labs  Lab 06/03/20 0321 06/04/20 0832 06/05/20 0253 06/06/20 0510 06/08/20 0324  WBC 4.7 5.9 5.3 4.4 4.2  NEUTROABS 3.5  --   --   --   --   HGB 8.8* 9.8* 9.2* 9.1* 8.6*  HCT 28.1* 31.1* 28.5* 29.1* 27.2*  MCV 82.2 81.2 80.5 79.9* 82.2  PLT 150 166 162 150 409*   Basic Metabolic Panel: Recent Labs  Lab 06/03/20 0321 06/03/20 0321 06/04/20 0832 06/04/20 0832 06/04/20 1349 06/05/20 0253 06/06/20 0510 06/07/20 0239 06/08/20 0324  NA 132*   < > 130*  --   --  132* 136 137 136  K 4.2   < > 5.3*  --   --  4.4 5.0 4.4 4.3  CL 102   < > 99  --   --  102 108  108 107  CO2 20*   < > 19*  --   --  19* 20* 22 21*  GLUCOSE 354*   < > 444*   < > 433* 160* 232* 218* 254*  BUN 41*   < > 36*  --   --  47* 43* 37* 34*  CREATININE 2.22*   < > 2.41*  --   --  2.49* 1.98* 1.69* 1.62*  CALCIUM 8.3*   < > 8.8*  --   --  8.8* 8.7* 8.3* 8.3*  MG 1.7  --   --   --   --   --   --   --   --    < > = values in this interval not displayed.   GFR: Estimated Creatinine Clearance: 58.6 mL/min (A) (by C-G formula based on SCr of 1.62 mg/dL (H)). Liver Function Tests: Recent Labs  Lab 06/03/20 0321  AST 15  ALT 26  ALKPHOS 113  BILITOT 1.0  PROT 6.2*  ALBUMIN 3.0*   No results for input(s): LIPASE, AMYLASE in the last 168 hours. No results for input(s): AMMONIA in the last 168 hours. Coagulation Profile: No results for input(s): INR, PROTIME in the last 168 hours. Cardiac Enzymes: No results for input(s): CKTOTAL, CKMB, CKMBINDEX, TROPONINI in the last 168 hours. BNP (last 3 results) No results for input(s): PROBNP in the last 8760 hours. HbA1C: No results for input(s): HGBA1C in the last 72 hours. CBG: Recent Labs  Lab 06/07/20 1624 06/07/20 2031 06/08/20 0629 06/08/20 1131 06/08/20 1417  GLUCAP 156* 203* 225* 238* 188*   Lipid Profile: No results for input(s): CHOL, HDL,  LDLCALC, TRIG, CHOLHDL, LDLDIRECT in the last 72 hours. Thyroid Function Tests: No results for input(s): TSH, T4TOTAL, FREET4, T3FREE, THYROIDAB in the last 72 hours. Anemia Panel: No results for input(s): VITAMINB12, FOLATE, FERRITIN, TIBC, IRON, RETICCTPCT in the last 72 hours. Sepsis Labs: No results for input(s): PROCALCITON, LATICACIDVEN in the last 168 hours.  Recent Results (from the past 240 hour(s))  Respiratory Panel by RT PCR (Flu A&B, Covid) - Nasopharyngeal Swab     Status: None   Collection Time: 06/01/20 11:02 AM   Specimen: Nasopharyngeal Swab  Result Value Ref Range Status   SARS Coronavirus 2 by RT PCR NEGATIVE NEGATIVE Final    Comment: (NOTE) SARS-CoV-2 target nucleic acids are NOT DETECTED.  The SARS-CoV-2 RNA is generally detectable in upper respiratoy specimens during the acute phase of infection. The lowest concentration of SARS-CoV-2 viral copies this assay can detect is 131 copies/mL. A negative result does not preclude SARS-Cov-2 infection and should not be used as the sole basis for treatment or other patient management decisions. A negative result may occur with  improper specimen collection/handling, submission of specimen other than nasopharyngeal swab, presence of viral mutation(s) within the areas targeted by this assay, and inadequate number of viral copies (<131 copies/mL). A negative result must be combined with clinical observations, patient history, and epidemiological information. The expected result is Negative.  Fact Sheet for Patients:  PinkCheek.be  Fact Sheet for Healthcare Providers:  GravelBags.it  This test is no t yet approved or cleared by the [place] FDA and  has been authorized for detection and/or diagnosis of SARS-CoV-2 by FDA under an Emergency Use Authorization (EUA). This EUA will remain  in effect (meaning this test can be used) for the duration of  the COVID-19 declaration under Section 564(b)(1) of the Act, 21 U.S.C. section 360bbb-3(b)(1), unless the authorization  is terminated or revoked sooner.     Influenza A by PCR NEGATIVE NEGATIVE Final   Influenza B by PCR NEGATIVE NEGATIVE Final    Comment: (NOTE) The Xpert Xpress SARS-CoV-2/FLU/RSV assay is intended as an aid in  the diagnosis of influenza from Nasopharyngeal swab specimens and  should not be used as a sole basis for treatment. Nasal washings and  aspirates are unacceptable for Xpert Xpress SARS-CoV-2/FLU/RSV  testing.  Fact Sheet for Patients: PinkCheek.be  Fact Sheet for Healthcare Providers: GravelBags.it  This test is not yet approved or cleared by the [place] FDA and  has been authorized for detection and/or diagnosis of SARS-CoV-2 by  FDA under an Emergency Use Authorization (EUA). This EUA will remain  in effect (meaning this test can be used) for the duration of the  Covid-19 declaration under Section 564(b)(1) of the Act, 21  U.S.C. section 360bbb-3(b)(1), unless the authorization is  terminated or revoked. Performed at [facility], [place] [address] [place], [place] [address]   Culture, blood (routine x 2)     Status: None   Collection Time: 06/01/20 11:47 AM   Specimen: BLOOD  Result Value Ref Range Status   Specimen Description   Final    BLOOD RIGHT ANTECUBITAL Performed at [facility] [address], [place], [place] [address]    Special Requests   Final    BOTTLES DRAWN AEROBIC AND ANAEROBIC Blood Culture results may not be optimal due to an excessive volume of blood received in culture bottles Performed at [facility] [address], [place], [place] [address]    Culture   Final    NO GROWTH 5 DAYS Performed at [facility], [place] [address], [place], [place] [address]    Report Status 06/06/2020 FINAL  Final   Culture, blood (routine x 2)     Status: None   Collection Time: 06/01/20 11:47 AM   Specimen: BLOOD  Result Value Ref Range Status   Specimen Description   Final    BLOOD LEFT ANTECUBITAL Performed at [facility] [address] [place], [place] [address]    Special Requests   Final    BOTTLES DRAWN AEROBIC AND ANAEROBIC Blood Culture results may not be optimal due to an excessive volume of blood received in culture bottles Performed at [facility] [address] [place], [place] [address]    Culture   Final    NO GROWTH 5 DAYS Performed at [facility], [place] [address] [place], [place] [address]    Report Status 06/06/2020 FINAL  Final  MRSA PCR Screening     Status: None   Collection Time: 06/02/20  7:00 AM   Specimen: Nasopharyngeal  Result Value Ref Range Status   MRSA by PCR NEGATIVE NEGATIVE Final    Comment:        The GeneXpert MRSA Assay (FDA approved for NASAL specimens only), is one component of a comprehensive MRSA colonization surveillance program. It is not intended to diagnose MRSA infection nor to guide or monitor treatment for MRSA infections. Performed at [facility], [place] [address] [place], [place] [address]      Radiology Studies: CARDIAC CATHETERIZATION  Result Date: 06/08/2020 Conclusions: 1. Mild to moderate, non-obstructive coronary artery disease with 30-40% mid LAD stenosis.  No significant disease identified in the LCx or RCA. 2. Moderately to severely elevated left heart, right heart, and pulmonary artery pressures.  3. Normal cardiac output/index. Recommendations: 1. Ongoing workup of severe aortic stenosis per valve team. 2. Maintain net even to slightly negative fluid balance today.  If renal function remains stable, careful escalation of diuresis is recommended. 3. Medical therapy and risk factor modification to prevent progression of coronary artery disease. Nelva Bush,  MD [facility]    Scheduled Meds: . (feeding supplement) PROSource Plus  30 mL Oral TID BM  . atorvastatin  10 mg Oral Daily  . docusate sodium  100 mg Oral BID  . fenofibrate  54 mg Oral Daily  . glyBURIDE  5 mg Oral BID WC  . heparin  5,000 Units Subcutaneous Q8H  . insulin aspart  0-15 Units Subcutaneous TID WC  . insulin aspart  6 Units Subcutaneous TID WC  . insulin detemir  10 Units Subcutaneous QHS  . insulin detemir  15 Units Subcutaneous Daily  . mycophenolate  360 mg Oral BID  . pantoprazole  40 mg Oral Daily  . sodium chloride flush  3 mL Intravenous Q12H  . tacrolimus  1 mg Oral BID   Continuous Infusions: . sodium chloride 250 mL (06/01/20 1838)  . sodium chloride       LOS: 7 days   Phillips Climes, MD [facility] Pager On Amion  If 7PM-7AM, please contact night-coverage 06/08/2020, 2:59 PM

## 2020-06-08 NOTE — Progress Notes (Signed)
TR BAND REMOVAL  LOCATION:    Right radial  DEFLATED PER PROTOCOL:    Yes.    TIME BAND OFF / DRESSING APPLIED:    1400pm A clean dry dressing with gauze and tegaderm    SITE UPON ARRIVAL:    Level 0  SITE AFTER BAND REMOVAL:    Level 0  CIRCULATION SENSATION AND MOVEMENT:    Within Normal Limits   Yes.    COMMENTS:   Care instruction given to patient.

## 2020-06-08 NOTE — Progress Notes (Signed)
  [facility]ENTER   MULTIDISCIPLINARY HEART VALVE TEAM]  Pt had cardiac cath today which showed mild-mod non obstructive CAD, moderately to severely elevated left heart, right heart, and pulmonary artery pressures and normal CO/CI. Maintain net even to slightly negative fluid balance today.  If renal function remains stable, careful escalation of diuresis is recommended. Rx therapy and Rx modification for CAD.   Structural team will sign off. We will finish the rest of TAVR work up as an outpatient after he completes his rehab.  Angelena Form PA-C  [facility]

## 2020-06-08 NOTE — Progress Notes (Signed)
Inpatient Diabetes Program Recommendations  AACE/ADA: New Consensus Statement on Inpatient Glycemic Control (2015)  Target Ranges:  Prepandial:   less than 140 mg/dL      Peak postprandial:   less than 180 mg/dL (1-2 hours)      Critically ill patients:  140 - 180 mg/dL   Lab Results  Component Value Date   GLUCAP 225 (H) 06/08/2020   HGBA1C 9.3 (H) 06/01/2020    Review of Glycemic Control Results for [name], Steven [name]" (MRN [id number]) as of 06/08/2020 09:30  Ref. Range 06/07/2020 11:51 06/07/2020 16:24 06/07/2020 20:31 06/08/2020 06:29  Glucose-Capillary Latest Ref Range: 70 - 99 mg/dL 202 (H) 156 (H) 203 (H) 225 (H)   Diabetes history: Type 2 DM Outpatient Diabetes medications: Levemir 10 units QD, Novolog 0-30 units TID, Glyburide 5 mg BID Current orders for Inpatient glycemic control: Levemir 15 units QD, Levemir 10 units QHS, Novolog 6 units TID, Glyburide 5 mg BID, Novolog 0-15 units TID  Inpatient Diabetes Program Recommendations:    Consider slight increase to QHS dose of Levemir: Levemir 12 units QHS.   Thanks, Bronson Curb, MSN, RNC-OB Diabetes Coordinator [phone] (8a-5p)

## 2020-06-08 NOTE — Progress Notes (Signed)
Inpatient Rehabilitation-Admissions Coordinator   Need updated OT treatment note in order to begin insurance authorization process for possible CIR admit. Have requested assist from acute therapy office.   Raechel Ache, OTR/L  Rehab Admissions Coordinator  [phone] 06/08/2020 3:01 PM

## 2020-06-08 NOTE — Interval H&P Note (Signed)
History and Physical Interval Note:  06/08/2020 10:23 AM  Steven [name]  has presented today for surgery, with the diagnosis of aortic stenosis.  The various methods of treatment have been discussed with the patient and family. After consideration of risks, benefits and other options for treatment, the patient has consented to  Procedure(s): RIGHT/LEFT HEART CATH AND CORONARY ANGIOGRAPHY (N/A) as a surgical intervention.  The patient's history has been reviewed, patient examined, no change in status, stable for surgery.  I have reviewed the patient's chart and labs.  Questions were answered to the patient's satisfaction.    Cath Lab Visit (complete for each Cath Lab visit)  Clinical Evaluation Leading to the Procedure:   ACS: No.  Non-ACS:    Anginal Classification: CCS I  Anti-ischemic medical therapy: No Therapy  Non-Invasive Test Results: No non-invasive testing performed  Prior CABG: No previous CABG  [name]

## 2020-06-09 ENCOUNTER — Encounter (HOSPITAL_COMMUNITY): Payer: Self-pay | Admitting: Internal Medicine

## 2020-06-09 LAB — BASIC METABOLIC PANEL
Anion gap: 8 (ref 5–15)
BUN: 30 mg/dL — ABNORMAL HIGH (ref 8–23)
CO2: 23 mmol/L (ref 22–32)
Calcium: 8.4 mg/dL — ABNORMAL LOW (ref 8.9–10.3)
Chloride: 107 mmol/L (ref 98–111)
Creatinine, Ser: 1.7 mg/dL — ABNORMAL HIGH (ref 0.61–1.24)
GFR, Estimated: 45 mL/min — ABNORMAL LOW (ref >60)
Glucose, Bld: 162 mg/dL — ABNORMAL HIGH (ref 70–99)
Potassium: 4.3 mmol/L (ref 3.5–5.1)
Sodium: 138 mmol/L (ref 135–145)

## 2020-06-09 LAB — CBC
HCT: 27.4 % — ABNORMAL LOW (ref 39.0–52.0)
Hemoglobin: 8.5 g/dL — ABNORMAL LOW (ref 13.0–17.0)
MCH: 25.3 pg — ABNORMAL LOW (ref 26.0–34.0)
MCHC: 31 g/dL (ref 30.0–36.0)
MCV: 81.5 fL (ref 80.0–100.0)
Platelets: 136 10*3/uL — ABNORMAL LOW (ref 150–400)
RBC: 3.36 MIL/uL — ABNORMAL LOW (ref 4.22–5.81)
RDW: 14.6 % (ref 11.5–15.5)
WBC: 4.6 10*3/uL (ref 4.0–10.5)
nRBC: 0 % (ref 0.0–0.2)

## 2020-06-09 LAB — GLUCOSE, CAPILLARY
Glucose-Capillary: 147 mg/dL — ABNORMAL HIGH (ref 70–99)
Glucose-Capillary: 119 mg/dL — ABNORMAL HIGH (ref 70–99)
Glucose-Capillary: 103 mg/dL — ABNORMAL HIGH (ref 70–99)
Glucose-Capillary: 105 mg/dL — ABNORMAL HIGH (ref 70–99)

## 2020-06-09 NOTE — Progress Notes (Signed)
Inpatient Rehabilitation-Admissions Coordinator   Insurance case for CIR initiated. Will update as soon as there is a determination.   Raechel Ache, OTR/L  Rehab Admissions Coordinator  [phone] 06/09/2020 9:40 AM

## 2020-06-09 NOTE — Progress Notes (Signed)
PROGRESS NOTE    Steven [name]  CZY:[id number] DOB: [birth year] DOA: 06/01/2020 PCP: Isaac Bliss, Rayford Halsted, MD    Brief Narrative:   64 year old male with a past medical history of obesity, OSA, hypertension, hyperlipidemia, type 2 diabetes ESRD s/p renal transplant in 2006 on immunosuppressants presented to [facility] ED due to a worsening diabetic pressure ulcer on the bottom of his left foot of 1 to 2 weeks duration. He initially took Keflex without improvement and was switched to Doxy.  No improvement.  Presented to the ED for further evaluation and management of his wound.  Work up revealed left foot osteomyelitis by MRI.  Subjective:  -Patient denies any chest pain or shortness of breath, reports pain is controlled.  Assessment & Plan:   Principal Problem:   Osteomyelitis of foot ([place]) Active Problems:   Diabetes mellitus ([place])   Hyperlipidemia   Kidney transplant recipient   Open wound of left foot   Diabetic foot infection ([place])   CKD (chronic kidney disease), stage III ([place])   Aortic valve stenosis  Osteomyelitis of the left foot with cellulitis, failed outpatient antibiotics -Patient initially on broad-spectrum IV antibiotics, IV vancomycin and cefepime, vancomycin later changed to IV linezolid given AKI , antibiotics were stopped 24 hours after surgery - MRI left foot is consistent with osteomyelitis. status post tibial amputation by Dr. Sharol Given on 11/3.  . -PT/OT consulted.  Recommendation for CIR, awaiting insurance authorization.  Non oliguric AKI on CKD 3B status post renal transplant on immunosuppressants likely prerenal in the setting of dehydration -Continue with immunosuppressive therapy. -Continuing back to baseline, nephrology signed off, monitor closely as on low-dose Lasix.    Acute diastolic CHF/severe aortic stenosis -With severe aortic stenosis, new finding, cardiology has been consulted.  He is currently being planned for TAVR work-up, discussed  with cardiology, patient went for right/left cardiac cath this morning, cardiology will continue to pursue TAVR work-up as an outpatient. -Volume status appears to be improving despite with stable renal function, discussed with cardiology, recommendation for low-dose Lasix and uptitrate as tolerated, he is currently on schedule 20 mg p.o. daily.  Type 2 diabetes on insulin a1c of 9.3 -CBG improved on current regimen of Levemir 15 units daily, 10 nightly, 6 units before meals and sliding scale.   Hyperlipidemia/GERD Continue home regimen  Physical debility PT/OT consulted. Plan for CIR   DVT prophylaxis: Heparin subq Code Status: Full Family Communication: father  at bedside  Status is: Inpatient  Remains inpatient appropriate because:Ongoing diagnostic testing needed not appropriate for outpatient work up and Inpatient level of care appropriate due to severity of illness   Dispo:  Patient From: Home  Planned Disposition: CIR  Expected discharge date: 06/08/2020  Medically stable for discharge: yes, once accepted by CIR       Consultants:   Orthopedic Surgery  cardiology  Procedures:  Transtibial amputation Application of Prevena wound VAC by Dr. Sharol Given on 11/3. -Cardiac cath 11/8  Antimicrobials: Anti-infectives (From admission, onward)   Start     Dose/Rate Route Frequency Ordered Stop   06/03/20 0800  ceFAZolin (ANCEF) IVPB 2g/100 mL premix        2 g 200 mL/hr over 30 Minutes Intravenous On call to O.R. 06/03/20 0701 06/03/20 1644   06/03/20 0600  vancomycin (VANCOREADY) IVPB 1500 mg/300 mL  Status:  Discontinued        1,500 mg 150 mL/hr over 120 Minutes Intravenous Every 36 hours 06/01/20 1826 06/02/20 1130   06/03/20 0000  ceFEPIme (MAXIPIME) 2 g in sodium chloride 0.9 % 100 mL IVPB        2 g 200 mL/hr over 30 Minutes Intravenous Every 12 hours 06/02/20 1408 06/04/20 1330   06/02/20 1500  ceFEPIme (MAXIPIME) 2 g in sodium chloride 0.9 % 100 mL IVPB          2 g 200 mL/hr over 30 Minutes Intravenous  Once 06/02/20 1408 06/02/20 1653   06/02/20 1400  linezolid (ZYVOX) IVPB 600 mg        600 mg 300 mL/hr over 60 Minutes Intravenous Every 12 hours 06/02/20 1122 06/04/20 2226   06/01/20 1845  vancomycin (VANCOCIN) IVPB 1000 mg/200 mL premix  Status:  Discontinued        1,000 mg 200 mL/hr over 60 Minutes Intravenous  Once 06/01/20 1759 06/01/20 1815   06/01/20 1845  vancomycin (VANCOREADY) IVPB 2000 mg/400 mL        2,000 mg 200 mL/hr over 120 Minutes Intravenous  Once 06/01/20 1816 06/01/20 2042   06/01/20 1045  piperacillin-tazobactam (ZOSYN) IVPB 3.375 g        3.375 g 100 mL/hr over 30 Minutes Intravenous  Once 06/01/20 1043 06/01/20 1237        Objective: Vitals:   06/08/20 2105 06/09/20 0009 06/09/20 0100 06/09/20 0603  BP: 129/66 125/71  123/65  Pulse:  77  78  Resp: (!) 22 20  18   Temp: 98.5 F (36.9 C) 99.5 F (37.5 C)  99.4 F (37.4 C)  TempSrc: Oral Oral  Oral  SpO2: 99% 99%  97%  Weight:   110.6 kg   Height:        Intake/Output Summary (Last 24 hours) at 06/09/2020 1044 Last data filed at 06/09/2020 0830 Gross per 24 hour  Intake 1045 ml  Output 1150 ml  Net -105 ml   Filed Weights   06/03/20 1452 06/08/20 0412 06/09/20 0100  Weight: 111.5 kg 106.4 kg 110.6 kg    Examination:  Awake Alert, Oriented X 3, No new F.N deficits, Normal affect Symmetrical Chest wall movement, Good air movement bilaterally, CTAB RRR,No Gallops,Rubs ,+ Murmurs, No Parasternal Heave +ve B.Sounds, Abd Soft, No tenderness, No rebound - guarding or rigidity. No Cyanosis, right lower extremity with trace edema, left status post BKA, on wound VAC.  Data Reviewed: I have personally reviewed following labs and imaging studies  CBC: Recent Labs  Lab 06/03/20 0321 06/03/20 0321 06/04/20 5732 06/04/20 2025 06/05/20 0253 06/05/20 0253 06/06/20 0510 06/08/20 0324 06/08/20 1147 06/08/20 1154 06/09/20 0545  WBC 4.7   < > 5.9  --   5.3  --  4.4 4.2  --   --  4.6  NEUTROABS 3.5  --   --   --   --   --   --   --   --   --   --   HGB 8.8*   < > 9.8*   < > 9.2*   < > 9.1* 8.6* 8.8* 8.8* 8.5*  HCT 28.1*   < > 31.1*   < > 28.5*   < > 29.1* 27.2* 26.0* 26.0* 27.4*  MCV 82.2   < > 81.2  --  80.5  --  79.9* 82.2  --   --  81.5  PLT 150   < > 166  --  162  --  150 142*  --   --  136*   < > = values in this interval not displayed.   Basic  Metabolic Panel: Recent Labs  Lab 06/03/20 0321 06/04/20 0277 06/05/20 0253 06/05/20 0253 06/06/20 0510 06/06/20 0510 06/07/20 0239 06/08/20 0324 06/08/20 1147 06/08/20 1154 06/09/20 0545  NA 132*   < > 132*   < > 136   < > 137 136 140 139 138  K 4.2   < > 4.4   < > 5.0   < > 4.4 4.3 4.3 4.3 4.3  CL 102   < > 102  --  108  --  108 107  --   --  107  CO2 20*   < > 19*  --  20*  --  22 21*  --   --  23  GLUCOSE 354*   < > 160*  --  232*  --  218* 254*  --   --  162*  BUN 41*   < > 47*  --  43*  --  37* 34*  --   --  30*  CREATININE 2.22*   < > 2.49*  --  1.98*  --  1.69* 1.62*  --   --  1.70*  CALCIUM 8.3*   < > 8.8*  --  8.7*  --  8.3* 8.3*  --   --  8.4*  MG 1.7  --   --   --   --   --   --   --   --   --   --    < > = values in this interval not displayed.   GFR: Estimated Creatinine Clearance: 57 mL/min (A) (by C-G formula based on SCr of 1.7 mg/dL (H)). Liver Function Tests: Recent Labs  Lab 06/03/20 0321  AST 15  ALT 26  ALKPHOS 113  BILITOT 1.0  PROT 6.2*  ALBUMIN 3.0*   No results for input(s): LIPASE, AMYLASE in the last 168 hours. No results for input(s): AMMONIA in the last 168 hours. Coagulation Profile: No results for input(s): INR, PROTIME in the last 168 hours. Cardiac Enzymes: No results for input(s): CKTOTAL, CKMB, CKMBINDEX, TROPONINI in the last 168 hours. BNP (last 3 results) No results for input(s): PROBNP in the last 8760 hours. HbA1C: No results for input(s): HGBA1C in the last 72 hours. CBG: Recent Labs  Lab 06/08/20 1131 06/08/20 1417  06/08/20 1621 06/08/20 2107 06/09/20 0557  GLUCAP 238* 188* 138* 187* 147*   Lipid Profile: No results for input(s): CHOL, HDL, LDLCALC, TRIG, CHOLHDL, LDLDIRECT in the last 72 hours. Thyroid Function Tests: No results for input(s): TSH, T4TOTAL, FREET4, T3FREE, THYROIDAB in the last 72 hours. Anemia Panel: No results for input(s): VITAMINB12, FOLATE, FERRITIN, TIBC, IRON, RETICCTPCT in the last 72 hours. Sepsis Labs: No results for input(s): PROCALCITON, LATICACIDVEN in the last 168 hours.  Recent Results (from the past 240 hour(s))  Respiratory Panel by RT PCR (Flu A&B, Covid) - Nasopharyngeal Swab     Status: None   Collection Time: 06/01/20 11:02 AM   Specimen: Nasopharyngeal Swab  Result Value Ref Range Status   SARS Coronavirus 2 by RT PCR NEGATIVE NEGATIVE Final    Comment: (NOTE) SARS-CoV-2 target nucleic acids are NOT DETECTED.  The SARS-CoV-2 RNA is generally detectable in upper respiratoy specimens during the acute phase of infection. The lowest concentration of SARS-CoV-2 viral copies this assay can detect is 131 copies/mL. A negative result does not preclude SARS-Cov-2 infection and should not be used as the sole basis for treatment or other patient management decisions. A negative result may occur with  improper specimen collection/handling, submission of specimen other than nasopharyngeal swab, presence of viral mutation(s) within the areas targeted by this assay, and inadequate number of viral copies (<131 copies/mL). A negative result must be combined with clinical observations, patient history, and epidemiological information. The expected result is Negative.  Fact Sheet for Patients:  PinkCheek.be  Fact Sheet for Healthcare Providers:  GravelBags.it  This test is no t yet approved or cleared by the [place] FDA and  has been authorized for detection and/or diagnosis of SARS-CoV-2 by FDA under  an Emergency Use Authorization (EUA). This EUA will remain  in effect (meaning this test can be used) for the duration of the COVID-19 declaration under Section 564(b)(1) of the Act, 21 U.S.C. section 360bbb-3(b)(1), unless the authorization is terminated or revoked sooner.     Influenza A by PCR NEGATIVE NEGATIVE Final   Influenza B by PCR NEGATIVE NEGATIVE Final    Comment: (NOTE) The Xpert Xpress SARS-CoV-2/FLU/RSV assay is intended as an aid in  the diagnosis of influenza from Nasopharyngeal swab specimens and  should not be used as a sole basis for treatment. Nasal washings and  aspirates are unacceptable for Xpert Xpress SARS-CoV-2/FLU/RSV  testing.  Fact Sheet for Patients: PinkCheek.be  Fact Sheet for Healthcare Providers: GravelBags.it  This test is not yet approved or cleared by the [place] FDA and  has been authorized for detection and/or diagnosis of SARS-CoV-2 by  FDA under an Emergency Use Authorization (EUA). This EUA will remain  in effect (meaning this test can be used) for the duration of the  Covid-19 declaration under Section 564(b)(1) of the Act, 21  U.S.C. section 360bbb-3(b)(1), unless the authorization is  terminated or revoked. Performed at [facility], [place] [address], [place], [place] [address]   Culture, blood (routine x 2)     Status: None   Collection Time: 06/01/20 11:47 AM   Specimen: BLOOD  Result Value Ref Range Status   Specimen Description   Final    BLOOD RIGHT ANTECUBITAL Performed at [facility] [address], [place], [place] [address]    Special Requests   Final    BOTTLES DRAWN AEROBIC AND ANAEROBIC Blood Culture results may not be optimal due to an excessive volume of blood received in culture bottles Performed at [facility] [address] [place], [place] [address]    Culture   Final    NO GROWTH 5  DAYS Performed at [facility], [place] [address], [place], [place] [address]    Report Status 06/06/2020 FINAL  Final  Culture, blood (routine x 2)     Status: None   Collection Time: 06/01/20 11:47 AM   Specimen: BLOOD  Result Value Ref Range Status   Specimen Description   Final    BLOOD LEFT ANTECUBITAL Performed at [facility] [address] [place], [place] [address]    Special Requests   Final    BOTTLES DRAWN AEROBIC AND ANAEROBIC Blood Culture results may not be optimal due to an excessive volume of blood received in culture bottles Performed at [facility] [address] [place], [place] [address]    Culture   Final    NO GROWTH 5 DAYS Performed at [facility], [place] [address], [place], [place] [address]    Report Status 06/06/2020 FINAL  Final  MRSA PCR Screening     Status: None   Collection Time: 06/02/20  7:00 AM   Specimen: Nasopharyngeal  Result Value Ref Range Status   MRSA by PCR NEGATIVE NEGATIVE Final    Comment:        The GeneXpert MRSA Assay (FDA approved for NASAL specimens only), is one component of a comprehensive MRSA colonization surveillance program. It is not intended to diagnose MRSA infection nor to guide or monitor treatment for MRSA infections. Performed at [facility], [place] [address] [place], [place] [address]      Radiology Studies: CARDIAC CATHETERIZATION  Result Date: 06/08/2020 Conclusions: 1. Mild to moderate, non-obstructive coronary artery disease with 30-40% mid LAD stenosis.  No significant disease identified in the LCx or RCA. 2. Moderately to severely elevated left heart, right heart, and pulmonary artery pressures. 3. Normal cardiac output/index. Recommendations: 1. Ongoing workup of severe aortic stenosis per valve team. 2. Maintain net even to slightly negative fluid balance today.  If renal function remains stable, careful escalation of diuresis is  recommended. 3. Medical therapy and risk factor modification to prevent progression of coronary artery disease. Nelva Bush, MD [facility]    Scheduled Meds: . (feeding supplement) PROSource Plus  30 mL Oral TID BM  . atorvastatin  10 mg Oral Daily  . docusate sodium  100 mg Oral BID  . fenofibrate  54 mg Oral Daily  . furosemide  20 mg Oral Daily  . glyBURIDE  5 mg Oral BID WC  . heparin  5,000 Units Subcutaneous Q8H  . insulin aspart  0-15 Units Subcutaneous TID WC  . insulin aspart  6 Units Subcutaneous TID WC  . insulin detemir  10 Units Subcutaneous QHS  . insulin detemir  15 Units Subcutaneous Daily  . mycophenolate  360 mg Oral BID  . pantoprazole  40 mg Oral Daily  . sodium chloride flush  3 mL Intravenous Q12H  . tacrolimus  1 mg Oral BID   Continuous Infusions: . sodium chloride 250 mL (06/01/20 1838)  . sodium chloride       LOS: 8 days   Phillips Climes, MD [facility] Pager On Amion  If 7PM-7AM, please contact night-coverage 06/09/2020, 10:44 AM

## 2020-06-09 NOTE — Progress Notes (Signed)
Occupational Therapy Treatment Patient Details Name: Steven [name] MRN: [id number] DOB: 1955-11-17 Today's Date: 06/09/2020    History of present illness Pt is a 64 y.o. male admitted 06/01/20 with worsening L foot diabetic pressure ulcer. 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 PMH includes obesity, OSA, HTN, DM2, ESRD (s/p renal transplant), HLD.   OT comments  Pt progressing towards acute OT goals. Remains motivated to work with therapy. Focus of session was functional mobility (sidestepped along EOB) and toilet transfer to [facility]. Mod-min A with functional transfers/mobility. D/c plan remains very much appropriate .   Follow Up Recommendations  CIR    Equipment Recommendations  3 in 1 bedside commode;Wheelchair (measurements OT);Wheelchair cushion (measurements OT);Other (comment);Tub/shower bench    Recommendations for Other Services      Precautions / Restrictions Precautions Precautions: Fall Precaution Comments: L BKA wound back; watch SpO2/HR Required Braces or Orthoses: Other Brace Other Brace: limb protector Restrictions Weight Bearing Restrictions: Yes LLE Weight Bearing: Non weight bearing       Mobility Bed Mobility Overal bed mobility: Modified Independent Bed Mobility: Rolling;Sidelying to Sit Rolling: Modified independent (Device/Increase time) Sidelying to sit: Min guard       General bed mobility comments: min guard to powerup from Left sidelying 2/2 L elbow pain.   Transfers Overall transfer level: Needs assistance Equipment used: Rolling walker (2 wheeled) Transfers: Sit to/from [facility] Sit to Stand: Mod assist Stand pivot transfers: Mod assist;Min assist       General transfer comment: mod A on intial stand, min A after, elevated EOB. cues for technique with rw and hand placement.     Balance Overall balance assessment: Needs assistance Sitting-balance support: Feet supported Sitting balance-Leahy Scale: Good      Standing balance support: Bilateral upper extremity supported;During functional activity Standing balance-Leahy Scale: Poor Standing balance comment: Dependent on BUE support, reliant on external assist for balance and safety                           ADL either performed or assessed with clinical judgement   ADL Overall ADL's : Needs assistance/impaired                         Toilet Transfer: Minimal assistance;Moderate assistance;Stand-pivot;RW;BSC Toilet Transfer Details (indicate cue type and reason): mod A on initial stand, min A after         Functional mobility during ADLs: Minimal assistance;Cueing for sequencing;Cueing for safety;Rolling walker General ADL Comments: Pt completed bed mobility, stood EOB and sidestepped along EOB, seated rest break then SPT to [facility]. Mod A on initial stnad, min A after     Vision       Perception     Praxis      Cognition Arousal/Alertness: Awake/alert Behavior During Therapy: WFL for tasks assessed/performed Overall Cognitive Status: Within Functional Limits for tasks assessed                                          Exercises     Shoulder Instructions       General Comments      Pertinent Vitals/ Pain       Pain Assessment: Faces Faces Pain Scale: Hurts a little bit Pain Location: L elbow Pain Descriptors / Indicators: Sore Pain Intervention(s): Monitored during session  Home Living  Prior Functioning/Environment              Frequency  Min 2X/week        Progress Toward Goals  OT Goals(current goals can now be found in the care plan section)  Progress towards OT goals: Progressing toward goals  Acute Rehab OT Goals Patient Stated Goal: to be able to be active again to watch son play competitive baseball OT Goal Formulation: With patient Time For Goal Achievement: 06/18/20 Potential to Achieve Goals:  Good ADL Goals Pt Will Perform Grooming: with modified independence;standing Pt Will Perform Lower Body Bathing: with modified independence;sitting/lateral leans Pt Will Perform Lower Body Dressing: with min assist;sitting/lateral leans Pt Will Transfer to Toilet: with supervision;ambulating Pt Will Perform Toileting - Clothing Manipulation and hygiene: sitting/lateral leans;with set-up Additional ADL Goal #1: Pt will perform bed mobility at mod I level prior to engaging in ADL  Plan Discharge plan remains appropriate    Co-evaluation                 AM-PAC OT "6 Clicks" Daily Activity     Outcome Measure   Help from another person eating meals?: None Help from another person taking care of personal grooming?: A Little Help from another person toileting, which includes using toliet, bedpan, or urinal?: A Lot Help from another person bathing (including washing, rinsing, drying)?: A Lot Help from another person to put on and taking off regular upper body clothing?: A Little Help from another person to put on and taking off regular lower body clothing?: A Lot 6 Click Score: 16    End of Session Equipment Utilized During Treatment: Gait belt;Rolling walker  OT Visit Diagnosis: Unsteadiness on feet (R26.81);Other abnormalities of gait and mobility (R26.89);Pain;Muscle weakness (generalized) (M62.81)   Activity Tolerance Patient tolerated treatment well   Patient Left with call bell/phone within reach;Other (comment) (on [facility])   Nurse Communication Other (comment) (NT: pt on 3n1, no recliner in room and would benefit from on)        Time: 5929-2446 OT Time Calculation (min): 22 min  Charges: OT General Charges $OT Visit: 1 Visit OT Treatments $Self Care/Home Management : 8-22 mins  Tyrone Schimke, OT Acute Rehabilitation Services Pager: [phone] Office: [phone]   Hortencia Pilar 06/09/2020, 9:28 AM

## 2020-06-09 NOTE — H&P (Signed)
Physical Medicine and Rehabilitation Admission H&P    Chief Complaint  Patient presents with  . Functional deficits due to L-BKA    HPI: Steven [name] is a 64 year old male with history of T2DM, OSA, HTN, renal transplant 2006 who was admitted on 06/01/2020 with worsening left foot ulcer, despite abx. History taken from chart review and patient. 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 Sharol Given.  On 06/04/2020 he developed hypoxia and dyspnea as well as chest pressure due to pulmonary edema. He was treated with IV diuresis. 2D echocardiogram showed severely calcified aortic and mitral valves with severe aortic stenosis, ejection fraction of 60-65% with moderate concentric left ventricular hypertrophy.  Dr. Burt Knack consulted for input on severe AS and patient underwent cardiac cath for evaluation of potential TVAR. 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 Rest of TVAR work up to be done on outpatient basis and recommendations are to carefully escalate diuresis if renal function remains stable.   Blood sugars have been poorly controlled. Acute on chronic renal failure improving with BUN/SCr 47/2.49-->30/1.7. Acute blood loss anemia stable. Platelets trending down. Wound VAC was DC'd. Therapy ongoing and patient limited by left elbow pain, weakness as well as SOB with activity. CIR recommended due to functional decline. Please see preadmission assessment from earlier today as well.  Review of Systems  Constitutional: Negative for fever and malaise/fatigue.  HENT: Negative for hearing loss and tinnitus.   Eyes: Negative for blurred vision and double vision.  Respiratory: Negative for cough and shortness of breath.   Cardiovascular: Negative for chest pain and palpitations.  Gastrointestinal: Negative for constipation, diarrhea, nausea  and vomiting.  Genitourinary: Negative for dysuria and urgency.  Musculoskeletal: Positive for joint pain (left elbow pain and limitation due to tendinitis flarepa) and myalgias.  Skin: Negative for rash.  Neurological: Positive for weakness. Negative for sensory change, speech change and focal weakness.  Psychiatric/Behavioral: The patient is not nervous/anxious and does not have insomnia.   All other systems reviewed and are negative.   Past Medical History:  Diagnosis Date  . Cataract    removed by surgery  . Diabetes ([place])    type 2  . Diabetic glomerulosclerosis [facility])    [facility] 03/21/12 by Dr. Corliss Parish.  . GERD (gastroesophageal reflux disease)   . HLD (hyperlipidemia)   . Hyperparathyroidism ([place])   . Hypertension    Patient denies this DX, no meds  . Kidney transplant recipient   . Obesity   . Sleep apnea    uses CPAP nightly  . Venous insufficiency   . Vitamin D deficiency     Past Surgical History:  Procedure Laterality Date  . AMPUTATION Right 07/19/2017   Procedure: AMPUTATION RIGHT 2ND TOE;  Surgeon: Leandrew Koyanagi, MD;  Location: [place];  Service: Orthopedics;  Laterality: Right;  . AMPUTATION Left 06/03/2020   Procedure: LEFT BELOW KNEE AMPUTATION;  Surgeon: Newt Minion, MD;  Location: [place];  Service: Orthopedics;  Laterality: Left;  . COLONOSCOPY     greater than 10 yrs ago  . EYE SURGERY Bilateral    cataracts removed  . KIDNEY TRANSPLANT  2006  . NEPHRECTOMY TRANSPLANTED ORGAN  2006  . RIGHT HEART CATH AND CORONARY ANGIOGRAPHY N/A 06/08/2020   Procedure: RIGHT HEART CATH AND CORONARY ANGIOGRAPHY;  Surgeon: End,  Harrell Gave, MD;  Location: [place] [facility];  Service: Cardiovascular;  Laterality: N/A;  . UPPER GASTROINTESTINAL ENDOSCOPY    . WISDOM TOOTH EXTRACTION      Family History  Problem Relation Age of Onset  . Healthy Mother   . Healthy Father   . Colon cancer Neg Hx   . Rectal cancer Neg Hx   . Stomach  cancer Neg Hx    Social History: Married but separated.  Active--use cane for ambulation. Retired Academic librarian and still Science writer.  Reports that he has never smoked. He quit smokeless tobacco use about 22 months ago.  His smokeless tobacco use included chew. He reports that he does not drink alcohol and does not use drugs.   Allergies  Allergen Reactions  . Sulfa Antibiotics Rash   Medications Prior to Admission  Medication Sig Dispense Refill  . acetaminophen (TYLENOL) 500 MG tablet Take 500 mg by mouth every 6 (six) hours as needed for mild pain or fever.    Marland Kitchen atorvastatin (LIPITOR) 10 MG tablet Take 10 mg by mouth daily.     Marland Kitchen doxycycline (VIBRAMYCIN) 100 MG capsule Take 1 capsule (100 mg total) by mouth 2 (two) times daily. 14 capsule 0  . fenofibrate (TRICOR) 48 MG tablet Take 48 mg by mouth daily.    . furosemide (LASIX) 40 MG tablet Take 40 mg by mouth daily as needed for fluid (swelling).   1  . glyBURIDE (DIABETA) 5 MG tablet Take 5 mg by mouth 2 (two) times daily with a meal.     . insulin aspart (NOVOLOG) 100 UNIT/ML injection Inject 0-30 Units into the skin 3 (three) times daily as needed for high blood sugar. Sliding scale     . insulin detemir (LEVEMIR) 100 UNIT/ML FlexPen Inject 10 Units into the skin daily. 15 mL 1  . mycophenolate (MYFORTIC) 360 MG TBEC Take 360 mg by mouth 2 (two) times daily.    Marland Kitchen omeprazole (PRILOSEC) 20 MG capsule Take 1 capsule (20 mg total) by mouth daily. 90 capsule 3  . oxyCODONE (OXY IR/ROXICODONE) 5 MG immediate release tablet Take 1 tablet (5 mg total) by mouth every 4 (four) hours as needed for severe pain. 20 tablet 0  . tacrolimus (PROGRAF) 1 MG capsule Take 1 mg by mouth 2 (two) times daily.     . Vitamin D, Ergocalciferol, (DRISDOL) 1.25 MG (50000 UNIT) CAPS capsule TAKE 1 CAPSULE (50,000 UNITS TOTAL) BY MOUTH EVERY 7 (SEVEN) DAYS FOR 12 DOSES. (Patient taking differently: Take 50,000 Units by mouth every 7 (seven)  days. Wednesday) 12 capsule 0  . cephALEXin (KEFLEX) 500 MG capsule Take 1 capsule (500 mg total) by mouth 4 (four) times daily. (Patient not taking: Reported on 06/01/2020) 20 capsule 0  . Insulin Pen Needle (BD PEN NEEDLE NANO 2ND GEN) 32G X 4 MM MISC Use daily for glucose control 100 each 3  . Insulin Pen Needle 32G X 4 MM MISC Use as directed with insulin pen 100 each 1  . ONETOUCH VERIO test strip 1 each by Other route 3 (three) times daily.      Drug Regimen Review  Drug regimen was reviewed and remains appropriate with no significant issues identified  Home: Home Living Family/patient expects to be discharged to:: Private residence Living Arrangements: Alone Available Help at Discharge: Family, Available 24 hours/day Type of Home: House Home Access: Stairs to enter [employer] of Steps: 1 threshold (1.5" high) Entrance Stairs-Rails: None Home Layout: One  level Bathroom Shower/Tub: Multimedia programmer: Handicapped height Bathroom Accessibility: Yes Home Equipment: Grab bars - toilet, Grab bars - tub/shower   Functional History: Prior Function Level of Independence: Independent Comments: Very active, teaches paramedic classes. Likes hunting, going to ball games. Retired Print production planner Status:  Mobility: Bed Mobility Overal bed mobility: Modified Independent Bed Mobility: Rolling, Sidelying to Sit Rolling: Modified independent (Device/Increase time) Sidelying to sit: Min guard Supine to sit: Modified independent (Device/Increase time) General bed mobility comments: min guard to powerup from Left sidelying 2/2 L elbow pain.  Transfers Overall transfer level: Needs assistance Equipment used: Rolling walker (2 wheeled) Transfers: Sit to/from Stand, [employer] Transfers Sit to Stand: Mod assist Stand pivot transfers: Mod assist, Min assist General transfer comment: mod A on intial stand, min A after, elevated EOB. cues for technique with  rw and hand placement.  Ambulation/Gait Ambulation/Gait assistance: Mod assist, Min assist Gait Distance (Feet): 10 Feet Assistive device: Rolling walker (2 wheeled) Gait Pattern/deviations: Step-to pattern, Decreased stride length General Gait Details: Slow, fatigued gait with RW; heavy reliance on BUE support to hop on RLE; instability requiring min-modA to prevent posterior LOB; increased RR with activity and SOB with activity limiting progression. He requested to use [facility] prior to his procedure this morning, therefore session limited. Gait velocity: Decreased    ADL: ADL Overall ADL's : Needs assistance/impaired Eating/Feeding: Independent Grooming: Minimal assistance, Standing Grooming Details (indicate cue type and reason): sink Upper Body Bathing: Set up, Sitting Lower Body Bathing: Moderate assistance, Sit to/from stand, Sitting/lateral leans Lower Body Bathing Details (indicate cue type and reason): able to perform figure 4 Upper Body Dressing : Set up, Sitting Lower Body Dressing: Moderate assistance, Sitting/lateral leans, Sit to/from stand Toilet Transfer: Minimal assistance, Moderate assistance, Stand-pivot, RW, BSC Toilet Transfer Details (indicate cue type and reason): mod A on initial stand, min A after Toileting- Clothing Manipulation and Hygiene: Moderate assistance, Sit to/from stand, Sitting/lateral lean Functional mobility during ADLs: Minimal assistance, Cueing for sequencing, Cueing for safety, Rolling walker General ADL Comments: Pt completed bed mobility, stood EOB and sidestepped along EOB, seated rest break then SPT to [facility]. Mod A on initial stnad, min A after  Cognition: Cognition Overall Cognitive Status: Within Functional Limits for tasks assessed Orientation Level: Oriented X4 Cognition Arousal/Alertness: Awake/alert Behavior During Therapy: WFL for tasks assessed/performed Overall Cognitive Status: Within Functional Limits for tasks assessed   Blood  pressure (!) 105/57, pulse 81, temperature 98.5 F (36.9 C), temperature source Oral, resp. rate 20, height 5' 10.98" (1.803 m), weight 109.4 kg, SpO2 100 %. Physical Exam Vitals reviewed.  Constitutional:      Appearance: He is obese.  HENT:     Head: Normocephalic and atraumatic.     Right Ear: External ear normal.     Left Ear: External ear normal.     Nose: Nose normal.  Eyes:     General:        Right eye: No discharge.        Left eye: No discharge.     Extraocular Movements: Extraocular movements intact.  Cardiovascular:     Rate and Rhythm: Normal rate and regular rhythm.     Heart sounds: Murmur heard.   Pulmonary:     Effort: Pulmonary effort is normal. No respiratory distress.     Breath sounds: No stridor.  Abdominal:     General: Abdomen is flat. Bowel sounds are normal. There is no distension.  Musculoskeletal:  Cervical back: Normal range of motion and neck supple.     Comments: Left BKA with edema and tenderness Intrinsic atrophy. Left heel ulcer with central clearing with dry eschar around the edges.  Left elbow boggy with mild edema left proximal forearm with limited ROM due to pain.   Skin:    Comments: Left BKA with dressing CDI Vascular changes to left lower extremity Edema left lower extremity  Neurological:     Mental Status: He is alert.     Comments: Motor: Bilateral upper extremities: 5/5 proximal distal Right lower extremity: 5/5 proximal distally left lower extremity: Hip flexion, knee extension 4+/5 (pain inhibition) Sensation intact to light touch right lower extremity  Psychiatric:        Mood and Affect: Mood normal.        Behavior: Behavior normal.        Thought Content: Thought content normal.     Results for orders placed or performed during the hospital encounter of 06/01/20 (from the past 48 hour(s))  Glucose, capillary     Status: Abnormal   Collection Time: 06/08/20  2:17 PM  Result Value Ref Range   Glucose-Capillary 188  (H) 70 - 99 mg/dL    Comment: Glucose reference range applies only to samples taken after fasting for at least 8 hours.   Comment 1 Notify RN    Comment 2 Document in Chart   Glucose, capillary     Status: Abnormal   Collection Time: 06/08/20  4:21 PM  Result Value Ref Range   Glucose-Capillary 138 (H) 70 - 99 mg/dL    Comment: Glucose reference range applies only to samples taken after fasting for at least 8 hours.  Glucose, capillary     Status: Abnormal   Collection Time: 06/08/20  9:07 PM  Result Value Ref Range   Glucose-Capillary 187 (H) 70 - 99 mg/dL    Comment: Glucose reference range applies only to samples taken after fasting for at least 8 hours.  Basic metabolic panel     Status: Abnormal   Collection Time: 06/09/20  5:45 AM  Result Value Ref Range   Sodium 138 135 - 145 mmol/L   Potassium 4.3 3.5 - 5.1 mmol/L   Chloride 107 98 - 111 mmol/L   CO2 23 22 - 32 mmol/L   Glucose, Bld 162 (H) 70 - 99 mg/dL    Comment: Glucose reference range applies only to samples taken after fasting for at least 8 hours.   BUN 30 (H) 8 - 23 mg/dL   Creatinine, Ser 1.70 (H) 0.61 - 1.24 mg/dL   Calcium 8.4 (L) 8.9 - 10.3 mg/dL   GFR, Estimated 45 (L) >60 mL/min    Comment: (NOTE) Calculated using the CKD-EPI Creatinine Equation (2021)    Anion gap 8 5 - 15    Comment: Performed at [facility] [address], [place], [place] [address]  CBC     Status: Abnormal   Collection Time: 06/09/20  5:45 AM  Result Value Ref Range   WBC 4.6 4.0 - 10.5 K/uL   RBC 3.36 (L) 4.22 - 5.81 MIL/uL   Hemoglobin 8.5 (L) 13.0 - 17.0 g/dL   HCT 27.4 (L) 39 - 52 %   MCV 81.5 80.0 - 100.0 fL   MCH 25.3 (L) 26.0 - 34.0 pg   MCHC 31.0 30.0 - 36.0 g/dL   RDW 14.6 11.5 - 15.5 %   Platelets 136 (L) 150 - 400 K/uL  nRBC 0.0 0.0 - 0.2 %    Comment: Performed at [facility], [place] [address], [place], [place] [address]  Glucose, capillary     Status: Abnormal   Collection Time: 06/09/20  5:57 AM    Result Value Ref Range   Glucose-Capillary 147 (H) 70 - 99 mg/dL    Comment: Glucose reference range applies only to samples taken after fasting for at least 8 hours.  Glucose, capillary     Status: Abnormal   Collection Time: 06/09/20 11:28 AM  Result Value Ref Range   Glucose-Capillary 119 (H) 70 - 99 mg/dL    Comment: Glucose reference range applies only to samples taken after fasting for at least 8 hours.  Glucose, capillary     Status: Abnormal   Collection Time: 06/09/20  4:16 PM  Result Value Ref Range   Glucose-Capillary 103 (H) 70 - 99 mg/dL    Comment: Glucose reference range applies only to samples taken after fasting for at least 8 hours.  Glucose, capillary     Status: Abnormal   Collection Time: 06/09/20  9:54 PM  Result Value Ref Range   Glucose-Capillary 105 (H) 70 - 99 mg/dL    Comment: Glucose reference range applies only to samples taken after fasting for at least 8 hours.  Glucose, capillary     Status: Abnormal   Collection Time: 06/10/20  6:27 AM  Result Value Ref Range   Glucose-Capillary 105 (H) 70 - 99 mg/dL    Comment: Glucose reference range applies only to samples taken after fasting for at least 8 hours.  Glucose, capillary     Status: Abnormal   Collection Time: 06/10/20 11:14 AM  Result Value Ref Range   Glucose-Capillary 190 (H) 70 - 99 mg/dL    Comment: Glucose reference range applies only to samples taken after fasting for at least 8 hours.   No results found.     Medical Problem List and Plan: 1. Deficits with mobility, transfers, endurance, self-care secondary to left BKA secondary to osteomyelitis.  -patient may shower with stump covered  -ELOS/Goals: 10-15 days/supervision/mod I  Admit to CIR 2.  Antithrombotics: -DVT/anticoagulation:  Pharmaceutical: Lovenox  -antiplatelet therapy: N/A 3. Pain Management: Oxycodone prn   Monitor postoperative pain with increased exertion  Monitor for phantom limb pain 4. Mood: Team  support  -antipsychotic agents: N/A 5. Neuropsych: This patient is capable of making decisions on his own behalf. 6. Skin/Wound Care: Monitor wound for healing.    Continue protein supplements. 7. Fluids/Electrolytes/Nutrition: Monitor intake/output.  Heart healthy diet.  CMP ordered for tomorrow. 8.  T2DM: Monitor BS ac/hs. Continue glyburide 1 mg bid with Levemir 10 units qhs and 15 in am. Novolog 6 TID  Monitor with increased exertion. 9. Acute on chronic anemia:   Hb 10-->8.5 on 11/9, CBC ordered for tomorrow 10. Thrombocytopenia  CBC ordered for tomorrow 11. Renal transplant/CKD IIIb:  Followed by Dr. [name].   CMP ordered for tomorrow 12. Aortic stenosis/fluid overload: Continue Lasix. Monitor weights and for signs of overload.  13. Left elbow tendinitis/bursitis: Ice, compression, pressure relief. 14. Right heel ulcer: Foam dressing per Dr. Sharol Given. Will order [facility] for pressure relief measures also.    Bary Leriche, PA-C 06/10/2020  I have personally performed a face to face diagnostic evaluation, including, but not limited to relevant history and physical exam findings, of this patient and developed relevant assessment and plan.  Additionally, I have reviewed and concur with the physician assistant's documentation  above.  Delice Lesch, MD, ABPMR

## 2020-06-10 ENCOUNTER — Inpatient Hospital Stay (HOSPITAL_COMMUNITY)
Admission: RE | Admit: 2020-06-10 | Discharge: 2020-06-24 | DRG: 560 | Disposition: A | Discharge status: Home / Self Care | Payer: BC Managed Care – PPO | Source: Intra-hospital | Attending: Physical Medicine & Rehabilitation | Admitting: Physical Medicine & Rehabilitation

## 2020-06-10 ENCOUNTER — Encounter (HOSPITAL_COMMUNITY): Payer: Self-pay | Admitting: Physical Medicine & Rehabilitation

## 2020-06-10 ENCOUNTER — Other Ambulatory Visit: Payer: Self-pay

## 2020-06-10 DIAGNOSIS — Z9841 Cataract extraction status, right eye: Secondary | ICD-10-CM | POA: Diagnosis not present

## 2020-06-10 DIAGNOSIS — Y830 Surgical operation with transplant of whole organ as the cause of abnormal reaction of the patient, or of later complication, without mention of misadventure at the time of the procedure: Secondary | ICD-10-CM | POA: Diagnosis present

## 2020-06-10 DIAGNOSIS — N1832 Chronic kidney disease, stage 3b: Secondary | ICD-10-CM | POA: Diagnosis present

## 2020-06-10 DIAGNOSIS — Z794 Long term (current) use of insulin: Secondary | ICD-10-CM | POA: Diagnosis not present

## 2020-06-10 DIAGNOSIS — E669 Obesity, unspecified: Secondary | ICD-10-CM | POA: Diagnosis present

## 2020-06-10 DIAGNOSIS — Z87891 Personal history of nicotine dependence: Secondary | ICD-10-CM | POA: Diagnosis not present

## 2020-06-10 DIAGNOSIS — D696 Thrombocytopenia, unspecified: Secondary | ICD-10-CM

## 2020-06-10 DIAGNOSIS — I2510 Atherosclerotic heart disease of native coronary artery without angina pectoris: Secondary | ICD-10-CM | POA: Diagnosis present

## 2020-06-10 DIAGNOSIS — Z9842 Cataract extraction status, left eye: Secondary | ICD-10-CM | POA: Diagnosis not present

## 2020-06-10 DIAGNOSIS — E1122 Type 2 diabetes mellitus with diabetic chronic kidney disease: Secondary | ICD-10-CM | POA: Diagnosis present

## 2020-06-10 DIAGNOSIS — E119 Type 2 diabetes mellitus without complications: Secondary | ICD-10-CM | POA: Diagnosis not present

## 2020-06-10 DIAGNOSIS — L97419 Non-pressure chronic ulcer of right heel and midfoot with unspecified severity: Secondary | ICD-10-CM | POA: Diagnosis present

## 2020-06-10 DIAGNOSIS — I350 Nonrheumatic aortic (valve) stenosis: Secondary | ICD-10-CM | POA: Diagnosis present

## 2020-06-10 DIAGNOSIS — T8619 Other complication of kidney transplant: Secondary | ICD-10-CM | POA: Diagnosis present

## 2020-06-10 DIAGNOSIS — K219 Gastro-esophageal reflux disease without esophagitis: Secondary | ICD-10-CM | POA: Diagnosis present

## 2020-06-10 DIAGNOSIS — Z79899 Other long term (current) drug therapy: Secondary | ICD-10-CM | POA: Diagnosis not present

## 2020-06-10 DIAGNOSIS — Z89421 Acquired absence of other right toe(s): Secondary | ICD-10-CM | POA: Diagnosis not present

## 2020-06-10 DIAGNOSIS — E785 Hyperlipidemia, unspecified: Secondary | ICD-10-CM | POA: Diagnosis present

## 2020-06-10 DIAGNOSIS — M7032 Other bursitis of elbow, left elbow: Secondary | ICD-10-CM | POA: Diagnosis present

## 2020-06-10 DIAGNOSIS — Z4781 Encounter for orthopedic aftercare following surgical amputation: Secondary | ICD-10-CM | POA: Diagnosis present

## 2020-06-10 DIAGNOSIS — Z940 Kidney transplant status: Secondary | ICD-10-CM | POA: Diagnosis not present

## 2020-06-10 DIAGNOSIS — E8770 Fluid overload, unspecified: Secondary | ICD-10-CM | POA: Diagnosis present

## 2020-06-10 DIAGNOSIS — I129 Hypertensive chronic kidney disease with stage 1 through stage 4 chronic kidney disease, or unspecified chronic kidney disease: Secondary | ICD-10-CM | POA: Diagnosis present

## 2020-06-10 DIAGNOSIS — D62 Acute posthemorrhagic anemia: Secondary | ICD-10-CM | POA: Diagnosis present

## 2020-06-10 DIAGNOSIS — N179 Acute kidney failure, unspecified: Secondary | ICD-10-CM | POA: Diagnosis present

## 2020-06-10 DIAGNOSIS — M869 Osteomyelitis, unspecified: Secondary | ICD-10-CM | POA: Diagnosis not present

## 2020-06-10 DIAGNOSIS — Z6831 Body mass index (BMI) 31.0-31.9, adult: Secondary | ICD-10-CM | POA: Diagnosis not present

## 2020-06-10 DIAGNOSIS — M778 Other enthesopathies, not elsewhere classified: Secondary | ICD-10-CM | POA: Diagnosis present

## 2020-06-10 DIAGNOSIS — D631 Anemia in chronic kidney disease: Secondary | ICD-10-CM | POA: Diagnosis present

## 2020-06-10 DIAGNOSIS — Z89512 Acquired absence of left leg below knee: Principal | ICD-10-CM

## 2020-06-10 DIAGNOSIS — E1142 Type 2 diabetes mellitus with diabetic polyneuropathy: Secondary | ICD-10-CM | POA: Diagnosis present

## 2020-06-10 DIAGNOSIS — E559 Vitamin D deficiency, unspecified: Secondary | ICD-10-CM

## 2020-06-10 DIAGNOSIS — E1151 Type 2 diabetes mellitus with diabetic peripheral angiopathy without gangrene: Secondary | ICD-10-CM | POA: Diagnosis present

## 2020-06-10 DIAGNOSIS — I739 Peripheral vascular disease, unspecified: Secondary | ICD-10-CM | POA: Diagnosis not present

## 2020-06-10 DIAGNOSIS — Z882 Allergy status to sulfonamides status: Secondary | ICD-10-CM

## 2020-06-10 DIAGNOSIS — K5900 Constipation, unspecified: Secondary | ICD-10-CM | POA: Diagnosis not present

## 2020-06-10 DIAGNOSIS — E1169 Type 2 diabetes mellitus with other specified complication: Secondary | ICD-10-CM | POA: Diagnosis not present

## 2020-06-10 DIAGNOSIS — S88112A Complete traumatic amputation at level between knee and ankle, left lower leg, initial encounter: Secondary | ICD-10-CM | POA: Diagnosis present

## 2020-06-10 DIAGNOSIS — G546 Phantom limb syndrome with pain: Secondary | ICD-10-CM | POA: Diagnosis present

## 2020-06-10 DIAGNOSIS — L89151 Pressure ulcer of sacral region, stage 1: Secondary | ICD-10-CM | POA: Diagnosis present

## 2020-06-10 LAB — GLUCOSE, CAPILLARY
Glucose-Capillary: 105 mg/dL — ABNORMAL HIGH (ref 70–99)
Glucose-Capillary: 190 mg/dL — ABNORMAL HIGH (ref 70–99)
Glucose-Capillary: 99 mg/dL (ref 70–99)
Glucose-Capillary: 70 mg/dL (ref 70–99)

## 2020-06-10 MED ORDER — INSULIN DETEMIR 100 UNIT/ML FLEXPEN: 10.0000 [IU] | PEN_INJECTOR | Freq: Every day | SUBCUTANEOUS | 1 refills | Status: AC

## 2020-06-10 MED ORDER — TACROLIMUS 1 MG PO CAPS
1.0000 mg | ORAL_CAPSULE | Freq: Two times a day (BID) | ORAL | Status: DC
Start: 2020-06-10 — End: 2021-01-14

## 2020-06-10 MED ORDER — OMEPRAZOLE 20 MG PO CPDR: 20.0000 mg | DELAYED_RELEASE_CAPSULE | Freq: Every day | ORAL | 3 refills | Status: DC

## 2020-06-10 MED ORDER — PANTOPRAZOLE SODIUM 40 MG PO TBEC
40.0000 mg | DELAYED_RELEASE_TABLET | Freq: Every day | ORAL | Status: DC
  Administered 2020-06-11 – 2020-06-24 (×14): 40 mg via ORAL
  Filled 2020-06-10 (×14): qty 1

## 2020-06-10 MED ORDER — CYCLOBENZAPRINE HCL 5 MG PO TABS
5.0000 mg | ORAL_TABLET | Freq: Three times a day (TID) | ORAL | Status: DC | PRN
  Administered 2020-06-19: 5 mg via ORAL
  Filled 2020-06-10: qty 1

## 2020-06-10 MED ORDER — POLYETHYLENE GLYCOL 3350 17 G PO PACK: 17.0000 g | PACK | Freq: Every day | ORAL | 0 refills | Status: DC | PRN

## 2020-06-10 MED ORDER — FUROSEMIDE 20 MG PO TABS
20.0000 mg | ORAL_TABLET | Freq: Every day | ORAL | Status: DC
  Administered 2020-06-11 – 2020-06-24 (×14): 20 mg via ORAL
  Filled 2020-06-10 (×14): qty 1

## 2020-06-10 MED ORDER — FUROSEMIDE 20 MG PO TABS
20.0000 mg | ORAL_TABLET | Freq: Every day | ORAL | Status: DC
Start: 2020-06-11 — End: 2020-09-08

## 2020-06-10 MED ORDER — ONETOUCH VERIO VI STRP
1.0000 | ORAL_STRIP | Freq: Three times a day (TID) | 12 refills | Status: DC
Start: 2020-06-10 — End: 2021-01-14

## 2020-06-10 MED ORDER — ATORVASTATIN CALCIUM 10 MG PO TABS
10.0000 mg | ORAL_TABLET | Freq: Every day | ORAL | Status: DC
  Administered 2020-06-11 – 2020-06-24 (×14): 10 mg via ORAL
  Filled 2020-06-10 (×14): qty 1

## 2020-06-10 MED ORDER — MYCOPHENOLATE SODIUM 360 MG PO TBEC
360.0000 mg | DELAYED_RELEASE_TABLET | Freq: Two times a day (BID) | ORAL | Status: DC
Start: 2020-06-10 — End: 2021-01-14

## 2020-06-10 MED ORDER — INSULIN ASPART 100 UNIT/ML ~~LOC~~ SOLN
0.0000 [IU] | Freq: Three times a day (TID) | SUBCUTANEOUS | 11 refills | Status: DC | PRN
Start: 2020-06-10 — End: 2021-01-14

## 2020-06-10 MED ORDER — INSULIN DETEMIR 100 UNIT/ML ~~LOC~~ SOLN
15.0000 [IU] | Freq: Every day | SUBCUTANEOUS | Status: DC
  Administered 2020-06-11 – 2020-06-12 (×2): 15 [IU] via SUBCUTANEOUS
  Filled 2020-06-10 (×2): qty 0.15

## 2020-06-10 MED ORDER — PROCHLORPERAZINE EDISYLATE 10 MG/2ML IJ SOLN: 5.0000 mg | Freq: Four times a day (QID) | INTRAMUSCULAR | Status: DC | PRN

## 2020-06-10 MED ORDER — ALUM & MAG HYDROXIDE-SIMETH 200-200-20 MG/5ML PO SUSP: 30.0000 mL | ORAL | Status: DC | PRN

## 2020-06-10 MED ORDER — MYCOPHENOLATE SODIUM 180 MG PO TBEC
360.0000 mg | DELAYED_RELEASE_TABLET | Freq: Two times a day (BID) | ORAL | Status: DC
  Administered 2020-06-10 – 2020-06-24 (×28): 360 mg via ORAL
  Filled 2020-06-10 (×29): qty 2

## 2020-06-10 MED ORDER — POLYETHYLENE GLYCOL 3350 17 G PO PACK: 17.0000 g | PACK | Freq: Every day | ORAL | Status: DC | PRN

## 2020-06-10 MED ORDER — OXYCODONE HCL 5 MG PO TABS
5.0000 mg | ORAL_TABLET | ORAL | Status: DC | PRN
  Administered 2020-06-15 – 2020-06-16 (×2): 5 mg via ORAL
  Filled 2020-06-10 (×2): qty 1

## 2020-06-10 MED ORDER — ACETAMINOPHEN 500 MG PO TABS: 500.0000 mg | ORAL_TABLET | Freq: Four times a day (QID) | ORAL | 0 refills | Status: DC | PRN

## 2020-06-10 MED ORDER — INSULIN PEN NEEDLE 32G X 4 MM MISC: 1 refills | Status: DC

## 2020-06-10 MED ORDER — INSULIN ASPART 100 UNIT/ML ~~LOC~~ SOLN
6.0000 [IU] | Freq: Three times a day (TID) | SUBCUTANEOUS | Status: DC
  Administered 2020-06-10 – 2020-06-12 (×4): 6 [IU] via SUBCUTANEOUS

## 2020-06-10 MED ORDER — DOCUSATE SODIUM 100 MG PO CAPS
100.0000 mg | ORAL_CAPSULE | Freq: Two times a day (BID) | ORAL | Status: DC
  Administered 2020-06-10 – 2020-06-24 (×27): 100 mg via ORAL
  Filled 2020-06-10 (×28): qty 1

## 2020-06-10 MED ORDER — DIPHENHYDRAMINE HCL 12.5 MG/5ML PO ELIX: 12.5000 mg | ORAL_SOLUTION | Freq: Four times a day (QID) | ORAL | Status: DC | PRN

## 2020-06-10 MED ORDER — ACETAMINOPHEN 325 MG PO TABS
325.0000 mg | ORAL_TABLET | ORAL | Status: DC | PRN
  Administered 2020-06-18: 650 mg via ORAL
  Filled 2020-06-10: qty 2

## 2020-06-10 MED ORDER — PROCHLORPERAZINE 25 MG RE SUPP: 12.5000 mg | Freq: Four times a day (QID) | RECTAL | Status: DC | PRN

## 2020-06-10 MED ORDER — TRAMADOL HCL 50 MG PO TABS
50.0000 mg | ORAL_TABLET | Freq: Four times a day (QID) | ORAL | Status: DC | PRN
  Administered 2020-06-10 – 2020-06-16 (×3): 50 mg via ORAL
  Filled 2020-06-10 (×3): qty 1

## 2020-06-10 MED ORDER — TRAMADOL HCL 50 MG PO TABS: 50.0000 mg | ORAL_TABLET | Freq: Four times a day (QID) | ORAL | Status: DC | PRN

## 2020-06-10 MED ORDER — GLYBURIDE 5 MG PO TABS
5.0000 mg | ORAL_TABLET | Freq: Two times a day (BID) | ORAL | Status: DC
Start: 2020-06-10 — End: 2021-01-14

## 2020-06-10 MED ORDER — FLEET ENEMA 7-19 GM/118ML RE ENEM: 1.0000 | ENEMA | Freq: Once | RECTAL | Status: DC | PRN

## 2020-06-10 MED ORDER — INSULIN DETEMIR 100 UNIT/ML ~~LOC~~ SOLN
10.0000 [IU] | Freq: Every day | SUBCUTANEOUS | Status: DC
  Administered 2020-06-10: 10 [IU] via SUBCUTANEOUS
  Filled 2020-06-10 (×3): qty 0.1

## 2020-06-10 MED ORDER — TACROLIMUS 1 MG PO CAPS
1.0000 mg | ORAL_CAPSULE | Freq: Two times a day (BID) | ORAL | Status: DC
  Administered 2020-06-10 – 2020-06-24 (×28): 1 mg via ORAL
  Filled 2020-06-10 (×28): qty 1

## 2020-06-10 MED ORDER — PROCHLORPERAZINE MALEATE 5 MG PO TABS: 5.0000 mg | ORAL_TABLET | Freq: Four times a day (QID) | ORAL | Status: DC | PRN

## 2020-06-10 MED ORDER — ENOXAPARIN SODIUM 30 MG/0.3ML ~~LOC~~ SOLN
30.0000 mg | SUBCUTANEOUS | Status: DC
  Administered 2020-06-10 – 2020-06-11 (×2): 30 mg via SUBCUTANEOUS
  Filled 2020-06-10 (×2): qty 0.3

## 2020-06-10 MED ORDER — GLYBURIDE 5 MG PO TABS
5.0000 mg | ORAL_TABLET | Freq: Two times a day (BID) | ORAL | Status: DC
  Administered 2020-06-10 – 2020-06-16 (×12): 5 mg via ORAL
  Filled 2020-06-10 (×12): qty 1

## 2020-06-10 MED ORDER — TRAZODONE HCL 50 MG PO TABS
50.0000 mg | ORAL_TABLET | Freq: Every day | ORAL | Status: DC
  Administered 2020-06-10 – 2020-06-23 (×14): 50 mg via ORAL
  Filled 2020-06-10 (×14): qty 1

## 2020-06-10 MED ORDER — FENOFIBRATE 54 MG PO TABS
54.0000 mg | ORAL_TABLET | Freq: Every day | ORAL | Status: DC
  Administered 2020-06-11 – 2020-06-24 (×14): 54 mg via ORAL
  Filled 2020-06-10 (×14): qty 1

## 2020-06-10 MED ORDER — DOCUSATE SODIUM 100 MG PO CAPS
100.0000 mg | ORAL_CAPSULE | Freq: Two times a day (BID) | ORAL | 0 refills | Status: DC
Start: 2020-06-10 — End: 2020-06-23

## 2020-06-10 MED ORDER — INSULIN ASPART 100 UNIT/ML ~~LOC~~ SOLN
0.0000 [IU] | Freq: Every day | SUBCUTANEOUS | Status: DC
  Administered 2020-06-15 – 2020-06-21 (×2): 2 [IU] via SUBCUTANEOUS

## 2020-06-10 MED ORDER — BD PEN NEEDLE NANO 2ND GEN 32G X 4 MM MISC
3 refills | Status: DC
Start: 2020-06-10 — End: 2021-01-14

## 2020-06-10 MED ORDER — FENOFIBRATE 48 MG PO TABS
48.0000 mg | ORAL_TABLET | Freq: Every day | ORAL | Status: DC
Start: 2020-06-10 — End: 2021-01-14

## 2020-06-10 MED ORDER — INSULIN ASPART 100 UNIT/ML ~~LOC~~ SOLN
0.0000 [IU] | Freq: Three times a day (TID) | SUBCUTANEOUS | Status: DC
  Administered 2020-06-11: 2 [IU] via SUBCUTANEOUS
  Administered 2020-06-12: 1 [IU] via SUBCUTANEOUS
  Administered 2020-06-12 – 2020-06-13 (×2): 2 [IU] via SUBCUTANEOUS
  Administered 2020-06-13 (×2): 3 [IU] via SUBCUTANEOUS
  Administered 2020-06-14: 2 [IU] via SUBCUTANEOUS
  Administered 2020-06-14 (×2): 1 [IU] via SUBCUTANEOUS
  Administered 2020-06-15: 5 [IU] via SUBCUTANEOUS
  Administered 2020-06-16 (×3): 1 [IU] via SUBCUTANEOUS
  Administered 2020-06-17: 2 [IU] via SUBCUTANEOUS
  Administered 2020-06-18: 5 [IU] via SUBCUTANEOUS
  Administered 2020-06-19 – 2020-06-20 (×2): 2 [IU] via SUBCUTANEOUS
  Administered 2020-06-20 (×2): 1 [IU] via SUBCUTANEOUS
  Administered 2020-06-21 (×3): 2 [IU] via SUBCUTANEOUS
  Administered 2020-06-22: 3 [IU] via SUBCUTANEOUS
  Administered 2020-06-22 – 2020-06-23 (×2): 1 [IU] via SUBCUTANEOUS

## 2020-06-10 MED ORDER — BISACODYL 10 MG RE SUPP: 10.0000 mg | Freq: Every day | RECTAL | Status: DC | PRN

## 2020-06-10 MED ORDER — OXYCODONE HCL 5 MG PO TABS: 5.0000 mg | ORAL_TABLET | ORAL | 0 refills | Status: DC | PRN

## 2020-06-10 MED ORDER — GUAIFENESIN-DM 100-10 MG/5ML PO SYRP: 5.0000 mL | ORAL_SOLUTION | Freq: Four times a day (QID) | ORAL | Status: DC | PRN

## 2020-06-10 MED ORDER — ATORVASTATIN CALCIUM 10 MG PO TABS
10.0000 mg | ORAL_TABLET | Freq: Every day | ORAL | Status: DC
Start: 2020-06-10 — End: 2021-01-14

## 2020-06-10 MED ORDER — VITAMIN D (ERGOCALCIFEROL) 1.25 MG (50000 UNIT) PO CAPS: 50000.0000 [IU] | ORAL_CAPSULE | ORAL | 0 refills | Status: DC

## 2020-06-10 MED ORDER — POLYETHYLENE GLYCOL 3350 17 G PO PACK
17.0000 g | PACK | Freq: Every day | ORAL | Status: DC | PRN
  Administered 2020-06-13 – 2020-06-18 (×2): 17 g via ORAL
  Filled 2020-06-10 (×2): qty 1

## 2020-06-10 NOTE — Progress Notes (Signed)
Inpatient Rehabilitation Medication Review by a Pharmacist  A complete drug regimen review was completed for this patient to identify any potential clinically significant medication issues.  Clinically significant medication issues were identified:  no  Check AMION for pharmacist assigned to patient if future medication questions/issues arise during this admission.   Time spent performing this drug regimen review (minutes):  [place], [place] Clinical Pharmacist 06/10/2020 5:19 PM

## 2020-06-10 NOTE — Progress Notes (Signed)
Orthopedic Tech Progress Note Patient Details:  Steven [name] 03-17-1956 [id number] Patient has PRAFO BOOT. Was given when patient was on 3E. Patient ID: Steven [name], male   DOB: September 05, 1955, 64 y.o.   MRN: [id number]   Janit Pagan 06/10/2020, 4:50 PM

## 2020-06-10 NOTE — TOC Transition Note (Signed)
Transition of Care [facility]) - CM/SW Discharge Note   Patient Details  Name: Steven [name] MRN: [id number] Date of Birth: 10-02-1955  [facility] CM/SW Contact:  Zenon Mayo, RN Phone Number: 06/10/2020, 1:08 PM   Clinical Narrative:    NCM notified by Claiborne Billings with CIR that they are taking the patient to CIR today.    Final next level of care: IP Rehab Facility Barriers to Discharge: No Barriers Identified   Patient Goals and CMS Choice        Discharge Placement                       Discharge Plan and Services                  DME Agency: NA       HH Arranged: NA          Social Determinants of Health (SDOH) Interventions     Readmission Risk Interventions No flowsheet data found.

## 2020-06-10 NOTE — Progress Notes (Signed)
Report given to 4W, RN all questions answered.

## 2020-06-10 NOTE — Progress Notes (Signed)
Nutrition Follow-up  DOCUMENTATION CODES:   Obesity unspecified  INTERVENTION:   -Continue 30 ml Prosource Plus TID, each supplement provides 100 kcals and 15 grams protein -MVI with minerals daily  NUTRITION DIAGNOSIS:   Increased nutrient needs related to post-op healing, wound healing as evidenced by estimated needs.  Ongoing  GOAL:   Patient will meet greater than or equal to 90% of their needs  Progressing  MONITOR:   PO intake, Supplement acceptance, Labs, Weight trends  REASON FOR ASSESSMENT:   Consult Wound healing  ASSESSMENT:   64 year old male with a medical history of obesity, OSA, HTN, HLD, type 2 DM, and ESRD s/p renal transplant in 2006 on immunosuppressants.  He presented to the ED due to a worsening diabetic pressure ulcer on the bottom of L foot x1-2 weeks.  He is also noted to have R foot poorly healing pressure injury.  11/3- s/p PROCEDURE: Transtibial amputation Application of Prevena wound VAC  Reviewed I/O's: -547 ml x 24 hours and +1.3 L since admission  UOP: 1.3 L x 24 hours  Spoke with pt at bedside, who was pleasant and in good spirits today. He reports that he feels much better after wound vac was removed today. He shares he has fair appetite, but feels he has been eating enough to keep himself satisfied (consumed coffee, cereal, banana, and fruit this morning). Noted meal completion 100%.   PTA pt reports good appetite, consuming 3 meals per day. He shares that he tries to keep his DM under control and has a Therapist, sports through his insurance company who calls him regularly and help with ordering new glucometer, test strips, and supplies. Pt shares that he is on insulin PTA and has no difficulty affording it; he is compliant with his medications. Pt shares that his CBGS are usually in the low 100's, however, have been much higher lately secondary to infection. Discussed how DM is treated more aggressively in the hospital and the impact of stress  response on blood sugars.   Pt denies any weight loss.   Discussed importance of good meal, supplement intake, and good glycemic control to promote healing. Pt is eager to discharge to CIR once bed is available.   Labs reviewed: CBGS: 103-190.   NUTRITION - FOCUSED PHYSICAL EXAM:    Most Recent Value  Orbital Region No depletion  Upper Arm Region Mild depletion  Thoracic and Lumbar Region No depletion  Buccal Region No depletion  Temple Region No depletion  Clavicle Bone Region Mild depletion  Clavicle and Acromion Bone Region No depletion  Scapular Bone Region No depletion  Dorsal Hand Mild depletion  Patellar Region No depletion  Anterior Thigh Region No depletion  Posterior Calf Region No depletion  Edema (RD Assessment) None  Hair Reviewed  Eyes Reviewed  Mouth Reviewed  Skin Reviewed  Nails Reviewed       Diet Order:   Diet Order            Diet - low sodium heart healthy                 EDUCATION NEEDS:   Not appropriate for education at this time  Skin:  Skin Assessment: Skin Integrity Issues: Skin Integrity Issues:: Stage I, Incisions Stage I: coccyx Diabetic Ulcer: - Incisions: s/p rt BKA  Last BM:  06/09/20  Height:   Ht Readings from Last 1 Encounters:  06/03/20 5' 10.98" (1.803 m)    Weight:   Wt Readings from Last 1 Encounters:  06/10/20 109.4 kg   BMI:  Body mass index is 33.65 kg/m.  Estimated Nutritional Needs:   Kcal:  2200-2400 kcal  Protein:  110-120 grams  Fluid:  >/= 2.5 L/day    Loistine Chance, RD, LDN, CDCES Registered Dietitian II Certified Diabetes Care and Education Specialist Please refer to [facility] for RD and/or RD on-call/weekend/after hours pager

## 2020-06-10 NOTE — H&P (Signed)
Physical Medicine and Rehabilitation Admission H&P    Chief Complaint  Patient presents with  . Functional deficits due to L-BKA    HPI: Steven [name] is a 64 year old male with history of T2DM, OSA, HTN, renal transplant 2006 who was admitted on 06/01/2020 with worsening left foot ulcer, despite abx. History taken from chart review and patient. 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 Sharol Given.  On 06/04/2020 he developed hypoxia and dyspnea as well as chest pressure due to pulmonary edema. He was treated with IV diuresis. 2D echocardiogram showed severely calcified aortic and mitral valves with severe aortic stenosis, ejection fraction of 60-65% with moderate concentric left ventricular hypertrophy.  Dr. Burt Knack consulted for input on severe AS and patient underwent cardiac cath for evaluation of potential TVAR. 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 Rest of TVAR work up to be done on outpatient basis and recommendations are to carefully escalate diuresis if renal function remains stable.   Blood sugars have been poorly controlled. Acute on chronic renal failure improving with BUN/SCr 47/2.49-->30/1.7. Acute blood loss anemia stable. Platelets trending down. Wound VAC was DC'd. Therapy ongoing and patient limited by left elbow pain, weakness as well as SOB with activity. CIR recommended due to functional decline. Please see preadmission assessment from earlier today as well.  Review of Systems  Constitutional: Negative for fever and malaise/fatigue.  HENT: Negative for hearing loss and tinnitus.   Eyes: Negative for blurred vision and double vision.  Respiratory: Negative for cough and shortness of breath.   Cardiovascular: Negative for chest pain and palpitations.  Gastrointestinal: Negative for constipation, diarrhea, nausea  and vomiting.  Genitourinary: Negative for dysuria and urgency.  Musculoskeletal: Positive for joint pain (left elbow pain and limitation due to tendinitis flarepa) and myalgias.  Skin: Negative for rash.  Neurological: Positive for weakness. Negative for sensory change, speech change and focal weakness.  Psychiatric/Behavioral: The patient is not nervous/anxious and does not have insomnia.   All other systems reviewed and are negative.   Past Medical History:  Diagnosis Date  . Cataract    removed by surgery  . Diabetes ([place])    type 2  . 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 Corliss Parish.  . GERD (gastroesophageal reflux disease)   . HLD (hyperlipidemia)   . Hyperparathyroidism ([place])   . Hypertension    Patient denies this DX, no meds  . Kidney transplant recipient   . Obesity   . Sleep apnea    uses CPAP nightly  . Venous insufficiency   . Vitamin D deficiency     Past Surgical History:  Procedure Laterality Date  . AMPUTATION Right 07/19/2017   Procedure: AMPUTATION RIGHT 2ND TOE;  Surgeon: Leandrew Koyanagi, MD;  Location: [place];  Service: Orthopedics;  Laterality: Right;  . AMPUTATION Left 06/03/2020   Procedure: LEFT BELOW KNEE AMPUTATION;  Surgeon: Newt Minion, MD;  Location: [place];  Service: Orthopedics;  Laterality: Left;  . COLONOSCOPY     greater than 10 yrs ago  . EYE SURGERY Bilateral    cataracts removed  . KIDNEY TRANSPLANT  2006  . NEPHRECTOMY TRANSPLANTED ORGAN  2006  . RIGHT HEART CATH AND CORONARY ANGIOGRAPHY N/A 06/08/2020   Procedure: RIGHT HEART CATH AND CORONARY ANGIOGRAPHY;  Surgeon: End,  Harrell Gave, MD;  Location: [place] [facility];  Service: Cardiovascular;  Laterality: N/A;  . UPPER GASTROINTESTINAL ENDOSCOPY    . WISDOM TOOTH EXTRACTION      Family History  Problem Relation Age of Onset  . Healthy Mother   . Healthy Father   . Colon cancer Neg Hx   . Rectal cancer Neg Hx   . Stomach  cancer Neg Hx    Social History: Married but separated.  Active--use cane for ambulation. Retired Academic librarian and still Science writer.  Reports that he has never smoked. He quit smokeless tobacco use about 22 months ago.  His smokeless tobacco use included chew. He reports that he does not drink alcohol and does not use drugs.   Allergies  Allergen Reactions  . Sulfa Antibiotics Rash   Medications Prior to Admission  Medication Sig Dispense Refill  . acetaminophen (TYLENOL) 500 MG tablet Take 500 mg by mouth every 6 (six) hours as needed for mild pain or fever.    Marland Kitchen atorvastatin (LIPITOR) 10 MG tablet Take 10 mg by mouth daily.     Marland Kitchen doxycycline (VIBRAMYCIN) 100 MG capsule Take 1 capsule (100 mg total) by mouth 2 (two) times daily. 14 capsule 0  . fenofibrate (TRICOR) 48 MG tablet Take 48 mg by mouth daily.    . furosemide (LASIX) 40 MG tablet Take 40 mg by mouth daily as needed for fluid (swelling).   1  . glyBURIDE (DIABETA) 5 MG tablet Take 5 mg by mouth 2 (two) times daily with a meal.     . insulin aspart (NOVOLOG) 100 UNIT/ML injection Inject 0-30 Units into the skin 3 (three) times daily as needed for high blood sugar. Sliding scale     . insulin detemir (LEVEMIR) 100 UNIT/ML FlexPen Inject 10 Units into the skin daily. 15 mL 1  . mycophenolate (MYFORTIC) 360 MG TBEC Take 360 mg by mouth 2 (two) times daily.    Marland Kitchen omeprazole (PRILOSEC) 20 MG capsule Take 1 capsule (20 mg total) by mouth daily. 90 capsule 3  . oxyCODONE (OXY IR/ROXICODONE) 5 MG immediate release tablet Take 1 tablet (5 mg total) by mouth every 4 (four) hours as needed for severe pain. 20 tablet 0  . tacrolimus (PROGRAF) 1 MG capsule Take 1 mg by mouth 2 (two) times daily.     . Vitamin D, Ergocalciferol, (DRISDOL) 1.25 MG (50000 UNIT) CAPS capsule TAKE 1 CAPSULE (50,000 UNITS TOTAL) BY MOUTH EVERY 7 (SEVEN) DAYS FOR 12 DOSES. (Patient taking differently: Take 50,000 Units by mouth every 7 (seven)  days. Wednesday) 12 capsule 0  . cephALEXin (KEFLEX) 500 MG capsule Take 1 capsule (500 mg total) by mouth 4 (four) times daily. (Patient not taking: Reported on 06/01/2020) 20 capsule 0  . Insulin Pen Needle (BD PEN NEEDLE NANO 2ND GEN) 32G X 4 MM MISC Use daily for glucose control 100 each 3  . Insulin Pen Needle 32G X 4 MM MISC Use as directed with insulin pen 100 each 1  . ONETOUCH VERIO test strip 1 each by Other route 3 (three) times daily.      Drug Regimen Review  Drug regimen was reviewed and remains appropriate with no significant issues identified  Home: Home Living Family/patient expects to be discharged to:: Private residence Living Arrangements: Alone Available Help at Discharge: Family, Available 24 hours/day Type of Home: House Home Access: Stairs to enter [employer] of Steps: 1 threshold (1.5" high) Entrance Stairs-Rails: None Home Layout: One  level Bathroom Shower/Tub: Multimedia programmer: Handicapped height Bathroom Accessibility: Yes Home Equipment: Grab bars - toilet, Grab bars - tub/shower   Functional History: Prior Function Level of Independence: Independent Comments: Very active, teaches paramedic classes. Likes hunting, going to ball games. Retired Print production planner Status:  Mobility: Bed Mobility Overal bed mobility: Modified Independent Bed Mobility: Rolling, Sidelying to Sit Rolling: Modified independent (Device/Increase time) Sidelying to sit: Min guard Supine to sit: Modified independent (Device/Increase time) General bed mobility comments: min guard to powerup from Left sidelying 2/2 L elbow pain.  Transfers Overall transfer level: Needs assistance Equipment used: Rolling walker (2 wheeled) Transfers: Sit to/from Stand, [employer] Transfers Sit to Stand: Mod assist Stand pivot transfers: Mod assist, Min assist General transfer comment: mod A on intial stand, min A after, elevated EOB. cues for technique with  rw and hand placement.  Ambulation/Gait Ambulation/Gait assistance: Mod assist, Min assist Gait Distance (Feet): 10 Feet Assistive device: Rolling walker (2 wheeled) Gait Pattern/deviations: Step-to pattern, Decreased stride length General Gait Details: Slow, fatigued gait with RW; heavy reliance on BUE support to hop on RLE; instability requiring min-modA to prevent posterior LOB; increased RR with activity and SOB with activity limiting progression. He requested to use [facility] prior to his procedure this morning, therefore session limited. Gait velocity: Decreased    ADL: ADL Overall ADL's : Needs assistance/impaired Eating/Feeding: Independent Grooming: Minimal assistance, Standing Grooming Details (indicate cue type and reason): sink Upper Body Bathing: Set up, Sitting Lower Body Bathing: Moderate assistance, Sit to/from stand, Sitting/lateral leans Lower Body Bathing Details (indicate cue type and reason): able to perform figure 4 Upper Body Dressing : Set up, Sitting Lower Body Dressing: Moderate assistance, Sitting/lateral leans, Sit to/from stand Toilet Transfer: Minimal assistance, Moderate assistance, Stand-pivot, RW, BSC Toilet Transfer Details (indicate cue type and reason): mod A on initial stand, min A after Toileting- Clothing Manipulation and Hygiene: Moderate assistance, Sit to/from stand, Sitting/lateral lean Functional mobility during ADLs: Minimal assistance, Cueing for sequencing, Cueing for safety, Rolling walker General ADL Comments: Pt completed bed mobility, stood EOB and sidestepped along EOB, seated rest break then SPT to [facility]. Mod A on initial stnad, min A after  Cognition: Cognition Overall Cognitive Status: Within Functional Limits for tasks assessed Orientation Level: Oriented X4 Cognition Arousal/Alertness: Awake/alert Behavior During Therapy: WFL for tasks assessed/performed Overall Cognitive Status: Within Functional Limits for tasks assessed   Blood  pressure (!) 105/57, pulse 81, temperature 98.5 F (36.9 C), temperature source Oral, resp. rate 20, height 5' 10.98" (1.803 m), weight 109.4 kg, SpO2 100 %. Physical Exam Vitals reviewed.  Constitutional:      Appearance: He is obese.  HENT:     Head: Normocephalic and atraumatic.     Right Ear: External ear normal.     Left Ear: External ear normal.     Nose: Nose normal.  Eyes:     General:        Right eye: No discharge.        Left eye: No discharge.     Extraocular Movements: Extraocular movements intact.  Cardiovascular:     Rate and Rhythm: Normal rate and regular rhythm.     Heart sounds: Murmur heard.   Pulmonary:     Effort: Pulmonary effort is normal. No respiratory distress.     Breath sounds: No stridor.  Abdominal:     General: Abdomen is flat. Bowel sounds are normal. There is no distension.  Musculoskeletal:  Cervical back: Normal range of motion and neck supple.     Comments: Left BKA with edema and tenderness Intrinsic atrophy. Left heel ulcer with central clearing with dry eschar around the edges.  Left elbow boggy with mild edema left proximal forearm with limited ROM due to pain.   Skin:    Comments: Left BKA with dressing CDI Vascular changes to left lower extremity Edema left lower extremity  Neurological:     Mental Status: He is alert.     Comments: Motor: Bilateral upper extremities: 5/5 proximal distal Right lower extremity: 5/5 proximal distally left lower extremity: Hip flexion, knee extension 4+/5 (pain inhibition) Sensation intact to light touch right lower extremity  Psychiatric:        Mood and Affect: Mood normal.        Behavior: Behavior normal.        Thought Content: Thought content normal.     Results for orders placed or performed during the hospital encounter of 06/01/20 (from the past 48 hour(s))  Glucose, capillary     Status: Abnormal   Collection Time: 06/08/20  2:17 PM  Result Value Ref Range   Glucose-Capillary 188  (H) 70 - 99 mg/dL    Comment: Glucose reference range applies only to samples taken after fasting for at least 8 hours.   Comment 1 Notify RN    Comment 2 Document in Chart   Glucose, capillary     Status: Abnormal   Collection Time: 06/08/20  4:21 PM  Result Value Ref Range   Glucose-Capillary 138 (H) 70 - 99 mg/dL    Comment: Glucose reference range applies only to samples taken after fasting for at least 8 hours.  Glucose, capillary     Status: Abnormal   Collection Time: 06/08/20  9:07 PM  Result Value Ref Range   Glucose-Capillary 187 (H) 70 - 99 mg/dL    Comment: Glucose reference range applies only to samples taken after fasting for at least 8 hours.  Basic metabolic panel     Status: Abnormal   Collection Time: 06/09/20  5:45 AM  Result Value Ref Range   Sodium 138 135 - 145 mmol/L   Potassium 4.3 3.5 - 5.1 mmol/L   Chloride 107 98 - 111 mmol/L   CO2 23 22 - 32 mmol/L   Glucose, Bld 162 (H) 70 - 99 mg/dL    Comment: Glucose reference range applies only to samples taken after fasting for at least 8 hours.   BUN 30 (H) 8 - 23 mg/dL   Creatinine, Ser 1.70 (H) 0.61 - 1.24 mg/dL   Calcium 8.4 (L) 8.9 - 10.3 mg/dL   GFR, Estimated 45 (L) >60 mL/min    Comment: (NOTE) Calculated using the CKD-EPI Creatinine Equation (2021)    Anion gap 8 5 - 15    Comment: Performed at [facility] [address] [place], [place] [address]  CBC     Status: Abnormal   Collection Time: 06/09/20  5:45 AM  Result Value Ref Range   WBC 4.6 4.0 - 10.5 K/uL   RBC 3.36 (L) 4.22 - 5.81 MIL/uL   Hemoglobin 8.5 (L) 13.0 - 17.0 g/dL   HCT 27.4 (L) 39 - 52 %   MCV 81.5 80.0 - 100.0 fL   MCH 25.3 (L) 26.0 - 34.0 pg   MCHC 31.0 30.0 - 36.0 g/dL   RDW 14.6 11.5 - 15.5 %   Platelets 136 (L) 150 - 400 K/uL  nRBC 0.0 0.0 - 0.2 %    Comment: Performed at [facility], [place] [address], [place], [place] [address]  Glucose, capillary     Status: Abnormal   Collection Time: 06/09/20  5:57 AM    Result Value Ref Range   Glucose-Capillary 147 (H) 70 - 99 mg/dL    Comment: Glucose reference range applies only to samples taken after fasting for at least 8 hours.  Glucose, capillary     Status: Abnormal   Collection Time: 06/09/20 11:28 AM  Result Value Ref Range   Glucose-Capillary 119 (H) 70 - 99 mg/dL    Comment: Glucose reference range applies only to samples taken after fasting for at least 8 hours.  Glucose, capillary     Status: Abnormal   Collection Time: 06/09/20  4:16 PM  Result Value Ref Range   Glucose-Capillary 103 (H) 70 - 99 mg/dL    Comment: Glucose reference range applies only to samples taken after fasting for at least 8 hours.  Glucose, capillary     Status: Abnormal   Collection Time: 06/09/20  9:54 PM  Result Value Ref Range   Glucose-Capillary 105 (H) 70 - 99 mg/dL    Comment: Glucose reference range applies only to samples taken after fasting for at least 8 hours.  Glucose, capillary     Status: Abnormal   Collection Time: 06/10/20  6:27 AM  Result Value Ref Range   Glucose-Capillary 105 (H) 70 - 99 mg/dL    Comment: Glucose reference range applies only to samples taken after fasting for at least 8 hours.  Glucose, capillary     Status: Abnormal   Collection Time: 06/10/20 11:14 AM  Result Value Ref Range   Glucose-Capillary 190 (H) 70 - 99 mg/dL    Comment: Glucose reference range applies only to samples taken after fasting for at least 8 hours.   No results found.     Medical Problem List and Plan: 1. Deficits with mobility, transfers, endurance, self-care secondary to left BKA secondary to osteomyelitis.  -patient may shower with stump covered  -ELOS/Goals: 10-15 days/supervision/mod I  Admit to CIR 2.  Antithrombotics: -DVT/anticoagulation:  Pharmaceutical: Lovenox  -antiplatelet therapy: N/A 3. Pain Management: Oxycodone prn   Monitor postoperative pain with increased exertion  Monitor for phantom limb pain 4. Mood: Team  support  -antipsychotic agents: N/A 5. Neuropsych: This patient is capable of making decisions on his own behalf. 6. Skin/Wound Care: Monitor wound for healing.    Continue protein supplements. 7. Fluids/Electrolytes/Nutrition: Monitor intake/output.  Heart healthy diet.  CMP ordered for tomorrow. 8.  T2DM: Monitor BS ac/hs. Continue glyburide 1 mg bid with Levemir 10 units qhs and 15 in am. Novolog 6 TID  Monitor with increased exertion. 9. Acute on chronic anemia:   Hb 10-->8.5 on 11/9, CBC ordered for tomorrow 10. Thrombocytopenia  CBC ordered for tomorrow 11. Renal transplant/CKD IIIb:  Followed by Dr. [name].   CMP ordered for tomorrow 12. Aortic stenosis/fluid overload: Continue Lasix. Monitor weights and for signs of overload.  13. Left elbow tendinitis/bursitis: Ice, compression, pressure relief. 14. Right heel ulcer: Foam dressing per Dr. Sharol Given. Will order [facility] for pressure relief measures also.    Bary Leriche, PA-C 06/10/2020  I have personally performed a face to face diagnostic evaluation, including, but not limited to relevant history and physical exam findings, of this patient and developed relevant assessment and plan.  Additionally, I have reviewed and concur with the physician assistant's documentation  above.  Delice Lesch, MD, ABPMR  The patient's status has not changed. Any changes from the pre-admission screening or documentation from the acute chart are noted above.   Delice Lesch, MD, ABPMR

## 2020-06-10 NOTE — Progress Notes (Signed)
Jamse Arn, MD  Physician  Physical Medicine and Rehabilitation  PMR Pre-admission     Signed  Date of Service:  06/05/2020  1:22 PM      Related encounter: ED to Hosp-Admission (Current) from 06/01/2020 in [place] [facility]U      Signed       PMR Admission Coordinator Pre-Admission Assessment   Patient: Steven [name] is an 64 y.o., male MRN: [id number] DOB: 1956/05/02 Height: 5' 10.98" (180.3 cm) Weight: 109.4 kg   Insurance Information HMO:     PPO: yes     PCP:      IPA:      80/20:      OTHER:  PRIMARY: [employer]      Policy#: [id number]     Subscriber: patient CM Name: Malachy Mood      Phone#: [phone]     Fax#: [phone] Pre-Cert#: [id number]      Employer:  Josem [employer]auf[employer] provided by Malachy Mood for admit to CIR 06/10/20. Pt is approved for 14 days. Updates due to [facility] (see above).  Benefits:  Phone #: [phone]     Name:  Eff. Date: 08/02/2019-still active     Deduct: $1,250 ($1250 met)      Out of Pocket Max: $4,890 ($4,890 met)      Life Max: NA CIR: $300/admission co-pay, then, 80% coverage, 20% co-insurance      SNF: 80% coverage, 20% co-insurance; with a limit of 100 days/cal yr  Outpatient: $26 or $52 co-pay per visit pending provider type; limited by medical necessity    Home Health: 80% coverage, 20% co-insurance; limited by medical necessity       DME: 80% coverage, 20% co-insurance Providers:  SECONDARY: None      Policy#:      Phone#:    Development worker, community:       Phone#:    The Engineer, petroleum" for patients in Inpatient Rehabilitation Facilities with attached "Privacy Act [place] Records" was provided and verbally reviewed with: Patient   Emergency Contact Information         Contact Information     Name Relation Home Work Mobile    Das,Wendie Spouse     [phone]         Current Medical History  Patient Admitting Diagnosis: L BKA    History of Present Illness: Pt is a 64 yo male with PMH  of obesity, OSA, HTN, hyperlipidemia, DM type 2, ESRD s/p renal transplant in 2006. Pt admitted to [facility] on 06/01/20 due to a worsening diabetic pressure ulcer on his left foot. Work up with MRI revealed osteomyelitis. Pt was placed initally on broad-spectrum antibiotics and underwent tibial amputation by Dr. Sharol Given on 11/3. Pt with nonoliguric AKI on CKD likely in setting of dehydration; Creatine monitored. Pt with acute diastolic CHF/severe aortic stenosis with mild lower extremity edema. Cardiology was consulted due to new finding for severe aorticstenosis and heart cath completed; TAVR being planned as outpatient. Pt recommended for CIR. Pt is to admit to CIR on 06/10/20.   [name] medical record from [facility] has been reviewed by the rehabilitation admission coordinator and physician.   Past Medical History      Past Medical History:  Diagnosis Date  . Cataract      removed by surgery  . Diabetes ([place])      type 2  . Diabetic glomerulosclerosis [facility])      [facility] 03/21/12 by Dr.  Corliss Parish.  . GERD (gastroesophageal reflux disease)    . HLD (hyperlipidemia)    . Hyperparathyroidism ([place])    . Hypertension      Patient denies this DX, no meds  . Kidney transplant recipient    . Obesity    . Sleep apnea      uses CPAP nightly  . Venous insufficiency    . Vitamin D deficiency        Family History   family history includes Healthy in his father and mother.   Prior Rehab/Hospitalizations Has the patient had prior rehab or hospitalizations prior to admission? Yes   Has the patient had major surgery during 100 days prior to admission? Yes              Current Medications   Current Facility-Administered Medications:  .  (feeding supplement) PROSource Plus liquid 30 mL, 30 mL, Oral, TID BM, End, Christopher, MD, 30 mL at 06/10/20 1212 .  0.9 %  sodium chloride infusion, , Intravenous, PRN, End, Christopher, MD, Last Rate: 10 mL/hr at  06/01/20 1838, 250 mL at 06/01/20 1838 .  0.9 %  sodium chloride infusion, 250 mL, Intravenous, PRN, End, Harrell Gave, MD .  acetaminophen (TYLENOL) tablet 650 mg, 650 mg, Oral, Q6H PRN, 650 mg at 06/03/20 2257 **OR** acetaminophen (TYLENOL) suppository 650 mg, 650 mg, Rectal, Q6H PRN, End, Christopher, MD .  atorvastatin (LIPITOR) tablet 10 mg, 10 mg, Oral, Daily, End, Christopher, MD, 10 mg at 06/10/20 0826 .  docusate sodium (COLACE) capsule 100 mg, 100 mg, Oral, BID, End, Christopher, MD, 100 mg at 06/10/20 0826 .  fenofibrate tablet 54 mg, 54 mg, Oral, Daily, End, Christopher, MD, 54 mg at 06/10/20 0826 .  furosemide (LASIX) tablet 20 mg, 20 mg, Oral, Daily, [place]y, Silver Huguenin, MD, 20 mg at 06/10/20 0826 .  glyBURIDE (DIABETA) tablet 5 mg, 5 mg, Oral, BID WC, End, Christopher, MD, 5 mg at 06/10/20 0828 .  heparin injection 5,000 Units, 5,000 Units, Subcutaneous, Q8H, End, Christopher, MD, 5,000 Units at 06/10/20 1223 .  HYDROmorphone (DILAUDID) injection 0.5 mg, 0.5 mg, Intravenous, Q4H PRN, End, Christopher, MD, 0.5 mg at 06/08/20 1419 .  insulin aspart (novoLOG) injection 0-15 Units, 0-15 Units, Subcutaneous, TID WC, End, Christopher, MD, 3 Units at 06/10/20 1212 .  insulin aspart (novoLOG) injection 6 Units, 6 Units, Subcutaneous, TID WC, End, Christopher, MD, 6 Units at 06/10/20 1212 .  insulin detemir (LEVEMIR) injection 10 Units, 10 Units, Subcutaneous, QHS, End, Christopher, MD, 10 Units at 06/09/20 2155 .  insulin detemir (LEVEMIR) injection 15 Units, 15 Units, Subcutaneous, Daily, End, Christopher, MD, 15 Units at 06/10/20 [phone] .  metoCLOPramide (REGLAN) tablet 5-10 mg, 5-10 mg, Oral, Q8H PRN **OR** metoCLOPramide (REGLAN) injection 5-10 mg, 5-10 mg, Intravenous, Q8H PRN, End, Christopher, MD .  mycophenolate (MYFORTIC) EC tablet 360 mg, 360 mg, Oral, BID, End, Christopher, MD, 360 mg at 06/10/20 0826 .  ondansetron (ZOFRAN) tablet 4 mg, 4 mg, Oral, Q6H PRN **OR** ondansetron (ZOFRAN)  injection 4 mg, 4 mg, Intravenous, Q6H PRN, End, Christopher, MD .  oxyCODONE (Oxy IR/ROXICODONE) immediate release tablet 5 mg, 5 mg, Oral, Q4H PRN, End, Christopher, MD, 5 mg at 06/10/20 2119 .  pantoprazole (PROTONIX) EC tablet 40 mg, 40 mg, Oral, Daily, End, Christopher, MD, 40 mg at 06/10/20 0827 .  polyethylene glycol (MIRALAX / GLYCOLAX) packet 17 g, 17 g, Oral, Daily PRN, End, Christopher, MD .  sodium chloride flush (NS) 0.9 % injection 3 mL,  3 mL, Intravenous, Q12H, End, Christopher, MD, 3 mL at 06/10/20 0827 .  sodium chloride flush (NS) 0.9 % injection 3 mL, 3 mL, Intravenous, PRN, End, Christopher, MD .  tacrolimus (PROGRAF) capsule 1 mg, 1 mg, Oral, BID, End, Christopher, MD, 1 mg at 06/10/20 0825   Patients Current Diet:     Diet Order                      Diet Carb Modified Fluid consistency: Thin; Room service appropriate? Yes; Fluid restriction: 2000 mL Fluid  Diet effective now                      Precautions / Restrictions Precautions Precautions: Fall Precaution Comments: L BKA wound back; watch SpO2/HR Other Brace: limb protector Restrictions Weight Bearing Restrictions: Yes LLE Weight Bearing: Non weight bearing    Has the patient had 2 or more falls or a fall with injury in the past year? 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 Independent   Indoor Mobility: Did the patient need assistance with walking from room to room (with or without device)? Independent   Stairs: Did the patient need assistance with internal or external stairs (with or without device)? Independent   Functional Cognition: Did the patient need help planning regular tasks such as shopping or remembering to take medications? [place] / Equipment Home Assistive Devices/Equipment: Environmental consultant (specify type),  CBG Meter (front wheeled walker) Home Equipment: Grab bars - toilet, Grab bars - tub/shower   Prior Device Use: Indicate devices/aids used by the patient prior to current illness, exacerbation or injury? None of the above   Current Functional Level Cognition   Overall Cognitive Status: Within Functional Limits for tasks assessed Orientation Level: Oriented X4    Extremity Assessment (includes Sensation/Coordination)   Upper Extremity Assessment: Overall WFL for tasks assessed  Lower Extremity Assessment: Defer to PT evaluation LLE Deficits / Details: s/p L BKA; hip and knee functionally at least 3/5     ADLs   Overall ADL's : Needs assistance/impaired Eating/Feeding: Independent Grooming: Minimal assistance, Standing Grooming Details (indicate cue type and reason): sink Upper Body Bathing: Set up, Sitting Lower Body Bathing: Moderate assistance, Sit to/from stand, Sitting/lateral leans Lower Body Bathing Details (indicate cue type and reason): able to perform figure 4 Upper Body Dressing : Set up, Sitting Lower Body Dressing: Moderate assistance, Sitting/lateral leans, Sit to/from stand Toilet Transfer: Minimal assistance, Moderate assistance, Stand-pivot, RW, BSC Toilet Transfer Details (indicate cue type and reason): mod A on initial stand, min A after Toileting- Clothing Manipulation and Hygiene: Moderate assistance, Sit to/from stand, Sitting/lateral lean Functional mobility during ADLs: Minimal assistance, Cueing for sequencing, Cueing for safety, Rolling walker General ADL Comments: Pt completed bed mobility, stood EOB and sidestepped along EOB, seated rest break then SPT to [facility]. Mod A on initial stnad, min A after     Mobility   Overal bed mobility: Modified Independent Bed Mobility: Rolling, Sidelying to Sit Rolling: Modified independent (Device/Increase time) Sidelying to sit: Min guard Supine to sit: Modified independent (Device/Increase time) General bed mobility  comments: min guard to powerup from Left sidelying 2/2 L elbow pain.      Transfers   Overall transfer level: Needs assistance Equipment used: Rolling walker (2 wheeled) Transfers: Sit to/from Stand,  Stand Pivot Transfers Sit to Stand: Mod assist Stand pivot transfers: Mod assist, Min assist General transfer comment: mod A on intial stand, min A after, elevated EOB. cues for technique with rw and hand placement.      Ambulation / Gait / Stairs / Wheelchair Mobility   Ambulation/Gait Ambulation/Gait assistance: Mod assist, Min assist Gait Distance (Feet): 10 Feet Assistive device: Rolling walker (2 wheeled) Gait Pattern/deviations: Step-to pattern, Decreased stride length General Gait Details: Slow, fatigued gait with RW; heavy reliance on BUE support to hop on RLE; instability requiring min-modA to prevent posterior LOB; increased RR with activity and SOB with activity limiting progression. He requested to use [facility] prior to his procedure this morning, therefore session limited. Gait velocity: Decreased     Posture / Balance Balance Overall balance assessment: Needs assistance Sitting-balance support: Feet supported Sitting balance-Leahy Scale: Good Standing balance support: Bilateral upper extremity supported, During functional activity Standing balance-Leahy Scale: Poor Standing balance comment: Dependent on BUE support, reliant on external assist for balance and safety     Special needs/care consideration Skin: pressure injury 11/8 on coccxy stage 1, right heel diabetic ulcer, LLE surgical incision; wound vac to LLE    and Diabetic management : yes    Previous Home Environment (from acute therapy documentation) Living Arrangements: Alone Available Help at Discharge: Family, Available 24 hours/day Type of Home: House Home Layout: One level Home Access: Stairs to enter Entrance Stairs-Rails: None Entrance Stairs-Number of Steps: 1 threshold (1.5" high) Bathroom Shower/Tub:  Multimedia programmer: Handicapped height Bathroom Accessibility: Yes How Accessible: Accessible via wheelchair Home Care Services: No   Discharge Living Setting Plans for Discharge Living Setting: Alone, House (separated) Type of Home at Discharge: House Discharge Home Layout: One level Discharge Home Access: Level entry Discharge Bathroom Shower/Tub: Walk-in shower (grab bars, chair) Discharge Bathroom Toilet: Standard Discharge Bathroom Accessibility: Yes How Accessible: Accessible via walker Does the patient have any problems obtaining your medications?: No   Social/Family/Support Systems Patient Roles: Spouse, Parent (teaches EMT paramedics ) Contact Information: wife: [name] [phone]) Anticipated Caregiver: Wife + grown children + his parents Anticipated Caregiver's Contact Information: see above Ability/Limitations of Caregiver: supervision Caregiver Availability: Intermittent (combo of wife + parents to assist) Discharge Plan Discussed with Primary Caregiver: Yes (pt and his wife) Is Caregiver In Agreement with Plan?: Yes Does Caregiver/Family have Issues with Lodging/Transportation while Pt is in Rehab?: No   Goals Patient/Family Goal for Rehab: PT/OT: Mod I; SLP: NA Expected length of stay: 8-12 days Pt/Family Agrees to Admission and willing to participate: Yes Program Orientation Provided & Reviewed with Pt/Caregiver Including Roles  & Responsibilities: Yes (pt and his wife)  Barriers to Discharge: Lack of/limited family support, Weight bearing restrictions (lives alone; new BKA)   Decrease burden of Care through IP rehab admission: NA   Possible need for SNF placement upon discharge: Not anticipated. Pt has an accessible home and good support from family. Anticipate he can reach a Mod I/Supervision level to return home safely after a CIR stay.    Patient Condition: I have reviewed medical records from [facility], spoken with MD, and  patient and spouse. I met with patient at the bedside for inpatient rehabilitation assessment.  Patient will benefit from ongoing PT and OT, can actively participate in 3 hours of therapy a day 5 days of the week, and can make measurable gains during the admission.  Patient will also benefit from the coordinated team approach during an Inpatient Acute  Rehabilitation admission.  The patient will receive intensive therapy as well as Rehabilitation physician, nursing, social worker, and care management interventions.  Due to safety, skin/wound care, disease management, medication administration, pain management and patient education the patient requires 24 hour a day rehabilitation nursing.  The patient is currently Mod A for transfers and Min/Mod A for with mobility and Min/mod A for basic ADLs.  Discharge setting and therapy post discharge at home with home health is anticipated.  Patient has agreed to participate in the Acute Inpatient Rehabilitation Program and will admit 06/10/20.   Preadmission Screen Completed By:  Raechel Ache, 06/10/2020 1:04 PM ______________________________________________________________________   Discussed status with Dr. Posey Pronto  on 06/10/20 at 12:20PM and received approval for admission today.   Admission Coordinator:  Raechel Ache, OT, time 1:04PM/Date 06/10/20.    Assessment/Plan: Diagnosis:  L BKA   1. Does the need for close, 24 hr/day Medical supervision in concert with the patient's rehab needs make it unreasonable for this patient to be served in a less intensive setting? Yes  2. Co-Morbidities requiring supervision/potential complications: obesity, OSA, HTN, hyperlipidemia, DM type 2, ESRD s/p renal transplant in 2006. 3. Due to bowel management, safety, skin/wound care, disease management, pain management and patient education, does the patient require 24 hr/day rehab nursing? Yes 4. Does the patient require coordinated care of a physician, rehab nurse, PT, OT to  address physical and functional deficits in the context of the above medical diagnosis(es)? Yes Addressing deficits in the following areas: balance, endurance, locomotion, strength, transferring, bathing, dressing, toileting and psychosocial support 5. Can the patient actively participate in an intensive therapy program of at least 3 hrs of therapy 5 days a week? Yes 6. The potential for patient to make measurable gains while on inpatient rehab is excellent 7. Anticipated functional outcomes upon discharge from inpatient rehab: modified independent and supervision PT, modified independent and supervision OT, n/a SLP 8. Estimated rehab length of stay to reach the above functional goals is: 9-14 days. 9. Anticipated discharge destination: Home 10. Overall Rehab/Functional Prognosis: good   MD Signature: Delice Lesch, MD, ABPMR        Revision History                     Note Details  Author Jamse Arn, MD File Time 06/10/2020  1:06 PM  Author Type Physician Status Signed  Last Editor Jamse Arn, MD Service Physical Medicine and Rehabilitation

## 2020-06-10 NOTE — Progress Notes (Signed)
The patient is postop day 8 status post left below-knee amputation.  He is comfortable and sitting up in bed.   Wound VAC is functioning 0 cc in the canister.  Wound VAC was removed today without difficulty.  Wound has well apposed wound edges no necrosis swelling is well controlled no wound dehiscence.  No cellulitis.  Patient is hoping to go to inpatient rehab.  May get wound wet in shower with antibacterial soap and water.  Clean dry dressing was applied

## 2020-06-10 NOTE — Discharge Summary (Signed)
Steven [name] KGU:[id number] DOB: 1956-03-27 DOA: 06/01/2020  PCP: Isaac Bliss, Rayford Halsted, MD  Admit date: 06/01/2020 Discharge date: 06/10/2020  Admitted From: Home Disposition:: Inpatient rehab  Recommendations for Outpatient Follow-up:  1. Follow up with PCP in 1-2 weeks 2. Please obtain BMP/CBC in one week 3. Please follow up on the following pending results:   Equipment/Devices: 3 and 1 bedside commode, wheelchair/cushion    Discharge Condition: Stable CODE STATUS: Full    Hospital Course:   Mr. Christmas is a 64 year old male with medical history significant for obesity, class I (BMI 33), OSA, HTN, HLD, type 2 diabetes, CKD Stage 3b status post renal transplant in 2006 on immunosuppressants who presented to [facility] long ED with worsening diabetic pressure ulcer of left foot of 1 to 2 weeks duration with failed outpatient Keflex/doxycycline and was found to have osteomyelitis of left foot by MRI.  Osteomyelitis of left foot, in setting of diabetic foot ulcer with failed outpatient antibiotics, resolved -No longer requiring antibiotics after tibial amputation by Dr. Sharol Given on 11/3. -PT/OT recommending CIR for continued evaluation -Continue oxycodone IR 5 mg as needed severe pain with as needed MiraLAX, scheduled senna docusate  Nonoliguric AKI on CKD stage IIIb.  Status post renal transplant (2006) on immunosuppressive therapy. AKI in setting of dehydration consistent with prerenal etiology.  Now creatinine back at baseline 1.8-2.21 -Can continue home immunosuppressive therapy (tacrolimus, CellCept) per nephrology with no changes  Severe aortic stenosis, new diagnosis.  Status post left and right heart cath by cardiology this hospitalization for evaluation of coronary anatomy.  Otherwise euvolemic on exam -Continue low-dose Lasix 20 mg daily given currently euvolemic, can uptitrate as necessary per cardiology -Per cardiology patient okay to undergo rehabilitation for his left  low knee amputation at inpatient rehab -Cardiology will arrange further outpatient studies for his required aortic valve replacement  Type 2 diabetes.  History of poor control, A1c 9.3 -Continue home glipizide, Levemir 15 units daily, 10 units nightly  HLD/hypertriglyceridemia -Continue Lipitor 10 mg, fenofibrate  GERD, stable-continue PPI   Consultations:  Nephrology, cardiology, orthopedics  Procedures/Studies: TTE, 06/04/20 1. Severely calcified aortic and mitral valves with severe aortic  stenosis and mild mitral stenosis.  2. Left ventricular ejection fraction, by estimation, is 60 to 65%. The  left ventricle has normal function. The left ventricle has no regional  wall motion abnormalities. There is moderate concentric left ventricular  hypertrophy. Left ventricular  diastolic parameters are consistent with Grade II diastolic dysfunction  (pseudonormalization). Elevated left atrial pressure.  3. Right ventricular systolic function is normal. The right ventricular  size is normal.  4. The mitral valve is normal in structure. Mild mitral valve  regurgitation. Mild mitral stenosis. Moderate mitral annular  calcification.  5. The aortic valve is calcified. There is severe calcifcation of the  aortic valve. There is severe thickening of the aortic valve. Aortic valve  regurgitation is not visualized. Severe aortic valve stenosis. Aortic  valve mean gradient measures 67.0  mmHg.  6. The inferior vena cava is normal in size with greater than 50%  respiratory variability, suggesting right atrial pressure of 3 mmHg.   Carotid duplex bilateral 11/6  Summary:  Right Carotid: Velocities in the right ICA are consistent with a 1-39%  stenosis.   Left Carotid: Velocities in the left ICA are consistent with a 1-39%  stenosis.   Vertebrals: Bilateral vertebral arteries demonstrate antegrade flow.  Subclavians: Normal flow hemodynamics were seen in bilateral subclavian  arteries.    Right heart cath and coronary angiography 11/8  Conclusions: 1. Mild to moderate, non-obstructive coronary artery disease with 30-40% mid LAD stenosis.  No significant disease identified in the LCx or RCA. 2. Moderately to severely elevated left heart, right heart, and pulmonary artery pressures. 3. Normal cardiac output/index.  Recommendations: 1. Ongoing workup of severe aortic stenosis per valve team. 2. Maintain net even to slightly negative fluid balance today.  If renal function remains stable, careful escalation of diuresis is recommended. 3. Medical therapy and risk factor modification to prevent progression of coronary artery disease.    Subjective:  Discharge Exam: Vitals:   06/10/20 0431 06/10/20 0823  BP: 126/67 (!) 105/57  Pulse: 78 81  Resp: 16 20  Temp: 98.5 F (36.9 C)   SpO2: 99% 100%   Vitals:   06/09/20 1129 06/09/20 2042 06/10/20 0431 06/10/20 0823  BP: (!) 125/55 125/82 126/67 (!) 105/57  Pulse: 78 83 78 81  Resp:  18 16 20   Temp: 98.9 F (37.2 C) 98.7 F (37.1 C) 98.5 F (36.9 C)   TempSrc: Oral Oral Oral   SpO2: 100% 100% 99% 100%  Weight:   109.4 kg   Height:        General: Lying in bed, no apparent distress Eyes: EOMI, anicteric ENT: Oral Mucosa clear and moist Cardiovascular: regular rate and rhythm, loud systolic murmur appreciated, no peripheral edema Respiratory: Normal respiratory effort on room air, lungs clear to auscultation bilaterally Abdomen: soft, non-distended, non-tender, normal bowel sounds MSK: Left AKA stump with compression wrap in place Neurologic: Grossly no focal neuro deficit.Mental status AAOx3, speech normal, Psychiatric:Appropriate affect, and mood  Discharge Diagnoses:  Principal Problem:   Osteomyelitis of foot ([place]) Active Problems:   Diabetes mellitus ([place])   Hyperlipidemia   Kidney transplant recipient   Open wound of left foot   Diabetic foot infection ([place])   CKD (chronic  kidney disease), stage III ([place])   Aortic valve stenosis   Acute blood loss anemia   S/P BKA (below knee amputation) unilateral, left [facility])    Discharge Instructions  Discharge Instructions    Change dressing   Complete by: As directed    Daily dry dressing change. May get incision wet in shower cleanse with antibacterial soap and water   Diet - low sodium heart healthy   Complete by: As directed    Discharge wound care:   Complete by: As directed    Daily dry dressing changes to left below-knee amputation stump.  May get incision wet and wash with antibacterial soap and water and shower.   Increase activity slowly   Complete by: As directed      Allergies as of 06/10/2020      Reactions   Sulfa Antibiotics Rash      Medication List    STOP taking these medications   cephALEXin 500 MG capsule Commonly known as: Keflex   doxycycline 100 MG capsule Commonly known as: VIBRAMYCIN     TAKE these medications   acetaminophen 500 MG tablet Commonly known as: TYLENOL Take 1 tablet (500 mg total) by mouth every 6 (six) hours as needed for mild pain or fever.   atorvastatin 10 MG tablet Commonly known as: LIPITOR Take 1 tablet (10 mg total) by mouth daily.   BD Pen Needle Nano 2nd Gen 32G X 4 MM Misc Generic drug: Insulin Pen Needle Use daily for glucose control   Insulin Pen Needle 32G X 4 MM Misc Use  as directed with insulin pen   docusate sodium 100 MG capsule Commonly known as: COLACE Take 1 capsule (100 mg total) by mouth 2 (two) times daily.   fenofibrate 48 MG tablet Commonly known as: TRICOR Take 1 tablet (48 mg total) by mouth daily.   furosemide 20 MG tablet Commonly known as: LASIX Take 1 tablet (20 mg total) by mouth daily. Start taking on: June 11, 2020 What changed:   medication strength  how much to take  when to take this  reasons to take this   glyBURIDE 5 MG tablet Commonly known as: DIABETA Take 1 tablet (5 mg total) by mouth 2  (two) times daily with a meal.   insulin aspart 100 UNIT/ML injection Commonly known as: novoLOG Inject 0-30 Units into the skin 3 (three) times daily as needed for high blood sugar. Sliding scale   insulin detemir 100 UNIT/ML FlexPen Commonly known as: LEVEMIR Inject 10 Units into the skin daily.   mycophenolate 360 MG Tbec EC tablet Commonly known as: MYFORTIC Take 1 tablet (360 mg total) by mouth 2 (two) times daily.   omeprazole 20 MG capsule Commonly known as: PRILOSEC Take 1 capsule (20 mg total) by mouth daily.   OneTouch Verio test strip Generic drug: glucose blood 1 each by Other route 3 (three) times daily.   oxyCODONE 5 MG immediate release tablet Commonly known as: Oxy IR/ROXICODONE Take 1 tablet (5 mg total) by mouth every 4 (four) hours as needed for severe pain.   polyethylene glycol 17 g packet Commonly known as: MIRALAX / GLYCOLAX Take 17 g by mouth daily as needed for mild constipation.   tacrolimus 1 MG capsule Commonly known as: PROGRAF Take 1 capsule (1 mg total) by mouth 2 (two) times daily.   Vitamin D (Ergocalciferol) 1.25 MG (50000 UNIT) Caps capsule Commonly known as: DRISDOL Take 1 capsule (50,000 Units total) by mouth every 7 (seven) days for 12 doses. What changed: additional instructions            Discharge Care Instructions  (From admission, onward)         Start     Ordered   06/10/20 0000  Change dressing        06/10/20 0815   06/10/20 0000  Discharge wound care:       Comments: Daily dry dressing changes to left below-knee amputation stump.  May get incision wet and wash with antibacterial soap and water and shower.   06/10/20 1416          Follow-up Information    Persons, Bevely Palmer, PA In 1 week.   Specialty: Orthopedic Surgery Contact information: [address] [place] [address] [phone]              Allergies  Allergen Reactions  . Sulfa Antibiotics Rash        The results of  significant diagnostics from this hospitalization (including imaging, microbiology, ancillary and laboratory) are listed below for reference.     Microbiology: Recent Results (from the past 240 hour(s))  Respiratory Panel by RT PCR (Flu A&B, Covid) - Nasopharyngeal Swab     Status: None   Collection Time: 06/01/20 11:02 AM   Specimen: Nasopharyngeal Swab  Result Value Ref Range Status   SARS Coronavirus 2 by RT PCR NEGATIVE NEGATIVE Final    Comment: (NOTE) SARS-CoV-2 target nucleic acids are NOT DETECTED.  The SARS-CoV-2 RNA is generally detectable in upper respiratoy specimens during the acute phase of infection.  The lowest concentration of SARS-CoV-2 viral copies this assay can detect is 131 copies/mL. A negative result does not preclude SARS-Cov-2 infection and should not be used as the sole basis for treatment or other patient management decisions. A negative result may occur with  improper specimen collection/handling, submission of specimen other than nasopharyngeal swab, presence of viral mutation(s) within the areas targeted by this assay, and inadequate number of viral copies (<131 copies/mL). A negative result must be combined with clinical observations, patient history, and epidemiological information. The expected result is Negative.  Fact Sheet for Patients:  PinkCheek.be  Fact Sheet for Healthcare Providers:  GravelBags.it  This test is no t yet approved or cleared by the [place] FDA and  has been authorized for detection and/or diagnosis of SARS-CoV-2 by FDA under an Emergency Use Authorization (EUA). This EUA will remain  in effect (meaning this test can be used) for the duration of the COVID-19 declaration under Section 564(b)(1) of the Act, 21 U.S.C. section 360bbb-3(b)(1), unless the authorization is terminated or revoked sooner.     Influenza A by PCR NEGATIVE NEGATIVE Final   Influenza B by  PCR NEGATIVE NEGATIVE Final    Comment: (NOTE) The Xpert Xpress SARS-CoV-2/FLU/RSV assay is intended as an aid in  the diagnosis of influenza from Nasopharyngeal swab specimens and  should not be used as a sole basis for treatment. Nasal washings and  aspirates are unacceptable for Xpert Xpress SARS-CoV-2/FLU/RSV  testing.  Fact Sheet for Patients: PinkCheek.be  Fact Sheet for Healthcare Providers: GravelBags.it  This test is not yet approved or cleared by the [place] FDA and  has been authorized for detection and/or diagnosis of SARS-CoV-2 by  FDA under an Emergency Use Authorization (EUA). This EUA will remain  in effect (meaning this test can be used) for the duration of the  Covid-19 declaration under Section 564(b)(1) of the Act, 21  U.S.C. section 360bbb-3(b)(1), unless the authorization is  terminated or revoked. Performed at [facility], [place] [address] [place], [place] [address]   Culture, blood (routine x 2)     Status: None   Collection Time: 06/01/20 11:47 AM   Specimen: BLOOD  Result Value Ref Range Status   Specimen Description   Final    BLOOD RIGHT ANTECUBITAL Performed at [place] [address], [place], [place] [address]    Special Requests   Final    BOTTLES DRAWN AEROBIC AND ANAEROBIC Blood Culture results may not be optimal due to an excessive volume of blood received in culture bottles Performed at [place] [address], [place], [place] [address]    Culture   Final    NO GROWTH 5 DAYS Performed at [facility], [place] [address], [place], [place] [address]    Report Status 06/06/2020 FINAL  Final  Culture, blood (routine x 2)     Status: None   Collection Time: 06/01/20 11:47 AM   Specimen: BLOOD  Result Value Ref Range Status   Specimen Description   Final    BLOOD LEFT ANTECUBITAL Performed at [place] [address] [place], [place] [address]    Special Requests   Final    BOTTLES DRAWN AEROBIC AND ANAEROBIC Blood Culture results may not be optimal due to an excessive volume of blood received in culture bottles Performed at [facility] [address], [place], [place] [address]    Culture   Final  NO GROWTH 5 DAYS Performed at [facility], [place] [address], [place], [place] [address]    Report Status 06/06/2020 FINAL  Final  MRSA PCR Screening     Status: None   Collection Time: 06/02/20  7:00 AM   Specimen: Nasopharyngeal  Result Value Ref Range Status   MRSA by PCR NEGATIVE NEGATIVE Final    Comment:        The GeneXpert MRSA Assay (FDA approved for NASAL specimens only), is one component of a comprehensive MRSA colonization surveillance program. It is not intended to diagnose MRSA infection nor to guide or monitor treatment for MRSA infections. Performed at [facility], [place] [address] [place], [place] [address]      Labs: BNP (last 3 results) Recent Labs    06/04/20 0832  BNP 0,623.7*   Basic Metabolic Panel: Recent Labs  Lab 06/05/20 0253 06/05/20 0253 06/06/20 0510 06/06/20 0510 06/07/20 0239 06/08/20 0324 06/08/20 1147 06/08/20 1154 06/09/20 0545  NA 132*   < > 136   < > 137 136 140 139 138  K 4.4   < > 5.0   < > 4.4 4.3 4.3 4.3 4.3  CL 102  --  108  --  108 107  --   --  107  CO2 19*  --  20*  --  22 21*  --   --  23  GLUCOSE 160*  --  232*  --  218* 254*  --   --  162*  BUN 47*  --  43*  --  37* 34*  --   --  30*  CREATININE 2.49*  --  1.98*  --  1.69* 1.62*  --   --  1.70*  CALCIUM 8.8*  --  8.7*  --  8.3* 8.3*  --   --  8.4*   < > = values in this interval not displayed.   Liver Function Tests: No results for input(s): AST, ALT, ALKPHOS, BILITOT, PROT, ALBUMIN in the last 168 hours. No results for input(s): LIPASE, AMYLASE in the last 168 hours. No results for input(s):  AMMONIA in the last 168 hours. CBC: Recent Labs  Lab 06/04/20 0832 06/04/20 0832 06/05/20 0253 06/05/20 0253 06/06/20 0510 06/08/20 0324 06/08/20 1147 06/08/20 1154 06/09/20 0545  WBC 5.9  --  5.3  --  4.4 4.2  --   --  4.6  HGB 9.8*   < > 9.2*   < > 9.1* 8.6* 8.8* 8.8* 8.5*  HCT 31.1*   < > 28.5*   < > 29.1* 27.2* 26.0* 26.0* 27.4*  MCV 81.2  --  80.5  --  79.9* 82.2  --   --  81.5  PLT 166  --  162  --  150 142*  --   --  136*   < > = values in this interval not displayed.   Cardiac Enzymes: No results for input(s): CKTOTAL, CKMB, CKMBINDEX, TROPONINI in the last 168 hours. BNP: Invalid input(s): POCBNP CBG: Recent Labs  Lab 06/09/20 1128 06/09/20 1616 06/09/20 2154 06/10/20 0627 06/10/20 1114  GLUCAP 119* 103* 105* 105* 190*   D-Dimer No results for input(s): DDIMER in the last 72 hours. Hgb A1c No results for input(s): HGBA1C in the last 72 hours. Lipid Profile No results for input(s): CHOL, HDL, LDLCALC, TRIG, CHOLHDL, LDLDIRECT in the last 72 hours. Thyroid function studies No results for input(s): TSH, T4TOTAL, T3FREE, THYROIDAB in the last 72 hours.  Invalid input(s):  FREET3 Anemia work up No results for input(s): VITAMINB12, FOLATE, FERRITIN, TIBC, IRON, RETICCTPCT in the last 72 hours. Urinalysis    Component Value Date/Time   COLORURINE AMBER BIOCHEMICALS MAY BE AFFECTED BY COLOR (A) 07/11/2007 [place] 07/11/2007 1851   LABSPEC 1.024 07/11/2007 1851   PHURINE 5.5 07/11/2007 1851   GLUCOSEU 500 (A) 07/11/2007 1851   HGBUR NEGATIVE 07/11/2007 1851   BILIRUBINUR SMALL (A) 07/11/2007 1851   KETONESUR NEGATIVE 07/11/2007 1851   PROTEINUR 30 (A) 07/11/2007 1851   UROBILINOGEN 1.0 07/11/2007 1851   NITRITE NEGATIVE 07/11/2007 1851   LEUKOCYTESUR TRACE (A) 07/11/2007 1851   Sepsis Labs Invalid input(s): PROCALCITONIN,  WBC,  LACTICIDVEN Microbiology Recent Results (from the past 240 hour(s))  Respiratory Panel by RT PCR (Flu A&B,  Covid) - Nasopharyngeal Swab     Status: None   Collection Time: 06/01/20 11:02 AM   Specimen: Nasopharyngeal Swab  Result Value Ref Range Status   SARS Coronavirus 2 by RT PCR NEGATIVE NEGATIVE Final    Comment: (NOTE) SARS-CoV-2 target nucleic acids are NOT DETECTED.  The SARS-CoV-2 RNA is generally detectable in upper respiratoy specimens during the acute phase of infection. The lowest concentration of SARS-CoV-2 viral copies this assay can detect is 131 copies/mL. A negative result does not preclude SARS-Cov-2 infection and should not be used as the sole basis for treatment or other patient management decisions. A negative result may occur with  improper specimen collection/handling, submission of specimen other than nasopharyngeal swab, presence of viral mutation(s) within the areas targeted by this assay, and inadequate number of viral copies (<131 copies/mL). A negative result must be combined with clinical observations, patient history, and epidemiological information. The expected result is Negative.  Fact Sheet for Patients:  PinkCheek.be  Fact Sheet for Healthcare Providers:  GravelBags.it  This test is no t yet approved or cleared by the [place] FDA and  has been authorized for detection and/or diagnosis of SARS-CoV-2 by FDA under an Emergency Use Authorization (EUA). This EUA will remain  in effect (meaning this test can be used) for the duration of the COVID-19 declaration under Section 564(b)(1) of the Act, 21 U.S.C. section 360bbb-3(b)(1), unless the authorization is terminated or revoked sooner.     Influenza A by PCR NEGATIVE NEGATIVE Final   Influenza B by PCR NEGATIVE NEGATIVE Final    Comment: (NOTE) The Xpert Xpress SARS-CoV-2/FLU/RSV assay is intended as an aid in  the diagnosis of influenza from Nasopharyngeal swab specimens and  should not be used as a sole basis for treatment. Nasal  washings and  aspirates are unacceptable for Xpert Xpress SARS-CoV-2/FLU/RSV  testing.  Fact Sheet for Patients: PinkCheek.be  Fact Sheet for Healthcare Providers: GravelBags.it  This test is not yet approved or cleared by the [place] FDA and  has been authorized for detection and/or diagnosis of SARS-CoV-2 by  FDA under an Emergency Use Authorization (EUA). This EUA will remain  in effect (meaning this test can be used) for the duration of the  Covid-19 declaration under Section 564(b)(1) of the Act, 21  U.S.C. section 360bbb-3(b)(1), unless the authorization is  terminated or revoked. Performed at [facility], [place] [address] [place], [place] [address]   Culture, blood (routine x 2)     Status: None   Collection Time: 06/01/20 11:47 AM   Specimen: BLOOD  Result Value Ref Range Status   Specimen Description   Final    BLOOD RIGHT ANTECUBITAL Performed  at [facility], [place] [address] [place], [place] [address]    Special Requests   Final    BOTTLES DRAWN AEROBIC AND ANAEROBIC Blood Culture results may not be optimal due to an excessive volume of blood received in culture bottles Performed at [place] [address], [place], [place] [address]    Culture   Final    NO GROWTH 5 DAYS Performed at [facility], [place] [address], [place], [place] [address]    Report Status 06/06/2020 FINAL  Final  Culture, blood (routine x 2)     Status: None   Collection Time: 06/01/20 11:47 AM   Specimen: BLOOD  Result Value Ref Range Status   Specimen Description   Final    BLOOD LEFT ANTECUBITAL Performed at [place] [address] [place], [place] [address]    Special Requests   Final    BOTTLES DRAWN AEROBIC AND ANAEROBIC Blood Culture results may not be optimal due to an excessive volume of blood received in culture  bottles Performed at [facility] [place] [address], [place], [place] [address]    Culture   Final    NO GROWTH 5 DAYS Performed at [facility], [place] [address] [place], [place] [address]    Report Status 06/06/2020 FINAL  Final  MRSA PCR Screening     Status: None   Collection Time: 06/02/20  7:00 AM   Specimen: Nasopharyngeal  Result Value Ref Range Status   MRSA by PCR NEGATIVE NEGATIVE Final    Comment:        The GeneXpert MRSA Assay (FDA approved for NASAL specimens only), is one component of a comprehensive MRSA colonization surveillance program. It is not intended to diagnose MRSA infection nor to guide or monitor treatment for MRSA infections. Performed at [facility], [place] [address], [place], [place] [address]      Time coordinating discharge: Over 30 minutes  SIGNED:   Desiree Hane, MD  [facility] 06/10/2020, 2:16 PM Pager   If 7PM-7AM, please contact night-coverage [email or web address] Password [email or web address]

## 2020-06-10 NOTE — Progress Notes (Signed)
Inpatient Rehabilitation-Admissions Coordinator   Received insurance approval and medical approval from Dr. Lonny Prude for admit to CIR today. Notified pt of bed offer and he has agreed. RN and [facility] team notified of plan for today. Consent forms and insurance letter reviewed. All questions answered.   Raechel Ache, OTR/L  Rehab Admissions Coordinator  [phone] 06/10/2020 1:07 PM

## 2020-06-10 NOTE — Progress Notes (Signed)
Patient arrived on the unit in wheelchair. A&O x4, denied pain at the time. Assessment done vitals stable. Patient educated on the POC and safety plan. Call bell within reach and bed in low position. We continue to monitor.

## 2020-06-11 ENCOUNTER — Inpatient Hospital Stay (HOSPITAL_COMMUNITY): Payer: BC Managed Care – PPO | Admitting: Occupational Therapy

## 2020-06-11 ENCOUNTER — Inpatient Hospital Stay (HOSPITAL_COMMUNITY): Payer: BC Managed Care – PPO

## 2020-06-11 DIAGNOSIS — N1832 Chronic kidney disease, stage 3b: Secondary | ICD-10-CM

## 2020-06-11 DIAGNOSIS — Z89512 Acquired absence of left leg below knee: Secondary | ICD-10-CM

## 2020-06-11 DIAGNOSIS — M869 Osteomyelitis, unspecified: Secondary | ICD-10-CM

## 2020-06-11 DIAGNOSIS — E119 Type 2 diabetes mellitus without complications: Secondary | ICD-10-CM

## 2020-06-11 LAB — CBC WITH DIFFERENTIAL/PLATELET
Abs Immature Granulocytes: 0.07 10*3/uL (ref 0.00–0.07)
Basophils Absolute: 0 10*3/uL (ref 0.0–0.1)
Basophils Relative: 0 %
Eosinophils Absolute: 0.1 10*3/uL (ref 0.0–0.5)
Eosinophils Relative: 2 %
HCT: 26 % — ABNORMAL LOW (ref 39.0–52.0)
Hemoglobin: 8.1 g/dL — ABNORMAL LOW (ref 13.0–17.0)
Immature Granulocytes: 2 %
Lymphocytes Relative: 14 %
Lymphs Abs: 0.5 10*3/uL — ABNORMAL LOW (ref 0.7–4.0)
MCH: 25.2 pg — ABNORMAL LOW (ref 26.0–34.0)
MCHC: 31.2 g/dL (ref 30.0–36.0)
MCV: 81 fL (ref 80.0–100.0)
Monocytes Absolute: 0.4 10*3/uL (ref 0.1–1.0)
Monocytes Relative: 10 %
Neutro Abs: 2.9 10*3/uL (ref 1.7–7.7)
Neutrophils Relative %: 72 %
Platelets: 131 10*3/uL — ABNORMAL LOW (ref 150–400)
RBC: 3.21 MIL/uL — ABNORMAL LOW (ref 4.22–5.81)
RDW: 14.4 % (ref 11.5–15.5)
WBC: 4 10*3/uL (ref 4.0–10.5)
nRBC: 0 % (ref 0.0–0.2)

## 2020-06-11 LAB — COMPREHENSIVE METABOLIC PANEL
ALT: 25 U/L (ref 0–44)
AST: 18 U/L (ref 15–41)
Albumin: 2.5 g/dL — ABNORMAL LOW (ref 3.5–5.0)
Alkaline Phosphatase: 86 U/L (ref 38–126)
Anion gap: 12 (ref 5–15)
BUN: 37 mg/dL — ABNORMAL HIGH (ref 8–23)
CO2: 19 mmol/L — ABNORMAL LOW (ref 22–32)
Calcium: 8.3 mg/dL — ABNORMAL LOW (ref 8.9–10.3)
Chloride: 106 mmol/L (ref 98–111)
Creatinine, Ser: 1.95 mg/dL — ABNORMAL HIGH (ref 0.61–1.24)
GFR, Estimated: 38 mL/min — ABNORMAL LOW (ref >60)
Glucose, Bld: 78 mg/dL (ref 70–99)
Potassium: 4 mmol/L (ref 3.5–5.1)
Sodium: 137 mmol/L (ref 135–145)
Total Bilirubin: 0.9 mg/dL (ref 0.3–1.2)
Total Protein: 5.5 g/dL — ABNORMAL LOW (ref 6.5–8.1)

## 2020-06-11 LAB — GLUCOSE, CAPILLARY
Glucose-Capillary: 65 mg/dL — ABNORMAL LOW (ref 70–99)
Glucose-Capillary: 79 mg/dL (ref 70–99)
Glucose-Capillary: 200 mg/dL — ABNORMAL HIGH (ref 70–99)
Glucose-Capillary: 87 mg/dL (ref 70–99)
Glucose-Capillary: 64 mg/dL — ABNORMAL LOW (ref 70–99)
Glucose-Capillary: 67 mg/dL — ABNORMAL LOW (ref 70–99)
Glucose-Capillary: 72 mg/dL (ref 70–99)

## 2020-06-11 MED ORDER — GLUCOSE 40 % PO GEL
1.0000 | ORAL | Status: AC
  Filled 2020-06-11: qty 1

## 2020-06-11 NOTE — Evaluation (Signed)
Physical Therapy Assessment and Plan  Patient Details  Name: Steven [name] MRN: [id number] Date of Birth: [birth year]  PT Diagnosis: Difficulty walking and Muscle weakness Rehab Potential: Good ELOS: 11-14 days   Today's Date: 06/11/2020 PT Individual Time: 0803-0900 PT Individual Time Calculation (min): 57 min    Hospital Problem: Principal Problem:   S/P BKA (below knee amputation) unilateral, left ([place]) Active Problems:   Below-knee amputation of left lower extremity ([place])   Past Medical History:  Past Medical History:  Diagnosis Date  . Cataract    removed by surgery  . Diabetes ([place])    type 2  . Diabetic glomerulosclerosis [facility])    [facility] 03/21/12 by Dr. Corliss Parish.  . GERD (gastroesophageal reflux disease)   . HLD (hyperlipidemia)   . Hyperparathyroidism ([place])   . Hypertension    Patient denies this DX, no meds  . Kidney transplant recipient   . Obesity   . Sleep apnea    uses CPAP nightly  . Venous insufficiency   . Vitamin D deficiency    Past Surgical History:  Past Surgical History:  Procedure Laterality Date  . AMPUTATION Right 07/19/2017   Procedure: AMPUTATION RIGHT 2ND TOE;  Surgeon: Leandrew Koyanagi, MD;  Location: [place];  Service: Orthopedics;  Laterality: Right;  . AMPUTATION Left 06/03/2020   Procedure: LEFT BELOW KNEE AMPUTATION;  Surgeon: Newt Minion, MD;  Location: [place];  Service: Orthopedics;  Laterality: Left;  . COLONOSCOPY     greater than 10 yrs ago  . EYE SURGERY Bilateral    cataracts removed  . KIDNEY TRANSPLANT  2006  . NEPHRECTOMY TRANSPLANTED ORGAN  2006  . RIGHT HEART CATH AND CORONARY ANGIOGRAPHY N/A 06/08/2020   Procedure: RIGHT HEART CATH AND CORONARY ANGIOGRAPHY;  Surgeon: Nelva Bush, MD;  Location: [place] [facility];  Service: Cardiovascular;  Laterality: N/A;  . UPPER GASTROINTESTINAL ENDOSCOPY    . WISDOM TOOTH EXTRACTION      Assessment & Plan Clinical Impression: Patient is a  64 year old male with history of T2DM, OSA, HTN, renal transplant 2006 who was admitted on 06/01/2020 with worsening left foot ulcer, despite abx. History taken from chart review and patient. 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 Sharol Given.  On 06/04/2020 he developed hypoxia and dyspnea as well as chest pressure due to pulmonary edema. He was treated with IV diuresis. 2D echocardiogram showed severely calcified aortic and mitral valves with severe aortic stenosis, ejection fraction of 60-65% with moderate concentric left ventricular hypertrophy.  Dr. Burt Knack consulted for input on severe AS and patient underwent cardiac cath for evaluation of potential TVAR. 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 Rest of TVAR work up to be done on outpatient basis and recommendations are to carefully escalate diuresis if renal function remains stable.   Blood sugars have been poorly controlled. Acute on chronic renal failure improving with BUN/SCr 47/2.49-->30/1.7. Acute blood loss anemia stable. Platelets trending down. Wound VAC was DC'd. Therapy ongoing and patient limited by left elbow pain, weakness as well as SOB with activity.  Patient transferred to CIR on 06/10/2020 .   Patient currently requires mod with mobility secondary to muscle weakness, decreased cardiorespiratoy endurance and decreased sitting balance, decreased standing balance and decreased balance strategies.  Prior to hospitalization, patient was independent  with mobility and  lived with Spouse, Family (3 children at home in late teen and early 50s) in a House home.  Home access is 1 threshold (1.5" high)Stairs to enter.  Patient will benefit from skilled PT intervention to maximize safe functional mobility, minimize fall risk and decrease caregiver burden for planned discharge home with  intermittent assist.  Anticipate patient will benefit from follow up [facility] at discharge.  PT - End of Session Activity Tolerance: Tolerates 30+ min activity with multiple rests Endurance Deficit: Yes Endurance Deficit Description: Multiple rest breaks required PT Assessment Rehab Potential (ACUTE/IP ONLY): Good PT Barriers to Discharge: Wound Care;Weight bearing restrictions PT Patient demonstrates impairments in the following area(s): Balance;Endurance;Motor;Pain;Safety;Skin Integrity PT Transfers Functional Problem(s): Bed Mobility;Bed to Chair;Car PT Locomotion Functional Problem(s): Ambulation;Wheelchair Mobility;Stairs PT Plan PT Intensity: Minimum of 1-2 x/day ,45 to 90 minutes PT Frequency: 5 out of 7 days PT Duration Estimated Length of Stay: 11-14 days PT Treatment/Interventions: Ambulation/gait training;Cognitive remediation/compensation;Discharge planning;DME/adaptive equipment instruction;Functional mobility training;Pain management;Psychosocial support;Splinting/orthotics;Therapeutic Activities;UE/LE Strength taining/ROM;Visual/perceptual remediation/compensation;Balance/vestibular training;Community reintegration;Disease management/prevention;Neuromuscular re-education;Functional electrical stimulation;Patient/family education;Skin care/wound Landscape architect;Therapeutic Exercise;UE/LE Coordination activities;Wheelchair propulsion/positioning PT Transfers Anticipated Outcome(s): Supervision PT Locomotion Anticipated Outcome(s): Supervision household distances PT Recommendation Follow Up Recommendations: Home health PT Patient destination: Home Equipment Recommended: To be determined   PT Evaluation Precautions/Restrictions Restrictions LLE Weight Bearing: Non weight bearing Home Living/Prior Functioning Home Living Available Help at Discharge: Family;Available 24 hours/day Type of Home: House Home Access: Stairs to enter [employer] of Steps: 1  threshold (1.5" high)  Lives With: Spouse;Family (3 children at home in late teen and early 83s) Prior Function Level of Independence: Independent with homemaking with ambulation;Independent with transfers;Independent with gait  Able to Take Stairs?: Yes Driving: Yes Vocation: Retired Comments: Very active, teaches paramedic classes. Likes hunting, going to ball games. Retired Civil engineer, contracting: Within [place]: Intact  Cognition Overall Cognitive Status: Within Functional Limits for tasks assessed Safety/Judgment: Appears intact Sensation Sensation Light Touch: Appears Intact Coordination Gross Motor Movements are Fluid and Coordinated: Yes Fine Motor Movements are Fluid and Coordinated: Yes Motor  Motor Motor: Within Functional Limits  Trunk/Postural Assessment  Cervical Assessment Cervical Assessment:  (forward head) Thoracic Assessment Thoracic Assessment:  (rounded shoulders) Lumbar Assessment Lumbar Assessment:  (posterior pelvic tilt) Postural Control Postural Control: Within Functional Limits  Balance Balance Balance Assessed: Yes Static Sitting Balance Static Sitting - Level of Assistance: 5: Stand by assistance Dynamic Sitting Balance Dynamic Sitting - Balance Support: Bilateral upper extremity supported Dynamic Sitting - Level of Assistance: 5: Stand by assistance Static Standing Balance Static Standing - Balance Support: Bilateral upper extremity supported Static Standing - Level of Assistance: 4: Min assist Dynamic Standing Balance Dynamic Standing - Balance Support: Bilateral upper extremity supported Dynamic Standing - Level of Assistance: 4: Min assist Extremity Assessment  RLE Assessment RLE Assessment: Within Functional Limits LLE Assessment General Strength Comments: Not tested due to [place] Tool Bed Mobility Roll left and right activity   Roll left and right assist  level: Supervision/Verbal cueing    Sit to lying activity   Sit to lying assist level: Minimal Assistance - Patient > 75%    Lying to sitting edge of bed activity   Lying to sitting edge of bed assist level: Moderate Assistance - Patient 50 - 74%     Care Tool Transfers Sit to stand transfer   Sit to stand assist level: Moderate Assistance - Patient 50 - 74%    Chair/bed  transfer   Chair/bed transfer assist level: Moderate Assistance - Patient 50 - 74%     Toilet transfer   Assist Level: Moderate Assistance - Patient 50 - 74%    Car transfer   Car transfer assist level: Moderate Assistance - Patient 50 - 74%      Care Tool Locomotion Ambulation   Assist level: Minimal Assistance - Patient > 75% Assistive device: Walker-rolling Max distance: 7'  Walk 10 feet activity Walk 10 feet activity did not occur: Safety/medical concerns       Walk 50 feet with 2 turns activity Walk 50 feet with 2 turns activity did not occur: Safety/medical concerns      Walk 150 feet activity Walk 150 feet activity did not occur: Safety/medical concerns      Walk 10 feet on uneven surfaces activity Walk 10 feet on uneven surfaces activity did not occur: Safety/medical concerns      Stairs Stair activity did not occur: Safety/medical concerns        Walk up/down 1 step activity Walk up/down 1 step or curb (drop down) activity did not occur: Safety/medical concerns     Walk up/down 4 steps activity did not occuR: Safety/medical concerns  Walk up/down 4 steps activity      Walk up/down 12 steps activity Walk up/down 12 steps activity did not occur: Safety/medical concerns      Pick up small objects from floor Pick up small object from the floor (from standing position) activity did not occur: Safety/medical concerns      Wheelchair Will patient use wheelchair at discharge?: Yes Type of Wheelchair: Manual Wheelchair activity did not occur: Safety/medical concerns      Wheel 50 feet  with 2 turns activity Wheelchair 50 feet with 2 turns activity did not occur: Safety/medical concerns    Wheel 150 feet activity Wheelchair 150 feet activity did not occur: Safety/medical concerns      Refer to Care Plan for Long Term Goals  SHORT TERM GOAL WEEK 1 PT Short Term Goal 1 (Week 1): Pt will perform bed mobility with minA PT Short Term Goal 2 (Week 1): Pt will perform bed to chair transfer with minA. PT Short Term Goal 3 (Week 1): Pt will ambulate x25' with minA.  Recommendations for other services: None   Skilled Therapeutic Intervention  Evaluation completed (see details above and below) with education on PT POC and goals and individual treatment initiated with focus on bed mobility, balance, transfers, and initiation of gait training.  Pt received supine in bed and agrees to therapy. No pain at rest but mentions that L elbow (dominant arm), has bursitis and is difficulty to utilize in certain positions. PT avoids aggravating movements during session to manage pain symptoms. PT provides pt with manual WC with L elevating leg rest and seat cushion. Supine to sit with modA and cues for sequencing and mechanics. PT educates pt on head hips relationship. Pt performs squat pivot to WC with modA. WC transport to gym for time management. Pt performs stand pivot transfer to/from car with modA and cues on hand placement and sequencing. Pt performs sit to stand with modA to RW, and ambulates 7' with minA. Pt has very short hop lengths and PT cues on increased plantarflexion to facilitate efficiency, and maintaining upright gaze to improve posture and balance. Pt left seated in WC with alarm intact and all needs within reach.  Mobility Bed Mobility Bed Mobility: Supine to Sit;Sit to Supine Supine to Sit: Moderate  Assistance - Patient 50-74% Sit to Supine: Minimal Assistance - Patient > 75% Transfers Transfers: Sit to Stand;Stand to Sit;Stand Pivot Transfers;Squat Pivot Transfers Sit to  Stand: Moderate Assistance - Patient 50-74% Stand to Sit: Moderate Assistance - Patient 50-74% Stand Pivot Transfers: Moderate Assistance - Patient 50 - 74% Stand Pivot Transfer Details: Verbal cues for safe use of DME/AE;Verbal cues for precautions/safety;Verbal cues for sequencing;Tactile cues for posture Squat Pivot Transfers: Moderate Assistance - Patient 50-74% Transfer (Assistive device): Rolling walker Locomotion  Gait Ambulation: Yes Gait Assistance: Minimal Assistance - Patient > 75% Gait Distance (Feet): 7 Feet Assistive device: Rolling walker Gait Assistance Details: Verbal cues for sequencing;Tactile cues for posture;Verbal cues for safe use of DME/AE;Verbal cues for technique Gait Gait: Yes Gait Pattern: Impaired (Very short hops) Gait velocity: Decreased Stairs / Additional Locomotion Stairs: No Wheelchair Mobility Wheelchair Mobility: No   Discharge Criteria: Patient will be discharged from PT if patient refuses treatment 3 consecutive times without medical reason, if treatment goals not met, if there is a change in medical status, if patient makes no progress towards goals or if patient is discharged from hospital.  The above assessment, treatment plan, treatment alternatives and goals were discussed and mutually agreed upon: by patient  [name], PT, DPT 06/11/2020, 4:03 PM

## 2020-06-11 NOTE — Progress Notes (Signed)
Hypoglycemic Event  CBG: 64  Treatment: 1 tube glucose gel was refused.  Patient requested graham crackers, peanut butter and milk.   Symptoms: None    Follow-up CBG: Time:2207 CBG Result:67  Follow-up CBG: Time:2226 CBG Result:72  Possible Reasons for Event: Inadequate meal intake. States "I usually have a a white cake with dinner but I didn't order it today."  Comments/MD notified: Will notify in am per protocol.      Burke Keels

## 2020-06-11 NOTE — Progress Notes (Signed)
Orthopedic Tech Progress Note Patient Details:  Steven [name] 04-25-1956 [id number] Called order into [facility] Patient ID: Steven [name], male   DOB: 21-May-1956, 64 y.o.   MRN: [id number]   Steven [name] 06/11/2020, 6:10 PM

## 2020-06-11 NOTE — Evaluation (Signed)
Occupational Therapy Assessment and Plan  Patient Details  Name: Steven [name] MRN: [id number] Date of Birth: 09-13-1955  OT Diagnosis: abnormal posture, acute pain and swelling of limb Rehab Potential: Rehab Potential (ACUTE ONLY): Good ELOS: 11-14 days   Today's Date: 06/11/2020 OT Individual Time: 1330-1430 OT Individual Time Calculation (min): 60 min     Hospital Problem: Principal Problem:   S/P BKA (below knee amputation) unilateral, left ([place]) Active Problems:   Below-knee amputation of left lower extremity ([place])   Past Medical History:  Past Medical History:  Diagnosis Date  . Cataract    removed by surgery  . Diabetes ([place])    type 2  . Diabetic glomerulosclerosis [facility])    [facility] 03/21/12 by Dr. Corliss Parish.  . GERD (gastroesophageal reflux disease)   . HLD (hyperlipidemia)   . Hyperparathyroidism ([place])   . Hypertension    Patient denies this DX, no meds  . Kidney transplant recipient   . Obesity   . Sleep apnea    uses CPAP nightly  . Venous insufficiency   . Vitamin D deficiency    Past Surgical History:  Past Surgical History:  Procedure Laterality Date  . AMPUTATION Right 07/19/2017   Procedure: AMPUTATION RIGHT 2ND TOE;  Surgeon: Leandrew Koyanagi, MD;  Location: [place];  Service: Orthopedics;  Laterality: Right;  . AMPUTATION Left 06/03/2020   Procedure: LEFT BELOW KNEE AMPUTATION;  Surgeon: Newt Minion, MD;  Location: [place];  Service: Orthopedics;  Laterality: Left;  . COLONOSCOPY     greater than 10 yrs ago  . EYE SURGERY Bilateral    cataracts removed  . KIDNEY TRANSPLANT  2006  . NEPHRECTOMY TRANSPLANTED ORGAN  2006  . RIGHT HEART CATH AND CORONARY ANGIOGRAPHY N/A 06/08/2020   Procedure: RIGHT HEART CATH AND CORONARY ANGIOGRAPHY;  Surgeon: Nelva Bush, MD;  Location: [place] [facility];  Service: Cardiovascular;  Laterality: N/A;  . UPPER GASTROINTESTINAL ENDOSCOPY    . WISDOM TOOTH EXTRACTION       Assessment & Plan Clinical Impression: [name] is a 64 year old male with history of T2DM, OSA, HTN, renal transplant 2006 who was admitted on 06/01/2020 with worsening left foot ulcer, despite abx. History taken from chart review and patient. 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 Sharol Given.  On 06/04/2020 he developed hypoxia and dyspnea as well as chest pressure due to pulmonary edema. He was treated with IV diuresis. 2D echocardiogram showed severely calcified aortic and mitral valves with severe aortic stenosis, ejection fraction of 60-65% with moderate concentric left ventricular hypertrophy.  Dr. Burt Knack consulted for input on severe AS and patient underwent cardiac cath for evaluation of potential TVAR. 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 Rest of TVAR work up to be done on outpatient basis and recommendations are to carefully escalate diuresis if renal function remains stable.   Blood sugars have been poorly controlled. Acute on chronic renal failure improving with BUN/SCr 47/2.49-->30/1.7. Acute blood loss anemia stable. Platelets trending down. Wound VAC was DC'd. Therapy ongoing and patient limited by left elbow pain, weakness as well as SOB with activity. CIR recommended due to functional decline.  Patient transferred to CIR on 06/10/2020 .    Patient currently requires min to mod with basic self-care skills secondary to muscle weakness and muscle joint tightness  and decreased sitting balance, decreased standing balance, decreased postural control and decreased balance strategies.  Prior to hospitalization, patient could complete ADLs and IADLs with independent .  Patient will benefit from skilled intervention to decrease level of assist with basic self-care skills prior to discharge home with care partner.  Anticipate  patient will require intermittent supervision and follow up home health.  OT - End of Session Activity Tolerance: Tolerates 30+ min activity with multiple rests Endurance Deficit: Yes Endurance Deficit Description: Multiple rest breaks required OT Assessment Rehab Potential (ACUTE ONLY): Good OT Patient demonstrates impairments in the following area(s): Balance;Safety;Edema;Motor;Pain;Skin Integrity OT Basic ADL's Functional Problem(s): Toileting;Dressing;Bathing OT Transfers Functional Problem(s): Toilet;Tub/Shower OT Additional Impairment(s): None OT Plan OT Intensity: Minimum of 1-2 x/day, 45 to 90 minutes OT Frequency: 5 out of 7 days OT Duration/Estimated Length of Stay: 11-14 days OT Treatment/Interventions: Balance/vestibular training;Discharge planning;Pain management;Self Care/advanced ADL retraining;Therapeutic Activities;UE/LE Coordination activities;Disease mangement/prevention;Functional mobility training;Patient/family education;Skin care/wound managment;Therapeutic Exercise;DME/adaptive equipment instruction;Psychosocial support;Neuromuscular re-education;UE/LE Strength taining/ROM;Wheelchair propulsion/positioning OT Basic Self-Care Anticipated Outcome(s): setup-supervision OT Toileting Anticipated Outcome(s): supervision OT Bathroom Transfers Anticipated Outcome(s): supervision OT Recommendation Patient destination: Home Follow Up Recommendations: Home health OT Equipment Recommended: To be determined   OT Evaluation Precautions/Restrictions  Precautions Precautions: Fall Precaution Comments: L BKA wound back; watch SpO2/HR Required Braces or Orthoses: Other Brace Other Brace: limb protector Restrictions Weight Bearing Restrictions: Yes LLE Weight Bearing: Non weight bearing General Chart Reviewed: Yes Vital Signs Therapy Vitals Temp: 97.6 F (36.4 C) Temp Source: Oral Pulse Rate: 88 Resp: 16 BP: 137/70 Oxygen Therapy SpO2: 100 % O2 Device: Room Air  Pain Pain Assessment Pain Scale: Faces Faces Pain Scale: Hurts a little bit Pain Type: Acute pain Pain Location: Leg Pain Orientation: Left Pain Descriptors / Indicators: Aching Pain Onset: On-going Pain Intervention(s): Elevated extremity Home Living/Prior Functioning Home Living Family/patient expects to be discharged to:: Private residence Living Arrangements: Alone Available Help at Discharge: Family, Available 24 hours/day Type of Home: House Home Access: Stairs to enter [employer] of Steps: 1 threshold (1.5" high) Entrance Stairs-Rails: None Home Layout: One level Bathroom Shower/Tub: Multimedia programmer: Handicapped height Bathroom Accessibility: Yes  Lives With: Spouse, Family Prior Function Level of Independence: Independent with homemaking with ambulation, Independent with transfers, Independent with gait, Independent with basic ADLs  Able to Take Stairs?: Yes Driving: Yes Vocation: Retired Comments: Very active, teaches paramedic classes. Likes hunting, going to ball games. Retired Diplomatic Services operational officer Baseline Vision/History: No visual deficits Patient Visual Report: No change from baseline Vision Assessment?: No apparent visual deficits Perception  Perception: Within Functional Limits Praxis Praxis: Intact Cognition Overall Cognitive Status: Within Functional Limits for tasks assessed Arousal/Alertness: Awake/alert Orientation Level: Person;Place;Situation Person: Oriented Place: Oriented Situation: Oriented Year: 2021 Month: November Day of Week: Correct Memory: Appears intact Immediate Memory Recall: Sock;Blue;Bed Memory Recall Sock: Without Cue Memory Recall Blue: Without Cue Memory Recall Bed: Without Cue Attention: Sustained Sustained Attention: Appears intact Awareness: Appears intact Problem Solving: Appears intact Safety/Judgment: Appears intact Sensation Sensation Light Touch: Appears Intact Hot/Cold: Appears  Intact Proprioception: Appears Intact Stereognosis: Not tested Coordination Gross Motor Movements are Fluid and Coordinated: Yes Fine Motor Movements are Fluid and Coordinated: Yes Motor  Motor Motor: Within Functional Limits  Trunk/Postural Assessment  Cervical Assessment Cervical Assessment:  (forward head) Thoracic Assessment Thoracic Assessment:  (rounded shoulders) Lumbar Assessment Lumbar Assessment:  (mild posterior pelvic tilt) Postural Control Postural Control: Within Functional Limits  Balance Balance Balance Assessed: Yes Static Sitting Balance Static Sitting -  Level of Assistance: 5: Stand by assistance Dynamic Sitting Balance Dynamic Sitting - Balance Support: Bilateral upper extremity supported Dynamic Sitting - Level of Assistance: 5: Stand by assistance Static Standing Balance Static Standing - Balance Support: Bilateral upper extremity supported Static Standing - Level of Assistance: 4: Min assist Dynamic Standing Balance Dynamic Standing - Balance Support: Bilateral upper extremity supported Dynamic Standing - Level of Assistance: 4: Min assist Extremity/Trunk Assessment RUE Assessment RUE Assessment: Within Functional Limits LUE Assessment Passive Range of Motion (PROM) Comments: Shoulder, wrist, hand WNL; elbow (15/120) Active Range of Motion (AROM) Comments: shoulder, wrist, hand WNL; elbow 30/120 General Strength Comments: 4-/5 primarily due to pain in elbow  Care Tool Care Tool Self Care Eating   Eating Assist Level: Set up assist    Oral Care    Oral Care Assist Level: Set up assist    Bathing   Body parts bathed by patient: Right arm;Left arm;Chest;Abdomen;Front perineal area;Buttocks;Right upper leg;Left upper leg;Right lower leg;Face     Assist Level: Minimal Assistance - Patient > 75% (for sit <>stand to bathe buttocks at sink)    Upper Body Dressing(including orthotics)   What is the patient wearing?: Pull over shirt   Assist  Level: Set up assist    Lower Body Dressing (excluding footwear)   What is the patient wearing?: Pants Assist for lower body dressing: Moderate Assistance - Patient 50 - 74%    Putting on/Taking off footwear   What is the patient wearing?: Non-skid slipper socks Assist for footwear: Moderate Assistance - Patient 50 - 74%       Care Tool Toileting Toileting activity         Care Tool Bed Mobility Roll left and right activity   Roll left and right assist level: Supervision/Verbal cueing    Sit to lying activity   Sit to lying assist level: Minimal Assistance - Patient > 75%    Lying to sitting edge of bed activity   Lying to sitting edge of bed assist level: Moderate Assistance - Patient 50 - 74%     Care Tool Transfers Sit to stand transfer   Sit to stand assist level: Moderate Assistance - Patient 50 - 74%    Chair/bed transfer   Chair/bed transfer assist level: Moderate Assistance - Patient 50 - 74%     Toilet transfer   Assist Level: Moderate Assistance - Patient 50 - 74%     Care Tool Cognition Expression of Ideas and Wants Expression of Ideas and Wants: Without difficulty (complex and basic) - expresses complex messages without difficulty and with speech that is clear and easy to understand   Understanding Verbal and Non-Verbal Content Understanding Verbal and Non-Verbal Content: Understands (complex and basic) - clear comprehension without cues or repetitions   Memory/Recall Ability *first 3 days only Memory/Recall Ability *first 3 days only: Current season;Location of own room;Staff names and faces;That he or she is in a hospital/hospital unit    Refer to Care Plan for [place] 1 OT Short Term Goal 1 (Week 1): Pt will complete squat pivot transfer w/c<>drop arm commode with CGA OT Short Term Goal 2 (Week 1): Pt will complete LB dressing with CGA OT Short Term Goal 3 (Week 1): Pt will complete UB/LB bathing with CGA OT Short Term  Goal 4 (Week 1): Pt will achieve 0 degrees left elbow passive and active extension.  Recommendations for other services: None    Skilled Therapeutic Intervention ADL  ADL Grooming: Setup Where Assessed-Grooming: Sitting at sink Upper Body Bathing: Setup Where Assessed-Upper Body Bathing: Sitting at sink Lower Body Bathing: Minimal assistance Where Assessed-Lower Body Bathing: Sitting at sink;Standing at sink Upper Body Dressing: Setup Where Assessed-Upper Body Dressing: Sitting at sink Where Assessed-Lower Body Dressing: Sitting at sink;Standing at sink Toilet Transfer Method: Squat pivot Toilet Transfer Equipment: Drop arm bedside commode Mobility  Bed Mobility Bed Mobility: Supine to Sit;Sit to Supine Supine to Sit: Moderate Assistance - Patient 50-74% Sit to Supine: Minimal Assistance - Patient > 75% Transfers Sit to Stand: Minimal Assistance - Patient > 75% Stand to Sit: Minimal Assistance - Patient > 75%   Skilled Intervention:  Pt sitting up in w/c, agreeable to OT session.  Initial evaluation completed, educated pt regarding OT scope of practice, and collaborated with pt regarding POC.  Pt completed sinkside self care and functional transfers per above levels of assist needed.  Pt returned to w/c at end of session with call bell in reach, seat belt alarm on.     Discharge Criteria: Patient will be discharged from OT if patient refuses treatment 3 consecutive times without medical reason, if treatment goals not met, if there is a change in medical status, if patient makes no progress towards goals or if patient is discharged from hospital.  The above assessment, treatment plan, treatment alternatives and goals were discussed and mutually agreed upon: by patient  Ezekiel Slocumb 06/11/2020, 4:32 PM

## 2020-06-11 NOTE — Progress Notes (Signed)
Physical Therapy Session Note  Patient Details  Name: Steven [name] MRN: [id number] Date of Birth: [birth year]  Today's Date: 06/11/2020 PT Individual Time: 1100-1200 PT Individual Time Calculation (min): 60 min   Short Term Goals: Week 1:  PT Short Term Goal 1 (Week 1): Pt will perform bed mobility with minA PT Short Term Goal 2 (Week 1): Pt will perform bed to chair transfer with minA. PT Short Term Goal 3 (Week 1): Pt will ambulate x25' with minA.   Skilled Therapeutic Interventions/Progress Updates:    Pt received sitting in w/c, agreeable to PT session. No reports of pain. Provided him with throw-away t-shirt which he donned with supervision while seated in chair. Pt propelled himself in w/c ~173ft with supervision on level surfaces, cues for increasing B shldr extension to improve stroke efficiency. Performed stand<>pivot transfer with modA and RW from w/c to mat table, cues for BUE placement and general sequencing. At mat table, performed 2x5 sit<>stands with modA to RW, cues for forward weight shift and hand placement due to posterior bias. Performed repeated lateral scoots L<>R along mat table with minA; cues for forward weight shift, hand placement, pelvic translation; pt with improved technique as repetitions progressed. Performed x6 squat<>pivot transfers from mat table to w/c, progressing from [place] to [place], education on head/hip postioning, hand placement, and safety precautions. Pt with good understanding and progressed quite well with these squat pivot transfers. Ended session by performing w/c pushups, 2x5, again emphasizing hip clearance and forward weight shifts. Returned patient to his room with [place] for time management where he requested to remain seated in w/c, safety belt alarm, needs in reach.   Therapy Documentation Precautions:  Precautions Precautions: Fall Precaution Comments: L BKA wound back; watch SpO2/HR Required Braces or Orthoses: Other Brace Other  Brace: limb protector Restrictions Weight Bearing Restrictions: Yes LLE Weight Bearing: Non weight bearing  Therapy/Group: Individual Therapy   Babe Clenney P Soni Kegel PT 06/11/2020, 12:15 PM

## 2020-06-11 NOTE — Progress Notes (Signed)
Patient Details  Name: Steven [name] MRN: [id number] Date of Birth: 06-14-1956  Today's Date: 06/11/2020  Hospital Problems: Principal Problem:   S/P BKA (below knee amputation) unilateral, left ([place]) Active Problems:   Below-knee amputation of left lower extremity [facility])  Past Medical History:  Past Medical History:  Diagnosis Date  . Cataract    removed by surgery  . Diabetes ([place])    type 2  . Diabetic glomerulosclerosis [facility])    [facility] 03/21/12 by Dr. Corliss Parish.  . GERD (gastroesophageal reflux disease)   . HLD (hyperlipidemia)   . Hyperparathyroidism ([place])   . Hypertension    Patient denies this DX, no meds  . Kidney transplant recipient   . Obesity   . Sleep apnea    uses CPAP nightly  . Venous insufficiency   . Vitamin D deficiency    Past Surgical History:  Past Surgical History:  Procedure Laterality Date  . AMPUTATION Right 07/19/2017   Procedure: AMPUTATION RIGHT 2ND TOE;  Surgeon: Leandrew Koyanagi, MD;  Location: [place];  Service: Orthopedics;  Laterality: Right;  . AMPUTATION Left 06/03/2020   Procedure: LEFT BELOW KNEE AMPUTATION;  Surgeon: Newt Minion, MD;  Location: [place];  Service: Orthopedics;  Laterality: Left;  . COLONOSCOPY     greater than 10 yrs ago  . EYE SURGERY Bilateral    cataracts removed  . KIDNEY TRANSPLANT  2006  . NEPHRECTOMY TRANSPLANTED ORGAN  2006  . RIGHT HEART CATH AND CORONARY ANGIOGRAPHY N/Steven 06/08/2020   Procedure: RIGHT HEART CATH AND CORONARY ANGIOGRAPHY;  Surgeon: Nelva Bush, MD;  Location: [place] [facility];  Service: Cardiovascular;  Laterality: N/Steven;  . UPPER GASTROINTESTINAL ENDOSCOPY    . WISDOM TOOTH EXTRACTION     Social History:  reports that he has never smoked. He quit smokeless tobacco use about 22 months ago.  His smokeless tobacco use included chew. He reports that he does not drink alcohol and does not use drugs.  Family / Support Systems Marital Status: Married How  Long?: 24 years; verbally seperated for 10 years Patient Roles: Spouse, Parent Spouse/Significant Other: Steven [name] [phone]) Children: 4 adult children. youngest child is 64 y.o.. Lives in between both homes. Other Supports: chidren and parents Anticipated Caregiver: Wife,adult children, and parents Ability/Limitations of Caregiver: Wife works; parents will be available to assist Caregiver Availability: 24/7 Family Dynamics: Pt lives alone. Cares for their 62 y.o.  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) Return to Work Plans: Pt would like to return to work when able Public relations account executive Issues: Denies Guardian/Conservator: N/Steven   Abuse/Neglect Abuse/Neglect Assessment Can Be Completed: Yes Physical Abuse: Denies Verbal Abuse: Denies Sexual Abuse: Denies Exploitation of patient/patient's resources: Denies Self-Neglect: Denies  Emotional Status Pt's affect, behavior and adjustment status: Pt in good spirits at time of visit Recent Psychosocial Issues: Denies Psychiatric History: Denies Substance Abuse History: Denies  Patient / Family Perceptions, Expectations & Goals Pt/Family understanding of illness & functional limitations: Pt has general understanding of care needs Premorbid pt/family roles/activities: Independent Anticipated changes in roles/activities/participation: Assistance with ADLs/IADLs Pt/family expectations/goals: Pt goal is to "regain strength; and overall functional ability to be independent."  [employer]:  None Premorbid Home Care/DME Agencies:  None Transportation available at discharge: family Resource referrals recommended: Neuropsychology  Discharge Planning Living Arrangements: Alone Support Systems: Spouse/significant other, Children, Parent Type of Residence: Private residence Insurance Resources: Multimedia programmer (specify) Nurse, mental health) Financial Resources: Employment, Fish farm manager, Other (Comment) Financial Screen Referred: No Living Expenses: Medical laboratory scientific officer Management: Patient Does the patient have any problems obtaining your medications?: No Care Coordinator Anticipated Follow Up Needs: HH/OP  Clinical Impression SW met with pt in room to introduce self, explain role, and discuss discharge process. Pt has no HCPOA. DME: RW, CBG meter, grab bars at toilet and tub/shower.   Steven [name] Steven [name] 06/11/2020, 4:18 PM

## 2020-06-11 NOTE — Progress Notes (Signed)
Hypoglycemic Event  CBG: 65  Treatment: 2 cups of OJ given Symptoms: none Follow-up CBG: Time: 0615 CBG Result:79  Possible Reasons for Event: N/A  Comments/MD notified: followed protocol    Steven [name] K Jillienne Egner

## 2020-06-11 NOTE — Progress Notes (Addendum)
PHYSICAL MEDICINE & REHABILITATION PROGRESS NOTE   Subjective/Complaints: Had a quiet night. Having little pain. Did experienced phantom sensations of his left foot and toes cramping  ROS: Patient denies fever, rash, sore throat, blurred vision, nausea, vomiting, diarrhea, cough, shortness of breath or chest pain, joint or back pain, headache, or mood change.    Objective:   No results found. Recent Labs    06/09/20 0545 06/11/20 0023  WBC 4.6 4.0  HGB 8.5* 8.1*  HCT 27.4* 26.0*  PLT 136* 131*   Recent Labs    06/09/20 0545 06/11/20 0023  NA 138 137  K 4.3 4.0  CL 107 106  CO2 23 19*  GLUCOSE 162* 78  BUN 30* 37*  CREATININE 1.70* 1.95*  CALCIUM 8.4* 8.3*    Intake/Output Summary (Last 24 hours) at 06/11/2020 1015 Last data filed at 06/11/2020 0546 Gross per 24 hour  Intake 360 ml  Output 325 ml  Net 35 ml     Pressure Injury 06/08/20 Coccyx Stage 1 -  Intact skin with non-blanchable redness of a localized area usually over a bony prominence. (Active)  06/08/20 1500  Location: Coccyx  Location Orientation:   Staging: Stage 1 -  Intact skin with non-blanchable redness of a localized area usually over a bony prominence.  Wound Description (Comments):   Present on Admission: Yes (present upon transfer)    Physical Exam: Vital Signs Blood pressure 123/66, pulse 75, temperature 98.6 F (37 C), temperature source Oral, resp. rate 18, height 5\' 11"  (1.803 m), weight 107.3 kg, SpO2 99 %.  General: Alert and oriented x 3, No apparent distress HEENT: Head is normocephalic, atraumatic, PERRLA, EOMI, sclera anicteric, oral mucosa pink and moist, dentition intact, ext ear canals clear,  Neck: Supple without JVD or lymphadenopathy Heart: Reg rate and rhythm. No murmurs rubs or gallops Chest: CTA bilaterally without wheezes, rales, or rhonchi; no distress Abdomen: Soft, non-tender, non-distended, bowel sounds positive. Extremities: No clubbing, cyanosis,   Pulses are 2+ Skin: small stage 1 sacrum. Ertyhema and sanguinous drainage along lateral aspects of incision. Incision well approximated still. Right heel wound with eschar. Vascular changes RLE Neuro: Pt is cognitively appropriate with normal insight, memory, and awareness. Cranial nerves 2-12 are intact. Sensory exam is normal. Reflexes are 2+ in all 4's. Fine motor coordination is intact. No tremors. Motor function is grossly 5/5.  Musculoskeletal: left BK with mild tenderness to palpation. Stump edematous. Full knee extension Psych: Pt's affect is appropriate. Pt is cooperative     Assessment/Plan: 1. Functional deficits which require 3+ hours per day of interdisciplinary therapy in a comprehensive inpatient rehab setting.  Physiatrist is providing close team supervision and 24 hour management of active medical problems listed below.  Physiatrist and rehab team continue to assess barriers to discharge/monitor patient progress toward functional and medical goals  Care Tool:  Bathing              Bathing assist       Upper Body Dressing/Undressing Upper body dressing        Upper body assist      Lower Body Dressing/Undressing Lower body dressing            Lower body assist       Toileting Toileting    Toileting assist Assist for toileting: Independent with assistive device Assistive Device Comment: Urinal   Transfers Chair/bed transfer  Transfers assist     Chair/bed transfer assist level: Moderate Assistance - Patient 50 -  74%     Locomotion Ambulation   Ambulation assist              Walk 10 feet activity   Assist           Walk 50 feet activity   Assist           Walk 150 feet activity   Assist           Walk 10 feet on uneven surface  activity   Assist           Wheelchair     Assist               Wheelchair 50 feet with 2 turns activity    Assist            Wheelchair 150 feet  activity     Assist          Blood pressure 123/66, pulse 75, temperature 98.6 F (37 C), temperature source Oral, resp. rate 18, height 5\' 11"  (1.803 m), weight 107.3 kg, SpO2 99 %. Medical Problem List and Plan: 1. Deficits with mobility, transfers, endurance, self-care secondary to left BKA secondary to osteomyelitis.             -patient may shower with stump covered             -ELOS/Goals: 10-15 days/supervision/mod I             -beginning therapies today 2.  Antithrombotics: -DVT/anticoagulation:  Pharmaceutical: Lovenox             -antiplatelet therapy: N/A 3. Pain Management: Oxycodone prn              Monitor postoperative pain with increased exertion             minimal phantom limb pain at present 4. Mood: Team support             -antipsychotic agents: N/A 5. Neuropsych: This patient is capable of making decisions on his own behalf. 6. Skin/Wound Care:  -right heel ulcer: foam dressing, PRAFO  -left BKA incision: telfa, kerlix, ACE, stump shrinker  -sacrum pressure relief 7. Fluids/Electrolytes/Nutrition: Monitor intake/output.  Heart healthy diet.             encourage appropriate PO  -I personally reviewed the patient's labs today.   8.  T2DM: Monitor BS ac/hs. Continue glyburide 1 mg bid with Levemir 10 units qhs and 15 in am. Novolog 6 TID            tight control at present 9. Acute on chronic anemia:              Hb 10-->8.5 on 11/9--> 8.1 today---continue to monitor 10. Thrombocytopenia             platelets 131k 11/11 11. Renal transplant/CKD IIIb:             Followed by Dr. [name].              BUN/Cr 37/1.95 12. Aortic stenosis/fluid overload: Continue Lasix. Monitor weights and for signs of overload.  13. Left elbow tendinitis/bursitis: Ice, compression, pressure relief.        LOS: 1 days A FACE TO [place] 06/11/2020, 10:15 AM

## 2020-06-11 NOTE — Progress Notes (Signed)
Inpatient Rehabilitation  Patient information reviewed and entered into eRehab system by Jorie Zee M. Vance Belcourt, M.A., CCC/SLP, PPS Coordinator.  Information including medical coding, functional ability and quality indicators will be reviewed and updated through discharge.    

## 2020-06-12 ENCOUNTER — Inpatient Hospital Stay (HOSPITAL_COMMUNITY): Payer: BC Managed Care – PPO | Admitting: Occupational Therapy

## 2020-06-12 ENCOUNTER — Inpatient Hospital Stay (HOSPITAL_COMMUNITY): Payer: BC Managed Care – PPO

## 2020-06-12 LAB — GLUCOSE, CAPILLARY
Glucose-Capillary: 91 mg/dL (ref 70–99)
Glucose-Capillary: 148 mg/dL — ABNORMAL HIGH (ref 70–99)
Glucose-Capillary: 184 mg/dL — ABNORMAL HIGH (ref 70–99)
Glucose-Capillary: 179 mg/dL — ABNORMAL HIGH (ref 70–99)

## 2020-06-12 MED ORDER — ENOXAPARIN SODIUM 40 MG/0.4ML ~~LOC~~ SOLN
40.0000 mg | SUBCUTANEOUS | Status: DC
  Administered 2020-06-12 – 2020-06-23 (×12): 40 mg via SUBCUTANEOUS
  Filled 2020-06-12 (×12): qty 0.4

## 2020-06-12 MED ORDER — INSULIN DETEMIR 100 UNIT/ML ~~LOC~~ SOLN
10.0000 [IU] | Freq: Every day | SUBCUTANEOUS | Status: DC
  Administered 2020-06-13 – 2020-06-24 (×12): 10 [IU] via SUBCUTANEOUS
  Filled 2020-06-12 (×12): qty 0.1

## 2020-06-12 MED ORDER — INSULIN DETEMIR 100 UNIT/ML ~~LOC~~ SOLN
5.0000 [IU] | Freq: Every day | SUBCUTANEOUS | Status: DC
  Administered 2020-06-12 – 2020-06-17 (×6): 5 [IU] via SUBCUTANEOUS
  Filled 2020-06-12 (×7): qty 0.05

## 2020-06-12 MED ORDER — INSULIN ASPART 100 UNIT/ML ~~LOC~~ SOLN
3.0000 [IU] | Freq: Three times a day (TID) | SUBCUTANEOUS | Status: DC
  Administered 2020-06-12 – 2020-06-16 (×12): 3 [IU] via SUBCUTANEOUS

## 2020-06-12 NOTE — Progress Notes (Signed)
[facility] PHYSICAL MEDICINE & REHABILITATION PROGRESS NOTE   Subjective/Complaints: Had a good night. Sugars have been quite low. Pain minimal LLE.   ROS: Patient denies fever, rash, sore throat, blurred vision, nausea, vomiting, diarrhea, cough, shortness of breath or chest pain, joint or back pain, headache, or mood change.   Objective:   No results found. Recent Labs    06/11/20 0023  WBC 4.0  HGB 8.1*  HCT 26.0*  PLT 131*   Recent Labs    06/11/20 0023  NA 137  K 4.0  CL 106  CO2 19*  GLUCOSE 78  BUN 37*  CREATININE 1.95*  CALCIUM 8.3*    Intake/Output Summary (Last 24 hours) at 06/12/2020 0959 Last data filed at 06/12/2020 0900 Gross per 24 hour  Intake 600 ml  Output 1600 ml  Net -1000 ml     Pressure Injury 06/08/20 Coccyx Stage 1 -  Intact skin with non-blanchable redness of a localized area usually over a bony prominence. (Active)  06/08/20 1500  Location: Coccyx  Location Orientation:   Staging: Stage 1 -  Intact skin with non-blanchable redness of a localized area usually over a bony prominence.  Wound Description (Comments):   Present on Admission: Yes (present upon transfer)    Physical Exam: Vital Signs Blood pressure 124/67, pulse 80, temperature 98.2 F (36.8 C), resp. rate 16, height 5\' 11"  (1.803 m), weight 107 kg, SpO2 99 %.  Constitutional: No distress . Vital signs reviewed. HEENT: EOMI, oral membranes moist Neck: supple Cardiovascular: RRR without murmur. No JVD    Respiratory/Chest: CTA Bilaterally without wheezes or rales. Normal effort    GI/Abdomen: BS +, non-tender, non-distended Ext: no clubbing, cyanosis, or edema Psych: pleasant and cooperative Skin: small stage 1 sacrum. Ertyhema and sanguinous drainage along lateral aspects of incision. Incision well approximated still. Right heel wound with eschar--unchanged. Vascular changes RLE Neuro: Pt is cognitively appropriate with normal insight, memory, and awareness. Cranial  nerves 2-12 are intact. Sensory exam is normal. Reflexes are 2+ in all 4's. Fine motor coordination is intact. No tremors. Motor function is grossly 5/5.  Musculoskeletal: left BK with mild tenderness to palpation. Stump remains edematous. Full knee extension       Assessment/Plan: 1. Functional deficits which require 3+ hours per day of interdisciplinary therapy in a comprehensive inpatient rehab setting.  Physiatrist is providing close team supervision and 24 hour management of active medical problems listed below.  Physiatrist and rehab team continue to assess barriers to discharge/monitor patient progress toward functional and medical goals  Care Tool:  Bathing    Body parts bathed by patient: Right arm, Left arm, Chest, Abdomen, Front perineal area, Buttocks, Right upper leg, Left upper leg, Right lower leg, Face         Bathing assist Assist Level: Minimal Assistance - Patient > 75% (for sit <>stand to bathe buttocks at sink)     Upper Body Dressing/Undressing Upper body dressing   What is the patient wearing?: Pull over shirt    Upper body assist Assist Level: Set up assist    Lower Body Dressing/Undressing Lower body dressing      What is the patient wearing?: Pants     Lower body assist Assist for lower body dressing: Moderate Assistance - Patient 50 - 74%     Toileting Toileting    Toileting assist Assist for toileting: Independent Assistive Device Comment: urinal   Transfers Chair/bed transfer  Transfers assist     Chair/bed transfer assist  level: Moderate Assistance - Patient 50 - 74%     Locomotion Ambulation   Ambulation assist      Assist level: Minimal Assistance - Patient > 75% Assistive device: Walker-rolling Max distance: 7'   Walk 10 feet activity   Assist  Walk 10 feet activity did not occur: Safety/medical concerns        Walk 50 feet activity   Assist Walk 50 feet with 2 turns activity did not occur:  Safety/medical concerns         Walk 150 feet activity   Assist Walk 150 feet activity did not occur: Safety/medical concerns         Walk 10 feet on uneven surface  activity   Assist Walk 10 feet on uneven surfaces activity did not occur: Safety/medical concerns         Wheelchair     Assist Will patient use wheelchair at discharge?: Yes Type of Wheelchair: Manual Wheelchair activity did not occur: Safety/medical concerns         Wheelchair 50 feet with 2 turns activity    Assist    Wheelchair 50 feet with 2 turns activity did not occur: Safety/medical concerns       Wheelchair 150 feet activity     Assist  Wheelchair 150 feet activity did not occur: Safety/medical concerns       Blood pressure 124/67, pulse 80, temperature 98.2 F (36.8 C), resp. rate 16, height 5\' 11"  (1.803 m), weight 107 kg, SpO2 99 %. Medical Problem List and Plan: 1. Deficits with mobility, transfers, endurance, self-care secondary to left BKA secondary to osteomyelitis.             -patient may shower with stump covered             -ELOS/Goals: 10-15 days/supervision/mod I             --Continue CIR therapies including PT, OT  2.  Antithrombotics: -DVT/anticoagulation:  Pharmaceutical: Lovenox             -antiplatelet therapy: N/A 3. Pain Management: Oxycodone prn              Monitor postoperative pain with increased exertion             really no phantom limb pain at present 4. Mood: Team support             -antipsychotic agents: N/A 5. Neuropsych: This patient is capable of making decisions on his own behalf. 6. Skin/Wound Care:  -right heel ulcer: foam dressing, PRAFO, ortho has seen also  -left BKA incision: telfa, kerlix, ACE, stump shrinker  -sacrum pressure relief 7. Fluids/Electrolytes/Nutrition: Monitor intake/output.  Heart healthy diet.             encourage appropriate PO  F/u labs Monday.   8.  T2DM: Monitor BS ac/hs. Continue glyburide 1 mg  bid with Levemir 10 units qhs and 15 in am. Novolog 6 TID            10/12 reduce levemir to 10u qam and 5u qpm   -reduce novolog to 3u tid   -continue glyburide 5mg  bid 9. Acute on chronic anemia:              Hb 10-->8.5 on 11/9--> 8.1 today---labs ordered for Monday 11/15 10. Thrombocytopenia             platelets 131k 11/11 11. Renal transplant/CKD IIIb:  Followed by Dr. [name].              BUN/Cr 37/1.95 12. Aortic stenosis/fluid overload: Continue Lasix. Monitor weights and for signs of overload.   Danley Danker Weights   06/10/20 1659 06/12/20 0345  Weight: 107.3 kg 107 kg    13. Left elbow tendinitis/bursitis: Ice, compression, pressure relief.        LOS: 2 days A FACE TO FACE EVALUATION WAS PERFORMED  Meredith Staggers 06/12/2020, 9:59 AM

## 2020-06-12 NOTE — Care Management (Signed)
[facility] Individual Statement of Services  Patient Name:  Steven [name]  Date:  06/12/2020  Welcome to the [facility].  Our goal is to provide you with an individualized program based on your diagnosis and situation, designed to meet your specific needs.  With this comprehensive rehabilitation program, you will be expected to participate in at least 3 hours of rehabilitation therapies Monday-Friday, with modified therapy programming on the weekends.  Your rehabilitation program will include the following services:  Physical Therapy (PT), Occupational Therapy (OT), 24 hour per day rehabilitation nursing, Therapeutic Recreaction (TR), Psychology, Neuropsychology, Care Coordinator, Rehabilitation Medicine, Nutrition Services, Pharmacy Services and Other  Weekly team conferences will be held on Tuesdays to discuss your progress.  Your Inpatient Rehabilitation Care Coordinator will talk with you frequently to get your input and to update you on team discussions.  Team conferences with you and your family in attendance may also be held.  Expected length of stay: 11-14 days   Overall anticipated outcome: Supervision  Depending on your progress and recovery, your program may change. Your Inpatient Rehabilitation Care Coordinator will coordinate services and will keep you informed of any changes. Your Inpatient Rehabilitation Care Coordinator's name and contact numbers are listed  below.  The following services may also be recommended but are not provided by the [facility] will be made to provide these services after discharge if needed.  Arrangements include referral to agencies that provide these services.  Your insurance has been verified to be:  [employer]  Your primary doctor is:  Neurosurgeon y  Isaac Bliss  Pertinent information will be shared with your doctor and your insurance company.  Inpatient Rehabilitation Care Coordinator:  Cathleen Corti [phone] or ([phone]  Information discussed with and copy given to patient by: Rana Snare, 06/12/2020, 9:40 AM

## 2020-06-12 NOTE — Progress Notes (Signed)
Patient is status post transtibial amputation.  On the left asked to reevaluate right knee heel ulcer.  He does have this ulcer and it has been debrided before by Dr. Sharol Given.  Patient states he has not noted any changes.  He has a foam dressing and a PRAFO boot on in bed  There is a large eschar there is no fluctuance about it no cellulitis could not appreciate any drainage this morning.  Discussed with the patient to continue to wear the [facility] boot especially while in bed.  Can take it off for short times in physical therapy will need 1 week follow-up in our office after discharge

## 2020-06-12 NOTE — Progress Notes (Signed)
Occupational Therapy Session Note  Patient Details  Name: Steven [name] MRN: [id number] Date of Birth: 1956-05-31  Today's Date: 06/12/2020 OT Individual Time: 0175-1025 OT Individual Time Calculation (min): 64 min    Short Term Goals: Week 1:  OT Short Term Goal 1 (Week 1): Pt will complete squat pivot transfer w/c<>drop arm commode with CGA OT Short Term Goal 2 (Week 1): Pt will complete LB dressing with CGA OT Short Term Goal 3 (Week 1): Pt will complete UB/LB bathing with CGA OT Short Term Goal 4 (Week 1): Pt will achieve 0 degrees left elbow passive and active extension.  Skilled Therapeutic Interventions/Progress Updates:    Pt sitting up in recliner, no c/o pain, agreeable to OT session.  Pt completed stand pivot recliner to w/c with min assist using RW.  Pt completed stand pivot w/c to TTB using RW with min assist.  Pt doffed shirt with mod I and shorts using lateral leaning in sitting with CGA. Pt doffed right sock with CGA.  Waterproof barrier applied to LLE and right upper extremity IV.  Pt bathed UB with supervision and LB with CGA allowing antibacterial soap and running water over right heel and patted dry then covered with dressing per MD orders.  Pt donned shirt with setup, and shorts with mod assist in standing at RW.  Pt completed stand pivot w/c to recliner with min assist.  Call bell in reach, seat pad alarm on.   Therapy Documentation Precautions:  Precautions Precautions: Fall Precaution Comments: L BKA wound back; watch SpO2/HR Required Braces or Orthoses: Other Brace Other Brace: limb protector Restrictions Weight Bearing Restrictions: Yes LLE Weight Bearing: Non weight bearing   Therapy/Group: Individual Therapy  Ezekiel Slocumb 06/12/2020, 12:32 PM

## 2020-06-12 NOTE — Progress Notes (Signed)
Physical Therapy Session Note  Patient Details  Name: Steven [name] MRN: [id number] Date of Birth: Jan 20, 1956  Today's Date: 06/12/2020 PT Individual Time: 4481-8563 and 1497-0263 PT Individual Time Calculation (min): 55 min and 73 min  Short Term Goals: Week 1:  PT Short Term Goal 1 (Week 1): Pt will perform bed mobility with minA PT Short Term Goal 2 (Week 1): Pt will perform bed to chair transfer with minA. PT Short Term Goal 3 (Week 1): Pt will ambulate x25' with minA.  Skilled Therapeutic Interventions/Progress Updates:     PT received supine in bed and agrees to therapy. Supine to sit with HOB elevated and use of bedrails, with PT cueing on logrolling and sequencing. PT notes pitting edema in R calf and MD notified, ordering TED hose for pt. Pt performs squat pivot to WC with modA. PT spends extra time educating and practicing head/hips relationship before and following transfer. WC transport to gym for time management. modA for squat pivot to Nustep. Pt performs Nustep activity with L residual limb propped on seat. Pt completes total of 12 minutes at workload of 4 with step per minute > 40 throughout, but pt requires rest breaks every 1-2 minutes. Activity performed for endurance training, strengthening, and reciprocal coordination. Borg RPE 15/20 following activity. Squat pivot back to WC with modA. Pt self propels WC x150' with supervision using bilateral upper extremities. Pt left seated in WC with alarm intact and all needs within reach.  2nd Session: Pt received seated in [facility] and agrees to therapy. No complaint of pain. WC transport to gym for time management. Squat pivot to mat table with minA/modA and cues for head/hips relationship. Sit to supine with supervision. Pt performs 1x15 glute sets, quad sets, hip abduction, and SLRs. Pt verbalizes need to move bowels. Supine to sit with supervision. Squat pivot to WC with minA/modA.   Pt performs stand pivot to toilet with grab bars  and modA. Following toileting pt performs pericare independently. moda For stand pivot back to [facility] with cues on body mechanics and hand placement.  Pt performs NMR in parallel bars for standing balance, R leg stance control and strengthening. Multiple reps of sit to stand performed with MinA progressing to CGA. Pt performs 1x10 heel raises. Pt then performs heel cord stretching with tilt board for soft tissue lengthening. Pt ambulates x5' in parallel bars with CGA.   Squat pivot from WC to recliner with modA. Pt left with all needs within reach.  Therapy Documentation Precautions:  Precautions Precautions: Fall Precaution Comments: L BKA wound back; watch SpO2/HR Required Braces or Orthoses: Other Brace Other Brace: limb protector Restrictions Weight Bearing Restrictions: Yes LLE Weight Bearing: Non weight bearing    Therapy/Group: Individual Therapy  [name] 06/12/2020, 10:37 AM

## 2020-06-12 NOTE — IPOC Note (Signed)
Overall Plan of Care [facility]) Patient Details Name: Steven [name] MRN: [id number] DOB: 08/03/1955  Admitting Diagnosis: S/P BKA (below knee amputation) unilateral, left [facility])  Hospital Problems: Principal Problem:   S/P BKA (below knee amputation) unilateral, left ([place]) Active Problems:   Below-knee amputation of left lower extremity ([place])     Functional Problem List: Nursing Endurance, Medication Management, Skin Integrity, Safety, Perception, Motor  PT Balance, Endurance, Motor, Pain, Safety, Skin Integrity  OT Balance, Safety, Edema, Motor, Pain, Skin Integrity  SLP    TR         Basic ADL's: OT Toileting, Dressing, Bathing     Advanced  ADL's: OT       Transfers: PT Bed Mobility, Bed to Chair, Teacher, early years/pre, Tub/Shower     Locomotion: PT Ambulation, Emergency planning/management officer, Stairs     Additional Impairments: OT None  SLP        TR      Anticipated Outcomes Item Anticipated Outcome  Self Feeding    Swallowing      Basic self-care  setup-supervision  Toileting  supervision   Bathroom Transfers supervision  Bowel/Bladder  remain continent of bowel/bladder with mod i  Transfers  Supervision  Locomotion  Supervision household distances  Communication     Cognition     Pain  no pain  Safety/Judgment  able to call for help and express need   Therapy Plan: PT Intensity: Minimum of 1-2 x/day ,45 to 90 minutes PT Frequency: 5 out of 7 days PT Duration Estimated Length of Stay: 11-14 days OT Intensity: Minimum of 1-2 x/day, 45 to 90 minutes OT Frequency: 5 out of 7 days OT Duration/Estimated Length of Stay: 11-14 days     Due to the current state of emergency, patients may not be receiving their 3-hours of Medicare-mandated therapy.   Team Interventions: Nursing Interventions Patient/Family Education, Medication Management, Skin Care/Wound Management, Disease Management/Prevention, Discharge Planning  PT interventions Ambulation/gait  training, Cognitive remediation/compensation, Discharge planning, DME/adaptive equipment instruction, Functional mobility training, Pain management, Psychosocial support, Splinting/orthotics, Therapeutic Activities, UE/LE Strength taining/ROM, Visual/perceptual remediation/compensation, Training and development officer, Community reintegration, Disease management/prevention, Neuromuscular re-education, Functional electrical stimulation, Patient/family education, Skin care/wound management, Stair training, Therapeutic Exercise, UE/LE Coordination activities, Wheelchair propulsion/positioning  OT Interventions Training and development officer, Discharge planning, Pain management, Self Care/advanced ADL retraining, Therapeutic Activities, UE/LE Coordination activities, Disease mangement/prevention, Functional mobility training, Patient/family education, Skin care/wound managment, Therapeutic Exercise, DME/adaptive equipment instruction, Psychosocial support, Neuromuscular re-education, UE/LE Strength taining/ROM, Wheelchair propulsion/positioning  SLP Interventions    TR Interventions    SW/CM Interventions Discharge Planning, Psychosocial Support, Patient/Family Education   Barriers to Discharge MD  Medical stability  Nursing      PT Wound Care, Weight bearing restrictions    OT      SLP      SW       Team Discharge Planning: Destination: PT-Home ,OT- Home , SLP-  Projected Follow-up: PT-Home health PT, OT-  Home health OT, SLP-  Projected Equipment Needs: PT-To be determined, OT- To be determined, SLP-  Equipment Details: PT- , OT-  Patient/family involved in discharge planning: PT- Patient,  OT-Patient, SLP-   MD ELOS: 11-14 days Medical Rehab Prognosis:  Excellent Assessment: The patient has been admitted for CIR therapies with the diagnosis of left BKA d/t PAD/osteomyelitis. The team will be addressing functional mobility, strength, stamina, balance, safety, adaptive techniques and equipment,  self-care, bowel and bladder mgt, patient and caregiver education, pain mgt, pre-prosthetic education, wound care, community  reentry. Goals have been set at supervision to set up with self-care and locomotion.   Due to the current state of emergency, patients may not be receiving their 3 hours per day of Medicare-mandated therapy.    Meredith Staggers, MD, FAAPMR      See Team Conference Notes for weekly updates to the plan of care

## 2020-06-12 NOTE — Progress Notes (Signed)
Patient ID: Steven [name], male   DOB: [birth year], 64 y.o.   MRN: [id number]   SW spoke with pt wife (seperated) [name] [phone]) to introduce self, explain role, and discuss discharge process. SW informed there will be follow-up after team conference.   Loralee Pacas, MSW, [place] Office: [phone] Cell: [phone] Fax: [phone]

## 2020-06-13 LAB — GLUCOSE, CAPILLARY
Glucose-Capillary: 229 mg/dL — ABNORMAL HIGH (ref 70–99)
Glucose-Capillary: 230 mg/dL — ABNORMAL HIGH (ref 70–99)
Glucose-Capillary: 178 mg/dL — ABNORMAL HIGH (ref 70–99)
Glucose-Capillary: 126 mg/dL — ABNORMAL HIGH (ref 70–99)

## 2020-06-13 MED ORDER — ASPIRIN 81 MG PO CHEW
81.0000 mg | CHEWABLE_TABLET | Freq: Every day | ORAL | Status: DC
  Filled 2020-06-13 (×4): qty 1

## 2020-06-13 NOTE — Progress Notes (Signed)
Patient refuses his ASA, he said because of his kidney transplant. Patient educated on the important of taking his med. MD notified.

## 2020-06-13 NOTE — Progress Notes (Signed)
[facility] PHYSICAL MEDICINE & REHABILITATION PROGRESS NOTE   Subjective/Complaints:   Pain controlled with meds- today is "his day of rest".  Bed miserable.   Has [employer] shoe for R foot- helpful for plantar fascitis- wants to use instead of healing shoe.   D/C IV R forearm- not using.   ROS:  Pt denies SOB, abd pain, CP, N/V/C/D, and vision changes   Objective:   No results found. Recent Labs    06/11/20 0023  WBC 4.0  HGB 8.1*  HCT 26.0*  PLT 131*   Recent Labs    06/11/20 0023  NA 137  K 4.0  CL 106  CO2 19*  GLUCOSE 78  BUN 37*  CREATININE 1.95*  CALCIUM 8.3*    Intake/Output Summary (Last 24 hours) at 06/13/2020 1007 Last data filed at 06/13/2020 0750 Gross per 24 hour  Intake 800 ml  Output 1350 ml  Net -550 ml     Pressure Injury 06/08/20 Coccyx Stage 1 -  Intact skin with non-blanchable redness of a localized area usually over a bony prominence. (Active)  06/08/20 1500  Location: Coccyx  Location Orientation:   Staging: Stage 1 -  Intact skin with non-blanchable redness of a localized area usually over a bony prominence.  Wound Description (Comments):   Present on Admission: Yes (present upon transfer)    Physical Exam: Vital Signs Blood pressure 119/64, pulse 76, temperature 98.6 F (37 C), temperature source Oral, resp. rate 18, height 5\' 11"  (1.803 m), weight 108.5 kg, SpO2 98 %.  Constitutional: No distress . Vital signs reviewed. Sitting up in bed- finished breakfast, NAD HEENT: EOMI, oral membranes moist Neck: supple Cardiovascular: RRR   Respiratory/Chest: CTA B/L- no W/R/R- good air movement    GI/Abdomen: Soft, NT, ND, (+)BS  Ext: no clubbing, cyanosis, or edema Psych: pleasant and cooperative Skin: small stage 1 sacrum. Ertyhema and sanguinous drainage along lateral aspects of incision. Incision well approximated still. Right heel wound with eschar--unchanged. Vascular changes RLE R forearm IV- looks OK- not being used Neuro:  Pt is cognitively appropriate with normal insight, memory, and awareness. Cranial nerves 2-12 are intact. Sensory exam is normal. Reflexes are 2+ in all 4's. Fine motor coordination is intact. No tremors. Motor function is grossly 5/5.  Musculoskeletal: left BK with mild tenderness to palpation. Stump remains edematous. Full knee extension       Assessment/Plan: 1. Functional deficits which require 3+ hours per day of interdisciplinary therapy in a comprehensive inpatient rehab setting.  Physiatrist is providing close team supervision and 24 hour management of active medical problems listed below.  Physiatrist and rehab team continue to assess barriers to discharge/monitor patient progress toward functional and medical goals  Care Tool:  Bathing    Body parts bathed by patient: Right arm, Left arm, Chest, Abdomen, Front perineal area, Buttocks, Right upper leg, Left upper leg, Right lower leg, Face         Bathing assist Assist Level: Minimal Assistance - Patient > 75% (for sit <>stand to bathe buttocks at sink)     Upper Body Dressing/Undressing Upper body dressing   What is the patient wearing?: Pull over shirt    Upper body assist Assist Level: Set up assist    Lower Body Dressing/Undressing Lower body dressing      What is the patient wearing?: Pants     Lower body assist Assist for lower body dressing: Moderate Assistance - Patient 50 - 74%     Toileting Toileting  Toileting assist Assist for toileting: Independent Assistive Device Comment: urinal   Transfers Chair/bed transfer  Transfers assist     Chair/bed transfer assist level: Moderate Assistance - Patient 50 - 74%     Locomotion Ambulation   Ambulation assist      Assist level: Contact Guard/Touching assist Assistive device: Parallel bars Max distance: 5'   Walk 10 feet activity   Assist  Walk 10 feet activity did not occur: Safety/medical concerns        Walk 50 feet  activity   Assist Walk 50 feet with 2 turns activity did not occur: Safety/medical concerns         Walk 150 feet activity   Assist Walk 150 feet activity did not occur: Safety/medical concerns         Walk 10 feet on uneven surface  activity   Assist Walk 10 feet on uneven surfaces activity did not occur: Safety/medical concerns         Wheelchair     Assist Will patient use wheelchair at discharge?: Yes Type of Wheelchair: Manual Wheelchair activity did not occur: Safety/medical concerns  Wheelchair assist level: Supervision/Verbal cueing Max wheelchair distance: 150'    Wheelchair 50 feet with 2 turns activity    Assist    Wheelchair 50 feet with 2 turns activity did not occur: Safety/medical concerns   Assist Level: Supervision/Verbal cueing   Wheelchair 150 feet activity     Assist  Wheelchair 150 feet activity did not occur: Safety/medical concerns   Assist Level: Supervision/Verbal cueing   Blood pressure 119/64, pulse 76, temperature 98.6 F (37 C), temperature source Oral, resp. rate 18, height 5\' 11"  (1.803 m), weight 108.5 kg, SpO2 98 %. Medical Problem List and Plan: 1. Deficits with mobility, transfers, endurance, self-care secondary to left BKA secondary to osteomyelitis.             -patient may shower with stump covered             -ELOS/Goals: 10-15 days/supervision/mod I             --Continue CIR therapies including PT, OT  2.  Antithrombotics: -DVT/anticoagulation:  Pharmaceutical: Lovenox             -antiplatelet therapy: N/A 3. Pain Management: Oxycodone prn              Monitor postoperative pain with increased exertion             really no phantom limb pain at present  11/13- pain controlled- con't regimen at present- is working 4. Mood: Team support             -antipsychotic agents: N/A 5. Neuropsych: This patient is capable of making decisions on his own behalf. 6. Skin/Wound Care:  -right heel ulcer: foam  dressing, PRAFO, ortho has seen also  -left BKA incision: telfa, kerlix, ACE, stump shrinker  -sacrum pressure relief  11/13- will d/c R forearm IV- not using.  7. Fluids/Electrolytes/Nutrition: Monitor intake/output.  Heart healthy diet.             encourage appropriate PO  F/u labs Monday.   8.  T2DM: Monitor BS ac/hs. Continue glyburide 1 mg bid with Levemir 10 units qhs and 15 in am. Novolog 6 TID            10/12 reduce levemir to 10u qam and 5u qpm   -reduce novolog to 3u tid   -continue glyburide 5mg  bid  11/13- BGs 91-229-  1/2 <150- 1/2 >150- reduced insulin due to low BGs- will wait to make changes for now 9. Acute on chronic anemia:              Hb 10-->8.5 on 11/9--> 8.1 today---labs ordered for Monday 11/15 10. Thrombocytopenia             platelets 131k 11/11 11. Renal transplant/CKD IIIb:             Followed by Dr. [name].              BUN/Cr 37/1.95 12. Aortic stenosis/fluid overload: Continue Lasix. Monitor weights and for signs of overload.   Danley Danker Weights   06/10/20 1659 06/12/20 0345 06/13/20 0420  Weight: 107.3 kg 107 kg 108.5 kg    13. Left elbow tendinitis/bursitis: Ice, compression, pressure relief.        LOS: 3 days A FACE TO FACE EVALUATION WAS PERFORMED  Karin Griffith 06/13/2020, 10:07 AM

## 2020-06-14 ENCOUNTER — Inpatient Hospital Stay (HOSPITAL_COMMUNITY): Payer: BC Managed Care – PPO | Admitting: Occupational Therapy

## 2020-06-14 ENCOUNTER — Inpatient Hospital Stay (HOSPITAL_COMMUNITY): Payer: BC Managed Care – PPO

## 2020-06-14 LAB — GLUCOSE, CAPILLARY
Glucose-Capillary: 137 mg/dL — ABNORMAL HIGH (ref 70–99)
Glucose-Capillary: 177 mg/dL — ABNORMAL HIGH (ref 70–99)
Glucose-Capillary: 136 mg/dL — ABNORMAL HIGH (ref 70–99)
Glucose-Capillary: 149 mg/dL — ABNORMAL HIGH (ref 70–99)

## 2020-06-14 MED ORDER — SORBITOL 70 % SOLN
30.0000 mL | Freq: Once | Status: AC
  Administered 2020-06-14: 30 mL via ORAL
  Filled 2020-06-14: qty 30

## 2020-06-14 NOTE — Progress Notes (Signed)
Occupational Therapy Session Note  Patient Details  Name: Steven [name] MRN: [id number] Date of Birth: 09-29-1955  Today's Date: 06/14/2020 OT Individual Time: 8110-3159 OT Individual Time Calculation (min): 57 min    Short Term Goals: Week 1:  OT Short Term Goal 1 (Week 1): Pt will complete squat pivot transfer w/c<>drop arm commode with CGA OT Short Term Goal 2 (Week 1): Pt will complete LB dressing with CGA OT Short Term Goal 3 (Week 1): Pt will complete UB/LB bathing with CGA OT Short Term Goal 4 (Week 1): Pt will achieve 0 degrees left elbow passive and active extension.  Skilled Therapeutic Interventions/Progress Updates:    Pt greeted in the w/c with no c/o pain, requesting to start session by completing oral care at the sink and changing shirts. He complete both of these ADL tasks with setup assistance. For remainder of session, focused on amputee education. Started by removing legrest + limb rest from w/c, retrieving these w/c components from the floor before reapplying them, vcs for ensuring that w/c was locked during dynamic sitting for decreasing fall risk. Transitioned to daily residual limb inspection, issued him with a [place] mirror and we discussed importance of using mirror at home and also here at [facility] during dressing changes with RN. Practiced doffing/donning shrinker, which pt reported he has not been able to do yet. With min cuing for initial attempt, pt was able to doff/don shrinker on the 2nd time independently. Educated pt on importance of routine pressure relief for skin integrity, he practiced anterior and lateral weight shifts x1 minute holds with guided instruction. At close of session pt remained sitting in the w/c with all needs within reach and safety belt fastened.   Pt declined showering during tx  Therapy Documentation Precautions:  Precautions Precautions: Fall Precaution Comments: L BKA wound back; watch SpO2/HR Required Braces or Orthoses: Other  Brace Other Brace: limb protector Restrictions Weight Bearing Restrictions: Yes LLE Weight Bearing: Non weight bearing Vital Signs: Therapy Vitals Temp: 98.2 F (36.8 C) Pulse Rate: 87 Resp: 20 BP: (!) 119/59 Patient Position (if appropriate): Sitting Oxygen Therapy SpO2: 100 % O2 Device: Room Air ADL: ADL Grooming: Setup Where Assessed-Grooming: Sitting at sink Upper Body Bathing: Setup Where Assessed-Upper Body Bathing: Sitting at sink Lower Body Bathing: Minimal assistance Where Assessed-Lower Body Bathing: Sitting at sink, Standing at sink Upper Body Dressing: Setup Where Assessed-Upper Body Dressing: Sitting at sink Where Assessed-Lower Body Dressing: Sitting at sink, Standing at sink Toilet Transfer Method: Squat pivot Toilet Transfer Equipment: Drop arm bedside commode     Therapy/Group: Individual Therapy  [name] A Jennea Rager 06/14/2020, 3:43 PM

## 2020-06-14 NOTE — Progress Notes (Signed)
Physical Therapy Session Note  Patient Details  Name: Steven [name] MRN: [id number] Date of Birth: Jun 03, 1956  Today's Date: 06/14/2020 PT Individual Time: 6834-1962 and 2297-9892 PT Individual Time Calculation (min): 77 min and 40 min   Short Term Goals: Week 1:  PT Short Term Goal 1 (Week 1): Pt will perform bed mobility with minA PT Short Term Goal 2 (Week 1): Pt will perform bed to chair transfer with minA. PT Short Term Goal 3 (Week 1): Pt will ambulate x25' with minA.  Skilled Therapeutic Interventions/Progress Updates:     Session 1: Patient in bed upon PT arrival. Patient alert and agreeable to PT session. Patient denied pain during session. Noted dressing, ACE wrap and shrinker applied to L residual limb. Removed all with RN present for incision inspection. Noted mild serosanguinous drainage on dressing, patient with some dark erythema around the incision, per patient no different than the day before. Increased edema without pitting throughout limb. No signs of increased temperature or odor on observation. Per discussion with RN, placed 2 nonadherent dressings over incision with Kerlix then shrinker over dressing. Educated patient on signs and symptoms of infection, performing daily limb inspection of B limbs, and shrinker donning/doffing technique throughout.    Therapeutic Activity: Bed Mobility: Patient performed supine to sit with min A for trunk support in a flat bed without use of bed rails to simulate home environment. Provided verbal cues for rolling technique, bringing L residual limb across and towards his chest to assist with roll, then pushing up using his R elbow to come to sitting. Patient sat EOB with supervision for sitting balance as PT donned R tennis shoe with total A, per MD note okay instead of offloading shoe.  Transfers: Patient performed squat pivot transfers bed<>w/c and w/c<>mat table with mod A x1 then CGA x3 following demonstration for technique.  Provided verbal cues and demonstration for hand placement, head-hips relationship, and w/c set-up throughout. He performed sit to/from stand x2 with CGA using RW. Attempted 1 trial with L hand on RW and R hand on w/c, patient unable to produce enough force to push up with this technique. Improved using B upper extremities to push up from w/c. Provided verbal cues for forward weight shift, bring one hand to the RW at a time for safety, and full trunk and hip extension in standing.  Wheelchair Mobility:  Patient propelled wheelchair ~130 feet x2 with supervision. Provided verbal cues for increased stroke length for improved momentum and turning technique. Traded 20"x18" w/c for 22"x18" w/c for reduced pressure at greater trochanters and increased sitting tolerance. Educated on use of breaks and donning/doffing leg rest and limb pad throughout session.   Patient in w/c at end of session with breaks locked, chair alarm set, and all needs within reach.   Session 2: Patient in w/c in the room upon PT arrival. Patient alert and agreeable to PT session. Patient denied pain during session.   Therapeutic Activity: Transfers: Patient performed sit to/from stand x2 with CGA from the w/c. Provided verbal cues for forward and R weight shift, hand placement on RW, and reaching back to sit for controlled descent and safety.  Gait Training:  Patient ambulated 5 feet x2 using RW with CGA and w/c follow due to decreased activity tolerance. Ambulated with hop-to gait pattern on R with decreased step length and height and increased impact at initial contact. Provided verbal cues and demonstration for use of back muscles and triceps to lift his foot off  the floor rather than hop for reduced impact on R foot, improved with verbal cues and repetition.  Wheelchair Mobility:  Patient propelled wheelchair 122 feet with supervision. Provided verbal cues for safe management around other people and objects and turning technique  x1. Provided w/c gloves before second trial with improved tolerance and stroke length after. Patient recalled how to don/doff his leg rest and limb pad with increased time without cues from previous session, and utilized breaks appropriately without cues.   Discussed patient's progress and need for increased upper body conditioning to improve gait technique and activity tolerance and prevent over use injury to upper extremities and improve heeling for the wound on his R heel. Patient reports he felt he got good results from the NuStep for a full body workout earlier this week. Will pass on to lead PT.   Also, provided patient with hand-held mirror to perform daily limb inspections. Demonstrated how to use the mirror and patient practiced with shrinker on at end of session.   Patient in w/c with his father in the room at end of session with breaks locked, chair alarm set, and all needs within reach.   Therapy Documentation Precautions:  Precautions Precautions: Fall Precaution Comments: L BKA wound back; watch SpO2/HR Required Braces or Orthoses: Other Brace Other Brace: limb protector Restrictions Weight Bearing Restrictions: Yes LLE Weight Bearing: Non weight bearing   Therapy/Group: Individual Therapy  Leray Garverick L Clydean Posas PT, DPT  06/14/2020, 12:51 PM

## 2020-06-14 NOTE — Progress Notes (Signed)
[facility] PHYSICAL MEDICINE & REHABILITATION PROGRESS NOTE   Subjective/Complaints:    Pt reports that due to kidney transplants, was told not to take ASA 81 mg- Cards told us to restart it- per pharmacy- pt refusing it- will let regular physician address during week.    Cold in room- therapy today-  Constipated- got miralax O/N- LBM 3 days ago- not even taking opiates currently per pt.   Also had some phantom pain last night-  Not bad.     ROS:   Pt denies SOB, abd pain, CP, N/V/C/D, and vision changes  Objective:   No results found. No results for input(s): WBC, HGB, HCT, PLT in the last 72 hours. No results for input(s): NA, K, CL, CO2, GLUCOSE, BUN, CREATININE, CALCIUM in the last 72 hours.  Intake/Output Summary (Last 24 hours) at 06/14/2020 1229 Last data filed at 06/14/2020 0700 Gross per 24 hour  Intake 720 ml  Output 1350 ml  Net -630 ml     Pressure Injury 06/08/20 Coccyx Stage 1 -  Intact skin with non-blanchable redness of a localized area usually over a bony prominence. (Active)  06/08/20 1500  Location: Coccyx  Location Orientation:   Staging: Stage 1 -  Intact skin with non-blanchable redness of a localized area usually over a bony prominence.  Wound Description (Comments):   Present on Admission: Yes (present upon transfer)    Physical Exam: Vital Signs Blood pressure (!) 141/71, pulse 78, temperature 98.1 F (36.7 C), resp. rate 18, height 5\' 11"  (1.803 m), weight 109.3 kg, SpO2 98 %.  Constitutional: No distress . Sitting up in bed watching TV, NAD HEENT: EOMI, oral membranes moist Neck: supple Cardiovascular: RRR  Respiratory/Chest: CTA B/L- no W/R/R- good air movement    GI/Abdomen: soft, slightly distended; hypoactive; NT Ext: no clubbing, cyanosis, or edema Psych: pleasant and cooperative Skin: small stage 1 sacrum. Ertyhema and sanguinous drainage along lateral aspects of incision. Incision well approximated still. Right heel wound  with eschar--unchanged. Vascular changes RLE Neuro: Pt is cognitively appropriate with normal insight, memory, and awareness. Cranial nerves 2-12 are intact. Sensory exam is normal. Reflexes are 2+ in all 4's. Fine motor coordination is intact. No tremors. Motor function is grossly 5/5.  Musculoskeletal: left BK with mild tenderness to palpation. Stump remains edematous. Full knee extension       Assessment/Plan: 1. Functional deficits which require 3+ hours per day of interdisciplinary therapy in a comprehensive inpatient rehab setting.  Physiatrist is providing close team supervision and 24 hour management of active medical problems listed below.  Physiatrist and rehab team continue to assess barriers to discharge/monitor patient progress toward functional and medical goals  Care Tool:  Bathing    Body parts bathed by patient: Right arm, Left arm, Chest, Abdomen, Front perineal area, Buttocks, Right upper leg, Left upper leg, Right lower leg, Face         Bathing assist Assist Level: Minimal Assistance - Patient > 75% (for sit <>stand to bathe buttocks at sink)     Upper Body Dressing/Undressing Upper body dressing   What is the patient wearing?: Pull over shirt    Upper body assist Assist Level: Set up assist    Lower Body Dressing/Undressing Lower body dressing      What is the patient wearing?: Pants     Lower body assist Assist for lower body dressing: Moderate Assistance - Patient 50 - 74%     Toileting Toileting    Toileting assist Assist  for toileting: Supervision/Verbal cueing Assistive Device Comment: urinal   Transfers Chair/bed transfer  Transfers assist     Chair/bed transfer assist level: Moderate Assistance - Patient 50 - 74%     Locomotion Ambulation   Ambulation assist      Assist level: Contact Guard/Touching assist Assistive device: Parallel bars Max distance: 5'   Walk 10 feet activity   Assist  Walk 10 feet activity did  not occur: Safety/medical concerns        Walk 50 feet activity   Assist Walk 50 feet with 2 turns activity did not occur: Safety/medical concerns         Walk 150 feet activity   Assist Walk 150 feet activity did not occur: Safety/medical concerns         Walk 10 feet on uneven surface  activity   Assist Walk 10 feet on uneven surfaces activity did not occur: Safety/medical concerns         Wheelchair     Assist Will patient use wheelchair at discharge?: Yes Type of Wheelchair: Manual Wheelchair activity did not occur: Safety/medical concerns  Wheelchair assist level: Supervision/Verbal cueing Max wheelchair distance: 150'    Wheelchair 50 feet with 2 turns activity    Assist    Wheelchair 50 feet with 2 turns activity did not occur: Safety/medical concerns   Assist Level: Supervision/Verbal cueing   Wheelchair 150 feet activity     Assist  Wheelchair 150 feet activity did not occur: Safety/medical concerns   Assist Level: Supervision/Verbal cueing   Blood pressure (!) 141/71, pulse 78, temperature 98.1 F (36.7 C), resp. rate 18, height 5\' 11"  (1.803 m), weight 109.3 kg, SpO2 98 %. Medical Problem List and Plan: 1. Deficits with mobility, transfers, endurance, self-care secondary to left BKA secondary to osteomyelitis.             -patient may shower with stump covered             -ELOS/Goals: 10-15 days/supervision/mod I             --Continue CIR therapies including PT, OT  2.  Antithrombotics: -DVT/anticoagulation:  Pharmaceutical: Lovenox             -antiplatelet therapy: N/A 3. Pain Management: Oxycodone prn              Monitor postoperative pain with increased exertion             really no phantom limb pain at present  11/13- pain controlled- con't regimen at present- is working  11/14- not really taking oxy per pt- con't regimen if needed 4. Mood: Team support             -antipsychotic agents: N/A 5. Neuropsych: This  patient is capable of making decisions on his own behalf. 6. Skin/Wound Care:  -right heel ulcer: foam dressing, PRAFO, ortho has seen also  -left BKA incision: telfa, kerlix, ACE, stump shrinker  -sacrum pressure relief  11/13- will d/c R forearm IV- not using.  7. Fluids/Electrolytes/Nutrition: Monitor intake/output.  Heart healthy diet.             encourage appropriate PO  F/u labs Monday.   8.  T2DM: Monitor BS ac/hs. Continue glyburide 1 mg bid with Levemir 10 units qhs and 15 in am. Novolog 6 TID            10/12 reduce levemir to 10u qam and 5u qpm   -reduce novolog to 3u tid   -  continue glyburide 5mg  bid  11/13- BGs 91-229- 1/2 <150- 1/2 >150- reduced insulin due to low BGs- will wait to make changes for now  11/14- BGs now a little high 126-170s- with 1 episode of 230 at lunch today- suggest to team to increase glyburide.  9. Acute on chronic anemia:              Hb 10-->8.5 on 11/9--> 8.1 today---labs ordered for Monday 11/15 10. Thrombocytopenia             platelets 131k 11/11 11. Renal transplant/CKD IIIb:             Followed by Dr. [name].              BUN/Cr 37/1.95 12. Aortic stenosis/fluid overload: Continue Lasix. Monitor weights and for signs of overload.   Danley Danker Weights   06/12/20 0345 06/13/20 0420 06/14/20 0543  Weight: 107 kg 108.5 kg 109.3 kg    13. Left elbow tendinitis/bursitis: Ice, compression, pressure relief. 14. Constipation  11/14- gave sorbitol for today if no BM by 3pm.         LOS: 4 days A FACE TO FACE EVALUATION WAS PERFORMED  Yedidya Duddy 06/14/2020, 12:29 PM

## 2020-06-15 ENCOUNTER — Inpatient Hospital Stay (HOSPITAL_COMMUNITY): Payer: BC Managed Care – PPO | Admitting: Occupational Therapy

## 2020-06-15 ENCOUNTER — Inpatient Hospital Stay (HOSPITAL_COMMUNITY): Payer: BC Managed Care – PPO

## 2020-06-15 LAB — CBC
HCT: 28.3 % — ABNORMAL LOW (ref 39.0–52.0)
Hemoglobin: 8.7 g/dL — ABNORMAL LOW (ref 13.0–17.0)
MCH: 25.1 pg — ABNORMAL LOW (ref 26.0–34.0)
MCHC: 30.7 g/dL (ref 30.0–36.0)
MCV: 81.8 fL (ref 80.0–100.0)
Platelets: 157 10*3/uL (ref 150–400)
RBC: 3.46 MIL/uL — ABNORMAL LOW (ref 4.22–5.81)
RDW: 15.3 % (ref 11.5–15.5)
WBC: 3.7 10*3/uL — ABNORMAL LOW (ref 4.0–10.5)
nRBC: 0 % (ref 0.0–0.2)

## 2020-06-15 LAB — BASIC METABOLIC PANEL
Anion gap: 9 (ref 5–15)
BUN: 38 mg/dL — ABNORMAL HIGH (ref 8–23)
CO2: 23 mmol/L (ref 22–32)
Calcium: 8.6 mg/dL — ABNORMAL LOW (ref 8.9–10.3)
Chloride: 106 mmol/L (ref 98–111)
Creatinine, Ser: 1.96 mg/dL — ABNORMAL HIGH (ref 0.61–1.24)
GFR, Estimated: 38 mL/min — ABNORMAL LOW (ref >60)
Glucose, Bld: 120 mg/dL — ABNORMAL HIGH (ref 70–99)
Potassium: 4.5 mmol/L (ref 3.5–5.1)
Sodium: 138 mmol/L (ref 135–145)

## 2020-06-15 LAB — GLUCOSE, CAPILLARY
Glucose-Capillary: 96 mg/dL (ref 70–99)
Glucose-Capillary: 71 mg/dL (ref 70–99)
Glucose-Capillary: 216 mg/dL — ABNORMAL HIGH (ref 70–99)
Glucose-Capillary: 205 mg/dL — ABNORMAL HIGH (ref 70–99)

## 2020-06-15 NOTE — Progress Notes (Signed)
Physical Therapy Session Note  Patient Details  Name: Steven [name] MRN: [id number] Date of Birth: Aug 05, 1955  Today's Date: 06/15/2020 PT Individual Time: 0802-0900 and 4967-5916 PT Individual Time Calculation (min): 58 min and 73 min  Short Term Goals: Week 1:  PT Short Term Goal 1 (Week 1): Pt will perform bed mobility with minA PT Short Term Goal 2 (Week 1): Pt will perform bed to chair transfer with minA. PT Short Term Goal 3 (Week 1): Pt will ambulate x25' with minA.  Skilled Therapeutic Interventions/Progress Updates:     1st Session: Pt received supine in bed and agrees to therapy. No complaint of pain. Supine to sit with bed features and verbal cues on positioning at EOB for safety. PT dons TED hose at EOB. Pt performs squat pivot transfer to [name] with minA. Stand pivot to/from elevated toilet seat with minA.   Pt self propels WC with bilateral upper extremities x150' with cues for increased efficiency and arm extension for propulsions. Pt performs seated therex for B upper extremities, including 1x15 overhead presses and 1x15 biceps curls with 11lb weight.  Pt performs sit to stand with RW and minA, then performs 2x5 heel raises with cues for hip extension, weight translation, and dorsiflexor activation.  Pt left seated in WC with all needs within reach.   2nd Session: Pt received seated in [name] and agrees to therapy. No complaint of pain. Pt self propels WC x80' with both arms and PT cues on efficient propulsion technique. PT educates pt on maneuvering WC in and out of elevator. PT provides WC transport outside for energy conservation.  Pt performs multiple reps of sit to stand from [name] with multimodal cueing on body mechanics, hand placement, weight shifting, and motor patterns. Pt able to complete sit to stand 75% of time with close supervision. Occasionally pt does not shift weight far enough forward and is not successful in transfer, and requires increased cueing and minA  from PT to complete.  Pt performs seated triceps press ups in WC 3x10, to strengthen upper body extensor muscles to assist with transfers and mobility. PT cues on body mechanics for correct performance and targeting desired muscle groups.  Pt ambulates 5'x2 with RW and seated rest break. PT provides CGA and close WC follow. PT cues for body mechanics and anterior weight transition, increasing dorsiflexion, hip, and knee extension for improved gait pattern.  WC transport back to room. Squat pivot to recliner with modA. Left seated in recliner with all needs within reach.  Therapy Documentation Precautions:  Precautions Precautions: Fall Precaution Comments: L BKA wound back; watch SpO2/HR Required Braces or Orthoses: Other Brace Other Brace: limb protector Restrictions Weight Bearing Restrictions: Yes LLE Weight Bearing: Non weight bearing   Therapy/Group: Individual Therapy  [name], PT, DPT 06/15/2020, 4:07 PM

## 2020-06-15 NOTE — Progress Notes (Signed)
Occupational Therapy Session Note  Patient Details  Name: Steven [name] MRN: [id number] Date of Birth: 09-28-1955  Today's Date: 06/15/2020 OT Individual Time: 1000-1115 OT Individual Time Calculation (min): 75 min   Short Term Goals: Week 1:  OT Short Term Goal 1 (Week 1): Pt will complete squat pivot transfer w/c<>drop arm commode with CGA OT Short Term Goal 2 (Week 1): Pt will complete LB dressing with CGA OT Short Term Goal 3 (Week 1): Pt will complete UB/LB bathing with CGA OT Short Term Goal 4 (Week 1): Pt will achieve 0 degrees left elbow passive and active extension.  Skilled Therapeutic Interventions/Progress Updates:    Pt greeted seated in wc and agreeable to OT treatment session. Pt reported already washing up this morning and declined further bathing/dressing. Pt agreeable to try to stand at the sink for toothbrushing task. Pt able to complete sit><stand with min A. OT educated on balance strategies to increase stability so he could use one hand for task. Pt only tolerated standing for 30 seconds and did not feel safe attempting to stand longer 2/2 R LE fatigue. Pt completed further grooming tasks in sitting at the sink. Discussed home bathroom set-up and DME needs at length with pt. Pt reports grab bars and handicap set-up, but he will need a tub transfer bench and drop arm BSC for increased access and safety with bathroom transfers. Pt propelled wc to therapy gym and completed 10 minutes on UE Ergometer. Pt needed multiple rest breaks however 2/2 fatigue. Pt then completed 5 sit<>stands using RW and overall min A to achieve full upright and extended rest breaks after standing.  Pt propelled wc back to room and left seated with chair alarm on, call bell in reach, and needs met.   Therapy Documentation Precautions:  Precautions Precautions: Fall Precaution Comments: L BKA wound back; watch SpO2/HR Required Braces or Orthoses: Other Brace Other Brace: limb  protector Restrictions Weight Bearing Restrictions: Yes LLE Weight Bearing: Non weight bearing Pain: Pain Assessment Pain Scale: 0-10 Pain Score: 0-No pain ADL: ADL Grooming: Setup Where Assessed-Grooming: Sitting at sink Upper Body Bathing: Setup Where Assessed-Upper Body Bathing: Sitting at sink Lower Body Bathing: Minimal assistance Where Assessed-Lower Body Bathing: Sitting at sink, Standing at sink Upper Body Dressing: Setup Where Assessed-Upper Body Dressing: Sitting at sink Where Assessed-Lower Body Dressing: Sitting at sink, Standing at sink Toilet Transfer Method: Squat pivot Toilet Transfer Equipment: Drop arm bedside commode   Therapy/Group: Individual Therapy  Valma Cava 06/15/2020, 11:33 AM

## 2020-06-15 NOTE — Progress Notes (Signed)
[place] PHYSICAL MEDICINE & REHABILITATION PROGRESS NOTE   Subjective/Complaints: No complaints this morning Working hard with therapy When asked, notes residual limb pain that is controlled with medication Has started moving bowels yesterday  ROS:   Pt denies SOB, abd pain, CP, N/V/C/D, and vision changes  Objective:   No results found. Recent Labs    06/15/20 0815  WBC 3.7*  HGB 8.7*  HCT 28.3*  PLT 157   Recent Labs    06/15/20 0815  NA 138  K 4.5  CL 106  CO2 23  GLUCOSE 120*  BUN 38*  CREATININE 1.96*  CALCIUM 8.6*    Intake/Output Summary (Last 24 hours) at 06/15/2020 1110 Last data filed at 06/15/2020 0700 Gross per 24 hour  Intake 720 ml  Output 450 ml  Net 270 ml     Pressure Injury 06/08/20 Coccyx Stage 1 -  Intact skin with non-blanchable redness of a localized area usually over a bony prominence. (Active)  06/08/20 1500  Location: Coccyx  Location Orientation:   Staging: Stage 1 -  Intact skin with non-blanchable redness of a localized area usually over a bony prominence.  Wound Description (Comments):   Present on Admission: Yes (present upon transfer)    Physical Exam: Vital Signs Blood pressure (!) 144/67, pulse 77, temperature 98.9 F (37.2 C), temperature source Oral, resp. rate 18, height 5\' 11"  (1.803 m), weight 103.5 kg, SpO2 99 %. General: Alert and oriented x 3, No apparent distress HEENT: Head is normocephalic, atraumatic, PERRLA, EOMI, sclera anicteric, oral mucosa pink and moist, dentition intact, ext ear canals clear,  Neck: Supple without JVD or lymphadenopathy Heart: Reg rate and rhythm. No murmurs rubs or gallops Chest: CTA bilaterally without wheezes, rales, or rhonchi; no distress Abdomen: Soft, non-tender, non-distended, bowel sounds positive. Extremities: No clubbing, cyanosis, or edema. Pulses are 2+ Skin: small stage 1 sacrum. Ertyhema and sanguinous drainage along lateral aspects of incision. Incision well  approximated still. Right heel wound with eschar--unchanged. Vascular changes RLE Neuro: Pt is cognitively appropriate with normal insight, memory, and awareness. Cranial nerves 2-12 are intact. Sensory exam is normal. Reflexes are 2+ in all 4's. Fine motor coordination is intact. No tremors. Motor function is grossly 5/5.  Musculoskeletal: left BK with mild tenderness to palpation. Stump remains edematous. Full knee extension   Assessment/Plan: 1. Functional deficits which require 3+ hours per day of interdisciplinary therapy in a comprehensive inpatient rehab setting.  Physiatrist is providing close team supervision and 24 hour management of active medical problems listed below.  Physiatrist and rehab team continue to assess barriers to discharge/monitor patient progress toward functional and medical goals  Care Tool:  Bathing    Body parts bathed by patient: Right arm, Left arm, Chest, Abdomen, Front perineal area, Buttocks, Right upper leg, Left upper leg, Right lower leg, Face         Bathing assist Assist Level: Minimal Assistance - Patient > 75% (for sit <>stand to bathe buttocks at sink)     Upper Body Dressing/Undressing Upper body dressing   What is the patient wearing?: Pull over shirt    Upper body assist Assist Level: Set up assist    Lower Body Dressing/Undressing Lower body dressing      What is the patient wearing?: Pants     Lower body assist Assist for lower body dressing: Moderate Assistance - Patient 50 - 74%     Toileting Toileting    Toileting assist Assist for toileting: Supervision/Verbal cueing Assistive  Device Comment: urinal   Transfers Chair/bed transfer  Transfers assist     Chair/bed transfer assist level: Contact Guard/Touching assist     Locomotion Ambulation   Ambulation assist      Assist level: Contact Guard/Touching assist Assistive device: Walker-rolling Max distance: 5 ft   Walk 10 feet activity   Assist  Walk  10 feet activity did not occur: Safety/medical concerns        Walk 50 feet activity   Assist Walk 50 feet with 2 turns activity did not occur: Safety/medical concerns         Walk 150 feet activity   Assist Walk 150 feet activity did not occur: Safety/medical concerns         Walk 10 feet on uneven surface  activity   Assist Walk 10 feet on uneven surfaces activity did not occur: Safety/medical concerns         Wheelchair     Assist Will patient use wheelchair at discharge?: Yes Type of Wheelchair: Manual Wheelchair activity did not occur: Safety/medical concerns  Wheelchair assist level: Supervision/Verbal cueing Max wheelchair distance: 130 ft    Wheelchair 50 feet with 2 turns activity    Assist    Wheelchair 50 feet with 2 turns activity did not occur: Safety/medical concerns   Assist Level: Supervision/Verbal cueing   Wheelchair 150 feet activity     Assist  Wheelchair 150 feet activity did not occur: Safety/medical concerns   Assist Level: Supervision/Verbal cueing   Blood pressure (!) 144/67, pulse 77, temperature 98.9 F (37.2 C), temperature source Oral, resp. rate 18, height 5\' 11"  (1.803 m), weight 103.5 kg, SpO2 99 %. Medical Problem List and Plan: 1. Deficits with mobility, transfers, endurance, self-care secondary to left BKA secondary to osteomyelitis.             -patient may shower with stump covered             -ELOS/Goals: 10-15 days/supervision/mod I             --Continue CIR therapies including PT, OT  2.  Antithrombotics: -DVT/anticoagulation:  Pharmaceutical: Lovenox             -antiplatelet therapy: N/A 3. Pain Management: Oxycodone prn              Monitor postoperative pain with increased exertion             really no phantom limb pain at present  11/15: pain is well controlled.  4. Mood: Team support             -antipsychotic agents: N/A 5. Neuropsych: This patient is capable of making decisions on his  own behalf. 6. Skin/Wound Care:  -right heel ulcer: foam dressing, PRAFO, ortho has seen also  -left BKA incision: telfa, kerlix, ACE, stump shrinker  -sacrum pressure relief  11/13- will d/c R forearm IV- not using.  7. Fluids/Electrolytes/Nutrition: Monitor intake/output.  Heart healthy diet.             encourage appropriate PO  Electrolytes are stable 11/15 8.  T2DM: Monitor BS ac/hs. Continue glyburide 1 mg bid with Levemir 10 units qhs and 15 in am. Novolog 6 TID            10/12 reduce levemir to 10u qam and 5u qpm   -reduce novolog to 3u tid   -continue glyburide 5mg  bid  11/13- BGs 91-229- 1/2 <150- 1/2 >150- reduced insulin due to low BGs- will wait to  make changes for now  11/14- BGs now a little high 126-170s- with 1 episode of 230 at lunch today- suggest to team to increase glyburide.  9. Acute on chronic anemia:              Hb 10-->8.5 on 11/9--> 8.1 today---labs ordered for Monday 11/15 10. Thrombocytopenia             platelets 131k 11/11 11. Renal transplant/CKD IIIb:             Followed by Dr. [name].              BUN/Cr 37/1.95, Cr is 1.96 on 11/15 12. Aortic stenosis/fluid overload: Continue Lasix. Monitor weights and for signs of overload.   Danley Danker Weights   06/13/20 0420 06/14/20 0543 06/15/20 0331  Weight: 108.5 kg 109.3 kg 103.5 kg    13. Left elbow tendinitis/bursitis: Ice, compression, pressure relief. 14. Constipation  11/15: had BM with sorbitol on 11/14        LOS: 5 days A FACE TO Jack 06/15/2020, 11:10 AM

## 2020-06-16 ENCOUNTER — Inpatient Hospital Stay (HOSPITAL_COMMUNITY): Payer: BC Managed Care – PPO | Admitting: Occupational Therapy

## 2020-06-16 ENCOUNTER — Inpatient Hospital Stay (HOSPITAL_COMMUNITY): Payer: BC Managed Care – PPO | Admitting: Physical Therapy

## 2020-06-16 ENCOUNTER — Inpatient Hospital Stay (HOSPITAL_COMMUNITY): Payer: BC Managed Care – PPO

## 2020-06-16 LAB — GLUCOSE, CAPILLARY
Glucose-Capillary: 126 mg/dL — ABNORMAL HIGH (ref 70–99)
Glucose-Capillary: 146 mg/dL — ABNORMAL HIGH (ref 70–99)
Glucose-Capillary: 133 mg/dL — ABNORMAL HIGH (ref 70–99)
Glucose-Capillary: 125 mg/dL — ABNORMAL HIGH (ref 70–99)

## 2020-06-16 MED ORDER — GLYBURIDE 5 MG PO TABS
5.0000 mg | ORAL_TABLET | Freq: Two times a day (BID) | ORAL | Status: DC
  Administered 2020-06-16 – 2020-06-24 (×16): 5 mg via ORAL
  Filled 2020-06-16 (×16): qty 1

## 2020-06-16 MED ORDER — GLYBURIDE 5 MG PO TABS: 10.0000 mg | ORAL_TABLET | Freq: Two times a day (BID) | ORAL | Status: DC

## 2020-06-16 MED ORDER — INSULIN ASPART 100 UNIT/ML ~~LOC~~ SOLN
6.0000 [IU] | Freq: Three times a day (TID) | SUBCUTANEOUS | Status: DC
  Administered 2020-06-16 – 2020-06-18 (×6): 6 [IU] via SUBCUTANEOUS

## 2020-06-16 NOTE — Progress Notes (Signed)
Physical Therapy Session Note  Patient Details  Name: Steven [name] MRN: [id number] Date of Birth: March 24, 1956  Today's Date: 06/16/2020 PT Individual Time: 8315-1761 PT Individual Time Calculation (min): 69 min   Short Term Goals: Week 1:  PT Short Term Goal 1 (Week 1): Pt will perform bed mobility with minA PT Short Term Goal 2 (Week 1): Pt will perform bed to chair transfer with minA. PT Short Term Goal 3 (Week 1): Pt will ambulate x25' with minA.  Skilled Therapeutic Interventions/Progress Updates:    Pt received sitting in recliner, agreeable to PT session, does not report any pain. NT present for routine vitals. Pt asking about team conference and wanting to know DC date. Informed him of date (set on team sticky note), pt reporting he was hopeful he would leave sooner than that. Educated him on current mobility status and barriers to DC, as well as PT goals that were set for him that he has yet to achieve; pt more understanding. Performed stand<>pivot transfer with minA from recliner to w/c, towards stronger R side, cues for safety approach and general technique. Pt propelled himself ~15ft + 45ft with supervision in his w/c, demo's decreased stroke efficiency and c/o B shldr fatigue with distance. Performed blocked practice squat<>pivot transfers from w/c to mat table with focus on "checklist" and general safety awareness. Squat<>pivot transfers fluctuated from [place] to [place], increased difficulty transferring towards L than R. He also performed lateral scooting L<>R along mat table with close supervision, emphasizing hip clearance and hip translation with efficient efforts. Performed upper extremity strengthening, seated bicep curls/shldr flex to 90, and tricep press (1x15 with 5# dowel rod). He also completed sit<>stand transfers with minA and RW, performing 2x10 static standing hopping focusing on RLE power and foot clearance. Ended session by performing standing balance with/without  upper extremity support on RW, minA guard for balance. He was able to maintain single leg stance on RLE for brief periods of time (~5 seconds). Squat<>pivot transfer with minA back to his w/c and he propelled himself back to his room ~139ft, with supervision. Performed stand<>pivot transfer with minA and RW from w/c to recliner. He remained seated in recliner with BLE elevated, chair alarm on, needs in reach.   Therapy Documentation Precautions:  Precautions Precautions: Fall Precaution Comments: L BKA wound back; watch SpO2/HR Required Braces or Orthoses: Other Brace Other Brace: limb protector Restrictions Weight Bearing Restrictions: Yes LLE Weight Bearing: Non weight bearing  Therapy/Group: Individual Therapy  [name] Karrah Mangini PT 06/16/2020, 3:25 PM

## 2020-06-16 NOTE — Progress Notes (Signed)
[facility] [facility] PHYSICAL MEDICINE & REHABILITATION PROGRESS NOTE   Subjective/Complaints: Tolerated therapy well yesterday but required multiple rest breaks due to fatigue Labs stable yesterday Was told by nephrologist not to take Aspirin after renal transplant and so I have discontinued it  ROS:   Pt denies SOB, abd pain, CP, N/V/C/D, and vision changes  Objective:   No results found. Recent Labs    06/15/20 0815  WBC 3.7*  HGB 8.7*  HCT 28.3*  PLT 157   Recent Labs    06/15/20 0815  NA 138  K 4.5  CL 106  CO2 23  GLUCOSE 120*  BUN 38*  CREATININE 1.96*  CALCIUM 8.6*    Intake/Output Summary (Last 24 hours) at 06/16/2020 1025 Last data filed at 06/15/2020 2300 Gross per 24 hour  Intake 500 ml  Output 400 ml  Net 100 ml     Pressure Injury 06/08/20 Coccyx Stage 1 -  Intact skin with non-blanchable redness of a localized area usually over a bony prominence. (Active)  06/08/20 1500  Location: Coccyx  Location Orientation:   Staging: Stage 1 -  Intact skin with non-blanchable redness of a localized area usually over a bony prominence.  Wound Description (Comments):   Present on Admission: Yes (present upon transfer)    Physical Exam: Vital Signs Blood pressure 136/68, pulse 75, temperature 98.1 F (36.7 C), temperature source Oral, resp. rate 16, height 5\' 11"  (1.803 m), weight 107.8 kg, SpO2 100 %.  General: Alert and oriented x 3, No apparent distress HEENT: Head is normocephalic, atraumatic, PERRLA, EOMI, sclera anicteric, oral mucosa pink and moist, dentition intact, ext ear canals clear,  Neck: Supple without JVD or lymphadenopathy Heart: Reg rate and rhythm. No murmurs rubs or gallops Chest: CTA bilaterally without wheezes, rales, or rhonchi; no distress Abdomen: Soft, non-tender, non-distended, bowel sounds positive. Extremities: No clubbing, cyanosis, or edema. Pulses are 2+  Skin: small stage 1 sacrum. Ertyhema and sanguinous drainage along lateral  aspects of incision. Incision well approximated still. Right heel wound with eschar--unchanged. Vascular changes RLE Neuro: Pt is cognitively appropriate with normal insight, memory, and awareness. Cranial nerves 2-12 are intact. Sensory exam is normal. Reflexes are 2+ in all 4's. Fine motor coordination is intact. No tremors. Motor function is grossly 5/5.  Musculoskeletal: left BK with mild tenderness to palpation. Stump remains edematous. Full knee extension   Assessment/Plan: 1. Functional deficits which require 3+ hours per day of interdisciplinary therapy in a comprehensive inpatient rehab setting.  Physiatrist is providing close team supervision and 24 hour management of active medical problems listed below.  Physiatrist and rehab team continue to assess barriers to discharge/monitor patient progress toward functional and medical goals  Care Tool:  Bathing    Body parts bathed by patient: Right arm, Left arm, Chest, Abdomen, Front perineal area, Buttocks, Right upper leg, Left upper leg, Right lower leg, Face         Bathing assist Assist Level: Minimal Assistance - Patient > 75% (for sit <>stand to bathe buttocks at sink)     Upper Body Dressing/Undressing Upper body dressing   What is the patient wearing?: Pull over shirt    Upper body assist Assist Level: Set up assist    Lower Body Dressing/Undressing Lower body dressing      What is the patient wearing?: Pants     Lower body assist Assist for lower body dressing: Moderate Assistance - Patient 50 - 74%     Toileting Toileting  Toileting assist Assist for toileting: Supervision/Verbal cueing Assistive Device Comment: urinal   Transfers Chair/bed transfer  Transfers assist     Chair/bed transfer assist level: Minimal Assistance - Patient > 75%     Locomotion Ambulation   Ambulation assist      Assist level: Contact Guard/Touching assist Assistive device: Walker-rolling Max distance: 5 ft    Walk 10 feet activity   Assist  Walk 10 feet activity did not occur: Safety/medical concerns        Walk 50 feet activity   Assist Walk 50 feet with 2 turns activity did not occur: Safety/medical concerns         Walk 150 feet activity   Assist Walk 150 feet activity did not occur: Safety/medical concerns         Walk 10 feet on uneven surface  activity   Assist Walk 10 feet on uneven surfaces activity did not occur: Safety/medical concerns         Wheelchair     Assist Will patient use wheelchair at discharge?: Yes Type of Wheelchair: Manual Wheelchair activity did not occur: Safety/medical concerns  Wheelchair assist level: Supervision/Verbal cueing Max wheelchair distance: 150'    Wheelchair 50 feet with 2 turns activity    Assist    Wheelchair 50 feet with 2 turns activity did not occur: Safety/medical concerns   Assist Level: Supervision/Verbal cueing   Wheelchair 150 feet activity     Assist  Wheelchair 150 feet activity did not occur: Safety/medical concerns   Assist Level: Supervision/Verbal cueing   Blood pressure 136/68, pulse 75, temperature 98.1 F (36.7 C), temperature source Oral, resp. rate 16, height 5\' 11"  (1.803 m), weight 107.8 kg, SpO2 100 %. Medical Problem List and Plan: 1. Deficits with mobility, transfers, endurance, self-care secondary to left BKA secondary to osteomyelitis.             -patient may shower with stump covered             -ELOS/Goals: 10-15 days/supervision/mod I             --Continue CIR therapies including PT, OT  2.  Antithrombotics: -DVT/anticoagulation:  Pharmaceutical: Lovenox             -antiplatelet therapy: N/A 3. Pain Management: Oxycodone prn              Monitor postoperative pain with increased exertion             really no phantom limb pain at present  11/16: pain is well controlled. Used 1 oxycodone yesterday.   4. Mood: Team support             -antipsychotic agents:  N/A 5. Neuropsych: This patient is capable of making decisions on his own behalf. 6. Skin/Wound Care:  -right heel ulcer: foam dressing, PRAFO, ortho has seen also  -left BKA incision: telfa, kerlix, ACE, stump shrinker  -sacrum pressure relief  11/13- will d/c R forearm IV- not using.   11/16: continue limb protector 7. Fluids/Electrolytes/Nutrition: Monitor intake/output.  Heart healthy diet.             encourage appropriate PO  Electrolytes are stable 11/15 8.  T2DM: Monitor BS ac/hs. Continue glyburide 1 mg bid with Levemir 10 units qhs and 15 in am. Novolog 6 TID            10/12 reduce levemir to 10u qam and 5u qpm   -reduce novolog to 3u tid   -continue glyburide  5mg  bid  11/13- BGs 91-229- 1/2 <150- 1/2 >150- reduced insulin due to low BGs- will wait to make changes for now  11/14- BGs now a little high 126-170s- with 1 episode of 230 at lunch today- suggest to team to increase glyburide.  11/15: CBG elevated to 216 yesterday afternoon.  Increase Glyburide to 10mg  BID.  9. Acute on chronic anemia:              Hb 10-->8.5 on 11/9--> 8.1 today---labs ordered for Monday 11/15 10. Thrombocytopenia             platelets 131k 11/11 11. Renal transplant/CKD IIIb:             Followed by Dr. [name].              BUN/Cr 37/1.95, Cr is 1.96 on 11/15  -Aspirin discontinued as patient says he was told by nephrologist not to take this 12. Aortic stenosis/fluid overload: Continue Lasix. Monitor weights and for signs of overload.   Steven [name] Weights   06/14/20 0543 06/15/20 0331 06/16/20 0513  Weight: 109.3 kg 103.5 kg 107.8 kg    13. Left elbow tendinitis/bursitis: Ice, compression, pressure relief. 14. Constipation  11/15: had BM with sorbitol on 11/14        LOS: 6 days A FACE TO FACE EVALUATION WAS PERFORMED  Steven [name] 06/16/2020, 8:37 AM

## 2020-06-16 NOTE — Progress Notes (Signed)
Occupational Therapy Session Note  Patient Details  Name: Steven [name] MRN: [id number] Date of Birth: 1955-10-19  Today's Date: 06/16/2020 OT Individual Time: 1002-1030 OT Individual Time Calculation (min): 28 min   Short Term Goals: Week 1:  OT Short Term Goal 1 (Week 1): Pt will complete squat pivot transfer w/c<>drop arm commode with CGA OT Short Term Goal 2 (Week 1): Pt will complete LB dressing with CGA OT Short Term Goal 3 (Week 1): Pt will complete UB/LB bathing with CGA OT Short Term Goal 4 (Week 1): Pt will achieve 0 degrees left elbow passive and active extension.  Skilled Therapeutic Interventions/Progress Updates:    Pt greeted seated in wc and agreeable to OT treatment session focused on self-care retraining. Pt reported he had already washed lower body in earlier OT session, but did not have time to don clothing. Pt propelled wc to the sink and completed UB bathing from wc with set-up A. Worked on LB dressing strategies with pt needing min A to thread R LE through pants. Sit<>stand at the sink with CGA and CGA for balance when alternating UEs to pull up pants. Pt left seated in wc at end of session with needs met.   Therapy Documentation Precautions:  Precautions Precautions: Fall Precaution Comments: L BKA wound back; watch SpO2/HR Required Braces or Orthoses: Other Brace Other Brace: limb protector Restrictions Weight Bearing Restrictions: Yes LLE Weight Bearing: Non weight bearing Pain: Pain Assessment Pain Scale: 0-10 Pain Score: 0-No pain   Therapy/Group: Individual Therapy  Valma Cava 06/16/2020, 10:58 AM

## 2020-06-16 NOTE — Progress Notes (Signed)
Occupational Therapy Session Note  Patient Details  Name: Steven [name] MRN: [id number] Date of Birth: 22-Nov-1955  Today's Date: 06/16/2020 OT Individual Time: 1002-1030 OT Individual Time Calculation (min): 28 min    Short Term Goals: Week 1:  OT Short Term Goal 1 (Week 1): Pt will complete squat pivot transfer w/c<>drop arm commode with CGA OT Short Term Goal 2 (Week 1): Pt will complete LB dressing with CGA OT Short Term Goal 3 (Week 1): Pt will complete UB/LB bathing with CGA OT Short Term Goal 4 (Week 1): Pt will achieve 0 degrees left elbow passive and active extension.      Skilled Therapeutic Interventions/Progress Updates:    pt received in bed stating he felt much better today with minimal to no discomfort.  He demonstrated rolling and sitting up with use of the rail with S. Recommended tomorrow to not use the rail, but pt explained he has a heavy credenza at the side of the bed he uses for support to push up on. Pt sat at EOB and discussed medications with MD. We also discussed phantom pain and precautions with reaching to floor level on his L side.  He completed squat pivot to his L with CGA using excellent technique.   Sit to stand at sink with alternating hands to doff pants. Cued pt to not let go with B hands and try to balance.  He doffed over feet and then stood again for at least 1 minute to wash his bottom.  Pt requested I remove Alleyvn patch. He continues to have some redness.  Pt waited to don pant as his RN needed to look at his skin and was about to come in to redress his L limb.   Pt completed all UB self care at the sink.  Sitting in wc with all needs met.  Therapy Documentation Precautions:  Precautions Precautions: Fall Precaution Comments: L BKA wound back; watch SpO2/HR Required Braces or Orthoses: Other Brace Other Brace: limb protector Restrictions Weight Bearing Restrictions: Yes LLE Weight Bearing: Non weight bearing       Pain: Pain  Assessment Pain Scale: 0-10 Pain Score: 0-No pain ADL: ADL Grooming: Setup Where Assessed-Grooming: Sitting at sink Upper Body Bathing: Setup Where Assessed-Upper Body Bathing: Sitting at sink Lower Body Bathing: Minimal assistance Where Assessed-Lower Body Bathing: Sitting at sink, Standing at sink Upper Body Dressing: Setup Where Assessed-Upper Body Dressing: Sitting at sink Where Assessed-Lower Body Dressing: Sitting at sink, Standing at sink Toilet Transfer Method: Squat pivot Toilet Transfer Equipment: Drop arm bedside commode   Therapy/Group: Individual Therapy  [place] 06/16/2020, 12:16 PM

## 2020-06-16 NOTE — Patient Care Conference (Signed)
Inpatient RehabilitationTeam Conference and Plan of Care Update Date: 06/16/2020   Time: 10:31 AM    Patient Name: Steven [name]      Medical Record Number: [id number]  Date of Birth: September 05, 1955 Sex: Male         Room/Bed: 4M03C/4M03C-01 Payor Info: Payor: Time / Plan: [place] PPO / Product Type: *No Product type* /    Admit Date/Time:  06/10/2020  3:48 PM  Primary Diagnosis:  S/P BKA (below knee amputation) unilateral, left [facility])  Hospital Problems: Principal Problem:   S/P BKA (below knee amputation) unilateral, left ([place]) Active Problems:   Below-knee amputation of left lower extremity [facility])    Expected Discharge Date: Expected Discharge Date: 06/24/20  Team Members Present: Physician leading conference: Dr. Leeroy Cha Care Coodinator Present: Loralee Pacas, LCSWA;Buddy Loeffelholz Creig Hines, RN, BSN, [place] Nurse Present: Suella Grove, RN PT Present: Apolinar Junes, PT OT Present: Cherylynn Ridges, OT PPS Coordinator present : Gunnar Fusi, Novella Olive, PT     Current Status/Progress Goal Weekly Team Focus  Bowel/Bladder   Continent B/B LBM 06/14/2020  maintain continence without constipation  assess bowel/urinary pattern and promote independence   Swallow/Nutrition/ Hydration             ADL's   Min A LB ADLs, min A toilating, Min/CGA sit<>stands, min/mod A squat-pivot transfers  Supervision/CGA overall  transfers, self-care retraining, residual limb care, sit<>stands   Mobility   supervision bed mobility, CGA sit to stand transfer with RW, min/modA squat pivot transfer without AD, CGA gait x5' with RW  Supervision, Gait up to 30' with RW  Consistency of squat pivot and sit to stand transfers, global strengthening, standing balance, ambulation, endurance   Communication             Safety/Cognition/ Behavioral Observations            Pain   intermittant Left BKA pain, received tramadol 50mg  2x and oxycodone 1x since 06/10/20   to be pain free  attempt to anticipate pain before becoming extreme   Skin   right heel diabetic wound, Left BKA surgical incision  wound healing  prevent wound infection, promote healing     Discharge Planning:  D/c to home with his wife (seperated), support from older children, and is parents .   Team Discussion: Continent B/B, incision looks good with some bleeding noted, stage I to the buttocks, diabetic ulcer to the right heel. OT reports patient is min assist to contact guard. Goals are set for supervision to contact guard overall. PT reports patient has supervision goals for mobility. MD reports aspirin as been discontinued d/t history of renal transplant. CBG's have been elevated, insulin is being adjusted as needed. Patient on target to meet rehab goals: yes  *See Care Plan and progress notes for long and short-term goals.   Revisions to Treatment Plan:  Aspirin discontinued, insulin being adjusted as needed.  Teaching Needs: Continue family education, diabetes education, wound care  Current Barriers to Discharge: Inaccessible home environment, Decreased caregiver support, Home enviroment access/layout, Wound care, Lack of/limited family support, Weight bearing restrictions, Medication compliance and Behavior  Possible Resolutions to Barriers: Continue family education, diabetes education and management, continue current medication regimen, education on safety awareness and weight bearing precautions, education on wound care and dressing changes.      Medical Summary Current Status: Pain is well controlled, Separated from his wife, bleeding from residual limb, no purulence, diabetic heel ulcer, sacral pressure injury  Barriers to Discharge: Medical stability;Decreased family/caregiver support;Wound care  Barriers to Discharge Comments: Separated from his wife, bleeding from residual limb, diabetic heel ulcer, sacral pressure injury Possible Resolutions to [employer] Focus:  Daily monitoring of residual limb/diabetic heel ulcer/sacral pressure injury, caregiver training, monitor CBGs HS/AC and increase insulin   Continued Need for Acute Rehabilitation Level of Care: The patient requires daily medical management by a physician with specialized training in physical medicine and rehabilitation for the following reasons: Direction of a multidisciplinary physical rehabilitation program to maximize functional independence : Yes Medical management of patient stability for increased activity during participation in an intensive rehabilitation regime.: Yes Analysis of laboratory values and/or radiology reports with any subsequent need for medication adjustment and/or medical intervention. : Yes   I attest that I was present, lead the team conference, and concur with the assessment and plan of the team.   Cristi Loron 06/16/2020, 2:24 PM

## 2020-06-16 NOTE — Progress Notes (Signed)
Physical Therapy Session Note  Patient Details  Name: Steven [name] MRN: [id number] Date of Birth: 03-07-1956  Today's Date: 06/16/2020 PT Individual Time: 1120-1200 PT Individual Time Calculation (min): 40 min   Short Term Goals: Week 1:  PT Short Term Goal 1 (Week 1): Pt will perform bed mobility with minA PT Short Term Goal 2 (Week 1): Pt will perform bed to chair transfer with minA. PT Short Term Goal 3 (Week 1): Pt will ambulate x25' with minA.  Skilled Therapeutic Interventions/Progress Updates: Pt presented in w/c agreeable to therapy. Pt denies pain during session. Session focused on w/c mobility in community environment as well as endurance with BUE strengthening. Pt propelled w/c to elevators and PTA assisted pt turning into elevator. Pt then propelled in [facility] requiring several rest breaks but able to propel on average approx 113f before requiring a rest break. Pt demonstrated good safety around obstacles with minimal cueing. [facility] more frequent rest breaks due to varying grades and was able to use BUE on w/c rims to control speed when going on downgrade.During rest breaks discussed creation of HEP and incorporating hip flexor stretching into HEP with pt verbalizing understanding. Pt propelled back through [facility] to elevators with PTA assisting pt onto elevator. Pt propelled back to room and performed stand pivot transfer to recliner with CGA overall. Pt positioned to comfort and left with seat alarm on, call bell within reach and needs met.      Therapy Documentation Precautions:  Precautions Precautions: Fall Precaution Comments: L BKA wound back; watch SpO2/HR Required Braces or Orthoses: Other Brace Other Brace: limb protector Restrictions Weight Bearing Restrictions: Yes LLE Weight Bearing: Non weight bearing General:   Vital Signs: Therapy Vitals Temp: 98.3 F (36.8 C) Temp Source: Oral Pulse Rate: 78 Resp: 16 BP:  (!) 131/58 Patient Position (if appropriate): Sitting Oxygen Therapy SpO2: 100 % O2 Device: Room Air Pain: Pain Assessment Pain Scale: 0-10 Pain Score: 5  Pain Type: Acute pain Pain Location: Leg Pain Orientation: Left Pain Descriptors / Indicators: Aching Pain Frequency: Intermittent Pain Onset: Gradual Patients Stated Pain Goal: 2 Pain Intervention(s): Medication (See eMAR)   Therapy/Group: Individual Therapy  Thirza Pellicano  [place] Deitrick, PTA  06/16/2020, 3:42 PM

## 2020-06-17 ENCOUNTER — Inpatient Hospital Stay (HOSPITAL_COMMUNITY): Payer: BC Managed Care – PPO

## 2020-06-17 ENCOUNTER — Inpatient Hospital Stay (HOSPITAL_COMMUNITY): Payer: BC Managed Care – PPO | Admitting: Occupational Therapy

## 2020-06-17 ENCOUNTER — Inpatient Hospital Stay (HOSPITAL_COMMUNITY): Payer: BC Managed Care – PPO | Admitting: Physical Therapy

## 2020-06-17 ENCOUNTER — Encounter (HOSPITAL_COMMUNITY): Payer: BC Managed Care – PPO | Admitting: Psychology

## 2020-06-17 LAB — GLUCOSE, CAPILLARY
Glucose-Capillary: 151 mg/dL — ABNORMAL HIGH (ref 70–99)
Glucose-Capillary: 86 mg/dL (ref 70–99)
Glucose-Capillary: 57 mg/dL — ABNORMAL LOW (ref 70–99)
Glucose-Capillary: 103 mg/dL — ABNORMAL HIGH (ref 70–99)
Glucose-Capillary: 102 mg/dL — ABNORMAL HIGH (ref 70–99)

## 2020-06-17 MED ORDER — ASPIRIN EC 81 MG PO TBEC
81.0000 mg | DELAYED_RELEASE_TABLET | Freq: Every day | ORAL | Status: DC
  Administered 2020-06-18 – 2020-06-24 (×7): 81 mg via ORAL
  Filled 2020-06-17 (×7): qty 1

## 2020-06-17 MED ORDER — ACETAMINOPHEN-CODEINE #3 300-30 MG PO TABS
1.0000 | ORAL_TABLET | ORAL | Status: DC | PRN
  Administered 2020-06-17 – 2020-06-23 (×6): 1 via ORAL
  Filled 2020-06-17 (×6): qty 1

## 2020-06-17 NOTE — Consult Note (Signed)
Neuropsychological Consultation   Patient:   Steven [name]   DOB:   1956-03-05  MR Number:  [id number]  Location:  [facility] [address] CENTER [address] [place] [id number] [place] [address] Dept: [place]: [phone]           Date of Service:   06/17/2020  Start Time:   1 PM End Time:   2 PM  Provider/Observer:  Ilean Skill, Psy.D.       Clinical Neuropsychologist       Billing Code/Service: 36144  Chief Complaint:    Steven [name] is a 64 year old male with history of type 2 diabetes, OSA, hypertension, renal transplant 2006.  Patient was admitted on 06/01/2020 with worsening left foot ulcer.  MRI of left foot revealed osteomyelitis with abscess and patient underwent left BKA on 06/03/2020.  On 06/05/2019 when he developed hypoxia and dyspnea as well as chest pressure due to pulmonary edema.  He was treated with IV diuretics.  Blood sugars have been poorly controlled in the past and difficult to control presently.  Acute on chronic renal failure improving.  Patient has had healing well from his surgical interventions and wound VAC has been discontinued.  Patient has been limited prior due to left elbow pain and weakness as well as shortness of breath with activity.  CIR was recommended due to functional decline.  Reason for Service:  Patient was referred for neuropsychological consultation due to adjustment coping issues and admitting to depressive symptomatology prior to recent admission and surgical intervention.  The patient has been dealing with a lot of medical complications overall and acute issues with his foot.  Below is the HPI for the current admission.  HPI: Steven [name] is a 64 year old male with history of T2DM, OSA, HTN, renal transplant 2006 who was admitted on 06/01/2020 with worsening left foot ulcer, despite abx. History taken from chart review and patient. MRI of left foot revealed  osteomyelitis with abscess and patient underwent left BKA on 06/03/2020 by Dr. Sharol Given.  On 06/04/2020 he developed hypoxia and dyspnea as well as chest pressure due to pulmonary edema. He was treated with IV diuresis. 2D echocardiogram showed severely calcified aortic and mitral valves with severe aortic stenosis, ejection fraction of 60-65% with moderate concentric left ventricular hypertrophy.  Dr. Burt Knack consulted for input on severe AS and patient underwent cardiac cath for evaluation of potential TVAR. 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 Rest of TVAR work up to be done on outpatient basis and recommendations are to carefully escalate diuresis if renal function remains stable.   Blood sugars have been poorly controlled. Acute on chronic renal failure improving with BUN/SCr 47/2.49-->30/1.7. Acute blood loss anemia stable. Platelets trending down. Wound VAC was DC'd. Therapy ongoing and patient limited by left elbow pain, weakness as well as SOB with activity. CIR recommended due to functional decline. Please see preadmission assessment from earlier today as well.  Current Status:  Upon entering the room, the patient was sitting in a recliner chair in the upright position.  He was alert and oriented and euthymic in presentation.  Patient described improving of mood with greater functional understanding and completion of transfer tasks.  Patient admitted to prior issues with depression and that he has essentially stopped taking care of himself like he needed to be doing and stopped engaging  outside of his home.  He reported it was a combination of Covid restrictions and/or his medical status leading to this significant withdrawal from social activities.  Patient reports that his mood has been better recently and is motivated to actively work on therapeutic interventions going forward.  He acknowledged  hesitancy in some ways with regard to self-care prior and associated with depressive symptomatology.  Patient denies any current depression type symptoms and is feeling more positive about his outcome and already to be active participant going forward.  Behavioral Observation: Steven [name]  presents as a 64 y.o.-year-old Right Caucasian Male who appeared his stated age. his dress was Appropriate and he was Well Groomed and his manners were Appropriate to the situation.  his participation was indicative of Appropriate and Attentive behaviors.  There were any physical disabilities noted.  he displayed an appropriate level of cooperation and motivation.     Interactions:    Active Appropriate and Attentive  Attention:   within normal limits and attention span and concentration were age appropriate  Memory:   within normal limits; recent and remote memory intact  Visuo-spatial:  not examined  Speech (Volume):  normal  Speech:   normal; normal  Thought Process:  Coherent and Relevant  Though Content:  WNL; not suicidal and not homicidal  Orientation:   person, place, time/date and situation  Judgment:   Fair  Planning:   Fair  Affect:    Appropriate  Mood:    [name] mood initially dysphoric but brightened quickly.  Patient was open about recent frustration and feelings of helplessness and hopelessness prior to his recent surgical intervention and that he had not fully engaged in self-care activities and has been isolating himself.  Insight:   Good  Intelligence:   normal  Medical History:   Past Medical History:  Diagnosis Date  . Cataract    removed by surgery  . Diabetes ([place])    type 2  . 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 Corliss Parish.  . GERD (gastroesophageal reflux disease)   . HLD (hyperlipidemia)   . Hyperparathyroidism ([place])   . Hypertension    Patient denies this DX, no meds  . Kidney transplant  recipient   . Obesity   . Sleep apnea    uses CPAP nightly  . Venous insufficiency   . Vitamin D deficiency     Psychiatric History:  On no formal prior psychiatric history he did admit that he had had issues with depressive type symptomatologies, feeling helpless and hopeless and lack of motivation for self-care.  Family Med/Psych History:  Family History  Problem Relation Age of Onset  . Healthy Mother   . Healthy Father   . Colon cancer Neg Hx   . Rectal cancer Neg Hx   . Stomach cancer Neg Hx    Impression/DX:  Steven [name] is a 64 year old male with history of type 2 diabetes, OSA, hypertension, renal transplant 2006.  Patient was admitted on 06/01/2020 with worsening left foot ulcer.  MRI of left foot revealed osteomyelitis with abscess and patient underwent left BKA on 06/03/2020.  On 06/05/2019 when he developed hypoxia and dyspnea as well as chest pressure due to pulmonary edema.  He was treated with IV diuretics.  Blood sugars have been poorly controlled in the past and difficult to control presently.  Acute on chronic renal failure improving.  Patient has had healing well from his surgical interventions and wound VAC has  been discontinued.  Patient has been limited prior due to left elbow pain and weakness as well as shortness of breath with activity.  CIR was recommended due to functional decline.  Upon entering the room, the patient was sitting in a recliner chair in the upright position.  He was alert and oriented and euthymic in presentation.  Patient described improving of mood with greater functional understanding and completion of transfer tasks.  Patient admitted to prior issues with depression and that he has essentially stopped taking care of himself like he needed to be doing and stopped engaging outside of his home.  He reported it was a combination of Covid restrictions and/or his medical status leading to this significant withdrawal from social activities.  Patient  reports that his mood has been better recently and is motivated to actively work on therapeutic interventions going forward.  He acknowledged hesitancy in some ways with regard to self-care prior and associated with depressive symptomatology.  Patient denies any current depression type symptoms and is feeling more positive about his outcome and already to be active participant going forward.   Disposition/Plan:  Today we worked on coping and adjustment issues.  The patient denied any current depressive symptomatology but acknowledged prior issues.  Patient was instructed on informing staff if depression or anxiety were to become exacerbated.  At this point, I do not think that the patient needs any acute psychopharmacological interventions and does not appear to be clinically depressed.  However, I have encouraged the patient to follow-up post discharge if depressive symptoms redevelop.  Instructed on resources for both psychiatry and therapy going forward.  Diagnosis:    BKA with recent past depressive symptoms.         Electronically Signed   _______________________ Ilean Skill, Psy.D.

## 2020-06-17 NOTE — Progress Notes (Signed)
Physical Therapy Session Note  Patient Details  Name: Steven [name] MRN: [id number] Date of Birth: 04-03-1956  Today's Date: 06/17/2020 PT Individual Time: 4235-3614 PT Individual Time Calculation (min): 55 min   Short Term Goals: Week 1:  PT Short Term Goal 1 (Week 1): Pt will perform bed mobility with minA PT Short Term Goal 2 (Week 1): Pt will perform bed to chair transfer with minA. PT Short Term Goal 3 (Week 1): Pt will ambulate x25' with minA.  Skilled Therapeutic Interventions/Progress Updates:    Pt greeted seated in w/c, agreeable to PT session without reports of pain but does endorse general upper extremity muscle soreness from busy day of therapies. Educated on delayed onset muscle soreness and expectations when participating in strengthening with therapy. Pt propelled himself 125ft with supervision on level surfaces in w/c. Positioned in w/c where he performed gait training. He hopped the length of // bars (67ft) with CGA, forward and backwards, with seated rest breaks b/w efforts. He also hopped laterally along the length of // bars (57ft) with minA for balance. He completed NMR on blue airex foam pad, performing static standing on RLE on pad with minA guard with efforts of standing unsupported. He was able to stand for ~3-5 second bouts on foam pad without support. After seated rest, he performed 2x10 mini-squats on blue airex pad inside // bars with CGA for safety. Focused remainder of session on w/c propulsion. He propelled himself up/down 60ft ramp twice, requiring minA for navigating threshold, otherwise completed with supervision. Returned to his room where he remained seated in w/c with needs in reach, chair alarm on.   Therapy Documentation Precautions:  Precautions Precautions: Fall Precaution Comments: L BKA wound back; watch SpO2/HR Required Braces or Orthoses: Other Brace Other Brace: limb protector Restrictions Weight Bearing Restrictions: Yes LLE Weight  Bearing: Non weight bearing  Therapy/Group: Individual Therapy  Allayna Erlich P Brittlyn Cloe PT 06/17/2020, 7:40 AM

## 2020-06-17 NOTE — Progress Notes (Signed)
[facility] PHYSICAL MEDICINE & REHABILITATION PROGRESS NOTE   Subjective/Complaints: No complaints this morning Pain has been well controlled. Has been taking pain medication for soreness but did not want oxycodone- I have d/ced oxycodone and ordered tylenol with codeine for him to use instead  ROS:   Pt denies SOB, abd pain, CP, N/V/C/D, and vision changes  Objective:   No results found. Recent Labs    06/15/20 0815  WBC 3.7*  HGB 8.7*  HCT 28.3*  PLT 157   Recent Labs    06/15/20 0815  NA 138  K 4.5  CL 106  CO2 23  GLUCOSE 120*  BUN 38*  CREATININE 1.96*  CALCIUM 8.6*    Intake/Output Summary (Last 24 hours) at 06/17/2020 0931 Last data filed at 06/17/2020 0535 Gross per 24 hour  Intake 480 ml  Output 600 ml  Net -120 ml     Pressure Injury 06/08/20 Coccyx Stage 1 -  Intact skin with non-blanchable redness of a localized area usually over a bony prominence. (Active)  06/08/20 1500  Location: Coccyx  Location Orientation:   Staging: Stage 1 -  Intact skin with non-blanchable redness of a localized area usually over a bony prominence.  Wound Description (Comments):   Present on Admission: Yes (present upon transfer)    Physical Exam: Vital Signs Blood pressure 130/68, pulse 70, temperature 98.4 F (36.9 C), temperature source Oral, resp. rate 18, height 5\' 11"  (1.803 m), weight 107.8 kg, SpO2 99 %. General: Alert and oriented x 3, No apparent distress HEENT: Head is normocephalic, atraumatic, PERRLA, EOMI, sclera anicteric, oral mucosa pink and moist, dentition intact, ext ear canals clear,  Neck: Supple without JVD or lymphadenopathy Heart: Reg rate and rhythm. No murmurs rubs or gallops Chest: CTA bilaterally without wheezes, rales, or rhonchi; no distress Abdomen: Soft, non-tender, non-distended, bowel sounds positive. Extremities: No clubbing, cyanosis, or edema. Pulses are 2+ Skin: small stage 1 sacrum. Ertyhema and sanguinous drainage along  lateral aspects of incision. Incision well approximated still. Right heel wound with eschar--unchanged. Vascular changes RLE Neuro: Pt is cognitively appropriate with normal insight, memory, and awareness. Cranial nerves 2-12 are intact. Sensory exam is normal. Reflexes are 2+ in all 4's. Fine motor coordination is intact. No tremors. Motor function is grossly 5/5.  Musculoskeletal: left BK with mild tenderness to palpation. Stump remains edematous. Full knee extension   Assessment/Plan: 1. Functional deficits which require 3+ hours per day of interdisciplinary therapy in a comprehensive inpatient rehab setting.  Physiatrist is providing close team supervision and 24 hour management of active medical problems listed below.  Physiatrist and rehab team continue to assess barriers to discharge/monitor patient progress toward functional and medical goals  Care Tool:  Bathing    Body parts bathed by patient: Right arm, Left arm, Chest, Abdomen, Front perineal area, Buttocks, Right upper leg, Left upper leg, Right lower leg, Face     Body parts n/a: Left lower leg   Bathing assist Assist Level: Minimal Assistance - Patient > 75%     Upper Body Dressing/Undressing Upper body dressing   What is the patient wearing?: Pull over shirt    Upper body assist Assist Level: Set up assist    Lower Body Dressing/Undressing Lower body dressing      What is the patient wearing?: Pants     Lower body assist Assist for lower body dressing: Moderate Assistance - Patient 50 - 74%     Toileting Toileting    Toileting assist  Assist for toileting: Supervision/Verbal cueing Assistive Device Comment: urinal   Transfers Chair/bed transfer  Transfers assist     Chair/bed transfer assist level: Contact Guard/Touching assist     Locomotion Ambulation   Ambulation assist      Assist level: Contact Guard/Touching assist Assistive device: Walker-rolling Max distance: 5 ft   Walk 10 feet  activity   Assist  Walk 10 feet activity did not occur: Safety/medical concerns        Walk 50 feet activity   Assist Walk 50 feet with 2 turns activity did not occur: Safety/medical concerns         Walk 150 feet activity   Assist Walk 150 feet activity did not occur: Safety/medical concerns         Walk 10 feet on uneven surface  activity   Assist Walk 10 feet on uneven surfaces activity did not occur: Safety/medical concerns         Wheelchair     Assist Will patient use wheelchair at discharge?: Yes Type of Wheelchair: Manual Wheelchair activity did not occur: Safety/medical concerns  Wheelchair assist level: Supervision/Verbal cueing Max wheelchair distance: 150'    Wheelchair 50 feet with 2 turns activity    Assist    Wheelchair 50 feet with 2 turns activity did not occur: Safety/medical concerns   Assist Level: Supervision/Verbal cueing   Wheelchair 150 feet activity     Assist  Wheelchair 150 feet activity did not occur: Safety/medical concerns   Assist Level: Supervision/Verbal cueing   Blood pressure 130/68, pulse 70, temperature 98.4 F (36.9 C), temperature source Oral, resp. rate 18, height 5\' 11"  (1.803 m), weight 107.8 kg, SpO2 99 %. Medical Problem List and Plan: 1. Deficits with mobility, transfers, endurance, self-care secondary to left BKA secondary to osteomyelitis.             -patient may shower with stump covered             -ELOS/Goals: 10-15 days/supervision/mod I             --Continue CIR therapies including PT, OT  2.  Antithrombotics: -DVT/anticoagulation:  Pharmaceutical: Lovenox             -antiplatelet therapy: N/A 3. Pain Management:             Monitor postoperative pain with increased exertion             really no phantom limb pain at present  11/17: d/ced oxycodone and ordered tylenol w/ codeine instead for "soreness"- denies residual limb pain  4. Mood: Team support             -antipsychotic  agents: N/A 5. Neuropsych: This patient is capable of making decisions on his own behalf. 6. Skin/Wound Care:  -right heel ulcer: foam dressing, PRAFO, ortho has seen also  -left BKA incision: telfa, kerlix, ACE, stump shrinker, incision examined and with slight bright red blood- no purulence.   -sacrum pressure relief 7. Fluids/Electrolytes/Nutrition: Monitor intake/output.  Heart healthy diet.             encourage appropriate PO  Electrolytes are stable 11/15 8.  T2DM: Monitor BS ac/hs. Continue glyburide 1 mg bid with Levemir 10 units qhs and 15 in am. Novolog 6 TID            10/12 reduce levemir to 10u qam and 5u qpm   Increase novolog back to 6U- better control with this adjustment.    -continue glyburide 5mg   bid   Continue Glyburide to 10mg  BID.  9. Acute on chronic anemia:              Hb 10-->8.5 on 11/9--> 8.1 today---labs ordered for Monday 11/15 10. Thrombocytopenia             platelets 131k 11/11 11. Renal transplant/CKD IIIb:             Followed by Dr. [name].              BUN/Cr 37/1.95, Cr is 1.96 on 11/15  -Aspirin discontinued as patient says he was told by nephrologist not to take this 12. Aortic stenosis/fluid overload: Continue Lasix. Monitor weights and for signs of overload.   Danley Danker Weights   06/15/20 0331 06/16/20 0513 06/17/20 0521  Weight: 103.5 kg 107.8 kg 107.8 kg    13. Left elbow tendinitis/bursitis: Ice, compression, pressure relief. 14. Constipation  11/15: had BM with sorbitol on 11/14        LOS: 7 days A FACE TO [place] Steven [name] 06/17/2020, 9:31 AM

## 2020-06-17 NOTE — Progress Notes (Signed)
Hypoglycemic Event  CBG: 57  Treatment: 4 oz juice/soda  Symptoms: None  Follow-up CBG: Time:1755 CBG Result:103  Possible Reasons for Event: Unknown  Comments/MD notified:N/A    Steven [name]  [name]

## 2020-06-17 NOTE — Progress Notes (Signed)
Physical Therapy Session Note  Patient Details  Name: Steven [name] MRN: [id number] Date of Birth: 1956-05-16  Today's Date: 06/17/2020 PT Individual Time: 0850-1010 PT Individual Time Calculation (min): 80 min   Short Term Goals: Week 1:  PT Short Term Goal 1 (Week 1): Pt will perform bed mobility with minA PT Short Term Goal 2 (Week 1): Pt will perform bed to chair transfer with minA. PT Short Term Goal 3 (Week 1): Pt will ambulate x25' with minA.  Skilled Therapeutic Interventions/Progress Updates: Pt presented in bed agreeable to therapy. Pt denies pain at rest, took occasion discomfort with activity with rest breaks. Performed bed mobility with supervision and use of bed features. Pt donned shoe with CGA and PTA tied laces for time management. Pt then performed squat pivot transfer to w/c with minA with PTA stabilizing w/c due to shifting during transfer. Pt then propelled to rehab gym with supervision. Pt was able to perform w/c parts management and performed squat pivot transfer to mat with CGA and pt verbalizing sequencing prior to transfer. Pt then performed STS x 5 with RW from lower surface and CGA. Pt with x 1 failed attempt at standing during trials with noted heavy R lateral lean causing failed attempt. Pt then participated in standing tolerance activities performing additional STS while reaching and placing horseshoes on basketball net. Pt was able to perform x 3 bouts with brief seated rests between. Pt was able to reach forward and crossing midline L/R with CGA and pt taking time to reposition self if needed. Pt then asked questions regarding residual limb and prosthetic fitting with PTA providing education . Pt then performed squat pivot to L into w/c with CGA nearing close S. Pt then propelled back to room distance supervision and agreeable to remain in w/c. Discussed with pt setting up family ed due to pt's desire to d/c early and PTA contacting 84, LSW. Pt left in w/c at  end of session with seat alarm on, call bell within reach and needs met.      Therapy Documentation Precautions:  Precautions Precautions: Fall Precaution Comments: L BKA wound back; watch SpO2/HR Required Braces or Orthoses: Other Brace Other Brace: limb protector Restrictions Weight Bearing Restrictions: Yes LLE Weight Bearing: Non weight bearing General:   Vital Signs:   Pain: Pain Assessment Pain Score: 0-No pain Mobility:   Locomotion :    Trunk/Postural Assessment :    Balance:   Exercises:   Other Treatments:      Therapy/Group: Individual Therapy  Steven [name] 06/17/2020, 3:51 PM

## 2020-06-17 NOTE — Progress Notes (Signed)
Patient ID: Kamonte Mcmichen, male   DOB: 03/13/1956, 64 y.o.   MRN: [id number]  Per therapy, they would like to schedule family education. SW to follow-up with pt and family.   SW called [name] [phone]) to provide updates from team conference, and d/c date being 11/24. Reports pt will not have 24/7 care and only intermittent support. SW indicated will speak with team to discuss if intermittent support is attainable for pt versus supervision. She intends to follow-up with SW tomorrow to discuss family education. SW met with pt in room to provide updates from team conference, d/c date, and conversation with [place]. Pt reports he will have support from his mother who can provide supervision level of care, and physical support from his dad. States he has neighbors/friends/cummonity support on people who can help him. SW encouraged him to speak with [name] and his parents on best time for family education Friday and this weekend (Sat or Sun).   Loralee Pacas, MSW, [place] Office: [phone] Cell: [phone] Fax: [phone]

## 2020-06-17 NOTE — Progress Notes (Signed)
Occupational Therapy Session Note  Patient Details  Name: Steven [name] MRN: [id number] Date of Birth: [birth year]  Today's Date: 06/17/2020 OT Individual Time: 1030-1145 OT Individual Time Calculation (min): 75 min    Short Term Goals: Week 1:  OT Short Term Goal 1 (Week 1): Pt will complete squat pivot transfer w/c<>drop arm commode with CGA OT Short Term Goal 2 (Week 1): Pt will complete LB dressing with CGA OT Short Term Goal 3 (Week 1): Pt will complete UB/LB bathing with CGA OT Short Term Goal 4 (Week 1): Pt will achieve 0 degrees left elbow passive and active extension.  Skilled Therapeutic Interventions/Progress Updates:    Pt received in wc dressed and ready for the day.  He declined undressing to bathe at the sink and said he would do that tomorrow.   Pt requested to focus on strength and endurance as he felt that would be most helpful to him. Pt self propelled wc to therapy gym.  Worked on a circuit of exercises for 4 rounds: 3# dowel chest press 15x 3# dowel overhead tricep ext 15x 2 # med ball sh presses 15x tricep dips in wc 10x (fatigued heavily on last rep) R knee AROM with isometric holds 10x  Next circuit was with Level 2 theraband to focus on his upperback strength with 3 rounds of 15 reps of lat pull downs and horizontal rows.  Pt self propelled back to his room and requested to stay in wc.  Chair alarm on and all needs met.    Therapy Documentation Precautions:  Precautions Precautions: Fall Precaution Comments: L BKA wound back; watch SpO2/HR Required Braces or Orthoses: Other Brace Other Brace: limb protector Restrictions Weight Bearing Restrictions: Yes LLE Weight Bearing: Non weight bearing   Pain: Pain Assessment Pain Scale: 0-10 Pain Score: 0-No pain ADL: ADL Grooming: Setup Where Assessed-Grooming: Sitting at sink Upper Body Bathing: Setup Where Assessed-Upper Body Bathing: Sitting at sink Lower Body Bathing: Minimal  assistance Where Assessed-Lower Body Bathing: Sitting at sink, Standing at sink Upper Body Dressing: Setup Where Assessed-Upper Body Dressing: Sitting at sink Where Assessed-Lower Body Dressing: Sitting at sink, Standing at sink Toilet Transfer Method: Squat pivot Toilet Transfer Equipment: Drop arm bedside commode   Therapy/Group: Individual Therapy  [place]man 06/17/2020, 12:10 PM

## 2020-06-18 ENCOUNTER — Inpatient Hospital Stay (HOSPITAL_COMMUNITY): Payer: BC Managed Care – PPO | Admitting: Occupational Therapy

## 2020-06-18 ENCOUNTER — Inpatient Hospital Stay (HOSPITAL_COMMUNITY): Payer: BC Managed Care – PPO

## 2020-06-18 LAB — GLUCOSE, CAPILLARY
Glucose-Capillary: 67 mg/dL — ABNORMAL LOW (ref 70–99)
Glucose-Capillary: 85 mg/dL (ref 70–99)
Glucose-Capillary: 261 mg/dL — ABNORMAL HIGH (ref 70–99)
Glucose-Capillary: 56 mg/dL — ABNORMAL LOW (ref 70–99)
Glucose-Capillary: 75 mg/dL (ref 70–99)
Glucose-Capillary: 84 mg/dL (ref 70–99)
Glucose-Capillary: 182 mg/dL — ABNORMAL HIGH (ref 70–99)

## 2020-06-18 MED ORDER — INSULIN ASPART 100 UNIT/ML ~~LOC~~ SOLN
3.0000 [IU] | Freq: Two times a day (BID) | SUBCUTANEOUS | Status: DC
  Administered 2020-06-19 – 2020-06-23 (×10): 3 [IU] via SUBCUTANEOUS

## 2020-06-18 MED ORDER — INSULIN ASPART 100 UNIT/ML ~~LOC~~ SOLN
6.0000 [IU] | Freq: Every day | SUBCUTANEOUS | Status: DC
  Administered 2020-06-19 – 2020-06-23 (×4): 6 [IU] via SUBCUTANEOUS

## 2020-06-18 MED ORDER — INSULIN ASPART 100 UNIT/ML ~~LOC~~ SOLN: 6.0000 [IU] | Freq: Two times a day (BID) | SUBCUTANEOUS | Status: DC

## 2020-06-18 MED ORDER — INSULIN ASPART 100 UNIT/ML ~~LOC~~ SOLN: 3.0000 [IU] | Freq: Every day | SUBCUTANEOUS | Status: DC

## 2020-06-18 MED ORDER — GLUCOSE 40 % PO GEL: 1.0000 | ORAL | Status: AC

## 2020-06-18 MED ORDER — TRAMADOL HCL 50 MG PO TABS: 50.0000 mg | ORAL_TABLET | Freq: Four times a day (QID) | ORAL | Status: DC | PRN

## 2020-06-18 NOTE — Progress Notes (Signed)
BG recheck: 84 after another 4 oz of juice for a total of 8 oz. Pt doing well.

## 2020-06-18 NOTE — Progress Notes (Signed)
Occupational Therapy Weekly Progress Note  Patient Details  Name: Steven [name] MRN: [id number] Date of Birth: Sep 26, 1955  Beginning of progress report period: June 11, 2020 End of progress report period: June 18, 2020  Today's Date: 06/18/2020 OT Individual Time: 2979-8921 OT Individual Time Calculation (min): 70 min     Patient has met 4 of 4 short term goals.  Pt is making steady progress towards OT goals at this time. Pt is at an overall CGA level for functional transfers and sit<>stands within BADL tasks. Pt has also demonstrated improved activity tolerance and understanding of modifications for increased independence after L BKA. Continue current POC.  Patient continues to demonstrate the following deficits: muscle weakness and muscle joint tightness, decreased cardiorespiratoy endurance and decreased sitting balance, decreased standing balance and difficulty maintaining precautions and therefore will continue to benefit from skilled OT intervention to enhance overall performance with BADL and Reduce care partner burden.  Patient progressing toward long term goals..  Continue plan of care.  OT Short Term Goals Week 1:  OT Short Term Goal 1 (Week 1): Pt will complete squat pivot transfer w/c<>drop arm commode with CGA OT Short Term Goal 1 - Progress (Week 1): Met OT Short Term Goal 2 (Week 1): Pt will complete LB dressing with CGA OT Short Term Goal 2 - Progress (Week 1): Met OT Short Term Goal 3 (Week 1): Pt will complete UB/LB bathing with CGA OT Short Term Goal 3 - Progress (Week 1): Met OT Short Term Goal 4 (Week 1): Pt will achieve 0 degrees left elbow passive and active extension. OT Short Term Goal 4 - Progress (Week 1): Met Week 2:  OT Short Term Goal 1 (Week 2): LTG=STG 2/2 ELOS  Skilled Therapeutic Interventions/Progress Updates:     Pt greeted seated in wc and agreeable to OT treatment session focused on community re-integration and there-ex. Pt propelled  wc to elevators with OT providing education on community safety awareness from wc level, elevator access, and community barriers. Practiced propelling wc on uneven surfaces. UB there-ex using level 3 green theraband-3 sets of 10 bicep curl, triceps press, straight arm raises, lat pulls, and shoulder pull downs. L residual limb there-ex of knee extension and glute squeezes. Pt returned to room and left seated in wc with needs met.   Therapy Documentation Precautions:  Precautions Precautions: Fall Precaution Comments: L BKA wound back; watch SpO2/HR Required Braces or Orthoses: Other Brace Other Brace: limb protector Restrictions Weight Bearing Restrictions: Yes LLE Weight Bearing: Non weight bearing Pain:  denies pain  Therapy/Group: Individual Therapy  Valma Cava 06/18/2020, 10:34 AM

## 2020-06-18 NOTE — Progress Notes (Signed)
[place] [facility] PHYSICAL MEDICINE & REHABILITATION PROGRESS NOTE   Subjective/Complaints: No complaints this morning Pain has been well controlled. Continues to have soreness after therapy and tylenol with codeine was effective for him yesterday.   ROS:   Pt denies SOB, abd pain, CP, N/V/C/D, and vision changes  Objective:   No results found. No results for input(s): WBC, HGB, HCT, PLT in the last 72 hours. No results for input(s): NA, K, CL, CO2, GLUCOSE, BUN, CREATININE, CALCIUM in the last 72 hours.  Intake/Output Summary (Last 24 hours) at 06/18/2020 1956 Last data filed at 06/18/2020 1320 Gross per 24 hour  Intake 355 ml  Output --  Net 355 ml     Pressure Injury 06/08/20 Coccyx Stage 1 -  Intact skin with non-blanchable redness of a localized area usually over a bony prominence. (Active)  06/08/20 1500  Location: Coccyx  Location Orientation:   Staging: Stage 1 -  Intact skin with non-blanchable redness of a localized area usually over a bony prominence.  Wound Description (Comments):   Present on Admission: Yes (present upon transfer)    Physical Exam: Vital Signs Blood pressure 132/68, pulse 71, temperature 98.2 F (36.8 C), temperature source Oral, resp. rate 18, height 5\' 11"  (1.803 m), weight 109 kg, SpO2 100 %.  General: Alert and oriented x 3, No apparent distress HEENT: Head is normocephalic, atraumatic, PERRLA, EOMI, sclera anicteric, oral mucosa pink and moist, dentition intact, ext ear canals clear,  Neck: Supple without JVD or lymphadenopathy Heart: Reg rate and rhythm. No murmurs rubs or gallops Chest: CTA bilaterally without wheezes, rales, or rhonchi; no distress Abdomen: Soft, non-tender, non-distended, bowel sounds positive. Extremities: No clubbing, cyanosis, or edema. Pulses are 2+ Skin: small stage 1 sacrum. Ertyhema and sanguinous drainage along lateral aspects of incision. Incision well approximated still. Right heel wound with eschar--unchanged.  Vascular changes RLE Neuro: Pt is cognitively appropriate with normal insight, memory, and awareness. Cranial nerves 2-12 are intact. Sensory exam is normal. Reflexes are 2+ in all 4's. Fine motor coordination is intact. No tremors. Motor function is grossly 5/5.  Musculoskeletal: left BK with mild tenderness to palpation. Stump remains edematous. Full knee extension   Assessment/Plan: 1. Functional deficits which require 3+ hours per day of interdisciplinary therapy in a comprehensive inpatient rehab setting.  Physiatrist is providing close team supervision and 24 hour management of active medical problems listed below.  Physiatrist and rehab team continue to assess barriers to discharge/monitor patient progress toward functional and medical goals  Care Tool:  Bathing    Body parts bathed by patient: Right arm, Left arm, Chest, Abdomen, Front perineal area, Buttocks, Right upper leg, Left upper leg, Right lower leg, Face     Body parts n/a: Left lower leg   Bathing assist Assist Level: Minimal Assistance - Patient > 75%     Upper Body Dressing/Undressing Upper body dressing   What is the patient wearing?: Pull over shirt    Upper body assist Assist Level: Set up assist    Lower Body Dressing/Undressing Lower body dressing      What is the patient wearing?: Pants     Lower body assist Assist for lower body dressing: Moderate Assistance - Patient 50 - 74%     Toileting Toileting    Toileting assist Assist for toileting: Supervision/Verbal cueing Assistive Device Comment: urinal   Transfers Chair/bed transfer  Transfers assist     Chair/bed transfer assist level: Contact Guard/Touching assist     Locomotion Ambulation  Ambulation assist      Assist level: Contact Guard/Touching assist Assistive device: Walker-rolling Max distance: 5 ft   Walk 10 feet activity   Assist  Walk 10 feet activity did not occur: Safety/medical concerns        Walk 50  feet activity   Assist Walk 50 feet with 2 turns activity did not occur: Safety/medical concerns         Walk 150 feet activity   Assist Walk 150 feet activity did not occur: Safety/medical concerns         Walk 10 feet on uneven surface  activity   Assist Walk 10 feet on uneven surfaces activity did not occur: Safety/medical concerns         Wheelchair     Assist Will patient use wheelchair at discharge?: Yes Type of Wheelchair: Manual Wheelchair activity did not occur: Safety/medical concerns  Wheelchair assist level: Supervision/Verbal cueing Max wheelchair distance: 150'    Wheelchair 50 feet with 2 turns activity    Assist    Wheelchair 50 feet with 2 turns activity did not occur: Safety/medical concerns   Assist Level: Supervision/Verbal cueing   Wheelchair 150 feet activity     Assist  Wheelchair 150 feet activity did not occur: Safety/medical concerns   Assist Level: Supervision/Verbal cueing   Blood pressure 132/68, pulse 71, temperature 98.2 F (36.8 C), temperature source Oral, resp. rate 18, height 5\' 11"  (1.803 m), weight 109 kg, SpO2 100 %. Medical Problem List and Plan: 1. Deficits with mobility, transfers, endurance, self-care secondary to left BKA secondary to osteomyelitis.             -patient may shower with stump covered             -ELOS/Goals: 10-15 days/supervision/mod I             --Continue CIR therapies including PT, OT  2.  Antithrombotics: -DVT/anticoagulation:  Pharmaceutical: Lovenox             -antiplatelet therapy: N/A 3. Pain Management:             Monitor postoperative pain with increased exertion             really no phantom limb pain at present  11/17: d/ced oxycodone and ordered tylenol w/ codeine instead for "soreness"- denies residual limb pain. 11/18: this change was effective for him.  4. Mood: Team support             -antipsychotic agents: N/A 5. Neuropsych: This patient is capable of making  decisions on his own behalf. 6. Skin/Wound Care:  -right heel ulcer: foam dressing, PRAFO, ortho has seen also  -left BKA incision: telfa, kerlix, ACE, stump shrinker, incision examined and with slight bright red blood- no purulence.   -sacrum pressure relief 7. Fluids/Electrolytes/Nutrition: Monitor intake/output.  Heart healthy diet.             encourage appropriate PO  Electrolytes are stable 11/15 8.  T2DM: Monitor BS ac/hs. Continue Glyburide to 10mg  BID.   11/18: hypoglycemic to 50s: decrease lunch and supper coverage to 3U and keep breakfast coverage at 6U. D/c HS detemir 9. Acute on chronic anemia:              Hb 10-->8.5 on 11/9--> 8.1 today---labs ordered for Monday 11/15 10. Thrombocytopenia             platelets 131k 11/11 11. Renal transplant/CKD IIIb:  Followed by Dr. [name].              BUN/Cr 37/1.95, Cr is 1.96 on 11/15  -Aspirin restarted as cleared by nephrology 12. Aortic stenosis/fluid overload: Continue Lasix. Monitor weights and for signs of overload.   Danley Danker Weights   06/16/20 0513 06/17/20 0521 06/18/20 0508  Weight: 107.8 kg 107.8 kg 109 kg    13. Left elbow tendinitis/bursitis: Ice, compression, pressure relief. 14. Constipation  11/15: had BM with sorbitol on 11/14        LOS: 8 days A FACE TO [place] Jameya Pontiff 06/18/2020, 7:56 PM

## 2020-06-18 NOTE — Progress Notes (Signed)
Physical Therapy Session Note  Patient Details  Name: Steven [name] MRN: [id number] Date of Birth: 08/30/1955  Today's Date: 06/18/2020 PT Individual Time: 1100-1200 PT Individual Time Calculation (min): 60 min   Short Term Goals: Week 1:  PT Short Term Goal 1 (Week 1): Pt will perform bed mobility with minA PT Short Term Goal 2 (Week 1): Pt will perform bed to chair transfer with minA. PT Short Term Goal 3 (Week 1): Pt will ambulate x25' with minA.  Skilled Therapeutic Interventions/Progress Updates:    Pt greeted seated in w/c, agreeable to PT session, no reports of pain. Pt propelled himself ~161ft with supervision on level surfaces in w/c. Performed block practice squat<>pivot transfers from w/c to mat table, progressing from CGA to close supervision. Pt reporting feeling somewhat drowsy (in no apparent distress), and requests to have blood glucose tested. NT notified who arrived to assess blood sugars, reading >200. Resumed session while monitoring symptoms. He performed standing balance tasks with dual-cog overlay on BITS system, performing memory with words & numbers and focusing on reaching outside cone of stability. Pt with increased difficulty reaching L and requires minA for balance while completing this. He was able to recall 9-number/letter sequencing without much difficulty. Pt requesting to practice navigated ~3ft ramp in w/c, he again required minA for navigating threshold but otherwise was able to complete with supervision. Transported to ADL apartment room to practice furniture transfers. Performed stand<>pivot transfer with minA and RW from w/c to sofa couch. Unable to perform stand<>pivot without +2 modA to rise from couch due to low surface height and soft cushion. Pt propelled himself back to his room, ~147ft, with supervision. Reports moderate fatgiue after completion of session but agreeable to remain seated in w/c at end of session, needs in reach.  Therapy  Documentation Precautions:  Precautions Precautions: Fall Precaution Comments: L BKA wound back; watch SpO2/HR Required Braces or Orthoses: Other Brace Other Brace: limb protector Restrictions Weight Bearing Restrictions: Yes LLE Weight Bearing: Non weight bearing  Therapy/Group: Individual Therapy  Mika Griffitts P Talise Sligh PT 06/18/2020, 7:37 AM

## 2020-06-18 NOTE — Progress Notes (Signed)
Hypoglycemic Event  CBG: 67  Treatment: 1 tube glucose gel refused.  4oz juice given per patient request  Symptoms: None  Follow-up CBG: [id number] CBG    Result:85  Possible Reasons for Event: Inadequate meal intake  Comments/MD notified: Followed standing orders per protocol.    Burke Keels

## 2020-06-18 NOTE — Progress Notes (Signed)
P: Constipation  I: PRN stool softener per order  E:

## 2020-06-18 NOTE — Progress Notes (Signed)
Patient with drop in BS to 50's before supper. Blood sugars have been low in the past 24 hours. Will decrease lunch and supper time meal coverage to 3 units/Keep breakfast at 6 units.

## 2020-06-18 NOTE — Progress Notes (Signed)
Occupational Therapy Session Note  Patient Details  Name: Steven [name] MRN: [id number] Date of Birth: 1955-11-08  Today's Date: 06/18/2020  OT Individual Time: 0601-5615 OT Individual Time Calculation (min): 74 min   Short Term Goals: Week 1:  OT Short Term Goal 1 (Week 1): Pt will complete squat pivot transfer w/c<>drop arm commode with CGA OT Short Term Goal 2 (Week 1): Pt will complete LB dressing with CGA OT Short Term Goal 3 (Week 1): Pt will complete UB/LB bathing with CGA OT Short Term Goal 4 (Week 1): Pt will achieve 0 degrees left elbow passive and active extension.  Skilled Therapeutic Interventions/Progress Updates:    Pt greeted semi-reclined in bed and agreeable to OT treatment session. Pt requesting to shower today. Pt completed bed mobility with head of bed elevated and supervision. Pt needed OT assist to don R sock, then he completed squat-pivot to R side with CGA. Discussed home shower set-up and simulated shower transfer for home. Pt used grab bars and completed squat-pivot w/ CGA. Lateral leans to doff pants. Pt then donned L waterproof bag over residual limb. Bathing completed at overall supervision level with lateral leans used to wash buttocks. Dressing completed from wc with focus on modifications for LB self-care using reacher, and propping R LE on bed to improve reach. Sit<>stands at the sink with CGA while pulling up LE garmets. Pt left seated in wc at end of session with call bell in reach and needs met.   Therapy Documentation Precautions:  Precautions Precautions: Fall Precaution Comments: L BKA wound back; watch SpO2/HR Required Braces or Orthoses: Other Brace Other Brace: limb protector Restrictions Weight Bearing Restrictions: Yes LLE Weight Bearing: Non weight bearing Pain:  denies pain   Therapy/Group: Individual Therapy  Valma Cava 06/18/2020, 9:06 AM

## 2020-06-18 NOTE — Progress Notes (Signed)
Patient ID: Steven [name], male   DOB: 1956/03/14, 64 y.o.   MRN: [id number]  SW received phone call from [place] who reported family edu on Saturday (11/20) 10am-12pm with Shary Key, and 1pm-3pm with Wilma/Maggie (parents).   Loralee Pacas, MSW, [place] Office: [phone] Cell: [phone] Fax: [phone]

## 2020-06-18 NOTE — Progress Notes (Signed)
BG increasing. Last check after 4 oz of juice: 75. Pt remains asymptomatic. Will recheck until above 80

## 2020-06-18 NOTE — Progress Notes (Addendum)
   06/18/20 1700  Provider Notification  Provider Name/Title Jeannene Patella, PA  Date Provider Notified 06/18/20  Time Provider Notified [phone]  Notification Type Face-to-face  Notification Reason Other (Comment) (BG 56)  Response See new orders  Date of Provider Response 06/18/20  Time of Provider Response 1703    Pt remains asymptomatic. Skin is warm and dry. Pt is alert and oriented. Recheck glucose in 15 min

## 2020-06-19 ENCOUNTER — Inpatient Hospital Stay (HOSPITAL_COMMUNITY): Payer: BC Managed Care – PPO

## 2020-06-19 ENCOUNTER — Inpatient Hospital Stay (HOSPITAL_COMMUNITY): Payer: BC Managed Care – PPO | Admitting: Occupational Therapy

## 2020-06-19 LAB — GLUCOSE, CAPILLARY
Glucose-Capillary: 171 mg/dL — ABNORMAL HIGH (ref 70–99)
Glucose-Capillary: 93 mg/dL (ref 70–99)
Glucose-Capillary: 83 mg/dL (ref 70–99)
Glucose-Capillary: 149 mg/dL — ABNORMAL HIGH (ref 70–99)

## 2020-06-19 NOTE — Progress Notes (Addendum)
Occupational Therapy Session Note  Patient Details  Name: Steven [name] MRN: [id number] Date of Birth: 08-Dec-1955  Today's Date: 06/19/2020  Session 1 OT Individual Time: 5248-1859  OT Individual Time Calculation (min): 58 min   Session 2 OT Individual Time: 0931-1216  OT Individual Time Calculation (min): 43 min   Short Term Goals: Week 2:  OT Short Term Goal 1 (Week 2): LTG=STG 2/2 ELOS  Skilled Therapeutic Interventions/Progress Updates:  Session 1   Pt greeted semi-reclined in bed and agreeable to OT treatment session. Pt reported he felt he could have a BM. Pt completed bed mobility w/ supervision. Squat-pivot transfer from bed>wc with CGA. Pt completed stand-pivot to commode with raised seat and arm rests with use of grab bars. Pt was able to manage clothing w/ CGA for balance. Pt with continent void of bowel and bladder. Pt was able to complete his own peri-care. OT provided pt with reacher and education was provided for threading new underwear and pants. Sit<>stand w/ CGA and CGA for balance when pulling up pants. Pt then able to hop and pivot back to wc with CGA. Pt propelled wc to the sink to wash hands, brush teeth, and change shirts from wc level. OT had pt prop L residual limb on the bed and practice donning/doffing shrinker. OT also had pt inspect L residual limb using inspection mirror. Educated on importance of checking skin integrity and healing of limb. OT changed dressing with nonadherent pad and krilex. Pt then was able to re-don his shrinker sock. Pt returned propelled wc in hallway and back to room and was left seated in wc with needs met.    Session 2 Pt greeted seated in wc and agreeable to OT treatment session. Pt propelled wc to therapy gym. OT provided pt with elastic shoe laces and had him string laces to modified R shoe. Pt was then able to don shoe with supervision. Pt brought to therapy apartment and reviewed home bathroom set-up and discussed walk-in shower  transfer. Pt reported tub transfer bench will fit nicely in his shower and he has lots of grab bars as well. Worked on wc mobility by propelling wc up and down wc ramp 3x. Pt then completed 3 sit<>stands and worked on standing balance while reaching 1 UE at a time behind back to simulate clothing management. Pt returned to room and left seated in wc with needs met.   Therapy Documentation Precautions:  Precautions Precautions: Fall Precaution Comments: L BKA wound back; watch SpO2/HR Required Braces or Orthoses: Other Brace Other Brace: limb protector Restrictions Weight Bearing Restrictions: Yes LLE Weight Bearing: Non weight bearing Pain:   denies pain   Therapy/Group: Individual Therapy  Valma Cava 06/19/2020, 3:47 PM

## 2020-06-19 NOTE — Progress Notes (Signed)
Patient ID: Steven [name], male   DOB: 06-17-1956, 64 y.o.   MRN: [id number]  SW ordered pt DME: w/x with left residual limb pad, DABSC, and TTB with [employer] via parachute.   SW called pt in room to inform on above. SW later provided pt with H[facility] list to discuss preference. SW waiting on follow-up.   Loralee Pacas, MSW, [place] Office: [phone] Cell: [phone] Fax: [phone]

## 2020-06-19 NOTE — Progress Notes (Signed)
Physical Therapy Weekly Progress Note  Patient Details  Name: Steven [name] MRN: [id number] Date of Birth: Mar 24, 1956  Beginning of progress report period: June 11, 2020 End of progress report period: June 19, 2020  Today's Date: 06/19/2020 PT Individual Time: 1100-1158  + 1300-1330  PT Individual Time Calculation (min): 58 min  + 30 min  Patient has met 2 of 3 short term goals.  Pt is making appropriate progress towards goals during his rehabilitation. He can perform bed mobility with supervision, squat<>pivot transfers with CGA, stand<>pivot transfers with CGA/minA and RW, and can ambulate ~10-36f with minA and RW. He is supervision level with w/c and has good understanding of safety mechanics and ability to manage leg/arm rests. He continues to show deficits with general deconditioning, upper extremity strength, decreased standing balance, and poor R foot clearance during hopping.  Patient continues to demonstrate the following deficits muscle weakness, decreased cardiorespiratoy endurance and decreased standing balance and decreased balance strategies and therefore will continue to benefit from skilled PT intervention to increase functional independence with mobility.  Patient progressing toward long term goals.  Continue plan of care. Downgraded ambulation goal to minA and 115fwith LRAD.   PT Short Term Goals Week 1:  PT Short Term Goal 1 (Week 1): Pt will perform bed mobility with minA PT Short Term Goal 2 (Week 1): Pt will perform bed to chair transfer with minA. PT Short Term Goal 3 (Week 1): Pt will ambulate x25' with minA. Week 2:  PT Short Term Goal 1 (Week 2): STG = LTG due to ELOS  Skilled Therapeutic Interventions/Progress Updates:     1st session: Pt greeted seated in w/c, agreeable to PT session, no reports of pain. Discussed planning and scheduling family education/training over the weekend. Reviewed purpose of family training with emphasis on transfer  training, car transfers, threshold management, energy conservation, general amputee ed, and home safety training. Pt propelled himself ~1503fith supervision on level surfaces in w/c from his room to main therapy gym. Performed squat<>pivot transfer with CGA from w/c to mat table, demonstrated good understanding of w/c positioning, managing leg/arm rests, and locking brakes for transfers. While seated EOM, discussed general amputee education and techniques for managing phantom pains. Performed repeated sit<>stand transfer from lowered mat table to RW, requiring minA for rising. Also performed hopping over 1/2 inch threshold (used hockey stick) with minA guard and RW support, repeated 4x10 times with seated rest breaks provided. Pt with difficulty clearing RLE over threshold and was successful ~50% of the time. PT developed HEP and this was reviewed with patient, handout provided as well. He performed 1x10 reps of the following exercises with therapist educating on positioning, muscle activation, dosage, and technique. He performed sit<>supine and sidelying<>prone with supervision on mat table.  Access Code: [email or web address]: [email or web address] Date: 06/19/2020 Prepared by: ChrGinnie Smartxercises Supine Quad Set (BKA) - 1 x daily - 7 x weekly - 10 reps - 3 sets Sidelying Hip Abduction (BKA) - 1 x daily - 7 x weekly - 10 reps - 3 sets Prone Hip Extension with Residual Limb (BKA) - 1 x daily - 7 x weekly - 10 reps - 3 sets Sidelying Hip Abduction wtih Flexion and Extension (BKA) - 1 x daily - 7 x weekly - 10 reps - 3 sets  Squat<>pivot transfer with CGA from mat table to w/c and he propelled himself back to his room with supervision. Therapist applied towel roll to end of L leg rest to  promote knee extension. He remained seated in w/c at end of session with needs in reach.   2nd session: Pt received sitting in w/c, watching TV, agreeable to PT session without reports of pain. Appears  in good spirits.  Pt requesting to focus session on upper extremity strengthening. He propelled himself with supervision in w/c ~184f to main therapy gym. Aligned himself up correctly to mat table and performed squat<>pivot transfer with CGA from w/c to mat table. Performed the following seated there-ex: -2x10 bicep curls with 5# dowel rod -2x10 shldr flex with 5# dowel rod -2x10 overhead tricep ext with 5# dowel rod -2x10 D1 extension with orange theraband RUE + LUE -2x10 D1 flexion with orange theraband RUE + LUE -2x10 shldr abduction with orange theraband -2x10 seated push-up with yoga blocks  Squat<>pivot transfer with CGA from mat table to w/c and returned to his room where he remained seated in chair with needs in reach.   Therapy Documentation Precautions:  Precautions Precautions: Fall Precaution Comments: L BKA wound back; watch SpO2/HR Required Braces or Orthoses: Other Brace Other Brace: limb protector Restrictions Weight Bearing Restrictions: Yes LLE Weight Bearing: Non weight bearing  Therapy/Group: Individual Therapy  Tenzin Pavon P Naylani Bradner PT 06/19/2020, 7:26 AM

## 2020-06-19 NOTE — Plan of Care (Signed)
  Problem: RH Ambulation Goal: LTG Patient will ambulate in controlled environment (PT) Description: LTG: Patient will ambulate in a controlled environment, # of feet with assistance (PT). Flowsheets (Taken 06/19/2020 1249) LTG: Pt will ambulate in controlled environ  assist needed:: Minimal Assistance - Patient > 75% LTG: Ambulation distance in controlled environment: 15' Note: Downgraded due to slow progress Goal: LTG Patient will ambulate in home environment (PT) Description: LTG: Patient will ambulate in home environment, # of feet with assistance (PT). Flowsheets (Taken 06/19/2020 1249) LTG: Pt will ambulate in home environ  assist needed:: Minimal Assistance - Patient > 75% LTG: Ambulation distance in home environment: 15' Note: Downgraded due to slow progress

## 2020-06-19 NOTE — Progress Notes (Addendum)
[facility] PHYSICAL MEDICINE & REHABILITATION PROGRESS NOTE   Subjective/Complaints: No complaints this morning.  He requests a handicap placard He also has been assigned [employer] and will not be able to tolerate it.   ROS:  Pt denies SOB, abd pain, CP, N/V/C/D, and vision changes  Objective:   No results found. No results for input(s): WBC, HGB, HCT, PLT in the last 72 hours. No results for input(s): NA, K, CL, CO2, GLUCOSE, BUN, CREATININE, CALCIUM in the last 72 hours.  Intake/Output Summary (Last 24 hours) at 06/19/2020 1010 Last data filed at 06/19/2020 0734 Gross per 24 hour  Intake 637 ml  Output 825 ml  Net -188 ml     Pressure Injury 06/08/20 Coccyx Stage 1 -  Intact skin with non-blanchable redness of a localized area usually over a bony prominence. (Active)  06/08/20 1500  Location: Coccyx  Location Orientation:   Staging: Stage 1 -  Intact skin with non-blanchable redness of a localized area usually over a bony prominence.  Wound Description (Comments):   Present on Admission: Yes (present upon transfer)    Physical Exam: Vital Signs Blood pressure 135/63, pulse 72, temperature 98.3 F (36.8 C), temperature source Oral, resp. rate 18, height 5\' 11"  (1.803 m), weight 110.7 kg, SpO2 98 %. General: Alert and oriented x 3, No apparent distress HEENT: Head is normocephalic, atraumatic, PERRLA, EOMI, sclera anicteric, oral mucosa pink and moist, dentition intact, ext ear canals clear,  Neck: Supple without JVD or lymphadenopathy Heart: Reg rate and rhythm. No murmurs rubs or gallops Chest: CTA bilaterally without wheezes, rales, or rhonchi; no distress Abdomen: Soft, non-tender, non-distended, bowel sounds positive. Extremities: No clubbing, cyanosis, or edema. Pulses are 2+ Skin: Sacral pressure injury stage 1. Right heel wound with eschar--unchanged. Vascular changes RLE Neuro: Pt is cognitively appropriate with normal insight, memory, and awareness. Cranial  nerves 2-12 are intact. Sensory exam is normal. Reflexes are 2+ in all 4's. Fine motor coordination is intact. No tremors. Motor function is grossly 5/5.  Musculoskeletal: left BK with mild tenderness to palpation. Stump remains edematous. Full knee extension   Assessment/Plan: 1. Functional deficits which require 3+ hours per day of interdisciplinary therapy in a comprehensive inpatient rehab setting.  Physiatrist is providing close team supervision and 24 hour management of active medical problems listed below.  Physiatrist and rehab team continue to assess barriers to discharge/monitor patient progress toward functional and medical goals  Care Tool:  Bathing    Body parts bathed by patient: Right arm, Left arm, Chest, Abdomen, Front perineal area, Buttocks, Right upper leg, Left upper leg, Right lower leg, Face     Body parts n/a: Left lower leg   Bathing assist Assist Level: Supervision/Verbal cueing     Upper Body Dressing/Undressing Upper body dressing   What is the patient wearing?: Pull over shirt    Upper body assist Assist Level: Supervision/Verbal cueing    Lower Body Dressing/Undressing Lower body dressing      What is the patient wearing?: Underwear/pull up, Pants     Lower body assist Assist for lower body dressing: Contact Guard/Touching assist     Toileting Toileting    Toileting assist Assist for toileting: Supervision/Verbal cueing Assistive Device Comment: urinal   Transfers Chair/bed transfer  Transfers assist     Chair/bed transfer assist level: Contact Guard/Touching assist     Locomotion Ambulation   Ambulation assist      Assist level: Contact Guard/Touching assist Assistive device: Walker-rolling Max distance:  5 ft   Walk 10 feet activity   Assist  Walk 10 feet activity did not occur: Safety/medical concerns        Walk 50 feet activity   Assist Walk 50 feet with 2 turns activity did not occur: Safety/medical  concerns         Walk 150 feet activity   Assist Walk 150 feet activity did not occur: Safety/medical concerns         Walk 10 feet on uneven surface  activity   Assist Walk 10 feet on uneven surfaces activity did not occur: Safety/medical concerns         Wheelchair     Assist Will patient use wheelchair at discharge?: Yes Type of Wheelchair: Manual Wheelchair activity did not occur: Safety/medical concerns  Wheelchair assist level: Supervision/Verbal cueing Max wheelchair distance: 150'    Wheelchair 50 feet with 2 turns activity    Assist    Wheelchair 50 feet with 2 turns activity did not occur: Safety/medical concerns   Assist Level: Supervision/Verbal cueing   Wheelchair 150 feet activity     Assist  Wheelchair 150 feet activity did not occur: Safety/medical concerns   Assist Level: Supervision/Verbal cueing   Blood pressure 135/63, pulse 72, temperature 98.3 F (36.8 C), temperature source Oral, resp. rate 18, height 5\' 11"  (1.803 m), weight 110.7 kg, SpO2 98 %. Medical Problem List and Plan: 1. Deficits with mobility, transfers, endurance, self-care secondary to left BKA secondary to osteomyelitis.             -patient may shower with stump covered             -ELOS/Goals: 10-15 days/supervision/mod I             --Continue CIR therapies including PT, OT 2.  Antithrombotics: -DVT/anticoagulation:  Pharmaceutical: Lovenox             -antiplatelet therapy: N/A 3. Pain Management:             Monitor postoperative pain with increased exertion             really no phantom limb pain at present  11/17: d/ced oxycodone and ordered tylenol w/ codeine instead for "soreness"- denies residual limb pain. 11/18: this change was effective for him. 11/19: denies pain. 4. Mood: Team support             -antipsychotic agents: N/A 5. Neuropsych: This patient is capable of making decisions on his own behalf. 6. Skin/Wound Care:  -right heel ulcer:  foam dressing, PRAFO, ortho has seen also  -left BKA incision: telfa, kerlix, ACE, stump shrinker, incision examined and with slight bright red blood- no purulence. Healing well.   -sacrum pressure relief 7. Fluids/Electrolytes/Nutrition: Monitor intake/output.  Heart healthy diet.             encourage appropriate PO  Electrolytes are stable 11/15 8.  T2DM: Monitor BS ac/hs. Continue Glyburide to 10mg  BID.   11/18: hypoglycemic to 50s: decrease lunch and supper coverage to 3U and keep breakfast coverage at 6U. D/c HS detemir 9. Acute on chronic anemia:              Hb 10-->8.5 on 11/9--> 8.1-->8.7 10. Thrombocytopenia             platelets 131k 11/11 11. Renal transplant/CKD IIIb:             Followed by Dr. [name].  BUN/Cr 37/1.95, Cr is 1.96 on 11/15  -Aspirin restarted as cleared by nephrology 12. Aortic stenosis/fluid overload: Continue Lasix. Monitor weights and for signs of overload.   Danley Danker Weights   06/17/20 0521 06/18/20 0508 06/19/20 0500  Weight: 107.8 kg 109 kg 110.7 kg    13. Left elbow tendinitis/bursitis: Ice, compression, pressure relief. 14. Constipation  11/15: had BM with sorbitol on 11/14 15. Disposition: completed letter stating that patient is medically unstable to participate in jury duty. Discussed with SW that patient will benefit from handicap placard.         LOS: 9 days A FACE TO FACE EVALUATION WAS PERFORMED  Malacai Grantz P Rossanna Spitzley 06/19/2020, 10:10 AM

## 2020-06-20 ENCOUNTER — Inpatient Hospital Stay (HOSPITAL_COMMUNITY): Payer: BC Managed Care – PPO

## 2020-06-20 ENCOUNTER — Encounter (HOSPITAL_COMMUNITY): Payer: BC Managed Care – PPO

## 2020-06-20 ENCOUNTER — Ambulatory Visit (HOSPITAL_COMMUNITY): Payer: BC Managed Care – PPO

## 2020-06-20 LAB — GLUCOSE, CAPILLARY
Glucose-Capillary: 126 mg/dL — ABNORMAL HIGH (ref 70–99)
Glucose-Capillary: 156 mg/dL — ABNORMAL HIGH (ref 70–99)
Glucose-Capillary: 127 mg/dL — ABNORMAL HIGH (ref 70–99)
Glucose-Capillary: 138 mg/dL — ABNORMAL HIGH (ref 70–99)

## 2020-06-20 NOTE — Progress Notes (Signed)
Occupational Therapy Session Note  Patient Details  Name: Steven [name] MRN: [id number] Date of Birth: August 12, 1955  Today's Date: 06/20/2020 OT Individual Time: 1000-1045 OT Individual Time Calculation (min): 45 min    Short Term Goals: Week 1:  OT Short Term Goal 1 (Week 1): Pt will complete squat pivot transfer w/c<>drop arm commode with CGA OT Short Term Goal 1 - Progress (Week 1): Met OT Short Term Goal 2 (Week 1): Pt will complete LB dressing with CGA OT Short Term Goal 2 - Progress (Week 1): Met OT Short Term Goal 3 (Week 1): Pt will complete UB/LB bathing with CGA OT Short Term Goal 3 - Progress (Week 1): Met OT Short Term Goal 4 (Week 1): Pt will achieve 0 degrees left elbow passive and active extension. OT Short Term Goal 4 - Progress (Week 1): Met  Skilled Therapeutic Interventions/Progress Updates:    1:1. Pt received with wendy and will in room. Pt and OT review squat pivot and stand pivot transfers with RW. Wife and son participate in multiple transfers w/c<>mat or recliner in room. OT and pt discuss bathroom set up, use of elevated seat, RW and safety modifications in the shower (sticker strips) non skid shoe for transfers into/out of shower, TTB use, HH shower head). Pt and family verbalized understanding. Pt able to complete all transfers with CGA with use of gait belt. Reviewed w/c parts management but pt able to manage independently. No further questions from family. Returned to room, Exited session with pt seated in recliner, exit alarm on and call light in reach  Therapy Documentation Precautions:  Precautions Precautions: Fall Precaution Comments: L BKA wound back; watch SpO2/HR Required Braces or Orthoses: Other Brace Other Brace: limb protector Restrictions Weight Bearing Restrictions: Yes LLE Weight Bearing: Non weight bearing General:   Vital Signs:   Pain:   ADL: ADL Grooming: Setup Where Assessed-Grooming: Sitting at sink Upper Body Bathing:  Setup Where Assessed-Upper Body Bathing: Sitting at sink Lower Body Bathing: Minimal assistance Where Assessed-Lower Body Bathing: Sitting at sink, Standing at sink Upper Body Dressing: Setup Where Assessed-Upper Body Dressing: Sitting at sink Where Assessed-Lower Body Dressing: Sitting at sink, Standing at sink Toilet Transfer Method: Engineer, water: Drop arm bedside commode Vision   Perception    Praxis   Exercises:   Other Treatments:     Therapy/Group: Individual Therapy  Tonny Branch 06/20/2020, 10:51 AM

## 2020-06-20 NOTE — Progress Notes (Addendum)
[facility] PHYSICAL MEDICINE & REHABILITATION PROGRESS NOTE   Subjective/Complaints:  Doing well this am , BMs "regulated", family coming in for training today  ROS:  Pt denies SOB, abd pain, CP, N/V/C/D, and vision changes  Objective:   No results found. No results for input(s): WBC, HGB, HCT, PLT in the last 72 hours. No results for input(s): NA, K, CL, CO2, GLUCOSE, BUN, CREATININE, CALCIUM in the last 72 hours.  Intake/Output Summary (Last 24 hours) at 06/20/2020 0714 Last data filed at 06/20/2020 9485 Gross per 24 hour  Intake 1708 ml  Output 1650 ml  Net 58 ml     Pressure Injury 06/08/20 Coccyx Stage 1 -  Intact skin with non-blanchable redness of a localized area usually over a bony prominence. (Active)  06/08/20 1500  Location: Coccyx  Location Orientation:   Staging: Stage 1 -  Intact skin with non-blanchable redness of a localized area usually over a bony prominence.  Wound Description (Comments):   Present on Admission: Yes (present upon transfer)    Physical Exam: Vital Signs Blood pressure 134/66, pulse 69, temperature 98.7 F (37.1 C), resp. rate 18, height 5\' 11"  (1.803 m), weight 108.8 kg, SpO2 99 %.  General: No acute distress Mood and affect are appropriate Heart: Regular rate and rhythm no rubs murmurs or extra sounds Lungs: Clear to auscultation, breathing unlabored, no rales or wheezes Abdomen: Positive bowel sounds, soft nontender to palpation, nondistended Extremities: No clubbing, cyanosis, or edema   Skin: Sacral pressure injury-stage 1. Right heel wound with eschar--unchanged. Vascular changes RLE Neuro: Pt is cognitively appropriate with normal insight, memory, and awareness. Cranial nerves 2-12 are intact. Sensory exam is normal. Reflexes are 2+ in all 4's. Fine motor coordination is intact. No tremors. Motor function is grossly 5/5.  Musculoskeletal: left BK with mild tenderness to palpation. Stump remains edematous. Full knee extension    Assessment/Plan: 1. Functional deficits which require 3+ hours per day of interdisciplinary therapy in a comprehensive inpatient rehab setting.  Physiatrist is providing close team supervision and 24 hour management of active medical problems listed below.  Physiatrist and rehab team continue to assess barriers to discharge/monitor patient progress toward functional and medical goals  Care Tool:  Bathing    Body parts bathed by patient: Right arm, Left arm, Chest, Abdomen, Front perineal area, Buttocks, Right upper leg, Left upper leg, Right lower leg, Face     Body parts n/a: Left lower leg   Bathing assist Assist Level: Supervision/Verbal cueing     Upper Body Dressing/Undressing Upper body dressing   What is the patient wearing?: Pull over shirt    Upper body assist Assist Level: Supervision/Verbal cueing    Lower Body Dressing/Undressing Lower body dressing      What is the patient wearing?: Underwear/pull up, Pants     Lower body assist Assist for lower body dressing: Contact Guard/Touching assist     Toileting Toileting    Toileting assist Assist for toileting: Supervision/Verbal cueing Assistive Device Comment: urinal   Transfers Chair/bed transfer  Transfers assist     Chair/bed transfer assist level: Contact Guard/Touching assist     Locomotion Ambulation   Ambulation assist      Assist level: Contact Guard/Touching assist Assistive device: Walker-rolling Max distance: 5 ft   Walk 10 feet activity   Assist  Walk 10 feet activity did not occur: Safety/medical concerns        Walk 50 feet activity   Assist Walk 50 feet with 2  turns activity did not occur: Safety/medical concerns         Walk 150 feet activity   Assist Walk 150 feet activity did not occur: Safety/medical concerns         Walk 10 feet on uneven surface  activity   Assist Walk 10 feet on uneven surfaces activity did not occur: Safety/medical  concerns         Wheelchair     Assist Will patient use wheelchair at discharge?: Yes Type of Wheelchair: Manual Wheelchair activity did not occur: Safety/medical concerns  Wheelchair assist level: Supervision/Verbal cueing Max wheelchair distance: 150'    Wheelchair 50 feet with 2 turns activity    Assist    Wheelchair 50 feet with 2 turns activity did not occur: Safety/medical concerns   Assist Level: Supervision/Verbal cueing   Wheelchair 150 feet activity     Assist  Wheelchair 150 feet activity did not occur: Safety/medical concerns   Assist Level: Supervision/Verbal cueing   Blood pressure 134/66, pulse 69, temperature 98.7 F (37.1 C), resp. rate 18, height 5\' 11"  (1.803 m), weight 108.8 kg, SpO2 99 %. Medical Problem List and Plan: 1. Deficits with mobility, transfers, endurance, self-care secondary to left BKA secondary to osteomyelitis.             -patient may shower with stump covered             -ELOS/Goals: 10-15 days/supervision/mod I             --Continue CIR therapies including PT, OT 2.  Antithrombotics: -DVT/anticoagulation:  Pharmaceutical: Lovenox             -antiplatelet therapy: N/A 3. Pain Management:             Monitor postoperative pain with increased exertion             really no phantom limb pain at present  11/17: d/ced oxycodone and ordered tylenol w/ codeine instead for "soreness"- denies residual limb pain. 11/18: this change was effective for him. 11/19: denies pain. 4. Mood: Team support             -antipsychotic agents: N/A 5. Neuropsych: This patient is capable of making decisions on his own behalf. 6. Skin/Wound Care:  -right heel ulcer: foam dressing, PRAFO, ortho has seen also  -left BKA incision: telfa, kerlix, ACE, stump shrinker, incision examined and with slight bright red blood- no purulence. Healing well.   -sacrum pressure relief 7. Fluids/Electrolytes/Nutrition: Monitor intake/output.  Heart healthy  diet.             encourage appropriate PO  Electrolytes are stable 11/15 8.  T2DM: Monitor BS ac/hs. Continue Glyburide to 10mg  BID.   11/18: hypoglycemic to 50s: decrease lunch and supper coverage to 3U and keep breakfast coverage at 6U. D/c HS detemir CBG (last 3)  Recent Labs    06/19/20 1651 06/19/20 2055 06/20/20 0613  GLUCAP 83 149* 126*  controlled 11/20 9. Acute on chronic anemia:              Hb 10-->8.5 on 11/9--> 8.1-->8.7 10. Thrombocytopenia             platelets 131k 11/11 11. Renal transplant/CKD IIIb:             Followed by Dr. [name].              BUN/Cr 37/1.95, Cr is 1.96 on 11/15  -Aspirin restarted as cleared by nephrology 12. Aortic stenosis/fluid overload:  Continue Lasix. Monitor weights and for signs of overload.   Danley Danker Weights   06/18/20 0508 06/19/20 0500 06/20/20 0617  Weight: 109 kg 110.7 kg 108.8 kg    13. Left elbow tendinitis/bursitis: Ice, compression, pressure relief. 14. Constipation  11/15: had BM with sorbitol on 11/14 15. Disposition: completed letter stating that patient is medically unstable to participate in jury duty. Discussed with SW that patient will benefit from handicap placard.         LOS: 10 days A FACE TO FACE EVALUATION WAS PERFORMED  Charlett Blake 06/20/2020, 7:14 AM

## 2020-06-20 NOTE — Progress Notes (Signed)
Physical Therapy Session Note  Patient Details  Name: Steven [name] MRN: [id number] Date of Birth: February 23, 1956  Today's Date: 06/20/2020 PT Individual Time: 1300-1400 PT Individual Time Calculation (min): 60 min   Short Term Goals: Week 2:  PT Short Term Goal 1 (Week 2): STG = LTG due to ELOS  Skilled Therapeutic Interventions/Progress Updates:     Patient in recliner with his parents in the room upon PT arrival. Patient alert and agreeable to PT session. Patient denied pain during session. [name] parents participated in hands-on family education throughout session.   Patient reports he will need to ambulate through the bathroom doorway and into the bathroom and through his bedroom doorway ~3 ft to his bed. Reports a small threshold or step, ~1-2", to enter the bathroom. Discussed toileting and showering. Talked through toileting and showering safety and assist and Instructed patient's parents to review toileting and bathing with patient's family that was present this morning with OT.   Therapeutic Activity: Bed Mobility: Patient performed supine to/from sit with in the ADL bed independently.  Transfers: Patient performed sit to/from stand from the w/c x3, ADL bed to simulate home set up, and the hospital recliner to simulate the patient's personal, elevated recliner. He performed stand pivot transfers and ambulatory transfers up to 4 feet. Patient performed all transfers with CGA using the RW with assist from PT for demonstration of first transfer and assist from his parents for all subsequent transfers. Provided verbal cues for reaching back to sit and safe guarding technique. Educated on planning out transfer and having a clear space to assist the patient. Demonstrated assisted "falls" onto the bed and into the w/c with PT as the patient to demonstrate how to safely assist the patient in the event of LOB during a transfer.  Patient demonstrated technique using car simulator for  pulling up using the car frame. Patient performed sit to stand with CGA and min A for LOB using the car frame and door for steadying support in standing. Did not complete car transfer due to car seat not being available at this time. Educated patient on safety concerns with technique. Instructed patient to perform stand pivot transfer into the vehicle using the RW. He performed a simulated mid size SUV height car transfer on the mat table with CGA using RW. Provided cues for safe technique.  Wheelchair Mobility:  Patient propelled wheelchair >100 feet x3 with mod I. Provided verbal cues and demonstration of w/c parts management and folding and lifting the w/c in the a vehicle to patient's parents. Instructed them to have lifting assist from other family members for w/c for safety. Patient and his parents in agreement. Patient independent with use of breaks and management of leg rest and limb guard during session.   Educated patient and his family on fall risk/prevention, home modifications to prevent falls, and activation of emergency services in the event of a fall during session.   Noted L w/c wheel sliding during transfers due to poor tire pressure. Removed w/c from patient's room and labeled equipment as out of order. Provided patient with another 22"x18" w/c at end of session.   Patient sitting EOB with his parents in his room at end of session with breaks locked, bed alarm set, and all needs within reach.    Therapy Documentation Precautions:  Precautions Precautions: Fall Precaution Comments: L BKA wound back; watch SpO2/HR Required Braces or Orthoses: Other Brace Other Brace: limb protector Restrictions Weight Bearing Restrictions: Yes LLE Weight Bearing:  Non weight bearing   Therapy/Group: Individual Therapy  Michaeleen Down L Analis Distler PT, DPT  06/20/2020, 8:40 PM

## 2020-06-21 LAB — GLUCOSE, CAPILLARY
Glucose-Capillary: 162 mg/dL — ABNORMAL HIGH (ref 70–99)
Glucose-Capillary: 152 mg/dL — ABNORMAL HIGH (ref 70–99)
Glucose-Capillary: 155 mg/dL — ABNORMAL HIGH (ref 70–99)
Glucose-Capillary: 217 mg/dL — ABNORMAL HIGH (ref 70–99)

## 2020-06-21 NOTE — Progress Notes (Addendum)
[place] PHYSICAL MEDICINE & REHABILITATION PROGRESS NOTE   Subjective/Complaints:  Pt reports good family training session   ROS:  Pt denies SOB, abd pain, CP, N/V/C/D, and vision changes  Objective:   No results found. No results for input(s): WBC, HGB, HCT, PLT in the last 72 hours. No results for input(s): NA, K, CL, CO2, GLUCOSE, BUN, CREATININE, CALCIUM in the last 72 hours.  Intake/Output Summary (Last 24 hours) at 06/21/2020 0800 Last data filed at 06/21/2020 5397 Gross per 24 hour  Intake --  Output 1150 ml  Net -1150 ml     Pressure Injury 06/08/20 Coccyx Stage 1 -  Intact skin with non-blanchable redness of a localized area usually over a bony prominence. (Active)  06/08/20 1500  Location: Coccyx  Location Orientation:   Staging: Stage 1 -  Intact skin with non-blanchable redness of a localized area usually over a bony prominence.  Wound Description (Comments):   Present on Admission: Yes (present upon transfer)    Physical Exam: Vital Signs Blood pressure 132/73, pulse 70, temperature 98.5 F (36.9 C), temperature source Oral, resp. rate 18, height 5\' 11"  (1.803 m), weight 108 kg, SpO2 99 %.   General: No acute distress Mood and affect are appropriate Heart: Regular rate and rhythm no rubs murmurs or extra sounds Lungs: Clear to auscultation, breathing unlabored, no rales or wheezes Abdomen: Positive bowel sounds, soft nontender to palpation, nondistended Extremities: No clubbing, cyanosis, or edema Skin: No evidence of breakdown, no evidence of rash   Skin: Sacral pressure injury- stage 1. Right heel wound with eschar--unchanged. Vascular changes RLE Neuro: Pt is cognitively appropriate with normal insight, memory, and awareness. Cranial nerves 2-12 are intact. Sensory exam is normal. Reflexes are 2+ in all 4's. Fine motor coordination is intact. No tremors. Motor function is grossly 5/5.  Musculoskeletal: left BK with mild tenderness to palpation.  Stump remains edematous. Full knee extension   Assessment/Plan: 1. Functional deficits which require 3+ hours per day of interdisciplinary therapy in a comprehensive inpatient rehab setting.  Physiatrist is providing close team supervision and 24 hour management of active medical problems listed below.  Physiatrist and rehab team continue to assess barriers to discharge/monitor patient progress toward functional and medical goals  Care Tool:  Bathing    Body parts bathed by patient: Right arm, Left arm, Chest, Abdomen, Front perineal area, Buttocks, Right upper leg, Left upper leg, Right lower leg, Face     Body parts n/a: Left lower leg   Bathing assist Assist Level: Supervision/Verbal cueing     Upper Body Dressing/Undressing Upper body dressing   What is the patient wearing?: Pull over shirt    Upper body assist Assist Level: Supervision/Verbal cueing    Lower Body Dressing/Undressing Lower body dressing      What is the patient wearing?: Underwear/pull up, Pants     Lower body assist Assist for lower body dressing: Contact Guard/Touching assist     Toileting Toileting    Toileting assist Assist for toileting: Supervision/Verbal cueing Assistive Device Comment: urinal   Transfers Chair/bed transfer  Transfers assist     Chair/bed transfer assist level: Contact Guard/Touching assist     Locomotion Ambulation   Ambulation assist      Assist level: Contact Guard/Touching assist Assistive device: Walker-rolling Max distance: 5 ft   Walk 10 feet activity   Assist  Walk 10 feet activity did not occur: Safety/medical concerns        Walk 50 feet activity  Assist Walk 50 feet with 2 turns activity did not occur: Safety/medical concerns         Walk 150 feet activity   Assist Walk 150 feet activity did not occur: Safety/medical concerns         Walk 10 feet on uneven surface  activity   Assist Walk 10 feet on uneven surfaces  activity did not occur: Safety/medical concerns         Wheelchair     Assist Will patient use wheelchair at discharge?: Yes Type of Wheelchair: Manual Wheelchair activity did not occur: Safety/medical concerns  Wheelchair assist level: Supervision/Verbal cueing Max wheelchair distance: 150'    Wheelchair 50 feet with 2 turns activity    Assist    Wheelchair 50 feet with 2 turns activity did not occur: Safety/medical concerns   Assist Level: Supervision/Verbal cueing   Wheelchair 150 feet activity     Assist  Wheelchair 150 feet activity did not occur: Safety/medical concerns   Assist Level: Supervision/Verbal cueing   Blood pressure 132/73, pulse 70, temperature 98.5 F (36.9 C), temperature source Oral, resp. rate 18, height 5\' 11"  (1.803 m), weight 108 kg, SpO2 99 %. Medical Problem List and Plan: 1. Deficits with mobility, transfers, endurance, self-care secondary to left BKA secondary to osteomyelitis. Excellent ROM Left knee              -patient may shower with stump covered             -ELOS/Goals: 10-15 days/supervision/mod I             --Continue CIR therapies including PT, OT 2.  Antithrombotics: -DVT/anticoagulation:  Pharmaceutical: Lovenox             -antiplatelet therapy: N/A 3. Pain Management:             Monitor postoperative pain with increased exertion             really no phantom limb pain at present  11/17: d/ced oxycodone and ordered tylenol w/ codeine instead for "soreness"- denies residual limb pain. 11/18: this change was effective for him. 11/19: denies pain. 4. Mood: Team support             -antipsychotic agents: N/A 5. Neuropsych: This patient is capable of making decisions on his own behalf. 6. Skin/Wound Care:  -right heel ulcer: foam dressing, PRAFO, ortho has seen also  -left BKA incision: telfa, kerlix, ACE, stump shrinker, incision examined and with slight bright red blood- no purulence. Healing well.   -sacrum  pressure relief 7. Fluids/Electrolytes/Nutrition: Monitor intake/output.  Heart healthy diet.             encourage appropriate PO  Electrolytes are stable 11/15 8.  T2DM: Monitor BS ac/hs. Continue Glyburide to 10mg  BID.   11/18: hypoglycemic to 50s: decrease lunch and supper coverage to 3U and keep breakfast coverage at 6U. D/c HS detemir CBG (last 3)  Recent Labs    06/20/20 1627 06/20/20 2101 06/21/20 0614  GLUCAP 127* 138* 162*  controlled 11/21 9. Acute on chronic anemia:              Hb 10-->8.5 on 11/9--> 8.1-->8.7 10. Thrombocytopenia             platelets 131k 11/11 11. Renal transplant/CKD IIIb:             Followed by Dr. [name].              BUN/Cr 37/1.95, Cr is  1.96 on 11/15  -Aspirin restarted as cleared by nephrology 12. Aortic stenosis/fluid overload: Continue Lasix. Monitor weights and for signs of overload.   Danley Danker Weights   06/19/20 0500 06/20/20 0617 06/21/20 0400  Weight: 110.7 kg 108.8 kg 108 kg    13. Left elbow tendinitis/bursitis: Ice, compression, pressure relief. 14. Constipation  11/15: had BM with sorbitol on 11/14 15. Disposition: completed letter stating that patient is medically unstable to participate in jury duty. Discussed with SW that patient will benefit from handicap placard.         LOS: 11 days A FACE TO FACE EVALUATION WAS PERFORMED  Charlett Blake 06/21/2020, 8:00 AM

## 2020-06-22 ENCOUNTER — Inpatient Hospital Stay (HOSPITAL_COMMUNITY): Payer: BC Managed Care – PPO | Admitting: Occupational Therapy

## 2020-06-22 ENCOUNTER — Inpatient Hospital Stay (HOSPITAL_COMMUNITY): Payer: BC Managed Care – PPO | Admitting: Physical Therapy

## 2020-06-22 DIAGNOSIS — I739 Peripheral vascular disease, unspecified: Secondary | ICD-10-CM

## 2020-06-22 LAB — CBC
HCT: 27.4 % — ABNORMAL LOW (ref 39.0–52.0)
Hemoglobin: 8.4 g/dL — ABNORMAL LOW (ref 13.0–17.0)
MCH: 25.5 pg — ABNORMAL LOW (ref 26.0–34.0)
MCHC: 30.7 g/dL (ref 30.0–36.0)
MCV: 83.3 fL (ref 80.0–100.0)
Platelets: 138 10*3/uL — ABNORMAL LOW (ref 150–400)
RBC: 3.29 MIL/uL — ABNORMAL LOW (ref 4.22–5.81)
RDW: 16.1 % — ABNORMAL HIGH (ref 11.5–15.5)
WBC: 2.7 10*3/uL — ABNORMAL LOW (ref 4.0–10.5)
nRBC: 0 % (ref 0.0–0.2)

## 2020-06-22 LAB — BASIC METABOLIC PANEL
Anion gap: 11 (ref 5–15)
BUN: 30 mg/dL — ABNORMAL HIGH (ref 8–23)
CO2: 23 mmol/L (ref 22–32)
Calcium: 8.5 mg/dL — ABNORMAL LOW (ref 8.9–10.3)
Chloride: 107 mmol/L (ref 98–111)
Creatinine, Ser: 1.96 mg/dL — ABNORMAL HIGH (ref 0.61–1.24)
GFR, Estimated: 38 mL/min — ABNORMAL LOW (ref >60)
Glucose, Bld: 155 mg/dL — ABNORMAL HIGH (ref 70–99)
Potassium: 4.3 mmol/L (ref 3.5–5.1)
Sodium: 141 mmol/L (ref 135–145)

## 2020-06-22 LAB — GLUCOSE, CAPILLARY
Glucose-Capillary: 148 mg/dL — ABNORMAL HIGH (ref 70–99)
Glucose-Capillary: 207 mg/dL — ABNORMAL HIGH (ref 70–99)
Glucose-Capillary: 141 mg/dL — ABNORMAL HIGH (ref 70–99)
Glucose-Capillary: 108 mg/dL — ABNORMAL HIGH (ref 70–99)

## 2020-06-22 NOTE — Progress Notes (Signed)
Physical Therapy Session Note  Patient Details  Name: Steven [name] MRN: [id number] Date of Birth: [birth year]  Today's Date: 06/22/2020 PT Individual Time: 0900-1000 PT Individual Time Calculation (min): 60 min   Short Term Goals: Week 2:  PT Short Term Goal 1 (Week 2): STG = LTG due to ELOS  Skilled Therapeutic Interventions/Progress Updates:    Pt received seated in bed, agreeable to PT session. No complaints of pain. Bed mobility Supervision. Squat pivot transfer to w/c with Supervision throughout session. Pt is independent for management of w/c parts. Manual w/c propulsion up to 150 ft with use of BUE at mod I level. Pt requesting to focus on BUE strengthening this date. Seated BUE 5# weighted dowel exercises: bicep curls, chest press, OH press 2 x 10 reps each. Seated BUE and core 2 kg weighted ball therex: punch-outs, OH lifts, L/R diagonals x 10 reps each. Provided handout for BLE strengthening HEP and reviewed with patient. Reviewed residual limb positioning with pt demonstrating good understanding. Pt left seated in w/c in room with needs in reach at end of session.  Therapy Documentation Precautions:  Precautions Precautions: Fall Precaution Comments: L BKA wound back; watch SpO2/HR Required Braces or Orthoses: Other Brace Other Brace: limb protector Restrictions Weight Bearing Restrictions: Yes LLE Weight Bearing: Non weight bearing   Therapy/Group: Individual Therapy   Excell Seltzer, PT, DPT  06/22/2020, 12:29 PM

## 2020-06-22 NOTE — Progress Notes (Signed)
Patient ID: Steven [name], male   DOB: [birth year], 64 y.o.   MRN: [id number]  SW met with pt in room to discuss HHA preference. Prefers [place]. Pt states he spoke with [employer] about his DME. SW informed will follow-up once updates from [facility].   SW sent referral to Erin/[facility] for HHPT/OT/SN and SW waiting on follow-up.   *SW received updates from [facility] who reported branch declined due to insurance and staffing due to holiday.   SW sent out [facility] referral to several agencies and waiting on follow-up.   Update: Teresa/[facility]- declined due to staffing [facility]- declined due to insurance and staffing due to holiday weekend Angela/[facility]- waiting on update Cheryl/[facility]- waiting on update Carolyn/[facility]- waiting on update Cory/[facility]- waiting on update Amy/[facility]-waiting on update Britney/[facility]-declined due to staffing  Loralee Pacas, MSW, [place] Office: [phone] Cell: [phone] Fax: [phone]

## 2020-06-22 NOTE — Progress Notes (Signed)
Occupational Therapy Session Note  Patient Details  Name: Steven [name] MRN: [id number] Date of Birth: [birth year]  Today's Date: 06/22/2020 OT Individual Time: 1045-1200 and 1450-1533 OT Individual Time Calculation (min): 75 min and 43 min  Short Term Goals: Week 2:  OT Short Term Goal 1 (Week 2): LTG=STG 2/2 ELOS    Skilled Therapeutic Interventions/Progress Updates:    Pt greeted in the w/c with no c/o pain. He wanted to shower tomorrow before d/c on Wednesday but asked if he could be setup at the sink for bathing today. Pt first retrieved his clothing w/c level from dresser drawer and removed his leg rests. Bathing/dressing tasks were then completed at sit<stand level, supervision for dynamic standing balance during LB self care. Independent for oral care and grooming task completion while seated. He declined washing his foot or doffing/donning footwear, stated that he used his LH mirror this morning when MD was assessing his skin. Taught pt how to wrap his residual limb with pt exhibiting understanding given hands on practice and instruction. At end of session pt was left in care of RN.   2nd Session 1:1 tx (43 min) Pt greeted in the w/c with no c/o pain. Agreeable to session. Started with simple meal prep and light housekeeping tasks in the therapy apartment. Pt engaged in simulated tasks with guided instruction and supervision, locking w/c brakes at appropriate times during dynamic sitting and in prep for sit<stand at supportive countertops to retrieve items from higher shelves. He reports having a long handled dustpan at home, pt practiced sweeping w/c level and then returning broom + dustpan back to the pantry closet. Discussed purchasing a w/c lap tray to increase ease of item transport and also a cup holder addition to w/c. Transitioned to focus on UB strengthening and community mobility with pt self propelling around the atrium/food court of the hospital. Min vcs for safely backing  up in the elevator when needed. Once he returned to the room, pt remained sitting up in the w/c. Left him with all needs within reach.   Therapy Documentation Precautions:  Precautions Precautions: Fall Precaution Comments: L BKA wound back; watch SpO2/HR Required Braces or Orthoses: Other Brace Other Brace: limb protector Restrictions Weight Bearing Restrictions: Yes LLE Weight Bearing: Non weight bearing ADL: ADL Grooming: Setup Where Assessed-Grooming: Sitting at sink Upper Body Bathing: Setup Where Assessed-Upper Body Bathing: Sitting at sink Lower Body Bathing: Minimal assistance Where Assessed-Lower Body Bathing: Sitting at sink, Standing at sink Upper Body Dressing: Setup Where Assessed-Upper Body Dressing: Sitting at sink Where Assessed-Lower Body Dressing: Sitting at sink, Standing at sink Toilet Transfer Method: Squat pivot Toilet Transfer Equipment: Drop arm bedside commode      Therapy/Group: Individual Therapy  Libbie Bartley A Juvon Teater 06/22/2020, 12:21 PM

## 2020-06-22 NOTE — Progress Notes (Signed)
PHYSICAL MEDICINE & REHABILITATION PROGRESS NOTE   Subjective/Complaints:  Had a good weekend. Family ed went well. Minimal pain. Occasional phantom cramping in left toes  ROS: Patient denies fever, rash, sore throat, blurred vision, nausea, vomiting, diarrhea, cough, shortness of breath or chest pain, joint or back pain, headache, or mood change.    Objective:   No results found. Recent Labs    06/22/20 0523  WBC 2.7*  HGB 8.4*  HCT 27.4*  PLT 138*   Recent Labs    06/22/20 0523  NA 141  K 4.3  CL 107  CO2 23  GLUCOSE 155*  BUN 30*  CREATININE 1.96*  CALCIUM 8.5*    Intake/Output Summary (Last 24 hours) at 06/22/2020 1004 Last data filed at 06/22/2020 0959 Gross per 24 hour  Intake 822 ml  Output 1410 ml  Net -588 ml     Pressure Injury 06/08/20 Coccyx Stage 1 -  Intact skin with non-blanchable redness of a localized area usually over a bony prominence. (Active)  06/08/20 1500  Location: Coccyx  Location Orientation:   Staging: Stage 1 -  Intact skin with non-blanchable redness of a localized area usually over a bony prominence.  Wound Description (Comments):   Present on Admission: Yes (present upon transfer)    Physical Exam: Vital Signs Blood pressure 131/68, pulse 66, temperature 98 F (36.7 C), temperature source Oral, resp. rate 18, height 5\' 11"  (1.803 m), weight 108.8 kg, SpO2 98 %.   Constitutional: No distress . Vital signs reviewed. HEENT: EOMI, oral membranes moist Neck: supple Cardiovascular: RRR without murmur. No JVD    Respiratory/Chest: CTA Bilaterally without wheezes or rales. Normal effort    GI/Abdomen: BS +, non-tender, non-distended Ext: no clubbing, cyanosis  Psych: pleasant and cooperative Skin: Sacral pressure injury- stage 1. Right heel wound with eschar which is almost free from granulated base. Left BK incision well approximated with minimal s/s drainage scab.  Vascular changes RLE Neuro: Pt is cognitively  appropriate with normal insight, memory, and awareness. Cranial nerves 2-12 are intact. Sensory exam is normal. Reflexes are 2+ in all 4's. Motor 5/5.  Musculoskeletal: normal left knee ROM. Stump with decreased edema   Assessment/Plan: 1. Functional deficits which require 3+ hours per day of interdisciplinary therapy in a comprehensive inpatient rehab setting.  Physiatrist is providing close team supervision and 24 hour management of active medical problems listed below.  Physiatrist and rehab team continue to assess barriers to discharge/monitor patient progress toward functional and medical goals  Care Tool:  Bathing    Body parts bathed by patient: Right arm, Left arm, Chest, Abdomen, Front perineal area, Buttocks, Right upper leg, Left upper leg, Right lower leg, Face     Body parts n/a: Left lower leg   Bathing assist Assist Level: Supervision/Verbal cueing     Upper Body Dressing/Undressing Upper body dressing   What is the patient wearing?: Pull over shirt    Upper body assist Assist Level: Supervision/Verbal cueing    Lower Body Dressing/Undressing Lower body dressing      What is the patient wearing?: Underwear/pull up, Pants     Lower body assist Assist for lower body dressing: Contact Guard/Touching assist     Toileting Toileting    Toileting assist Assist for toileting: Supervision/Verbal cueing Assistive Device Comment: urinal   Transfers Chair/bed transfer  Transfers assist     Chair/bed transfer assist level: Contact Guard/Touching assist     Locomotion Ambulation   Ambulation assist  Assist level: Contact Guard/Touching assist Assistive device: Walker-rolling Max distance: 5 ft   Walk 10 feet activity   Assist  Walk 10 feet activity did not occur: Safety/medical concerns        Walk 50 feet activity   Assist Walk 50 feet with 2 turns activity did not occur: Safety/medical concerns         Walk 150 feet  activity   Assist Walk 150 feet activity did not occur: Safety/medical concerns         Walk 10 feet on uneven surface  activity   Assist Walk 10 feet on uneven surfaces activity did not occur: Safety/medical concerns         Wheelchair     Assist Will patient use wheelchair at discharge?: Yes Type of Wheelchair: Manual Wheelchair activity did not occur: Safety/medical concerns  Wheelchair assist level: Supervision/Verbal cueing Max wheelchair distance: 150'    Wheelchair 50 feet with 2 turns activity    Assist    Wheelchair 50 feet with 2 turns activity did not occur: Safety/medical concerns   Assist Level: Supervision/Verbal cueing   Wheelchair 150 feet activity     Assist  Wheelchair 150 feet activity did not occur: Safety/medical concerns   Assist Level: Supervision/Verbal cueing   Blood pressure 131/68, pulse 66, temperature 98 F (36.7 C), temperature source Oral, resp. rate 18, height 5\' 11"  (1.803 m), weight 108.8 kg, SpO2 98 %. Medical Problem List and Plan: 1. Deficits with mobility, transfers, endurance, self-care secondary to left BKA secondary to osteomyelitis. Excellent ROM Left knee              -patient may shower with stump covered             -ELOS/Goals: 06/24/20 supervision/mod I             --Continue CIR therapies including PT, OT 2.  Antithrombotics: -DVT/anticoagulation:  Pharmaceutical: Lovenox             -antiplatelet therapy: N/A 3. Pain Management:             Monitor postoperative pain with increased exertion             really no phantom limb pain at present  11/17: d/ced oxycodone and ordered tylenol w/ codeine instead for "soreness"- denies residual limb pain. 11/18: this change was effective for him. 11/19: denies pain. 4. Mood: Team support             -antipsychotic agents: N/A 5. Neuropsych: This patient is capable of making decisions on his own behalf. 6. Skin/Wound Care:  -right heel ulcer: foam dressing,  PRAFO, ?silver  nitrate  -left BKA-telfa, kerlix, shrinker  -sacrum pressure relief 7. Fluids/Electrolytes/Nutrition: Monitor intake/output.  Heart healthy diet.             encourage appropriate PO  Electrolytes are stable 11/15 8.  T2DM: Monitor BS ac/hs. Continue Glyburide to 10mg  BID.   11/18: hypoglycemic to 50s: decreased lunch and supper coverage to 3U and keep breakfast coverage at 6U. D/c'ed HS detemir CBG (last 3)  Recent Labs    06/21/20 1632 06/21/20 2057 06/22/20 0610  GLUCAP 155* 217* 148*  PM elevation yesterday 11/21, increase levemir to 12u QAM 9. Acute on chronic anemia:              Hb 10-->8.5 on 11/9--> 8.1-->8.7 10. Thrombocytopenia             platelets 131k 11/11 11. Renal transplant/CKD IIIb:  Followed by Dr. [name].              BUN/Cr 37/1.95--->30/1.96 11/22  -Aspirin restarted as cleared by nephrology 12. Aortic stenosis/fluid overload: Continue Lasix. Monitor weights and for signs of overload.   Danley Danker Weights   06/20/20 0617 06/21/20 0400 06/22/20 0454  Weight: 108.8 kg 108 kg 108.8 kg    13. Left elbow tendinitis/bursitis: Ice, compression, pressure relief. 14. Constipation  11/15: had BM with sorbitol on 11/14          LOS: 12 days A FACE TO [place] 06/22/2020, 10:04 AM

## 2020-06-23 ENCOUNTER — Inpatient Hospital Stay (HOSPITAL_COMMUNITY): Payer: BC Managed Care – PPO

## 2020-06-23 ENCOUNTER — Inpatient Hospital Stay (HOSPITAL_COMMUNITY): Payer: BC Managed Care – PPO | Admitting: Occupational Therapy

## 2020-06-23 LAB — GLUCOSE, CAPILLARY
Glucose-Capillary: 136 mg/dL — ABNORMAL HIGH (ref 70–99)
Glucose-Capillary: 101 mg/dL — ABNORMAL HIGH (ref 70–99)
Glucose-Capillary: 114 mg/dL — ABNORMAL HIGH (ref 70–99)
Glucose-Capillary: 174 mg/dL — ABNORMAL HIGH (ref 70–99)

## 2020-06-23 MED ORDER — INSULIN ASPART 100 UNIT/ML ~~LOC~~ SOLN: SUBCUTANEOUS | 11 refills | Status: DC

## 2020-06-23 MED ORDER — ACETAMINOPHEN-CODEINE #3 300-30 MG PO TABS: 1.0000 | ORAL_TABLET | Freq: Four times a day (QID) | ORAL | 0 refills | Status: DC | PRN

## 2020-06-23 MED ORDER — TRAZODONE HCL 50 MG PO TABS: 50.0000 mg | ORAL_TABLET | Freq: Every day | ORAL | 0 refills | Status: AC

## 2020-06-23 MED ORDER — CYCLOBENZAPRINE HCL 5 MG PO TABS: 5.0000 mg | ORAL_TABLET | Freq: Three times a day (TID) | ORAL | 0 refills | Status: DC | PRN

## 2020-06-23 MED ORDER — DOCUSATE SODIUM 100 MG PO CAPS: 100.0000 mg | ORAL_CAPSULE | Freq: Two times a day (BID) | ORAL | 0 refills | Status: AC

## 2020-06-23 MED ORDER — ASPIRIN 81 MG PO TBEC: 81.0000 mg | DELAYED_RELEASE_TABLET | Freq: Every day | ORAL | 11 refills | Status: DC

## 2020-06-23 NOTE — Progress Notes (Signed)
Occupational Therapy Discharge Summary  Patient Details  Name: Steven [name] MRN: [id number] Date of Birth: 1955/11/21  Today's Date: 06/23/2020 OT Individual Time: 1100-1145 OT Individual Time Calculation (min): 45 min   Patient greeted seated in wc and agreable to OT treatment session focused on self-care retraining. Opt able to collect clothing from wc level and complete all BADL tasks, including transfers, and sit<>stands with supervision. OT provided pt with home exercises program and 2 different level therabands and reviewed exercises with pt. Pt left seated in wc at the sink to finish grooming tasks. Details on BADL performance noted below.    Patient has met 9 of 9 long term goals due to improved activity tolerance, improved balance, postural control and ability to compensate for deficits.  Patient to discharge at overall Supervision level.  [name] care partner is independent to provide the necessary physical assistance at discharge.    Reasons goals not met: n/a  Recommendation:  Patient will benefit from ongoing skilled OT services in outpatient setting (once patient receives prosthesis) to continue to advance functional skills in the area of BADL.  Equipment: wheelchair, drop arm BSC, tub transfer bench  Reasons for discharge: treatment goals met and discharge from hospital  Patient/family agrees with progress made and goals achieved: Yes  OT Discharge Precautions/Restrictions  Precautions Precautions: Fall Precaution Comments: L BKA  Restrictions Weight Bearing Restrictions: Yes LLE Weight Bearing: Non weight bearing Pain  denies pain ADL ADL Eating: Independent Grooming: Modified independent (from w/c) Where Assessed-Grooming: Sitting at sink Upper Body Bathing: Modified independent Where Assessed-Upper Body Bathing: Sitting at sink Lower Body Bathing: Supervision/safety Where Assessed-Lower Body Bathing: Sitting at sink, Standing at sink Upper Body  Dressing: Modified independent (Device) Where Assessed-Upper Body Dressing: Sitting at sink Lower Body Dressing: Supervision/safety Where Assessed-Lower Body Dressing: Sitting at sink, Standing at sink Toileting: Supervision/safety Toilet Transfer: Close supervision Toilet Transfer Method: Engineer, water: Drop arm Geophysical data processor: Close supervision Vision Baseline Vision/History: No visual deficits Perception  Perception: Within Functional Limits Praxis Praxis: Intact Cognition Overall Cognitive Status: Within Functional Limits for tasks assessed Arousal/Alertness: Awake/alert Orientation Level: Oriented X4 Safety/Judgment: Appears intact Sensation Sensation Light Touch: Appears Intact Hot/Cold: Appears Intact Coordination Gross Motor Movements are Fluid and Coordinated: Yes Fine Motor Movements are Fluid and Coordinated: Yes Motor  Motor Motor: Within Functional Limits Mobility  Bed Mobility Bed Mobility: Supine to Sit;Sit to Supine Supine to Sit: Independent Sit to Supine: Independent Transfers Sit to Stand: Independent with assistive device Stand to Sit: Independent with assistive device  Trunk/Postural Assessment  Cervical Assessment Cervical Assessment:  (forward head) Thoracic Assessment Thoracic Assessment:  (rounded shoulders) Lumbar Assessment Lumbar Assessment:  (posterior pelvic tilt) Postural Control Postural Control: Within Functional Limits  Balance Balance Balance Assessed: Yes Static Sitting Balance Static Sitting - Level of Assistance: 7: Independent Dynamic Sitting Balance Dynamic Sitting - Balance Support: During functional activity Dynamic Sitting - Level of Assistance: 7: Independent Static Standing Balance Static Standing - Balance Support: Bilateral upper extremity supported Static Standing - Level of Assistance: 5: Stand by assistance Dynamic Standing Balance Dynamic Standing - Balance  Support: Bilateral upper extremity supported Dynamic Standing - Level of Assistance: 5: Stand by assistance Extremity/Trunk Assessment RUE Assessment RUE Assessment: Within Functional Limits LUE Assessment Active Range of Motion (AROM) Comments: shoulder, wrist, hand WNL; elbow 100/120 General Strength Comments: 5/5   Daneen Schick Breslin Burklow 06/23/2020, 12:24 PM

## 2020-06-23 NOTE — Plan of Care (Signed)
  Problem: Consults Goal: RH LIMB LOSS PATIENT EDUCATION Description: Description: See Patient Education module for eduction specifics. Outcome: Progressing

## 2020-06-23 NOTE — Progress Notes (Incomplete)
Physical Therapy Discharge Summary  Patient Details  Name: Steven [name] MRN: [id number] Date of Birth: 08/30/1955  Today's Date: 06/23/2020 PT Individual Time: 0802-0914 PT Individual Time Calculation (min): 72 min    Patient has met 10 of 11 long term goals due to improved activity tolerance, improved balance and increased strength.  Patient to discharge at a wheelchair level Supervision.   [name] care partner is independent to provide the necessary physical assistance at discharge.  Reasons goals not met: NA  Recommendation:  Patient will benefit from ongoing skilled PT services in outpatient setting to continue to advance safe functional mobility, address ongoing impairments in strength, balance, transfers, ambulation, and minimize fall risk.  Equipment: 20" x 18" Manual WC  Reasons for discharge: treatment goals met and discharge from hospital  Patient/family agrees with progress made and goals achieved: Yes   Skilled Therapeutic Interventions: Pt received supine in bed and agrees to therapy. Reports some soreness in posterior aspect of L residual limb. Number not provided. PT provides mobility and repositioning to address pain. Pt performs bed mobility independently. Squat pivot transfer from bed to [facility] with supervision and cues for positioning. Pt self propels WC x100' to therapy gym. Stand pivot transfers from [facility] to car with RW and supervision for safety.   Pt ambulates x10', and x15' with RW and close supervision. PT cues for upright gaze to improve posture and balance. Pt requires extended seated rest break between bouts of ambulation.   Pt self propels WC x200' independently, performing tight and wide turns, propelling WC backward, and maneuvering through narrow doorways.  Pt left sated in [facility] with all needs within reach.  PT Discharge Precautions/Restrictions Precautions Precautions: Fall Precaution Comments: L BKA  Restrictions Weight Bearing Restrictions:  Yes LLE Weight Bearing: Non weight bearing Vision/Perception  Perception Perception: Within Functional Limits Praxis Praxis: Intact  Cognition Overall Cognitive Status: Within Functional Limits for tasks assessed Arousal/Alertness: Awake/alert Orientation Level: Oriented X4 Safety/Judgment: Appears intact Sensation Sensation Light Touch: Appears Intact Hot/Cold: Appears Intact Coordination Gross Motor Movements are Fluid and Coordinated: Yes Fine Motor Movements are Fluid and Coordinated: Yes Motor  Motor Motor: Within Functional Limits  Mobility Bed Mobility Bed Mobility: Supine to Sit;Sit to Supine Supine to Sit: Independent Sit to Supine: Independent Transfers Transfers: Sit to Stand;Stand to Sit;Stand Pivot Transfers;Squat Pivot Transfers Sit to Stand: Independent with assistive device Stand to Sit: Independent with assistive device Stand Pivot Transfers: Supervision/Verbal cueing Stand Pivot Transfer Details: Verbal cues for precautions/safety;Verbal cues for sequencing Squat Pivot Transfers: Supervision/Verbal cueing Transfer (Assistive device): Rolling walker Locomotion  Gait Ambulation: Yes Gait Assistance: Supervision/Verbal cueing Gait Distance (Feet): 15 Feet Assistive device: Rolling walker Gait Assistance Details: Verbal cues for sequencing;Tactile cues for posture;Verbal cues for safe use of DME/AE;Verbal cues for technique Gait Gait: Yes Gait Pattern: Impaired (improved hop length from eval) Gait velocity: Decreased Stairs / Additional Locomotion Stairs: No Wheelchair Mobility Wheelchair Mobility: Yes Wheelchair Assistance: Independent with Camera operator: Both upper extremities Wheelchair Parts Management: Supervision/cueing Distance: 200'  Trunk/Postural Assessment  Cervical Assessment Cervical Assessment:  (forward head) Thoracic Assessment Thoracic Assessment:  (rounded shoulders) Lumbar Assessment Lumbar  Assessment:  (posterior pelvic tilt) Postural Control Postural Control: Within Functional Limits  Balance Balance Balance Assessed: Yes Static Sitting Balance Static Sitting - Level of Assistance: 7: Independent Dynamic Sitting Balance Dynamic Sitting - Balance Support: During functional activity Dynamic Sitting - Level of Assistance: 7: Independent Static Standing Balance Static Standing - Balance Support: Bilateral upper  extremity supported Static Standing - Level of Assistance: 5: Stand by assistance Dynamic Standing Balance Dynamic Standing - Balance Support: Bilateral upper extremity supported Dynamic Standing - Level of Assistance: 5: Stand by assistance Extremity Assessment  RUE Assessment RUE Assessment: Within Functional Limits LUE Assessment Active Range of Motion (AROM) Comments: shoulder, wrist, hand WNL; elbow 100/120 General Strength Comments: 5/5 RLE Assessment RLE Assessment: Within Functional Limits LLE Assessment General Strength Comments: Hip Flexion 4/5, Knee extension 5/5    [name], PT, DPT 06/23/2020, 12:19 PM

## 2020-06-23 NOTE — Progress Notes (Signed)
[place] PHYSICAL MEDICINE & REHABILITATION PROGRESS NOTE   Subjective/Complaints:  No new problems over night. Excited about getting home tomorrow!  ROS: Patient denies fever, rash, sore throat, blurred vision, nausea, vomiting, diarrhea, cough, shortness of breath or chest pain,  headache, or mood change.    Objective:   No results found. Recent Labs    06/22/20 0523  WBC 2.7*  HGB 8.4*  HCT 27.4*  PLT 138*   Recent Labs    06/22/20 0523  NA 141  K 4.3  CL 107  CO2 23  GLUCOSE 155*  BUN 30*  CREATININE 1.96*  CALCIUM 8.5*    Intake/Output Summary (Last 24 hours) at 06/23/2020 1058 Last data filed at 06/23/2020 1010 Gross per 24 hour  Intake 600 ml  Output 1175 ml  Net -575 ml     Pressure Injury 06/08/20 Coccyx Stage 1 -  Intact skin with non-blanchable redness of a localized area usually over a bony prominence. (Active)  06/08/20 1500  Location: Coccyx  Location Orientation:   Staging: Stage 1 -  Intact skin with non-blanchable redness of a localized area usually over a bony prominence.  Wound Description (Comments):   Present on Admission: Yes (present upon transfer)    Physical Exam: Vital Signs Blood pressure 133/68, pulse 67, temperature 97.9 F (36.6 C), temperature source Oral, resp. rate 18, height 5\' 11"  (1.803 m), weight 106.1 kg, SpO2 98 %.   Constitutional: No distress . Vital signs reviewed. HEENT: EOMI, oral membranes moist Neck: supple Cardiovascular: RRR without murmur. No JVD    Respiratory/Chest: CTA Bilaterally without wheezes or rales. Normal effort    GI/Abdomen: BS +, non-tender, non-distended Ext: no clubbing, cyanosis, or edema Psych: pleasant and cooperative Skin: Sacral pressure injury- stage 1--minimal at best. Right heel wound with eschar which is almost free from granulated base. Left BK incision well approximated with minimal serosang drainage and scab.  Vascular changes RLE persistent Neuro: Pt is cognitively  appropriate with normal insight, memory, and awareness. Cranial nerves 2-12 are intact. Sensory exam is normal. Reflexes are 2+ in all 4's. Motor 5/5.  Musculoskeletal: normal left knee ROM. Stump with decreased edema   Assessment/Plan: 1. Functional deficits which require 3+ hours per day of interdisciplinary therapy in a comprehensive inpatient rehab setting.  Physiatrist is providing close team supervision and 24 hour management of active medical problems listed below.  Physiatrist and rehab team continue to assess barriers to discharge/monitor patient progress toward functional and medical goals  Care Tool:  Bathing    Body parts bathed by patient: Right arm, Left arm, Chest, Abdomen, Front perineal area, Buttocks, Right upper leg, Left upper leg, Right lower leg, Face     Body parts n/a: Left lower leg   Bathing assist Assist Level: Supervision/Verbal cueing     Upper Body Dressing/Undressing Upper body dressing   What is the patient wearing?: Pull over shirt    Upper body assist Assist Level: Supervision/Verbal cueing    Lower Body Dressing/Undressing Lower body dressing      What is the patient wearing?: Underwear/pull up, Pants     Lower body assist Assist for lower body dressing: Contact Guard/Touching assist     Toileting Toileting    Toileting assist Assist for toileting: Supervision/Verbal cueing Assistive Device Comment: urinal   Transfers Chair/bed transfer  Transfers assist     Chair/bed transfer assist level: Supervision/Verbal cueing     Locomotion Ambulation   Ambulation assist      Assist  level: Contact Guard/Touching assist Assistive device: Walker-rolling Max distance: 5 ft   Walk 10 feet activity   Assist  Walk 10 feet activity did not occur: Safety/medical concerns        Walk 50 feet activity   Assist Walk 50 feet with 2 turns activity did not occur: Safety/medical concerns         Walk 150 feet  activity   Assist Walk 150 feet activity did not occur: Safety/medical concerns         Walk 10 feet on uneven surface  activity   Assist Walk 10 feet on uneven surfaces activity did not occur: Safety/medical concerns         Wheelchair     Assist Will patient use wheelchair at discharge?: Yes Type of Wheelchair: Manual Wheelchair activity did not occur: Safety/medical concerns  Wheelchair assist level: Supervision/Verbal cueing Max wheelchair distance: 150'    Wheelchair 50 feet with 2 turns activity    Assist    Wheelchair 50 feet with 2 turns activity did not occur: Safety/medical concerns   Assist Level: Supervision/Verbal cueing   Wheelchair 150 feet activity     Assist  Wheelchair 150 feet activity did not occur: Safety/medical concerns   Assist Level: Supervision/Verbal cueing   Blood pressure 133/68, pulse 67, temperature 97.9 F (36.6 C), temperature source Oral, resp. rate 18, height 5\' 11"  (1.803 m), weight 106.1 kg, SpO2 98 %. Medical Problem List and Plan: 1. Deficits with mobility, transfers, endurance, self-care secondary to left BKA secondary to osteomyelitis. Excellent ROM Left knee              -patient may shower with stump covered             -ELOS/Goals: 06/24/20 supervision/mod I             --Continue CIR therapies including PT, OT 2.  Antithrombotics: -DVT/anticoagulation:  Pharmaceutical: Lovenox             -antiplatelet therapy: N/A 3. Pain Management:             11/23 T#3 effective for pain 4. Mood: pt is upbeat and optimistic              -antipsychotic agents: N/A 5. Neuropsych: This patient is capable of making decisions on his own behalf. 6. Skin/Wound Care:  -right heel ulcer: foam dressing, PRAFO,  Area healing well  -left BKA-telfa, kerlix, shrinker---stable  -sacrum pressure relief 7. Fluids/Electrolytes/Nutrition: Monitor intake/output.  Heart healthy diet.             encourage appropriate  PO  Electrolytes are stable 11/15 8.  T2DM: Monitor BS ac/hs. Continue Glyburide to 10mg  BID.   11/18: hypoglycemic to 50s: decreased lunch and supper coverage to 3U and keep breakfast coverage at 6U. D/c'ed HS detemir CBG (last 3)  Recent Labs    06/22/20 1635 06/22/20 2057 06/23/20 0611  GLUCAP 141* 108* 136*  11/22 increased levemir to 12u QAM  11/23 improved cbg's today 9. Acute on chronic anemia:              Hb 10-->8.5 on 11/9--> 8.1-->8.7 10. Thrombocytopenia             platelets 131k 11/11 11. Renal transplant/CKD IIIb:             Followed by Dr. [name].              BUN/Cr 37/1.95--->30/1.96 11/22  -Aspirin restarted as cleared by nephrology 12.  Aortic stenosis/fluid overload: Continue Lasix. Monitor weights and for signs of overload.   Danley Danker Weights   06/21/20 0400 06/22/20 0454 06/23/20 0500  Weight: 108 kg 108.8 kg 106.1 kg    13. Left elbow tendinitis/bursitis: Ice, compression, pressure relief. 14. Constipation  11/15: had BM with sorbitol on 11/14          LOS: 13 days A FACE TO [place] 06/23/2020, 10:58 AM

## 2020-06-23 NOTE — Discharge Instructions (Signed)
Inpatient Rehab Discharge Instructions  Steven [name] Discharge date and time: 06/24/20   Activities/Precautions/ Functional Status: Activity: no lifting, driving, or strenuous exercise till cleared by MD Diet: diabetic diet Wound Care: Wash with antibacterial soap and water. Pat dry and apply dry dressing. Keep dry dressing on right foot ulcer.    Functional status:  ___ No restrictions     ___ Walk up steps independently ___ 24/7 supervision/assistance   ___ Walk up steps with assistance _X__ Intermittent supervision/assistance  ___ Bathe/dress independently ___ Walk with walker     ___ Bathe/dress with assistance ___ Walk Independently    ___ Shower independently ___ Walk with assistance    _X__ Shower with assistance _X__ No alcohol     ___ Return to work/school ________  Special Instructions: 1. Check blood sugars before meals and at bedtime. Follow up with PCP for adjustment of insulin.    My questions have been answered and I understand these instructions. I will adhere to these goals and the provided educational materials after my discharge from the hospital.  Patient/Caregiver Signature _______________________________ Date __________  Clinician Signature _______________________________________ Date __________  Please bring this form and your medication list with you to all your follow-up doctor's appointments.

## 2020-06-23 NOTE — Progress Notes (Signed)
Patient ID: Steven [name], male   DOB: [birth year], 64 y.o.   MRN: [id number]  SW met with pt in room to provide updates from team conference, gains made, and pt remaining on target for d/c tomorrow. SW confirms DME delivered to room- w/c and DABSC. Pt states he was informed they were out of stock for TTB, and item will be shipped to home. W/c in room is a 24x18. SW also updated pt on challenges with obtaining HH due to staffing and insurance. Pt aware SW will continue to explore [facility] options to see if able to obtain. SW asked PT to inform if pt is able to maneuver this size. *SW received updates from PT pt will need 20x18 due to home set up. SW spoke with [facility] about w/c issue. Reports will swap out current w/c for 20x18 when it is in stock.   06/24/2020- SW updated pt on above about w/c. Sw encouraged pt to continue to follow-up with [employer] about getting correct w/c.   Loralee Pacas, MSW, [place] Office: [phone] Cell: [phone] Fax: [phone]

## 2020-06-23 NOTE — Progress Notes (Signed)
Physical Therapy Session Note  Patient Details  Name: Steven [name] MRN: [id number] Date of Birth: 06/15/1956  Today's Date: 06/23/2020 PT Individual Time: 2130-8657 PT Individual Time Calculation (min): 68 min   Short Term Goals: Week 2:  PT Short Term Goal 1 (Week 2): STG = LTG due to ELOS  Skilled Therapeutic Interventions/Progress Updates:    Pt received sitting in w/c, agreeable to PT session, no reports of pain. Pt's DME had been delivered to room, including 3-1 BSC and a 24x18 w/c. (ordered a 22x18 but due to supply chain issues, 22x18 unavailable, according to CSW). Focus of session to review home safety training, w/c management, and general strengthening. Performed squat<>pivot transfer with supervision from w/c to 24x18 w/c. W/c fitting assessed and width too large with >3 inch of space b/w hips and arm rests. Pt reports door width at home is 30inches and 24x18 w/c with 33inch width from rim-to-rim. Pt propelled himself mod I in w/c from his room down to main hospital lobby, 2x>554ft. Able to navigate w/c turns and elevators mod I. He did require a few brief seated rest breaks 2/2 fatigue. Returned to his room and PT retrieved a 20x18 w/c to assess fitting as this w/c is ~27inches in width and therefore would fit in his doorway. Performed another squat<>pivot w/c from 24x18 w/'c to 20x18 w/c and pt has ~1inch of space b/w hips and armrests, adequate for pressure relief. Informed CSW of need for change in w/c to accomodates needs, she verbalized understanding. Performed seated there-ex in accordance to his HEP that was provided, including bicep curls, PNF D1 flex/ext, and abd/add with green theraband. Pt voiced understanding of all precautions and home safety awareness and reports readiness. He remained seated in 24x18 w/c with needs in reach, chair alarm on.  Therapy Documentation Precautions:  Precautions Precautions: Fall Precaution Comments: L BKA  Required Braces or Orthoses:  Other Brace Other Brace: limb protector Restrictions Weight Bearing Restrictions: Yes LLE Weight Bearing: Non weight bearing  Therapy/Group: Individual Therapy  Steven [name] PT 06/23/2020, 3:27 PM

## 2020-06-23 NOTE — Discharge Summary (Signed)
Physician Discharge Summary  Patient ID: Steven [name] MRN: [id number] DOB/AGE: 62-Feb-1957 62 y.o.  Admit date: 06/10/2020 Discharge date: 06/23/2020  Discharge Diagnoses:  Principal Problem:   S/P BKA (below knee amputation) unilateral, left (HCC) Active Problems:   Diabetes mellitus ([place])   Kidney transplant recipient   Diabetic polyneuropathy associated with type 2 diabetes mellitus (HCC)   Thrombocytopenia (HCC)   Acute blood loss anemia   Discharged Condition: Stable  Significant Diagnostic Studies: N/A   Labs:  Basic Metabolic Panel: BMP Latest Ref Rng & Units 06/22/2020 06/15/2020 06/11/2020  Glucose 70 - 99 mg/dL 155(H) 120(H) 78  BUN 8 - 23 mg/dL 30(H) 38(H) 37(H)  Creatinine 0.61 - 1.24 mg/dL 1.96(H) 1.96(H) 1.95(H)  Sodium 135 - 145 mmol/L 141 138 137  Potassium 3.5 - 5.1 mmol/L 4.3 4.5 4.0  Chloride 98 - 111 mmol/L 107 106 106  CO2 22 - 32 mmol/L 23 23 19(L)  Calcium 8.9 - 10.3 mg/dL 8.5(L) 8.6(L) 8.3(L)    CBC: CBC Latest Ref Rng & Units 06/22/2020 06/15/2020 06/11/2020  WBC 4.0 - 10.5 K/uL 2.7(L) 3.7(L) 4.0  Hemoglobin 13.0 - 17.0 g/dL 8.4(L) 8.7(L) 8.1(L)  Hematocrit 39 - 52 % 27.4(L) 28.3(L) 26.0(L)  Platelets 150 - 400 K/uL 138(L) 157 131(L)    CBG: Recent Labs  Lab 06/22/20 1131 06/22/20 1635 06/22/20 2057 06/23/20 0611 06/23/20 1153  GLUCAP 207* 141* 108* 136* 101*    Brief HPI:   Steven [name] is a 64 y.o. male with history of T2DM, OSA, HTN, renal transplant who was admitted on 06/01/2020 with worsening of left foot ulcer despite conservative management.  He was found to have osteomyelitis with abscess and underwent left BKA on 11/03 by Dr. Sharol Given.  Postop had issues with hypoxia as well as dyspnea with chest pain due to pulmonary edema.  He was treated with IV diuresis and 2D echo showed EF of 60 to 65% with severe aortic stenosis.    Dr. Burt Knack was consulted for input on severe AS with question of TVAR.  Cardiac cath done  revealing EF 30 to 40% with mid LAD stenosis and moderate to severely elevated left and right heart and PA pressures.  Rest of the TVAR work to be done on outpatient basis.  Acute on chronic renal failure was improving and a BLA was stable.  Platelets are noted to be trending downward.  Therapy was ongoing and patient was limited by left elbow pain, weakness as well as shortness of breath with activity.  CIR was recommended due to functional decline.   Hospital Course: Mayank Teuscher was admitted to rehab 06/10/2020 for inpatient therapies to consist of PT and OT at least three hours five days a week. Past admission physiatrist, therapy team and rehab RN have worked together to provide customized collaborative inpatient rehab. He reported left elbow pain due to tendinitis/bursitis which has improved with use of ice, compression and pressure-relief measures.  Serial check of CBC showed H&H to be relatively stable.  Thrombocytopenia has been monitored and platelets currently in 130-140 range without signs of bleeding.  Most recent check of CBC shows neutropenia and recommend repeat labs in 1 to 2 weeks to monitor for recovery/debility.  Serial check of electrolytes shows BUN has improved and serum creatinine currently in 1.96 range.  He has had drop in white count to 2.7.   Blood pressures were monitored on TID basis and has been relatively stable. His p.o. intake has been good and he is  continent of bowel and bladder.  His diabetes has been monitored with ac/hs CBG checks and SSI was use prn for tighter BS control.  Levemir was decreased to 10 units a day due to recurrent hypoglycemic episodes.  He continues on glyburide 5 mg twice daily and is to use sliding scale insulin per home regimen at discharge.  Pain control has improved and is currently managed with as needed use of Tylenol # 3.  Left BKA incision continues to have minimal to moderate serosanguineous drainage and dressing changes to continue in  1-2 times a day to keep wound dry and avoid maceration.  His endurance levels have improved and is currently supervision activity level.  He will continue to receive follow-up home health PT, OT and RN by [facility] home health after discharge.   Rehab course: During patient's stay in rehab weekly team conferences were held to monitor patient's progress, set goals and discuss barriers to discharge. At admission, patient required mod assist with mobility and min to mod assist with ADL tasks. He  has had improvement in activity tolerance, balance, postural control as well as ability to compensate for deficits.  He is able to complete ADL tasks with supervision.  He requires supervision for transfers and is able to propel his wheelchair for 500 feet at modified independent level.  He is able to ambulate 10 to 15 feet with rolling walker and close supervision.  Family education was completed regarding all aspects of safety and care.   Disposition: Home  Diet: Heart healthy/carb modified  Special Instructions: 1.  Recommend repeat CBC in 7-10 days to monitor WBC and platelets. 2.  Repeat BMET in couple of weeks to monitor serum creatinine. 3.  Wash incision with soap and water, cover with Adaptic and dry compressive dressing.   Discharge Instructions    Ambulatory referral to Orthopedic Surgery   Complete by: As directed    Needs post op appt next week. S/p L-BKA   Ambulatory referral to Physical Medicine Rehab   Complete by: As directed    1-2 weeks TC appt     Allergies as of 06/24/2020      Reactions   Sulfa Antibiotics Rash      Medication List    STOP taking these medications   acetaminophen 500 MG tablet Commonly known as: TYLENOL   oxyCODONE 5 MG immediate release tablet Commonly known as: Oxy IR/ROXICODONE   Vitamin D (Ergocalciferol) 1.25 MG (50000 UNIT) Caps capsule Commonly known as: DRISDOL     TAKE these medications   acetaminophen-codeine 300-30 MG tablet--Rx#28  pills Commonly known as: TYLENOL #3 Take 1 tablet by mouth every 6 (six) hours as needed for severe pain.   aspirin 81 MG EC tablet Take 1 tablet (81 mg total) by mouth daily. Swallow whole.   atorvastatin 10 MG tablet Commonly known as: LIPITOR Take 1 tablet (10 mg total) by mouth daily.   BD Pen Needle Nano 2nd Gen 32G X 4 MM Misc Generic drug: Insulin Pen Needle Use daily for glucose control   Insulin Pen Needle 32G X 4 MM Misc Use as directed with insulin pen   cyclobenzaprine 5 MG tablet Commonly known as: FLEXERIL Take 1 tablet (5 mg total) by mouth 3 (three) times daily as needed for muscle spasms.   docusate sodium 100 MG capsule Commonly known as: COLACE Take 1 capsule (100 mg total) by mouth 2 (two) times daily.   fenofibrate 48 MG tablet Commonly known as: TRICOR Take 1  tablet (48 mg total) by mouth daily.   furosemide 20 MG tablet Commonly known as: LASIX Take 1 tablet (20 mg total) by mouth daily.   glyBURIDE 5 MG tablet Commonly known as: DIABETA Take 1 tablet (5 mg total) by mouth 2 (two) times daily with a meal.   insulin aspart 100 UNIT/ML injection Commonly known as: novoLOG Inject 0-30 Units into the skin 3 (three) times daily as needed for high blood sugar. Sliding scale   insulin detemir 100 UNIT/ML FlexPen Commonly known as: LEVEMIR Inject 10 Units into the skin daily.   mycophenolate 360 MG Tbec EC tablet Commonly known as: MYFORTIC Take 1 tablet (360 mg total) by mouth 2 (two) times daily.   omeprazole 20 MG capsule Commonly known as: PRILOSEC Take 1 capsule (20 mg total) by mouth daily.   OneTouch Verio test strip Generic drug: glucose blood 1 each by Other route 3 (three) times daily. Notes to patient: Check blood  Sugars 3-4 times a day till follow up with PCP   polyethylene glycol 17 g packet Commonly known as: MIRALAX / GLYCOLAX Take 17 g by mouth daily as needed for mild constipation.   tacrolimus 1 MG capsule Commonly  known as: PROGRAF Take 1 capsule (1 mg total) by mouth 2 (two) times daily.   traZODone 50 MG tablet Commonly known as: DESYREL Take 1 tablet (50 mg total) by mouth at bedtime.       Follow-up Information    Meredith Staggers, MD Follow up.   Specialty: Physical Medicine and Rehabilitation Why: office will call you with follow up appointment Contact information: [place] [address] [phone]        Isaac Bliss, Rayford Halsted, MD. Call on 06/29/2020.   Specialty: Internal Medicine Why: for post hospital follow up Contact information: [place]  [address] [phone]        Sherren Mocha, MD. Call on 06/29/2020.   Specialty: Cardiology Why: TVAR follow up Contact information: [address] [place] [address] [phone]        Corliss Parish, MD. Call.   Specialty: Nephrology Why: for routine check Contact information: [place] [address] [phone]        Newt Minion, MD Follow up.   Specialty: Orthopedic Surgery Why: for post op check in one week. Contact information: [address] [place] [address] [phone]               Signed: Bary Leriche 06/28/2020, 10:39 PM

## 2020-06-23 NOTE — Patient Care Conference (Signed)
Inpatient RehabilitationTeam Conference and Plan of Care Update Date: 06/23/2020   Time: 10:53 AM   Patient Name: Steven [name]      Medical Record Number: [id number]  Date of Birth: 10/14/1955 Sex: Male         Room/Bed: 4M03C/4M03C-01 Payor Info: Payor: [place] / Plan: [place] PPO / Product Type: *No Product type* /    Admit Date/Time:  06/10/2020  3:48 PM  Primary Diagnosis:  S/P BKA (below knee amputation) unilateral, left [facility])  Hospital Problems: Principal Problem:   S/P BKA (below knee amputation) unilateral, left ([place]) Active Problems:   Below-knee amputation of left lower extremity [facility])    Expected Discharge Date: Expected Discharge Date: 06/24/20  Team Members Present: Physician leading conference: Dr. Alger Simons Care Coodinator Present: Erlene Quan, BSW;Dorean Daniello Creig Hines, RN, BSN, [place] Nurse Present: Other (comment) Shayne Alken, RN) PT Present: Tereasa Coop, PT OT Present: Cherylynn Ridges, OT PPS Coordinator present : Ileana Ladd, Burna Mortimer, SLP     Current Status/Progress Goal Weekly Team Focus  Bowel/Bladder   continent of bowel and bladder, LBM 11/21  maintain bowel regimen  prevent constipation and encourage movement   Swallow/Nutrition/ Hydration             ADL's   CGA, supervision overall  Supervision/CGA overall  home HEP, self-care retraining, residual limb care, transfers   Mobility             Communication             Safety/Cognition/ Behavioral Observations            Pain   generalized pain after therapy, described as soreness  Become pain free  Treated with tylenol or tylenol #3   Skin   R heel diabetic ulcer and healed L BKA  Wound healing  Maintain wound dressings per orders     Discharge Planning:  D/c to home with intermittent supervision; fam edu completed. DME ordered. Waiting on [facility] for HHPT/OT/SN.   Team Discussion: No pain reported, continent B/B. OT reports patient's  family has completed education. PT reports patient's family has completed education. Both disciplines have given HEP to the patient.  Patient on target to meet rehab goals: yes  *See Care Plan and progress notes for long and short-term goals.   Revisions to Treatment Plan:  No  Teaching Needs: Family education completed.  Current Barriers to Discharge: Patient medically ready for discharge.  Possible Resolutions to Barriers: Patient is ready for discharge.     Medical Summary Current Status: left BKA healing nicely. pain improved. stump shaping nicely also. bp controlled     Possible Resolutions to [employer] Focus: no medical barriers to discharge   Continued Need for Acute Rehabilitation Level of Care: The patient requires daily medical management by a physician with specialized training in physical medicine and rehabilitation for the following reasons: Direction of a multidisciplinary physical rehabilitation program to maximize functional independence : Yes Medical management of patient stability for increased activity during participation in an intensive rehabilitation regime.: Yes Analysis of laboratory values and/or radiology reports with any subsequent need for medication adjustment and/or medical intervention. : Yes   I attest that I was present, lead the team conference, and concur with the assessment and plan of the team.   Cristi Loron 06/23/2020, 2:53 PM

## 2020-06-23 NOTE — Plan of Care (Signed)
  Problem: RH Balance Goal: LTG Patient will maintain dynamic standing with ADLs (OT) Description: LTG:  Patient will maintain dynamic standing balance with assist during activities of daily living (OT)  Outcome: Completed/Met   Problem: Sit to Stand Goal: LTG:  Patient will perform sit to stand in prep for activites of daily living with assistance level (OT) Description: LTG:  Patient will perform sit to stand in prep for activites of daily living with assistance level (OT) Outcome: Completed/Met   Problem: RH Bathing Goal: LTG Patient will bathe all body parts with assist levels (OT) Description: LTG: Patient will bathe all body parts with assist levels (OT) Outcome: Completed/Met   Problem: RH Dressing Goal: LTG Patient will perform lower body dressing w/assist (OT) Description: LTG: Patient will perform lower body dressing with assist, with/without cues in positioning using equipment (OT) Outcome: Completed/Met   Problem: RH Toileting Goal: LTG Patient will perform toileting task (3/3 steps) with assistance level (OT) Description: LTG: Patient will perform toileting task (3/3 steps) with assistance level (OT)  Outcome: Completed/Met   Problem: RH Simple Meal Prep Goal: LTG Patient will perform simple meal prep w/assist (OT) Description: LTG: Patient will perform simple meal prep with assistance, with/without cues (OT). Outcome: Completed/Met   Problem: RH Light Housekeeping Goal: LTG Patient will perform light housekeeping w/assist (OT) Description: LTG: Patient will perform light housekeeping with assistance, with/without cues (OT). Outcome: Completed/Met   Problem: RH Toilet Transfers Goal: LTG Patient will perform toilet transfers w/assist (OT) Description: LTG: Patient will perform toilet transfers with assist, with/without cues using equipment (OT) Outcome: Completed/Met   Problem: RH Tub/Shower Transfers Goal: LTG Patient will perform tub/shower transfers w/assist  (OT) Description: LTG: Patient will perform tub/shower transfers with assist, with/without cues using equipment (OT) Outcome: Completed/Met

## 2020-06-24 DIAGNOSIS — E1169 Type 2 diabetes mellitus with other specified complication: Secondary | ICD-10-CM

## 2020-06-24 DIAGNOSIS — E669 Obesity, unspecified: Secondary | ICD-10-CM

## 2020-06-24 LAB — GLUCOSE, CAPILLARY: Glucose-Capillary: 107 mg/dL — ABNORMAL HIGH (ref 70–99)

## 2020-06-24 NOTE — Progress Notes (Signed)
Patient discharged off of unit with all belongings. Discharge papers/instructions explained by physician assistant to family. Patient and family have no further questions at time of discharge. No complications noted at this time.  Steven [name] L Anaisa Radi  

## 2020-06-24 NOTE — Progress Notes (Signed)
[facility] PHYSICAL MEDICINE & REHABILITATION PROGRESS NOTE   Subjective/Complaints:  Had a great night. Getting packed up to leave. No new issues today  ROS: Patient denies fever, rash, sore throat, blurred vision, nausea, vomiting, diarrhea, cough, shortness of breath or chest pain, back pain, headache, or mood change.   Objective:   No results found. Recent Labs    06/22/20 0523  WBC 2.7*  HGB 8.4*  HCT 27.4*  PLT 138*   Recent Labs    06/22/20 0523  NA 141  K 4.3  CL 107  CO2 23  GLUCOSE 155*  BUN 30*  CREATININE 1.96*  CALCIUM 8.5*    Intake/Output Summary (Last 24 hours) at 06/24/2020 0820 Last data filed at 06/24/2020 0730 Gross per 24 hour  Intake 1060 ml  Output 2175 ml  Net -1115 ml     Pressure Injury 06/08/20 Coccyx Stage 1 -  Intact skin with non-blanchable redness of a localized area usually over a bony prominence. (Active)  06/08/20 1500  Location: Coccyx  Location Orientation:   Staging: Stage 1 -  Intact skin with non-blanchable redness of a localized area usually over a bony prominence.  Wound Description (Comments):   Present on Admission: Yes (present upon transfer)    Physical Exam: Vital Signs Blood pressure 136/64, pulse 66, temperature 98.5 F (36.9 C), temperature source Oral, resp. rate 18, height 5\' 11"  (1.803 m), weight 102.5 kg, SpO2 99 %.   Constitutional: No distress . Vital signs reviewed. HEENT: EOMI, oral membranes moist Neck: supple Cardiovascular: RRR without murmur. No JVD    Respiratory/Chest: CTA Bilaterally without wheezes or rales. Normal effort    GI/Abdomen: BS +, non-tender, non-distended Ext: no clubbing, cyanosis, or edema Psych: pleasant and cooperative Skin: red spot at sacrum. Right heel wound with eschar, granulating in.  Left BK incision well approximated with minimal serosang drainage and scab.  Vascular changes RLE persistent Neuro: Pt is cognitively appropriate with normal insight, memory, and  awareness. Cranial nerves 2-12 are intact. Sensory exam is normal. Reflexes are 2+ in all 4's. Motor 5/5.  Musculoskeletal: full left knee ROM   Assessment/Plan: 1. Functional deficits which require 3+ hours per day of interdisciplinary therapy in a comprehensive inpatient rehab setting.  Physiatrist is providing close team supervision and 24 hour management of active medical problems listed below.  Physiatrist and rehab team continue to assess barriers to discharge/monitor patient progress toward functional and medical goals  Care Tool:  Bathing    Body parts bathed by patient: Right arm, Left arm, Chest, Abdomen, Front perineal area, Buttocks, Right upper leg, Left upper leg, Right lower leg, Face     Body parts n/a: Left lower leg   Bathing assist Assist Level: Supervision/Verbal cueing     Upper Body Dressing/Undressing Upper body dressing   What is the patient wearing?: Pull over shirt    Upper body assist Assist Level: Independent    Lower Body Dressing/Undressing Lower body dressing      What is the patient wearing?: Pants, Underwear/pull up     Lower body assist Assist for lower body dressing: Supervision/Verbal cueing     Toileting Toileting    Toileting assist Assist for toileting: Supervision/Verbal cueing Assistive Device Comment: urinal   Transfers Chair/bed transfer  Transfers assist     Chair/bed transfer assist level: Supervision/Verbal cueing     Locomotion Ambulation   Ambulation assist      Assist level: Supervision/Verbal cueing Assistive device: Walker-rolling Max distance: 29'  Walk 10 feet activity   Assist  Walk 10 feet activity did not occur: Safety/medical concerns  Assist level: Supervision/Verbal cueing Assistive device: Walker-rolling   Walk 50 feet activity   Assist Walk 50 feet with 2 turns activity did not occur: Safety/medical concerns         Walk 150 feet activity   Assist Walk 150 feet activity  did not occur: Safety/medical concerns         Walk 10 feet on uneven surface  activity   Assist Walk 10 feet on uneven surfaces activity did not occur: Safety/medical concerns         Wheelchair     Assist Will patient use wheelchair at discharge?: Yes Type of Wheelchair: Manual Wheelchair activity did not occur: Safety/medical concerns  Wheelchair assist level: Independent Max wheelchair distance: 150'    Wheelchair 50 feet with 2 turns activity    Assist    Wheelchair 50 feet with 2 turns activity did not occur: Safety/medical concerns   Assist Level: Independent   Wheelchair 150 feet activity     Assist  Wheelchair 150 feet activity did not occur: Safety/medical concerns   Assist Level: Independent   Blood pressure 136/64, pulse 66, temperature 98.5 F (36.9 C), temperature source Oral, resp. rate 18, height 5\' 11"  (1.803 m), weight 102.5 kg, SpO2 99 %. Medical Problem List and Plan: 1. Deficits with mobility, transfers, endurance, self-care secondary to left BKA secondary to osteomyelitis. Excellent ROM Left knee              -dc home today  -Patient to see MD in the office for transitional care encounter in 1-2 weeks.  -surgical f/u as advised               2.  Antithrombotics: -DVT/anticoagulation:  Pharmaceutical: Lovenox             -antiplatelet therapy: N/A 3. Pain Management:             11/24 T#3 effective for pain 4. Mood: pt is upbeat and optimistic              -antipsychotic agents: N/A 5. Neuropsych: This patient is capable of making decisions on his own behalf. 6. Skin/Wound Care:  -right heel ulcer: foam dressing, PRAFO,  Area healing well  -left BKA-telfa, kerlix, shrinker---continues to heal.  -sacrum pressure relief 7. Fluids/Electrolytes/Nutrition: eating well    8.  T2DM: Monitor BS ac/hs. Continue Glyburide to 10mg  BID.   11/18: hypoglycemic to 50s: decreased lunch and supper coverage to 3U and keep breakfast coverage  at 6U. D/c'ed HS detemir CBG (last 3)  Recent Labs    06/23/20 1648 06/23/20 2109 06/24/20 0603  GLUCAP 114* 174* 107*  11/22 increased levemir to 12u QAM  11/23-24 improved cbg's --continue same regimen 9. Acute on chronic anemia:              Hb 10-->8.5 on 11/9--> 8.1-->8.7 10. Thrombocytopenia             platelets 131k 11/11 11. Renal transplant/CKD IIIb:             Followed by Dr. [name].              BUN/Cr 37/1.95--->30/1.96 11/22  -Aspirin restarted as cleared by nephrology 12. Aortic stenosis/fluid overload: Continue Lasix. Monitor weights and for signs of overload.   -weights inconsistent I/O's negative since admit  Filed Weights   06/22/20 0454 06/23/20 0500 06/24/20 0410  Weight:  108.8 kg 106.1 kg 102.5 kg    13. Left elbow tendinitis/bursitis: Ice, compression, pressure relief. 14. Constipation  Moving bowels         LOS: 14 days A FACE TO FACE EVALUATION WAS PERFORMED  Meredith Staggers 06/24/2020, 8:20 AM

## 2020-06-24 NOTE — Plan of Care (Signed)
  Problem: Consults Goal: RH LIMB LOSS PATIENT EDUCATION Description: Description: See Patient Education module for eduction specifics. Outcome: Completed/Met   Problem: RH SKIN INTEGRITY Goal: RH STG SKIN FREE OF INFECTION/BREAKDOWN Description: Mod I Outcome: Completed/Met Goal: RH STG MAINTAIN SKIN INTEGRITY WITH ASSISTANCE Description: STG Maintain Skin Integrity With Mod I Assistance. Outcome: Completed/Met   Problem: RH SAFETY Goal: RH STG ADHERE TO SAFETY PRECAUTIONS W/ASSISTANCE/DEVICE Description: STG Adhere to Safety Precautions With Mod I Assistance/Device. Outcome: Completed/Met Goal: RH STG DECREASED RISK OF FALL WITH ASSISTANCE Description: STG Decreased Risk of Fall With Mod I Assistance. Outcome: Completed/Met   Problem: RH PAIN MANAGEMENT Goal: RH STG PAIN MANAGED AT OR BELOW PT'S PAIN GOAL Description: <2 Outcome: Completed/Met   Problem: RH KNOWLEDGE DEFICIT LIMB LOSS Goal: RH STG INCREASE KNOWLEDGE OF SELF CARE AFTER LIMB LOSS Description: Patient will be able to demonstrate knowledge of residual limb protection, incision site care, wound care and dressing changes, diabetes management, medication management, non-weight bearing precautions, and follow up care with the MD post discharge with min assist from CIR staff. Outcome: Completed/Met

## 2020-06-24 NOTE — Progress Notes (Signed)
Inpatient Rehabilitation Care Coordinator  Discharge Note  The overall goal for the admission was met for:   Discharge location: Yes. D/c to home with support from family.   Length of Stay: Yes. 13 days.   Discharge activity level: Yes. Mod I.   Home/community participation: Yes. Limited.   Services provided included: MD, RD, PT, OT, RN, CM, TR, Pharmacy, Neuropsych and SW  Financial Services: Private Insurance: [employer]  Follow-up services arranged: Home Health: NO HHA due to staffing and insurance. SW will continue to explore this option for patient.  and DME: [employer] for w/c, DABSC, and TTB (to be shipped to home)  Comments (or additional information): contact pt [phone]  Patient/Family verbalized understanding of follow-up arrangements: Yes  Individual responsible for coordination of the follow-up plan: Pt to have assistnace  Confirmed correct DME delivered: Rana Snare 06/24/2020    Loralee Pacas, MSW, [place] Office: [phone] Cell: [phone] Fax: [phone]

## 2020-06-26 ENCOUNTER — Telehealth: Payer: Self-pay

## 2020-06-26 NOTE — Telephone Encounter (Signed)
SW received updates from [facility] able to accept HHPT/OT/SN referral. Reports pt will begin with PT and will have follow-up next week.   SW called pt to update on above. SW sent pt text with contact information.

## 2020-06-29 ENCOUNTER — Telehealth: Payer: Self-pay | Admitting: Cardiovascular Disease

## 2020-06-29 ENCOUNTER — Telehealth: Payer: Self-pay

## 2020-06-29 NOTE — Telephone Encounter (Signed)
° ° °  Pt said he was told to call us to schedule appt with Dr. Burt Knack when he get home from hospital. Offered APP but he said he was told by Dr. Burt Knack to have f/u appt with him and it should be sooner than April. Please help

## 2020-06-29 NOTE — Telephone Encounter (Signed)
Transitional Care Call--who you spoke with Ramone B. Capote   1. Are you/is patient experiencing any problems since coming home?None.  Are there any questions regarding any aspect of care? No.  2. Are there any questions regarding medications administration/dosing? Yes.  Are meds being taken as prescribed?Yes.  Patient should review meds with caller to confirm. Medication list reviewed. Have there been any falls? None.  3. Has Home Health been to the house and/or have they contacted you? Yes.  If not, have you tried to contact them. Already visited.  Can we help you contact them? Wheelchair too large & shower bench has not arrived. Per patient he was told there are not any in stock. 4. Are bowels and bladder emptying properly? Yes. Are there any unexpected incontinence issues? No  If applicable, is patient following bowel/bladder programs? Yes.  5. Any fevers, problems with breathing, unexpected pain? None 6. Are there any skin problems or new areas of breakdown? No. Has the patient/family member arranged specialty MD follow up (ie cardiology/neurology/renal/surgical/etc)? Contact list reviewed. Patient has some appointments.  Can we help arrange? No.  7. Does the patient need any other services or support that we can help arrange? No.  8. Are caregivers following through as expected in assisting the patient? Yes.        11. Has the patient quit smoking, drinking alcohol, or using drugs as recommended? No such activity.   Appointment Date/Time/ Arrival time/ and who they are seeing [place]

## 2020-06-30 NOTE — Telephone Encounter (Signed)
The patient has completed inpatient rehab/PT and feels ready to have outpatient consult. Scheduled him for visit with Dr. Burt Knack to discuss TAVR next Wednesday, December 8. The patient was grateful for call and agrees with plan.

## 2020-06-30 NOTE — Telephone Encounter (Signed)
Left message to call back  

## 2020-06-30 NOTE — Telephone Encounter (Signed)
Pt called back returning katie's call.   Best number [phone]

## 2020-07-01 ENCOUNTER — Ambulatory Visit (INDEPENDENT_AMBULATORY_CARE_PROVIDER_SITE_OTHER): Payer: BC Managed Care – PPO | Admitting: Physician Assistant

## 2020-07-01 ENCOUNTER — Other Ambulatory Visit: Payer: Self-pay

## 2020-07-01 ENCOUNTER — Encounter (HOSPITAL_BASED_OUTPATIENT_CLINIC_OR_DEPARTMENT_OTHER): Payer: BC Managed Care – PPO | Admitting: Physician Assistant

## 2020-07-01 ENCOUNTER — Encounter: Payer: Self-pay | Admitting: Family

## 2020-07-01 VITALS — Ht 71.0 in | Wt 225.0 lb

## 2020-07-01 DIAGNOSIS — M86072 Acute hematogenous osteomyelitis, left ankle and foot: Secondary | ICD-10-CM

## 2020-07-01 MED ORDER — DOXYCYCLINE HYCLATE 100 MG PO TABS
100.0000 mg | ORAL_TABLET | Freq: Two times a day (BID) | ORAL | 0 refills | Status: DC
Start: 2020-07-01 — End: 2020-09-08

## 2020-07-01 NOTE — Progress Notes (Signed)
Office Visit Note   Patient: Steven [name]           Date of Birth: Jun 20, 1956           MRN: [id number] Visit Date: 07/01/2020              Requested by: Isaac Bliss, Rayford Halsted, MD [place],  [place] [address] PCP: Isaac Bliss, Rayford Halsted, MD  Chief Complaint  Patient presents with  . Left Leg - Routine Post Op    06/03/20 left BKA       HPI: Patient presents today 4 weeks status post left below-knee amputation.  This is his first visit with [place] as he just recently got discharged from inpatient rehab.  Assessment & Plan: Visit Diagnoses: No diagnosis found.  Plan: #1 Place patient on a week of doxycycline because of the erythema on the lateral aspect of the wound.  I have instructed them to apply the shrinker directly to the wound and wrapped a dressing over the top if needed.  He may also cleanse this daily with antibacterial soap and water.  We will remove the remaining staples next week.  I have given them a prescription for 2 to XL shrinkers  Follow-Up Instructions: No follow-ups on file.   Ortho Exam  Patient is alert, oriented, no adenopathy, well-dressed, normal affect, normal respiratory effort. Healing amputation stump.  Medial and midportion are healed with just some eschar.  Lateral aspect has some erythema and mild wound dehiscence.  There is no ascending cellulitis.  No foul odor.  Just bloody drainage.  Swelling is overall well controlled but shrinker is too large  Imaging: No results found.   Labs: Lab Results  Component Value Date   HGBA1C 9.3 (H) 06/01/2020   HGBA1C 9.4 (H) 12/21/2019   HGBA1C 9.3 (H) 07/17/2017   ESRSEDRATE 40 (H) 07/17/2017   CRP 11.1 (H) 07/17/2017   REPTSTATUS 06/06/2020 FINAL 06/01/2020   REPTSTATUS 06/06/2020 FINAL 06/01/2020   CULT  06/01/2020    NO GROWTH 5 DAYS Performed at [facility], [place] [address] [place], [place] [address]    CULT  06/01/2020    NO GROWTH 5 DAYS Performed at  [facility] [address] [place], [place] [address]      Lab Results  Component Value Date   ALBUMIN 2.5 (L) 06/11/2020   ALBUMIN 3.0 (L) 06/03/2020   ALBUMIN 3.3 (L) 06/01/2020   PREALBUMIN 16.1 (L) 07/17/2017    Lab Results  Component Value Date   MG 1.7 06/03/2020   MG 1.5 (L) 07/18/2017   MG 1.5 07/12/2007   Lab Results  Component Value Date   VD25OH 18 (L) 02/21/2020    Lab Results  Component Value Date   PREALBUMIN 16.1 (L) 07/17/2017   CBC EXTENDED Latest Ref Rng & Units 06/22/2020 06/15/2020 06/11/2020  WBC 4.0 - 10.5 K/uL 2.7(L) 3.7(L) 4.0  RBC 4.22 - 5.81 MIL/uL 3.29(L) 3.46(L) 3.21(L)  HGB 13.0 - 17.0 g/dL 8.4(L) 8.7(L) 8.1(L)  HCT 39 - 52 % 27.4(L) 28.3(L) 26.0(L)  PLT 150 - 400 K/uL 138(L) 157 131(L)  NEUTROABS 1.7 - 7.7 K/uL - - 2.9  LYMPHSABS 0.7 - 4.0 K/uL - - 0.5(L)     Body mass index is 31.38 kg/m.  Orders:  No orders of the defined types were placed in this encounter.  Meds ordered this encounter  Medications  . doxycycline (VIBRA-TABS) 100 MG tablet    Sig: Take  1 tablet (100 mg total) by mouth 2 (two) times daily.    Dispense:  60 tablet    Refill:  0     Procedures: No procedures performed  Clinical Data: No additional findings.  ROS:  All other systems negative, except as noted in the HPI. Review of Systems  Objective: Vital Signs: Ht 5\' 11"  (1.803 m)   Wt 225 lb (102.1 kg)   BMI 31.38 kg/m   Specialty Comments:  No specialty comments available.  PMFS History: Patient Active Problem List   Diagnosis Date Noted  . Acute blood loss anemia   . S/P BKA (below knee amputation) unilateral, left ([place])   . Aortic valve stenosis   . Open wound of left foot 06/01/2020  . Osteomyelitis of foot ([place]) 06/01/2020  . CKD (chronic kidney disease), stage III ([place]) 06/01/2020  . Diabetic foot infection ([place])   . Chronic pain 02/28/2020  . Vitamin D deficiency 02/25/2020  . Thrombocytopenia ([place]) 02/25/2020  .  Normocytic anemia 02/25/2020  . Chronic venous hypertension (idiopathic) with ulcer and inflammation of right lower extremity ([place])   . Non-pressure chronic ulcer of right heel and midfoot limited to breakdown of skin ([place])   . Diabetic polyneuropathy associated with type 2 diabetes mellitus ([place])   . Venous insufficiency   . Cellulitis of right foot 12/21/2019  . Obesity, Class III, BMI 40-49.9 (morbid obesity) ([place])   . Osteomyelitis ([place]) 07/17/2017  . Kidney transplant recipient 05/24/2015  . Diabetes mellitus ([place]) 05/10/2012  . Hyperlipidemia 05/10/2012  . Essential hypertension 05/10/2012   Past Medical History:  Diagnosis Date  . Cataract    removed by surgery  . Diabetes ([place])    type 2  . 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 Corliss Parish.  . GERD (gastroesophageal reflux disease)   . HLD (hyperlipidemia)   . Hyperparathyroidism ([place])   . Hypertension    Patient denies this DX, no meds  . Kidney transplant recipient   . Obesity   . Sleep apnea    uses CPAP nightly  . Venous insufficiency   . Vitamin D deficiency     Family History  Problem Relation Age of Onset  . Healthy Mother   . Healthy Father   . Colon cancer Neg Hx   . Rectal cancer Neg Hx   . Stomach cancer Neg Hx     Past Surgical History:  Procedure Laterality Date  . AMPUTATION Right 07/19/2017   Procedure: AMPUTATION RIGHT 2ND TOE;  Surgeon: Leandrew Koyanagi, MD;  Location: [facility];  Service: Orthopedics;  Laterality: Right;  . AMPUTATION Left 06/03/2020   Procedure: LEFT BELOW KNEE AMPUTATION;  Surgeon: Newt Minion, MD;  Location: [facility];  Service: Orthopedics;  Laterality: Left;  . COLONOSCOPY     greater than 10 yrs ago  . EYE SURGERY Bilateral    cataracts removed  . KIDNEY TRANSPLANT  2006  . NEPHRECTOMY TRANSPLANTED ORGAN  2006  . RIGHT HEART CATH AND CORONARY ANGIOGRAPHY N/A 06/08/2020   Procedure: RIGHT HEART CATH AND CORONARY ANGIOGRAPHY;   Surgeon: Nelva Bush, MD;  Location: [facility];  Service: Cardiovascular;  Laterality: N/A;  . UPPER GASTROINTESTINAL ENDOSCOPY    . WISDOM TOOTH EXTRACTION     Social History   Occupational History  . Occupation: trooper  Tobacco Use  . Smoking status: Never Smoker  . Smokeless tobacco: Former Systems developer    Types: Secondary school teacher  .  Vaping Use: Never used  Substance and Sexual Activity  . Alcohol use: No  . Drug use: No  . Sexual activity: Yes

## 2020-07-06 ENCOUNTER — Encounter: Payer: BC Managed Care – PPO | Admitting: Registered Nurse

## 2020-07-08 ENCOUNTER — Other Ambulatory Visit: Payer: Self-pay

## 2020-07-08 ENCOUNTER — Encounter: Payer: Self-pay | Admitting: Family

## 2020-07-08 ENCOUNTER — Ambulatory Visit (INDEPENDENT_AMBULATORY_CARE_PROVIDER_SITE_OTHER): Payer: BC Managed Care – PPO | Admitting: Family

## 2020-07-08 ENCOUNTER — Encounter: Payer: Self-pay | Admitting: Cardiovascular Disease

## 2020-07-08 ENCOUNTER — Ambulatory Visit (INDEPENDENT_AMBULATORY_CARE_PROVIDER_SITE_OTHER): Payer: BC Managed Care – PPO | Admitting: Cardiovascular Disease

## 2020-07-08 VITALS — BP 118/60 | HR 73 | Ht 71.75 in | Wt 235.0 lb

## 2020-07-08 DIAGNOSIS — I350 Nonrheumatic aortic (valve) stenosis: Secondary | ICD-10-CM | POA: Diagnosis not present

## 2020-07-08 DIAGNOSIS — Z89512 Acquired absence of left leg below knee: Secondary | ICD-10-CM

## 2020-07-08 NOTE — Patient Instructions (Addendum)
You will be contacted with your appointments for next week!

## 2020-07-08 NOTE — H&P (View-Only) (Signed)
Cardiology Office Note:    Date:  07/08/2020   ID:  Steven [name], DOB November 13, 1955, MRN [id number]  PCP:  Isaac Bliss, Rayford Halsted, MD  [facility] Cardiologist:  Sherren Mocha, MD  [place] Electrophysiologist:  None   Referring MD: Isaac Bliss, Est[place]*   Chief Complaint  Patient presents with  . Aortic Stenosis    History of Present Illness:    Steven [name] is a 64 y.o. male presenting for follow-up of severe aortic stenosis.  The patient has a complicated medical history with heavy NSAID use and uncontrolled diabetes leading to end-stage renal disease.  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 [name] ever since that time.  He has a longstanding heart murmur.  He was hospitalized in November 2021 with a nonhealing diabetic foot ulcer, failing outpatient antibiotics.  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  He then developed postoperative volume overload with acute diastolic heart failure requiring IV furosemide.  BNP was elevated and chest x-ray demonstrated interstitial edema consistent with congestive heart failure.  An echocardiogram was performed and the patient was found to have very severe aortic stenosis with a mean transvalvular gradient greater than 60 mmHg.  He was seen in formal consultation during his hospital stay on June 05, 2020.  At that time he had improved with IV diuretic therapy and was felt to be stable to continue with his postoperative rehab.  He did undergo right and left heart catheterization and echo studies while he was in the hospital.  His echocardiogram is copied below and showed findings consistent with very severe aortic stenosis and preserved LV systolic function.  His cardiac catheterization demonstrated nonobstructive coronary artery disease with elevated right and left heart filling pressures.  The patient is here today  with his mother to discuss further management of severe aortic stenosis.  The patient reports that he is progressing well.  He has participated in physical therapy.  He is still having some oozing from his amputation site in the left lower leg.  He follows up with Dr. Sharol Given this afternoon.  He has not had any recent chest pain or pressure, shortness of breath, or lightheadedness/syncope.  Past Medical History:  Diagnosis Date  . Cataract    removed by surgery  . Diabetes ([place])    type 2  . Diabetic glomerulosclerosis [facility])    [facility] 03/21/12 by Dr. Corliss Parish.  . GERD (gastroesophageal reflux disease)   . HLD (hyperlipidemia)   . Hyperparathyroidism ([place])   . Hypertension    Patient denies this DX, no meds  . Kidney transplant recipient   . Obesity   . Sleep apnea    uses CPAP nightly  . Venous insufficiency   . Vitamin D deficiency     Past Surgical History:  Procedure Laterality Date  . AMPUTATION Right 07/19/2017   Procedure: AMPUTATION RIGHT 2ND TOE;  Surgeon: Leandrew Koyanagi, MD;  Location: [place];  Service: Orthopedics;  Laterality: Right;  . AMPUTATION Left 06/03/2020   Procedure: LEFT BELOW KNEE AMPUTATION;  Surgeon: Newt Minion, MD;  Location: [place];  Service: Orthopedics;  Laterality: Left;  . COLONOSCOPY     greater than 10 yrs ago  . EYE SURGERY Bilateral    cataracts removed  . KIDNEY TRANSPLANT  2006  . NEPHRECTOMY TRANSPLANTED ORGAN  2006  . RIGHT HEART CATH AND CORONARY ANGIOGRAPHY N/A 06/08/2020  Procedure: RIGHT HEART CATH AND CORONARY ANGIOGRAPHY;  Surgeon: Nelva Bush, MD;  Location: [place] [facility];  Service: Cardiovascular;  Laterality: N/A;  . UPPER GASTROINTESTINAL ENDOSCOPY    . WISDOM TOOTH EXTRACTION      Current Medications: Current Meds  Medication Sig  . acetaminophen-codeine (TYLENOL #3) 300-30 MG tablet Take 1 tablet by mouth every 6 (six) hours as needed for severe pain.  Marland Kitchen aspirin EC 81 MG EC tablet  Take 1 tablet (81 mg total) by mouth daily. Swallow whole.  Marland Kitchen atorvastatin (LIPITOR) 10 MG tablet Take 1 tablet (10 mg total) by mouth daily.  . cyclobenzaprine (FLEXERIL) 5 MG tablet Take 1 tablet (5 mg total) by mouth 3 (three) times daily as needed for muscle spasms.  Marland Kitchen docusate sodium (COLACE) 100 MG capsule Take 1 capsule (100 mg total) by mouth 2 (two) times daily.  Marland Kitchen doxycycline (VIBRA-TABS) 100 MG tablet Take 1 tablet (100 mg total) by mouth 2 (two) times daily.  . fenofibrate (TRICOR) 48 MG tablet Take 1 tablet (48 mg total) by mouth daily.  . furosemide (LASIX) 20 MG tablet Take 1 tablet (20 mg total) by mouth daily.  Marland Kitchen glyBURIDE (DIABETA) 5 MG tablet Take 1 tablet (5 mg total) by mouth 2 (two) times daily with a meal.  . insulin aspart (NOVOLOG) 100 UNIT/ML injection Inject 0-30 Units into the skin 3 (three) times daily as needed for high blood sugar. Sliding scale  . insulin detemir (LEVEMIR) 100 UNIT/ML FlexPen Inject 10 Units into the skin daily.  . Insulin Pen Needle (BD PEN NEEDLE NANO 2ND GEN) 32G X 4 MM MISC Use daily for glucose control  . Insulin Pen Needle 32G X 4 MM MISC Use as directed with insulin pen  . mycophenolate (MYFORTIC) 360 MG TBEC EC tablet Take 1 tablet (360 mg total) by mouth 2 (two) times daily.  Marland Kitchen omeprazole (PRILOSEC) 20 MG capsule Take 1 capsule (20 mg total) by mouth daily.  Glory Rosebush VERIO test strip 1 each by Other route 3 (three) times daily.  . polyethylene glycol (MIRALAX / GLYCOLAX) 17 g packet Take 17 g by mouth daily as needed for mild constipation.  . tacrolimus (PROGRAF) 1 MG capsule Take 1 capsule (1 mg total) by mouth 2 (two) times daily.  . traZODone (DESYREL) 50 MG tablet Take 1 tablet (50 mg total) by mouth at bedtime.     Allergies:   Sulfa antibiotics   Social History   Socioeconomic History  . Marital status: Legally Separated    Spouse name: Not on file  . Number of children: Not on file  . Years of education: Not on file  .  Highest education level: Not on file  Occupational History  . Occupation: trooper  Tobacco Use  . Smoking status: Never Smoker  . Smokeless tobacco: Former Systems developer    Types: Secondary school teacher  . Vaping Use: Never used  Substance and Sexual Activity  . Alcohol use: No  . Drug use: No  . Sexual activity: Yes  Other Topics Concern  . Not on file  Social History Narrative  . Not on file   Social Determinants of Health   Financial Resource Strain:   . Difficulty of Paying Living Expenses: Not on file  Food Insecurity:   . Worried About Charity fundraiser in the Last Year: Not on file  . Ran Out of Food in the Last Year: Not on file  Transportation Needs:   .  Lack of Transportation (Medical): Not on file  . Lack of Transportation (Non-Medical): Not on file  Physical Activity:   . Days of Exercise per Week: Not on file  . Minutes of Exercise per Session: Not on file  Stress:   . Feeling of Stress : Not on file  Social Connections:   . Frequency of Communication with Friends and Family: Not on file  . Frequency of Social Gatherings with Friends and Family: Not on file  . Attends Religious Services: Not on file  . Active Member of Clubs or Organizations: Not on file  . Attends Archivist Meetings: Not on file  . Marital Status: Not on file     Family History: The patient's family history includes Healthy in his father and mother. There is no history of Colon cancer, Rectal cancer, or Stomach cancer.  ROS:   Please see the history of present illness.    All other systems reviewed and are negative.  EKGs/Labs/Other Studies Reviewed:    The following studies were reviewed today: Cardiac Cath 06/08/2020: Coronary Findings  Diagnostic Dominance: Right Left Main  Vessel is large. Vessel is angiographically normal. Short LMCA.  Left Anterior Descending  Mid LAD lesion is 35% stenosed.  First Diagonal Branch  Vessel is moderate in size.  Second Diagonal Branch   Vessel is moderate in size.  Left Circumflex  Vessel is large. Vessel is angiographically normal.  First Obtuse Marginal Branch  Vessel is moderate in size.  Second Obtuse Marginal Branch  Vessel is small in size.  Third Obtuse Marginal Branch  Vessel is moderate in size.  Right Coronary Artery  Vessel is large. The vessel exhibits minimal luminal irregularities.  Right Posterior Descending Artery  Vessel is moderate in size.  Right Posterior Atrioventricular Artery  Vessel is moderate in size.  Intervention  No interventions have been documented. Right Heart  Right Heart Pressures RA (mean): 14 mmHg RV (S/EDP): 60/14 mmHg PA (S/D, mean): 60/33 (42) mmHg PCWP (mean): 30 mmHg  Ao sat: 99% PA sat: 60%  Fick CO: 6.7 L/min Fick CI: 2.9 L/min/m^2  Thermodilution CO: 7.3 L/min Thermodilution CI: 3.2 L/min/m^2   Conclusion Conclusions: 1. Mild to moderate, non-obstructive coronary artery disease with 30-40% mid LAD stenosis.  No significant disease identified in the LCx or RCA. 2. Moderately to severely elevated left heart, right heart, and pulmonary artery pressures. 3. Normal cardiac output/index.  Recommendations: 1. Ongoing workup of severe aortic stenosis per valve team. 2. Maintain net even to slightly negative fluid balance today.  If renal function remains stable, careful escalation of diuresis is recommended. 3. Medical therapy and risk factor modification to prevent progression of coronary artery disease.  Echo 06/04/2020: IMPRESSIONS    1. Severely calcified aortic and mitral valves with severe aortic  stenosis and mild mitral stenosis.  2. Left ventricular ejection fraction, by estimation, is 60 to 65%. The  left ventricle has normal function. The left ventricle has no regional  wall motion abnormalities. There is moderate concentric left ventricular  hypertrophy. Left ventricular  diastolic parameters are consistent with Grade II diastolic dysfunction   (pseudonormalization). Elevated left atrial pressure.  3. Right ventricular systolic function is normal. The right ventricular  size is normal.  4. The mitral valve is normal in structure. Mild mitral valve  regurgitation. Mild mitral stenosis. Moderate mitral annular  calcification.  5. The aortic valve is calcified. There is severe calcifcation of the  aortic valve. There is severe thickening of the  aortic valve. Aortic valve  regurgitation is not visualized. Severe aortic valve stenosis. Aortic  valve mean gradient measures 67.0  mmHg.  6. The inferior vena cava is normal in size with greater than 50%  respiratory variability, suggesting right atrial pressure of 3 mmHg.   LEFT VENTRICLE  PLAX 2D  LVIDd:     4.90 cm   Diastology  LVIDs:     3.70 cm   LV e' medial:  4.43 cm/s  LV PW:     1.60 cm   LV E/e' medial: 35.7  LV IVS:    1.20 cm   LV e' lateral:  5.37 cm/s  LVOT diam:   2.40 cm   LV E/e' lateral: 29.4  LV SV:     525  LV SV Index:  228  LVOT Area:   4.52 cm    LV Volumes (MOD)  LV vol d, MOD A2C: 79.6 ml  LV vol d, MOD A4C: 99.0 ml  LV vol s, MOD A2C: 18.7 ml  LV vol s, MOD A4C: 42.3 ml  LV SV MOD A2C:   60.9 ml  LV SV MOD A4C:   99.0 ml  LV SV MOD BP:   61.7 ml   RIGHT VENTRICLE      IVC  RV S prime:   9.65 cm/s IVC diam: 2.50 cm  TAPSE (M-mode): 2.4 cm   LEFT ATRIUM       Index    RIGHT ATRIUM      Index  LA diam:    3.90 cm 1.69 cm/m RA Area:   15.90 cm  LA Vol (A2C):  57.4 ml 24.94 ml/m RA Volume:  39.40 ml 17.12 ml/m  LA Vol (A4C):  27.0 ml 11.73 ml/m  LA Biplane Vol: 40.0 ml 17.38 ml/m  AORTIC VALVE  AV Area (Vmax):  4.52 cm  AV Area (Vmean):  4.45 cm  AV Area (VTI):   4.16 cm  AV Vmax:      520.60 cm/s  AV Vmean:     371.000 cm/s  AV VTI:      1.262 m  AV Peak Grad:   108.4 mmHg  AV Mean Grad:   67.0 mmHg  LVOT Vmax:      520.00 cm/s  LVOT Vmean:    365.000 cm/s  LVOT VTI:     1.160 m  LVOT/AV VTI ratio: 0.92    AORTA  Ao Root diam: 3.60 cm  Ao Asc diam: 3.30 cm   MITRAL VALVE  MV Area (PHT): 2.37 cm   SHUNTS  MV Peak grad: 13.7 mmHg  Systemic VTI: 1.16 m  MV Mean grad: 4.0 mmHg   Systemic Diam: 2.40 cm  MV Vmax:    1.85 m/s  MV Vmean:   85.8 cm/s  MV Decel Time: 320 msec  MV E velocity: 158.00 cm/s  MV A velocity: 94.90 cm/s  MV E/A ratio: 1.66   EKG:  EKG is not ordered today.    Recent Labs: 02/21/2020: TSH 1.72 06/03/2020: Magnesium 1.7 06/04/2020: B Natriuretic Peptide 1,403.3 06/11/2020: ALT 25 06/22/2020: BUN 30; Creatinine, Ser 1.96; Hemoglobin 8.4; Platelets 138; Potassium 4.3; Sodium 141  Recent Lipid Panel No results found for: CHOL, TRIG, HDL, CHOLHDL, VLDL, LDLCALC, LDLDIRECT   Risk Assessment/Calculations:     Physical Exam:    VS:  BP 118/60   Pulse 73   Ht 5' 11.75" (1.822 m) Comment: per pt before amputation  Wt 235 lb (106.6 kg) Comment: per pt weight.  pt is amputee left leg  SpO2 99%   BMI 32.09 kg/m     Wt Readings from Last 3 Encounters:  07/08/20 235 lb (106.6 kg)  07/01/20 225 lb (102.1 kg)  06/24/20 225 lb 15.5 oz (102.5 kg)     GEN:  Well nourished, well developed in no acute distress HEENT: Normal NECK: No JVD;  LYMPHATICS: No lymphadenopathy CARDIAC: RRR, 3/6 harsh late peaking crescendo decrescendo murmur at the right upper sternal border RESPIRATORY:  Clear to auscultation without rales, wheezing or rhonchi  ABDOMEN: Soft, non-tender, non-distended MUSCULOSKELETAL:  No edema; left AKA noted with dressing in place SKIN: Warm and dry NEUROLOGIC:  Alert and oriented x 3 PSYCHIATRIC:  Normal affect   ASSESSMENT:    1. Nonrheumatic aortic valve stenosis    PLAN:    In order of problems listed above:  1. I again reviewed the natural history of severe aortic stenosis with the patient.  This patient has severe, stage D1  aortic stenosis with recent episode of postoperative congestive heart failure requiring IV diuretic therapy.  He has previously described functional class II symptoms of exertional angina and shortness of breath, but at this point is not experiencing any symptoms at his current workload.  He seems to be recovering well from below-knee amputation and will continue to follow closely with Dr. Sharol Given.  Aortic valve replacement is clearly indicated and I reviewed the pros and cons of surgical aortic valve replacement versus transcatheter aortic valve replacement with the patient today.  He understands that the procedural risk, including the risk of acute kidney injury and need for dialysis, would likely be lower with TAVR.  However, he also understands that the risk of structural valve deterioration may be higher with TAVR in the setting of his relatively young age and presence of kidney disease.  I have recommended that he proceed with a gated CTA of the heart as well as a CTA of the chest, abdomen, and pelvis for further evaluation.  This will be performed using our renal protocol to minimize contrast exposure.  Once the study is completed, he will be referred for formal cardiac surgical consultation as part of a multidisciplinary heart team approach to his care.  In addition, the patient has poor dentition and will likely require extraction of all of his remaining teeth.  He understands that this will need to be performed whether he undergoes surgical or transcatheter aortic valve replacement.  We will refer him to Dr. Benson Norway at the [facility] dental clinic for further evaluation and treatment.  I did not make any medication changes today.  Our heart valve team will review his case once all of his studies are completed.    Shared Decision Making/Informed Consent        Medication Adjustments/Labs and Tests Ordered: Current medicines are reviewed at length with the patient today.  Concerns regarding medicines  are outlined above.  Orders Placed This Encounter  Procedures  . CT CORONARY MORPH W/CTA COR W/SCORE W/CA W/CM &/OR WO/CM  . CT ANGIO CHEST AORTA W/CM & OR WO/CM  . CT ANGIO ABDOMEN PELVIS  W &/OR WO CONTRAST   No orders of the defined types were placed in this encounter.   Patient Instructions  You will be contacted with your appointments for next week!    Signed, Sherren Mocha, MD  07/08/2020 12:58 PM    [place]

## 2020-07-08 NOTE — Progress Notes (Signed)
Post-Op Visit Note   Patient: Steven [name]           Date of Birth: 1956-05-19           MRN: [id number] Visit Date: 07/08/2020 PCP: Isaac Bliss, Rayford Halsted, MD  Chief Complaint:  Chief Complaint  Patient presents with  . Left Knee - Pain    HPI:  HPI The patient is a 64 year old gentleman seen 5 weeks status post left below-knee amputation he has been doing well he has been using his shrinker with direct skin contact he has 8 days left of his doxycycline course  Ortho Exam 3 remaining lateral staples harvested today.  He has scattered areas of granulation and eschar along his incision this is overall healing quite well there is no drainage bleeding no maceration no erythema warmth no sign of infection  Visit Diagnoses: No diagnosis found.  Plan: Given an order for his prosthesis set up.  He will follow-up in the office once more in 3 weeks.  Proceed with prosthesis set up  Follow-Up Instructions: No follow-ups on file.   Imaging: No results found.  Orders:  No orders of the defined types were placed in this encounter.  No orders of the defined types were placed in this encounter.    PMFS History: Patient Active Problem List   Diagnosis Date Noted  . Acute blood loss anemia   . S/P BKA (below knee amputation) unilateral, left ([place])   . Aortic valve stenosis   . Open wound of left foot 06/01/2020  . Osteomyelitis of foot ([place]) 06/01/2020  . CKD (chronic kidney disease), stage III ([place]) 06/01/2020  . Diabetic foot infection ([place])   . Chronic pain 02/28/2020  . Vitamin D deficiency 02/25/2020  . Thrombocytopenia ([place]) 02/25/2020  . Normocytic anemia 02/25/2020  . Chronic venous hypertension (idiopathic) with ulcer and inflammation of right lower extremity ([place])   . Non-pressure chronic ulcer of right heel and midfoot limited to breakdown of skin ([place])   . Diabetic polyneuropathy associated with type 2 diabetes mellitus ([place])   . Venous insufficiency   .  Cellulitis of right foot 12/21/2019  . Obesity, Class III, BMI 40-49.9 (morbid obesity) ([place])   . Osteomyelitis ([place]) 07/17/2017  . Kidney transplant recipient 05/24/2015  . Diabetes mellitus ([place]) 05/10/2012  . Hyperlipidemia 05/10/2012  . Essential hypertension 05/10/2012   Past Medical History:  Diagnosis Date  . Cataract    removed by surgery  . Diabetes ([place])    type 2  . Diabetic glomerulosclerosis [facility])    [facility] 03/21/12 by Dr. Corliss Parish.  . GERD (gastroesophageal reflux disease)   . HLD (hyperlipidemia)   . Hyperparathyroidism ([place])   . Hypertension    Patient denies this DX, no meds  . Kidney transplant recipient   . Obesity   . Sleep apnea    uses CPAP nightly  . Venous insufficiency   . Vitamin D deficiency     Family History  Problem Relation Age of Onset  . Healthy Mother   . Healthy Father   . Colon cancer Neg Hx   . Rectal cancer Neg Hx   . Stomach cancer Neg Hx     Past Surgical History:  Procedure Laterality Date  . AMPUTATION Right 07/19/2017   Procedure: AMPUTATION RIGHT 2ND TOE;  Surgeon: Leandrew Koyanagi, MD;  Location: [place];  Service: Orthopedics;  Laterality: Right;  . AMPUTATION Left 06/03/2020   Procedure: LEFT BELOW KNEE AMPUTATION;  Surgeon: Newt Minion, MD;  Location: [place];  Service: Orthopedics;  Laterality: Left;  . COLONOSCOPY     greater than 10 yrs ago  . EYE SURGERY Bilateral    cataracts removed  . KIDNEY TRANSPLANT  2006  . NEPHRECTOMY TRANSPLANTED ORGAN  2006  . RIGHT HEART CATH AND CORONARY ANGIOGRAPHY N/A 06/08/2020   Procedure: RIGHT HEART CATH AND CORONARY ANGIOGRAPHY;  Surgeon: Nelva Bush, MD;  Location: [place] [facility];  Service: Cardiovascular;  Laterality: N/A;  . UPPER GASTROINTESTINAL ENDOSCOPY    . WISDOM TOOTH EXTRACTION     Social History   Occupational History  . Occupation: trooper  Tobacco Use  . Smoking status: Never Smoker  . Smokeless tobacco: Former Systems developer     Types: Secondary school teacher  . Vaping Use: Never used  Substance and Sexual Activity  . Alcohol use: No  . Drug use: No  . Sexual activity: Yes

## 2020-07-08 NOTE — Progress Notes (Signed)
Cardiology Office Note:    Date:  07/08/2020   ID:  [name], DOB 26-Mar-1956, MRN [id number]  PCP:  Isaac Bliss, Rayford Halsted, MD  [facility] Cardiologist:  Sherren Mocha, MD  [place] Electrophysiologist:  None   Referring MD: Isaac Bliss, Est[place]*   Chief Complaint  Patient presents with  . Aortic Stenosis    History of Present Illness:    Hicks Feick is a 64 y.o. male presenting for follow-up of severe aortic stenosis.  The patient has a complicated medical history with heavy NSAID use and uncontrolled diabetes leading to end-stage renal disease.  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 [name] ever since that time.  He has a longstanding heart murmur.  He was hospitalized in November 2021 with a nonhealing diabetic foot ulcer, failing outpatient antibiotics.  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  He then developed postoperative volume overload with acute diastolic heart failure requiring IV furosemide.  BNP was elevated and chest x-ray demonstrated interstitial edema consistent with congestive heart failure.  An echocardiogram was performed and the patient was found to have very severe aortic stenosis with a mean transvalvular gradient greater than 60 mmHg.  He was seen in formal consultation during his hospital stay on June 05, 2020.  At that time he had improved with IV diuretic therapy and was felt to be stable to continue with his postoperative rehab.  He did undergo right and left heart catheterization and echo studies while he was in the hospital.  His echocardiogram is copied below and showed findings consistent with very severe aortic stenosis and preserved LV systolic function.  His cardiac catheterization demonstrated nonobstructive coronary artery disease with elevated right and left heart filling pressures.  The patient is here today  with his mother to discuss further management of severe aortic stenosis.  The patient reports that he is progressing well.  He has participated in physical therapy.  He is still having some oozing from his amputation site in the left lower leg.  He follows up with Dr. Sharol Given this afternoon.  He has not had any recent chest pain or pressure, shortness of breath, or lightheadedness/syncope.  Past Medical History:  Diagnosis Date  . Cataract    removed by surgery  . Diabetes ([place])    type 2  . 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 Corliss Parish.  . GERD (gastroesophageal reflux disease)   . HLD (hyperlipidemia)   . Hyperparathyroidism ([place])   . Hypertension    Patient denies this DX, no meds  . Kidney transplant recipient   . Obesity   . Sleep apnea    uses CPAP nightly  . Venous insufficiency   . Vitamin D deficiency     Past Surgical History:  Procedure Laterality Date  . AMPUTATION Right 07/19/2017   Procedure: AMPUTATION RIGHT 2ND TOE;  Surgeon: Leandrew Koyanagi, MD;  Location: [place];  Service: Orthopedics;  Laterality: Right;  . AMPUTATION Left 06/03/2020   Procedure: LEFT BELOW KNEE AMPUTATION;  Surgeon: Newt Minion, MD;  Location: [place];  Service: Orthopedics;  Laterality: Left;  . COLONOSCOPY     greater than 10 yrs ago  . EYE SURGERY Bilateral    cataracts removed  . KIDNEY TRANSPLANT  2006  . NEPHRECTOMY TRANSPLANTED ORGAN  2006  . RIGHT HEART CATH AND CORONARY ANGIOGRAPHY N/A 06/08/2020  Procedure: RIGHT HEART CATH AND CORONARY ANGIOGRAPHY;  Surgeon: Nelva Bush, MD;  Location: [place] CV LAB;  Service: Cardiovascular;  Laterality: N/A;  . UPPER GASTROINTESTINAL ENDOSCOPY    . WISDOM TOOTH EXTRACTION      Current Medications: Current Meds  Medication Sig  . acetaminophen-codeine (TYLENOL #3) 300-30 MG tablet Take 1 tablet by mouth every 6 (six) hours as needed for severe pain.  Marland Kitchen aspirin EC 81 MG EC tablet  Take 1 tablet (81 mg total) by mouth daily. Swallow whole.  Marland Kitchen atorvastatin (LIPITOR) 10 MG tablet Take 1 tablet (10 mg total) by mouth daily.  . cyclobenzaprine (FLEXERIL) 5 MG tablet Take 1 tablet (5 mg total) by mouth 3 (three) times daily as needed for muscle spasms.  Marland Kitchen docusate sodium (COLACE) 100 MG capsule Take 1 capsule (100 mg total) by mouth 2 (two) times daily.  Marland Kitchen doxycycline (VIBRA-TABS) 100 MG tablet Take 1 tablet (100 mg total) by mouth 2 (two) times daily.  . fenofibrate (TRICOR) 48 MG tablet Take 1 tablet (48 mg total) by mouth daily.  . furosemide (LASIX) 20 MG tablet Take 1 tablet (20 mg total) by mouth daily.  Marland Kitchen glyBURIDE (DIABETA) 5 MG tablet Take 1 tablet (5 mg total) by mouth 2 (two) times daily with a meal.  . insulin aspart (NOVOLOG) 100 UNIT/ML injection Inject 0-30 Units into the skin 3 (three) times daily as needed for high blood sugar. Sliding scale  . insulin detemir (LEVEMIR) 100 UNIT/ML FlexPen Inject 10 Units into the skin daily.  . Insulin Pen Needle (BD PEN NEEDLE NANO 2ND GEN) 32G X 4 MM MISC Use daily for glucose control  . Insulin Pen Needle 32G X 4 MM MISC Use as directed with insulin pen  . mycophenolate (MYFORTIC) 360 MG TBEC EC tablet Take 1 tablet (360 mg total) by mouth 2 (two) times daily.  Marland Kitchen omeprazole (PRILOSEC) 20 MG capsule Take 1 capsule (20 mg total) by mouth daily.  Glory Rosebush VERIO test strip 1 each by Other route 3 (three) times daily.  . polyethylene glycol (MIRALAX / GLYCOLAX) 17 g packet Take 17 g by mouth daily as needed for mild constipation.  . tacrolimus (PROGRAF) 1 MG capsule Take 1 capsule (1 mg total) by mouth 2 (two) times daily.  . traZODone (DESYREL) 50 MG tablet Take 1 tablet (50 mg total) by mouth at bedtime.     Allergies:   Sulfa antibiotics   Social History   Socioeconomic History  . Marital status: Legally Separated    Spouse name: Not on file  . Number of children: Not on file  . Years of education: Not on file  .  Highest education level: Not on file  Occupational History  . Occupation: trooper  Tobacco Use  . Smoking status: Never Smoker  . Smokeless tobacco: Former Systems developer    Types: Secondary school teacher  . Vaping Use: Never used  Substance and Sexual Activity  . Alcohol use: No  . Drug use: No  . Sexual activity: Yes  Other Topics Concern  . Not on file  Social History Narrative  . Not on file   Social Determinants of Health   Financial Resource Strain:   . Difficulty of Paying Living Expenses: Not on file  Food Insecurity:   . Worried About Charity fundraiser in the Last Year: Not on file  . Ran Out of Food in the Last Year: Not on file  Transportation Needs:   .  Lack of Transportation (Medical): Not on file  . Lack of Transportation (Non-Medical): Not on file  Physical Activity:   . Days of Exercise per Week: Not on file  . Minutes of Exercise per Session: Not on file  Stress:   . Feeling of Stress : Not on file  Social Connections:   . Frequency of Communication with Friends and Family: Not on file  . Frequency of Social Gatherings with Friends and Family: Not on file  . Attends Religious Services: Not on file  . Active Member of Clubs or Organizations: Not on file  . Attends Archivist Meetings: Not on file  . Marital Status: Not on file     Family History: The patient's family history includes Healthy in his father and mother. There is no history of Colon cancer, Rectal cancer, or Stomach cancer.  ROS:   Please see the history of present illness.    All other systems reviewed and are negative.  EKGs/Labs/Other Studies Reviewed:    The following studies were reviewed today: Cardiac Cath 06/08/2020: Coronary Findings  Diagnostic Dominance: Right Left Main  Vessel is large. Vessel is angiographically normal. Short LMCA.  Left Anterior Descending  Mid LAD lesion is 35% stenosed.  First Diagonal Branch  Vessel is moderate in size.  Second Diagonal Branch   Vessel is moderate in size.  Left Circumflex  Vessel is large. Vessel is angiographically normal.  First Obtuse Marginal Branch  Vessel is moderate in size.  Second Obtuse Marginal Branch  Vessel is small in size.  Third Obtuse Marginal Branch  Vessel is moderate in size.  Right Coronary Artery  Vessel is large. The vessel exhibits minimal luminal irregularities.  Right Posterior Descending Artery  Vessel is moderate in size.  Right Posterior Atrioventricular Artery  Vessel is moderate in size.  Intervention  No interventions have been documented. Right Heart  Right Heart Pressures RA (mean): 14 mmHg RV (S/EDP): 60/14 mmHg PA (S/D, mean): 60/33 (42) mmHg PCWP (mean): 30 mmHg  Ao sat: 99% PA sat: 60%  Fick CO: 6.7 L/min Fick CI: 2.9 L/min/m^2  Thermodilution CO: 7.3 L/min Thermodilution CI: 3.2 L/min/m^2   Conclusion Conclusions: 1. Mild to moderate, non-obstructive coronary artery disease with 30-40% mid LAD stenosis.  No significant disease identified in the LCx or RCA. 2. Moderately to severely elevated left heart, right heart, and pulmonary artery pressures. 3. Normal cardiac output/index.  Recommendations: 1. Ongoing workup of severe aortic stenosis per valve team. 2. Maintain net even to slightly negative fluid balance today.  If renal function remains stable, careful escalation of diuresis is recommended. 3. Medical therapy and risk factor modification to prevent progression of coronary artery disease.  Echo 06/04/2020: IMPRESSIONS    1. Severely calcified aortic and mitral valves with severe aortic  stenosis and mild mitral stenosis.  2. Left ventricular ejection fraction, by estimation, is 60 to 65%. The  left ventricle has normal function. The left ventricle has no regional  wall motion abnormalities. There is moderate concentric left ventricular  hypertrophy. Left ventricular  diastolic parameters are consistent with Grade II diastolic dysfunction   (pseudonormalization). Elevated left atrial pressure.  3. Right ventricular systolic function is normal. The right ventricular  size is normal.  4. The mitral valve is normal in structure. Mild mitral valve  regurgitation. Mild mitral stenosis. Moderate mitral annular  calcification.  5. The aortic valve is calcified. There is severe calcifcation of the  aortic valve. There is severe thickening of the  aortic valve. Aortic valve  regurgitation is not visualized. Severe aortic valve stenosis. Aortic  valve mean gradient measures 67.0  mmHg.  6. The inferior vena cava is normal in size with greater than 50%  respiratory variability, suggesting right atrial pressure of 3 mmHg.   LEFT VENTRICLE  PLAX 2D  LVIDd:     4.90 cm   Diastology  LVIDs:     3.70 cm   LV e' medial:  4.43 cm/s  LV PW:     1.60 cm   LV E/e' medial: 35.7  LV IVS:    1.20 cm   LV e' lateral:  5.37 cm/s  LVOT diam:   2.40 cm   LV E/e' lateral: 29.4  LV SV:     525  LV SV Index:  228  LVOT Area:   4.52 cm    LV Volumes (MOD)  LV vol d, MOD A2C: 79.6 ml  LV vol d, MOD A4C: 99.0 ml  LV vol s, MOD A2C: 18.7 ml  LV vol s, MOD A4C: 42.3 ml  LV SV MOD A2C:   60.9 ml  LV SV MOD A4C:   99.0 ml  LV SV MOD BP:   61.7 ml   RIGHT VENTRICLE      IVC  RV S prime:   9.65 cm/s IVC diam: 2.50 cm  TAPSE (M-mode): 2.4 cm   LEFT ATRIUM       Index    RIGHT ATRIUM      Index  LA diam:    3.90 cm 1.69 cm/m RA Area:   15.90 cm  LA Vol (A2C):  57.4 ml 24.94 ml/m RA Volume:  39.40 ml 17.12 ml/m  LA Vol (A4C):  27.0 ml 11.73 ml/m  LA Biplane Vol: 40.0 ml 17.38 ml/m  AORTIC VALVE  AV Area (Vmax):  4.52 cm  AV Area (Vmean):  4.45 cm  AV Area (VTI):   4.16 cm  AV Vmax:      520.60 cm/s  AV Vmean:     371.000 cm/s  AV VTI:      1.262 m  AV Peak Grad:   108.4 mmHg  AV Mean Grad:   67.0 mmHg  LVOT Vmax:      520.00 cm/s  LVOT Vmean:    365.000 cm/s  LVOT VTI:     1.160 m  LVOT/AV VTI ratio: 0.92    AORTA  Ao Root diam: 3.60 cm  Ao Asc diam: 3.30 cm   MITRAL VALVE  MV Area (PHT): 2.37 cm   SHUNTS  MV Peak grad: 13.7 mmHg  Systemic VTI: 1.16 m  MV Mean grad: 4.0 mmHg   Systemic Diam: 2.40 cm  MV Vmax:    1.85 m/s  MV Vmean:   85.8 cm/s  MV Decel Time: 320 msec  MV E velocity: 158.00 cm/s  MV A velocity: 94.90 cm/s  MV E/A ratio: 1.66   EKG:  EKG is not ordered today.    Recent Labs: 02/21/2020: TSH 1.72 06/03/2020: Magnesium 1.7 06/04/2020: B Natriuretic Peptide 1,403.3 06/11/2020: ALT 25 06/22/2020: BUN 30; Creatinine, Ser 1.96; Hemoglobin 8.4; Platelets 138; Potassium 4.3; Sodium 141  Recent Lipid Panel No results found for: CHOL, TRIG, HDL, CHOLHDL, VLDL, LDLCALC, LDLDIRECT   Risk Assessment/Calculations:     Physical Exam:    VS:  BP 118/60   Pulse 73   Ht 5' 11.75" (1.822 m) Comment: per pt before amputation  Wt 235 lb (106.6 kg) Comment: per pt weight.  pt is amputee left leg  SpO2 99%   BMI 32.09 kg/m     Wt Readings from Last 3 Encounters:  07/08/20 235 lb (106.6 kg)  07/01/20 225 lb (102.1 kg)  06/24/20 225 lb 15.5 oz (102.5 kg)     GEN:  Well nourished, well developed in no acute distress HEENT: Normal NECK: No JVD;  LYMPHATICS: No lymphadenopathy CARDIAC: RRR, 3/6 harsh late peaking crescendo decrescendo murmur at the right upper sternal border RESPIRATORY:  Clear to auscultation without rales, wheezing or rhonchi  ABDOMEN: Soft, non-tender, non-distended MUSCULOSKELETAL:  No edema; left AKA noted with dressing in place SKIN: Warm and dry NEUROLOGIC:  Alert and oriented x 3 PSYCHIATRIC:  Normal affect   ASSESSMENT:    1. Nonrheumatic aortic valve stenosis    PLAN:    In order of problems listed above:  1. I again reviewed the natural history of severe aortic stenosis with the patient.  This patient has severe, stage D1  aortic stenosis with recent episode of postoperative congestive heart failure requiring IV diuretic therapy.  He has previously described functional class II symptoms of exertional angina and shortness of breath, but at this point is not experiencing any symptoms at his current workload.  He seems to be recovering well from below-knee amputation and will continue to follow closely with Dr. Sharol Given.  Aortic valve replacement is clearly indicated and I reviewed the pros and cons of surgical aortic valve replacement versus transcatheter aortic valve replacement with the patient today.  He understands that the procedural risk, including the risk of acute kidney injury and need for dialysis, would likely be lower with TAVR.  However, he also understands that the risk of structural valve deterioration may be higher with TAVR in the setting of his relatively young age and presence of kidney disease.  I have recommended that he proceed with a gated CTA of the heart as well as a CTA of the chest, abdomen, and pelvis for further evaluation.  This will be performed using our renal protocol to minimize contrast exposure.  Once the study is completed, he will be referred for formal cardiac surgical consultation as part of a multidisciplinary heart team approach to his care.  In addition, the patient has poor dentition and will likely require extraction of all of his remaining teeth.  He understands that this will need to be performed whether he undergoes surgical or transcatheter aortic valve replacement.  We will refer him to Dr. Benson Norway at the [facility] dental clinic for further evaluation and treatment.  I did not make any medication changes today.  Our heart valve team will review his case once all of his studies are completed.    Shared Decision Making/Informed Consent        Medication Adjustments/Labs and Tests Ordered: Current medicines are reviewed at length with the patient today.  Concerns regarding medicines  are outlined above.  Orders Placed This Encounter  Procedures  . CT CORONARY MORPH W/CTA COR W/SCORE W/CA W/CM &/OR WO/CM  . CT ANGIO CHEST AORTA W/CM & OR WO/CM  . CT ANGIO ABDOMEN PELVIS  W &/OR WO CONTRAST   No orders of the defined types were placed in this encounter.   Patient Instructions  You will be contacted with your appointments for next week!    Signed, Sherren Mocha, MD  07/08/2020 12:58 PM    [place]

## 2020-07-09 ENCOUNTER — Other Ambulatory Visit: Payer: Self-pay

## 2020-07-09 ENCOUNTER — Ambulatory Visit (INDEPENDENT_AMBULATORY_CARE_PROVIDER_SITE_OTHER): Payer: BC Managed Care – PPO | Admitting: Internal Medicine

## 2020-07-09 DIAGNOSIS — Z09 Encounter for follow-up examination after completed treatment for conditions other than malignant neoplasm: Secondary | ICD-10-CM | POA: Diagnosis not present

## 2020-07-09 DIAGNOSIS — I350 Nonrheumatic aortic (valve) stenosis: Secondary | ICD-10-CM | POA: Diagnosis not present

## 2020-07-09 DIAGNOSIS — E559 Vitamin D deficiency, unspecified: Secondary | ICD-10-CM | POA: Diagnosis not present

## 2020-07-09 DIAGNOSIS — M86072 Acute hematogenous osteomyelitis, left ankle and foot: Secondary | ICD-10-CM | POA: Diagnosis not present

## 2020-07-09 DIAGNOSIS — E785 Hyperlipidemia, unspecified: Secondary | ICD-10-CM

## 2020-07-09 DIAGNOSIS — Z89512 Acquired absence of left leg below knee: Secondary | ICD-10-CM

## 2020-07-09 DIAGNOSIS — Z940 Kidney transplant status: Secondary | ICD-10-CM

## 2020-07-09 DIAGNOSIS — E1122 Type 2 diabetes mellitus with diabetic chronic kidney disease: Secondary | ICD-10-CM

## 2020-07-09 DIAGNOSIS — Z794 Long term (current) use of insulin: Secondary | ICD-10-CM

## 2020-07-09 DIAGNOSIS — N1832 Chronic kidney disease, stage 3b: Secondary | ICD-10-CM

## 2020-07-09 DIAGNOSIS — I10 Essential (primary) hypertension: Secondary | ICD-10-CM

## 2020-07-09 DIAGNOSIS — N183 Chronic kidney disease, stage 3 unspecified: Secondary | ICD-10-CM

## 2020-07-09 MED ORDER — METOPROLOL TARTRATE 50 MG PO TABS: ORAL_TABLET | ORAL | 0 refills | Status: DC

## 2020-07-09 NOTE — Progress Notes (Signed)
Established Patient Office Visit     This visit occurred during the SARS-CoV-2 public health emergency.  Safety protocols were in place, including screening questions prior to the visit, additional usage of staff PPE, and extensive cleaning of exam room while observing appropriate contact time as indicated for disinfecting solutions.    CC/Reason for Visit: Hospital discharge follow-up, follow-up chronic medical conditions  HPI: Steven [name] is a 64 y.o. male who is coming in today for the above mentioned reasons. Past Medical History is significant for: obesity, class I (BMI 33), OSA, HTN, HLD, type 2 diabetes, CKD Stage 3b status post renal transplant in 2006 on immunosuppressants .  He was hospitalized from November 1 through June 10, 2020 initially due to a diabetic foot infection was identified to have osteomyelitis and required a below the knee amputation.  This was performed on 11/30.  Unfortunately during that hospitalization he developed acute onset respiratory distress.  He was found to have pulmonary edema due to severe aortic valve stenosis, he also developed acute on chronic kidney disease stage IIIb.  He underwent diuresis and a cardiac catheterization.  He has already seen cardiology as an outpatient.  They are planning for potential TAVR.  He is currently undergoing the work-up.  Has an appointment next week to see the dentist prior to.  He is planning to see cardiovascular soon as well.  His A1c was uncontrolled while he was hospitalized at 9.3.  He states his CBGs have been in the 1 10-1 80 range since discharge.  He has been doing 10 units of Levemir plus NovoLog sliding scale.  He has followed up with orthopedist, Dr. Sharol Given who believes that wound is healing well.  He has a follow-up soon with his nephrologist.  I was asked by discharging hospitalist to check renal function today.  After discharge from the hospital he stayed at [facility] until the day before Thanksgiving.   He has already had home health PT and OT out to his house.  He seems to be in good spirits.  He is here today with a friend who has been assisting with dressing changes.  He is currently in a wheelchair.  He would like a handicap placard filled out.   Past Medical/Surgical History: Past Medical History:  Diagnosis Date  . Cataract    removed by surgery  . Diabetes ([place])    type 2  . 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 Corliss Parish.  . GERD (gastroesophageal reflux disease)   . HLD (hyperlipidemia)   . Hyperparathyroidism ([place])   . Hypertension    Patient denies this DX, no meds  . Kidney transplant recipient   . Obesity   . Sleep apnea    uses CPAP nightly  . Venous insufficiency   . Vitamin D deficiency     Past Surgical History:  Procedure Laterality Date  . AMPUTATION Right 07/19/2017   Procedure: AMPUTATION RIGHT 2ND TOE;  Surgeon: Leandrew Koyanagi, MD;  Location: [place];  Service: Orthopedics;  Laterality: Right;  . AMPUTATION Left 06/03/2020   Procedure: LEFT BELOW KNEE AMPUTATION;  Surgeon: Newt Minion, MD;  Location: [place];  Service: Orthopedics;  Laterality: Left;  . COLONOSCOPY     greater than 10 yrs ago  . EYE SURGERY Bilateral    cataracts removed  . KIDNEY TRANSPLANT  2006  . NEPHRECTOMY TRANSPLANTED ORGAN  2006  . RIGHT HEART CATH AND CORONARY ANGIOGRAPHY N/A  06/08/2020   Procedure: RIGHT HEART CATH AND CORONARY ANGIOGRAPHY;  Surgeon: Nelva Bush, MD;  Location: [place] [facility];  Service: Cardiovascular;  Laterality: N/A;  . UPPER GASTROINTESTINAL ENDOSCOPY    . WISDOM TOOTH EXTRACTION      Social History:  reports that he has never smoked. He quit smokeless tobacco use about 23 months ago.  His smokeless tobacco use included chew. He reports that he does not drink alcohol and does not use drugs.  Allergies: Allergies  Allergen Reactions  . Sulfa Antibiotics Rash    Family History:  Family  History  Problem Relation Age of Onset  . Healthy Mother   . Healthy Father   . Colon cancer Neg Hx   . Rectal cancer Neg Hx   . Stomach cancer Neg Hx      Current Outpatient Medications:  .  acetaminophen-codeine (TYLENOL #3) 300-30 MG tablet, Take 1 tablet by mouth every 6 (six) hours as needed for severe pain., Disp: 28 tablet, Rfl: 0 .  aspirin EC 81 MG EC tablet, Take 1 tablet (81 mg total) by mouth daily. Swallow whole., Disp: 30 tablet, Rfl: 11 .  atorvastatin (LIPITOR) 10 MG tablet, Take 1 tablet (10 mg total) by mouth daily., Disp: , Rfl:  .  cyclobenzaprine (FLEXERIL) 5 MG tablet, Take 1 tablet (5 mg total) by mouth 3 (three) times daily as needed for muscle spasms., Disp: 30 tablet, Rfl: 0 .  docusate sodium (COLACE) 100 MG capsule, Take 1 capsule (100 mg total) by mouth 2 (two) times daily., Disp: 60 capsule, Rfl: 0 .  doxycycline (VIBRA-TABS) 100 MG tablet, Take 1 tablet (100 mg total) by mouth 2 (two) times daily., Disp: 60 tablet, Rfl: 0 .  fenofibrate (TRICOR) 48 MG tablet, Take 1 tablet (48 mg total) by mouth daily., Disp: , Rfl:  .  furosemide (LASIX) 20 MG tablet, Take 1 tablet (20 mg total) by mouth daily., Disp: 30 tablet, Rfl: ` .  glyBURIDE (DIABETA) 5 MG tablet, Take 1 tablet (5 mg total) by mouth 2 (two) times daily with a meal., Disp: , Rfl:  .  insulin aspart (NOVOLOG) 100 UNIT/ML injection, Inject 0-30 Units into the skin 3 (three) times daily as needed for high blood sugar. Sliding scale, Disp: 10 mL, Rfl: 11 .  insulin detemir (LEVEMIR) 100 UNIT/ML FlexPen, Inject 10 Units into the skin daily., Disp: 15 mL, Rfl: 1 .  Insulin Pen Needle (BD PEN NEEDLE NANO 2ND GEN) 32G X 4 MM MISC, Use daily for glucose control, Disp: 100 each, Rfl: 3 .  Insulin Pen Needle 32G X 4 MM MISC, Use as directed with insulin pen, Disp: 100 each, Rfl: 1 .  mycophenolate (MYFORTIC) 360 MG TBEC EC tablet, Take 1 tablet (360 mg total) by mouth 2 (two) times daily., Disp: 120 tablet, Rfl:  .   omeprazole (PRILOSEC) 20 MG capsule, Take 1 capsule (20 mg total) by mouth daily., Disp: 90 capsule, Rfl: 3 .  ONETOUCH VERIO test strip, 1 each by Other route 3 (three) times daily., Disp: 100 each, Rfl: 12 .  polyethylene glycol (MIRALAX / GLYCOLAX) 17 g packet, Take 17 g by mouth daily as needed for mild constipation., Disp: 14 each, Rfl: 0 .  tacrolimus (PROGRAF) 1 MG capsule, Take 1 capsule (1 mg total) by mouth 2 (two) times daily., Disp: , Rfl:  .  traZODone (DESYREL) 50 MG tablet, Take 1 tablet (50 mg total) by mouth at bedtime., Disp: 30 tablet, Rfl: 0  Review of Systems:  Constitutional: Denies fever, chills, diaphoresis, appetite change and fatigue.  HEENT: Denies photophobia, eye pain, redness, hearing loss, ear pain, congestion, sore throat, rhinorrhea, sneezing, mouth sores, trouble swallowing, neck pain, neck stiffness and tinnitus.   Respiratory: Denies SOB, DOE, cough, chest tightness,  and wheezing.   Cardiovascular: Denies chest pain, palpitations and leg swelling.  Gastrointestinal: Denies nausea, vomiting, abdominal pain, diarrhea, constipation, blood in stool and abdominal distention.  Genitourinary: Denies dysuria, urgency, frequency, hematuria, flank pain and difficulty urinating.  Endocrine: Denies: hot or cold intolerance, sweats, changes in hair or nails, polyuria, polydipsia. Musculoskeletal: Denies myalgias, back pain, joint swelling, arthralgias and gait problem.  Skin: Denies pallor, rash and wound.  Neurological: Denies dizziness, seizures, syncope, weakness, light-headedness, numbness and headaches.  Hematological: Denies adenopathy. Easy bruising, personal or family bleeding history  Psychiatric/Behavioral: Denies suicidal ideation, mood changes, confusion, nervousness, sleep disturbance and agitation    Physical Exam: There were no vitals filed for this visit.  There is no height or weight on file to calculate BMI.   Constitutional: NAD, calm,  comfortable, in a wheelchair Eyes: PERRL, lids and conjunctivae normal ENMT: Mucous membranes are moist.  Respiratory: clear to auscultation bilaterally, no wheezing, no crackles. Normal respiratory effort. No accessory muscle use.  Cardiovascular: Regular rate and rhythm, strong systolic ejection murmur, no rubs / gallops. No extremity edema. 2+ pedal pulses. No carotid bruits.  Abdomen: no tenderness, no masses palpated. No hepatosplenomegaly. Bowel sounds positive.  Musculoskeletal: Status post left BKA. Psychiatric: Normal judgment and insight. Alert and oriented x 3. Normal mood.    Impression and Plan:  Hospital discharge follow-up  Nonrheumatic aortic valve stenosis  Acute hematogenous osteomyelitis of left foot (HCC)  Vitamin D deficiency  S/P BKA (below knee amputation) unilateral, left (HCC)  Essential hypertension - Plan: CBC with Differential/Platelet, Comprehensive metabolic panel, Comprehensive metabolic panel, CBC with Differential/Platelet  Type 2 diabetes mellitus with stage 3 chronic kidney disease, with long-term current use of insulin, unspecified whether stage 3a or 3b CKD ([place])  Hyperlipidemia, unspecified hyperlipidemia type  Stage 3b chronic kidney disease ([place])  Kidney transplant recipient  -He appears to be in great spirits following hospitalization.  Work-up is already underway for his TAVR.  He has already seen orthopedics who is happy with the progression of his wound.  He has home health RN and has friends and family members assisting with dressing changes at home.  He continues to have PT and OT at home.  He has had some issues with durable medical equipment not being delivered to his house.  I will see how we can assist him with this today.  We have discussed his diabetic management.  After diet review he is eating too many carbs.  Which I believe is leading to overcorrection of his CBGs which in turn leads to early morning hypoglycemia.  We have talked  about diet based more on protein, less on complex carbs.  He will follow-up with me in 3 months.    Lelon Frohlich, MD [facility]

## 2020-07-10 LAB — COMPREHENSIVE METABOLIC PANEL
AG Ratio: 1.4 (calc) (ref 1.0–2.5)
ALT: 14 U/L (ref 9–46)
AST: 12 U/L (ref 10–35)
Albumin: 3.9 g/dL (ref 3.6–5.1)
Alkaline phosphatase (APISO): 108 U/L (ref 35–144)
BUN/Creatinine Ratio: 24 (calc) — ABNORMAL HIGH (ref 6–22)
BUN: 48 mg/dL — ABNORMAL HIGH (ref 7–25)
CO2: 25 mmol/L (ref 20–32)
Calcium: 9 mg/dL (ref 8.6–10.3)
Chloride: 105 mmol/L (ref 98–110)
Creat: 1.99 mg/dL — ABNORMAL HIGH (ref 0.70–1.25)
Globulin: 2.8 g/dL (calc) (ref 1.9–3.7)
Glucose, Bld: 196 mg/dL — ABNORMAL HIGH (ref 65–99)
Potassium: 4.2 mmol/L (ref 3.5–5.3)
Sodium: 138 mmol/L (ref 135–146)
Total Bilirubin: 0.8 mg/dL (ref 0.2–1.2)
Total Protein: 6.7 g/dL (ref 6.1–8.1)

## 2020-07-10 LAB — CBC WITH DIFFERENTIAL/PLATELET
Absolute Monocytes: 198 cells/uL — ABNORMAL LOW (ref 200–950)
Basophils Absolute: 20 cells/uL (ref 0–200)
Basophils Relative: 0.6 %
Eosinophils Absolute: 69 cells/uL (ref 15–500)
Eosinophils Relative: 2.1 %
HCT: 32.1 % — ABNORMAL LOW (ref 38.5–50.0)
Hemoglobin: 10.3 g/dL — ABNORMAL LOW (ref 13.2–17.1)
Lymphs Abs: 528 cells/uL — ABNORMAL LOW (ref 850–3900)
MCH: 25.8 pg — ABNORMAL LOW (ref 27.0–33.0)
MCHC: 32.1 g/dL (ref 32.0–36.0)
MCV: 80.5 fL (ref 80.0–100.0)
MPV: 9.4 fL (ref 7.5–12.5)
Monocytes Relative: 6 %
Neutro Abs: 2485 cells/uL (ref 1500–7800)
Neutrophils Relative %: 75.3 %
Platelets: 123 10*3/uL — ABNORMAL LOW (ref 140–400)
RBC: 3.99 10*6/uL — ABNORMAL LOW (ref 4.20–5.80)
RDW: 15.5 % — ABNORMAL HIGH (ref 11.0–15.0)
Total Lymphocyte: 16 %
WBC: 3.3 10*3/uL — ABNORMAL LOW (ref 3.8–10.8)

## 2020-07-14 ENCOUNTER — Other Ambulatory Visit: Payer: Self-pay

## 2020-07-14 ENCOUNTER — Ambulatory Visit (HOSPITAL_COMMUNITY): Payer: Self-pay | Admitting: Dentistry

## 2020-07-14 DIAGNOSIS — I350 Nonrheumatic aortic (valve) stenosis: Secondary | ICD-10-CM

## 2020-07-14 DIAGNOSIS — Z01818 Encounter for other preprocedural examination: Secondary | ICD-10-CM

## 2020-07-14 NOTE — Progress Notes (Signed)
DENTAL VISIT OUTPATIENT CONSULTATION  Service Date:   07/14/2020 Referring Provider:                  Sherren Mocha, MD  Patient Name:   Steven [name] Date of Birth:   June 04, 1956 Medical Record Number: [id number]    PLAN & RECOMMENDATIONS   >> The patient has multiple teeth with chronic periapical infections, retained root tips, and teeth with severe dental decay.  Recommend extractions of all indicated teeth prior to heart valve surgery to decrease risk of perioperative and postoperative systemic infection and complications.   >> Recommend performing all extractions and necessary treatment in the operating room as long as there are no contraindications after discussion with medical team.     >> Recommend patient establish care at an outside dental office of his choice for routine dental care including replacement of missing teeth, cleanings, fillings and exams once he is medically optimized and cleared for routine dental treatment s/p heart surgery.  >> Discussed in detail all treatment options with the patient and he is agreeable to the plan.   Thank you for consulting with [facility] and for the opportunity to participate in this patient's treatment.  Should you have any questions or concerns, please contact the [facility] at [phone].    Consult Note:  07/14/2020    COVID 19 SCREENING: The patient denies symptoms concerning for COVID-19 infection including fever, chills, cough, or newly developed shortness of breath.   HPI: Steven [name] is a very pleasant 64 y.o. male with recent diagnosis of severe aortic stenosis anticipating AVR/TAVR and h/o uncontrolled Type 2 DM, CAD, CHF, HTN, Thrombocytopenia, Heart murmur, CKD and Hyperlipidemia who presents today for a dental evaluation as part of their Pre-cardiac surgical work-up.   Dental History: The patient reports not going to the dentist in several years.  He states that he knows he has not  taken care of his teeth and that many of them are in bad shape and need to come out.  He currently denies any dental/oral pain or sensitivity. Patient able to manage oral secretions.  Patient denies dysphagia, odynophagia, dysphonia, SOB and neck pain.  Patient denies fever, rigors and malaise.   CHIEF COMPLAINT: "I can't get my heart surgery until my teeth are taken care of."   Patient Active Problem List   Diagnosis Date Noted  . Acute blood loss anemia   . Acquired absence of left leg below knee (HCC)   . Aortic valve stenosis   . Open wound of left foot 06/01/2020  . Osteomyelitis of foot ([place]) 06/01/2020  . CKD (chronic kidney disease), stage III ([place]) 06/01/2020  . Diabetic foot infection ([place])   . Chronic pain 02/28/2020  . Vitamin D deficiency 02/25/2020  . Thrombocytopenia ([place]) 02/25/2020  . Normocytic anemia 02/25/2020  . Chronic venous hypertension (idiopathic) with ulcer and inflammation of right lower extremity ([place])   . Non-pressure chronic ulcer of right heel and midfoot limited to breakdown of skin ([place])   . Diabetic polyneuropathy associated with type 2 diabetes mellitus ([place])   . Venous insufficiency   . Cellulitis of right foot 12/21/2019  . Obesity, Class III, BMI 40-49.9 (morbid obesity) ([place])   . Osteomyelitis ([place]) 07/17/2017  . Kidney transplant recipient 05/24/2015  . Diabetes mellitus ([place]) 05/10/2012  . Hyperlipidemia 05/10/2012  . Essential hypertension 05/10/2012   Past Medical History:  Diagnosis Date  . Cataract    removed by surgery  . Diabetes (  [place])    type 2  . 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 Corliss Parish.  . GERD (gastroesophageal reflux disease)   . HLD (hyperlipidemia)   . Hyperparathyroidism ([place])   . Hypertension    Patient denies this DX, no meds  . Kidney transplant recipient   . Obesity   . Sleep apnea    uses CPAP nightly  . Venous insufficiency   . Vitamin D deficiency     Past Surgical History:  Procedure Laterality Date  . AMPUTATION Right 07/19/2017   Procedure: AMPUTATION RIGHT 2ND TOE;  Surgeon: Leandrew Koyanagi, MD;  Location: [place];  Service: Orthopedics;  Laterality: Right;  . AMPUTATION Left 06/03/2020   Procedure: LEFT BELOW KNEE AMPUTATION;  Surgeon: Newt Minion, MD;  Location: [place];  Service: Orthopedics;  Laterality: Left;  . COLONOSCOPY     greater than 10 yrs ago  . EYE SURGERY Bilateral    cataracts removed  . KIDNEY TRANSPLANT  2006  . NEPHRECTOMY TRANSPLANTED ORGAN  2006  . RIGHT HEART CATH AND CORONARY ANGIOGRAPHY N/A 06/08/2020   Procedure: RIGHT HEART CATH AND CORONARY ANGIOGRAPHY;  Surgeon: Nelva Bush, MD;  Location: [place] [facility];  Service: Cardiovascular;  Laterality: N/A;  . UPPER GASTROINTESTINAL ENDOSCOPY    . WISDOM TOOTH EXTRACTION     Allergies  Allergen Reactions  . Sulfa Antibiotics Rash   Current Outpatient Medications  Medication Sig Dispense Refill  . acetaminophen-codeine (TYLENOL #3) 300-30 MG tablet Take 1 tablet by mouth every 6 (six) hours as needed for severe pain. 28 tablet 0  . aspirin EC 81 MG EC tablet Take 1 tablet (81 mg total) by mouth daily. Swallow whole. 30 tablet 11  . atorvastatin (LIPITOR) 10 MG tablet Take 1 tablet (10 mg total) by mouth daily.    . cyclobenzaprine (FLEXERIL) 5 MG tablet Take 1 tablet (5 mg total) by mouth 3 (three) times daily as needed for muscle spasms. 30 tablet 0  . docusate sodium (COLACE) 100 MG capsule Take 1 capsule (100 mg total) by mouth 2 (two) times daily. 60 capsule 0  . doxycycline (VIBRA-TABS) 100 MG tablet Take 1 tablet (100 mg total) by mouth 2 (two) times daily. 60 tablet 0  . fenofibrate (TRICOR) 48 MG tablet Take 1 tablet (48 mg total) by mouth daily.    . furosemide (LASIX) 20 MG tablet Take 1 tablet (20 mg total) by mouth daily. 30 tablet `  . glyBURIDE (DIABETA) 5 MG tablet Take 1 tablet (5 mg total) by mouth 2 (two) times daily with a meal.     . insulin aspart (NOVOLOG) 100 UNIT/ML injection Inject 0-30 Units into the skin 3 (three) times daily as needed for high blood sugar. Sliding scale 10 mL 11  . insulin detemir (LEVEMIR) 100 UNIT/ML FlexPen Inject 10 Units into the skin daily. 15 mL 1  . Insulin Pen Needle (BD PEN NEEDLE NANO 2ND GEN) 32G X 4 MM MISC Use daily for glucose control 100 each 3  . Insulin Pen Needle 32G X 4 MM MISC Use as directed with insulin pen 100 each 1  . metoprolol tartrate (LOPRESSOR) 50 MG tablet Take as directed prior to 12/16 CT scan 1 tablet 0  . mycophenolate (MYFORTIC) 360 MG TBEC EC tablet Take 1 tablet (360 mg total) by mouth 2 (two) times daily. 120 tablet   . omeprazole (PRILOSEC) 20 MG capsule Take 1 capsule (20 mg total) by  mouth daily. 90 capsule 3  . ONETOUCH VERIO test strip 1 each by Other route 3 (three) times daily. 100 each 12  . polyethylene glycol (MIRALAX / GLYCOLAX) 17 g packet Take 17 g by mouth daily as needed for mild constipation. 14 each 0  . tacrolimus (PROGRAF) 1 MG capsule Take 1 capsule (1 mg total) by mouth 2 (two) times daily.    . traZODone (DESYREL) 50 MG tablet Take 1 tablet (50 mg total) by mouth at bedtime. 30 tablet 0   No current facility-administered medications for this visit.    LABS: Lab Results  Component Value Date   WBC 3.3 (L) 07/09/2020   HGB 10.3 (L) 07/09/2020   HCT 32.1 (L) 07/09/2020   MCV 80.5 07/09/2020   PLT 123 (L) 07/09/2020      Component Value Date/Time   NA 138 07/09/2020 1155   K 4.2 07/09/2020 1155   CL 105 07/09/2020 1155   CO2 25 07/09/2020 1155   GLUCOSE 196 (H) 07/09/2020 1155   BUN 48 (H) 07/09/2020 1155   CREATININE 1.99 (H) 07/09/2020 1155   CALCIUM 9.0 07/09/2020 1155   CALCIUM 7.6 (L) 07/12/2007 0400   GFRNONAA 38 (L) 06/22/2020 0523   GFRNONAA 61 10/03/2013 1432   GFRAA 45 (L) 12/24/2019 0643   GFRAA 71 10/03/2013 1432   Lab Results  Component Value Date   INR 1.18 07/17/2017   INR 1.3 07/11/2007   No results  found for: PTT  Social History   Socioeconomic History  . Marital status: Legally Separated    Spouse name: Not on file  . Number of children: Not on file  . Years of education: Not on file  . Highest education level: Not on file  Occupational History  . Occupation: trooper  Tobacco Use  . Smoking status: Never Smoker  . Smokeless tobacco: Former Systems developer    Types: Secondary school teacher  . Vaping Use: Never used  Substance and Sexual Activity  . Alcohol use: No  . Drug use: No  . Sexual activity: Yes  Other Topics Concern  . Not on file  Social History Narrative  . Not on file   Social Determinants of Health   Financial Resource Strain: Not on file  Food Insecurity: Not on file  Transportation Needs: Not on file  Physical Activity: Not on file  Stress: Not on file  Social Connections: Not on file  Intimate Partner Violence: Not on file   Family History  Problem Relation Age of Onset  . Healthy Mother   . Healthy Father   . Colon cancer Neg Hx   . Rectal cancer Neg Hx   . Stomach cancer Neg Hx     REVIEW OF SYSTEMS: Reviewed with the patient as per HPI. PSYCH: Patient denies having dental phobia.   VITAL SIGNS: BP 138/61 (BP Location: Right Arm)   Pulse 74   Temp 98.5 F (36.9 C)    PHYSICAL EXAMINATION: GENERAL: Well-developed, comfortable and in no apparent distress. NEUROLOGICAL: Alert and oriented to person, place and time. EXTRAORAL:  Facial symmetry present without any edema or erythema.  No swelling or lymphadenopathy. TMJ without clicks or crepitations. Maximum interincisal opening: 40 mm INTRAORAL: Soft tissues appear well-perfused and mucous membranes moist.  FOM and vestibules soft and not raised. Oral cavity without mass or lesion. No signs of infection, parulis, sinus tract, edema or erythema evident upon exam. Bilateral mandibular tori, Bilateral Maxillary Exostosis in posterior regions.  DENTAL EXAMINATION: DENTITION:  Overall poor remaining  maxillary dentition, fair mandibular dentition.  Missing teeth, Generalized attrition of incisal edges of anterior teeth, existing restorations.   The patient is maintaining poor oral hygiene. PERIODONTAL: Pink, healthy gingival tissue with blunted papilla with areas of localized inflammation and erythema.  Generalized plaque and calculus accumulation. Gingival recession, Class III mobility tooth #4. DENTAL CARIES/DEFECTIVE RESTORATIONS: Multiple retained root tips, #2MOBL decay, #56F(V) caries, #9DIFL caries with fracture, #10MIFL caries with fracture, #11DFL caries, #28 recurrent decay. #2 defective amalgam restoration, #9 defective composite restoration, #28 large existing amalgam with poor marginal integrity. PROSTHODONTIC: Patient denies wearing partial dentures.  He does express interest in replacement of missing teeth in the future. OCCLUSION: Unable to assess molar occlusion. Collapsed VDO due to missing posterior teeth.  RADIOGRAPHIC EXAMINATION PAN Full Mouth Series exposed and interpreted: >>Condyles seated bilaterally in fossas.  All visualized osseous structures appear WNL. Right and Left small, radiopaque scattered regions surrounding the stylohyoid ligament consistent with carotid artery calcifications. >>Generalized mild to moderate horizontal bone loss consistent with mild to moderate periodontitis. Missing teeth, caries, existing restorations. #2 and #10 deep decay approximating the pulp. #3, #5, #6, #12, #13, #14 and #21 retained root tips, #3 previously RCT. #5, #13, #14 and #21 periapical radiolucencies. #2 and #4 supra-erupted. #9 and #10 fractured coronal tooth structure.   ASSESSMENT 1. Severe Aortic Stenosis 2. Pre-AVR/TAVR 3. CHF, CAD, Heart murmur 4. Uncontrolled Type 2 Diabetes Mellitus 5. HTN 6. Chronic kidney disease (h/o kidney transplant) 7. Thrombocytopenia 8. Hyperlipidemia 9. Missing teeth 10. Caries 11. Chronic apical periodontitis 12. Periapical  abscess w/o sinus 13. Chronic periodontitis 14. Accretions on teeth 15. Gingival recession 16. Retained dental root 17. Loose teeth 18. 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 Exostosis of jaws 21. Postoperative bleeding risk   PLAN/RECOMMENDATIONS  . I discussed the risks, benefits, and complications (including risk of systemic infection such as endocarditis or heart valve infection, oral pain and infection, and ongoing inflammatory issues d/t chronic periodontal disease) of various treatment options with the patient in relationship to his medical and dental conditions.  Based on the clinical and radiographic findings, it is recommended that the patient have several infected teeth extracted and a full mouth debridement prior to heart valve surgery.  Discussed his options for his lower anterior teeth #22-#27 including 1. Debridement of teeth and maintaining optimal oral hygiene following surgery as well as establishing care at an outside dental office of his choice for routine cleanings and exams and 2. Full mouth Extractions.  I explained how keeping his mandibular teeth that are in good shape right now and maintaining them is a more ideal option, if he is compliant, so he can have a partial denture made in the future with good retention as opposed to a complete lower denture.  Explained to patient that all of his upper teeth do need to be extracted and he can have a complete Maxillary Denture fabricated in 4-6 weeks following surgery if he desires.  . We discussed various treatment options to include no treatment, multiple extractions with alveoloplasty, pre-prosthetic surgery as indicated, periodontal therapy, dental restorations, crown and bridge therapy, implant therapy, and replacement of missing teeth as indicated. The patient verbalized understanding of all options, and currently wishes to proceed with Extractions of all Maxillary teeth and indicated Mandibular teeth,  Debridement of his remaining mandibular dentition, Alveoloplasty and Pre-Prosthetic surgery as needed in the OR under general anesthesia.  . Discussion of findings with medical team and coordination of  future medical and dental care as needed.  . The patient elected not to proceed with scheduling his OR date at this time since the holidays are coming up and he has another appointment on Thursday which may indicate he can wait a little longer prior to having heart surgery.  I told patient that I would inform Dr. Burt Knack of the current plan and recommendations, and we will move forward with scheduling in the near future.   The patient tolerated today's visit well.  All questions and concerns were addressed and the patient departed in stable condition.   I spent in excess of 120 minutes during the conduct of this consultation and >50% of this time involved direct face-to-face encounter for counseling and/or coordination of the patient's care.  [place] Benson Norway, DMD

## 2020-07-14 NOTE — Patient Instructions (Signed)
[facility] Department of Dental Medicine Dr. Debe Coder B. Nimrit Kehres Phone: [phone] Fax: [phone]    It was a pleasure seeing you today!  Please refer to the information below regarding your dental visit, and call us should you have any questions or concerns that may come up after you leave.   Thank you for giving [place] the opportunity to provide care for you.  If there is anything we can do for you, please let us know.     HEART VALVES AND MOUTH CARE  FACTS:   If you have any infection in your mouth, it can infect your heart valve.  If you heart valve is infected, you will be seriously ill.  Infections in the mouth can be SILENT and do not always cause pain.  Examples of infections in the mouth are gum disease, dental cavities, and abscesses.  Some possible signs of infection are: Bad breath, bleeding gums, or teeth that are sensitive to sweets, hot, and/or cold. There are many other signs as well.  WHAT YOU HAVE TO DO:   Brush your teeth after meals and at bedtime. Spend at least 2 minutes brushing well, especially behind your back teeth and all around your teeth that stand alone. Brush at the gumline also.  Do not go to bed without brushing your teeth and flossing.  If your gums bleed when you brush or floss, do NOT stop brushing or flossing.  Bleeding can be a sign of inflammation or irritation from bacteria.  It usually means that your gums need more attention and better cleaning.   If your Dentist or Dr. Benson Norway gave you a prescription mouthwash to use, make sure to use it as directed. If you run out of the medication, get a refill at the pharmacy.  If you were given any other medications or directions by your Dentist, please follow them. If you did not understand the directions or forget what you were told, please call. We will be happy to refresh your memory.  If you need antibiotics before dental procedures, make sure you take them one hour prior to every dental  visit as directed.   Get a dental check-up every 4-6 months in order to keep your mouth healthy, or to find and treat any new infection. You will most likely need your teeth cleaned or gums treated at the same time.  If you are not able to come in for your scheduled appointment, call your Dentist as soon as possible to reschedule.  If you have a problem in between dental visits, call your Dentist.    WE ARE A TEAM - OUR GOAL IS:  HEALTHY MOUTH, HEALTHY HEART

## 2020-07-16 ENCOUNTER — Other Ambulatory Visit: Payer: Self-pay | Admitting: Physical Medicine and Rehabilitation

## 2020-07-16 ENCOUNTER — Other Ambulatory Visit: Payer: Self-pay

## 2020-07-16 ENCOUNTER — Ambulatory Visit (HOSPITAL_COMMUNITY)
Admission: RE | Admit: 2020-07-16 | Discharge: 2020-07-16 | Disposition: A | Discharge status: Home / Self Care | Payer: BC Managed Care – PPO | Source: Ambulatory Visit | Attending: Cardiovascular Disease | Admitting: Cardiovascular Disease

## 2020-07-16 DIAGNOSIS — I350 Nonrheumatic aortic (valve) stenosis: Secondary | ICD-10-CM

## 2020-07-16 LAB — BASIC METABOLIC PANEL
Anion gap: 9 (ref 5–15)
BUN: 38 mg/dL — ABNORMAL HIGH (ref 8–23)
CO2: 23 mmol/L (ref 22–32)
Calcium: 9.1 mg/dL (ref 8.9–10.3)
Chloride: 107 mmol/L (ref 98–111)
Creatinine, Ser: 1.99 mg/dL — ABNORMAL HIGH (ref 0.61–1.24)
GFR, Estimated: 37 mL/min — ABNORMAL LOW (ref >60)
Glucose, Bld: 257 mg/dL — ABNORMAL HIGH (ref 70–99)
Potassium: 4.3 mmol/L (ref 3.5–5.1)
Sodium: 139 mmol/L (ref 135–145)

## 2020-07-16 MED ORDER — IOHEXOL 350 MG/ML SOLN
100.0000 mL | Freq: Once | INTRAVENOUS | Status: AC | PRN
  Administered 2020-07-16: 12:00:00 100 mL via INTRAVENOUS

## 2020-07-16 MED ORDER — SODIUM CHLORIDE 0.9 % WEIGHT BASED INFUSION: 1.0000 mL/kg/h | INTRAVENOUS | Status: DC

## 2020-07-16 MED ORDER — SODIUM CHLORIDE 0.9 % WEIGHT BASED INFUSION
3.0000 mL/kg/h | INTRAVENOUS | Status: AC
  Administered 2020-07-16: 09:00:00 3 mL/kg/h via INTRAVENOUS

## 2020-07-16 NOTE — Progress Notes (Signed)
Notified Jaci Standard in CT that 1 hour IVF will be complete by 0950.

## 2020-07-20 ENCOUNTER — Telehealth: Payer: Self-pay | Admitting: Cardiovascular Disease

## 2020-07-20 NOTE — Telephone Encounter (Signed)
New Message  Pt is calling and is requesting his CT results  He is wondering what the next plan of care will be   Please call

## 2020-07-20 NOTE — Telephone Encounter (Signed)
I spoke with the pt in regards to CT scans and advised that he will need to meet with a cardiac surgeon on the Multidisciplinary Valve Team to further discuss plan of TAVR vs SAVR.  I did advise the pt that he will need to proceed with dental extraction and recovery before the AV can be treated.  As a next step the pt will contact Dr Benson Norway and arrange date for extraction. Once extraction has been booked then I will arrange evaluation with cardiac surgeon. Pt agreed with plan.

## 2020-07-22 ENCOUNTER — Other Ambulatory Visit: Payer: Self-pay

## 2020-07-22 DIAGNOSIS — Z940 Kidney transplant status: Secondary | ICD-10-CM

## 2020-07-22 DIAGNOSIS — I350 Nonrheumatic aortic (valve) stenosis: Secondary | ICD-10-CM

## 2020-07-23 ENCOUNTER — Other Ambulatory Visit: Payer: BC Managed Care – PPO

## 2020-07-27 ENCOUNTER — Other Ambulatory Visit: Payer: BC Managed Care – PPO | Admitting: *Deleted

## 2020-07-27 ENCOUNTER — Other Ambulatory Visit: Payer: Self-pay

## 2020-07-27 DIAGNOSIS — I350 Nonrheumatic aortic (valve) stenosis: Secondary | ICD-10-CM

## 2020-07-27 DIAGNOSIS — Z940 Kidney transplant status: Secondary | ICD-10-CM

## 2020-07-27 LAB — BASIC METABOLIC PANEL
BUN/Creatinine Ratio: 23 (ref 10–24)
BUN: 37 mg/dL — ABNORMAL HIGH (ref 8–27)
CO2: 19 mmol/L — ABNORMAL LOW (ref 20–29)
Calcium: 9 mg/dL (ref 8.6–10.2)
Chloride: 101 mmol/L (ref 96–106)
Creatinine, Ser: 1.61 mg/dL — ABNORMAL HIGH (ref 0.76–1.27)
GFR calc Af Amer: 52 mL/min/{1.73_m2} — ABNORMAL LOW (ref >59)
GFR calc non Af Amer: 45 mL/min/{1.73_m2} — ABNORMAL LOW (ref >59)
Glucose: 174 mg/dL — ABNORMAL HIGH (ref 65–99)
Potassium: 4.1 mmol/L (ref 3.5–5.2)
Sodium: 137 mmol/L (ref 134–144)

## 2020-07-28 ENCOUNTER — Telehealth: Payer: Self-pay

## 2020-07-28 ENCOUNTER — Telehealth: Payer: Self-pay | Admitting: Cardiovascular Disease

## 2020-07-28 NOTE — Telephone Encounter (Signed)
Patient is returning call to review lab results.  

## 2020-07-28 NOTE — Telephone Encounter (Signed)
Please call pt and make appt. We have never treated him for this.

## 2020-07-28 NOTE — Telephone Encounter (Signed)
Returned call to pt and reviewed lab results. He verbalized his understanding and appreciation for the call.

## 2020-07-28 NOTE — Telephone Encounter (Signed)
Patient called he is requesting a rx for swelling in his left elbow joint, he stated he has burcitis. [phone]

## 2020-07-29 ENCOUNTER — Encounter: Payer: Self-pay | Admitting: Physician Assistant

## 2020-07-29 ENCOUNTER — Ambulatory Visit (INDEPENDENT_AMBULATORY_CARE_PROVIDER_SITE_OTHER): Payer: BC Managed Care – PPO | Admitting: Physician Assistant

## 2020-07-29 DIAGNOSIS — M86072 Acute hematogenous osteomyelitis, left ankle and foot: Secondary | ICD-10-CM

## 2020-07-29 MED ORDER — ACETAMINOPHEN-CODEINE #3 300-30 MG PO TABS
1.0000 | ORAL_TABLET | Freq: Three times a day (TID) | ORAL | 0 refills | Status: DC | PRN
Start: 2020-07-29 — End: 2021-01-14

## 2020-07-29 MED ORDER — TRAMADOL HCL 50 MG PO TABS: 50.0000 mg | ORAL_TABLET | Freq: Two times a day (BID) | ORAL | 0 refills | Status: AC | PRN

## 2020-07-29 NOTE — Progress Notes (Signed)
Office Visit Note   Patient: Steven [name]           Date of Birth: [birth year]           MRN: [id number] Visit Date: 07/29/2020              Requested by: Isaac Bliss, Rayford Halsted, MD [place],  [place] [id number] PCP: Isaac Bliss, Rayford Halsted, MD  No chief complaint on file.     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  Overall he is doing better.  He is still on antibiotics.  He still has some small areas of drainage and dehiscence but his wife is noted that there is much less erythema and no more swelling he is also complaining of recurrent bursitis he has in his left elbow and he is left elbow dominant.  This has been going on and off for about a year he thinks that reoccurred because he has been having to use his arm to push out of the wheelchair  Assessment & Plan: Visit Diagnoses: No diagnosis found.  Plan: Given him a prescription for prosthetic and new shrinkers.  Have given him a compression sleeve for his elbow.  We will follow up in 2 weeks Patient is a new left transtibial  amputee.  [name] current comorbidities are not expected to impact the ability to function with the prescribed prosthesis. Patient verbally communicates a strong desire to use a prosthesis. Patient currently requires mobility aids to ambulate without a prosthesis.  Expects not to use mobility aids with a new prosthesis.  Patient is a K2 level ambulator that will use a prosthesis to walk around their home and the community over low level environmental barriers.   Follow-Up Instructions: No follow-ups on file.   Ortho Exam  Patient is alert, oriented, no adenopathy, well-dressed, normal affect, normal respiratory effort. Amputation stump overall well healing there is still some areas of eschar but no foul odor no cellulitis he has almost no swelling.  Left elbow no signs of infected olecranon bursa.  Skin is intact without swelling warmth or erythema.   He does have a prominent olecranon bursa.  Imaging: No results found. No images are attached to the encounter.  Labs: Lab Results  Component Value Date   HGBA1C 9.3 (H) 06/01/2020   HGBA1C 9.4 (H) 12/21/2019   HGBA1C 9.3 (H) 07/17/2017   ESRSEDRATE 40 (H) 07/17/2017   CRP 11.1 (H) 07/17/2017   REPTSTATUS 06/06/2020 FINAL 06/01/2020   REPTSTATUS 06/06/2020 FINAL 06/01/2020   CULT  06/01/2020    NO GROWTH 5 DAYS Performed at [facility], [place] [address], [place], [place] [address]    CULT  06/01/2020    NO GROWTH 5 DAYS Performed at [facility] [address], [place], [place] [address]      Lab Results  Component Value Date   ALBUMIN 2.5 (L) 06/11/2020   ALBUMIN 3.0 (L) 06/03/2020   ALBUMIN 3.3 (L) 06/01/2020   PREALBUMIN 16.1 (L) 07/17/2017    Lab Results  Component Value Date   MG 1.7 06/03/2020   MG 1.5 (L) 07/18/2017   MG 1.5 07/12/2007   Lab Results  Component Value Date   VD25OH 18 (L) 02/21/2020    Lab Results  Component Value Date   PREALBUMIN 16.1 (L) 07/17/2017   CBC EXTENDED Latest Ref Rng & Units 07/09/2020 06/22/2020 06/15/2020  WBC 3.8 - 10.8 Thousand/uL 3.3(L) 2.7(L) 3.7(L)  RBC 4.20 -  5.80 Million/uL 3.99(L) 3.29(L) 3.46(L)  HGB 13.2 - 17.1 g/dL 10.3(L) 8.4(L) 8.7(L)  HCT 38.5 - 50.0 % 32.1(L) 27.4(L) 28.3(L)  PLT 140 - 400 Thousand/uL 123(L) 138(L) 157  NEUTROABS 1,500 - 7,800 cells/uL 2,485 - -  LYMPHSABS 850 - 3,900 cells/uL 528(L) - -     There is no height or weight on file to calculate BMI.  Orders:  No orders of the defined types were placed in this encounter.  No orders of the defined types were placed in this encounter.    Procedures: No procedures performed  Clinical Data: No additional findings.  ROS:  All other systems negative, except as noted in the HPI. Review of Systems  Objective: Vital Signs: There were no vitals taken for this visit.  Specialty Comments:  No specialty comments  available.  PMFS History: Patient Active Problem List   Diagnosis Date Noted  . Acute blood loss anemia   . Acquired absence of left leg below knee (HCC)   . Aortic valve stenosis   . Open wound of left foot 06/01/2020  . Osteomyelitis of foot ([place]) 06/01/2020  . CKD (chronic kidney disease), stage III ([place]) 06/01/2020  . Diabetic foot infection ([place])   . Chronic pain 02/28/2020  . Vitamin D deficiency 02/25/2020  . Thrombocytopenia ([place]) 02/25/2020  . Normocytic anemia 02/25/2020  . Chronic venous hypertension (idiopathic) with ulcer and inflammation of right lower extremity ([place])   . Non-pressure chronic ulcer of right heel and midfoot limited to breakdown of skin ([place])   . Diabetic polyneuropathy associated with type 2 diabetes mellitus ([place])   . Venous insufficiency   . Cellulitis of right foot 12/21/2019  . Obesity, Class III, BMI 40-49.9 (morbid obesity) ([place])   . Osteomyelitis ([place]) 07/17/2017  . Kidney transplant recipient 05/24/2015  . Diabetes mellitus ([place]) 05/10/2012  . Hyperlipidemia 05/10/2012  . Essential hypertension 05/10/2012   Past Medical History:  Diagnosis Date  . Cataract    removed by surgery  . Diabetes ([place])    type 2  . 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 Corliss Parish.  . GERD (gastroesophageal reflux disease)   . HLD (hyperlipidemia)   . Hyperparathyroidism ([place])   . Hypertension    Patient denies this DX, no meds  . Kidney transplant recipient   . Obesity   . Sleep apnea    uses CPAP nightly  . Venous insufficiency   . Vitamin D deficiency     Family History  Problem Relation Age of Onset  . Healthy Mother   . Healthy Father   . Colon cancer Neg Hx   . Rectal cancer Neg Hx   . Stomach cancer Neg Hx     Past Surgical History:  Procedure Laterality Date  . AMPUTATION Right 07/19/2017   Procedure: AMPUTATION RIGHT 2ND TOE;  Surgeon: Leandrew Koyanagi, MD;  Location: [facility];  Service:  Orthopedics;  Laterality: Right;  . AMPUTATION Left 06/03/2020   Procedure: LEFT BELOW KNEE AMPUTATION;  Surgeon: Newt Minion, MD;  Location: [facility];  Service: Orthopedics;  Laterality: Left;  . COLONOSCOPY     greater than 10 yrs ago  . EYE SURGERY Bilateral    cataracts removed  . KIDNEY TRANSPLANT  2006  . NEPHRECTOMY TRANSPLANTED ORGAN  2006  . RIGHT HEART CATH AND CORONARY ANGIOGRAPHY N/A 06/08/2020   Procedure: RIGHT HEART CATH AND CORONARY ANGIOGRAPHY;  Surgeon: Nelva Bush, MD;  Location: [facility];  Service: Cardiovascular;  Laterality: N/A;  . UPPER GASTROINTESTINAL ENDOSCOPY    . WISDOM TOOTH EXTRACTION     Social History   Occupational History  . Occupation: trooper  Tobacco Use  . Smoking status: Never Smoker  . Smokeless tobacco: Former Systems developer    Types: Secondary school teacher  . Vaping Use: Never used  Substance and Sexual Activity  . Alcohol use: No  . Drug use: No  . Sexual activity: Yes

## 2020-08-04 ENCOUNTER — Other Ambulatory Visit: Payer: Self-pay

## 2020-08-04 ENCOUNTER — Other Ambulatory Visit (HOSPITAL_COMMUNITY)
Admission: RE | Admit: 2020-08-04 | Discharge: 2020-08-04 | Disposition: A | Discharge status: Home / Self Care | Payer: BC Managed Care – PPO | Source: Ambulatory Visit | Attending: Dentistry | Admitting: Dentistry

## 2020-08-04 ENCOUNTER — Encounter (HOSPITAL_COMMUNITY): Payer: Self-pay | Admitting: Dentistry

## 2020-08-04 DIAGNOSIS — N2581 Secondary hyperparathyroidism of renal origin: Secondary | ICD-10-CM | POA: Diagnosis not present

## 2020-08-04 DIAGNOSIS — Z940 Kidney transplant status: Secondary | ICD-10-CM | POA: Diagnosis not present

## 2020-08-04 DIAGNOSIS — Z89421 Acquired absence of other right toe(s): Secondary | ICD-10-CM | POA: Diagnosis not present

## 2020-08-04 DIAGNOSIS — Z20822 Contact with and (suspected) exposure to covid-19: Secondary | ICD-10-CM | POA: Insufficient documentation

## 2020-08-04 DIAGNOSIS — N269 Renal sclerosis, unspecified: Secondary | ICD-10-CM | POA: Diagnosis not present

## 2020-08-04 DIAGNOSIS — D696 Thrombocytopenia, unspecified: Secondary | ICD-10-CM | POA: Diagnosis not present

## 2020-08-04 DIAGNOSIS — D631 Anemia in chronic kidney disease: Secondary | ICD-10-CM | POA: Diagnosis not present

## 2020-08-04 DIAGNOSIS — I2510 Atherosclerotic heart disease of native coronary artery without angina pectoris: Secondary | ICD-10-CM | POA: Diagnosis not present

## 2020-08-04 DIAGNOSIS — I509 Heart failure, unspecified: Secondary | ICD-10-CM | POA: Diagnosis not present

## 2020-08-04 DIAGNOSIS — E1122 Type 2 diabetes mellitus with diabetic chronic kidney disease: Secondary | ICD-10-CM | POA: Diagnosis not present

## 2020-08-04 DIAGNOSIS — E1169 Type 2 diabetes mellitus with other specified complication: Secondary | ICD-10-CM | POA: Diagnosis not present

## 2020-08-04 DIAGNOSIS — K056 Periodontal disease, unspecified: Secondary | ICD-10-CM | POA: Diagnosis not present

## 2020-08-04 DIAGNOSIS — E1151 Type 2 diabetes mellitus with diabetic peripheral angiopathy without gangrene: Secondary | ICD-10-CM | POA: Diagnosis not present

## 2020-08-04 DIAGNOSIS — Z89512 Acquired absence of left leg below knee: Secondary | ICD-10-CM | POA: Diagnosis not present

## 2020-08-04 DIAGNOSIS — Z882 Allergy status to sulfonamides status: Secondary | ICD-10-CM | POA: Diagnosis not present

## 2020-08-04 DIAGNOSIS — E11621 Type 2 diabetes mellitus with foot ulcer: Secondary | ICD-10-CM | POA: Diagnosis not present

## 2020-08-04 DIAGNOSIS — M869 Osteomyelitis, unspecified: Secondary | ICD-10-CM | POA: Diagnosis not present

## 2020-08-04 DIAGNOSIS — Z794 Long term (current) use of insulin: Secondary | ICD-10-CM | POA: Diagnosis not present

## 2020-08-04 DIAGNOSIS — R011 Cardiac murmur, unspecified: Secondary | ICD-10-CM | POA: Diagnosis not present

## 2020-08-04 DIAGNOSIS — N186 End stage renal disease: Secondary | ICD-10-CM | POA: Diagnosis not present

## 2020-08-04 DIAGNOSIS — Z79899 Other long term (current) drug therapy: Secondary | ICD-10-CM | POA: Diagnosis not present

## 2020-08-04 DIAGNOSIS — Z7982 Long term (current) use of aspirin: Secondary | ICD-10-CM | POA: Diagnosis not present

## 2020-08-04 DIAGNOSIS — K029 Dental caries, unspecified: Secondary | ICD-10-CM | POA: Diagnosis present

## 2020-08-04 DIAGNOSIS — I350 Nonrheumatic aortic (valve) stenosis: Secondary | ICD-10-CM | POA: Diagnosis not present

## 2020-08-04 DIAGNOSIS — Z01818 Encounter for other preprocedural examination: Secondary | ICD-10-CM | POA: Insufficient documentation

## 2020-08-04 LAB — SARS CORONAVIRUS 2 (TAT 6-24 HRS): SARS Coronavirus 2: NEGATIVE

## 2020-08-04 NOTE — Progress Notes (Signed)
Steven [name] denies chest pain or shortness of breath.  Patient was tested for Covid today and has been in quarantine since tested.  Steven [name] has type II diabetes. Patient reports that CBGs run in the low 100's. I instructed Steven [name] to not take evening dose of Diabeta Wednesday or Thursday am. I instructed patient that if CBG is greater than 70, that he may take 6 units of Levemir.  I instructed patient that if CBG is greater than 220, to take 1/2 of Novolog dose. I instructed patient to check CBG after awaking and every 2 hours until arrival  to the hospital.  I Instructed patient if CBG is less than 70 to drink  1/2 cup of a clear juice. Recheck CBG in 15 minutes then call pre- op desk at [phone] for further instructions. If scheduled to receive Insulin, do not take Insulin

## 2020-08-05 MED ORDER — DEXTROSE 5 % IV SOLN
3.0000 g | INTRAVENOUS | Status: DC
  Filled 2020-08-05: qty 3000

## 2020-08-05 NOTE — Anesthesia Preprocedure Evaluation (Signed)
Anesthesia Evaluation  Patient identified by MRN, date of birth, ID band Patient awake    Reviewed: Allergy & Precautions, NPO status , [name] Chart, lab work & pertinent test results, reviewed documented beta blocker date and time   Airway Mallampati: I  TM Distance: >3 FB Neck ROM: Full    Dental  (+) Chipped, Missing, Dental Advisory Given, Poor Dentition,    Pulmonary sleep apnea and Continuous Positive Airway Pressure Ventilation ,    Pulmonary exam normal breath sounds clear to auscultation       Cardiovascular hypertension, Pt. on home beta blockers and Pt. on medications Normal cardiovascular exam+ Valvular Problems/Murmurs AS  Rhythm:Regular Rate:Normal + Systolic murmurs TTE 9528 1. Severely calcified aortic and mitral valves with severe aortic stenosis and mild mitral stenosis.  2. Left ventricular ejection fraction, by estimation, is 60 to 65%. The left ventricle has normal function. The left ventricle has no regional wall motion abnormalities. There is moderate concentric left ventricular hypertrophy. Left ventricular diastolic parameters are consistent with Grade II diastolic dysfunction (pseudonormalization). Elevated left atrial pressure.  3. Right ventricular systolic function is normal. The right ventricular size is normal.  4. The mitral valve is normal in structure. Mild mitral valve  regurgitation. Mild mitral stenosis. Moderate mitral annular  calcification.  5. The aortic valve is calcified. There is severe calcifcation of the aortic valve. There is severe thickening of the aortic valve. Aortic valve regurgitation is not visualized. Severe aortic valve stenosis. Aortic  valve mean gradient measures 67.0 mmHg.  6. The inferior vena cava is normal in size with greater than 50% respiratory variability, suggesting right atrial pressure of 3 mmHg.  LHC 2021 1.Mild to moderate, non-obstructive coronary artery  disease with 30-40% mid LAD stenosis.  No significant disease identified in the LCx or RCA. 2.Moderately to severely elevated left heart, right heart, and pulmonary artery pressures. 3.Normal cardiac output/index.     Neuro/Psych negative neurological ROS  negative psych ROS   GI/Hepatic Neg liver ROS, GERD  Controlled and Medicated,  Endo/Other  negative endocrine ROSdiabetes, Type 2, Oral Hypoglycemic Agents, Insulin Dependent  Renal/GU Renal InsufficiencyRenal disease (Cr 1.61, K 4.1, s/p transplant 2006)  negative genitourinary   Musculoskeletal negative musculoskeletal ROS (+)   Abdominal   Peds  Hematology  (+) Blood dyscrasia (Hgb 10.6), anemia ,   Anesthesia Other Findings  pre-TAVR dental extractions  Reproductive/Obstetrics                          Anesthesia Physical Anesthesia Plan  ASA: III  Anesthesia Plan: General   Post-op Pain Management:    Induction: Intravenous  PONV Risk Score and Plan: 2 and Midazolam, Dexamethasone and Ondansetron  Airway Management Planned: Nasal ETT  Additional Equipment: Arterial line  Intra-op Plan:   Post-operative Plan: Extubation in OR  Informed Consent: I have reviewed the patients History and Physical, chart, labs and discussed the procedure including the risks, benefits and alternatives for the proposed anesthesia with the patient or authorized representative who has indicated his/her understanding and acceptance.     Dental advisory given  Plan Discussed with: CRNA  Anesthesia Plan Comments: ( )      Anesthesia Quick Evaluation

## 2020-08-05 NOTE — Progress Notes (Signed)
Anesthesia Chart Review: Same-day work-up  History of ESRD, received renal transplant 2006 and is followed by nephrologist Dr. [name].   He was hospitalized in November 2021 with a nonhealing diabetic foot ulcer, failing outpatient antibiotics.  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  He then developed postoperative volume overload with acute diastolic heart failure requiring IV furosemide.  BNP was elevated and chest x-ray demonstrated interstitial edema consistent with congestive heart failure.  An echocardiogram was performed and the patient was found to have very severe aortic stenosis with a mean transvalvular gradient greater than 60 mmHg.  He was seen in formal consultation during his hospital stay on June 05, 2020.  At that time he had improved with IV diuretic therapy and was felt to be stable to continue with his postoperative rehab.  He did undergo right and left heart catheterization and echo studies while he was in the hospital.  His echocardiogram is copied below and showed findings consistent with very severe aortic stenosis and preserved LV systolic function.  His cardiac catheterization demonstrated nonobstructive coronary artery disease with elevated right and left heart filling pressures.  Patient now requires pre-TAVR dental extractions.  OSA on CPAP.  Poorly controlled DM2 last A1c 9.3 on 06/01/2020.  BMP 07/27/2020 reviewed, creatinine mildly elevated 1.61 consistent with history of CKD, otherwise unremarkable.  CBC 07/09/2020 reviewed, mild anemia with hemoglobin 10.3, platelets mildly low at 123, otherwise unremarkable.  Will need day of surgery labs and eval  Right heart cath and coronary angiography 06/08/2020: Conclusions: 1. Mild to moderate, non-obstructive coronary artery disease with 30-40% mid LAD stenosis.  No significant disease identified in the LCx or RCA. 2. Moderately to severely elevated left heart, right  heart, and pulmonary artery pressures. 3. Normal cardiac output/index.  Recommendations: 1. Ongoing workup of severe aortic stenosis per valve team. 2. Maintain net even to slightly negative fluid balance today.  If renal function remains stable, careful escalation of diuresis is recommended. 3. Medical therapy and risk factor modification to prevent progression of coronary artery disease.  TTE 06/04/2020: 1. Severely calcified aortic and mitral valves with severe aortic  stenosis and mild mitral stenosis.  2. Left ventricular ejection fraction, by estimation, is 60 to 65%. The  left ventricle has normal function. The left ventricle has no regional  wall motion abnormalities. There is moderate concentric left ventricular  hypertrophy. Left ventricular  diastolic parameters are consistent with Grade II diastolic dysfunction  (pseudonormalization). Elevated left atrial pressure.  3. Right ventricular systolic function is normal. The right ventricular  size is normal.  4. The mitral valve is normal in structure. Mild mitral valve  regurgitation. Mild mitral stenosis. Moderate mitral annular  calcification.  5. The aortic valve is calcified. There is severe calcifcation of the  aortic valve. There is severe thickening of the aortic valve. Aortic valve  regurgitation is not visualized. Severe aortic valve stenosis. Aortic  valve mean gradient measures 67.0  mmHg.  6. The inferior vena cava is normal in size with greater than 50%  respiratory variability, suggesting right atrial pressure of 3 mmHg.    Wynonia Musty [facility] Short Stay [facility]/Anesthesiology Phone [phone] 08/05/2020 1:00 PM

## 2020-08-06 ENCOUNTER — Ambulatory Visit (HOSPITAL_COMMUNITY): Payer: BC Managed Care – PPO | Admitting: Physician Assistant

## 2020-08-06 ENCOUNTER — Other Ambulatory Visit: Payer: Self-pay

## 2020-08-06 ENCOUNTER — Ambulatory Visit (HOSPITAL_COMMUNITY)
Admission: RE | Admit: 2020-08-06 | Discharge: 2020-08-06 | Disposition: A | Discharge status: Home / Self Care | Payer: BC Managed Care – PPO | Attending: Dentistry | Admitting: Dentistry

## 2020-08-06 ENCOUNTER — Encounter (HOSPITAL_COMMUNITY)
Admission: RE | Disposition: A | Discharge status: Home / Self Care | Payer: Self-pay | Source: Home / Self Care | Attending: Dentistry

## 2020-08-06 ENCOUNTER — Encounter (HOSPITAL_COMMUNITY): Payer: Self-pay | Admitting: Dentistry

## 2020-08-06 DIAGNOSIS — N2581 Secondary hyperparathyroidism of renal origin: Secondary | ICD-10-CM | POA: Insufficient documentation

## 2020-08-06 DIAGNOSIS — Z7982 Long term (current) use of aspirin: Secondary | ICD-10-CM | POA: Insufficient documentation

## 2020-08-06 DIAGNOSIS — E1122 Type 2 diabetes mellitus with diabetic chronic kidney disease: Secondary | ICD-10-CM | POA: Insufficient documentation

## 2020-08-06 DIAGNOSIS — K047 Periapical abscess without sinus: Secondary | ICD-10-CM

## 2020-08-06 DIAGNOSIS — K056 Periodontal disease, unspecified: Secondary | ICD-10-CM | POA: Insufficient documentation

## 2020-08-06 DIAGNOSIS — E1169 Type 2 diabetes mellitus with other specified complication: Secondary | ICD-10-CM | POA: Insufficient documentation

## 2020-08-06 DIAGNOSIS — E11621 Type 2 diabetes mellitus with foot ulcer: Secondary | ICD-10-CM | POA: Insufficient documentation

## 2020-08-06 DIAGNOSIS — E1142 Type 2 diabetes mellitus with diabetic polyneuropathy: Secondary | ICD-10-CM | POA: Insufficient documentation

## 2020-08-06 DIAGNOSIS — D696 Thrombocytopenia, unspecified: Secondary | ICD-10-CM | POA: Insufficient documentation

## 2020-08-06 DIAGNOSIS — K0889 Other specified disorders of teeth and supporting structures: Secondary | ICD-10-CM

## 2020-08-06 DIAGNOSIS — K083 Retained dental root: Secondary | ICD-10-CM

## 2020-08-06 DIAGNOSIS — E1151 Type 2 diabetes mellitus with diabetic peripheral angiopathy without gangrene: Secondary | ICD-10-CM | POA: Insufficient documentation

## 2020-08-06 DIAGNOSIS — Z89421 Acquired absence of other right toe(s): Secondary | ICD-10-CM | POA: Insufficient documentation

## 2020-08-06 DIAGNOSIS — R011 Cardiac murmur, unspecified: Secondary | ICD-10-CM | POA: Insufficient documentation

## 2020-08-06 DIAGNOSIS — N269 Renal sclerosis, unspecified: Secondary | ICD-10-CM | POA: Insufficient documentation

## 2020-08-06 DIAGNOSIS — Z89512 Acquired absence of left leg below knee: Secondary | ICD-10-CM | POA: Insufficient documentation

## 2020-08-06 DIAGNOSIS — E785 Hyperlipidemia, unspecified: Secondary | ICD-10-CM | POA: Insufficient documentation

## 2020-08-06 DIAGNOSIS — K0530 Chronic periodontitis, unspecified: Secondary | ICD-10-CM

## 2020-08-06 DIAGNOSIS — K029 Dental caries, unspecified: Secondary | ICD-10-CM | POA: Insufficient documentation

## 2020-08-06 DIAGNOSIS — Z20822 Contact with and (suspected) exposure to covid-19: Secondary | ICD-10-CM | POA: Insufficient documentation

## 2020-08-06 DIAGNOSIS — M869 Osteomyelitis, unspecified: Secondary | ICD-10-CM | POA: Insufficient documentation

## 2020-08-06 DIAGNOSIS — D631 Anemia in chronic kidney disease: Secondary | ICD-10-CM | POA: Insufficient documentation

## 2020-08-06 DIAGNOSIS — Z6832 Body mass index (BMI) 32.0-32.9, adult: Secondary | ICD-10-CM | POA: Insufficient documentation

## 2020-08-06 DIAGNOSIS — Z882 Allergy status to sulfonamides status: Secondary | ICD-10-CM | POA: Insufficient documentation

## 2020-08-06 DIAGNOSIS — N186 End stage renal disease: Secondary | ICD-10-CM | POA: Insufficient documentation

## 2020-08-06 DIAGNOSIS — I509 Heart failure, unspecified: Secondary | ICD-10-CM | POA: Insufficient documentation

## 2020-08-06 DIAGNOSIS — I2510 Atherosclerotic heart disease of native coronary artery without angina pectoris: Secondary | ICD-10-CM | POA: Insufficient documentation

## 2020-08-06 DIAGNOSIS — Z794 Long term (current) use of insulin: Secondary | ICD-10-CM | POA: Insufficient documentation

## 2020-08-06 DIAGNOSIS — M270 Developmental disorders of jaws: Secondary | ICD-10-CM

## 2020-08-06 DIAGNOSIS — I350 Nonrheumatic aortic (valve) stenosis: Secondary | ICD-10-CM | POA: Insufficient documentation

## 2020-08-06 DIAGNOSIS — Z79899 Other long term (current) drug therapy: Secondary | ICD-10-CM | POA: Insufficient documentation

## 2020-08-06 DIAGNOSIS — K045 Chronic apical periodontitis: Secondary | ICD-10-CM

## 2020-08-06 DIAGNOSIS — Z940 Kidney transplant status: Secondary | ICD-10-CM | POA: Insufficient documentation

## 2020-08-06 DIAGNOSIS — I132 Hypertensive heart and chronic kidney disease with heart failure and with stage 5 chronic kidney disease, or end stage renal disease: Secondary | ICD-10-CM | POA: Insufficient documentation

## 2020-08-06 DIAGNOSIS — E559 Vitamin D deficiency, unspecified: Secondary | ICD-10-CM | POA: Insufficient documentation

## 2020-08-06 HISTORY — DX: Drug induced constipation: K59.03

## 2020-08-06 HISTORY — PX: TOOTH EXTRACTION: SHX859

## 2020-08-06 HISTORY — DX: Cardiac murmur, unspecified: R01.1

## 2020-08-06 HISTORY — DX: Adverse effect of other opioids, initial encounter: T40.2X5A

## 2020-08-06 HISTORY — DX: Bursopathy, unspecified: M71.9

## 2020-08-06 LAB — CBC
HCT: 35.6 % — ABNORMAL LOW (ref 39.0–52.0)
Hemoglobin: 10.6 g/dL — ABNORMAL LOW (ref 13.0–17.0)
MCH: 24.9 pg — ABNORMAL LOW (ref 26.0–34.0)
MCHC: 29.8 g/dL — ABNORMAL LOW (ref 30.0–36.0)
MCV: 83.8 fL (ref 80.0–100.0)
Platelets: 121 10*3/uL — ABNORMAL LOW (ref 150–400)
RBC: 4.25 MIL/uL (ref 4.22–5.81)
RDW: 16.1 % — ABNORMAL HIGH (ref 11.5–15.5)
WBC: 4 10*3/uL (ref 4.0–10.5)
nRBC: 0 % (ref 0.0–0.2)

## 2020-08-06 LAB — GLUCOSE, CAPILLARY
Glucose-Capillary: 211 mg/dL — ABNORMAL HIGH (ref 70–99)
Glucose-Capillary: 213 mg/dL — ABNORMAL HIGH (ref 70–99)

## 2020-08-06 SURGERY — DENTAL RESTORATION/EXTRACTIONS
Anesthesia: General | Site: Mouth

## 2020-08-06 MED ORDER — DEXTROSE 5 % IV SOLN
INTRAVENOUS | Status: DC | PRN
  Administered 2020-08-06: 3 g via INTRAVENOUS

## 2020-08-06 MED ORDER — ACETAMINOPHEN 10 MG/ML IV SOLN
INTRAVENOUS | Status: DC | PRN
Start: 2020-08-06 — End: 2020-08-06
  Administered 2020-08-06: 1000 mg via INTRAVENOUS

## 2020-08-06 MED ORDER — ONDANSETRON HCL 4 MG/2ML IJ SOLN
INTRAMUSCULAR | Status: DC | PRN
  Administered 2020-08-06: 4 mg via INTRAVENOUS

## 2020-08-06 MED ORDER — ACETAMINOPHEN 500 MG PO TABS: 1000.0000 mg | ORAL_TABLET | Freq: Once | ORAL | Status: DC

## 2020-08-06 MED ORDER — PHENYLEPHRINE 40 MCG/ML (10ML) SYRINGE FOR IV PUSH (FOR BLOOD PRESSURE SUPPORT)
PREFILLED_SYRINGE | INTRAVENOUS | Status: AC
  Filled 2020-08-06: qty 10

## 2020-08-06 MED ORDER — LIDOCAINE-EPINEPHRINE 2 %-1:100000 IJ SOLN
INTRAMUSCULAR | Status: DC | PRN
  Administered 2020-08-06: 5.1 mL

## 2020-08-06 MED ORDER — LACTATED RINGERS IV SOLN: INTRAVENOUS | Status: DC

## 2020-08-06 MED ORDER — PROPOFOL 10 MG/ML IV BOLUS
INTRAVENOUS | Status: DC | PRN
Start: 2020-08-06 — End: 2020-08-06
  Administered 2020-08-06: 180 mg via INTRAVENOUS

## 2020-08-06 MED ORDER — ORAL CARE MOUTH RINSE: 15.0000 mL | Freq: Once | OROMUCOSAL | Status: AC

## 2020-08-06 MED ORDER — OXYMETAZOLINE HCL 0.05 % NA SOLN
NASAL | Status: DC | PRN
  Administered 2020-08-06: 1 via TOPICAL

## 2020-08-06 MED ORDER — FENTANYL CITRATE (PF) 100 MCG/2ML IJ SOLN
INTRAMUSCULAR | Status: DC | PRN
  Administered 2020-08-06: 100 ug via INTRAVENOUS

## 2020-08-06 MED ORDER — BUPIVACAINE-EPINEPHRINE 0.5% -1:200000 IJ SOLN
INTRAMUSCULAR | Status: DC | PRN
  Administered 2020-08-06: 1.8 mL

## 2020-08-06 MED ORDER — FENTANYL CITRATE (PF) 100 MCG/2ML IJ SOLN
25.0000 ug | INTRAMUSCULAR | Status: DC | PRN
Start: 2020-08-06 — End: 2020-08-06

## 2020-08-06 MED ORDER — ALBUMIN HUMAN 5 % IV SOLN: INTRAVENOUS | Status: DC | PRN

## 2020-08-06 MED ORDER — ROCURONIUM BROMIDE 10 MG/ML (PF) SYRINGE
PREFILLED_SYRINGE | INTRAVENOUS | Status: DC | PRN
  Administered 2020-08-06: 80 mg via INTRAVENOUS

## 2020-08-06 MED ORDER — SUGAMMADEX SODIUM 200 MG/2ML IV SOLN
INTRAVENOUS | Status: DC | PRN
  Administered 2020-08-06: 220 mg via INTRAVENOUS

## 2020-08-06 MED ORDER — HYDROCODONE-ACETAMINOPHEN 5-325 MG PO TABS: 1.0000 | ORAL_TABLET | Freq: Four times a day (QID) | ORAL | 0 refills | Status: AC | PRN

## 2020-08-06 MED ORDER — HEMOSTATIC AGENTS (NO CHARGE) OPTIME
TOPICAL | Status: DC | PRN
  Administered 2020-08-06: 1 via TOPICAL

## 2020-08-06 MED ORDER — 0.9 % SODIUM CHLORIDE (POUR BTL) OPTIME
TOPICAL | Status: DC | PRN
  Administered 2020-08-06: 1000 mL

## 2020-08-06 MED ORDER — AMOXICILLIN 500 MG PO CAPS: 500.0000 mg | ORAL_CAPSULE | Freq: Three times a day (TID) | ORAL | 0 refills | Status: AC

## 2020-08-06 MED ORDER — BUPIVACAINE-EPINEPHRINE (PF) 0.5% -1:200000 IJ SOLN
INTRAMUSCULAR | Status: AC
  Filled 2020-08-06: qty 10.8

## 2020-08-06 MED ORDER — PHENYLEPHRINE HCL-NACL 10-0.9 MG/250ML-% IV SOLN
INTRAVENOUS | Status: DC | PRN
  Administered 2020-08-06: 50 ug/min via INTRAVENOUS

## 2020-08-06 MED ORDER — LIDOCAINE 2% (20 MG/ML) 5 ML SYRINGE
INTRAMUSCULAR | Status: AC
  Filled 2020-08-06: qty 5

## 2020-08-06 MED ORDER — ONDANSETRON HCL 4 MG/2ML IJ SOLN
INTRAMUSCULAR | Status: AC
  Filled 2020-08-06: qty 2

## 2020-08-06 MED ORDER — LIDOCAINE-EPINEPHRINE 2 %-1:100000 IJ SOLN
INTRAMUSCULAR | Status: AC
  Filled 2020-08-06: qty 10.2

## 2020-08-06 MED ORDER — ROCURONIUM BROMIDE 10 MG/ML (PF) SYRINGE
PREFILLED_SYRINGE | INTRAVENOUS | Status: AC
  Filled 2020-08-06: qty 10

## 2020-08-06 MED ORDER — PROPOFOL 10 MG/ML IV BOLUS
INTRAVENOUS | Status: AC
  Filled 2020-08-06: qty 20

## 2020-08-06 MED ORDER — OXYMETAZOLINE HCL 0.05 % NA SOLN
NASAL | Status: DC | PRN
  Administered 2020-08-06 (×2): 2 via NASAL

## 2020-08-06 MED ORDER — MIDAZOLAM HCL 5 MG/5ML IJ SOLN
INTRAMUSCULAR | Status: DC | PRN
  Administered 2020-08-06: 2 mg via INTRAVENOUS

## 2020-08-06 MED ORDER — FENTANYL CITRATE (PF) 250 MCG/5ML IJ SOLN
INTRAMUSCULAR | Status: AC
  Filled 2020-08-06: qty 5

## 2020-08-06 MED ORDER — SUGAMMADEX SODIUM 200 MG/2ML IV SOLN: INTRAVENOUS | Status: DC | PRN

## 2020-08-06 MED ORDER — CHLORHEXIDINE GLUCONATE 0.12 % MT SOLN
15.0000 mL | Freq: Once | OROMUCOSAL | Status: AC
  Administered 2020-08-06: 15 mL via OROMUCOSAL
  Filled 2020-08-06: qty 15

## 2020-08-06 MED ORDER — LIDOCAINE 2% (20 MG/ML) 5 ML SYRINGE
INTRAMUSCULAR | Status: DC | PRN
  Administered 2020-08-06: 80 mg via INTRAVENOUS

## 2020-08-06 MED ORDER — MIDAZOLAM HCL 2 MG/2ML IJ SOLN
INTRAMUSCULAR | Status: AC
  Filled 2020-08-06: qty 2

## 2020-08-06 SURGICAL SUPPLY — 37 items
ALCOHOL 70% 16 OZ (MISCELLANEOUS) ×2 IMPLANT
BANDAGE HEMOSTAT MRDH 4X4 STRL (MISCELLANEOUS) IMPLANT
BLADE SURG 15 STRL LF DISP TIS (BLADE) ×2 IMPLANT
BLADE SURG 15 STRL SS (BLADE) ×4
BNDG HEMOSTAT MRDH 4X4 STRL (MISCELLANEOUS)
COVER SURGICAL LIGHT HANDLE (MISCELLANEOUS) ×2 IMPLANT
COVER WAND RF STERILE (DRAPES) ×2 IMPLANT
GAUZE 4X4 16PLY RFD (DISPOSABLE) ×2 IMPLANT
GAUZE PACKING FOLDED 2  STR (GAUZE/BANDAGES/DRESSINGS) ×2
GAUZE PACKING FOLDED 2 STR (GAUZE/BANDAGES/DRESSINGS) ×1 IMPLANT
GLOVE BIO SURGEON STRL SZ 6.5 (GLOVE) ×2 IMPLANT
GLOVE SURG SS PI 6.0 STRL IVOR (GLOVE) ×2 IMPLANT
GOWN STRL REUS W/ TWL LRG LVL3 (GOWN DISPOSABLE) ×2 IMPLANT
GOWN STRL REUS W/TWL LRG LVL3 (GOWN DISPOSABLE) ×4
HEMOSTAT SURGICEL 2X14 (HEMOSTASIS) IMPLANT
KIT BASIN OR (CUSTOM PROCEDURE TRAY) ×2 IMPLANT
KIT TURNOVER KIT B (KITS) ×2 IMPLANT
MANIFOLD NEPTUNE II (INSTRUMENTS) ×2 IMPLANT
NEEDLE BLUNT 16X1.5 OR ONLY (NEEDLE) IMPLANT
NEEDLE DENTAL 27 LONG (NEEDLE) IMPLANT
NS IRRIG 1000ML POUR BTL (IV SOLUTION) ×2 IMPLANT
PACK EENT II TURBAN DRAPE (CUSTOM PROCEDURE TRAY) ×2 IMPLANT
PAD ARMBOARD 7.5X6 YLW CONV (MISCELLANEOUS) ×2 IMPLANT
SPONGE SURGIFOAM ABS GEL 100 (HEMOSTASIS) IMPLANT
SPONGE SURGIFOAM ABS GEL 12-7 (HEMOSTASIS) IMPLANT
SPONGE SURGIFOAM ABS GEL SZ50 (HEMOSTASIS) ×2 IMPLANT
SUCTION FRAZIER HANDLE 10FR (MISCELLANEOUS) ×2
SUCTION TUBE FRAZIER 10FR DISP (MISCELLANEOUS) ×1 IMPLANT
SUT CHROMIC 3 0 PS 2 (SUTURE) ×4 IMPLANT
SUT CHROMIC 4 0 P 3 18 (SUTURE) IMPLANT
SYR 50ML SLIP (SYRINGE) ×2 IMPLANT
SYR BULB IRRIG 60ML STRL (SYRINGE) ×2 IMPLANT
TOWEL GREEN STERILE FF (TOWEL DISPOSABLE) ×2 IMPLANT
TUBE CONNECTING 12X1/4 (SUCTIONS) ×2 IMPLANT
WATER STERILE IRR 1000ML POUR (IV SOLUTION) ×2 IMPLANT
WATER TABLETS ICX (MISCELLANEOUS) ×2 IMPLANT
YANKAUER SUCT BULB TIP NO VENT (SUCTIONS) ×2 IMPLANT

## 2020-08-06 NOTE — Anesthesia Procedure Notes (Signed)
Arterial Line Insertion Start/End1/12/2020 8:55 AM, 08/06/2020 9:10 AM Performed by: Gaylene Brooks, CRNA, CRNA  Preanesthetic checklist: patient identified, IV checked, site marked, risks and benefits discussed, surgical consent, monitors and equipment checked, pre-op evaluation, timeout performed and anesthesia consent Lidocaine 1% used for infiltration Left, radial was placed Catheter size: 20 G Hand hygiene performed  and maximum sterile barriers used   Attempts: 1 Procedure performed without using ultrasound guided technique. Following insertion, Biopatch and dressing applied. Post procedure assessment: normal  Patient tolerated the procedure well with no immediate complications.

## 2020-08-06 NOTE — Interval H&P Note (Signed)
History and Physical Interval Note:  08/06/2020 9:38 AM  Steven [name]  has presented today for surgery, with the diagnosis of DENTAL CARIES;PERIODONTAL DISEASE.  The various methods of treatment have been discussed with the patient and family. After consideration of risks, benefits and other options for treatment, the patient has consented to  Procedure(s): DENTAL RESTORATION/EXTRACTIONS (N/A) as a surgical intervention.  The patient's history has been reviewed, patient examined, no change in status, stable for surgery.  I have reviewed the patient's chart and labs.  Questions were answered to the patient's satisfaction.     [name]

## 2020-08-06 NOTE — Anesthesia Procedure Notes (Signed)
Procedure Name: Intubation Performed by: Cleda Daub, CRNA Pre-anesthesia Checklist: Patient identified, Emergency Drugs available, Suction available and Patient being monitored Patient Re-evaluated:Patient Re-evaluated prior to induction Oxygen Delivery Method: Circle system utilized Preoxygenation: Pre-oxygenation with 100% oxygen Induction Type: IV induction Ventilation: Mask ventilation without difficulty Laryngoscope Size: Glidescope and 4 Grade View: Grade I Tube type: Oral Nasal Tubes: Left, Nasal prep performed and Nasal Rae Tube size: 7.5 mm Number of attempts: 1 Airway Equipment and Method: Stylet and Oral airway Placement Confirmation: ETT inserted through vocal cords under direct vision,  positive ETCO2 and breath sounds checked- equal and bilateral Tube secured with: Tape Dental Injury: Teeth and Oropharynx as per pre-operative assessment

## 2020-08-06 NOTE — Discharge Instructions (Signed)
[facility] Dr. Ginamarie Banfield B. Jaunita Mikels, DMD Phone: [phone] Fax: [phone]    MOUTH CARE AFTER SURGERY   FACTS:  Ice used in ice bag helps keep the swelling down, and can help lessen the pain.  It is easier to treat pain BEFORE it happens.  Spitting disturbs the clot and may cause bleeding to start again, or to get worse.  Smoking delays healing and can cause complications.  Sharing prescriptions can be dangerous.  Do not take medications not recently prescribed for you.  Antibiotics may stop birth control pills from working.  Use other means of birth control while on antibiotics.  Warm salt water rinses after the first 24 hours will help lessen the swelling:  Use 1/2 teaspoonful of table salt per oz.of water.  DO NOT:  Do not spit.    Do not drink through a straw.  Strongly advised not to smoke, dip snuff or chew tobacco at least for 3 days.  Do not eat sharp or crunchy foods.  Avoid the area of surgery when chewing.  Do not stop your antibiotics before your instructions say to do so.  Do not eat hot foods until bleeding has stopped.  If you need to, let your food cool down to room temperature.  EXPECT:  Some swelling, especially first 2-3 days.  Soreness or discomfort in varying degrees.  Follow your dentist's instructions about how to handle pain before it starts.  Pinkish saliva or light blood in saliva, or on your pillow in the morning.  This can last around 24 hours.  Bruising inside or outside the mouth.  This may not show up until 2-3 days after surgery.  Don't worry, it will go away in time.  Pieces of "bone" may work themselves loose.  It's OK.  If they bother you, let us know.  WHAT TO DO IMMEDIATELY AFTER SURGERY:  Bite on gauze with steady pressure for 30-45 minutes at a time.  Switch out the gauze after 30-45 minutes for clean gauze, and continue this for 1-2 hours or until bleeding subsides.  Do not chew on the  gauze.  Do not lie down flat.  Raise your head support especially for the first 24 hours.  Apply ice to your face on the side of the surgery.  You may apply it 20 minutes on and a few minutes off.  Ice for 8-12 hours.  You may use ice up to 24 hours.  Before the numbness wears off, take a pain pill as instructed.  Prescription pain medication is not always required.  SWELLING:  Expect swelling for the first couple of days.  It should get better after that.  If swelling increases 3 days or so after surgery, let us know as soon as possible.  FEVER:  Take Tylenol every 4 hours if needed to lower your temperature, especially if it is at 100F or higher.  Drink lots of fluids.  If the fever does not go away, let us know.  BREATHING TROUBLE:  Any unusual difficulty breathing means you have to have someone bring you to the emergency room ASAP.  BLEEDING:  Light oozing is expected for 24 hours or so.  Prop head up with pillows.  Do not spit.  Do not confuse bright red fresh flowing blood with lots of saliva colored with a little bit of blood.  If you notice some bleeding, place gauze or a tea bag where it is bleeding and apply CONSTANT pressure by biting   down for 1 hour.  Avoid talking during this time.  Do not remove the gauze or tea bag during this hour to "check" the bleeding.  If you notice bright RED bleeding FLOWING out of particular area, and filling the floor of your mouth, put a wad of gauze on that area, bite down firmly and constantly.  Call us immediately.  If we're closed, have someone bring you to the emergency room.  ORAL HYGIENE:  Brush your teeth as usual after meals and before bedtime.  Use a soft toothbrush around the area of surgery.  DO NOT AVOID BRUSHING.  Otherwise bacteria(germs) will grow and may delay healing or encourage infection.  Since you cannot spit, just gently rinse and let the water flow out of your mouth.  DO NOT SWISH  HARD.  EATING:  Cool liquids are a good point to start.  Increase to soft foods as tolerated.  PRESCRIPTIONS:  Follow the directions for your prescriptions exactly as written.  If your doctor gave you a narcotic pain medication, do not drive, operate machinery or drink alcohol when on that medication.  QUESTIONS:  Call our office during office hours [phone] or call the Emergency Room at [phone]. 

## 2020-08-06 NOTE — Addendum Note (Signed)
Addendum  created 08/06/20 1631 by Freddrick March, MD   Clinical Note Signed

## 2020-08-06 NOTE — Transfer of Care (Signed)
Immediate Anesthesia Transfer of Care Note  Patient: [name]  Procedure(s) Performed: DENTAL EXTRACTIONS (N/A Mouth)  Patient Location: PACU  Anesthesia Type:General  Level of Consciousness: awake, alert , oriented and patient cooperative  Airway & Oxygen Therapy: Patient Spontanous Breathing and Patient connected to face mask oxygen  Post-op Assessment: Report given to RN and Post -op Vital signs reviewed and stable  Post vital signs: Reviewed and stable  Last Vitals:  Vitals Value Taken Time  BP 129/64 08/06/20 1135  Temp    Pulse 64 08/06/20 1137  Resp 14 08/06/20 1137  SpO2 100 % 08/06/20 1137  Vitals shown include unvalidated device data.  Last Pain:  Vitals:   08/06/20 0805  TempSrc: Oral         Complications: No complications documented.

## 2020-08-06 NOTE — Op Note (Signed)
OPERATIVE NOTE  Patient Name:  Steven [name] Date of Birth:   March 13, 1956 Medical Record Number: [id number]  DATE OF SURGERY:  08/06/2020  SURGEON: Dex Blakely B. Benson Norway, DMD  ASSISTANT: Molli Posey, DAII  PREOPERATIVE DIAGNOSES: Patient Active Problem List   Diagnosis Date Noted  . Acute blood loss anemia   . Acquired absence of left leg below knee (HCC)   . Aortic valve stenosis   . Open wound of left foot 06/01/2020  . Osteomyelitis of foot ([place]) 06/01/2020  . CKD (chronic kidney disease), stage III ([place]) 06/01/2020  . Diabetic foot infection ([place])   . Chronic pain 02/28/2020  . Vitamin D deficiency 02/25/2020  . Thrombocytopenia ([place]) 02/25/2020  . Normocytic anemia 02/25/2020  . Chronic venous hypertension (idiopathic) with ulcer and inflammation of right lower extremity ([place])   . Non-pressure chronic ulcer of right heel and midfoot limited to breakdown of skin ([place])   . Diabetic polyneuropathy associated with type 2 diabetes mellitus ([place])   . Venous insufficiency   . Cellulitis of right foot 12/21/2019  . Obesity, Class III, BMI 40-49.9 (morbid obesity) ([place])   . Osteomyelitis ([place]) 07/17/2017  . Kidney transplant recipient 05/24/2015  . Diabetes mellitus ([place]) 05/10/2012  . Hyperlipidemia 05/10/2012  . Essential hypertension 05/10/2012   Dental caries, periodontal disease  POSTOPERATIVE DIAGNOSES: Same  PROCEDURES PERFORMED: 1. Extractions of teeth numbers 2, 3, 4, 5, 6, 7, 8, 9, 10, 11, 12, 13, 14, 21 and 28 2. 2 Quadrants of alveoloplasty (ULQ and URQ)  ANESTHESIA: General anesthesia via nasal endotracheal tube.  MEDICATIONS: 1. Ancef 2 g IV prior to invasive dental procedures. 2. Local anesthesia with a total utilization of 3 cartridges of 34 mg of lidocaine with 0.018 mg of epinephrine/ea as well as 1 cartridge with 9 mg of bupivacaine with 0.009 mg of epinephrine/ea.  SPECIMENS: 15 teeth that were extracted and discarded.  DRAINS/CULTURES:  None  COMPLICATIONS: None  ESTIMATED BLOOD LOSS: 5 mL  INTRAVENOUS FLUIDS: 10 mL of Lactated ringers solution.  INDICATIONS: The patient was recently diagnosed with aortic valve stenosis, dental caries and periodontal disease.  A medically necessary dental consult was then requested to evaluate the patient prior to heart valve surgery.  The patient was examined and treatment planned for complete extractions of Maxillary teeth and teeth #21 and #28.  This treatment plan was formulated to decrease the risks and complications associated with dental infection from affecting the patient's systemic health and perioperative/postoperative pain and infection.  OPERATIVE FINDINGS: The patient was examined in operating room number 9.  The indicated teeth were identified for extraction. The patient was noted be affected by chronic periodontitis and severe dental caries.  DESCRIPTION OF PROCEDURE: Patient was identified in the holding area and brought to the main operating room number 9 by the anesthesia team. Patient was then placed in the supine position on the operating table. General anesthesia was then induced per the anesthesia team. The patient was then prepped and draped in the usual sterile fashion for dental medicine procedures. A timeout was performed. The patient was identified and procedures were verified. A throat pack was placed at this time. The oral cavity was then thoroughly examined with the findings noted above. The patient was then ready for dental medicine procedure as follows:  ROUTINE EXTRACTIONS: Local anesthesia was administered sequentially with a total utilization of 3 cartridges each containing 34 mg of lidocaine with 0.018 mg of epinephrine as well as 1 cartridge each containing 9 mg bupivacaine  with 0.009 mg of epinephrine. Location of anesthesia included Maxillary infiltration, palatal and nasopalatine, infiltration of teeth #21 and #28 and lingual, mental blocks.  The  Maxillary left and right quadrants first approached. The teeth were then subluxated with a series of straight elevators. Tooth numbers 2, 3, 4, 5, 6, 7, 8, 9, 10, 11, 12, 13 and 14 were then removed with Ronguers and a 150 forceps without complications. Alveoloplasty was then performed utilizing a ronguers and bone file. The surgical sites were then irrigated with copious amounts of sterile saline. The tissues were approximated and trimmed appropriately. Surgi Foam was placed in each extraction site. The surgical sites were all closed using 3-0 chromic gut sutures as follows: 1 continuous interlocking suture placed from the distal of #2 extraction site across the midline to the distal of tooth #14 extraction site.  The Mandibular left and right quadrants were then approached. The teeth were subluxated with a series of straight elevators. Tooth numbers 21 and 28 were then removed utilizing a 151 forceps. The tissues were approximated and trimmed appropriately. The surgical sites were then irrigated with copious amounts of sterile saline. Surgi Foam was placed in each extraction site. The surgical sites were all closed using 3-0 chromic gut sutures as follows: 1 simple interrupted suture placed across #28 extraction site.   END OF PROCEDURE: Thorough oral irrigation with sterile saline was performed. The patient was examined for complications, seeing none, the dental medicine procedure was deemed to be complete. The throat pack was removed at this time. An oral airway was then placed at the request of the anesthesia team. A series of 4 x 4 gauze were placed in the mouth to aid hemostasis as needed. The patient was then handed over to the anesthesia team for final disposition. After an appropriate amount of time, the patient was extubated and taken to the postanesthsia care unit in stable condition. All counts were correct for the dental medicine procedure.   [place] Benson Norway, DMD

## 2020-08-06 NOTE — Anesthesia Postprocedure Evaluation (Signed)
Anesthesia Post Note  Patient: Steven [name]  Procedure(s) Performed: DENTAL EXTRACTIONS (N/A Mouth)     Patient location during evaluation: PACU Anesthesia Type: General Level of consciousness: awake and alert Pain management: pain level controlled Vital Signs Assessment: post-procedure vital signs reviewed and stable Respiratory status: spontaneous breathing, nonlabored ventilation, respiratory function stable and patient connected to nasal cannula oxygen Cardiovascular status: blood pressure returned to baseline and stable Postop Assessment: no apparent nausea or vomiting Anesthetic complications: no   No complications documented.  Last Vitals:  Vitals:   08/06/20 1220 08/06/20 1235  BP: (!) 113/54 (!) 118/48  Pulse: 68 67  Resp: 12 14  Temp: 37 C   SpO2: 97% 97%    Last Pain:  Vitals:   08/06/20 1205  TempSrc:   PainSc: 0-No pain                 Chancelor Hardrick L Anayah Arvanitis

## 2020-08-07 ENCOUNTER — Telehealth (HOSPITAL_COMMUNITY): Payer: Self-pay | Admitting: Dentistry

## 2020-08-07 ENCOUNTER — Encounter (HOSPITAL_COMMUNITY): Payer: Self-pay | Admitting: Dentistry

## 2020-08-07 NOTE — Telephone Encounter (Signed)
Called patient to see how he was doing after his dental surgery yesterday (08/06/20).  He stated that he is doing well considering having multiple extractions, he has little to no bleeding or swelling, and his pain is well-controlled and managed.  He requested that we call him back next week to schedule his Post-op appointment.  I instructed him to call us on Monday should he have any new questions or concerns.  He verbalized understanding and was appreciative of the phone call.  -MO

## 2020-08-08 ENCOUNTER — Other Ambulatory Visit: Payer: Self-pay | Admitting: Internal Medicine

## 2020-08-08 DIAGNOSIS — E559 Vitamin D deficiency, unspecified: Secondary | ICD-10-CM

## 2020-08-10 ENCOUNTER — Telehealth: Payer: Self-pay

## 2020-08-10 NOTE — Telephone Encounter (Signed)
Message to [facility] with [employer] to see if he has any information on this pt. I do not see that our office ordered. Could have been ordered through hospital after BKA. Will hold this message and await return call.

## 2020-08-10 NOTE — Telephone Encounter (Signed)
Wyatt Portela called from [facility] called on behalf of patient she stated the patient still hasn't received the correct wheelchair, PER patient one was delivered and he tried to contact whoever delivered it and couldn't get in contact with anyone. [place]

## 2020-08-11 NOTE — Telephone Encounter (Signed)
I called pt and he states that he was given a bariatric chair that is way to big for the pt and he is unable to get the chair in his home. He states tht since surgery 06/2020 he has not been able to be mobile in his home. Message to zach with [employer] to see what they can work out for the pt. Will continue to hold pending advisement

## 2020-08-11 NOTE — Telephone Encounter (Signed)
[employer] advised they will pick up the improper wheelchair and switch it for the correct chair. I called pt to advise to expect the call today.

## 2020-08-12 ENCOUNTER — Ambulatory Visit (INDEPENDENT_AMBULATORY_CARE_PROVIDER_SITE_OTHER): Payer: BC Managed Care – PPO | Admitting: Physician Assistant

## 2020-08-12 ENCOUNTER — Encounter: Payer: Self-pay | Admitting: Physician Assistant

## 2020-08-12 DIAGNOSIS — Z89512 Acquired absence of left leg below knee: Secondary | ICD-10-CM

## 2020-08-12 NOTE — Progress Notes (Signed)
Office Visit Note   Patient: Steven [name]           Date of Birth: 11-Sep-1955           MRN: [id number] Visit Date: 08/12/2020              Requested by: Isaac Bliss, Rayford Halsted, MD [place],  [place] [id number] PCP: Isaac Bliss, Rayford Halsted, MD  Chief Complaint  Patient presents with  . 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  He has been wearing his shrinker and wears Ace wrap over the top if he has any concerns for drainage.  He is scheduled to meet with [facility] next week  Assessment & Plan: Visit Diagnoses: No diagnosis found.  Plan: Continue wearing shrinker he will need a smaller shrinker when he visits with [facility].  Follow-up in 1 month  Follow-Up Instructions: No follow-ups on file.   Ortho Exam  Patient is alert, oriented, no adenopathy, well-dressed, normal affect, normal respiratory effort. Left below-knee amputation stump swelling is decreasing.  He does have an eschar that was removed on the medial side it did have a touch of bleeding which was coagulated with a silver nitrate stick.  No cellulitis no foul odor there is a few places of eschar no wound dehiscence  Imaging: No results found.   Labs: Lab Results  Component Value Date   HGBA1C 9.3 (H) 06/01/2020   HGBA1C 9.4 (H) 12/21/2019   HGBA1C 9.3 (H) 07/17/2017   ESRSEDRATE 40 (H) 07/17/2017   CRP 11.1 (H) 07/17/2017   REPTSTATUS 06/06/2020 FINAL 06/01/2020   REPTSTATUS 06/06/2020 FINAL 06/01/2020   CULT  06/01/2020    NO GROWTH 5 DAYS Performed at [facility], [place] [address] [place], [place] [address]    CULT  06/01/2020    NO GROWTH 5 DAYS Performed at [facility] [address] [place], [place] [address]      Lab Results  Component Value Date   ALBUMIN 2.5 (L) 06/11/2020   ALBUMIN 3.0 (L) 06/03/2020   ALBUMIN 3.3 (L)  06/01/2020   PREALBUMIN 16.1 (L) 07/17/2017    Lab Results  Component Value Date   MG 1.7 06/03/2020   MG 1.5 (L) 07/18/2017   MG 1.5 07/12/2007   Lab Results  Component Value Date   VD25OH 18 (L) 02/21/2020    Lab Results  Component Value Date   PREALBUMIN 16.1 (L) 07/17/2017   CBC EXTENDED Latest Ref Rng & Units 08/06/2020 07/09/2020 06/22/2020  WBC 4.0 - 10.5 K/uL 4.0 3.3(L) 2.7(L)  RBC 4.22 - 5.81 MIL/uL 4.25 3.99(L) 3.29(L)  HGB 13.0 - 17.0 g/dL 10.6(L) 10.3(L) 8.4(L)  HCT 39.0 - 52.0 % 35.6(L) 32.1(L) 27.4(L)  PLT 150 - 400 K/uL 121(L) 123(L) 138(L)  NEUTROABS 1,500 - 7,800 cells/uL - 2,485 -  LYMPHSABS 850 - 3,900 cells/uL - 528(L) -     There is no height or weight on file to calculate BMI.  Orders:  No orders of the defined types were placed in this encounter.  No orders of the defined types were placed in this encounter.    Procedures: No procedures performed  Clinical Data: No additional findings.  ROS:  All other systems negative, except as noted in the HPI. Review of Systems  Objective: Vital Signs: There were  no vitals taken for this visit.  Specialty Comments:  No specialty comments available.  PMFS History: Patient Active Problem List   Diagnosis Date Noted  . Acute blood loss anemia   . Acquired absence of left leg below knee (HCC)   . Aortic valve stenosis   . Open wound of left foot 06/01/2020  . Osteomyelitis of foot ([place]) 06/01/2020  . CKD (chronic kidney disease), stage III ([place]) 06/01/2020  . Diabetic foot infection ([place])   . Chronic pain 02/28/2020  . Vitamin D deficiency 02/25/2020  . Thrombocytopenia ([place]) 02/25/2020  . Normocytic anemia 02/25/2020  . Chronic venous hypertension (idiopathic) with ulcer and inflammation of right lower extremity ([place])   . Non-pressure chronic ulcer of right heel and midfoot limited to breakdown of skin ([place])   . Diabetic polyneuropathy associated with type 2 diabetes mellitus ([place])   .  Venous insufficiency   . Cellulitis of right foot 12/21/2019  . Obesity, Class III, BMI 40-49.9 (morbid obesity) ([place])   . Osteomyelitis ([place]) 07/17/2017  . Kidney transplant recipient 05/24/2015  . Diabetes mellitus ([place]) 05/10/2012  . Hyperlipidemia 05/10/2012  . Essential hypertension 05/10/2012   Past Medical History:  Diagnosis Date  . Bursitis    left elbow  . Cataract    removed by surgery  . Constipation due to opioid therapy    and decreased ambulation  . Diabetes ([place])    type 2  . 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 Corliss Parish.  . GERD (gastroesophageal reflux disease)   . Heart murmur   . HLD (hyperlipidemia)   . Hyperparathyroidism ([place])   . Hypertension    Patient denies this DX, no meds  . Kidney transplant recipient   . Obesity   . Sleep apnea    uses CPAP nightly  . Venous insufficiency   . Vitamin D deficiency     Family History  Problem Relation Age of Onset  . Healthy Mother   . Healthy Father   . Colon cancer Neg Hx   . Rectal cancer Neg Hx   . Stomach cancer Neg Hx     Past Surgical History:  Procedure Laterality Date  . AMPUTATION Right 07/19/2017   Procedure: AMPUTATION RIGHT 2ND TOE;  Surgeon: Leandrew Koyanagi, MD;  Location: [facility];  Service: Orthopedics;  Laterality: Right;  . AMPUTATION Left 06/03/2020   Procedure: LEFT BELOW KNEE AMPUTATION;  Surgeon: Newt Minion, MD;  Location: [facility];  Service: Orthopedics;  Laterality: Left;  . COLONOSCOPY     greater than 10 yrs ago  . EYE SURGERY Bilateral    cataracts removed  . KIDNEY TRANSPLANT  2006  . NEPHRECTOMY TRANSPLANTED ORGAN  2006  . RIGHT HEART CATH AND CORONARY ANGIOGRAPHY N/A 06/08/2020   Procedure: RIGHT HEART CATH AND CORONARY ANGIOGRAPHY;  Surgeon: Nelva Bush, MD;  Location: [facility];  Service: Cardiovascular;  Laterality: N/A;  . TOOTH EXTRACTION N/A 08/06/2020   Procedure: DENTAL EXTRACTIONS;  Surgeon: [name], DMD;  Location: [facility];  Service: Dentistry;  Laterality: N/A;  . UPPER GASTROINTESTINAL ENDOSCOPY    . WISDOM TOOTH EXTRACTION     Social History   Occupational History  . Occupation: trooper  Tobacco Use  . Smoking status: Never Smoker  . Smokeless tobacco: Former Systems developer    Types: Secondary school teacher  . Vaping Use: Never used  Substance and Sexual Activity  . Alcohol use: No  .  Drug use: No  . Sexual activity: Yes

## 2020-08-13 ENCOUNTER — Encounter: Payer: Self-pay | Admitting: Surgery

## 2020-08-13 ENCOUNTER — Institutional Professional Consult (permissible substitution): Payer: BC Managed Care – PPO | Admitting: Surgery

## 2020-08-13 ENCOUNTER — Encounter: Payer: BC Managed Care – PPO | Admitting: Surgery

## 2020-08-13 ENCOUNTER — Other Ambulatory Visit: Payer: Self-pay

## 2020-08-13 ENCOUNTER — Other Ambulatory Visit: Payer: Self-pay | Admitting: Internal Medicine

## 2020-08-13 VITALS — BP 124/77 | HR 73 | Resp 20 | Ht 71.0 in

## 2020-08-13 DIAGNOSIS — I350 Nonrheumatic aortic (valve) stenosis: Secondary | ICD-10-CM | POA: Diagnosis not present

## 2020-08-13 DIAGNOSIS — Z794 Long term (current) use of insulin: Secondary | ICD-10-CM

## 2020-08-13 NOTE — Progress Notes (Signed)
Patient ID: Steven [name], male   DOB: Nov 23, 1955, 65 y.o.   MRN: [id number]  [place] SURGERY CONSULTATION REPORT  Referring Provider is Sherren Mocha, MD Primary Cardiologist is Sherren Mocha, MD PCP is Isaac Bliss, Rayford Halsted, MD  Chief Complaint  Patient presents with  . Consult    Initial surgical consult, SAVR vs TAVR .     HPI:  The patient is a 65 year old gentleman with a history of hypertension, hyperlipidemia, poorly controlled diabetes and history of heavy NSAID use leading to end-stage renal disease.  He underwent renal transplantation in 2006 and has been followed by Dr. [name].  His creatinine has been around 2 for several years.  He was hospitalized in November 2021 with a nonhealing left diabetic foot ulcer that failed outpatient antibiotics.  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 Sharol Given.  He developed postoperative volume overload with acute diastolic heart failure requiring intravenous diuresis.  His BNP was elevated and chest x-ray showed interstitial pulmonary edema consistent with congestive heart failure.  He had a longstanding heart murmur but does not remember having been told that he had aortic stenosis.  A 2D echocardiogram showed a heavily calcified aortic valve with a mean gradient of 67 mmHg.  There is moderate concentric LVH with grade 2 diastolic dysfunction.  Ejection fraction was 60 to 65%.  There was thickening and calcification of the mitral valve leaflets with moderate mitral annular calcification.  There is mild mitral valve stenosis with a mean gradient of 4 mmHg and a peak gradient of 13.7 mmHg.  There is mild MR.  Cardiac catheterization showed mild nonobstructive coronary disease with moderate to severely elevated left and right heart pressures.  Since discharge home he has been feeling fairly well and his left BKA stump has  been healing without signs of infection.  He recently saw Dr. Sharol Given and has been scheduled to see a prosthetic specialist.  The patient is here today with his mother.  He reports having a several year history of exertional fatigue and occasional shortness of breath.  He denies any orthopnea.  He said no PND.  He denies peripheral edema.  He denies dizziness and syncope.  Past Medical History:  Diagnosis Date  . Bursitis    left elbow  . Cataract    removed by surgery  . Constipation due to opioid therapy    and decreased ambulation  . Diabetes ([place])    type 2  . Diabetic glomerulosclerosis [facility])    [facility] 03/21/12 by Dr. Corliss Parish.  . GERD (gastroesophageal reflux disease)   . Heart murmur   . HLD (hyperlipidemia)   . Hyperparathyroidism ([place])   . Hypertension    Patient denies this DX, no meds  . Kidney transplant recipient   . Obesity   . Sleep apnea    uses CPAP nightly  . Venous insufficiency   . Vitamin D deficiency     Past Surgical History:  Procedure Laterality Date  . AMPUTATION Right 07/19/2017   Procedure: AMPUTATION RIGHT 2ND TOE;  Surgeon: Leandrew Koyanagi, MD;  Location: [facility];  Service: Orthopedics;  Laterality: Right;  . AMPUTATION Left 06/03/2020   Procedure: LEFT BELOW KNEE AMPUTATION;  Surgeon: Newt Minion, MD;  Location: [place];  Service: Orthopedics;  Laterality: Left;  . COLONOSCOPY     greater than 10 yrs ago  . EYE SURGERY Bilateral  cataracts removed  . KIDNEY TRANSPLANT  2006  . NEPHRECTOMY TRANSPLANTED ORGAN  2006  . RIGHT HEART CATH AND CORONARY ANGIOGRAPHY N/A 06/08/2020   Procedure: RIGHT HEART CATH AND CORONARY ANGIOGRAPHY;  Surgeon: Nelva Bush, MD;  Location: [facility];  Service: Cardiovascular;  Laterality: N/A;  . TOOTH EXTRACTION N/A 08/06/2020   Procedure: DENTAL EXTRACTIONS;  Surgeon: [name], DMD;  Location: [facility];  Service: Dentistry;  Laterality: N/A;  . UPPER GASTROINTESTINAL  ENDOSCOPY    . WISDOM TOOTH EXTRACTION      Family History  Problem Relation Age of Onset  . Healthy Mother   . Healthy Father   . Colon cancer Neg Hx   . Rectal cancer Neg Hx   . Stomach cancer Neg Hx     Social History   Socioeconomic History  . Marital status: Married    Spouse name: Not on file  . Number of children: Not on file  . Years of education: Not on file  . Highest education level: Not on file  Occupational History  . Occupation: trooper  Tobacco Use  . Smoking status: Never Smoker  . Smokeless tobacco: Former Systems developer    Types: Secondary school teacher  . Vaping Use: Never used  Substance and Sexual Activity  . Alcohol use: No  . Drug use: No  . Sexual activity: Yes  Other Topics Concern  . Not on file  Social History Narrative  . Not on file   Social Determinants of Health   Financial Resource Strain: Not on file  Food Insecurity: Not on file  Transportation Needs: Not on file  Physical Activity: Not on file  Stress: Not on file  Social Connections: Not on file  Intimate Partner Violence: Not on file    Current Outpatient Medications  Medication Sig Dispense Refill  . acetaminophen-codeine (TYLENOL #3) 300-30 MG tablet Take 1 tablet by mouth every 8 (eight) hours as needed for moderate pain. 30 tablet 0  . amoxicillin (AMOXIL) 500 MG capsule Take 1 capsule (500 mg total) by mouth 3 (three) times daily for 7 days. 21 capsule 0  . aspirin EC 81 MG EC tablet Take 1 tablet (81 mg total) by mouth daily. Swallow whole. 30 tablet 11  . atorvastatin (LIPITOR) 10 MG tablet Take 1 tablet (10 mg total) by mouth daily.    . cyclobenzaprine (FLEXERIL) 5 MG tablet Take 1 tablet (5 mg total) by mouth 3 (three) times daily as needed for muscle spasms. 30 tablet 0  . docusate sodium (COLACE) 100 MG capsule Take 1 capsule (100 mg total) by mouth 2 (two) times daily. 60 capsule 0  . doxycycline (VIBRA-TABS) 100 MG tablet Take 1 tablet (100 mg total) by mouth 2 (two) times  daily. 60 tablet 0  . fenofibrate (TRICOR) 48 MG tablet Take 1 tablet (48 mg total) by mouth daily.    . furosemide (LASIX) 20 MG tablet Take 1 tablet (20 mg total) by mouth daily. 30 tablet `  . glyBURIDE (DIABETA) 5 MG tablet Take 1 tablet (5 mg total) by mouth 2 (two) times daily with a meal.    . insulin aspart (NOVOLOG) 100 UNIT/ML injection Inject 0-30 Units into the skin 3 (three) times daily as needed for high blood sugar. Sliding scale 10 mL 11  . insulin detemir (LEVEMIR) 100 UNIT/ML FlexPen Inject 10 Units into the skin daily. (Patient taking differently: Inject 10 Units into the skin in the morning.) 15 mL 1  .  Insulin Pen Needle (BD PEN NEEDLE NANO 2ND GEN) 32G X 4 MM MISC Use daily for glucose control 100 each 3  . Insulin Pen Needle 32G X 4 MM MISC Use as directed with insulin pen 100 each 1  . metoprolol tartrate (LOPRESSOR) 50 MG tablet Take as directed prior to 12/16 CT scan 1 tablet 0  . mycophenolate (MYFORTIC) 360 MG TBEC EC tablet Take 1 tablet (360 mg total) by mouth 2 (two) times daily. 120 tablet   . omeprazole (PRILOSEC) 20 MG capsule Take 1 capsule (20 mg total) by mouth daily. 90 capsule 3  . ONETOUCH VERIO test strip 1 each by Other route 3 (three) times daily. 100 each 12  . polyethylene glycol (MIRALAX / GLYCOLAX) 17 g packet Take 17 g by mouth daily as needed for mild constipation. 14 each 0  . tacrolimus (PROGRAF) 1 MG capsule Take 1 capsule (1 mg total) by mouth 2 (two) times daily.    . traMADol (ULTRAM) 50 MG tablet Take 1 tablet (50 mg total) by mouth every 12 (twelve) hours as needed for moderate pain. 30 tablet 0  . traZODone (DESYREL) 50 MG tablet Take 1 tablet (50 mg total) by mouth at bedtime. 30 tablet 0   No current facility-administered medications for this visit.    Allergies  Allergen Reactions  . Sulfa Antibiotics Rash      Review of Systems:   General:  normal appetite, + decreased energy, no weight gain, no weight loss, no  fever  Cardiac:  no chest pain with exertion, no chest pain at rest, noSOB with  exertion, no resting SOB, no PND, no orthopnea, no palpitations, no arrhythmia, no atrial fibrillation, no LE edema, no dizzy spells, no syncope  Respiratory:  no shortness of breath, no home oxygen, no productive cough, no dry cough, no bronchitis, no wheezing, no hemoptysis, no asthma, no pain with inspiration or cough, no sleep apnea, no CPAP at night  GI:   no difficulty swallowing, no reflux, no frequent heartburn, no hiatal hernia, no abdominal pain, no constipation, no diarrhea, no hematochezia, no hematemesis, no melena  GU:   no dysuria,  no frequency, no urinary tract infection, no hematuria, no enlarged prostate, no kidney stones, + kidney disease  Vascular:  no pain suggestive of claudication, no pain in feet, no leg cramps, no varicose veins, no DVT, + hx of non-healing foot ulcer prior to amputation  Neuro:   no stroke, no TIA's, no seizures, no headaches, no temporary blindness one eye,  no slurred speech, no peripheral neuropathy, no chronic pain, + instability of gait, no memory/cognitive dysfunction  Musculoskeletal: no arthritis, no joint swelling, no myalgias, + difficulty walking, + reduced mobility   Skin:   no rash, no itching, no skin infections, no pressure sores or ulcerations  Psych:   no anxiety, no depression, no nervousness, no unusual recent stress  Eyes:   no blurry vision, no floaters, no recent vision changes, does not wear glasses or contacts  ENT:   no hearing loss, no loose or painful teeth, no dentures, last saw dentist recently when he had all remaining teeth extracted by Dr. Jerilynn Mages. Owsley on 08/06/2020.  Hematologic:  no easy bruising, no abnormal bleeding, no clotting disorder, no frequent epistaxis  Endocrine:  + diabetes, does check CBG's at home       Physical Exam:   BP 124/77 (BP Location: Right Arm, Patient Position: Sitting)   Pulse 73   Resp 20  Ht 5\' 11"  (1.803 m)    SpO2 99% Comment: RA with mask on  BMI 32.08 kg/m   General:  well-appearing in wheel chair  HEENT:  Unremarkable  Neck:   no JVD, no bruits, no adenopathy   Chest:   clear to auscultation, symmetrical breath sounds, no wheezes, no rhonchi   CV:   RRR, grade lll/VI crescendo/decrescendo murmur heard best at RSB,  no diastolic murmur  Abdomen:  soft, non-tender, no masses   Extremities:  warm, well-perfused, pulses palpable right ankle, L BKA, no LE edema  Rectal/GU  Deferred  Neuro:   Grossly non-focal and symmetrical throughout  Skin:   Clean and dry, no rashes, no breakdown   Diagnostic Tests:  ECHOCARDIOGRAM REPORT       Patient Name:  Steven [name] Date of Exam: 06/04/2020  Medical Rec #: [id number]      Height:    71.0 in  Accession #:  [id number]     Weight:    245.8 lb  Date of Birth: May 10, 1956      BSA:     2.302 m  Patient Age:  37 years      BP:      115/67 mmHg  Patient Gender: M          HR:      67 bpm.  Exam Location: Inpatient   Procedure: 2D Echo, 3D Echo, Cardiac Doppler, Color Doppler and  Intracardiac       Opacification Agent   REPORT CONTAINS CRITICAL RESULT   Indications:  R01.1 Murmur    History:    Patient has no prior history of Echocardiogram  examinations.         Signs/Symptoms:Shortness of Breath and Dyspnea; Risk         Factors:Sleep Apnea, Hypertension and Diabetes. Leg  amputation         on previous day. Kidney transplant.    Sonographer:  Roseanna Rainbow RDCS  Referring Phys: [id number] DAWOOD S [facility]     Sonographer Comments: Technically difficult study due to poor echo windows  and patient is morbidly obese. Image acquisition challenging due to  patient body habitus.  IMPRESSIONS    1. Severely calcified aortic and mitral valves with severe aortic  stenosis and mild mitral stenosis.  2. Left ventricular ejection fraction,  by estimation, is 60 to 65%. The  left ventricle has normal function. The left ventricle has no regional  wall motion abnormalities. There is moderate concentric left ventricular  hypertrophy. Left ventricular  diastolic parameters are consistent with Grade II diastolic dysfunction  (pseudonormalization). Elevated left atrial pressure.  3. Right ventricular systolic function is normal. The right ventricular  size is normal.  4. The mitral valve is normal in structure. Mild mitral valve  regurgitation. Mild mitral stenosis. Moderate mitral annular  calcification.  5. The aortic valve is calcified. There is severe calcifcation of the  aortic valve. There is severe thickening of the aortic valve. Aortic valve  regurgitation is not visualized. Severe aortic valve stenosis. Aortic  valve mean gradient measures 67.0  mmHg.  6. The inferior vena cava is normal in size with greater than 50%  respiratory variability, suggesting right atrial pressure of 3 mmHg.   FINDINGS  Left Ventricle: Left ventricular ejection fraction, by estimation, is 60  to 65%. The left ventricle has normal function. The left ventricle has no  regional wall motion abnormalities. Definity contrast agent was given IV  to delineate the left ventricular  endocardial borders. The left ventricular internal cavity size was normal  in size. There is moderate concentric left ventricular hypertrophy. Left  ventricular diastolic parameters are consistent with Grade II diastolic  dysfunction (pseudonormalization).  Elevated left atrial pressure.   Right Ventricle: The right ventricular size is normal. No increase in  right ventricular wall thickness. Right ventricular systolic function is  normal.   Left Atrium: Left atrial size was normal in size.   Right Atrium: Right atrial size was normal in size.   Pericardium: There is no evidence of pericardial effusion.   Mitral Valve: The mitral valve is normal in  structure. There is severe  thickening of the mitral valve leaflet(s). There is severe calcification  of the mitral valve leaflet(s). Moderate mitral annular calcification.  Mild mitral valve regurgitation. Mild  mitral valve stenosis. MV peak gradient, 13.7 mmHg. The mean mitral valve  gradient is 4.0 mmHg.   Tricuspid Valve: The tricuspid valve is normal in structure. Tricuspid  valve regurgitation is not demonstrated. No evidence of tricuspid  stenosis.   Aortic Valve: The aortic valve is calcified. There is severe calcifcation  of the aortic valve. There is severe thickening of the aortic valve.  Aortic valve regurgitation is not visualized. Severe aortic stenosis is  present. Aortic valve mean gradient  measures 67.0 mmHg. Aortic valve peak gradient measures 108.4 mmHg. Aortic  valve area, by VTI measures 4.16 cm.   Pulmonic Valve: The pulmonic valve was normal in structure. Pulmonic valve  regurgitation is not visualized. No evidence of pulmonic stenosis.   Aorta: The aortic root is normal in size and structure.   Venous: The inferior vena cava is normal in size with greater than 50%  respiratory variability, suggesting right atrial pressure of 3 mmHg.   IAS/Shunts: No atrial level shunt detected by color flow Doppler.     LEFT VENTRICLE  PLAX 2D  LVIDd:     4.90 cm   Diastology  LVIDs:     3.70 cm   LV e' medial:  4.43 cm/s  LV PW:     1.60 cm   LV E/e' medial: 35.7  LV IVS:    1.20 cm   LV e' lateral:  5.37 cm/s  LVOT diam:   2.40 cm   LV E/e' lateral: 29.4  LV SV:     525  LV SV Index:  228  LVOT Area:   4.52 cm    LV Volumes (MOD)  LV vol d, MOD A2C: 79.6 ml  LV vol d, MOD A4C: 99.0 ml  LV vol s, MOD A2C: 18.7 ml  LV vol s, MOD A4C: 42.3 ml  LV SV MOD A2C:   60.9 ml  LV SV MOD A4C:   99.0 ml  LV SV MOD BP:   61.7 ml   RIGHT VENTRICLE      IVC  RV S prime:   9.65 cm/s IVC diam: 2.50 cm  TAPSE  (M-mode): 2.4 cm   LEFT ATRIUM       Index    RIGHT ATRIUM      Index  LA diam:    3.90 cm 1.69 cm/m RA Area:   15.90 cm  LA Vol (A2C):  57.4 ml 24.94 ml/m RA Volume:  39.40 ml 17.12 ml/m  LA Vol (A4C):  27.0 ml 11.73 ml/m  LA Biplane Vol: 40.0 ml 17.38 ml/m  AORTIC VALVE  AV Area (Vmax):  4.52 cm  AV Area (Vmean):  4.45 cm  AV Area (  VTI):   4.16 cm  AV Vmax:      520.60 cm/s  AV Vmean:     371.000 cm/s  AV VTI:      1.262 m  AV Peak Grad:   108.4 mmHg  AV Mean Grad:   67.0 mmHg  LVOT Vmax:     520.00 cm/s  LVOT Vmean:    365.000 cm/s  LVOT VTI:     1.160 m  LVOT/AV VTI ratio: 0.92    AORTA  Ao Root diam: 3.60 cm  Ao Asc diam: 3.30 cm   MITRAL VALVE  MV Area (PHT): 2.37 cm   SHUNTS  MV Peak grad: 13.7 mmHg  Systemic VTI: 1.16 m  MV Mean grad: 4.0 mmHg   Systemic Diam: 2.40 cm  MV Vmax:    1.85 m/s  MV Vmean:   85.8 cm/s  MV Decel Time: 320 msec  MV E velocity: 158.00 cm/s  MV A velocity: 94.90 cm/s  MV E/A ratio: 1.66   Ena Dawley MD  Electronically signed by Ena Dawley MD  Signature Date/Time: 06/04/2020/4:31:23 PM     Physicians  Panel Physicians Referring Physician Case Authorizing Physician  End, Harrell Gave, MD (Primary)      Procedures  RIGHT HEART CATH AND CORONARY ANGIOGRAPHY   Conclusion  Conclusions: 1. Mild to moderate, non-obstructive coronary artery disease with 30-40% mid LAD stenosis.  No significant disease identified in the LCx or RCA. 2. Moderately to severely elevated left heart, right heart, and pulmonary artery pressures. 3. Normal cardiac output/index.  Recommendations: 1. Ongoing workup of severe aortic stenosis per valve team. 2. Maintain net even to slightly negative fluid balance today.  If renal function remains stable, careful escalation of diuresis is recommended. 3. Medical therapy and risk factor modification to prevent  progression of coronary artery disease.  Nelva Bush, MD [facility]    Recommendations  Antiplatelet/Anticoag Recommend Aspirin 81mg  daily for moderate CAD.  Discharge Date Anticipated discharge date to be determined.   Surgeon Notes    06/03/2020 5:20 PM Op Note signed by Newt Minion, MD    Indications  Nonrheumatic aortic valve stenosis [I35.0 (ICD-10-CM)]   Procedural Details  Technical Details Indication: 65 y.o. year-old man with history of HTN, HLD, IDDM, morbid obesity, OSA, anemia of chronic disease, ESRD s/p renal transplantation (2006) with CKD stage IV, PAD, and severe AS, presenting for evaluation of severe aortic stenosis.  GFR: 48 ml/min  Procedure: The risks, benefits, complications, treatment options, and expected outcomes were discussed with the patient. The patient and/or family concurred with the proposed plan, giving informed consent. The patient was sedated with IV midazolam and fentanyl. The right wrist wrist and elbow were prepped and draped in a sterile fashion. 1% lidocaine was used for local anesthesia. Ultrasound was used to evaluate the right brachial vein. It was patent.  A micropuncture needle was used to access the right brachial vein under ultrasound guidance. A 22F slender Glidesheath was inserted using modified Seldinger technique. Right heart catheterization was performed by advancing a 22F balloon-tipped catheter through the right heart chambers into the pulmonary capillary wedge position. Pressure measurements and oxygen saturations were obtained.  Thermodilution measurements were also performed.  Ultrasound was used to evaluate the right radial artery. It was small and calcified but patent.  A micropuncture needle was used to access the right radial artery under ultrasound guidance.  Using the modified Seldinger access technique, a 27F slender Glidesheath was placed in the right radial artery. 3  mg Verapamil was given through the sheath.  Heparin 5,000 units were administered.Selective coronary angiography was performed using a 26F JL3.5 catheter to engage the left coronary artery and a 26F JR4 catheter to engage the right coronary artery. Left heart catheterization was not performed, given known severe aortic stenosis.  At the end of the procedure, and additional 2 mg verapamil was given via the radial artery sheath.  The radial artery sheath was removed and a TR band applied to achieve patent hemostasis. The brachial vein sheath was removed and hemostasis achieved with manual compression.  There were no immediate complications. The patient was taken to the recovery area in stable condition.  Estimated blood loss <50 mL.   During this procedure medications were administered to achieve and maintain moderate conscious sedation while the patient's heart rate, blood pressure, and oxygen saturation were continuously monitored and I was present face-to-face 100% of this time.   Medications (Filter: Administrations occurring from 1004 to 1116 on 06/08/20) (important) Continuous medications are totaled by the amount administered until 06/08/20 1116.    midazolam (VERSED) injection (mg) Total dose:  1 mg  Date/Time Rate/Dose/Volume Action   06/08/20 1035 1 mg Given    fentaNYL (SUBLIMAZE) injection (mcg) Total dose:  25 mcg  Date/Time Rate/Dose/Volume Action   06/08/20 1035 25 mcg Given    lidocaine (PF) (XYLOCAINE) 1 % injection (mL) Total volume:  5 mL  Date/Time Rate/Dose/Volume Action   06/08/20 1035 5 mL Given    Radial Cocktail/Verapamil only (mL) Total volume:  20 mL  Date/Time Rate/Dose/Volume Action   06/08/20 1050 10 mL Given   1108 10 mL Given    Heparin (Porcine) in NaCl 1000-0.9 UT/500ML-% SOLN (mL) Total volume:  1,000 mL  Date/Time Rate/Dose/Volume Action   06/08/20 1051 500 mL Given   1051 500 mL Given    heparin sodium (porcine) injection (Units) Total dose:  5,000 Units  Date/Time  Rate/Dose/Volume Action   06/08/20 1055 5,000 Units Given    iohexol (OMNIPAQUE) 350 MG/ML injection (mL) Total volume:  45 mL  Date/Time Rate/Dose/Volume Action   06/08/20 1114 45 mL Given    (feeding supplement) PROSource Plus liquid 30 mL (mL) Total dose:  Cannot be calculated* Dosing weight:  111.5  *Administration dose not documented Date/Time Rate/Dose/Volume Action   06/08/20 1004 *Not included in total MAR Hold    0.9 % sodium chloride infusion (mL/hr) Total dose:  Cannot be calculated* Dosing weight:  111.5  *Administration dose not documented Date/Time Rate/Dose/Volume Action   06/08/20 1004 *Not included in total MAR Hold    atorvastatin (LIPITOR) tablet 10 mg (mg) Total dose:  Cannot be calculated*  *Administration dose not documented Date/Time Rate/Dose/Volume Action   06/08/20 1004 *Not included in total MAR Hold    docusate sodium (COLACE) capsule 100 mg (mg) Total dose:  Cannot be calculated* Dosing weight:  111.5  *Administration dose not documented Date/Time Rate/Dose/Volume Action   06/08/20 1004 *Not included in total MAR Hold    fenofibrate tablet 54 mg (mg) Total dose:  Cannot be calculated*  *Administration dose not documented Date/Time Rate/Dose/Volume Action   06/08/20 1004 *Not included in total MAR Hold    furosemide (LASIX) tablet 40 mg (mg) Total dose:  Cannot be calculated*  *Administration dose not documented Date/Time Rate/Dose/Volume Action   06/08/20 1004 *Not included in total MAR Hold    glyBURIDE (DIABETA) tablet 5 mg (mg) Total dose:  Cannot be calculated* Dosing weight:  111.5  *Administration dose not  documented Date/Time Rate/Dose/Volume Action   06/08/20 1004 *Not included in total MAR Hold    heparin injection 5,000 Units (Units) Total dose:  Cannot be calculated*  *Administration dose not documented Date/Time Rate/Dose/Volume Action   06/08/20 1004 *Not included in total MAR Hold    HYDROmorphone (DILAUDID)  injection 0.5 mg (mg) Total dose:  Cannot be calculated* Dosing weight:  111.5  *Administration dose not documented Date/Time Rate/Dose/Volume Action   06/08/20 1004 *Not included in total MAR Hold    insulin aspart (novoLOG) injection 0-15 Units (Units) Total dose:  Cannot be calculated* Dosing weight:  111.5  *Administration dose not documented Date/Time Rate/Dose/Volume Action   06/08/20 1004 *Not included in total MAR Hold    insulin aspart (novoLOG) injection 6 Units (Units) Total dose:  Cannot be calculated* Dosing weight:  111.5  *Administration dose not documented Date/Time Rate/Dose/Volume Action   06/08/20 1004 *Not included in total MAR Hold    insulin detemir (LEVEMIR) injection 10 Units (Units) Total dose:  Cannot be calculated* Dosing weight:  111.5  *Administration dose not documented Date/Time Rate/Dose/Volume Action   06/08/20 1004 *Not included in total MAR Hold    insulin detemir (LEVEMIR) injection 15 Units (Units) Total dose:  Cannot be calculated* Dosing weight:  111.5  *Administration dose not documented Date/Time Rate/Dose/Volume Action   06/08/20 1004 *Not included in total MAR Hold    mycophenolate (MYFORTIC) EC tablet 360 mg (mg) Total dose:  Cannot be calculated* Dosing weight:  111.5  *Administration dose not documented Date/Time Rate/Dose/Volume Action   06/08/20 1004 *Not included in total MAR Hold    oxyCODONE (Oxy IR/ROXICODONE) immediate release tablet 5 mg (mg) Total dose:  Cannot be calculated*  *Administration dose not documented Date/Time Rate/Dose/Volume Action   06/08/20 1004 *Not included in total MAR Hold    pantoprazole (PROTONIX) EC tablet 40 mg (mg) Total dose:  Cannot be calculated*  *Administration dose not documented Date/Time Rate/Dose/Volume Action   06/08/20 1004 *Not included in total MAR Hold    polyethylene glycol (MIRALAX / GLYCOLAX) packet 17 g (g) Total dose:  Cannot be calculated*  *Administration  dose not documented Date/Time Rate/Dose/Volume Action   06/08/20 1004 *Not included in total MAR Hold    sodium chloride flush (NS) 0.9 % injection 3 mL (mL) Total dose:  Cannot be calculated* Dosing weight:  111.5  *Administration dose not documented Date/Time Rate/Dose/Volume Action   06/08/20 1004 *Not included in total MAR Hold    tacrolimus (PROGRAF) capsule 1 mg (mg) Total dose:  Cannot be calculated* Dosing weight:  111.5  *Administration dose not documented Date/Time Rate/Dose/Volume Action   06/08/20 1004 *Not included in total MAR Hold    Sedation Time  Sedation Time Physician-1: 33 minutes 50 seconds   Contrast  Medication Name Total Dose  iohexol (OMNIPAQUE) 350 MG/ML injection 45 mL    Radiation/Fluoro  Fluoro time: 5.8 (min) DAP: 26.9 (Gycm2) Cumulative Air Kerma: 315.4 (mGy)   Complications   Complications documented before study signed (06/08/2020 00:86 AM)    No complications were associated with this study.  Documented by Nelva Bush, MD - 06/08/2020 11:53 AM     Coronary Findings   Diagnostic Dominance: Right  Left Main  Vessel is large. Vessel is angiographically normal. Short LMCA.  Left Anterior Descending  Mid LAD lesion is 35% stenosed.  First Diagonal Branch  Vessel is moderate in size.  Second Diagonal Branch  Vessel is moderate in size.  Left Circumflex  Vessel is large.  Vessel is angiographically normal.  First Obtuse Marginal Branch  Vessel is moderate in size.  Second Obtuse Marginal Branch  Vessel is small in size.  Third Obtuse Marginal Branch  Vessel is moderate in size.  Right Coronary Artery  Vessel is large. The vessel exhibits minimal luminal irregularities.  Right Posterior Descending Artery  Vessel is moderate in size.  Right Posterior Atrioventricular Artery  Vessel is moderate in size.   Intervention   No interventions have been documented.  Right Heart  Right Heart Pressures RA (mean): 14  mmHg RV (S/EDP): 60/14 mmHg PA (S/D, mean): 60/33 (42) mmHg PCWP (mean): 30 mmHg  Ao sat: 99% PA sat: 60%  Fick CO: 6.7 L/min Fick CI: 2.9 L/min/m^2  Thermodilution CO: 7.3 L/min Thermodilution CI: 3.2 L/min/m^2   Coronary Diagrams   Diagnostic Dominance: Right    Intervention    Implants    No implant documentation for this case.   Syngo Images  Show images for CARDIAC CATHETERIZATION  Images on Long Term Storage  Show images for Regie, Bunner "Alvester Chou"  Link to Procedure Log  Procedure Log     Hemo Data  Flowsheet Row Most Recent Value  Fick Cardiac Output 6.7 L/min  Fick Cardiac Output Index 2.92 (L/min)/BSA  Thermal Cardiac Output 7.31 L/min  Thermal Cardiac Output Index 3.18 (L/min)/BSA  RA A Wave 14 mmHg  RA V Wave 12 mmHg  RA Mean 9 mmHg  RV Systolic Pressure 50 mmHg  RV Diastolic Pressure 5 mmHg  RV EDP 12 mmHg  PA Systolic Pressure 54 mmHg  PA Diastolic Pressure 24 mmHg  PA Mean 38 mmHg  PW A Wave 23 mmHg  PW V Wave 28 mmHg  PW Mean 24 mmHg  TPVR Index 11.95 HRUI    ADDENDUM REPORT: 07/16/2020 23:28  CLINICAL DATA:  Pre-op transcatheter aortic valve replacement (TAVR)  EXAM: Cardiac TAVR CT  TECHNIQUE: The patient was scanned on a Siemens Force 756 slice scanner. A 120 kV retrospective scan was triggered in the descending thoracic aorta at 111 HU's. Gantry rotation speed was 270 msecs and collimation was .9 mm. The 3D data set was reconstructed in 5% intervals of the R-R cycle. Systolic and diastolic phases were analyzed on a dedicated work station using MPR, MIP and VRT modes. The patient received 187mL OMNIPAQUE IOHEXOL 350 MG/ML SOLN of contrast.  FINDINGS: Aortic Valve: Tricuspid aortic valve. Severely reduced cusp separation. Severely thickened, severely calcified aortic valve cusps.  AV calcium score: 3748  Virtual Basal Annulus Measurements:  Maximum/Minimum Diameter: 29.3 x 21 mm  Perimeter: 78.3  mm  Area: 460 cm2  LVOT calcification extending inferiorly from left coronary cusp and protruding into annulus.  Based on these measurements, the annulus would be suitable for a 26 mm Sapien 3 valve.  Sinus of Valsalva Measurements:  Non-coronary:  31 mm  Right - coronary:  32 mm  Left - coronary:  33 mm  Sinus of Valsalva Height:  Left: 19.6 mm  Right: 22.4 mm  Aorta: Conventional 3 vessel branch pattern of aortic arch. Mild aortic atherosclerosis.  Sinotubular Junction:  27 mm  Ascending Thoracic Aorta:  33 mm  Aortic Arch:  27 mm  Descending Thoracic Aorta:  21 mm  Coronary Artery Height above Annulus:  Left Main: 12 mm  Right Coronary: 17.1 mm  Coronary Arteries: Normal coronary origins with right dominance. Coronary calcium score of 443. This was 86th percentile for age and sex matched control. Study not performed to assess  coronary artery lumen.  Optimum Fluoroscopic Angle for Delivery: LAO 1, CAU 2  IMPRESSION: 1. Aortic Valve: Tricuspid aortic valve. Severely reduced cusp separation. Severely thickened, severely calcified aortic valve cusps.  2.  AV calcium score: 3748  3. LVOT calcification extending inferiorly from left coronary cusp and protruding into annulus.  4.  Aortic annulus is 460 mm2, suitable for a 26 mm Sapien 3 valve.  5.  Adequate coronary artery height from annulus.  6. Optimum Fluoroscopic Angle for Delivery: LAO 1, CAU 2   Electronically Signed   By: Cherlynn Kaiser   On: 07/16/2020 23:28   Addended by Elouise Munroe, MD on 07/16/2020 11:31 PM    Study Result  Narrative & Impression  EXAM: OVER-READ INTERPRETATION  CT CHEST  The following report is an over-read performed by radiologist Dr. Vinnie Langton of [facility], [place] on 07/16/2020. This over-read does not include interpretation of cardiac or coronary anatomy or pathology. The coronary calcium score/coronary  CTA interpretation by the cardiologist is attached.  COMPARISON:  Chest CT 07/13/2007.  FINDINGS: Extracardiac findings will be described separately under dictation for contemporaneously obtained CTA chest, abdomen and pelvis.  IMPRESSION: Please see separate dictation for contemporaneously obtained CTA chest, abdomen and pelvis dated 07/16/2020 for full description of relevant extracardiac findings.  Electronically Signed: By: Vinnie Langton M.D. On: 07/16/2020 12:20     Narrative & Impression  CLINICAL DATA:  65 year old male with history of severe aortic stenosis. Preprocedural study prior to potential transcatheter aortic valve replacement (TAVR) procedure.  EXAM: CT ANGIOGRAPHY CHEST, ABDOMEN AND PELVIS  TECHNIQUE: Multidetector CT imaging through the chest, abdomen and pelvis was performed using the standard protocol during bolus administration of intravenous contrast. Multiplanar reconstructed images and MIPs were obtained and reviewed to evaluate the vascular anatomy.  CONTRAST:  131mL OMNIPAQUE IOHEXOL 350 MG/ML SOLN  COMPARISON:  None.  FINDINGS: CTA CHEST FINDINGS  Cardiovascular: Heart size is mildly enlarged. There is no significant pericardial fluid, thickening or pericardial calcification. There is aortic atherosclerosis, as well as atherosclerosis of the great vessels of the mediastinum and the coronary arteries, including calcified atherosclerotic plaque in the left main, left anterior descending, left circumflex and right coronary arteries. Severe thickening calcification of the aortic valve. Moderate to severe calcifications of the mitral annulus. Dilatation of the pulmonic trunk (3.5 cm in diameter).  Mediastinum/Lymph Nodes: No pathologically enlarged mediastinal or hilar lymph nodes. Multiple prominent borderline enlarged mediastinal and right hilar lymph nodes are incidentally noted (nonspecific). Esophagus is unremarkable in  appearance. No axillary lymphadenopathy.  Lungs/Pleura: Mosaic attenuation in the lungs bilaterally, suggesting areas of air trapping from small airways disease. No acute consolidative airspace disease. No pleural effusions. No definite suspicious appearing pulmonary nodules or masses are noted.  Musculoskeletal/Soft Tissues: There are no aggressive appearing lytic or blastic lesions noted in the visualized portions of the skeleton.  CTA ABDOMEN AND PELVIS FINDINGS  Hepatobiliary: No suspicious cystic or solid hepatic lesions. No intra or extrahepatic biliary ductal dilatation. Gallbladder is normal in appearance.  Pancreas: No pancreatic mass. No pancreatic ductal dilatation. No pancreatic or peripancreatic fluid collections or inflammatory changes. Coarse calcifications in the proximal body of the pancreas likely sequela of chronic pancreatitis.  Spleen: Unremarkable.  Adrenals/Urinary Tract: Severe atrophy of the native kidneys bilaterally. Bilateral adrenal glands are normal in appearance. No hydroureteronephrosis. Urinary bladder is normal in appearance. Transplant kidney in the right renal fossa is normal in appearance, without evidence of perinephric fluid collections or other acute  features.  Stomach/Bowel: The appearance of the stomach is normal. There is no pathologic dilatation of small bowel or colon. Normal appendix.  Vascular/Lymphatic: Aortic atherosclerosis, with vascular findings and measurements pertinent to potential TAVR procedure, as detailed below. No aneurysm or dissection noted in the abdominal or pelvic vasculature. No lymphadenopathy noted in the abdomen or pelvis.  Reproductive: Prostate gland and seminal vesicles are unremarkable in appearance.  Other: No significant volume of ascites.  No pneumoperitoneum.  Musculoskeletal: There are no aggressive appearing lytic or blastic lesions noted in the visualized portions of the  skeleton.  VASCULAR MEASUREMENTS PERTINENT TO TAVR:  AORTA:  Minimal Aortic Diameter-14 x 13 mm  Severity of Aortic Calcification-mild-to-moderate  RIGHT PELVIS:  Right Common Iliac Artery -  Minimal Diameter-8.5 x 9.7 mm  Tortuosity - mild  Calcification-moderate  Right External Iliac Artery -  Minimal Diameter-7.7 x 7.7 mm  Tortuosity - mild  Calcification-mild  Right Common Femoral Artery -  Minimal Diameter-7.6 x 6.6 mm  Tortuosity - mild  Calcification-mild  LEFT PELVIS:  Left Common Iliac Artery -  Minimal Diameter-8.5 x 8.7 mm  Tortuosity - mild  Calcification-mild  Left External Iliac Artery -  Minimal Diameter-7.5 x 7.3 mm  Tortuosity - mild  Calcification-mild  Left Common Femoral Artery -  Minimal Diameter-8.1 x 6.3 mm  Tortuosity-mild  Calcification-mild  Review of the MIP images confirms the above findings.  IMPRESSION: 1. Vascular findings and measurements pertinent to potential TAVR procedure, as detailed above. 2. Severe thickening calcification of the aortic valve, compatible with reported clinical history of severe aortic stenosis. 3. There is also moderate to severe calcifications of the mitral annulus. 4. Aortic atherosclerosis, in addition to left main and 3 vessel coronary artery disease. Please note that although the presence of coronary artery calcium documents the presence of coronary artery disease, the severity of this disease and any potential stenosis cannot be assessed on this non-gated CT examination. Assessment for potential risk factor modification, dietary therapy or pharmacologic therapy may be warranted, if clinically indicated. 5. Dilatation of the pulmonic trunk (3.5 cm in diameter), concerning for potential pulmonary arterial hypertension. 6. Mosaic attenuation in the lung parenchyma suggesting of areas of air trapping from small airways disease. Further evaluation  with noncontrast high-resolution chest CT is recommended in 3 months to better evaluate these findings. 7. Transplant kidney in the right iliac fossa with anastomosis to the right external iliac artery. 8. Additional incidental findings, as above.   Electronically Signed   By: Vinnie Langton M.D.   On: 07/17/2020 11:09    STS Risk Calculator: Procedure: Isolated AVR Risk of Mortality: 2.319% Renal Failure: 13.059% Permanent Stroke: 1.244% Prolonged Ventilation: 8.441% DSW Infection: 0.532% Reoperation: 4.473% Morbidity or Mortality: 23.012% Short Length of Stay: 34.907% Long Length of Stay: 9.339%  _____________________  Impression:  This 65 year old gentleman has stage D, severe, symptomatic aortic stenosis with New York Heart Association class II symptoms of exertional fatigue and shortness of breath consistent with chronic diastolic congestive heart failure.  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  He had an elevated BNP and interstitial edema on chest x-ray at that time.  I have personally reviewed his 2D echocardiogram, cardiac catheterization, and CTA studies.  His echocardiogram shows a severely calcified aortic valve with markedly restricted mobility.  The mean gradient across the valve is 67 mmHg consistent with critical aortic stenosis.  Left ventricular ejection fraction  was normal with moderate LVH and grade 2 diastolic dysfunction.  There is mild mitral stenosis and regurgitation cardiac catheterization showed mild nonobstructive coronary disease with markedly elevated left and right heart pressures.  I agree that aortic valve replacement is indicated in this patient for relief of his symptoms and to prevent progressive left ventricular deterioration.  He has done fairly well at home recovering from his amputation and recently underwent extraction of his  remaining teeth he is 65 years old but given his comorbid risk factors of obesity, poorly controlled diabetes, recent left BKA with immobility, renal transplant with stage III chronic kidney disease, nutritional deficit with an albumin of 2.5 and a prealbumin of 16.1, I think the best option for him would be transcatheter aortic valve replacement.  The potential benefit of open surgical AVR does not outweigh the risk in this particular patient.  I think he would be at significant risk for worsening renal failure, poor wound healing, and complications related to immobility with a recent left BKA including sternal wound problems.  I discussed all of this with him and he is in agreement with transcatheter aortic valve placement.  I discussed the increased risk of structural valve deterioration and lack of long-term durability studies with TAVR.  His gated cardiac CTA shows anatomy suitable for transcatheter aortic valve replacement using a SAPIEN 3 valve.  There is some calcification below the left coronary cusp extending into the annulus and LV outflow tract which could increase the risk of paravalvular leak.  His abdominal and pelvic CTA shows adequate pelvic vascular anatomy to allow transfemoral insertion.  It would probably be best to insert this through the left femoral artery since his renal transplant is anastomosed to the right external iliac artery.  The patient and his mother were counseled at length regarding treatment alternatives for management of severe symptomatic aortic stenosis. The risks and benefits of surgical intervention has been discussed in detail. Long-term prognosis with medical therapy was discussed. Alternative approaches such as conventional surgical aortic valve replacement, transcatheter aortic valve replacement, and palliative medical therapy were compared and contrasted at length. This discussion was placed in the context of the patient's own specific clinical presentation and past  medical history. All of their questions have been addressed.   Following the decision to proceed with transcatheter aortic valve replacement, a discussion was held regarding what types of management strategies would be attempted intraoperatively in the event of life-threatening complications, including whether or not the patient would be considered a candidate for the use of cardiopulmonary bypass and/or conversion to open sternotomy for attempted surgical intervention. The patient is aware of the fact that transient use of cardiopulmonary bypass may be necessary.  He would be a candidate for emergent sternotomy if needed to manage any intraoperative complications.  The patient has been advised of a variety of complications that might develop including but not limited to risks of death, stroke, paravalvular leak, aortic dissection or other major vascular complications, aortic annulus rupture, device embolization, cardiac rupture or perforation, mitral regurgitation, acute myocardial infarction, arrhythmia, heart block or bradycardia requiring permanent pacemaker placement, congestive heart failure, respiratory failure, renal failure, pneumonia, infection, other late complications related to structural valve deterioration or migration, or other complications that might ultimately cause a temporary or permanent loss of functional independence or other long term morbidity. The patient provides full informed consent for the procedure as described and all questions were answered.      Plan:  He will be scheduled for  transfemoral transcatheter aortic valve replacement using a SAPIEN 3 valve.  I spent 60 minutes performing this consultation and > 50% of this time was spent face to face counseling and coordinating the care of this patient's severe symptomatic aortic stenosis.    Gaye Pollack, MD 08/13/2020

## 2020-08-18 ENCOUNTER — Ambulatory Visit (HOSPITAL_COMMUNITY): Payer: BC Managed Care – PPO | Admitting: Dentistry

## 2020-08-19 ENCOUNTER — Telehealth (HOSPITAL_COMMUNITY): Payer: Self-pay

## 2020-08-21 ENCOUNTER — Telehealth: Payer: Self-pay

## 2020-08-21 NOTE — Telephone Encounter (Signed)
  [facility]   MULTIDISCIPLINARY HEART VALVE TEAM  Pt undergoing evaluation for tentative TAVR on 09/08/20. At this time pre TAVR PT Eval will be deferred. Pt underwent Left BKA 06/03/20 and just completed in home PT yesterday, the pt remains wheelchair bound.  The pt will begin evaluation for prosthesis next week.

## 2020-08-28 ENCOUNTER — Other Ambulatory Visit: Payer: Self-pay

## 2020-08-28 ENCOUNTER — Ambulatory Visit: Payer: BC Managed Care – PPO | Admitting: Internal Medicine

## 2020-08-28 DIAGNOSIS — I350 Nonrheumatic aortic (valve) stenosis: Secondary | ICD-10-CM

## 2020-09-03 NOTE — Progress Notes (Signed)
Spoke with Lauren RN TAVR coordinator about patients morning meds day of surgery.  It is ok for patient to take Anti-rejection meds the morning of procedure.  Continue to hold Levemir the morning of procedure.

## 2020-09-03 NOTE — Progress Notes (Signed)
Surgical Instructions    Your procedure is scheduled on February 8  Report to [facility] Main Entrance "A" at 0530 A.M., then check in with the Admitting office.  Call this number if you have problems the morning of surgery:  [phone]   If you have any questions prior to your surgery date call [phone]: Open Monday-Friday 8am-4pm    Remember:  Do not eat or drink after midnight the night before your surgery    Take these medications the morning of surgery with a sip of water  tacrolimus (PROGRAF)  mycophenolate (MYFORTIC)  As of today, STOP taking any Aspirin (unless otherwise instructed by your surgeon) Aleve, Naproxen, Ibuprofen, Motrin, Advil, Goody's, BC's, all herbal medications, fish oil, and all vitamins.  WHAT DO I DO ABOUT MY DIABETES MEDICATION?   Do not take oral diabetes medicines (pills) the morning of surgery. glyBURIDE (DIABETA)  Do not take  insulin detemir (LEVEMIR) the morning of surgery take it the day before like you normally do   [place]  Why is it important to control my blood sugar before and after surgery? . Improving blood sugar levels before and after surgery helps healing and can limit problems. . A way of improving blood sugar control is eating a healthy diet by: o  Eating less sugar and carbohydrates o  Increasing activity/exercise o  Talking with your doctor about reaching your blood sugar goals . High blood sugars (greater than 180 mg/dL) can raise your risk of infections and slow your recovery, so you will need to focus on controlling your diabetes during the weeks before surgery. . Make sure that the doctor who takes care of your diabetes knows about your planned surgery including the date and location.  How do I manage my blood sugar before surgery? . Check your blood sugar at least 4 times a day, starting 2 days before surgery, to make sure that the level is not too high or low. . Check  your blood sugar the morning of your surgery when you wake up and every 2 hours until you get to the Short Stay unit. o If your blood sugar is less than 70 mg/dL, you will need to treat for low blood sugar: - Do not take insulin. - Treat a low blood sugar (less than 70 mg/dL) with  cup of clear juice (cranberry or apple), 4 glucose tablets, OR glucose gel. - Recheck blood sugar in 15 minutes after treatment (to make sure it is greater than 70 mg/dL). If your blood sugar is not greater than 70 mg/dL on recheck, call [phone] for further instructions. . Report your blood sugar to the short stay nurse when you get to Short Stay.  . If you are admitted to the hospital after surgery: o Your blood sugar will be checked by the staff and you will probably be given insulin after surgery (instead of oral diabetes medicines) to make sure you have good blood sugar levels. o The goal for blood sugar control after surgery is 80-180 mg/dL.                     Do not wear jewelry            Do not wear lotions, powders, colognes, or deodorant.            Men may shave face and neck.            Do not bring  valuables to the hospital.            [facility] is not responsible for any belongings or valuables.  Do NOT Smoke (Tobacco/Vaping) or drink Alcohol 24 hours prior to your procedure If you use a CPAP at night, you may bring all equipment for your overnight stay.   Contacts, glasses, dentures or bridgework may not be worn into surgery, please bring cases for these belongings   For patients admitted to the hospital, discharge time will be determined by your treatment team.   Patients discharged the day of surgery will not be allowed to drive home, and someone needs to stay with them for 24 hours.    Special instructions:   [place]- Preparing For Surgery  Before surgery, you can play an important role. Because skin is not sterile, your skin needs to be as free of germs as possible. You can  reduce the number of germs on your skin by washing with CHG (chlorahexidine gluconate) Soap before surgery.  CHG is an antiseptic cleaner which kills germs and bonds with the skin to continue killing germs even after washing.    Oral Hygiene is also important to reduce your risk of infection.  Remember - BRUSH YOUR TEETH THE MORNING OF SURGERY WITH YOUR REGULAR TOOTHPASTE  Please do not use if you have an allergy to CHG or antibacterial soaps. If your skin becomes reddened/irritated stop using the CHG.  Do not shave (including legs and underarms) for at least 48 hours prior to first CHG shower. It is OK to shave your face.  Please follow these instructions carefully.   1. If you chose to wash your hair, wash your hair first as usual with your normal shampoo.  2. After you shampoo, rinse your hair and body thoroughly to remove the shampoo.  3. Wash Face and genitals (private parts) with your normal soap.   4. THEN Shower the NIGHT BEFORE SURGERY and the MORNING OF SURGERY with CHG Soap.   5. Use CHG as you would any other liquid soap. You can apply CHG directly to the skin and wash gently with a scrungie or a clean washcloth.   6. Apply the CHG Soap to your body ONLY FROM THE NECK DOWN.  Do not use on open wounds or open sores. Avoid contact with your eyes, ears, mouth and genitals (private parts). Wash Face and genitals (private parts)  with your normal soap.   7. Wash thoroughly, paying special attention to the area where your surgery will be performed.  8. Thoroughly rinse your body with warm water from the neck down.  9. DO NOT shower/wash with your normal soap after using and rinsing off the CHG Soap.  10. Pat yourself dry with a CLEAN TOWEL.  11. Wear CLEAN PAJAMAS to bed the night before surgery  12. Place CLEAN SHEETS on your bed the night before your surgery  13. DO NOT SLEEP WITH PETS.   Day of Surgery: Wear Clean/Comfortable clothing the morning of surgery Do not apply  any deodorants/lotions.   Remember to brush your teeth WITH YOUR REGULAR TOOTHPASTE.   Please read over the following fact sheets that you were given.

## 2020-09-04 ENCOUNTER — Other Ambulatory Visit: Payer: Self-pay

## 2020-09-04 ENCOUNTER — Encounter (HOSPITAL_COMMUNITY)
Admission: RE | Admit: 2020-09-04 | Discharge: 2020-09-04 | Disposition: A | Discharge status: Home / Self Care | Payer: BC Managed Care – PPO | Source: Ambulatory Visit | Attending: Cardiovascular Disease | Admitting: Cardiovascular Disease

## 2020-09-04 ENCOUNTER — Ambulatory Visit (HOSPITAL_COMMUNITY)
Admission: RE | Admit: 2020-09-04 | Discharge: 2020-09-04 | Disposition: A | Discharge status: Home / Self Care | Payer: BC Managed Care – PPO | Source: Ambulatory Visit | Attending: Cardiovascular Disease | Admitting: Cardiovascular Disease

## 2020-09-04 ENCOUNTER — Encounter (HOSPITAL_COMMUNITY): Payer: Self-pay

## 2020-09-04 DIAGNOSIS — Z01818 Encounter for other preprocedural examination: Secondary | ICD-10-CM | POA: Diagnosis present

## 2020-09-04 DIAGNOSIS — I350 Nonrheumatic aortic (valve) stenosis: Secondary | ICD-10-CM

## 2020-09-04 HISTORY — DX: Pneumonia, unspecified organism: J18.9

## 2020-09-04 HISTORY — DX: Dyspnea, unspecified: R06.00

## 2020-09-04 LAB — COMPREHENSIVE METABOLIC PANEL
ALT: 12 U/L (ref 0–44)
AST: 15 U/L (ref 15–41)
Albumin: 3.6 g/dL (ref 3.5–5.0)
Alkaline Phosphatase: 97 U/L (ref 38–126)
Anion gap: 13 (ref 5–15)
BUN: 34 mg/dL — ABNORMAL HIGH (ref 8–23)
CO2: 20 mmol/L — ABNORMAL LOW (ref 22–32)
Calcium: 9 mg/dL (ref 8.9–10.3)
Chloride: 107 mmol/L (ref 98–111)
Creatinine, Ser: 1.75 mg/dL — ABNORMAL HIGH (ref 0.61–1.24)
GFR, Estimated: 43 mL/min — ABNORMAL LOW (ref >60)
Glucose, Bld: 77 mg/dL (ref 70–99)
Potassium: 3.7 mmol/L (ref 3.5–5.1)
Sodium: 140 mmol/L (ref 135–145)
Total Bilirubin: 0.9 mg/dL (ref 0.3–1.2)
Total Protein: 6.6 g/dL (ref 6.5–8.1)

## 2020-09-04 LAB — CBC
HCT: 34.3 % — ABNORMAL LOW (ref 39.0–52.0)
Hemoglobin: 10.6 g/dL — ABNORMAL LOW (ref 13.0–17.0)
MCH: 25.1 pg — ABNORMAL LOW (ref 26.0–34.0)
MCHC: 30.9 g/dL (ref 30.0–36.0)
MCV: 81.3 fL (ref 80.0–100.0)
Platelets: 122 10*3/uL — ABNORMAL LOW (ref 150–400)
RBC: 4.22 MIL/uL (ref 4.22–5.81)
RDW: 16.8 % — ABNORMAL HIGH (ref 11.5–15.5)
WBC: 4.2 10*3/uL (ref 4.0–10.5)
nRBC: 0 % (ref 0.0–0.2)

## 2020-09-04 LAB — GLUCOSE, CAPILLARY: Glucose-Capillary: 85 mg/dL (ref 70–99)

## 2020-09-04 LAB — HEMOGLOBIN A1C
Hgb A1c MFr Bld: 6.8 % — ABNORMAL HIGH (ref 4.8–5.6)
Mean Plasma Glucose: 148.46 mg/dL

## 2020-09-04 LAB — PROTIME-INR
INR: 1.2 (ref 0.8–1.2)
Prothrombin Time: 14.6 seconds (ref 11.4–15.2)

## 2020-09-04 LAB — URINALYSIS, ROUTINE W REFLEX MICROSCOPIC
Bacteria, UA: NONE SEEN
Bilirubin Urine: NEGATIVE
Glucose, UA: NEGATIVE mg/dL
Ketones, ur: NEGATIVE mg/dL
Leukocytes,Ua: NEGATIVE
Nitrite: NEGATIVE
Protein, ur: 100 mg/dL — AB
Specific Gravity, Urine: 1.012 (ref 1.005–1.030)
pH: 5 (ref 5.0–8.0)

## 2020-09-04 LAB — SURGICAL PCR SCREEN
MRSA, PCR: NEGATIVE
Staphylococcus aureus: POSITIVE — AB

## 2020-09-04 LAB — BLOOD GAS, ARTERIAL
Acid-base deficit: 0.6 mmol/L (ref 0.0–2.0)
Bicarbonate: 22.8 mmol/L (ref 20.0–28.0)
Drawn by: 58793
FIO2: 21
O2 Saturation: 98.7 %
Patient temperature: 37
pCO2 arterial: 32.4 mmHg (ref 32.0–48.0)
pH, Arterial: 7.461 — ABNORMAL HIGH (ref 7.350–7.450)
pO2, Arterial: 111 mmHg — ABNORMAL HIGH (ref 83.0–108.0)

## 2020-09-04 LAB — TYPE AND SCREEN
ABO/RH(D): O POS
Antibody Screen: NEGATIVE

## 2020-09-04 NOTE — Progress Notes (Addendum)
PCP - Dr. Isaac Bliss  Cardiologist - Dr Burt Knack  Chest x-ray - 09/04/20  EKG - 09/04/20  Stress Test - none  ECHO - 06/04/20  Cardiac Cath - 06/04/20  Seep Study - yes CPAP - yes LABS-CBC, CMP, PT, PTT, T/S, UA, A1C, PCR  ASA- continue until the morning of procedure.  ERAS-no  HA1C-09/04/20 - 6.8 Fasting Blood Sugar - 120 Checks Blood Sugar __4___ times a day  Anesthesia-  Pt denies having chest pain, sob, or fever at this time. All instructions explained to the pt, with a verbal understanding of the material. Pt agrees to go over the instructions while at home for a better understanding. Pt also instructed to self quarantine after being tested for COVID-19. The opportunity to ask questions was provided.

## 2020-09-04 NOTE — Progress Notes (Signed)
Surgical Instructions    Your procedure is scheduled on February 8  Report to [facility] Main Entrance "A" at 0530 A.M., then check in with the Admitting office.              Your surgery or procedure is scheduled to begin at 7:30 AM  Call this number if you have problems the morning of surgery:  [phone]- this is the Pre- Surgery Desk   If you have any questions prior to your surgery date call [phone]: Open Monday-Friday 8am-4pm   Remember:  Do not eat or drink after midnight the night before your surgery  Do Not take evening dose of Glyburide (Diabeta) on Monday.   Take these medications the morning of surgery with a sip of water  tacrolimus (PROGRAF)  mycophenolate (MYFORTIC)  WHAT DO I DO ABOUT MY DIABETES MEDICATION?  Do not take oral diabetes medicines (pills) the morning of surgery. glyBURIDE (DIABETA)   Do not take  insulin detemir (LEVEMIR) the morning of surgery take it the day before like you normally do  As of today, STOP taking any Aspirin (unless otherwise instructed by your surgeon) Aleve, Naproxen, Ibuprofen, Motrin, Advil, Goody's, BC's, all herbal medications, fish oil, and all vitamins.  HOW TO MANAGE YOUR DIABETES BEFORE AND AFTER SURGERY  Why is it important to control my blood sugar before and after surgery? . Improving blood sugar levels before and after surgery helps healing and can limit problems. . A way of improving blood sugar control is eating a healthy diet by: o  Eating less sugar and carbohydrates o  Increasing activity/exercise o  Talking with your doctor about reaching your blood sugar goals . High blood sugars (greater than 180 mg/dL) can raise your risk of infections and slow your recovery, so you will need to focus on controlling your diabetes during the weeks before surgery. . Make sure that the doctor who takes care of your diabetes knows about your planned surgery including the date and location.  How do I manage my blood sugar  before surgery? . Check your blood sugar at least 4 times a day, starting 2 days before surgery, to make sure that the level is not too high or low. . Check your blood sugar the morning of your surgery when you wake up and every 2 hours until you get to the Short Stay unit. o If your blood sugar is less than 70 mg/dL, you will need to treat for low blood sugar: - Do not take insulin. - Treat a low blood sugar (less than 70 mg/dL) with  cup of clear juice (cranberry or apple), 4 glucose tablets, OR glucose gel. - Recheck blood sugar in 15 minutes after treatment (to make sure it is greater than 70 mg/dL). If your blood sugar is not greater than 70 mg/dL on recheck, call [phone] for further instructions. . Report your blood sugar to the short stay nurse when you get to Short Stay.  . If you are admitted to the hospital after surgery: o Your blood sugar will be checked by the staff and you will probably be given insulin after surgery (instead of oral diabetes medicines) to make sure you have good blood sugar levels. o The goal for blood sugar control after surgery is 80-180 mg/dL.          Special instructions:   - Preparing For Surgery  Before surgery, you can play an important role. Because skin is not sterile, your skin needs to  be as free of germs as possible. You can reduce the number of germs on your skin by washing with CHG (chlorahexidine gluconate) Soap before surgery.  CHG is an antiseptic cleaner which kills germs and bonds with the skin to continue killing germs even after washing.    Oral Hygiene is also important to reduce your risk of infection.  Remember - BRUSH YOUR TEETH THE MORNING OF SURGERY WITH YOUR REGULAR TOOTHPASTE  Please do not use if you have an allergy to CHG or antibacterial soaps. If your skin becomes reddened/irritated stop using the CHG.  Do not shave (including legs and underarms) for at least 48 hours prior to first CHG shower. It is OK to shave  your face.  Please follow these instructions carefully.   1. If you chose to wash your hair, wash your hair first as usual with your normal shampoo.  2. After you shampoo, rinse your hair and body thoroughly to remove the shampoo.  3. Wash Face and genitals (private parts) with your normal soap, then rinse. Only wash the three areas with your home soap, if you use home soap on your entire body and do not rinse thoroughly, it decreases the effectiveness of the CHG.   4. THEN Shower the NIGHT BEFORE SURGERY and the MORNING OF SURGERY with CHG Soap.   5. Use CHG as you would any other liquid soap. You can apply CHG directly to the skin and wash gently with a scrungie or a clean washcloth.   6. Apply the CHG Soap to your body ONLY FROM THE NECK DOWN.  Do not use on open wounds or open sores. Avoid contact with your eyes, ears, mouth and genitals (private parts).   7. Wash thoroughly, paying special attention to the area where your surgery will be performed.  8. Thoroughly rinse your body with warm water from the neck down.  9. DO NOT shower/wash with your normal soap after using and rinsing off the CHG Soap.  10. Pat yourself dry with a CLEAN TOWEL.  11. Wear CLEAN PAJAMAS to bed the night before surgery  12. Place CLEAN SHEETS on your bed the night before your surgery  13. DO NOT SLEEP WITH PETS.  Day of Surgery Shower as instructed above. Do not wear lotions, powders, colognes, or deodorant.hower as instructed above. Wear Clean/Comfortable clothing the morning of surgery Remember to brush your teeth WITH YOUR REGULAR TOOTHPASTE             Do not wear jewelry             Men may shave face and neck.             Do not bring valuables to the hospital.             [facility] is not responsible for any belongings or valuables.  If you use a CPAP at night, you bring your mask, please.   Contacts, glasses, dentures or bridgework may not be worn into surgery, please bring cases for  these belongings   For patients admitted to the hospital, discharge time will be determined by your treatment team.  For patients admitted to the hospital, discharge time will be determined by your treatment team.

## 2020-09-05 ENCOUNTER — Other Ambulatory Visit (HOSPITAL_COMMUNITY)
Admission: RE | Admit: 2020-09-05 | Discharge: 2020-09-05 | Disposition: A | Discharge status: Home / Self Care | Payer: BC Managed Care – PPO | Source: Ambulatory Visit | Attending: Cardiovascular Disease | Admitting: Cardiovascular Disease

## 2020-09-05 DIAGNOSIS — Z01812 Encounter for preprocedural laboratory examination: Secondary | ICD-10-CM | POA: Insufficient documentation

## 2020-09-05 DIAGNOSIS — Z20822 Contact with and (suspected) exposure to covid-19: Secondary | ICD-10-CM | POA: Insufficient documentation

## 2020-09-06 LAB — SARS CORONAVIRUS 2 (TAT 6-24 HRS): SARS Coronavirus 2: NEGATIVE

## 2020-09-07 MED ORDER — NOREPINEPHRINE 4 MG/250ML-% IV SOLN
0.0000 ug/min | INTRAVENOUS | Status: DC
  Filled 2020-09-07: qty 250

## 2020-09-07 MED ORDER — DEXMEDETOMIDINE HCL IN NACL 400 MCG/100ML IV SOLN
0.1000 ug/kg/h | INTRAVENOUS | Status: AC
  Administered 2020-09-08: 6 ug/kg/h via INTRAVENOUS
  Filled 2020-09-07: qty 100

## 2020-09-07 MED ORDER — VANCOMYCIN HCL 1500 MG/300ML IV SOLN
1500.0000 mg | INTRAVENOUS | Status: AC
  Administered 2020-09-08: 1500 mg via INTRAVENOUS
  Filled 2020-09-07: qty 300

## 2020-09-07 MED ORDER — SODIUM CHLORIDE 0.9 % IV SOLN
1.5000 g | INTRAVENOUS | Status: AC
  Administered 2020-09-08: 1.5 g via INTRAVENOUS
  Filled 2020-09-07: qty 1.5

## 2020-09-07 MED ORDER — POTASSIUM CHLORIDE 2 MEQ/ML IV SOLN
80.0000 meq | INTRAVENOUS | Status: DC
  Filled 2020-09-07: qty 40

## 2020-09-07 MED ORDER — SODIUM CHLORIDE 0.9 % IV SOLN
INTRAVENOUS | Status: DC
  Filled 2020-09-07: qty 30

## 2020-09-07 MED ORDER — MAGNESIUM SULFATE 50 % IJ SOLN
40.0000 meq | INTRAMUSCULAR | Status: DC
  Filled 2020-09-07: qty 9.85

## 2020-09-07 NOTE — Anesthesia Preprocedure Evaluation (Addendum)
Anesthesia Evaluation  Patient identified by MRN, date of birth, ID band Patient awake    Reviewed: Allergy & Precautions, NPO status , [name] Chart, lab work & pertinent test results  Airway Mallampati: II  TM Distance: >3 FB Neck ROM: Full    Dental  (+) Chipped, Missing, Poor Dentition, Edentulous Upper, Partial Lower   Pulmonary shortness of breath, sleep apnea and Continuous Positive Airway Pressure Ventilation ,    Pulmonary exam normal breath sounds clear to auscultation       Cardiovascular hypertension, Pt. on home beta blockers and Pt. on medications + Valvular Problems/Murmurs AS  Rhythm:Regular Rate:Normal + Systolic murmurs TTE 3149 1. Severely calcified aortic and mitral valves with severe aortic stenosis and mild mitral stenosis.  2. Left ventricular ejection fraction, by estimation, is 60 to 65%. The left ventricle has normal function. The left ventricle has no regional wall motion abnormalities. There is moderate concentric left ventricular hypertrophy. Left ventricular diastolic parameters are consistent with Grade II diastolic dysfunction (pseudonormalization). Elevated left atrial pressure.  3. Right ventricular systolic function is normal. The right ventricular size is normal.  4. The mitral valve is normal in structure. Mild mitral valve  regurgitation. Mild mitral stenosis. Moderate mitral annular  calcification.  5. The aortic valve is calcified. There is severe calcifcation of the aortic valve. There is severe thickening of the aortic valve. Aortic valve regurgitation is not visualized. Severe aortic valve stenosis. Aortic  valve mean gradient measures 67.0 mmHg.  6. The inferior vena cava is normal in size with greater than 50% respiratory variability, suggesting right atrial pressure of 3 mmHg.  LHC 2021 1.Mild to moderate, non-obstructive coronary artery disease with 30-40% mid LAD stenosis.  No  significant disease identified in the LCx or RCA. 2.Moderately to severely elevated left heart, right heart, and pulmonary artery pressures. 3.Normal cardiac output/index.     Neuro/Psych negative neurological ROS  negative psych ROS   GI/Hepatic Neg liver ROS, GERD  Controlled and Medicated,  Endo/Other  negative endocrine ROSdiabetes, Type 2, Oral Hypoglycemic Agents, Insulin Dependent  Renal/GU Renal InsufficiencyRenal disease (Cr 1.61, K 4.1, s/p transplant 2006)  negative genitourinary   Musculoskeletal negative musculoskeletal ROS (+)   Abdominal   Peds  Hematology  (+) Blood dyscrasia (Hgb 10.6), anemia ,   Anesthesia Other Findings  pre-TAVR dental extractions  Reproductive/Obstetrics                            Anesthesia Physical  Anesthesia Plan  ASA: IV  Anesthesia Plan: MAC   Post-op Pain Management:    Induction: Intravenous  PONV Risk Score and Plan: 2 and Midazolam, Dexamethasone, Ondansetron, Propofol infusion and Treatment may vary due to age or medical condition  Airway Management Planned: Natural Airway  Additional Equipment: Arterial line  Intra-op Plan:   Post-operative Plan:   Informed Consent: I have reviewed the patients History and Physical, chart, labs and discussed the procedure including the risks, benefits and alternatives for the proposed anesthesia with the patient or authorized representative who has indicated his/her understanding and acceptance.     Dental advisory given  Plan Discussed with: CRNA  Anesthesia Plan Comments: ( )       Anesthesia Quick Evaluation

## 2020-09-07 NOTE — H&P (Signed)
[address]       [place] [address]             [phone]      Cardiothoracic Surgery Admission History and Physical   Referring Provider is Steven Mocha, MD Primary Cardiologist is Steven Mocha, MD PCP is Steven [name], Steven Halsted, MD      Chief Complaint  Patient presents with  . Aortic stenosis        HPI:  The patient is a 65 year old gentleman with a history of hypertension, hyperlipidemia, poorly controlled diabetes and history of heavy NSAID use leading to end-stage renal disease.  He underwent renal transplantation in 2006 and has been followed by Steven [name].  His creatinine has been around 2 for several years.  He was hospitalized in November 2021 with a nonhealing left diabetic foot ulcer that failed outpatient antibiotics.  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  He developed postoperative volume overload with acute diastolic heart failure requiring intravenous diuresis.  His BNP was elevated and chest x-ray showed interstitial pulmonary edema consistent with congestive heart failure.  He had a longstanding heart murmur but does not remember having been told that he had aortic stenosis.  A 2D echocardiogram showed a heavily calcified aortic valve with a mean gradient of 67 mmHg.  There is moderate concentric LVH with grade 2 diastolic dysfunction.  Ejection fraction was 60 to 65%.  There was thickening and calcification of the mitral valve leaflets with moderate mitral annular calcification.  There is mild mitral valve stenosis with a mean gradient of 4 mmHg and a peak gradient of 13.7 mmHg.  There is mild MR.  Cardiac catheterization showed mild nonobstructive coronary disease with moderate to severely elevated left and right heart pressures.  Since discharge home he has been feeling fairly well and his left BKA stump has been healing without signs of infection.  He recently saw Dr.  Sharol [name] and has been scheduled to see a prosthetic specialist.  He reports having a several year history of exertional fatigue and occasional shortness of breath.  He denies any orthopnea.  He said no PND.  He denies peripheral edema.  He denies dizziness and syncope.      Past Medical History:  Diagnosis Date  . Bursitis    left elbow  . Cataract    removed by surgery  . Constipation due to opioid therapy    and decreased ambulation  . Diabetes ([place])    type 2  . 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  . GERD (gastroesophageal reflux disease)   . Heart murmur   . HLD (hyperlipidemia)   . Hyperparathyroidism ([place])   . Hypertension    Patient denies this DX, no meds  . Kidney transplant recipient   . Obesity   . Sleep apnea    uses CPAP nightly  . Venous insufficiency   . Vitamin D deficiency          Past Surgical History:  Procedure Laterality Date  . AMPUTATION Right 07/19/2017   Procedure: AMPUTATION RIGHT 2ND TOE;  Surgeon: Steven Koyanagi, MD;  Location: [facility];  Service: Orthopedics;  Laterality: Right;  . AMPUTATION Left 06/03/2020   Procedure: LEFT BELOW KNEE AMPUTATION;  Surgeon: Steven Minion, MD;  Location: [facility];  Service: Orthopedics;  Laterality: Left;  . COLONOSCOPY     greater  than 10 yrs ago  . EYE SURGERY Bilateral    cataracts removed  . KIDNEY TRANSPLANT  2006  . NEPHRECTOMY TRANSPLANTED ORGAN  2006  . RIGHT HEART CATH AND CORONARY ANGIOGRAPHY N/A 06/08/2020   Procedure: RIGHT HEART CATH AND CORONARY ANGIOGRAPHY;  Surgeon: Steven Bush, MD;  Location: [facility];  Service: Cardiovascular;  Laterality: N/A;  . TOOTH EXTRACTION N/A 08/06/2020   Procedure: DENTAL EXTRACTIONS;  Surgeon: Steven [name], DMD;  Location: [facility];  Service: Dentistry;  Laterality: N/A;  . UPPER GASTROINTESTINAL ENDOSCOPY    . WISDOM TOOTH EXTRACTION            Family History  Problem Relation Age of Onset  . Healthy Mother   . Healthy Father   . Colon cancer Neg Hx   . Rectal cancer Neg Hx   . Stomach cancer Neg Hx     Social History        Socioeconomic History  . Marital status: Married    Spouse name: Not on file  . Number of children: Not on file  . Years of education: Not on file  . Highest education level: Not on file  Occupational History  . Occupation: trooper  Tobacco Use  . Smoking status: Never Smoker  . Smokeless tobacco: Former Systems developer    Types: Secondary school teacher  . Vaping Use: Never used  Substance and Sexual Activity  . Alcohol use: No  . Drug use: No  . Sexual activity: Yes  Other Topics Concern  . Not on file  Social History Narrative  . Not on file   Social Determinants of Health   Financial Resource Strain: Not on file  Food Insecurity: Not on file  Transportation Needs: Not on file  Physical Activity: Not on file  Stress: Not on file  Social Connections: Not on file  Intimate Partner Violence: Not on file          Current Outpatient Medications  Medication Sig Dispense Refill  . acetaminophen-codeine (TYLENOL #3) 300-30 MG tablet Take 1 tablet by mouth every 8 (eight) hours as needed for moderate pain. 30 tablet 0  . amoxicillin (AMOXIL) 500 MG capsule Take 1 capsule (500 mg total) by mouth 3 (three) times daily for 7 days. 21 capsule 0  . aspirin EC 81 MG EC tablet Take 1 tablet (81 mg total) by mouth daily. Swallow whole. 30 tablet 11  . atorvastatin (LIPITOR) 10 MG tablet Take 1 tablet (10 mg total) by mouth daily.    . cyclobenzaprine (FLEXERIL) 5 MG tablet Take 1 tablet (5 mg total) by mouth 3 (three) times daily as needed for muscle spasms. 30 tablet 0  . docusate sodium (COLACE) 100 MG capsule Take 1 capsule (100 mg total) by mouth 2 (two) times daily. 60 capsule 0  . doxycycline (VIBRA-TABS) 100 MG tablet Take 1 tablet (100 mg total) by mouth 2 (two) times daily. 60 tablet 0   . fenofibrate (TRICOR) 48 MG tablet Take 1 tablet (48 mg total) by mouth daily.    . furosemide (LASIX) 20 MG tablet Take 1 tablet (20 mg total) by mouth daily. 30 tablet `  . glyBURIDE (DIABETA) 5 MG tablet Take 1 tablet (5 mg total) by mouth 2 (two) times daily with a meal.    . insulin aspart (NOVOLOG) 100 UNIT/ML injection Inject 0-30 Units into the skin 3 (three) times daily as needed for high blood sugar. Sliding scale 10 mL 11  . insulin  detemir (LEVEMIR) 100 UNIT/ML FlexPen Inject 10 Units into the skin daily. (Patient taking differently: Inject 10 Units into the skin in the morning.) 15 mL 1  . Insulin Pen Needle (BD PEN NEEDLE NANO 2ND GEN) 32G X 4 MM MISC Use daily for glucose control 100 each 3  . Insulin Pen Needle 32G X 4 MM MISC Use as directed with insulin pen 100 each 1  . metoprolol tartrate (LOPRESSOR) 50 MG tablet Take as directed prior to 12/16 CT scan 1 tablet 0  . mycophenolate (MYFORTIC) 360 MG TBEC EC tablet Take 1 tablet (360 mg total) by mouth 2 (two) times daily. 120 tablet   . omeprazole (PRILOSEC) 20 MG capsule Take 1 capsule (20 mg total) by mouth daily. 90 capsule 3  . ONETOUCH VERIO test strip 1 each by Other route 3 (three) times daily. 100 each 12  . polyethylene glycol (MIRALAX / GLYCOLAX) 17 g packet Take 17 g by mouth daily as needed for mild constipation. 14 each 0  . tacrolimus (PROGRAF) 1 MG capsule Take 1 capsule (1 mg total) by mouth 2 (two) times daily.    . traMADol (ULTRAM) 50 MG tablet Take 1 tablet (50 mg total) by mouth every 12 (twelve) hours as needed for moderate pain. 30 tablet 0  . traZODone (DESYREL) 50 MG tablet Take 1 tablet (50 mg total) by mouth at bedtime. 30 tablet 0   No current facility-administered medications for this visit.        Allergies  Allergen Reactions  . Sulfa Antibiotics Rash      Review of Systems:              General:                      normal appetite, + decreased energy, no weight gain, no  weight loss, no fever             Cardiac:                       no chest pain with exertion, no chest pain at rest, noSOB with  exertion, no resting SOB, no PND, no orthopnea, no palpitations, no arrhythmia, no atrial fibrillation, no LE edema, no dizzy spells, no syncope             Respiratory:                 no shortness of breath, no home oxygen, no productive cough, no dry cough, no bronchitis, no wheezing, no hemoptysis, no asthma, no pain with inspiration or cough, no sleep apnea, no CPAP at night             GI:                               no difficulty swallowing, no reflux, no frequent heartburn, no hiatal hernia, no abdominal pain, no constipation, no diarrhea, no hematochezia, no hematemesis, no melena             GU:                              no dysuria,  no frequency, no urinary tract infection, no hematuria, no enlarged prostate, no kidney stones, + kidney disease             Vascular:  no pain suggestive of claudication, no pain in feet, no leg cramps, no varicose veins, no DVT, + hx of non-healing foot ulcer prior to amputation             Neuro:                         no stroke, no TIA's, no seizures, no headaches, no temporary blindness one eye,  no slurred speech, no peripheral neuropathy, no chronic pain, + instability of gait, no memory/cognitive dysfunction             Musculoskeletal:         no arthritis, no joint swelling, no myalgias, + difficulty walking, + reduced mobility              Skin:                            no rash, no itching, no skin infections, no pressure sores or ulcerations             Psych:                         no anxiety, no depression, no nervousness, no unusual recent stress             Eyes:                           no blurry vision, no floaters, no recent vision changes, does not wear glasses or contacts             ENT:                            no hearing loss, no loose or painful teeth, no dentures, last saw dentist  recently when he had all remaining teeth extracted by Dr. Jerilynn Mages. Owsley on 08/06/2020.             Hematologic:               no easy bruising, no abnormal bleeding, no clotting disorder, no frequent epistaxis             Endocrine:                   + diabetes, does check CBG's at home                             Physical Exam:              BP 124/77 (BP Location: Right Arm, Patient Position: Sitting)   Pulse 73   Resp 20   Ht '5\' 11"'$  (1.803 m)   SpO2 99% Comment: RA with mask on  BMI 32.08 kg/m              General:                      well-appearing in wheel chair             HEENT:                       Unremarkable             Neck:  no JVD, no bruits, no adenopathy              Chest:                          clear to auscultation, symmetrical breath sounds, no wheezes, no rhonchi              CV:                              RRR, grade lll/VI crescendo/decrescendo murmur heard best at RSB,  no diastolic murmur             Abdomen:                    soft, non-tender, no masses              Extremities:                 warm, well-perfused, pulses palpable right ankle, L BKA, no LE edema             Rectal/GU                   Deferred             Neuro:                         Grossly non-focal and symmetrical throughout             Skin:                            Clean and dry, no rashes, no breakdown   Diagnostic Tests:  ECHOCARDIOGRAM REPORT       Patient Name:  Steven [name] Date of Exam: 06/04/2020  Medical Rec #: [id number]      Height:    71.0 in  Accession #:  [id number]     Weight:    245.8 lb  Date of Birth: [birth year]      BSA:     2.302 m  Patient Age:  41 years      BP:      115/67 mmHg  Patient Gender: M          HR:      67 bpm.  Exam Location: Inpatient   Procedure: 2D Echo, 3D Echo, Cardiac Doppler, Color Doppler and  Intracardiac       Opacification  Agent   REPORT CONTAINS CRITICAL RESULT   Indications:  R01.1 Murmur    History:    Patient has no prior history of Echocardiogram  examinations.         Signs/Symptoms:Shortness of Breath and Dyspnea; Risk         Factors:Sleep Apnea, Hypertension and Diabetes. Leg  amputation         on previous day. Kidney transplant.    Sonographer:  Roseanna Rainbow RDCS  Referring Phys: [id number] DAWOOD S [facility]     Sonographer Comments: Technically difficult study due to poor echo windows  and patient is morbidly obese. Image acquisition challenging due to  patient body habitus.  IMPRESSIONS    1. Severely calcified aortic and mitral valves with severe aortic  stenosis and mild mitral stenosis.  2. Left ventricular ejection fraction, by estimation, is 60 to 65%. The  left ventricle has  normal function. The left ventricle has no regional  wall motion abnormalities. There is moderate concentric left ventricular  hypertrophy. Left ventricular  diastolic parameters are consistent with Grade II diastolic dysfunction  (pseudonormalization). Elevated left atrial pressure.  3. Right ventricular systolic function is normal. The right ventricular  size is normal.  4. The mitral valve is normal in structure. Mild mitral valve  regurgitation. Mild mitral stenosis. Moderate mitral annular  calcification.  5. The aortic valve is calcified. There is severe calcifcation of the  aortic valve. There is severe thickening of the aortic valve. Aortic valve  regurgitation is not visualized. Severe aortic valve stenosis. Aortic  valve mean gradient measures 67.0  mmHg.  6. The inferior vena cava is normal in size with greater than 50%  respiratory variability, suggesting right atrial pressure of 3 mmHg.   FINDINGS  Left Ventricle: Left ventricular ejection fraction, by estimation, is 60  to 65%. The left ventricle has normal function. The left ventricle has no   regional wall motion abnormalities. Definity contrast agent was [name] IV  to delineate the left ventricular  endocardial borders. The left ventricular internal cavity size was normal  in size. There is moderate concentric left ventricular hypertrophy. Left  ventricular diastolic parameters are consistent with Grade II diastolic  dysfunction (pseudonormalization).  Elevated left atrial pressure.   Right Ventricle: The right ventricular size is normal. No increase in  right ventricular wall thickness. Right ventricular systolic function is  normal.   Left Atrium: Left atrial size was normal in size.   Right Atrium: Right atrial size was normal in size.   Pericardium: There is no evidence of pericardial effusion.   Mitral Valve: The mitral valve is normal in structure. There is severe  thickening of the mitral valve leaflet(s). There is severe calcification  of the mitral valve leaflet(s). Moderate mitral annular calcification.  Mild mitral valve regurgitation. Mild  mitral valve stenosis. MV peak gradient, 13.7 mmHg. The mean mitral valve  gradient is 4.0 mmHg.   Tricuspid Valve: The tricuspid valve is normal in structure. Tricuspid  valve regurgitation is not demonstrated. No evidence of tricuspid  stenosis.   Aortic Valve: The aortic valve is calcified. There is severe calcifcation  of the aortic valve. There is severe thickening of the aortic valve.  Aortic valve regurgitation is not visualized. Severe aortic stenosis is  present. Aortic valve mean gradient  measures 67.0 mmHg. Aortic valve peak gradient measures 108.4 mmHg. Aortic  valve area, by VTI measures 4.16 cm.   Pulmonic Valve: The pulmonic valve was normal in structure. Pulmonic valve  regurgitation is not visualized. No evidence of pulmonic stenosis.   Aorta: The aortic root is normal in size and structure.   Venous: The inferior vena cava is normal in size with greater than 50%  respiratory variability,  suggesting right atrial pressure of 3 mmHg.   IAS/Shunts: No atrial level shunt detected by color flow Doppler.     LEFT VENTRICLE  PLAX 2D  LVIDd:     4.90 cm   Diastology  LVIDs:     3.70 cm   LV e' medial:  4.43 cm/s  LV PW:     1.60 cm   LV E/e' medial: 35.7  LV IVS:    1.20 cm   LV e' lateral:  5.37 cm/s  LVOT diam:   2.40 cm   LV E/e' lateral: 29.4  LV SV:     525  LV SV Index:  228  LVOT Area:   4.52 cm    LV Volumes (MOD)  LV vol d, MOD A2C: 79.6 ml  LV vol d, MOD A4C: 99.0 ml  LV vol s, MOD A2C: 18.7 ml  LV vol s, MOD A4C: 42.3 ml  LV SV MOD A2C:   60.9 ml  LV SV MOD A4C:   99.0 ml  LV SV MOD BP:   61.7 ml   RIGHT VENTRICLE      IVC  RV S prime:   9.65 cm/s IVC diam: 2.50 cm  TAPSE (M-mode): 2.4 cm   LEFT ATRIUM       Index    RIGHT ATRIUM      Index  LA diam:    3.90 cm 1.69 cm/m RA Area:   15.90 cm  LA Vol (A2C):  57.4 ml 24.94 ml/m RA Volume:  39.40 ml 17.12 ml/m  LA Vol (A4C):  27.0 ml 11.73 ml/m  LA Biplane Vol: 40.0 ml 17.38 ml/m  AORTIC VALVE  AV Area (Vmax):  4.52 cm  AV Area (Vmean):  4.45 cm  AV Area (VTI):   4.16 cm  AV Vmax:      520.60 cm/s  AV Vmean:     371.000 cm/s  AV VTI:      1.262 m  AV Peak Grad:   108.4 mmHg  AV Mean Grad:   67.0 mmHg  LVOT Vmax:     520.00 cm/s  LVOT Vmean:    365.000 cm/s  LVOT VTI:     1.160 m  LVOT/AV VTI ratio: 0.92    AORTA  Ao Root diam: 3.60 cm  Ao Asc diam: 3.30 cm   MITRAL VALVE  MV Area (PHT): 2.37 cm   SHUNTS  MV Peak grad: 13.7 mmHg  Systemic VTI: 1.16 m  MV Mean grad: 4.0 mmHg   Systemic Diam: 2.40 cm  MV Vmax:    1.85 m/s  MV Vmean:   85.8 cm/s  MV Decel Time: 320 msec  MV E velocity: 158.00 cm/s  MV A velocity: 94.90 cm/s  MV E/A ratio: 1.66   Ena Dawley MD  Electronically signed by Ena Dawley MD  Signature Date/Time:  06/04/2020/4:31:23 PM     Physicians  Panel Physicians Referring Physician Case Authorizing Physician  End, Harrell Gave, MD (Primary)      Procedures  RIGHT HEART CATH AND CORONARY ANGIOGRAPHY   Conclusion  Conclusions: 1. Mild to moderate, non-obstructive coronary artery disease with 30-40% mid LAD stenosis. No significant disease identified in the LCx or RCA. 2. Moderately to severely elevated left heart, right heart, and pulmonary artery pressures. 3. Normal cardiac output/index.  Recommendations: 1. Ongoing workup of severe aortic stenosis per valve team. 2. Maintain net even to slightly negative fluid balance today. If renal function remains stable, careful escalation of diuresis is recommended. 3. Medical therapy and risk factor modification to prevent progression of coronary artery disease.  Steven Bush, MD [facility]    Recommendations  Antiplatelet/Anticoag Recommend Aspirin '81mg'$  daily for moderate CAD.  Discharge Date Anticipated discharge date to be determined.   Surgeon Notes    06/03/2020 5:20 PM Op Note signed by Steven Minion, MD    Indications  Nonrheumatic aortic valve stenosis [I35.0 (ICD-10-CM)]   Procedural Details  Technical Details Indication: 65 y.o. year-old man with history of HTN, HLD, IDDM, morbid obesity, OSA, anemia of chronic disease, ESRD s/p renal transplantation (2006) with CKD stage IV, PAD, and severe AS, presenting for evaluation of severe  aortic stenosis.  GFR: 48 ml/min  Procedure: The risks, benefits, complications, treatment options, and expected outcomes were discussed with the patient. The patient and/or family concurred with the proposed plan, giving informed consent. The patient was sedated with IV midazolam and fentanyl. The right wrist wrist and elbow were prepped and draped in a sterile fashion. 1% lidocaine was used for local anesthesia. Ultrasound was used to evaluate the right  brachial vein. It was patent. A micropuncture needle was used to access the right brachial vein under ultrasound guidance. A 21F slender Glidesheath was inserted using modified Seldinger technique. Right heart catheterization was performed by advancing a 21F balloon-tipped catheter through the right heart chambers into the pulmonary capillary wedge position. Pressure measurements and oxygen saturations were obtained. Thermodilution measurements were also performed.  Ultrasound was used to evaluate the right radial artery. It was small and calcified but patent. A micropuncture needle was used to access the right radial artery under ultrasound guidance. Using the modified Seldinger access technique, a 62F slender Glidesheath was placed in the right radial artery. 3 mg Verapamil was [name] through the sheath. Heparin 5,000 units were administered.Selective coronary angiography was performed using a 62F JL3.5 catheter to engage the left coronary artery and a 62F JR4 catheter to engage the right coronary artery. Left heart catheterization was not performed, [name] known severe aortic stenosis.  At the end of the procedure, and additional 2 mg verapamil was [name] via the radial artery sheath. The radial artery sheath was removed and a TR band applied to achieve patent hemostasis. The brachial vein sheath was removed and hemostasis achieved with manual compression. There were no immediate complications. The patient was taken to the recovery area in stable condition.  Estimated blood loss <50 mL.   During this procedure medications were administered to achieve and maintain moderate conscious sedation while the patient's heart rate, blood pressure, and oxygen saturation were continuously monitored and I was present face-to-face 100% of this time.   Medications (Filter: Administrations occurring from 1004 to 1116 on 06/08/20) (important) Continuous medications are totaled by the amount administered until  06/08/20 1116.    midazolam (VERSED) injection (mg) Total dose:  1 mg  Date/Time Rate/Dose/Volume Action   06/08/20 1035 1 mg [name]    fentaNYL (SUBLIMAZE) injection (mcg) Total dose:  25 mcg  Date/Time Rate/Dose/Volume Action   06/08/20 1035 25 mcg [name]    lidocaine (PF) (XYLOCAINE) 1 % injection (mL) Total volume:  5 mL  Date/Time Rate/Dose/Volume Action   06/08/20 1035 5 mL [name]    Radial Cocktail/Verapamil only (mL) Total volume:  20 mL  Date/Time Rate/Dose/Volume Action   06/08/20 1050 10 mL [name]   1108 10 mL [name]    Heparin (Porcine) in NaCl 1000-0.9 UT/500ML-% SOLN (mL) Total volume:  1,000 mL  Date/Time Rate/Dose/Volume Action   06/08/20 1051 500 mL [name]   1051 500 mL [name]    heparin sodium (porcine) injection (Units) Total dose:  5,000 Units  Date/Time Rate/Dose/Volume Action   06/08/20 1055 5,000 Units [name]    iohexol (OMNIPAQUE) 350 MG/ML injection (mL) Total volume:  45 mL  Date/Time Rate/Dose/Volume Action   06/08/20 1114 45 mL [name]    (feeding supplement) PROSource Plus liquid 30 mL (mL) Total dose:  Cannot be calculated* Dosing weight:  111.5  *Administration dose not documented Date/Time Rate/Dose/Volume Action   06/08/20 1004 *Not included in total MAR Hold    0.9 % sodium chloride infusion (mL/hr) Total dose:  Cannot be calculated*  Dosing weight:  111.5  *Administration dose not documented Date/Time Rate/Dose/Volume Action   06/08/20 1004 *Not included in total MAR Hold    atorvastatin (LIPITOR) tablet 10 mg (mg) Total dose:  Cannot be calculated*  *Administration dose not documented Date/Time Rate/Dose/Volume Action   06/08/20 1004 *Not included in total MAR Hold    docusate sodium (COLACE) capsule 100 mg (mg) Total dose:  Cannot be calculated* Dosing weight:  111.5  *Administration dose not documented Date/Time Rate/Dose/Volume Action   06/08/20 1004 *Not included in  total MAR Hold    fenofibrate tablet 54 mg (mg) Total dose:  Cannot be calculated*  *Administration dose not documented Date/Time Rate/Dose/Volume Action   06/08/20 1004 *Not included in total MAR Hold    furosemide (LASIX) tablet 40 mg (mg) Total dose:  Cannot be calculated*  *Administration dose not documented Date/Time Rate/Dose/Volume Action   06/08/20 1004 *Not included in total MAR Hold    glyBURIDE (DIABETA) tablet 5 mg (mg) Total dose:  Cannot be calculated* Dosing weight:  111.5  *Administration dose not documented Date/Time Rate/Dose/Volume Action   06/08/20 1004 *Not included in total MAR Hold    heparin injection 5,000 Units (Units) Total dose:  Cannot be calculated*  *Administration dose not documented Date/Time Rate/Dose/Volume Action   06/08/20 1004 *Not included in total MAR Hold    HYDROmorphone (DILAUDID) injection 0.5 mg (mg) Total dose:  Cannot be calculated* Dosing weight:  111.5  *Administration dose not documented Date/Time Rate/Dose/Volume Action   06/08/20 1004 *Not included in total MAR Hold    insulin aspart (novoLOG) injection 0-15 Units (Units) Total dose:  Cannot be calculated* Dosing weight:  111.5  *Administration dose not documented Date/Time Rate/Dose/Volume Action   06/08/20 1004 *Not included in total MAR Hold    insulin aspart (novoLOG) injection 6 Units (Units) Total dose:  Cannot be calculated* Dosing weight:  111.5  *Administration dose not documented Date/Time Rate/Dose/Volume Action   06/08/20 1004 *Not included in total MAR Hold    insulin detemir (LEVEMIR) injection 10 Units (Units) Total dose:  Cannot be calculated* Dosing weight:  111.5  *Administration dose not documented Date/Time Rate/Dose/Volume Action   06/08/20 1004 *Not included in total MAR Hold    insulin detemir (LEVEMIR) injection 15 Units (Units) Total dose:  Cannot be calculated* Dosing weight:   111.5  *Administration dose not documented Date/Time Rate/Dose/Volume Action   06/08/20 1004 *Not included in total MAR Hold    mycophenolate (MYFORTIC) EC tablet 360 mg (mg) Total dose:  Cannot be calculated* Dosing weight:  111.5  *Administration dose not documented Date/Time Rate/Dose/Volume Action   06/08/20 1004 *Not included in total MAR Hold    oxyCODONE (Oxy IR/ROXICODONE) immediate release tablet 5 mg (mg) Total dose:  Cannot be calculated*  *Administration dose not documented Date/Time Rate/Dose/Volume Action   06/08/20 1004 *Not included in total MAR Hold    pantoprazole (PROTONIX) EC tablet 40 mg (mg) Total dose:  Cannot be calculated*  *Administration dose not documented Date/Time Rate/Dose/Volume Action   06/08/20 1004 *Not included in total MAR Hold    polyethylene glycol (MIRALAX / GLYCOLAX) packet 17 g (g) Total dose:  Cannot be calculated*  *Administration dose not documented Date/Time Rate/Dose/Volume Action   06/08/20 1004 *Not included in total MAR Hold    sodium chloride flush (NS) 0.9 % injection 3 mL (mL) Total dose:  Cannot be calculated* Dosing weight:  111.5  *Administration dose not documented Date/Time Rate/Dose/Volume Action   06/08/20 1004 *Not included in  total MAR Hold    tacrolimus (PROGRAF) capsule 1 mg (mg) Total dose:  Cannot be calculated* Dosing weight:  111.5  *Administration dose not documented Date/Time Rate/Dose/Volume Action   06/08/20 1004 *Not included in total MAR Hold    Sedation Time  Sedation Time Physician-1: 33 minutes 50 seconds   Contrast  Medication Name Total Dose  iohexol (OMNIPAQUE) 350 MG/ML injection 45 mL    Radiation/Fluoro  Fluoro time: 5.8 (min) DAP: 26.9 (Gycm2) Cumulative Air Kerma: 123456 (mGy)   Complications   Complications documented before study signed (06/08/2020 0000000 AM)    No complications were associated with this study.   Documented by Steven Bush, MD - 06/08/2020 11:53 AM     Coronary Findings   Diagnostic Dominance: Right  Left Main  Vessel is large. Vessel is angiographically normal. Short LMCA.  Left Anterior Descending  Mid LAD lesion is 35% stenosed.  First Diagonal Branch  Vessel is moderate in size.  Second Diagonal Branch  Vessel is moderate in size.  Left Circumflex  Vessel is large. Vessel is angiographically normal.  First Obtuse Marginal Branch  Vessel is moderate in size.  Second Obtuse Marginal Branch  Vessel is small in size.  Third Obtuse Marginal Branch  Vessel is moderate in size.  Right Coronary Artery  Vessel is large. The vessel exhibits minimal luminal irregularities.  Right Posterior Descending Artery  Vessel is moderate in size.  Right Posterior Atrioventricular Artery  Vessel is moderate in size.   Intervention   No interventions have been documented.  Right Heart  Right Heart Pressures RA (mean): 14 mmHg RV (S/EDP): 60/14 mmHg PA (S/D, mean): 60/33 (42) mmHg PCWP (mean): 30 mmHg  Ao sat: 99% PA sat: 60%  Fick CO: 6.7 L/min Fick CI: 2.9 L/min/m^2  Thermodilution CO: 7.3 L/min Thermodilution CI: 3.2 L/min/m^2   Coronary Diagrams   Diagnostic Dominance: Right    Intervention    Implants       No implant documentation for this case.   Syngo Images  Show images for CARDIAC CATHETERIZATION  Images on Long Term Storage  Show images for Zachery, Franklyn "Alvester Chou"  Link to Procedure Log  Procedure Log     Hemo Data  Flowsheet Row Most Recent Value  Fick Cardiac Output 6.7 L/min  Fick Cardiac Output Index 2.92 (L/min)/BSA  Thermal Cardiac Output 7.31 L/min  Thermal Cardiac Output Index 3.18 (L/min)/BSA  RA A Wave 14 mmHg  RA V Wave 12 mmHg  RA Mean 9 mmHg  RV Systolic Pressure 50 mmHg  RV Diastolic Pressure 5 mmHg  RV EDP 12 mmHg  PA Systolic Pressure 54 mmHg  PA Diastolic Pressure 24 mmHg   PA Mean 38 mmHg  PW A Wave 23 mmHg  PW V Wave 28 mmHg  PW Mean 24 mmHg  TPVR Index 11.95 HRUI    ADDENDUM REPORT: 07/16/2020 23:28  CLINICAL DATA: Pre-op transcatheter aortic valve replacement (TAVR)  EXAM: Cardiac TAVR CT  TECHNIQUE: The patient was scanned on a Siemens Force [id number] slice scanner. A 120 kV retrospective scan was triggered in the descending thoracic aorta at 111 HU's. Gantry rotation speed was 270 msecs and collimation was .9 mm. The 3D data set was reconstructed in 5% intervals of the R-R cycle. Systolic and diastolic phases were analyzed on a dedicated work station using MPR, MIP and VRT modes. The patient received 134m OMNIPAQUE IOHEXOL 350 MG/ML SOLN of contrast.  FINDINGS: Aortic Valve: Tricuspid aortic valve. Severely reduced cusp separation.  Severely thickened, severely calcified aortic valve cusps.  AV calcium score: 3748  Virtual Basal Annulus Measurements:  Maximum/Minimum Diameter: 29.3 x 21 mm  Perimeter: 78.3 mm  Area: 460 cm2  LVOT calcification extending inferiorly from left coronary cusp and protruding into annulus.  Based on these measurements, the annulus would be suitable for a 26 mm Sapien 3 valve.  Sinus of Valsalva Measurements:  Non-coronary: 31 mm  Right - coronary: 32 mm  Left - coronary: 33 mm  Sinus of Valsalva Height:  Left: 19.6 mm  Right: 22.4 mm  Aorta: Conventional 3 vessel branch pattern of aortic arch. Mild aortic atherosclerosis.  Sinotubular Junction: 27 mm  Ascending Thoracic Aorta: 33 mm  Aortic Arch: 27 mm  Descending Thoracic Aorta: 21 mm  Coronary Artery Height above Annulus:  Left Main: 12 mm  Right Coronary: 17.1 mm  Coronary Arteries: Normal coronary origins with right dominance. Coronary calcium score of 443. This was 86th percentile for age and sex matched control. Study not performed to assess coronary artery lumen.  Optimum Fluoroscopic  Angle for Delivery: LAO 1, CAU 2  IMPRESSION: 1. Aortic Valve: Tricuspid aortic valve. Severely reduced cusp separation. Severely thickened, severely calcified aortic valve cusps.  2. AV calcium score: 3748  3. LVOT calcification extending inferiorly from left coronary cusp and protruding into annulus.  4. Aortic annulus is 460 mm2, suitable for a 26 mm Sapien 3 valve.  5. Adequate coronary artery height from annulus.  6. Optimum Fluoroscopic Angle for Delivery: LAO 1, CAU 2   Electronically Signed By: Cherlynn Kaiser On: 07/16/2020 23:28   Addended by Elouise Munroe, MD on 07/16/2020 11:31 PM    Study Result  Narrative & Impression  EXAM: OVER-READ INTERPRETATION CT CHEST  The following report is an over-read performed by radiologist Dr. Vinnie Langton of [facility], [place] on 07/16/2020. This over-read does not include interpretation of cardiac or coronary anatomy or pathology. The coronary calcium score/coronary CTA interpretation by the cardiologist is attached.  COMPARISON: Chest CT 07/13/2007.  FINDINGS: Extracardiac findings will be described separately under dictation for contemporaneously obtained CTA chest, abdomen and pelvis.  IMPRESSION: Please see separate dictation for contemporaneously obtained CTA chest, abdomen and pelvis dated 07/16/2020 for full description of relevant extracardiac findings.  Electronically Signed: By: Vinnie Langton M.D. On: 07/16/2020 12:20     Narrative & Impression  CLINICAL DATA: 65 year old male with history of severe aortic stenosis. Preprocedural study prior to potential transcatheter aortic valve replacement (TAVR) procedure.  EXAM: CT ANGIOGRAPHY CHEST, ABDOMEN AND PELVIS  TECHNIQUE: Multidetector CT imaging through the chest, abdomen and pelvis was performed using the standard protocol during bolus administration of intravenous contrast. Multiplanar  reconstructed images and MIPs were obtained and reviewed to evaluate the vascular anatomy.  CONTRAST: 114m OMNIPAQUE IOHEXOL 350 MG/ML SOLN  COMPARISON: None.  FINDINGS: CTA CHEST FINDINGS  Cardiovascular: Heart size is mildly enlarged. There is no significant pericardial fluid, thickening or pericardial calcification. There is aortic atherosclerosis, as well as atherosclerosis of the great vessels of the mediastinum and the coronary arteries, including calcified atherosclerotic plaque in the left main, left anterior descending, left circumflex and right coronary arteries. Severe thickening calcification of the aortic valve. Moderate to severe calcifications of the mitral annulus. Dilatation of the pulmonic trunk (3.5 cm in diameter).  Mediastinum/Lymph Nodes: No pathologically enlarged mediastinal or hilar lymph nodes. Multiple prominent borderline enlarged mediastinal and right hilar lymph nodes are incidentally noted (nonspecific). Esophagus is unremarkable in appearance. No axillary lymphadenopathy.  Lungs/Pleura: Mosaic attenuation in the lungs bilaterally, suggesting areas of air trapping from small airways disease. No acute consolidative airspace disease. No pleural effusions. No definite suspicious appearing pulmonary nodules or masses are noted.  Musculoskeletal/Soft Tissues: There are no aggressive appearing lytic or blastic lesions noted in the visualized portions of the skeleton.  CTA ABDOMEN AND PELVIS FINDINGS  Hepatobiliary: No suspicious cystic or solid hepatic lesions. No intra or extrahepatic biliary ductal dilatation. Gallbladder is normal in appearance.  Pancreas: No pancreatic mass. No pancreatic ductal dilatation. No pancreatic or peripancreatic fluid collections or inflammatory changes. Coarse calcifications in the proximal body of the pancreas likely sequela of chronic pancreatitis.  Spleen: Unremarkable.  Adrenals/Urinary Tract:  Severe atrophy of the native kidneys bilaterally. Bilateral adrenal glands are normal in appearance. No hydroureteronephrosis. Urinary bladder is normal in appearance. Transplant kidney in the right renal fossa is normal in appearance, without evidence of perinephric fluid collections or other acute features.  Stomach/Bowel: The appearance of the stomach is normal. There is no pathologic dilatation of small bowel or colon. Normal appendix.  Vascular/Lymphatic: Aortic atherosclerosis, with vascular findings and measurements pertinent to potential TAVR procedure, as detailed below. No aneurysm or dissection noted in the abdominal or pelvic vasculature. No lymphadenopathy noted in the abdomen or pelvis.  Reproductive: Prostate gland and seminal vesicles are unremarkable in appearance.  Other: No significant volume of ascites. No pneumoperitoneum.  Musculoskeletal: There are no aggressive appearing lytic or blastic lesions noted in the visualized portions of the skeleton.  VASCULAR MEASUREMENTS PERTINENT TO TAVR:  AORTA:  Minimal Aortic Diameter-14 x 13 mm  Severity of Aortic Calcification-mild-to-moderate  RIGHT PELVIS:  Right Common Iliac Artery -  Minimal Diameter-8.5 x 9.7 mm  Tortuosity - mild  Calcification-moderate  Right External Iliac Artery -  Minimal Diameter-7.7 x 7.7 mm  Tortuosity - mild  Calcification-mild  Right Common Femoral Artery -  Minimal Diameter-7.6 x 6.6 mm  Tortuosity - mild  Calcification-mild  LEFT PELVIS:  Left Common Iliac Artery -  Minimal Diameter-8.5 x 8.7 mm  Tortuosity - mild  Calcification-mild  Left External Iliac Artery -  Minimal Diameter-7.5 x 7.3 mm  Tortuosity - mild  Calcification-mild  Left Common Femoral Artery -  Minimal Diameter-8.1 x 6.3 mm  Tortuosity-mild  Calcification-mild  Review of the MIP images confirms the above findings.  IMPRESSION: 1.  Vascular findings and measurements pertinent to potential TAVR procedure, as detailed above. 2. Severe thickening calcification of the aortic valve, compatible with reported clinical history of severe aortic stenosis. 3. There is also moderate to severe calcifications of the mitral annulus. 4. Aortic atherosclerosis, in addition to left main and 3 vessel coronary artery disease. Please note that although the presence of coronary artery calcium documents the presence of coronary artery disease, the severity of this disease and any potential stenosis cannot be assessed on this non-gated CT examination. Assessment for potential risk factor modification, dietary therapy or pharmacologic therapy may be warranted, if clinically indicated. 5. Dilatation of the pulmonic trunk (3.5 cm in diameter), concerning for potential pulmonary arterial hypertension. 6. Mosaic attenuation in the lung parenchyma suggesting of areas of air trapping from small airways disease. Further evaluation with noncontrast high-resolution chest CT is recommended in 3 months to better evaluate these findings. 7. Transplant kidney in the right iliac fossa with anastomosis to the right external iliac artery. 8. Additional incidental findings, as above.   Electronically Signed By: Vinnie Langton M.D. On: 07/17/2020 11:09    STS Risk Calculator:  Procedure: Isolated AVR Risk of Mortality: 2.319% Renal Failure: 13.059% Permanent Stroke: 1.244% Prolonged Ventilation: 8.441% DSW Infection: 0.532% Reoperation: 4.473% Morbidity or Mortality: 23.012% Short Length of Stay: 34.907% Long Length of Stay: 9.339%  _____________________  Impression:  This 65 year old gentleman has stage D, severe, symptomatic aortic stenosis with New York Heart Association class II symptoms of exertional fatigue and shortness of breath consistent with chronic diastolic congestive heart failure.  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  He had an elevated BNP and interstitial edema on chest x-ray at that time.  I have personally reviewed his 2D echocardiogram, cardiac catheterization, and CTA studies.  His echocardiogram shows a severely calcified aortic valve with markedly restricted mobility.  The mean gradient across the valve is 67 mmHg consistent with critical aortic stenosis.  Left ventricular ejection fraction was normal with moderate LVH and grade 2 diastolic dysfunction.  There is mild mitral stenosis and regurgitation cardiac catheterization showed mild nonobstructive coronary disease with markedly elevated left and right heart pressures.  I agree that aortic valve replacement is indicated in this patient for relief of his symptoms and to prevent progressive left ventricular deterioration.  He has done fairly well at home recovering from his amputation and recently underwent extraction of his remaining teeth. He is 65 years old but [name] his comorbid risk factors of obesity, poorly controlled diabetes, recent left BKA with immobility, renal transplant with stage III chronic kidney disease, nutritional deficit with an albumin of 2.5 and a prealbumin of 16.1, I think the best option for him would be transcatheter aortic valve replacement.  The potential benefit of open surgical AVR does not outweigh the risk in this particular patient.  I think he would be at significant risk for worsening renal failure, poor wound healing, and complications related to immobility with a recent left BKA including sternal wound problems.  I discussed all of this with him and he is in agreement with transcatheter aortic valve placement.  I discussed the increased risk of structural valve deterioration and lack of long-term durability studies with TAVR.  His gated cardiac CTA shows anatomy suitable for transcatheter aortic valve  replacement using a SAPIEN 3 valve.  There is some calcification below the left coronary cusp extending into the annulus and LV outflow tract which could increase the risk of paravalvular leak.  His abdominal and pelvic CTA shows adequate pelvic vascular anatomy to allow transfemoral insertion.  It would probably be best to insert this through the left femoral artery since his renal transplant is anastomosed to the right external iliac artery.  The patient and his mother were counseled at length regarding treatment alternatives for management of severe symptomatic aortic stenosis. The risks and benefits of surgical intervention has been discussed in detail. Long-term prognosis with medical therapy was discussed. Alternative approaches such as conventional surgical aortic valve replacement, transcatheter aortic valve replacement, and palliative medical therapy were compared and contrasted at length. This discussion was placed in the context of the patient's own specific clinical presentation and past medical history. All of their questions have been addressed.   Following the decision to proceed with transcatheter aortic valve replacement, a discussion was held regarding what types of management strategies would be attempted intraoperatively in the event of life-threatening complications, including whether or not the patient would be considered a candidate for the use of cardiopulmonary bypass and/or conversion to open sternotomy for attempted surgical  intervention. The patient is aware of the fact that transient use of cardiopulmonary bypass may be necessary.  He would be a candidate for emergent sternotomy if needed to manage any intraoperative complications.  The patient has been advised of a variety of complications that might develop including but not limited to risks of death, stroke, paravalvular leak, aortic dissection or other major vascular complications, aortic annulus rupture, device embolization,  cardiac rupture or perforation, mitral regurgitation, acute myocardial infarction, arrhythmia, heart block or bradycardia requiring permanent pacemaker placement, congestive heart failure, respiratory failure, renal failure, pneumonia, infection, other late complications related to structural valve deterioration or migration, or other complications that might ultimately cause a temporary or permanent loss of functional independence or other long term morbidity. The patient provides full informed consent for the procedure as described and all questions were answered.      Plan:  Transfemoral transcatheter aortic valve replacement using a SAPIEN 3 valve.    Gaye Pollack, MD

## 2020-09-08 ENCOUNTER — Encounter (HOSPITAL_COMMUNITY)
Admission: RE | Disposition: A | Discharge status: Home / Self Care | Payer: BC Managed Care – PPO | Source: Home / Self Care | Attending: Cardiovascular Disease

## 2020-09-08 ENCOUNTER — Inpatient Hospital Stay (HOSPITAL_COMMUNITY): Payer: BC Managed Care – PPO

## 2020-09-08 ENCOUNTER — Inpatient Hospital Stay (HOSPITAL_COMMUNITY): Payer: BC Managed Care – PPO | Admitting: Certified Registered"

## 2020-09-08 ENCOUNTER — Other Ambulatory Visit: Payer: Self-pay | Admitting: Physician Assistant

## 2020-09-08 ENCOUNTER — Other Ambulatory Visit: Payer: Self-pay

## 2020-09-08 ENCOUNTER — Inpatient Hospital Stay (HOSPITAL_COMMUNITY)
Admission: RE | Admit: 2020-09-08 | Discharge: 2020-09-10 | DRG: 266 | Disposition: A | Discharge status: Home / Self Care | Payer: BC Managed Care – PPO | Attending: Cardiovascular Disease | Admitting: Cardiovascular Disease

## 2020-09-08 ENCOUNTER — Encounter (HOSPITAL_COMMUNITY): Payer: Self-pay | Admitting: Cardiovascular Disease

## 2020-09-08 DIAGNOSIS — Z940 Kidney transplant status: Secondary | ICD-10-CM

## 2020-09-08 DIAGNOSIS — I447 Left bundle-branch block, unspecified: Secondary | ICD-10-CM | POA: Diagnosis present

## 2020-09-08 DIAGNOSIS — T8619 Other complication of kidney transplant: Secondary | ICD-10-CM | POA: Diagnosis present

## 2020-09-08 DIAGNOSIS — Z20822 Contact with and (suspected) exposure to covid-19: Secondary | ICD-10-CM | POA: Diagnosis present

## 2020-09-08 DIAGNOSIS — N1832 Chronic kidney disease, stage 3b: Secondary | ICD-10-CM | POA: Diagnosis present

## 2020-09-08 DIAGNOSIS — G8929 Other chronic pain: Secondary | ICD-10-CM | POA: Diagnosis present

## 2020-09-08 DIAGNOSIS — Z794 Long term (current) use of insulin: Secondary | ICD-10-CM

## 2020-09-08 DIAGNOSIS — N179 Acute kidney failure, unspecified: Secondary | ICD-10-CM | POA: Diagnosis present

## 2020-09-08 DIAGNOSIS — Z6832 Body mass index (BMI) 32.0-32.9, adult: Secondary | ICD-10-CM | POA: Diagnosis not present

## 2020-09-08 DIAGNOSIS — I440 Atrioventricular block, first degree: Secondary | ICD-10-CM | POA: Diagnosis present

## 2020-09-08 DIAGNOSIS — E1122 Type 2 diabetes mellitus with diabetic chronic kidney disease: Secondary | ICD-10-CM | POA: Diagnosis present

## 2020-09-08 DIAGNOSIS — Z7982 Long term (current) use of aspirin: Secondary | ICD-10-CM

## 2020-09-08 DIAGNOSIS — G4733 Obstructive sleep apnea (adult) (pediatric): Secondary | ICD-10-CM | POA: Diagnosis present

## 2020-09-08 DIAGNOSIS — I5033 Acute on chronic diastolic (congestive) heart failure: Secondary | ICD-10-CM | POA: Diagnosis present

## 2020-09-08 DIAGNOSIS — Z79899 Other long term (current) drug therapy: Secondary | ICD-10-CM | POA: Diagnosis not present

## 2020-09-08 DIAGNOSIS — I350 Nonrheumatic aortic (valve) stenosis: Principal | ICD-10-CM

## 2020-09-08 DIAGNOSIS — Z89512 Acquired absence of left leg below knee: Secondary | ICD-10-CM

## 2020-09-08 DIAGNOSIS — I130 Hypertensive heart and chronic kidney disease with heart failure and stage 1 through stage 4 chronic kidney disease, or unspecified chronic kidney disease: Secondary | ICD-10-CM | POA: Diagnosis present

## 2020-09-08 DIAGNOSIS — Z006 Encounter for examination for normal comparison and control in clinical research program: Secondary | ICD-10-CM

## 2020-09-08 DIAGNOSIS — K219 Gastro-esophageal reflux disease without esophagitis: Secondary | ICD-10-CM | POA: Diagnosis present

## 2020-09-08 DIAGNOSIS — N2581 Secondary hyperparathyroidism of renal origin: Secondary | ICD-10-CM | POA: Diagnosis present

## 2020-09-08 DIAGNOSIS — E119 Type 2 diabetes mellitus without complications: Secondary | ICD-10-CM

## 2020-09-08 DIAGNOSIS — N183 Chronic kidney disease, stage 3 unspecified: Secondary | ICD-10-CM | POA: Diagnosis present

## 2020-09-08 DIAGNOSIS — Y830 Surgical operation with transplant of whole organ as the cause of abnormal reaction of the patient, or of later complication, without mention of misadventure at the time of the procedure: Secondary | ICD-10-CM | POA: Diagnosis present

## 2020-09-08 DIAGNOSIS — E66813 Obesity, class 3: Secondary | ICD-10-CM | POA: Diagnosis present

## 2020-09-08 DIAGNOSIS — E785 Hyperlipidemia, unspecified: Secondary | ICD-10-CM | POA: Diagnosis present

## 2020-09-08 DIAGNOSIS — D696 Thrombocytopenia, unspecified: Secondary | ICD-10-CM | POA: Diagnosis present

## 2020-09-08 DIAGNOSIS — Z882 Allergy status to sulfonamides status: Secondary | ICD-10-CM | POA: Diagnosis not present

## 2020-09-08 DIAGNOSIS — I10 Essential (primary) hypertension: Secondary | ICD-10-CM | POA: Diagnosis present

## 2020-09-08 DIAGNOSIS — D649 Anemia, unspecified: Secondary | ICD-10-CM | POA: Diagnosis present

## 2020-09-08 DIAGNOSIS — E1142 Type 2 diabetes mellitus with diabetic polyneuropathy: Secondary | ICD-10-CM | POA: Diagnosis present

## 2020-09-08 DIAGNOSIS — Z952 Presence of prosthetic heart valve: Secondary | ICD-10-CM

## 2020-09-08 HISTORY — PX: INTRAOPERATIVE TRANSTHORACIC ECHOCARDIOGRAM: SHX6523

## 2020-09-08 HISTORY — PX: TRANSCATHETER AORTIC VALVE REPLACEMENT, TRANSFEMORAL: SHX6400

## 2020-09-08 HISTORY — DX: Presence of prosthetic heart valve: Z95.2

## 2020-09-08 LAB — POCT I-STAT, CHEM 8
BUN: 31 mg/dL — ABNORMAL HIGH (ref 8–23)
BUN: 33 mg/dL — ABNORMAL HIGH (ref 8–23)
Calcium, Ion: 1.26 mmol/L (ref 1.15–1.40)
Calcium, Ion: 1.19 mmol/L (ref 1.15–1.40)
Chloride: 100 mmol/L (ref 98–111)
Chloride: 102 mmol/L (ref 98–111)
Creatinine, Ser: 1.5 mg/dL — ABNORMAL HIGH (ref 0.61–1.24)
Creatinine, Ser: 1.6 mg/dL — ABNORMAL HIGH (ref 0.61–1.24)
Glucose, Bld: 218 mg/dL — ABNORMAL HIGH (ref 70–99)
Glucose, Bld: 243 mg/dL — ABNORMAL HIGH (ref 70–99)
HCT: 31 % — ABNORMAL LOW (ref 39.0–52.0)
HCT: 28 % — ABNORMAL LOW (ref 39.0–52.0)
Hemoglobin: 10.5 g/dL — ABNORMAL LOW (ref 13.0–17.0)
Hemoglobin: 9.5 g/dL — ABNORMAL LOW (ref 13.0–17.0)
Potassium: 3.7 mmol/L (ref 3.5–5.1)
Potassium: 3.8 mmol/L (ref 3.5–5.1)
Sodium: 137 mmol/L (ref 135–145)
Sodium: 136 mmol/L (ref 135–145)
TCO2: 21 mmol/L — ABNORMAL LOW (ref 22–32)
TCO2: 22 mmol/L (ref 22–32)

## 2020-09-08 LAB — ECHOCARDIOGRAM LIMITED
AR max vel: 3.83 cm2
AV Area VTI: 3.5 cm2
AV Area mean vel: 3.71 cm2
AV Mean grad: 5 mmHg
AV Peak grad: 7.5 mmHg
Ao pk vel: 1.37 m/s

## 2020-09-08 LAB — GLUCOSE, CAPILLARY
Glucose-Capillary: 198 mg/dL — ABNORMAL HIGH (ref 70–99)
Glucose-Capillary: 237 mg/dL — ABNORMAL HIGH (ref 70–99)
Glucose-Capillary: 181 mg/dL — ABNORMAL HIGH (ref 70–99)
Glucose-Capillary: 150 mg/dL — ABNORMAL HIGH (ref 70–99)

## 2020-09-08 LAB — ABO/RH: ABO/RH(D): O POS

## 2020-09-08 SURGERY — IMPLANTATION, AORTIC VALVE, TRANSCATHETER, FEMORAL APPROACH
Anesthesia: Monitor Anesthesia Care | Site: Chest

## 2020-09-08 MED ORDER — SODIUM CHLORIDE 0.9% FLUSH
3.0000 mL | Freq: Two times a day (BID) | INTRAVENOUS | Status: DC
  Administered 2020-09-08 – 2020-09-09 (×2): 3 mL via INTRAVENOUS

## 2020-09-08 MED ORDER — FENOFIBRATE 54 MG PO TABS
54.0000 mg | ORAL_TABLET | Freq: Every day | ORAL | Status: DC
  Administered 2020-09-08 – 2020-09-10 (×3): 54 mg via ORAL
  Filled 2020-09-08 (×3): qty 1

## 2020-09-08 MED ORDER — FENTANYL CITRATE (PF) 100 MCG/2ML IJ SOLN
INTRAMUSCULAR | Status: DC | PRN
  Administered 2020-09-08 (×2): 50 ug via INTRAVENOUS

## 2020-09-08 MED ORDER — TRAZODONE HCL 50 MG PO TABS
50.0000 mg | ORAL_TABLET | Freq: Every day | ORAL | Status: DC
  Administered 2020-09-08 – 2020-09-09 (×2): 50 mg via ORAL
  Filled 2020-09-08 (×2): qty 1

## 2020-09-08 MED ORDER — ONDANSETRON HCL 4 MG/2ML IJ SOLN
INTRAMUSCULAR | Status: DC | PRN
  Administered 2020-09-08: 4 mg via INTRAVENOUS

## 2020-09-08 MED ORDER — ACETAMINOPHEN-CODEINE #3 300-30 MG PO TABS
1.0000 | ORAL_TABLET | Freq: Three times a day (TID) | ORAL | Status: DC | PRN
  Administered 2020-09-09 – 2020-09-10 (×2): 1 via ORAL
  Filled 2020-09-08 (×2): qty 1

## 2020-09-08 MED ORDER — IODIXANOL 320 MG/ML IV SOLN
INTRAVENOUS | Status: DC | PRN
  Administered 2020-09-08: 30 mL via INTRA_ARTERIAL

## 2020-09-08 MED ORDER — INSULIN ASPART 100 UNIT/ML ~~LOC~~ SOLN
0.0000 [IU] | Freq: Three times a day (TID) | SUBCUTANEOUS | Status: DC
  Administered 2020-09-08: 5 [IU] via SUBCUTANEOUS
  Administered 2020-09-08: 3 [IU] via SUBCUTANEOUS
  Administered 2020-09-09: 2 [IU] via SUBCUTANEOUS
  Administered 2020-09-09 – 2020-09-10 (×3): 3 [IU] via SUBCUTANEOUS

## 2020-09-08 MED ORDER — CHLORHEXIDINE GLUCONATE 0.12 % MT SOLN
15.0000 mL | Freq: Once | OROMUCOSAL | Status: AC
  Administered 2020-09-08: 15 mL via OROMUCOSAL
  Filled 2020-09-08: qty 15

## 2020-09-08 MED ORDER — DOCUSATE SODIUM 100 MG PO CAPS
100.0000 mg | ORAL_CAPSULE | Freq: Two times a day (BID) | ORAL | Status: DC
  Administered 2020-09-08 – 2020-09-10 (×5): 100 mg via ORAL
  Filled 2020-09-08 (×5): qty 1

## 2020-09-08 MED ORDER — SODIUM CHLORIDE 0.9 % IV SOLN: INTRAVENOUS | Status: DC

## 2020-09-08 MED ORDER — CYCLOBENZAPRINE HCL 10 MG PO TABS: 5.0000 mg | ORAL_TABLET | Freq: Three times a day (TID) | ORAL | Status: DC | PRN

## 2020-09-08 MED ORDER — ATORVASTATIN CALCIUM 10 MG PO TABS
10.0000 mg | ORAL_TABLET | Freq: Every day | ORAL | Status: DC
  Administered 2020-09-08 – 2020-09-10 (×3): 10 mg via ORAL
  Filled 2020-09-08 (×3): qty 1

## 2020-09-08 MED ORDER — SODIUM CHLORIDE 0.9% FLUSH: 3.0000 mL | INTRAVENOUS | Status: DC | PRN

## 2020-09-08 MED ORDER — SODIUM CHLORIDE 0.9 % IV SOLN: 250.0000 mL | INTRAVENOUS | Status: DC | PRN

## 2020-09-08 MED ORDER — SODIUM CHLORIDE 0.9 % IV SOLN
INTRAVENOUS | Status: DC | PRN
  Administered 2020-09-08: 06:00:00 1500 mL via INTRAMUSCULAR

## 2020-09-08 MED ORDER — PANTOPRAZOLE SODIUM 40 MG PO TBEC
40.0000 mg | DELAYED_RELEASE_TABLET | Freq: Every day | ORAL | Status: DC
  Administered 2020-09-08 – 2020-09-10 (×3): 40 mg via ORAL
  Filled 2020-09-08 (×3): qty 1

## 2020-09-08 MED ORDER — PHENYLEPHRINE HCL-NACL 20-0.9 MG/250ML-% IV SOLN: 0.0000 ug/min | INTRAVENOUS | Status: DC

## 2020-09-08 MED ORDER — ONDANSETRON HCL 4 MG/2ML IJ SOLN
INTRAMUSCULAR | Status: AC
  Filled 2020-09-08: qty 2

## 2020-09-08 MED ORDER — ONDANSETRON HCL 4 MG/2ML IJ SOLN: 4.0000 mg | Freq: Four times a day (QID) | INTRAMUSCULAR | Status: DC | PRN

## 2020-09-08 MED ORDER — FUROSEMIDE 40 MG PO TABS
40.0000 mg | ORAL_TABLET | Freq: Every day | ORAL | Status: DC
  Administered 2020-09-08: 40 mg via ORAL
  Filled 2020-09-08: qty 1

## 2020-09-08 MED ORDER — MIDAZOLAM HCL 2 MG/2ML IJ SOLN
INTRAMUSCULAR | Status: AC
  Filled 2020-09-08: qty 2

## 2020-09-08 MED ORDER — VANCOMYCIN HCL IN DEXTROSE 1-5 GM/200ML-% IV SOLN
1000.0000 mg | Freq: Once | INTRAVENOUS | Status: AC
  Administered 2020-09-08: 1000 mg via INTRAVENOUS
  Filled 2020-09-08: qty 200

## 2020-09-08 MED ORDER — HEPARIN SODIUM (PORCINE) 1000 UNIT/ML IJ SOLN
INTRAMUSCULAR | Status: AC
  Filled 2020-09-08: qty 1

## 2020-09-08 MED ORDER — CHLORHEXIDINE GLUCONATE 4 % EX LIQD: 30.0000 mL | CUTANEOUS | Status: DC

## 2020-09-08 MED ORDER — CHLORHEXIDINE GLUCONATE 4 % EX LIQD: 60.0000 mL | Freq: Once | CUTANEOUS | Status: DC

## 2020-09-08 MED ORDER — ACETAMINOPHEN 500 MG PO TABS: 1000.0000 mg | ORAL_TABLET | Freq: Four times a day (QID) | ORAL | Status: DC | PRN

## 2020-09-08 MED ORDER — MORPHINE SULFATE (PF) 2 MG/ML IV SOLN: 1.0000 mg | INTRAVENOUS | Status: DC | PRN

## 2020-09-08 MED ORDER — METOPROLOL TARTRATE 5 MG/5ML IV SOLN: 2.5000 mg | INTRAVENOUS | Status: DC | PRN

## 2020-09-08 MED ORDER — MIDAZOLAM HCL 5 MG/5ML IJ SOLN
INTRAMUSCULAR | Status: DC | PRN
  Administered 2020-09-08: 2 mg via INTRAVENOUS

## 2020-09-08 MED ORDER — LABETALOL HCL 5 MG/ML IV SOLN
INTRAVENOUS | Status: DC | PRN
  Administered 2020-09-08 (×2): 10 mg via INTRAVENOUS

## 2020-09-08 MED ORDER — LIDOCAINE HCL 1 % IJ SOLN
INTRAMUSCULAR | Status: DC | PRN
  Administered 2020-09-08: 7 mL

## 2020-09-08 MED ORDER — PROTAMINE SULFATE 10 MG/ML IV SOLN
INTRAVENOUS | Status: DC | PRN
  Administered 2020-09-08: 140 mg via INTRAVENOUS

## 2020-09-08 MED ORDER — LACTATED RINGERS IV SOLN: INTRAVENOUS | Status: DC

## 2020-09-08 MED ORDER — SODIUM CHLORIDE 0.9 % IV SOLN
INTRAVENOUS | Status: AC
  Filled 2020-09-08 (×3): qty 1.2

## 2020-09-08 MED ORDER — SODIUM CHLORIDE 0.9 % IV SOLN
1.5000 g | Freq: Two times a day (BID) | INTRAVENOUS | Status: AC
  Administered 2020-09-08 – 2020-09-10 (×4): 1.5 g via INTRAVENOUS
  Filled 2020-09-08 (×4): qty 1.5

## 2020-09-08 MED ORDER — CLOPIDOGREL BISULFATE 75 MG PO TABS
75.0000 mg | ORAL_TABLET | Freq: Every day | ORAL | Status: DC
  Administered 2020-09-09 – 2020-09-10 (×2): 75 mg via ORAL
  Filled 2020-09-08 (×2): qty 1

## 2020-09-08 MED ORDER — LIDOCAINE HCL 1 % IJ SOLN
INTRAMUSCULAR | Status: AC
  Filled 2020-09-08: qty 20

## 2020-09-08 MED ORDER — INSULIN DETEMIR 100 UNIT/ML ~~LOC~~ SOLN
10.0000 [IU] | Freq: Every day | SUBCUTANEOUS | Status: DC
  Administered 2020-09-08 – 2020-09-10 (×3): 10 [IU] via SUBCUTANEOUS
  Filled 2020-09-08 (×3): qty 0.1

## 2020-09-08 MED ORDER — TACROLIMUS 1 MG PO CAPS
1.0000 mg | ORAL_CAPSULE | Freq: Two times a day (BID) | ORAL | Status: DC
  Administered 2020-09-08 – 2020-09-10 (×4): 1 mg via ORAL
  Filled 2020-09-08 (×4): qty 1

## 2020-09-08 MED ORDER — SODIUM CHLORIDE 0.9 % WEIGHT BASED INFUSION
1.0000 mL/kg/h | INTRAVENOUS | Status: DC
  Administered 2020-09-08 (×2): 1 mL/kg/h via INTRAVENOUS

## 2020-09-08 MED ORDER — PROPOFOL 10 MG/ML IV BOLUS
INTRAVENOUS | Status: AC
  Filled 2020-09-08: qty 20

## 2020-09-08 MED ORDER — POLYETHYLENE GLYCOL 3350 17 G PO PACK: 17.0000 g | PACK | Freq: Every day | ORAL | Status: DC | PRN

## 2020-09-08 MED ORDER — FENTANYL CITRATE (PF) 250 MCG/5ML IJ SOLN
INTRAMUSCULAR | Status: AC
  Filled 2020-09-08: qty 5

## 2020-09-08 MED ORDER — HEPARIN SODIUM (PORCINE) 1000 UNIT/ML IJ SOLN
INTRAMUSCULAR | Status: DC | PRN
  Administered 2020-09-08: 14000 [IU] via INTRAVENOUS

## 2020-09-08 MED ORDER — MYCOPHENOLATE SODIUM 180 MG PO TBEC
360.0000 mg | DELAYED_RELEASE_TABLET | Freq: Two times a day (BID) | ORAL | Status: DC
  Administered 2020-09-08 – 2020-09-10 (×4): 360 mg via ORAL
  Filled 2020-09-08 (×4): qty 2

## 2020-09-08 MED ORDER — INSULIN ASPART 100 UNIT/ML ~~LOC~~ SOLN
4.0000 [IU] | Freq: Three times a day (TID) | SUBCUTANEOUS | Status: DC
  Administered 2020-09-08 – 2020-09-10 (×5): 4 [IU] via SUBCUTANEOUS

## 2020-09-08 MED ORDER — TRAMADOL HCL 50 MG PO TABS: 50.0000 mg | ORAL_TABLET | Freq: Two times a day (BID) | ORAL | Status: DC | PRN

## 2020-09-08 MED ORDER — INSULIN DETEMIR 100 UNIT/ML FLEXPEN: 10.0000 [IU] | PEN_INJECTOR | Freq: Every day | SUBCUTANEOUS | Status: DC

## 2020-09-08 MED ORDER — ASPIRIN EC 81 MG PO TBEC
81.0000 mg | DELAYED_RELEASE_TABLET | Freq: Every day | ORAL | Status: DC
  Administered 2020-09-08 – 2020-09-10 (×3): 81 mg via ORAL
  Filled 2020-09-08 (×3): qty 1

## 2020-09-08 MED ORDER — OXYCODONE HCL 5 MG PO TABS: 5.0000 mg | ORAL_TABLET | ORAL | Status: DC | PRN

## 2020-09-08 MED ORDER — NITROGLYCERIN IN D5W 200-5 MCG/ML-% IV SOLN: 0.0000 ug/min | INTRAVENOUS | Status: DC

## 2020-09-08 SURGICAL SUPPLY — 59 items
ADH SKN CLS APL DERMABOND .7 (GAUZE/BANDAGES/DRESSINGS) ×2
ADH SKN CLS LQ APL DERMABOND (GAUZE/BANDAGES/DRESSINGS) ×1
APL PRP STRL LF DISP 70% ISPRP (MISCELLANEOUS) ×1
BAG SNAP BAND KOVER 36X36 (MISCELLANEOUS) ×3 IMPLANT
BALLN TRUE 20X4.5 (BALLOONS) ×3
BALLOON TRUE 20X4.5 (BALLOONS) ×2 IMPLANT
BLADE CLIPPER SURG (BLADE) ×3 IMPLANT
CABLE ADAPT CONN TEMP 6FT (ADAPTER) ×3 IMPLANT
CATH DIAG EXPO 6F VENT PIG 145 (CATHETERS) ×6 IMPLANT
CATH INFINITI 6F AL2 (CATHETERS) ×3 IMPLANT
CATH S G BIP PACING (CATHETERS) ×3 IMPLANT
CHLORAPREP W/TINT 26 (MISCELLANEOUS) ×3 IMPLANT
CLOSURE MYNX CONTROL 6F/7F (Vascular Products) ×3 IMPLANT
CNTNR URN SCR LID CUP LEK RST (MISCELLANEOUS) ×4 IMPLANT
CONT SPEC 4OZ STRL OR WHT (MISCELLANEOUS) ×6
COVER BACK TABLE 80X110 HD (DRAPES) ×3 IMPLANT
DERMABOND ADHESIVE PROPEN (GAUZE/BANDAGES/DRESSINGS) ×1
DERMABOND ADVANCED (GAUZE/BANDAGES/DRESSINGS) ×1
DERMABOND ADVANCED .7 DNX12 (GAUZE/BANDAGES/DRESSINGS) ×2 IMPLANT
DERMABOND ADVANCED .7 DNX6 (GAUZE/BANDAGES/DRESSINGS) ×2 IMPLANT
DEVICE CLOSURE PERCLS PRGLD 6F (VASCULAR PRODUCTS) ×8 IMPLANT
DRSG TEGADERM 4X4.75 (GAUZE/BANDAGES/DRESSINGS) ×3 IMPLANT
ELECT REM PT RETURN 9FT ADLT (ELECTROSURGICAL) ×3
ELECTRODE REM PT RTRN 9FT ADLT (ELECTROSURGICAL) ×2 IMPLANT
GAUZE SPONGE 4X4 12PLY STRL (GAUZE/BANDAGES/DRESSINGS) ×3 IMPLANT
GAUZE SPONGE 4X4 12PLY STRL LF (GAUZE/BANDAGES/DRESSINGS) ×3 IMPLANT
GLOVE BIO SURGEON STRL SZ7.5 (GLOVE) ×3 IMPLANT
GLOVE BIO SURGEON STRL SZ8 (GLOVE) ×6 IMPLANT
GLOVE EUDERMIC 7 POWDERFREE (GLOVE) IMPLANT
GLOVE ORTHO TXT STRL SZ7.5 (GLOVE) IMPLANT
GOWN STRL REUS W/ TWL LRG LVL3 (GOWN DISPOSABLE) ×8 IMPLANT
GOWN STRL REUS W/ TWL XL LVL3 (GOWN DISPOSABLE) ×4 IMPLANT
GOWN STRL REUS W/TWL LRG LVL3 (GOWN DISPOSABLE) ×12
GOWN STRL REUS W/TWL XL LVL3 (GOWN DISPOSABLE) ×6
GUIDEWIRE SAF TJ AMPL .035X180 (WIRE) ×3 IMPLANT
GUIDEWIRE SAFE TJ AMPLATZ EXST (WIRE) ×3 IMPLANT
KIT BASIN OR (CUSTOM PROCEDURE TRAY) ×3 IMPLANT
KIT HEART LEFT (KITS) ×3 IMPLANT
KIT TURNOVER KIT B (KITS) ×3 IMPLANT
NS IRRIG 1000ML POUR BTL (IV SOLUTION) ×3 IMPLANT
PACK ENDO MINOR (CUSTOM PROCEDURE TRAY) ×3 IMPLANT
PAD ARMBOARD 7.5X6 YLW CONV (MISCELLANEOUS) ×6 IMPLANT
PAD ELECT DEFIB RADIOL ZOLL (MISCELLANEOUS) ×3 IMPLANT
PERCLOSE PROGLIDE 6F (VASCULAR PRODUCTS) ×12
SET MICROPUNCTURE 5F STIFF (MISCELLANEOUS) ×3 IMPLANT
SHEATH BRITE TIP 7FR 35CM (SHEATH) ×3 IMPLANT
SHEATH PINNACLE 6F 10CM (SHEATH) ×3 IMPLANT
SHEATH PINNACLE 8F 10CM (SHEATH) ×3 IMPLANT
SLEEVE REPOSITIONING LENGTH 30 (MISCELLANEOUS) ×3 IMPLANT
STOPCOCK MORSE 400PSI 3WAY (MISCELLANEOUS) ×6 IMPLANT
SUT SILK  1 MH (SUTURE) ×3
SUT SILK 1 MH (SUTURE) ×2 IMPLANT
SYR 50ML LL SCALE MARK (SYRINGE) ×3 IMPLANT
SYR MEDRAD MARK V 150ML (SYRINGE) ×3 IMPLANT
TOWEL GREEN STERILE (TOWEL DISPOSABLE) ×6 IMPLANT
TRANSDUCER W/STOPCOCK (MISCELLANEOUS) ×6 IMPLANT
VALVE 26 ULTRA SAPIEN KIT (Valve) ×3 IMPLANT
WIRE EMERALD 3MM-J .035X150CM (WIRE) ×6 IMPLANT
WIRE EMERALD 3MM-J .035X260CM (WIRE) ×3 IMPLANT

## 2020-09-08 NOTE — Op Note (Signed)
[facility]   MULTIDISCIPLINARY HEART VALVE TEAM   TAVR OPERATIVE NOTE   Date of Procedure:  09/08/2020  Preoperative Diagnosis: Severe Aortic Stenosis   Postoperative Diagnosis: Same   Procedure:    Transcatheter Aortic Valve Replacement - Percutaneous Left Transfemoral Approach  Edwards Sapien 3 Ultra THV (size 26 mm, model # 9750TFX, serial # [id number])   Co-Surgeons:  Gaye Pollack, MD and Sherren Mocha, MD     Anesthesiologist:  Lissa Hoard MD  Echocardiographer:  Johnsie Cancel, MD  Pre-operative Echo Findings:  Severe aortic stenosis  Normal left ventricular systolic function  Post-operative Echo Findings:  No paravalvular leak  Normal left ventricular systolic function   BRIEF CLINICAL NOTE AND INDICATIONS FOR SURGERY  This 65 year old gentleman has stage D, severe, symptomatic aortic stenosis with New York Heart Association class II symptoms of exertional fatigue and shortness of breath consistent with chronic diastolic congestive heart failure.He developed acute diastolic congestive heart failure after 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 He had an elevated BNP and interstitial edema on chest x-ray at that time. I have personally reviewed his 2D echocardiogram, cardiac catheterization, and CTA studies. His echocardiogram shows a severely calcified aortic valve with markedly restricted mobility. The mean gradient across the valve is 43mHg consistent with critical aortic stenosis. Left ventricular ejection fraction was normal with moderate LVH and grade 2 diastolic dysfunction. There is mild mitral stenosis and regurgitation cardiac catheterization showed mild nonobstructive coronary disease with markedly elevated left and right heart pressures. I agree that aortic valve replacement is indicated in this patient for relief of his symptoms and to prevent progressive left ventricular deterioration. 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 He is 65years old but given his comorbid risk factors of obesity, poorly controlled diabetes, recent left BKA with immobility, renal transplant with stage III chronic kidney disease, nutritional deficit with an albumin of 2.5 and a prealbumin of 16.1, I think the best option for him would be transcatheter aortic valve replacement. The potential benefit of open surgical AVR does not outweigh the risk in this particular patient. I think he would be at significant risk for worsening renal failure, poor wound healing, and complications related to immobility with a recent left BKA including sternal wound problems. I discussed all of this with him and he is in agreement with transcatheter aortic valve placement. I discussed the increased risk of structural valve deterioration and lack of long-term durability studies with TAVR. His gated cardiac CTA shows anatomy suitable for transcatheter aortic valve replacement using a SAPIEN 3 valve. There is some calcification below the left coronary cusp extending into the annulus and LV outflow tract which could increase the risk of paravalvular leak. His abdominal and pelvic CTA shows adequate pelvic vascular anatomy to allow transfemoral insertion. It would probably be best to insert this through the left femoral artery since his renal transplant is anastomosed to the right external iliac artery.  The patientand his mother werecounseled at length regarding treatment alternatives for management of severe symptomatic aortic stenosis. The risks and benefits of surgical intervention has been discussed in detail. Long-term prognosis with medical therapy was discussed. Alternative approaches such as conventional surgical aortic valve replacement, transcatheter aortic valve replacement, and palliative medical therapy were compared and contrasted at length.  This discussion was placed in the context of the patient's own specific clinical presentation and past medical history.  All of their questions have been addressed.   Following the decision to proceed with transcatheter aortic valve replacement, a discussion was held regarding what types of management strategies would be attempted intraoperatively in the event of life-threatening complications, including whether or not the patient would be considered a candidate for the use of cardiopulmonary bypass and/or conversion to open sternotomy for attempted surgical intervention. The patient is aware of the fact that transient use of cardiopulmonary bypass may be necessary.He would be a candidate for emergent sternotomy if needed to manage any intraoperative complications. The patient has been advised of a variety of complications that might develop including but not limited to risks of death, stroke, paravalvular leak, aortic dissection or other major vascular complications, aortic annulus rupture, device embolization, cardiac rupture or perforation, mitral regurgitation, acute myocardial infarction, arrhythmia, heart block or bradycardia requiring permanent pacemaker placement, congestive heart failure, respiratory failure, renal failure, pneumonia, infection, other late complications related to structural valve deterioration or migration, or other complications that might ultimately cause a temporary or permanent loss of functional independence or other long term morbidity. The patient provides full informed consent for the procedure as described and all questions were answered.     DETAILS OF THE OPERATIVE PROCEDURE  PREPARATION:    The patient was brought to the operating room on the above mentioned date and appropriate monitoring was established by the anesthesia team. The patient was placed in the supine position on the operating table.  Intravenous antibiotics were administered. The patient was  monitored closely throughout the procedure under conscious sedation.    Baseline transthoracic echocardiogram was performed. The patient's abdomen and both groins were prepped and draped in a sterile manner. A time out procedure was performed.   PERIPHERAL ACCESS:    Using the modified Seldinger technique, femoral arterial and venous access was obtained with placement of 6 Fr sheaths on the right side.  A pigtail diagnostic catheter was passed through the right arterial sheath under fluoroscopic guidance into the aortic root.  A temporary transvenous pacemaker catheter was passed through the right femoral venous sheath under fluoroscopic guidance into the right ventricle.  The pacemaker was tested to ensure stable lead placement and pacemaker capture. Aortic root angiography was performed in order to determine the optimal angiographic angle for valve deployment.   TRANSFEMORAL ACCESS:   Percutaneous transfemoral access and sheath placement was performed using ultrasound guidance.  The left common femoral artery was cannulated using a micropuncture needle and appropriate location was verified using hand injection angiogram.  A pair of Abbott Perclose percutaneous closure devices were placed and a 6 French sheath replaced into the femoral artery.  The patient was heparinized systemically and ACT verified > 250 seconds.    A 14 Fr transfemoral E-sheath was introduced into the left common femoral artery after progressively dilating over an Amplatz superstiff wire. An AL-2 catheter was used to direct a straight-tip exchange length wire across the native aortic valve into the left ventricle. This was exchanged out for a pigtail catheter and position was confirmed in the LV apex. Simultaneous LV and Ao pressures were recorded. Mean gradient was  100 mm Hg. The pigtail catheter was exchanged for an Amplatz Extra-stiff wire in the LV apex.  Echocardiography was utilized to confirm appropriate wire position and  no sign of entanglement in the mitral subvalvular apparatus.   BALLOON AORTIC VALVULOPLASTY:   Balloon aortic valvuloplasty was performed using a 20 mm True balloon.  Once optimal position was achieved, BAV  was done under rapid ventricular pacing. The patient recovered well hemodynamically.    TRANSCATHETER HEART VALVE DEPLOYMENT:   An Edwards Sapien 3 Ultra transcatheter heart valve (size 26 mm) was prepared and crimped per manufacturer's guidelines, and the proper orientation of the valve is confirmed on the [employer] delivery system. The valve was advanced through the introducer sheath using normal technique until in an appropriate position in the abdominal aorta beyond the sheath tip. The balloon was then retracted and using the fine-tuning wheel was centered on the valve. The valve was then advanced across the aortic arch using appropriate flexion of the catheter. The valve was carefully positioned across the aortic valve annulus. The Commander catheter was retracted using normal technique. Once final position of the valve has been confirmed by angiographic assessment, the valve is deployed while temporarily holding ventilation and during rapid ventricular pacing to maintain systolic blood pressure < 50 mmHg and pulse pressure < 10 mmHg. The balloon inflation is held for >3 seconds after reaching full deployment volume. Once the balloon has fully deflated the balloon is retracted into the ascending aorta and valve function is assessed using echocardiography. There is felt to be no paravalvular leak and no central aortic insufficiency.  The patient's hemodynamic recovery following valve deployment is good.  The deployment balloon and guidewire are both removed.    PROCEDURE COMPLETION:   The sheath was removed and femoral artery closure performed.  Protamine was administered once femoral arterial repair was complete. The temporary pacemaker, pigtail catheters and femoral sheaths were  removed with manual pressure used for hemostasis.  A Mynx femoral closure device was utilized following removal of the diagnostic sheath in the right femoral artery.  The patient tolerated the procedure well and is transported to the cath lab recovery area in stable condition. There were no immediate intraoperative complications. All sponge instrument and needle counts are verified correct at completion of the operation.   No blood products were administered during the operation.  The patient received a total of 30 mL of diluted intravenous contrast during the procedure.   Gaye Pollack, MD 09/08/2020 9:52 AM

## 2020-09-08 NOTE — Progress Notes (Signed)
  Echocardiogram 2D Echocardiogram has been performed.  Michiel Cowboy 09/08/2020, 9:26 AM

## 2020-09-08 NOTE — Anesthesia Postprocedure Evaluation (Signed)
Anesthesia Post Note  Patient: Steven [name]  Procedure(s) Performed: TRANSCATHETER AORTIC VALVE REPLACEMENT, LEFT TRANSFEMORAL (Left Chest) TRANSESOPHAGEAL ECHOCARDIOGRAM (TEE) (N/A )     Patient location during evaluation: PACU Anesthesia Type: MAC Level of consciousness: awake and alert Pain management: pain level controlled Vital Signs Assessment: post-procedure vital signs reviewed and stable Respiratory status: spontaneous breathing Cardiovascular status: stable Anesthetic complications: no   No complications documented.  Last Vitals:  Vitals:   09/08/20 1330 09/08/20 1400  BP: (!) 119/57 (!) 123/55  Pulse: 77 76  Resp: 17 19  Temp:    SpO2: 94% 95%    Last Pain:  Vitals:   09/08/20 1140  TempSrc: Oral  PainSc:                  Nolon Nations

## 2020-09-08 NOTE — Progress Notes (Signed)
Pt arrived to rm 8 from PACU.  Initiated tele. CHG wipe and assessment performed. VSS. Will continue to monitor the pt.  Lavenia Atlas, RN

## 2020-09-08 NOTE — Anesthesia Procedure Notes (Signed)
Procedure Name: MAC Date/Time: 09/08/2020 7:33 AM Performed by: Moshe Salisbury, CRNA Pre-anesthesia Checklist: Patient identified, Emergency Drugs available, Suction available, Patient being monitored and Timeout performed Patient Re-evaluated:Patient Re-evaluated prior to induction Oxygen Delivery Method: Nasal cannula Placement Confirmation: positive ETCO2 Dental Injury: Teeth and Oropharynx as per pre-operative assessment

## 2020-09-08 NOTE — Anesthesia Procedure Notes (Signed)
Arterial Line Insertion Start/End2/03/2021 6:50 AM, 09/08/2020 7:00 AM Performed by: Moshe Salisbury, CRNA, CRNA  Patient location: Pre-op. Preanesthetic checklist: patient identified, IV checked, site marked, risks and benefits discussed, surgical consent, monitors and equipment checked, pre-op evaluation, timeout performed and anesthesia consent Lidocaine 1% used for infiltration Left, radial was placed Catheter size: 20 G Hand hygiene performed , maximum sterile barriers used  and Seldinger technique used  Attempts: 1 Procedure performed without using ultrasound guided technique. Following insertion, dressing applied and Biopatch. Post procedure assessment: normal  Patient tolerated the procedure well with no immediate complications.

## 2020-09-08 NOTE — Discharge Instructions (Signed)
ACTIVITY AND EXERCISE °• Daily activity and exercise are an important part of your recovery. People recover at different rates depending on their general health and type of valve procedure. °• Most people recovering from TAVR feel better relatively quickly  °• No lifting, pushing, pulling more than 10 pounds (examples to avoid: groceries, vacuuming, gardening, golfing): °            - For one week with a procedure through the groin. °            - For six weeks for procedures through the chest wall or neck. °NOTE: You will typically see one of our providers 7-14 days after your procedure to discuss WHEN TO RESUME the above activities.  °  °  °DRIVING °• Do not drive until you are seen for follow up and cleared by a provider. Generally, we ask patient to not drive for 1 week after their procedure. °• If you have been told by your doctor in the past that you may not drive, you must talk with him/her before you begin driving again. °  °DRESSING °• Groin site: you may leave the clear dressing over the site for up to one week or until it falls off. °  °HYGIENE °• If you had a femoral (leg) procedure, you may take a shower when you return home. After the shower, pat the site dry. Do NOT use powder, oils or lotions in your groin area until the site has completely healed. °• If you had a chest procedure, you may shower when you return home unless specifically instructed not to by your discharging practitioner. °            - DO NOT scrub incision; pat dry with a towel. °            - DO NOT apply any lotions, oils, powders to the incision. °            - No tub baths / swimming for at least 2 weeks. °• If you notice any fevers, chills, increased pain, swelling, bleeding or pus, please contact your doctor. °  °ADDITIONAL INFORMATION °• If you are going to have an upcoming dental procedure, please contact our office as you will require antibiotics ahead of time to prevent infection on your heart valve.  ° ° °If you have any  questions or concerns you can call the structural heart phone during normal business hours 8am-4pm. If you have an urgent need after hours or weekends please call [phone] to talk to the on call provider for general cardiology. If you have an emergency that requires immediate attention, please call 911.  ° ° °After TAVR Checklist ° °Check  Test Description  ° Follow up appointment in 1-2 weeks  You will see our structural heart physician assistant, Katie Satoria Dunlop. Your incision sites will be checked and you will be cleared to drive and resume all normal activities if you are doing well.    ° 1 month echo and follow up  You will have an echo to check on your new heart valve and be seen back in the office by Katie Koda Defrank. Many times the echo is not read by your appointment time, but Katie will call you later that day or the following day to report your results.  ° Follow up with your primary cardiologist You will need to be seen by your primary cardiologist in the following 3-6 months after your 1 month appointment in the valve   clinic. Often times your Plavix or Aspirin will be discontinued during this time, but this is decided on a case by case basis.   ° 1 year echo and follow up You will have another echo to check on your heart valve after 1 year and be seen back in the office by Katie Wilbur Labuda. This your last structural heart visit.  ° Bacterial endocarditis prophylaxis  You will have to take antibiotics for the rest of your life before all dental procedures (even teeth cleanings) to protect your heart valve. Antibiotics are also required before some surgeries. Please check with your cardiologist before scheduling any surgeries. Also, please make sure to tell us if you have a penicillin allergy as you will require an alternative antibiotic.   ° ° °

## 2020-09-08 NOTE — Op Note (Addendum)
[facility]   MULTIDISCIPLINARY HEART VALVE TEAM   TAVR OPERATIVE NOTE   Date of Procedure:  09/08/2020  Preoperative Diagnosis: Severe Aortic Stenosis   Postoperative Diagnosis: Same   Procedure:    Transcatheter Aortic Valve Replacement - Percutaneous  Transfemoral Approach  Edwards Sapien 3 Ultra THV (size 26 mm, model # 9750TFX, serial # [id number])   Co-Surgeons:  Gaye Pollack, MD and Sherren Mocha, MD  Anesthesiologist:  Nolon Nations MD  Echocardiographer:  Jenkins Rouge MD  Pre-operative Echo Findings:  Critical aortic stenosis  Normal  left ventricular systolic function  Post-operative Echo Findings:  No paravalvular leak  Normal left ventricular systolic function  BRIEF CLINICAL NOTE AND INDICATIONS FOR SURGERY  The patient has a complicated medical history with heavy NSAID use and uncontrolled diabetes leading to end-stage renal disease.  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 [name] ever since that time.  He has a longstanding heart murmur.  He was hospitalized in November 2021 with a nonhealing diabetic foot ulcer, failing outpatient antibiotics.  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  He then developed postoperative volume overload with acute diastolic heart failure requiring IV furosemide.  BNP was elevated and chest x-ray demonstrated interstitial edema consistent with congestive heart failure.  An echocardiogram was performed and the patient was found to have very severe aortic stenosis with a mean transvalvular gradient greater than 60 mmHg.  He was seen in formal consultation during his hospital stay on June 05, 2020.  At that time he had improved with IV diuretic therapy and was felt to be stable to continue with his postoperative rehab.  He did undergo right and left heart catheterization and echo studies while he was in the  hospital.  His echocardiogram is copied below and showed findings consistent with very severe aortic stenosis and preserved LV systolic function.  His cardiac catheterization demonstrated nonobstructive coronary artery disease with elevated right and left heart filling pressures.  The patient is here today for TAVR for treatment of severe stage D1 aortic stenosis.   During the course of the patient's preoperative work up they have been evaluated comprehensively by a multidisciplinary team of specialists coordinated through the [facility] in the [facility].  They have been demonstrated to suffer from symptomatic severe aortic stenosis as noted above. The patient has been counseled extensively as to the relative risks and benefits of all options for the treatment of severe aortic stenosis including long term medical therapy, conventional surgery for aortic valve replacement, and transcatheter aortic valve replacement.  The patient has been independently evaluated in formal cardiac surgical consultation by Dr Cyndia Bent, who deemed the patient appropriate for TAVR. Based upon review of all of the patient's preoperative diagnostic tests they are felt to be candidate for transcatheter aortic valve replacement using the transfemoral approach as an alternative to conventional surgery.    Following the decision to proceed with transcatheter aortic valve replacement, a discussion has been held regarding what types of management strategies would be attempted intraoperatively in the event of life-threatening complications, including whether or not the patient would be considered a candidate for the use of cardiopulmonary bypass and/or conversion to open sternotomy for attempted surgical intervention.  The patient has been advised of a variety of complications that might develop peculiar to this approach including but not limited to risks of death, stroke, paravalvular leak,  aortic dissection or other major vascular complications, aortic annulus rupture, device embolization, cardiac rupture or perforation, acute myocardial infarction, arrhythmia, heart block or bradycardia requiring permanent pacemaker placement, congestive heart failure, respiratory failure, renal failure, pneumonia, infection, other late complications related to structural valve deterioration or migration, or other complications that might ultimately cause a temporary or permanent loss of functional independence or other long term morbidity.  The patient provides full informed consent for the procedure as described and all questions were answered preoperatively.  DETAILS OF THE OPERATIVE PROCEDURE  PREPARATION:   The patient is brought to the operating room on the above mentioned date and central monitoring was established by the anesthesia team including placement of a central venous catheter and radial arterial line. The patient is placed in the supine position on the operating table.  Intravenous antibiotics are administered. The patient is monitored closely throughout the procedure under conscious sedation.  Baseline transthoracic echocardiogram is performed. The patient's chest, abdomen, both groins, and both lower extremities are prepared and draped in a sterile manner. A time out procedure is performed.   PERIPHERAL ACCESS:   Using ultrasound guidance, femoral arterial and venous access is obtained with placement of 6 Fr sheaths on the right side.  A pigtail diagnostic catheter was passed through the femoral arterial sheath under fluoroscopic guidance into the aortic root.  A temporary transvenous pacemaker catheter was passed through the femoral venous sheath under fluoroscopic guidance into the right ventricle.  The pacemaker was tested to ensure stable lead placement and pacemaker capture. Aortic root angiography was performed in order to determine the optimal angiographic angle for valve  deployment.  TRANSFEMORAL ACCESS:  A micropuncture technique is used to access the left femoral artery under fluoroscopic and ultrasound guidance.  2 Perclose devices are deployed at 10' and 2' positions to 'PreClose' the femoral artery. An 8 French sheath is placed and then an Amplatz Superstiff wire is advanced through the sheath. This is changed out for a 14 French transfemoral E-Sheath after progressively dilating over the Superstiff wire.  An AL-2 catheter was used to direct a straight-tip exchange length wire across the native aortic valve into the left ventricle. This was exchanged out for a pigtail catheter and position was confirmed in the LV apex. Simultaneous LV and Ao pressures were recorded.  The pigtail catheter was exchanged for an Amplatz Extra-stiff wire in the LV apex.    BALLOON AORTIC VALVULOPLASTY:  Performed under rapid pacing at 180 bpm using a 20 mm true balloon  TRANSCATHETER HEART VALVE DEPLOYMENT:  An Edwards Sapien 3 transcatheter heart valve (size 26 mm) was prepared and crimped per manufacturer's guidelines, and the proper orientation of the valve is confirmed on the [employer] delivery system. The valve was advanced through the introducer sheath using normal technique until in an appropriate position in the abdominal aorta beyond the sheath tip. The balloon was then retracted and using the fine-tuning wheel was centered on the valve. The valve was then advanced across the aortic arch using appropriate flexion of the catheter. The valve was carefully positioned across the aortic valve annulus. The Commander catheter was retracted using normal technique. Once final position of the valve has been confirmed by angiographic assessment, the valve is deployed while temporarily holding ventilation and during rapid ventricular pacing to maintain systolic blood pressure < 50 mmHg and pulse pressure < 10 mmHg. The balloon inflation is held for >3 seconds after reaching full  deployment volume. Once the balloon has fully deflated  the balloon is retracted into the ascending aorta and valve function is assessed using echocardiography. The patient's hemodynamic recovery following valve deployment is good.  The deployment balloon and guidewire are both removed. Echo demostrated acceptable post-procedural gradients, stable mitral valve function, and no aortic insufficiency.    PROCEDURE COMPLETION:  The sheath was removed and femoral artery closure is performed using the 2 previously deployed Perclose devices.  One of the Perclose devices failed and another Perclose device is used at the completion of the procedure to obtain complete hemostasis.  Protamine is administered once femoral arterial repair was complete. The site is clear with no evidence of bleeding or hematoma after the sutures are tightened. The temporary pacemaker and pigtail catheters are removed. Mynx closure  is used for contralateral femoral arterial hemostasis for the 6 Fr sheath.  The patient tolerated the procedure well and is transported to the surgical intensive care in stable condition. There were no immediate intraoperative complications. All sponge instrument and needle counts are verified correct at completion of the operation.   The patient received a total of 30 mL of intravenous contrast during the procedure.   Sherren Mocha, MD 09/08/2020 10:21 AM

## 2020-09-08 NOTE — Transfer of Care (Signed)
Immediate Anesthesia Transfer of Care Note  Patient: [name]  Procedure(s) Performed: TRANSCATHETER AORTIC VALVE REPLACEMENT, LEFT TRANSFEMORAL (Left Chest) TRANSESOPHAGEAL ECHOCARDIOGRAM (TEE) (N/A )  Patient Location: Cath Lab  Anesthesia Type:MAC  Level of Consciousness: drowsy and patient cooperative  Airway & Oxygen Therapy: Patient Spontanous Breathing and Patient connected to nasal cannula oxygen  Post-op Assessment: Report given to RN, Post -op Vital signs reviewed and stable and Patient moving all extremities  Post vital signs: Reviewed and stable  Last Vitals:  Vitals Value Taken Time  BP 154/61 09/08/20 1000  Temp    Pulse 88 09/08/20 1003  Resp 20 09/08/20 1003  SpO2 99 % 09/08/20 1003  Vitals shown include unvalidated device data.  Last Pain:  Vitals:   09/08/20 0557  PainSc: 0-No pain         Complications: No complications documented.

## 2020-09-08 NOTE — Interval H&P Note (Signed)
History and Physical Interval Note:  09/08/2020 6:46 AM  Steven [name]  has presented today for surgery, with the diagnosis of Severe Aortic Stenosis.  The various methods of treatment have been discussed with the patient and family. After consideration of risks, benefits and other options for treatment, the patient has consented to  Procedure(s): TRANSCATHETER AORTIC VALVE REPLACEMENT, LEFT TRANSFEMORAL (Left) TRANSESOPHAGEAL ECHOCARDIOGRAM (TEE) (N/A) as a surgical intervention.  The patient's history has been reviewed, patient examined, no change in status, stable for surgery.  I have reviewed the patient's chart and labs.  Questions were answered to the patient's satisfaction.     Gaye Pollack

## 2020-09-08 NOTE — OR Nursing (Signed)
Right DP pulse palpated preop. Right PT pulse located with doppler

## 2020-09-08 NOTE — Progress Notes (Signed)
  [employer] VALVE TEAM  Patient doing well s/p TAVR. Pt is drowsy. He is hemodynamically stable. Groin sites stable. ECG with new LBBB (previously ILBBB) and 1st deg AV block. There has been no high grade block. Arterial line discontinued and transferred  to 4E. Plan for early ambulation after bedrest completed and hopeful discharge over the next 24-48 hours.   Angelena Form PA-C  [facility]  Pager [phone]

## 2020-09-08 NOTE — Progress Notes (Signed)
Mobility Specialist: Progress Note   09/08/20 1658  Mobility  Activity Dangled on edge of bed  Level of Assistance Minimal assist, patient does 75% or more  Assistive Device None  Mobility Response Tolerated well  Mobility performed by Mobility specialist  $Mobility charge 1 Mobility   Pre-Mobility: 74 HR, 158/77 BP, 100% SpO2 Post-Mobility: 80 HR, 135/53 BP, 100% SpO2  Pt c/o R hip and knee pain, no rating given. Pt said pain is from when he was walking using RW at home. Pt had no c/o pain, burning sensation, or bleeding in groin, RN notified.   [facility] Djuana L[place]leton Mobility Specialist Mobility Specialist Phone: [phone]

## 2020-09-09 ENCOUNTER — Encounter (HOSPITAL_COMMUNITY): Payer: Self-pay | Admitting: Cardiovascular Disease

## 2020-09-09 ENCOUNTER — Telehealth: Payer: Self-pay | Admitting: Orthopedic Surgery

## 2020-09-09 ENCOUNTER — Ambulatory Visit: Payer: BC Managed Care – PPO | Admitting: Physician Assistant

## 2020-09-09 ENCOUNTER — Inpatient Hospital Stay (HOSPITAL_COMMUNITY): Payer: BC Managed Care – PPO

## 2020-09-09 DIAGNOSIS — I5033 Acute on chronic diastolic (congestive) heart failure: Secondary | ICD-10-CM | POA: Diagnosis not present

## 2020-09-09 DIAGNOSIS — Z952 Presence of prosthetic heart valve: Secondary | ICD-10-CM

## 2020-09-09 DIAGNOSIS — I350 Nonrheumatic aortic (valve) stenosis: Secondary | ICD-10-CM | POA: Diagnosis not present

## 2020-09-09 DIAGNOSIS — I130 Hypertensive heart and chronic kidney disease with heart failure and stage 1 through stage 4 chronic kidney disease, or unspecified chronic kidney disease: Secondary | ICD-10-CM | POA: Diagnosis not present

## 2020-09-09 DIAGNOSIS — Z006 Encounter for examination for normal comparison and control in clinical research program: Secondary | ICD-10-CM | POA: Diagnosis not present

## 2020-09-09 LAB — GLUCOSE, CAPILLARY
Glucose-Capillary: 147 mg/dL — ABNORMAL HIGH (ref 70–99)
Glucose-Capillary: 199 mg/dL — ABNORMAL HIGH (ref 70–99)
Glucose-Capillary: 154 mg/dL — ABNORMAL HIGH (ref 70–99)
Glucose-Capillary: 187 mg/dL — ABNORMAL HIGH (ref 70–99)

## 2020-09-09 LAB — BASIC METABOLIC PANEL
Anion gap: 11 (ref 5–15)
BUN: 37 mg/dL — ABNORMAL HIGH (ref 8–23)
CO2: 20 mmol/L — ABNORMAL LOW (ref 22–32)
Calcium: 8.1 mg/dL — ABNORMAL LOW (ref 8.9–10.3)
Chloride: 103 mmol/L (ref 98–111)
Creatinine, Ser: 2.1 mg/dL — ABNORMAL HIGH (ref 0.61–1.24)
GFR, Estimated: 35 mL/min — ABNORMAL LOW (ref >60)
Glucose, Bld: 163 mg/dL — ABNORMAL HIGH (ref 70–99)
Potassium: 3.6 mmol/L (ref 3.5–5.1)
Sodium: 134 mmol/L — ABNORMAL LOW (ref 135–145)

## 2020-09-09 LAB — ECHOCARDIOGRAM LIMITED
AR max vel: 3.3 cm2
AV Area VTI: 3.42 cm2
AV Area mean vel: 3.07 cm2
AV Mean grad: 11.5 mmHg
AV Peak grad: 18.8 mmHg
Ao pk vel: 2.17 m/s
Area-P 1/2: 2.91 cm2
Height: 71 in
MV VTI: 2.79 cm2
S' Lateral: 3.8 cm
Weight: 3583.8 oz

## 2020-09-09 LAB — CBC
HCT: 25.8 % — ABNORMAL LOW (ref 39.0–52.0)
Hemoglobin: 8.5 g/dL — ABNORMAL LOW (ref 13.0–17.0)
MCH: 26.2 pg (ref 26.0–34.0)
MCHC: 32.9 g/dL (ref 30.0–36.0)
MCV: 79.6 fL — ABNORMAL LOW (ref 80.0–100.0)
Platelets: 90 10*3/uL — ABNORMAL LOW (ref 150–400)
RBC: 3.24 MIL/uL — ABNORMAL LOW (ref 4.22–5.81)
RDW: 16 % — ABNORMAL HIGH (ref 11.5–15.5)
WBC: 3.9 10*3/uL — ABNORMAL LOW (ref 4.0–10.5)
nRBC: 0 % (ref 0.0–0.2)

## 2020-09-09 LAB — MAGNESIUM: Magnesium: 1.6 mg/dL — ABNORMAL LOW (ref 1.7–2.4)

## 2020-09-09 MED FILL — Potassium Chloride Inj 2 mEq/ML: INTRAVENOUS | Qty: 40 | Status: AC

## 2020-09-09 MED FILL — Magnesium Sulfate Inj 50%: INTRAMUSCULAR | Qty: 10 | Status: AC

## 2020-09-09 MED FILL — Heparin Sodium (Porcine) Inj 1000 Unit/ML: INTRAMUSCULAR | Qty: 30 | Status: AC

## 2020-09-09 NOTE — Progress Notes (Addendum)
[facility]VE TEAM  Patient Name: Steven [name] Date of Encounter: 09/09/2020  Primary Cardiologist: Dr. Tyrell Antonio Problem List     Principal Problem:   S/P TAVR (transcatheter aortic valve replacement) Active Problems:   Diabetes mellitus ([place])   Hyperlipidemia   Essential hypertension   Kidney transplant recipient   Obesity, Class III, BMI 40-49.9 (morbid obesity) ([place])   Diabetic polyneuropathy associated with type 2 diabetes mellitus (HCC)   Thrombocytopenia (HCC)   Normocytic anemia   Chronic pain   CKD (chronic kidney disease), stage III (HCC)   Severe aortic stenosis   Acquired absence of left leg below knee (HCC)    Subjective   Can tell a big difference in breathing. Eager to go home but understands it's the best thing to keep him one more day.   Inpatient Medications    Scheduled Meds: . aspirin EC  81 mg Oral Daily  . atorvastatin  10 mg Oral Daily  . clopidogrel  75 mg Oral Q breakfast  . docusate sodium  100 mg Oral BID  . fenofibrate  54 mg Oral Daily  . insulin aspart  0-15 Units Subcutaneous TID WC  . insulin aspart  4 Units Subcutaneous TID WC  . insulin detemir  10 Units Subcutaneous Daily  . mycophenolate  360 mg Oral BID  . pantoprazole  40 mg Oral Daily  . sodium chloride flush  3 mL Intravenous Q12H  . tacrolimus  1 mg Oral BID  . traZODone  50 mg Oral QHS   Continuous Infusions: . sodium chloride    . cefUROXime (ZINACEF)  IV 1.5 g (09/09/20 0829)  . nitroGLYCERIN    . phenylephrine (NEO-SYNEPHRINE) Adult infusion     PRN Meds: sodium chloride, acetaminophen, acetaminophen-codeine, cyclobenzaprine, metoprolol tartrate, morphine injection, ondansetron (ZOFRAN) IV, polyethylene glycol, sodium chloride flush, traMADol   Vital Signs    Vitals:   09/08/20 2330 09/09/20 0350 09/09/20 0355 09/09/20 0752  BP: (!) 143/61 (!) 160/56  (!) 148/92  Pulse: 84 87  89  Resp: 18 (!) 21  20   Temp: 98.6 F (37 C) 99.2 F (37.3 C)  98.9 F (37.2 C)  TempSrc: Oral Oral  Oral  SpO2: 98% 97%  98%  Weight:   101.6 kg   Height:        Intake/Output Summary (Last 24 hours) at 09/09/2020 0901 Last data filed at 09/09/2020 0300 Gross per 24 hour  Intake 2627.9 ml  Output 1050 ml  Net 1577.9 ml   Filed Weights   09/08/20 0557 09/09/20 0355  Weight: 106.6 kg 101.6 kg    Physical Exam   GEN: Well nourished, well developed, in no acute distress.  HEENT: Grossly normal.  Neck: Supple, no JVD, carotid bruits, or masses. Cardiac: RRR, soft flow murmur. No rubs, or gallops. No clubbing, cyanosis, edema. S/p left BKA  Respiratory:  Respirations regular and unlabored, clear to auscultation bilaterally. GI: Soft, nontender, nondistended, BS + x 4. MS: no deformity or atrophy. Skin: warm and dry, no rash. Neuro:  Strength and sensation are intact. Psych: AAOx3.  Normal affect.  Labs    CBC Recent Labs    09/08/20 1022 09/09/20 0025  WBC  --  3.9*  HGB 9.5* 8.5*  HCT 28.0* 25.8*  MCV  --  79.6*  PLT  --  90*   Basic Metabolic Panel Recent Labs    09/08/20 1022 09/09/20 0025  NA 136  134*  K 3.8 3.6  CL 102 103  CO2  --  20*  GLUCOSE 243* 163*  BUN 33* 37*  CREATININE 1.60* 2.10*  CALCIUM  --  8.1*  MG  --  1.6*   Liver Function Tests No results for input(s): AST, ALT, ALKPHOS, BILITOT, PROT, ALBUMIN in the last 72 hours. No results for input(s): LIPASE, AMYLASE in the last 72 hours. Cardiac Enzymes No results for input(s): CKTOTAL, CKMB, CKMBINDEX, TROPONINI in the last 72 hours. BNP Invalid input(s): POCBNP D-Dimer No results for input(s): DDIMER in the last 72 hours. Hemoglobin A1C No results for input(s): HGBA1C in the last 72 hours. Fasting Lipid Panel No results for input(s): CHOL, HDL, LDLCALC, TRIG, CHOLHDL, LDLDIRECT in the last 72 hours. Thyroid Function Tests No results for input(s): TSH, T4TOTAL, T3FREE, THYROIDAB in the last 72  hours.  Invalid input(s): FREET3  Telemetry    Sinus  - Personally Reviewed  ECG    Sinus with new LBBB.  - Personally Reviewed  Radiology    DG Chest Port 1 View  Result Date: 09/08/2020 CLINICAL DATA:  Status post TAVR EXAM: PORTABLE CHEST 1 VIEW COMPARISON:  09/04/2020 FINDINGS: Cardiac shadow is stable. Findings of recent TAVR are seen. Lungs are well aerated with minimal right basilar atelectasis. No focal infiltrate or sizable effusion is seen. No significant vascular congestion is noted. IMPRESSION: Changes of recent TAVR. Mild right basilar atelectasis. Electronically Signed   By: Inez Catalina M.D.   On: 09/08/2020 12:51   ECHOCARDIOGRAM LIMITED  Result Date: 09/08/2020    ECHOCARDIOGRAM LIMITED REPORT   Patient Name:   Steven [name] Date of Exam: 09/08/2020 Medical Rec #:  [id number]       Height:       71.0 in Accession #:    [id number]      Weight:       235.0 lb Date of Birth:  08/01/1956       BSA:          2.258 m Patient Age:    65 years        BP:           147/77 mmHg Patient Gender: M               HR:           87 bpm. Exam Location:  Inpatient Procedure: Limited Echo Indications:    Aortic stenosis I35.0  History:        Patient has prior history of Echocardiogram examinations, most                 recent 06/04/2020. Signs/Symptoms:Dyspnea; Risk Factors:Sleep                 Apnea, Hypertension, Diabetes, Dyslipidemia and Non-Smoker.                 GERD.                 Aortic Valve: 26 mm Sapien prosthetic, stented (TAVR) valve is                 present in the aortic position. Procedure Date: 09/08/2020.  Sonographer:    Vickie Epley RDCS Referring Phys: [id number] [place]  1. Left ventricular ejection fraction, by estimation, is 60 to 65%. The left ventricle has normal function. The left ventricle has no regional wall motion abnormalities. There is mild left ventricular hypertrophy.  2. Right ventricular systolic function  is normal. The right ventricular size is  normal.  3. Left atrial size was moderately dilated.  4. The mitral valve is degenerative. Trivial mitral valve regurgitation. Mild mitral stenosis. Severe mitral annular calcification.  5. Pre TAVR: difficult images supine in OR. Severely calcified tri leaflet restricted motion Mean gradient 35 peak 56 mmHg AVA 1.1 cm2 Gradients underestimated and valve area over estimated         Post TAVR: 26 mm Sapien 3 valve Somewhat deeply deployed with stent struts seen near basal septum No significant PvL Mean gradient 4 peak 8 mmHg AVA 3.5 cm2. The aortic valve has been repaired/replaced. Aortic valve regurgitation is not visualized. Severe aortic valve stenosis. There is a 26 mm Sapien prosthetic (TAVR) valve present in the aortic position. Procedure Date: 09/08/2020.  6. The inferior vena cava is normal in size with greater than 50% respiratory variability, suggesting right atrial pressure of 3 mmHg. FINDINGS  Left Ventricle: Left ventricular ejection fraction, by estimation, is 60 to 65%. The left ventricle has normal function. The left ventricle has no regional wall motion abnormalities. The left ventricular internal cavity size was normal in size. There is  mild left ventricular hypertrophy. Right Ventricle: The right ventricular size is normal. No increase in right ventricular wall thickness. Right ventricular systolic function is normal. Left Atrium: Left atrial size was moderately dilated. Right Atrium: Right atrial size was not assessed. Pericardium: There is no evidence of pericardial effusion. Mitral Valve: The mitral valve is degenerative in appearance. There is severe thickening of the mitral valve leaflet(s). There is severe calcification of the mitral valve leaflet(s). Mildly decreased mobility of the mitral valve leaflets. Severe mitral annular calcification. Trivial mitral valve regurgitation. Mild mitral valve stenosis. Tricuspid Valve: The tricuspid valve is not assessed. Tricuspid valve regurgitation is  not demonstrated. No evidence of tricuspid stenosis. Aortic Valve: Pre TAVR: difficult images supine in OR. Severely calcified tri leaflet restricted motion Mean gradient 35 peak 56 mmHg AVA 1.1 cm2 Gradients underestimated and valve area over estimated Post TAVR: 26 mm Sapien 3 valve Somewhat deeply deployed with stent struts seen near basal septum No significant PvL Mean gradient 4 peak 8 mmHg AVA 3.5 cm2. The aortic valve has been repaired/replaced. Aortic valve regurgitation is not visualized. Severe aortic stenosis is present. Aortic valve mean gradient measures 5.0 mmHg. Aortic valve peak gradient measures 7.5 mmHg. Aortic valve area, by VTI measures 3.50 cm. There is a 26 mm Sapien prosthetic, stented (TAVR) valve present in the aortic position. Procedure Date: 09/08/2020. Pulmonic Valve: The pulmonic valve was not assessed. Pulmonic valve regurgitation is not visualized. No evidence of pulmonic stenosis. Aorta: The aortic root is normal in size and structure and aortic root could not be assessed. Venous: The inferior vena cava is normal in size with greater than 50% respiratory variability, suggesting right atrial pressure of 3 mmHg. IAS/Shunts: The interatrial septum was not assessed. LEFT VENTRICLE PLAX 2D LVOT diam:     2.40 cm LV SV:         100 LV SV Index:   44 LVOT Area:     4.52 cm  AORTIC VALVE AV Area (Vmax):    3.83 cm AV Area (Vmean):   3.71 cm AV Area (VTI):     3.50 cm AV Vmax:           137.00 cm/s AV Vmean:          103.000 cm/s AV VTI:  0.287 m AV Peak Grad:      7.5 mmHg AV Mean Grad:      5.0 mmHg LVOT Vmax:         116.00 cm/s LVOT Vmean:        84.400 cm/s LVOT VTI:          0.222 m LVOT/AV VTI ratio: 0.77  SHUNTS Systemic VTI:  0.22 m Systemic Diam: 2.40 cm Jenkins Rouge MD Electronically signed by Jenkins Rouge MD Signature Date/Time: 09/08/2020/10:05:01 AM    Final    Structural Heart Procedure  Result Date: 09/08/2020 See surgical note for result.   Cardiac Studies    TAVR OPERATIVE NOTE   Date of Procedure:                09/08/2020  Preoperative Diagnosis:      Severe Aortic Stenosis   Postoperative Diagnosis:    Same   Procedure:        Transcatheter Aortic Valve Replacement - Percutaneous  Transfemoral Approach             Edwards Sapien 3 Ultra THV (size 26 mm, model # 9750TFX, serial # [id number])              Co-Surgeons:                        Gaye Pollack, MD and Sherren Mocha, MD  Anesthesiologist:                  Nolon Nations MD  Echocardiographer:              Jenkins Rouge MD  Pre-operative Echo Findings: ? Critical aortic stenosis ? Normal  left ventricular systolic function  Post-operative Echo Findings: ? No paravalvular leak ? Normal left ventricular systolic function  _____________________   Echo 09/09/20: pending   Patient Profile     Steven [name] is a 65 y.o. male with a history of HTN, HLD, IDDM, morbid obesity, OSA,anemia of chronic disease, ESRDs/p renal transplantation(2006)with CKD stage III-IV, PAD, chronic diastolic CHF, osteomyelitis s/p L BKA and severe AS who presented to [facility]20 who planned TAVR.  Assessment & Plan   Severe AS: s/p successful TAVR with a 26 mm Edwards Sapien 3 Ultra THV via the TF approach on 09/08/20. Post operative echo pending. Groin sites are stable. ECG with sinus and new LBBB (previously ILBBB). There has been evidence of high grade heart block by ECG or tele. Continued on home aspirin and started on Plavix '75mg'$  daily. Plan to keep one more day given progressive conduction disease and AKI.   AKI superimposed on chronic CKD stage III after renal transplantation: creat 1.5--> 2.10. Baseline creat 1.5-1.7. Will monitor overnight. Lasix held  New LBBB: pt had an ILBBB prior to TAVR. Immediate post op ECG showed new complete LBBB and 1st deg AV block. PR has now normalized. Will keep another day to monitor for rhythm disturbance. Will discharge home with a Zio  patch.   DMT2: continue SSI  HTN: BP elevated today. Holding Lasix due to AKI. Will continue to monitor, but may need an additional antihypertensive added.   Abnormal chest CT: pre TAVR CTs showed mosaic attenuation in the lung parenchyma suggesting of areas of air trapping from small airways disease. Further evaluation with noncontrast high-resolution chest CT is recommended in 3 months to better evaluate these findings. This will be discussed as an outpatient.   Signed, Angelena Form,  PA-C  09/09/2020, 9:01 AM  Pager [phone]  Patient seen, examined. Available data reviewed. Agree with findings, assessment, and plan as outlined by Nell Range, PA-C.  The patient is independently interviewed and examined.  He is alert, oriented, in no distress.  JVP is normal, lung fields are clear, heart is regular rate and rhythm with a 2/6 early peaking systolic ejection murmur at the right upper sternal border, bilateral groin sites are clear, there is no peripheral edema.  I agree with findings as outlined above.  The patient has a new left bundle branch block and first-degree AV block.  PR interval has now improved but is left bundle branch block with QRS 150 ms remains.  Recommend another 24 hours of observation of his heart rhythm.  Would anticipate discharge tomorrow as long as he has no significant bradycardic events.  Also need to follow his renal function as his creatinine is increased from 1.6-2.1 with his background of renal transplantation and chronic kidney disease.  The patient otherwise has had dramatic improvement in his breathing since undergoing TAVR.  He is feeling well today.  Sherren Mocha, M.D. 09/09/2020 10:58 AM

## 2020-09-09 NOTE — Progress Notes (Signed)
Mobility Specialist: Progress Note   09/09/20 1244  Mobility  Activity Stood at bedside  Level of Assistance Maximum assist, patient does 25-49%  Assistive Device Front wheel walker  Mobility Response Tolerated well  Mobility performed by Mobility specialist  $Mobility charge 1 Mobility   Pre-Mobility: 81 HR Post-Mobility: 86 HR  Pt stood at EOB for 1 minute. Pt was independent to EOB from supine and maxA to stand from EOB. Recommend +2 for safety. Pt back to bed after session.   [facility] Jayleana Colberg Mobility Specialist Mobility Specialist Phone: [phone]

## 2020-09-09 NOTE — Telephone Encounter (Signed)
Pt wife wendi called and needs to speak with you because her husband is at the hospital and will be discharged tomorrow and he is having pain in the right leg and he can't even stand on it. He only has that right leg because the left is amputated. His 63 year old parents are his care givers and they're scared they're not going to be able to take care of him.

## 2020-09-09 NOTE — Progress Notes (Signed)
1100-1130 Gave pt diabetic and heart healthy diets. Discussed low carb food choices. Also encouraged pt to watch sodium. Pt is awaiting his prosthesis. He stated he turned right knee at home and it is painful when he bends knee a certain way. Did not mention  CRP 2 as not appropriate a this time. Mobility specialist will see pt later. Pt stated he might try to stand at bedside then. Graylon Good RN BSN 09/09/2020 11:33 AM

## 2020-09-09 NOTE — Progress Notes (Signed)
  Echocardiogram 2D Echocardiogram limited post TAVR has been performed.  Steven [name] M 09/09/2020, 8:53 AM

## 2020-09-10 ENCOUNTER — Ambulatory Visit (INDEPENDENT_AMBULATORY_CARE_PROVIDER_SITE_OTHER): Payer: BC Managed Care – PPO

## 2020-09-10 ENCOUNTER — Other Ambulatory Visit: Payer: Self-pay | Admitting: Physician Assistant

## 2020-09-10 DIAGNOSIS — I447 Left bundle-branch block, unspecified: Secondary | ICD-10-CM | POA: Diagnosis not present

## 2020-09-10 DIAGNOSIS — Z006 Encounter for examination for normal comparison and control in clinical research program: Secondary | ICD-10-CM | POA: Diagnosis not present

## 2020-09-10 DIAGNOSIS — Z952 Presence of prosthetic heart valve: Secondary | ICD-10-CM | POA: Diagnosis not present

## 2020-09-10 DIAGNOSIS — I130 Hypertensive heart and chronic kidney disease with heart failure and stage 1 through stage 4 chronic kidney disease, or unspecified chronic kidney disease: Secondary | ICD-10-CM | POA: Diagnosis not present

## 2020-09-10 DIAGNOSIS — I350 Nonrheumatic aortic (valve) stenosis: Secondary | ICD-10-CM | POA: Diagnosis not present

## 2020-09-10 DIAGNOSIS — I5033 Acute on chronic diastolic (congestive) heart failure: Secondary | ICD-10-CM | POA: Diagnosis not present

## 2020-09-10 LAB — BASIC METABOLIC PANEL
Anion gap: 13 (ref 5–15)
BUN: 39 mg/dL — ABNORMAL HIGH (ref 8–23)
CO2: 20 mmol/L — ABNORMAL LOW (ref 22–32)
Calcium: 8.5 mg/dL — ABNORMAL LOW (ref 8.9–10.3)
Chloride: 104 mmol/L (ref 98–111)
Creatinine, Ser: 2.01 mg/dL — ABNORMAL HIGH (ref 0.61–1.24)
GFR, Estimated: 37 mL/min — ABNORMAL LOW (ref >60)
Glucose, Bld: 169 mg/dL — ABNORMAL HIGH (ref 70–99)
Potassium: 3.9 mmol/L (ref 3.5–5.1)
Sodium: 137 mmol/L (ref 135–145)

## 2020-09-10 LAB — GLUCOSE, CAPILLARY: Glucose-Capillary: 152 mg/dL — ABNORMAL HIGH (ref 70–99)

## 2020-09-10 MED ORDER — FUROSEMIDE 40 MG PO TABS: 40.0000 mg | ORAL_TABLET | ORAL | Status: DC | PRN

## 2020-09-10 MED ORDER — CLOPIDOGREL BISULFATE 75 MG PO TABS: 75.0000 mg | ORAL_TABLET | Freq: Every day | ORAL | 1 refills | Status: DC

## 2020-09-10 MED ORDER — AMLODIPINE BESYLATE 5 MG PO TABS: 5.0000 mg | ORAL_TABLET | Freq: Every day | ORAL | 0 refills | Status: DC

## 2020-09-10 MED ORDER — HYDRALAZINE HCL 25 MG PO TABS: 25.0000 mg | ORAL_TABLET | Freq: Three times a day (TID) | ORAL | 0 refills | Status: DC

## 2020-09-10 MED ORDER — PANTOPRAZOLE SODIUM 40 MG PO TBEC: 40.0000 mg | DELAYED_RELEASE_TABLET | Freq: Every day | ORAL | 1 refills | Status: DC

## 2020-09-10 MED FILL — CLOPIDOGREL 75 MG TABLET: 75 | 90 days supply | Qty: 90 | Fill #0

## 2020-09-10 MED FILL — hydrALAZINE HCL 25 MG TABS: 25 | 30 days supply | Qty: 120 | Fill #0

## 2020-09-10 MED FILL — PANTOPRAZOLE SOD DR 40 MG T: 40 | 90 days supply | Qty: 90 | Fill #0

## 2020-09-10 NOTE — Progress Notes (Signed)
Patient was given discharge instructions and stated understanding.  All his questions were answered.

## 2020-09-10 NOTE — Progress Notes (Signed)
Zio patch placed onto patient.  All instructions and information reviewed with patient, they verbalize understanding with no questions. 

## 2020-09-10 NOTE — Plan of Care (Signed)
  Problem: Education: Goal: Knowledge of General Education information will improve Description: Including pain rating scale, medication(s)/side effects and non-pharmacologic comfort measures Outcome: Adequate for Discharge   Problem: Health Behavior/Discharge Planning: Goal: Ability to manage health-related needs will improve Outcome: Adequate for Discharge   Problem: Clinical Measurements: Goal: Ability to maintain clinical measurements within normal limits will improve Outcome: Adequate for Discharge Goal: Will remain free from infection Outcome: Adequate for Discharge Goal: Diagnostic test results will improve Outcome: Adequate for Discharge Goal: Respiratory complications will improve Outcome: Adequate for Discharge Goal: Cardiovascular complication will be avoided Outcome: Adequate for Discharge   Problem: Activity: Goal: Risk for activity intolerance will decrease Outcome: Adequate for Discharge   Problem: Coping: Goal: Level of anxiety will decrease Outcome: Adequate for Discharge   Problem: Elimination: Goal: Will not experience complications related to bowel motility Outcome: Adequate for Discharge Goal: Will not experience complications related to urinary retention Outcome: Adequate for Discharge   Problem: Pain Managment: Goal: General experience of comfort will improve Outcome: Adequate for Discharge   Problem: Safety: Goal: Ability to remain free from injury will improve Outcome: Adequate for Discharge   Problem: Skin Integrity: Goal: Risk for impaired skin integrity will decrease Outcome: Adequate for Discharge   

## 2020-09-10 NOTE — Discharge Summary (Addendum)
[place] VALVE TEAM  Discharge Summary    Patient ID: Steven [name] MRN: [id number]; DOB: 1955-09-24  Admit date: 09/08/2020 Discharge date: 09/10/2020  Primary Care Provider: Isaac Bliss, Rayford Halsted, MD  Primary Cardiologist: Sherren Mocha, MD   Discharge Diagnoses    Principal Problem:   S/P TAVR (transcatheter aortic valve replacement) Active Problems:   Diabetes mellitus ([place])   Hyperlipidemia   Essential hypertension   Kidney transplant recipient   Obesity, Class III, BMI 40-49.9 (morbid obesity) ([place])   Diabetic polyneuropathy associated with type 2 diabetes mellitus (HCC)   Thrombocytopenia (HCC)   Normocytic anemia   Chronic pain   CKD (chronic kidney disease), stage III (HCC)   Severe aortic stenosis   Acquired absence of left leg below knee (HCC)   Allergies Allergies  Allergen Reactions   Sulfa Antibiotics Rash    Diagnostic Studies/Procedures    TAVR OPERATIVE NOTE     Date of Procedure:                09/08/2020   Preoperative Diagnosis:      Severe Aortic Stenosis    Postoperative Diagnosis:    Same    Procedure:        Transcatheter Aortic Valve Replacement - Percutaneous  Transfemoral Approach             Edwards Sapien 3 Ultra THV (size 26 mm, model # 9750TFX, serial # [id number])              Co-Surgeons:                        Gaye Pollack, MD and Sherren Mocha, MD   Anesthesiologist:                  Nolon Nations MD   Echocardiographer:              Jenkins Rouge MD   Pre-operative Echo Findings: Critical aortic stenosis Normal  left ventricular systolic function   Post-operative Echo Findings: No paravalvular leak Normal left ventricular systolic function   _____________________     Echo 09/09/20: IMPRESSIONS   1. Left ventricular ejection fraction, by estimation, is 60 to 65%. The  left ventricle has normal function. There is mild left ventricular  hypertrophy. Elevated left  ventricular end-diastolic pressure.   2. Left atrial size was moderately dilated.   3. Mean gradient across MV 8 mmHg but MAV by PT1/2 2.8 cm2. The mitral  valve is abnormal. Trivial mitral valve regurgitation. Mild mitral  stenosis. Severe mitral annular calcification.   4. Post TAVR with 26 mm Sapien 3 valve trivial PVL mean gradient 11 peak  18 mmHg DVI 0.64 AVA 3 cm2 . The aortic valve has been repaired/replaced.  There is a 26 mm Sapien prosthetic (TAVR) valve present in the aortic  position. Procedure Date:  09/08/2020.    History of Present Illness     Steven [name] is a 65 y.o. male with a history of HTN, HLD, IDDM, morbid obesity, OSA, anemia of chronic disease, ESRD s/p renal transplantation (2006) with CKD stage III-IV, PAD, chronic diastolic CHF, osteomyelitis s/p L BKA and severe AS who presented to [facility]20 who planned TAVR.  The pt has poorly controlled diabetes and history of heavy NSAID use leading to end-stage renal disease.  He underwent renal transplantation in 2006 and has been followed by Dr. [name].  His  creatinine has been around 2 for several years.  He was hospitalized in November 2021 with a nonhealing left diabetic foot ulcer that failed outpatient antibiotics.  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 Sharol Given.  He developed postoperative volume overload with acute diastolic heart failure requiring intravenous diuresis.  His BNP was elevated and chest x-ray showed interstitial pulmonary edema consistent with congestive heart failure.  He had a longstanding heart murmur but does not remember having been told that he had aortic stenosis.  A 2D echocardiogram showed a heavily calcified aortic valve with a mean gradient of 67 mmHg. There is moderate concentric LVH with grade 2 diastolic dysfunction.  Ejection fraction was 60 to 65%.  There was thickening and calcification of the mitral valve leaflets with moderate mitral  annular calcification.  There is mild mitral valve stenosis with a mean gradient of 4 mmHg and a peak gradient of 13.7 mmHg.  There is mild MR.  Cardiac catheterization showed mild nonobstructive coronary disease with moderate to severely elevated left and right heart pressures.  Since discharge home he has been feeling fairly well and his left BKA stump has been healing without signs of infection.  He recently saw Dr. Sharol Given and has been scheduled to see a prosthetic specialist.  The patient has been evaluated by the multidisciplinary valve team and felt to have severe, symptomatic aortic stenosis and to be a suitable candidate for TAVR, which was set up for 09/08/20.   Hospital Course     Consultants: none  Severe AS: s/p successful TAVR with a 26 mm Edwards Sapien 3 Ultra THV via the TF approach on 09/08/20. Post operative echo showed EF 60%, normally functioning TAVR with a mean gradient of 11 mm Hg and no PVL. Groin sites are stable. ECG with sinus and new LBBB (previously ILBBB). There has been no evidence of high grade heart block by ECG or tele. Continued on home aspirin and started on Plavix '75mg'$  daily. Plan for DC home today with close follow up in the office next week.    AKI superimposed on chronic CKD stage III after renal transplantation: creat 1.5--> 2.10. Baseline creat 1.5-1.7. Lasix held and creat down to 2.01. Will recheck BMET at next week follow up.    New LBBB: pt had an ILBBB prior to TAVR.ECG today shows 1st deg AV block and new LBBB. Monitored on tele for 48 hours with no evidence of HAVB. Will discharge home with a Zio patch.    DMT2: continue SSI. Resume home meds  GERD: Prilosec was changed to Protonix given potential drug drug interaction with Plavix  HTN: BP has been elevated. Given worsening of renal function, I am changing daily lasix to PRN. Will start hydralazine '25mg'$  TID (Norvasc interacted with his tacrolimus). He will keep a long for me which I will review next week.     Abnormal chest CT: pre TAVR CTs showed mosaic attenuation in the lung parenchyma suggesting of areas of air trapping from small airways disease. Further evaluation with noncontrast high-resolution chest CT is recommended in 3 months to better evaluate these findings. This will be discussed as an outpatient.  _____________  Discharge Vitals Blood pressure (!) 159/76, pulse 98, temperature 97.9 F (36.6 C), temperature source Oral, resp. rate 16, height '5\' 11"'$  (1.803 m), weight 102.9 kg, SpO2 97 %.  Filed Weights   09/08/20 0557 09/09/20 0355 09/10/20 0433  Weight: 106.6 kg 101.6 kg 102.9 kg  GEN: Well nourished, well developed, in no acute distress.  HEENT: Grossly normal.  Neck: Supple, no JVD, carotid bruits, or masses. Cardiac: RRR, soft flow murmur. No rubs, or gallops. No clubbing, cyanosis, edema. S/p left BKA  Respiratory:  Respirations regular and unlabored, clear to auscultation bilaterally. GI: Soft, nontender, nondistended, BS + x 4. MS: no deformity or atrophy. Skin: warm and dry, no rash. Neuro:  Strength and sensation are intact. Psych: AAOx3.  Normal affect.   Labs & Radiologic Studies    CBC Recent Labs    09/08/20 1022 09/09/20 0025  WBC  --  3.9*  HGB 9.5* 8.5*  HCT 28.0* 25.8*  MCV  --  79.6*  PLT  --  90*   Basic Metabolic Panel Recent Labs    09/09/20 0025 09/10/20 0836  NA 134* 137  K 3.6 3.9  CL 103 104  CO2 20* 20*  GLUCOSE 163* 169*  BUN 37* 39*  CREATININE 2.10* 2.01*  CALCIUM 8.1* 8.5*  MG 1.6*  --    Liver Function Tests No results for input(s): AST, ALT, ALKPHOS, BILITOT, PROT, ALBUMIN in the last 72 hours. No results for input(s): LIPASE, AMYLASE in the last 72 hours. Cardiac Enzymes No results for input(s): CKTOTAL, CKMB, CKMBINDEX, TROPONINI in the last 72 hours. BNP Invalid input(s): POCBNP D-Dimer No results for input(s): DDIMER in the last 72 hours. Hemoglobin A1C No results for input(s): HGBA1C in the last 72  hours. Fasting Lipid Panel No results for input(s): CHOL, HDL, LDLCALC, TRIG, CHOLHDL, LDLDIRECT in the last 72 hours. Thyroid Function Tests No results for input(s): TSH, T4TOTAL, T3FREE, THYROIDAB in the last 72 hours.  Invalid input(s): FREET3 _____________  DG Chest 2 View  Result Date: 09/05/2020 CLINICAL DATA:  Preop EXAM: CHEST - 2 VIEW COMPARISON:  June 04, 2020 FINDINGS: The cardiomediastinal silhouette is unchanged in contour.Resolution of previously described interstitial opacities. Trace RIGHT pleural effusion. No pneumothorax. No acute pleuroparenchymal abnormality. Visualized abdomen is unremarkable. Multilevel degenerative changes of the thoracic spine. IMPRESSION: Resolution of previously described pulmonary edema. Trace RIGHT pleural effusion. Electronically Signed   By: Valentino Saxon MD   On: 09/05/2020 15:09   DG Chest Port 1 View  Result Date: 09/08/2020 CLINICAL DATA:  Status post TAVR EXAM: PORTABLE CHEST 1 VIEW COMPARISON:  09/04/2020 FINDINGS: Cardiac shadow is stable. Findings of recent TAVR are seen. Lungs are well aerated with minimal right basilar atelectasis. No focal infiltrate or sizable effusion is seen. No significant vascular congestion is noted. IMPRESSION: Changes of recent TAVR. Mild right basilar atelectasis. Electronically Signed   By: Inez Catalina M.D.   On: 09/08/2020 12:51   ECHOCARDIOGRAM LIMITED  Result Date: 09/09/2020    ECHOCARDIOGRAM LIMITED REPORT   Patient Name:   Steven [name] Date of Exam: 09/09/2020 Medical Rec #:  [id number]       Height:       71.0 in Accession #:    [id number]      Weight:       224.0 lb Date of Birth:  10-07-1955       BSA:          2.213 m Patient Age:    14 years        BP:           148/92 mmHg Patient Gender: M               HR:           89  bpm. Exam Location:  Inpatient Procedure: Limited Echo, Limited Color Doppler and Color Doppler Indications:    Post TAVR evaluation V43.3 / Z95.2  History:        Patient has  prior history of Echocardiogram examinations, most                 recent 09/08/2020. Risk Factors:Diabetes, Hypertension and                 Dyslipidemia. GERD. History of end stage renal disease, kidney                 transplant in 2006.                 Aortic Valve: 26 mm Sapien prosthetic, stented (TAVR) valve is                 present in the aortic position. Procedure Date: 09/08/2020.  Sonographer:    Darlina Sicilian RDCS Referring Phys: [place]  1. Left ventricular ejection fraction, by estimation, is 60 to 65%. The left ventricle has normal function. There is mild left ventricular hypertrophy. Elevated left ventricular end-diastolic pressure.  2. Left atrial size was moderately dilated.  3. Mean gradient across MV 8 mmHg but MAV by PT1/2 2.8 cm2. The mitral valve is abnormal. Trivial mitral valve regurgitation. Mild mitral stenosis. Severe mitral annular calcification.  4. Post TAVR with 26 mm Sapien 3 valve trivial PVL mean gradient 11 peak 18 mmHg DVI 0.64 AVA 3 cm2 . The aortic valve has been repaired/replaced. There is a 26 mm Sapien prosthetic (TAVR) valve present in the aortic position. Procedure Date: 09/08/2020. FINDINGS  Left Ventricle: Left ventricular ejection fraction, by estimation, is 60 to 65%. The left ventricle has normal function. There is mild left ventricular hypertrophy. Elevated left ventricular end-diastolic pressure. Left Atrium: Left atrial size was moderately dilated. Mitral Valve: Mean gradient across MV 8 mmHg but MAV by PT1/2 2.8 cm2. The mitral valve is abnormal. There is moderate thickening of the mitral valve leaflet(s). There is moderate calcification of the mitral valve leaflet(s). Severe mitral annular calcification. Trivial mitral valve regurgitation. Mild mitral valve stenosis. MV peak gradient, 15.4 mmHg. The mean mitral valve gradient is 8.0 mmHg. Aortic Valve: Post TAVR with 26 mm Sapien 3 valve trivial PVL mean gradient 11 peak 18 mmHg DVI 0.64  AVA 3 cm2. The aortic valve has been repaired/replaced. Aortic valve mean gradient measures 11.5 mmHg. Aortic valve peak gradient measures 18.8 mmHg. Aortic valve area, by VTI measures 3.42 cm. There is a 26 mm Sapien prosthetic, stented (TAVR) valve present in the aortic position. Procedure Date: 09/08/2020. LEFT VENTRICLE PLAX 2D LVIDd:         5.20 cm  Diastology LVIDs:         3.80 cm  LV e' medial:    6.42 cm/s LV PW:         1.30 cm  LV E/e' medial:  28.3 LV IVS:        1.30 cm  LV e' lateral:   5.00 cm/s LVOT diam:     2.60 cm  LV E/e' lateral: 36.4 LV SV:         139 LV SV Index:   63 LVOT Area:     5.31 cm  AORTIC VALVE AV Area (Vmax):    3.30 cm AV Area (Vmean):   3.07 cm AV Area (VTI):     3.42 cm AV Vmax:  217.00 cm/s AV Vmean:          161.000 cm/s AV VTI:            0.405 m AV Peak Grad:      18.8 mmHg AV Mean Grad:      11.5 mmHg LVOT Vmax:         135.00 cm/s LVOT Vmean:        93.100 cm/s LVOT VTI:          0.261 m LVOT/AV VTI ratio: 0.64 MITRAL VALVE MV Area (PHT): 2.91 cm     SHUNTS MV Area VTI:   2.79 cm     Systemic VTI:  0.26 m MV Peak grad:  15.4 mmHg    Systemic Diam: 2.60 cm MV Mean grad:  8.0 mmHg MV Vmax:       1.96 m/s MV Vmean:      133.0 cm/s MV Decel Time: 261 msec MV E velocity: 182.00 cm/s MV A velocity: 115.00 cm/s MV E/A ratio:  1.58 Jenkins Rouge MD Electronically signed by Jenkins Rouge MD Signature Date/Time: 09/09/2020/11:41:32 AM    Final    ECHOCARDIOGRAM LIMITED  Result Date: 09/08/2020    ECHOCARDIOGRAM LIMITED REPORT   Patient Name:   Steven [name] Date of Exam: 09/08/2020 Medical Rec #:  [id number]       Height:       71.0 in Accession #:    [id number]      Weight:       235.0 lb Date of Birth:  1956/06/02       BSA:          2.258 m Patient Age:    63 years        BP:           147/77 mmHg Patient Gender: M               HR:           87 bpm. Exam Location:  Inpatient Procedure: Limited Echo Indications:    Aortic stenosis I35.0  History:        Patient has  prior history of Echocardiogram examinations, most                 recent 06/04/2020. Signs/Symptoms:Dyspnea; Risk Factors:Sleep                 Apnea, Hypertension, Diabetes, Dyslipidemia and Non-Smoker.                 GERD.                 Aortic Valve: 26 mm Sapien prosthetic, stented (TAVR) valve is                 present in the aortic position. Procedure Date: 09/08/2020.  Sonographer:    Vickie Epley RDCS Referring Phys: [phone] [place]  1. Left ventricular ejection fraction, by estimation, is 60 to 65%. The left ventricle has normal function. The left ventricle has no regional wall motion abnormalities. There is mild left ventricular hypertrophy.  2. Right ventricular systolic function is normal. The right ventricular size is normal.  3. Left atrial size was moderately dilated.  4. The mitral valve is degenerative. Trivial mitral valve regurgitation. Mild mitral stenosis. Severe mitral annular calcification.  5. Pre TAVR: difficult images supine in OR. Severely calcified tri leaflet restricted motion Mean gradient 35 peak 56 mmHg AVA 1.1 cm2 Gradients underestimated and valve area over  estimated         Post TAVR: 26 mm Sapien 3 valve Somewhat deeply deployed with stent struts seen near basal septum No significant PvL Mean gradient 4 peak 8 mmHg AVA 3.5 cm2. The aortic valve has been repaired/replaced. Aortic valve regurgitation is not visualized. Severe aortic valve stenosis. There is a 26 mm Sapien prosthetic (TAVR) valve present in the aortic position. Procedure Date: 09/08/2020.  6. The inferior vena cava is normal in size with greater than 50% respiratory variability, suggesting right atrial pressure of 3 mmHg. FINDINGS  Left Ventricle: Left ventricular ejection fraction, by estimation, is 60 to 65%. The left ventricle has normal function. The left ventricle has no regional wall motion abnormalities. The left ventricular internal cavity size was normal in size. There is  mild left  ventricular hypertrophy. Right Ventricle: The right ventricular size is normal. No increase in right ventricular wall thickness. Right ventricular systolic function is normal. Left Atrium: Left atrial size was moderately dilated. Right Atrium: Right atrial size was not assessed. Pericardium: There is no evidence of pericardial effusion. Mitral Valve: The mitral valve is degenerative in appearance. There is severe thickening of the mitral valve leaflet(s). There is severe calcification of the mitral valve leaflet(s). Mildly decreased mobility of the mitral valve leaflets. Severe mitral annular calcification. Trivial mitral valve regurgitation. Mild mitral valve stenosis. Tricuspid Valve: The tricuspid valve is not assessed. Tricuspid valve regurgitation is not demonstrated. No evidence of tricuspid stenosis. Aortic Valve: Pre TAVR: difficult images supine in OR. Severely calcified tri leaflet restricted motion Mean gradient 35 peak 56 mmHg AVA 1.1 cm2 Gradients underestimated and valve area over estimated Post TAVR: 26 mm Sapien 3 valve Somewhat deeply deployed with stent struts seen near basal septum No significant PvL Mean gradient 4 peak 8 mmHg AVA 3.5 cm2. The aortic valve has been repaired/replaced. Aortic valve regurgitation is not visualized. Severe aortic stenosis is present. Aortic valve mean gradient measures 5.0 mmHg. Aortic valve peak gradient measures 7.5 mmHg. Aortic valve area, by VTI measures 3.50 cm. There is a 26 mm Sapien prosthetic, stented (TAVR) valve present in the aortic position. Procedure Date: 09/08/2020. Pulmonic Valve: The pulmonic valve was not assessed. Pulmonic valve regurgitation is not visualized. No evidence of pulmonic stenosis. Aorta: The aortic root is normal in size and structure and aortic root could not be assessed. Venous: The inferior vena cava is normal in size with greater than 50% respiratory variability, suggesting right atrial pressure of 3 mmHg. IAS/Shunts: The  interatrial septum was not assessed. LEFT VENTRICLE PLAX 2D LVOT diam:     2.40 cm LV SV:         100 LV SV Index:   44 LVOT Area:     4.52 cm  AORTIC VALVE AV Area (Vmax):    3.83 cm AV Area (Vmean):   3.71 cm AV Area (VTI):     3.50 cm AV Vmax:           137.00 cm/s AV Vmean:          103.000 cm/s AV VTI:            0.287 m AV Peak Grad:      7.5 mmHg AV Mean Grad:      5.0 mmHg LVOT Vmax:         116.00 cm/s LVOT Vmean:        84.400 cm/s LVOT VTI:          0.222 m LVOT/AV VTI ratio:  0.77  SHUNTS Systemic VTI:  0.22 m Systemic Diam: 2.40 cm Jenkins Rouge MD Electronically signed by Jenkins Rouge MD Signature Date/Time: 09/08/2020/10:05:01 AM    Final    Structural Heart Procedure  Result Date: 09/08/2020 See surgical note for result.  Disposition   Pt is being discharged home today in good condition.  Follow-up Plans & Appointments     Follow-up Information     Eileen Stanford, PA-C. Go on 09/16/2020.   Specialties: Cardiology, Radiology Why: @ 1:30pm, please arrive at least 10 minutes early.  Contact information: [address] [place] [address] [phone]                   Discharge Medications   Allergies as of 09/10/2020       Reactions   Sulfa Antibiotics Rash        Medication List     STOP taking these medications    omeprazole 20 MG capsule Commonly known as: PRILOSEC Replaced by: pantoprazole 40 MG tablet       TAKE these medications    acetaminophen 500 MG tablet Commonly known as: TYLENOL Take 1,000 mg by mouth every 6 (six) hours as needed for moderate pain.   acetaminophen-codeine 300-30 MG tablet Commonly known as: TYLENOL #3 Take 1 tablet by mouth every 8 (eight) hours as needed for moderate pain.   aspirin 81 MG EC tablet Take 1 tablet (81 mg total) by mouth daily. Swallow whole.   atorvastatin 10 MG tablet Commonly known as: LIPITOR Take 1 tablet (10 mg total) by mouth daily.   BD Pen Needle Nano 2nd Gen  32G X 4 MM Misc Generic drug: Insulin Pen Needle Use daily for glucose control   Insulin Pen Needle 32G X 4 MM Misc Use as directed with insulin pen   clopidogrel 75 MG tablet Commonly known as: PLAVIX Take 1 tablet (75 mg total) by mouth daily with breakfast. Start taking on: September 11, 2020   cyclobenzaprine 5 MG tablet Commonly known as: FLEXERIL Take 1 tablet (5 mg total) by mouth 3 (three) times daily as needed for muscle spasms.   docusate sodium 100 MG capsule Commonly known as: COLACE Take 1 capsule (100 mg total) by mouth 2 (two) times daily.   fenofibrate 48 MG tablet Commonly known as: TRICOR Take 1 tablet (48 mg total) by mouth daily.   furosemide 40 MG tablet Commonly known as: LASIX Take 1 tablet (40 mg total) by mouth as needed for fluid or edema. What changed:  when to take this reasons to take this   glyBURIDE 5 MG tablet Commonly known as: DIABETA Take 1 tablet (5 mg total) by mouth 2 (two) times daily with a meal.   hydrALAZINE 25 MG tablet Commonly known as: APRESOLINE Take 1 tablet (25 mg total) by mouth in the morning, at noon, and at bedtime.   insulin aspart 100 UNIT/ML injection Commonly known as: novoLOG Inject 0-30 Units into the skin 3 (three) times daily as needed for high blood sugar. Sliding scale What changed: how much to take   insulin detemir 100 UNIT/ML FlexPen Commonly known as: LEVEMIR Inject 10 Units into the skin daily. What changed: when to take this   mycophenolate 360 MG Tbec EC tablet Commonly known as: MYFORTIC Take 1 tablet (360 mg total) by mouth 2 (two) times daily.   OneTouch Verio test strip Generic drug: glucose blood 1 each by Other route 3 (three) times daily.  pantoprazole 40 MG tablet Commonly known as: PROTONIX Take 1 tablet (40 mg total) by mouth daily. Start taking on: September 11, 2020 Replaces: omeprazole 20 MG capsule   polyethylene glycol 17 g packet Commonly known as: MIRALAX /  GLYCOLAX Take 17 g by mouth daily as needed for mild constipation.   tacrolimus 1 MG capsule Commonly known as: PROGRAF Take 1 capsule (1 mg total) by mouth 2 (two) times daily.   traMADol 50 MG tablet Commonly known as: ULTRAM Take 1 tablet (50 mg total) by mouth every 12 (twelve) hours as needed for moderate pain.   traZODone 50 MG tablet Commonly known as: DESYREL Take 1 tablet (50 mg total) by mouth at bedtime.            Outstanding Labs/Studies   BMET  Duration of Discharge Encounter   Greater than 30 minutes including physician time.  Mable Fill, PA-C 09/10/2020, 10:31 AM [phone]  Patient seen, examined. Available data reviewed. Agree with findings, assessment, and plan as outlined by Nell Range, PA-C.  The patient is independently interviewed and examined.  He is doing very well today.  Lungs are clear, heart is regular rate and rhythm with a 2/6 systolic murmur at the right upper sternal border, abdomen is soft and nontender, extremities have no significant edema.  There are some chronic stasis changes in the right lower leg.  The left BKA site is wrapped.  The patient's echocardiogram is reviewed and shows normal function of his TAVR prosthesis with trivial paravalvular leak and normal gradients through the valve.  The patient's renal function is stable today with a creatinine of 2.0.  His EKG shows left bundle branch block with no change in QRS duration.  He will be discharged home as he is now medically stable.  Follow-up is arranged as above.  All of his questions are answered.  Sherren Mocha, M.D. 09/10/2020 10:56 AM

## 2020-09-10 NOTE — Progress Notes (Signed)
Patient is status post left below-knee amputation.  He was supposed to have a follow-up appointment in my office yesterday.  Unfortunately he was unable to make this appointment because he underwent cardiac procedure.  Visiting with the patient today he said he is glad to see me he said that stump is doing well and he is in the process of being molded for his prosthesis. He is having some difficulties with his right knee.  He states he was getting up with his walker before he was admitted to the hospital and twisted his right knee and hip.  While the hip seem to do fine.  He had significant knee pain over the anterior medial portion of his knee.  At first he was unable to bear weight but now he says he is getting better.  Denies any trauma or falls  Left below-knee amputation stump is healing well swelling is significantly decreased he is wearing to shrinkers.  No cellulitis.  Full range of motion.  Right knee no effusion no erythema no cellulitis.  He has no pain with terminal extension.  When he goes from extension to flexion it reproduces pain along the medial patellar border and a little bit into the medial joint line.  He does not have any swelling.  Spoke with the patient.  I would like for him to follow-up in the office tomorrow and he is in the process of being discharged right now he says he can do this but would like to come in on Monday I said that would be fine at that time we could get x-rays of his knee if this is still significant issue could possibly do a steroid injection in the meantime I have told him to get a supportive knee brace or sleeve.

## 2020-09-11 ENCOUNTER — Telehealth: Payer: Self-pay | Admitting: Physician Assistant

## 2020-09-11 NOTE — Telephone Encounter (Addendum)
  [facility]   MULTIDISCIPLINARY HEART VALVE TEAM   Patient contacted regarding discharge from [facility] on 2/10  Patient understands to follow up with provider Nell Range on 2/16 at [place].  Patient understands discharge instructions? yes Patient understands medications and regimen? yes Patient understands to bring all medications to this visit? yes  Mostly limited by knee pain. Will see ortho Monday. Breathing is much better.   Angelena Form PA-C  [facility]

## 2020-09-14 ENCOUNTER — Ambulatory Visit (INDEPENDENT_AMBULATORY_CARE_PROVIDER_SITE_OTHER): Payer: BC Managed Care – PPO | Admitting: Orthopedic Surgery

## 2020-09-14 ENCOUNTER — Encounter: Payer: Self-pay | Admitting: Physician Assistant

## 2020-09-14 ENCOUNTER — Ambulatory Visit: Payer: Self-pay

## 2020-09-14 VITALS — Ht 71.0 in | Wt 226.0 lb

## 2020-09-14 DIAGNOSIS — M25561 Pain in right knee: Secondary | ICD-10-CM | POA: Diagnosis not present

## 2020-09-14 DIAGNOSIS — Z89512 Acquired absence of left leg below knee: Secondary | ICD-10-CM

## 2020-09-14 DIAGNOSIS — S86891A Other injury of other muscle(s) and tendon(s) at lower leg level, right leg, initial encounter: Secondary | ICD-10-CM

## 2020-09-15 ENCOUNTER — Encounter: Payer: Self-pay | Admitting: Physician Assistant

## 2020-09-15 NOTE — Progress Notes (Signed)
Office Visit Note   Patient: Steven [name]           Date of Birth: 11-26-1955           MRN: [id number] Visit Date: 09/14/2020              Requested by: Isaac Bliss, Rayford Halsted, MD [place],  [place] [address] PCP: Isaac Bliss, Rayford Halsted, MD  Chief Complaint  Patient presents with  . Right Knee - Pain  . Left Leg - Follow-up    BKA in 11/21      HPI: Patient is a 65 year old gentleman who presents for 2 separate issues #1 is status post a left below the knee amputation in November currently having the prosthesis fabricated. Patient states he had acute onset of pain in the right knee where he twisted his knee about a week ago he has difficulty with weightbearing difficulty with getting from a sitting to a standing position.  Assessment & Plan: Visit Diagnoses:  1. Acute pain of right knee   2. Acquired absence of left leg below knee [facility])     Plan: Discussed with the patient he most likely will need short-term skilled nursing admission recommended following up with his primary care physician to arrange this. Patient is unable to get from a sitting to a standing position without assistance. He was placed in a knee immobilizer to unload pressure from the patella tendon.  Follow-Up Instructions: Return in about 3 weeks (around 10/05/2020).   Ortho Exam  Patient is alert, oriented, no adenopathy, well-dressed, normal affect, normal respiratory effort. Examination patient cannot get from a sit to a stand position without assistance. Examination the left transtibial amputation is well-healed the ulcer on the right heel is also healed. He does have active extension of the right knee has pain to palpation over the distal pole of the patella no palpable defect. Patient has a small ulcer over the IP joint of the right great toe 3 mm in diameter 1 mm deep no exposed bone or tendon.  Patient has active extension with no deficit of the patella tendon function  however with the lucency at the calcification origin of the patella tendon patient may have had a small fibrous injury to the proximal aspect that should heal uneventfully with immobilization. However with immobilization patient is unable to independently transfer and was unable to independently transfer with this acute injury. Patient does not have family support to assist at home.  Imaging: No results found. No images are attached to the encounter.  Labs: Lab Results  Component Value Date   HGBA1C 6.8 (H) 09/04/2020   HGBA1C 9.3 (H) 06/01/2020   HGBA1C 9.4 (H) 12/21/2019   ESRSEDRATE 40 (H) 07/17/2017   CRP 11.1 (H) 07/17/2017   REPTSTATUS 06/06/2020 FINAL 06/01/2020   REPTSTATUS 06/06/2020 FINAL 06/01/2020   CULT  06/01/2020    NO GROWTH 5 DAYS Performed at [facility], [place] [address], [place], [place] [address]    CULT  06/01/2020    NO GROWTH 5 DAYS Performed at [facility] [address] [place], [place] [address]      Lab Results  Component Value Date   ALBUMIN 3.6 09/04/2020   ALBUMIN 2.5 (L) 06/11/2020   ALBUMIN 3.0 (L) 06/03/2020   PREALBUMIN 16.1 (L) 07/17/2017    Lab Results  Component Value Date   MG 1.6 (L) 09/09/2020   MG 1.7 06/03/2020   MG 1.5 (L) 07/18/2017  Lab Results  Component Value Date   VD25OH 18 (L) 02/21/2020    Lab Results  Component Value Date   PREALBUMIN 16.1 (L) 07/17/2017   CBC EXTENDED Latest Ref Rng & Units 09/09/2020 09/08/2020 09/08/2020  WBC 4.0 - 10.5 K/uL 3.9(L) - -  RBC 4.22 - 5.81 MIL/uL 3.24(L) - -  HGB 13.0 - 17.0 g/dL 8.5(L) 9.5(L) 10.5(L)  HCT 39.0 - 52.0 % 25.8(L) 28.0(L) 31.0(L)  PLT 150 - 400 K/uL 90(L) - -  NEUTROABS 1,500 - 7,800 cells/uL - - -  LYMPHSABS 850 - 3,900 cells/uL - - -     Body mass index is 31.52 kg/m.  Orders:  Orders Placed This Encounter  Procedures  . XR Knee 1-2 Views Right   No orders of the defined types were placed in this encounter.    Procedures: No procedures  performed  Clinical Data: No additional findings.  ROS:  All other systems negative, except as noted in the HPI. Review of Systems  Objective: Vital Signs: Ht '5\' 11"'$  (1.803 m)   Wt 226 lb (102.5 kg)   BMI 31.52 kg/m   Specialty Comments:  No specialty comments available.  PMFS History: Patient Active Problem List   Diagnosis Date Noted  . 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 Acquired absence of left leg below knee (HCC)   . Severe aortic stenosis   . CKD (chronic kidney disease), stage III ([place]) 06/01/2020  . Chronic pain 02/28/2020  . Vitamin D deficiency 02/25/2020  . Thrombocytopenia ([place]) 02/25/2020  . Normocytic anemia 02/25/2020  . Diabetic polyneuropathy associated with type 2 diabetes mellitus ([place])   . Venous insufficiency   . Obesity, Class III, BMI 40-49.9 (morbid obesity) ([place])   . Kidney transplant recipient 05/24/2015  . Diabetes mellitus ([place]) 05/10/2012  . Hyperlipidemia 05/10/2012  . Essential hypertension 05/10/2012   Past Medical History:  Diagnosis Date  . Bursitis    left elbow  . Cataract    removed by surgery  . Constipation due to opioid therapy    and decreased ambulation  . Diabetes ([place])    type 2  . 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 Corliss Parish., Kidney Transplant  . GERD (gastroesophageal reflux disease)   . HLD (hyperlipidemia)   . Hyperparathyroidism ([place])   . Hypertension    Patient denies this DX, no meds  . Kidney transplant recipient   . Obesity   . Pneumonia   . 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 Sleep apnea    uses CPAP nightly  . Venous insufficiency   . Vitamin D deficiency     Family History  Problem Relation Age of Onset  . Healthy Mother   . Healthy Father   . Colon cancer Neg Hx   . Rectal cancer Neg Hx   . Stomach  cancer Neg Hx     Past Surgical History:  Procedure Laterality Date  . AMPUTATION Right 07/19/2017   Procedure: AMPUTATION RIGHT 2ND TOE;  Surgeon: Leandrew Koyanagi, MD;  Location: [place];  Service: Orthopedics;  Laterality: Right;  . AMPUTATION Left 06/03/2020   Procedure: LEFT BELOW KNEE AMPUTATION;  Surgeon: Newt Minion, MD;  Location: [place];  Service: Orthopedics;  Laterality: Left;  . COLONOSCOPY     greater than 10 yrs ago  . EYE  SURGERY Bilateral    cataracts removed  . INTRAOPERATIVE TRANSTHORACIC ECHOCARDIOGRAM N/A 09/08/2020   Procedure: INTRAOPERATIVE TRANSTHORACIC ECHOCARDIOGRAM;  Surgeon: Sherren Mocha, MD;  Location: [place];  Service: Open Heart Surgery;  Laterality: N/A;  . KIDNEY TRANSPLANT  2006  . NEPHRECTOMY TRANSPLANTED ORGAN  2006  . RIGHT HEART CATH AND CORONARY ANGIOGRAPHY N/A 06/08/2020   Procedure: RIGHT HEART CATH AND CORONARY ANGIOGRAPHY;  Surgeon: Nelva Bush, MD;  Location: [facility];  Service: Cardiovascular;  Laterality: N/A;  . TOOTH EXTRACTION N/A 08/06/2020   Procedure: DENTAL EXTRACTIONS;  Surgeon: [name], DMD;  Location: [facility];  Service: Dentistry;  Laterality: N/A;  . TRANSCATHETER AORTIC VALVE REPLACEMENT, TRANSFEMORAL Left 09/08/2020   Procedure: TRANSCATHETER AORTIC VALVE REPLACEMENT, LEFT TRANSFEMORAL;  Surgeon: Sherren Mocha, MD;  Location: [place];  Service: Open Heart Surgery;  Laterality: Left;  . UPPER GASTROINTESTINAL ENDOSCOPY    . WISDOM TOOTH EXTRACTION     Social History   Occupational History  . Occupation: trooper  Tobacco Use  . Smoking status: Never Smoker  . Smokeless tobacco: Former Systems developer    Types: Secondary school teacher  . Vaping Use: Never used  Substance and Sexual Activity  . Alcohol use: No  . Drug use: No  . Sexual activity: Yes

## 2020-09-15 NOTE — Progress Notes (Unsigned)
HEART AND [place]                                     Cardiology Office Note:    Date:  09/16/2020   ID:  [name], DOB Dec 06, 1955, MRN [id number]  PCP:  Isaac Bliss, Rayford Halsted, MD  [facility] Cardiologist:  Sherren Mocha, MD  [facility] Electrophysiologist:  None   Referring MD: Isaac Bliss, [place]*   TOC s/p TAVR  History of Present Illness:    Steven [name] is a 65 y.o. 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  The pt has poorly controlled diabetes and history of heavyNSAIDuse leading to end-stage renal disease. He underwent renal transplantation in 2006 and has been followed by Dr. [name]. His creatinine has been around 2 for several years.He was hospitalized in November 2021 with a nonhealing left diabetic foot ulcer that failed outpatient antibiotics. 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 Sharol Given. He developed postoperative volume overload with acute diastolic heart failure requiring intravenous diuresis. His BNP was elevated and chest x-ray showed interstitial pulmonary edema consistent with congestive heart failure. He had a longstanding heart murmur but does not remember having been told that he had aortic stenosis. A 2D echocardiogram showed a heavily calcified aortic valve with a mean gradient of 67 mmHg. There is moderate concentric LVH with grade 2 diastolic dysfunction. Ejection fraction was 60 to 65%. There was thickening and calcification of the mitral valve leaflets with moderate mitral annular calcification. There is mild mitral valve stenosis with a mean gradient of 4 mmHg and a peak gradient of 13.7 mmHg. There is mild  MR. Cardiac catheterization showed mild nonobstructive coronary disease with moderate to severely elevated left and right heart pressures. Since discharge home he has been feeling fairly well and his left BKA stump has been healing without signs of infection. He recently saw Dr. Jeanette Caprice has been scheduled to see a prosthetic specialist.  She was evaluated by the multidisciplinary valve team and underwent successful TAVR with a77m Edwards Sapien 3 Ultra THV via the TF approach on 09/08/20. Post operative echo showed EF 60%, normally functioning TAVR with a mean gradient of 11 mm Hg and no PVL. He did have some mild AKI and lasix was held. His BP was elevated and he was started on hydralazine '25mg'$  BID. He was discharged on aspirin and plavix. Given new conduction disturbance with 1st deg AV block and new complete LBBB, a Zio patch was placed at discharge.   Today he presents to clinic for follow up. Here with his mother. Found out he had a fractured right knee. Thankfully, it is feeling much better. No CP or SOB. Has some mild LE edema in right leg that he thinks is related to his knee fracture. No orthopnea or PND. No dizziness or syncope. No blood in stool or urine. No palpitations. Eager to start exercising again. Getting prosthesis fitted soon. Has not had to take any lasix.    Past Medical History:  Diagnosis Date  . Bursitis    left elbow  . Cataract    removed by surgery  . Constipation due to opioid therapy  and decreased ambulation  . Diabetes ([place])    type 2  . Diabetic glomerulosclerosis [facility])    [facility] 03/21/12 by Dr. Corliss Parish., Kidney Transplant  . GERD (gastroesophageal reflux disease)   . HLD (hyperlipidemia)   . Hyperparathyroidism ([place])   . Hypertension    Patient denies this DX, no meds  . Kidney transplant recipient   . Obesity   . Pneumonia   . 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 Sleep apnea    uses CPAP nightly  . Venous insufficiency   . Vitamin D deficiency     Past Surgical History:  Procedure Laterality Date  . AMPUTATION Right 07/19/2017   Procedure: AMPUTATION RIGHT 2ND TOE;  Surgeon: Leandrew Koyanagi, MD;  Location: [place];  Service: Orthopedics;  Laterality: Right;  . AMPUTATION Left 06/03/2020   Procedure: LEFT BELOW KNEE AMPUTATION;  Surgeon: Newt Minion, MD;  Location: [place];  Service: Orthopedics;  Laterality: Left;  . COLONOSCOPY     greater than 10 yrs ago  . EYE SURGERY Bilateral    cataracts removed  . INTRAOPERATIVE TRANSTHORACIC ECHOCARDIOGRAM N/A 09/08/2020   Procedure: INTRAOPERATIVE TRANSTHORACIC ECHOCARDIOGRAM;  Surgeon: Sherren Mocha, MD;  Location: [place];  Service: Open Heart Surgery;  Laterality: N/A;  . KIDNEY TRANSPLANT  2006  . NEPHRECTOMY TRANSPLANTED ORGAN  2006  . RIGHT HEART CATH AND CORONARY ANGIOGRAPHY N/A 06/08/2020   Procedure: RIGHT HEART CATH AND CORONARY ANGIOGRAPHY;  Surgeon: Nelva Bush, MD;  Location: [place] [facility];  Service: Cardiovascular;  Laterality: N/A;  . TOOTH EXTRACTION N/A 08/06/2020   Procedure: DENTAL EXTRACTIONS;  Surgeon: [name], DMD;  Location: [place];  Service: Dentistry;  Laterality: N/A;  . TRANSCATHETER AORTIC VALVE REPLACEMENT, TRANSFEMORAL Left 09/08/2020   Procedure: TRANSCATHETER AORTIC VALVE REPLACEMENT, LEFT TRANSFEMORAL;  Surgeon: Sherren Mocha, MD;  Location: [place];  Service: Open Heart Surgery;  Laterality: Left;  . UPPER GASTROINTESTINAL ENDOSCOPY    . WISDOM TOOTH EXTRACTION      Current Medications: Current Meds  Medication Sig  . acetaminophen (TYLENOL) 500 MG tablet Take 1,000 mg by mouth every 6 (six) hours as needed for moderate pain.  Marland Kitchen acetaminophen-codeine (TYLENOL #3) 300-30 MG tablet Take 1 tablet by mouth every 8 (eight) hours as needed for moderate pain.  Marland Kitchen aspirin EC 81 MG EC tablet Take 1 tablet (81  mg total) by mouth daily. Swallow whole.  Marland Kitchen atorvastatin (LIPITOR) 10 MG tablet Take 1 tablet (10 mg total) by mouth daily.  . clopidogrel (PLAVIX) 75 MG tablet Take 1 tablet (75 mg total) by mouth daily with breakfast.  . cyclobenzaprine (FLEXERIL) 5 MG tablet Take 1 tablet (5 mg total) by mouth 3 (three) times daily as needed for muscle spasms.  Marland Kitchen docusate sodium (COLACE) 100 MG capsule Take 1 capsule (100 mg total) by mouth 2 (two) times daily.  . fenofibrate (TRICOR) 48 MG tablet Take 1 tablet (48 mg total) by mouth daily.  . furosemide (LASIX) 40 MG tablet Take 1 tablet (40 mg total) by mouth as needed for fluid or edema.  Marland Kitchen glyBURIDE (DIABETA) 5 MG tablet Take 1 tablet (5 mg total) by mouth 2 (two) times daily with a meal.  . hydrALAZINE (APRESOLINE) 50 MG tablet Take 1 tablet (50 mg total) by mouth 3 (three) times daily.  . insulin aspart (NOVOLOG)  100 UNIT/ML injection Inject 0-30 Units into the skin 3 (three) times daily as needed for high blood sugar. Sliding scale (Patient taking differently: Inject 5-25 Units into the skin 3 (three) times daily as needed for high blood sugar. Sliding scale)  . insulin detemir (LEVEMIR) 100 UNIT/ML FlexPen Inject 10 Units into the skin daily.  . Insulin Pen Needle (BD PEN NEEDLE NANO 2ND GEN) 32G X 4 MM MISC Use daily for glucose control  . Insulin Pen Needle 32G X 4 MM MISC Use as directed with insulin pen  . mycophenolate (MYFORTIC) 360 MG TBEC EC tablet Take 1 tablet (360 mg total) by mouth 2 (two) times daily.  Glory Rosebush VERIO test strip 1 each by Other route 3 (three) times daily.  . pantoprazole (PROTONIX) 40 MG tablet Take 1 tablet (40 mg total) by mouth daily.  . polyethylene glycol (MIRALAX / GLYCOLAX) 17 g packet Take 17 g by mouth daily as needed for mild constipation.  . tacrolimus (PROGRAF) 1 MG capsule Take 1 capsule (1 mg total) by mouth 2 (two) times daily.  . traMADol (ULTRAM) 50 MG tablet Take 1 tablet (50 mg total) by mouth every 12  (twelve) hours as needed for moderate pain.  . traZODone (DESYREL) 50 MG tablet Take 1 tablet (50 mg total) by mouth at bedtime.  . [DISCONTINUED] hydrALAZINE (APRESOLINE) 25 MG tablet Take 1 tablet (25 mg total) by mouth in the morning, at noon, and at bedtime.     Allergies:   Sulfa antibiotics   Social History   Socioeconomic History  . Marital status: Married    Spouse name: Not on file  . Number of children: Not on file  . Years of education: Not on file  . Highest education level: Not on file  Occupational History  . Occupation: trooper  Tobacco Use  . Smoking status: Never Smoker  . Smokeless tobacco: Former Systems developer    Types: Secondary school teacher  . Vaping Use: Never used  Substance and Sexual Activity  . Alcohol use: No  . Drug use: No  . Sexual activity: Yes  Other Topics Concern  . Not on file  Social History Narrative  . Not on file   Social Determinants of Health   Financial Resource Strain: Not on file  Food Insecurity: Not on file  Transportation Needs: Not on file  Physical Activity: Not on file  Stress: Not on file  Social Connections: Not on file     Family History: The patient's family history includes Healthy in his father and mother. There is no history of Colon cancer, Rectal cancer, or Stomach cancer.  ROS:   Please see the history of present illness.    All other systems reviewed and are negative.  EKGs/Labs/Other Studies Reviewed:    The following studies were reviewed today: TAVR OPERATIVE NOTE   Date of Procedure:09/08/2020  Preoperative Diagnosis:Severe Aortic Stenosis   Postoperative Diagnosis:Same   Procedure:   Transcatheter Aortic Valve Replacement - Percutaneous Transfemoral Approach Edwards Sapien 3 UltraTHV (size84m, model # [id number] serial #[id number]  Co-Surgeons:Bryan KAlveria Apley MD and MSherren Mocha  MD  Anesthesiologist:JohnGermeroth MD  Echocardiographer:PeterNishan MD  Pre-operative Echo Findings: ? Criticalaortic stenosis ? Normalleft ventricular systolic function  Post-operative Echo Findings: ? Noparavalvular leak ? Normalleft ventricular systolic function  _____________________   Echo2/9/22: IMPRESSIONS  1. Left ventricular ejection fraction, by estimation, is 60 to 65%. The  left ventricle has normal function. There is mild left ventricular  hypertrophy. Elevated left  ventricular end-diastolic pressure.  2. Left atrial size was moderately dilated.  3. Mean gradient across MV 8 mmHg but MAV by PT1/2 2.8 cm2. The mitral  valve is abnormal. Trivial mitral valve regurgitation. Mild mitral  stenosis. Severe mitral annular calcification.  4. Post TAVR with 26 mm Sapien 3 valve trivial PVL mean gradient 11 peak  18 mmHg DVI 0.64 AVA 3 cm2 . The aortic valve has been repaired/replaced.  There is a 26 mm Sapien prosthetic (TAVR) valve present in the aortic  position. Procedure Date:  09/08/2020  EKG:  EKG is ordered today.  The ekg ordered today demonstrates sinus with LBBB. HR 76  Recent Labs: 02/21/2020: TSH 1.72 06/04/2020: B Natriuretic Peptide 1,403.3 09/04/2020: ALT 12 09/09/2020: Hemoglobin 8.5; Magnesium 1.6; Platelets 90 09/10/2020: BUN 39; Creatinine, Ser 2.01; Potassium 3.9; Sodium 137  Recent Lipid Panel No results found for: CHOL, TRIG, HDL, CHOLHDL, VLDL, LDLCALC, LDLDIRECT   Risk Assessment/Calculations:       Physical Exam:    VS:  BP 140/60 (BP Location: Left Arm, Patient Position: Sitting, Cuff Size: Normal)   Pulse 78   Ht '5\' 11"'$  (1.803 m)   SpO2 98%   BMI 31.52 kg/m     Wt Readings from Last 3 Encounters:  09/14/20 226 lb (102.5 kg)  09/10/20 226 lb 13.7 oz (102.9 kg)  09/04/20 235 lb (106.6 kg)     GEN:  Well nourished, well developed in no acute distress HEENT: Normal NECK: No JVD; No  carotid bruits LYMPHATICS: No lymphadenopathy CARDIAC: RRR, no murmurs, rubs, gallops. S/p left BKA, mild right LE edema.  RESPIRATORY:  Clear to auscultation without rales, wheezing or rhonchi  ABDOMEN: Soft, non-tender, non-distended MUSCULOSKELETAL:  No edema; No deformity  SKIN: Warm and dry.  Groin sites clear without hematoma or ecchymosis  NEUROLOGIC:  Alert and oriented x 3 PSYCHIATRIC:  Normal affect   ASSESSMENT:    1. S/P TAVR (transcatheter aortic valve replacement)   2. Stage 3b chronic kidney disease ([place])   3. LBBB (left bundle branch block)   4. Essential hypertension   5. Abnormal chest CT    PLAN:    In order of problems listed above:  Severe AS s/p TAVR:doing great. ECG with no HAVB. Groin sites stable. Continue on aspirin and plavix. SBE prophylaxis discussed; the patient is edentulous now. He will see Dr. Benson Norway soon for denture fitting. I will see him back in 1 month for echo and follow up.   AKI superimposed on chronic CKD stage III after renal transplantation: creat 1.5--> 2.10--> 2.01 Baseline creat 1.5-1.7. Lasix was changed to PRN. Will check BMET today.   New LBBB: stable by ECG today. 1st deg AV block resolved.   HTN: BP better controlled today on hydralazine '25mg'$  TID but still borderline 140/60. Will increase hydralazine to '50mg'$  TID and hopefully that will get him to goal. Continue PRN lasix, which he hasn't needed.    Abnormal chest CT:pre TAVR CTs showed mosaic attenuation in the lung parenchyma suggesting of areas of air trapping from small airways disease. Further evaluation with noncontrast high-resolution chest CT is recommended in 3 months to better evaluate these findings.This was not discussed today. Due mid March. Will get this set up at 1 month apt.      Medication Adjustments/Labs and Tests Ordered: Current medicines are reviewed at length with the patient today.  Concerns regarding medicines are outlined above.  Orders Placed This  Encounter  Procedures  . Basic Metabolic Panel (  BMET)  . EKG 12-Lead   Meds ordered this encounter  Medications  . hydrALAZINE (APRESOLINE) 50 MG tablet    Sig: Take 1 tablet (50 mg total) by mouth 3 (three) times daily.    Dispense:  270 tablet    Refill:  3    Patient Instructions  Medication Instructions:  Your physician has recommended you make the following change in your medication:  1-INCREASE Hydralazine 50 mg by mouth three times daily.  *If you need a refill on your cardiac medications before your next appointment, please call your pharmacy*  Lab Work: Your physician recommends that you have lab work today- BMET  If you have labs (blood work) drawn today and your tests are completely normal, you will receive your results only by: Marland Kitchen MyChart Message (if you have MyChart) OR . A paper copy in the mail If you have any lab test that is abnormal or we need to change your treatment, we will call you to review the results.  Testing/Procedures: None ordered today.  Follow-Up: At [facility], you and your health needs are our priority.  As part of our continuing mission to provide you with exceptional heart care, we have created designated Provider Care Teams.  These Care Teams include your primary Cardiologist (physician) and Advanced Practice Providers (APPs -  Physician Assistants and Nurse Practitioners) who all work together to provide you with the care you need, when you need it.  We recommend signing up for the patient portal called "MyChart".  Sign up information is provided on this After Visit Summary.  MyChart is used to connect with patients for Virtual Visits (Telemedicine).  Patients are able to view lab/test results, encounter notes, upcoming appointments, etc.  Non-urgent messages can be sent to your provider as well.   To learn more about what you can do with MyChart, go to [email or web address].    Your next appointment:   10/08/20  The format for your  next appointment:   In Person  Provider:   You may see Sherren Mocha, MD or one of the following Advanced Practice Providers on your designated Care Team:    Kathyrn Drown, NP        Signed, Angelena Form, PA-C  09/16/2020 2:03 PM    [place]

## 2020-09-16 ENCOUNTER — Encounter: Payer: Self-pay | Admitting: Physician Assistant

## 2020-09-16 ENCOUNTER — Other Ambulatory Visit: Payer: Self-pay

## 2020-09-16 ENCOUNTER — Ambulatory Visit: Payer: BC Managed Care – PPO | Admitting: Physician Assistant

## 2020-09-16 VITALS — BP 140/60 | HR 78 | Ht 71.0 in

## 2020-09-16 DIAGNOSIS — Z952 Presence of prosthetic heart valve: Secondary | ICD-10-CM

## 2020-09-16 DIAGNOSIS — I10 Essential (primary) hypertension: Secondary | ICD-10-CM

## 2020-09-16 DIAGNOSIS — I447 Left bundle-branch block, unspecified: Secondary | ICD-10-CM | POA: Diagnosis not present

## 2020-09-16 DIAGNOSIS — N1832 Chronic kidney disease, stage 3b: Secondary | ICD-10-CM

## 2020-09-16 DIAGNOSIS — R9389 Abnormal findings on diagnostic imaging of other specified body structures: Secondary | ICD-10-CM

## 2020-09-16 MED ORDER — HYDRALAZINE HCL 50 MG PO TABS: 50.0000 mg | ORAL_TABLET | Freq: Three times a day (TID) | ORAL | 3 refills | Status: DC

## 2020-09-16 NOTE — Patient Instructions (Addendum)
Medication Instructions:  Your physician has recommended you make the following change in your medication:  1-INCREASE Hydralazine 50 mg by mouth three times daily.  *If you need a refill on your cardiac medications before your next appointment, please call your pharmacy*  Lab Work: Your physician recommends that you have lab work today- BMET  If you have labs (blood work) drawn today and your tests are completely normal, you will receive your results only by: Marland Kitchen MyChart Message (if you have MyChart) OR . A paper copy in the mail If you have any lab test that is abnormal or we need to change your treatment, we will call you to review the results.  Testing/Procedures: None ordered today.  Follow-Up: At [facility], you and your health needs are our priority.  As part of our continuing mission to provide you with exceptional heart care, we have created designated Provider Care Teams.  These Care Teams include your primary Cardiologist (physician) and Advanced Practice Providers (APPs -  Physician Assistants and Nurse Practitioners) who all work together to provide you with the care you need, when you need it.  We recommend signing up for the patient portal called "MyChart".  Sign up information is provided on this After Visit Summary.  MyChart is used to connect with patients for Virtual Visits (Telemedicine).  Patients are able to view lab/test results, encounter notes, upcoming appointments, etc.  Non-urgent messages can be sent to your provider as well.   To learn more about what you can do with MyChart, go to [email or web address].    Your next appointment:   10/08/20  The format for your next appointment:   In Person  Provider:   You may see Sherren Mocha, MD or one of the following Advanced Practice Providers on your designated Care Team:    Kathyrn Drown, NP

## 2020-09-17 LAB — BASIC METABOLIC PANEL
BUN/Creatinine Ratio: 21 (ref 10–24)
BUN: 33 mg/dL — ABNORMAL HIGH (ref 8–27)
CO2: 19 mmol/L — ABNORMAL LOW (ref 20–29)
Calcium: 8.6 mg/dL (ref 8.6–10.2)
Chloride: 108 mmol/L — ABNORMAL HIGH (ref 96–106)
Creatinine, Ser: 1.59 mg/dL — ABNORMAL HIGH (ref 0.76–1.27)
GFR calc Af Amer: 53 mL/min/{1.73_m2} — ABNORMAL LOW (ref >59)
GFR calc non Af Amer: 46 mL/min/{1.73_m2} — ABNORMAL LOW (ref >59)
Glucose: 138 mg/dL — ABNORMAL HIGH (ref 65–99)
Potassium: 4.3 mmol/L (ref 3.5–5.2)
Sodium: 141 mmol/L (ref 134–144)

## 2020-09-23 ENCOUNTER — Telehealth (HOSPITAL_COMMUNITY): Payer: Self-pay

## 2020-09-23 NOTE — Telephone Encounter (Signed)
Transitions of Care Pharmacy   Call attempted for a pharmacy transitions of care follow-up. HIPAA appropriate voicemail was left with call back information provided.   Call attempt #3. Will no longer attempt to follow up for pharmacy TOC.

## 2020-09-30 ENCOUNTER — Ambulatory Visit: Payer: BC Managed Care – PPO | Admitting: Internal Medicine

## 2020-10-02 ENCOUNTER — Other Ambulatory Visit: Payer: Self-pay

## 2020-10-02 DIAGNOSIS — Z89512 Acquired absence of left leg below knee: Secondary | ICD-10-CM

## 2020-10-05 ENCOUNTER — Other Ambulatory Visit: Payer: Self-pay

## 2020-10-05 ENCOUNTER — Encounter: Payer: Self-pay | Admitting: Orthopedic Surgery

## 2020-10-05 ENCOUNTER — Other Ambulatory Visit: Payer: Self-pay | Admitting: Nurse Practitioner

## 2020-10-05 ENCOUNTER — Telehealth: Payer: Self-pay | Admitting: Nurse Practitioner

## 2020-10-05 ENCOUNTER — Ambulatory Visit (INDEPENDENT_AMBULATORY_CARE_PROVIDER_SITE_OTHER): Payer: BC Managed Care – PPO | Admitting: Physician Assistant

## 2020-10-05 DIAGNOSIS — Z89512 Acquired absence of left leg below knee: Secondary | ICD-10-CM

## 2020-10-05 MED ORDER — HYDRALAZINE HCL 50 MG PO TABS: 50.0000 mg | ORAL_TABLET | Freq: Three times a day (TID) | ORAL | 3 refills | Status: DC

## 2020-10-05 NOTE — Telephone Encounter (Signed)
   Pt called this evening to report that his [facility] pharmacy has not received the Rx for hydralazine that was sent in earlier today.  I reviewed his med list and see where the rx was sent, but it appears to have been sent as 'no print' instead of 'normal.'  I've resent in the rx as previously ordered - '50mg'$  TID, #270, 3 refills.  Caller verbalized understanding and was grateful for the call back.  Murray Hodgkins, NP 10/05/2020, 7:47 PM

## 2020-10-05 NOTE — Progress Notes (Signed)
Office Visit Note   Patient: Steven [name]           Date of Birth: 1956/03/13           MRN: [id number] Visit Date: 10/05/2020              Requested by: Isaac Bliss, Rayford Halsted, MD [place],  [place] [address] PCP: Isaac Bliss, Rayford Halsted, MD  Chief Complaint  Patient presents with  . Left Knee - Follow-up  . Right Knee - Follow-up      HPI: Patient presents today he is status post left below-knee amputation 3-1/2 months ago. He also had an onset of acute right knee pain making it difficult for him to to get around. He says today that the right knee pain is completely resolved and he is able to use his right leg again. He is due to pick up his prosthetic this Friday and is already arranged for physical therapy  Assessment & Plan: Visit Diagnoses: No diagnosis found.  Plan: We will follow-up for final visit in 1 month.  Follow-Up Instructions: No follow-ups on file.   Ortho Exam  Patient is alert, oriented, no adenopathy, well-dressed, normal affect, normal respiratory effort. Examination of his right knee he has excellent quadriceps strength no effusion no swelling no tenderness to palpation over the kneecap or the medial or lateral joint line. Left below-knee amputation stump is completely healed. There are small thin eschar on the medial side. There is no erythema no drainage no ascending cellulitis or signs of infection. Swelling is well controlled.  Imaging: No results found. No images are attached to the encounter.  Labs: Lab Results  Component Value Date   HGBA1C 6.8 (H) 09/04/2020   HGBA1C 9.3 (H) 06/01/2020   HGBA1C 9.4 (H) 12/21/2019   ESRSEDRATE 40 (H) 07/17/2017   CRP 11.1 (H) 07/17/2017   REPTSTATUS 06/06/2020 FINAL 06/01/2020   REPTSTATUS 06/06/2020 FINAL 06/01/2020   CULT  06/01/2020    NO GROWTH 5 DAYS Performed at [facility], [place] [address] [place], [place] [address]    CULT  06/01/2020    NO GROWTH 5  DAYS Performed at [facility] [address] [place], [place] [address]      Lab Results  Component Value Date   ALBUMIN 3.6 09/04/2020   ALBUMIN 2.5 (L) 06/11/2020   ALBUMIN 3.0 (L) 06/03/2020   PREALBUMIN 16.1 (L) 07/17/2017    Lab Results  Component Value Date   MG 1.6 (L) 09/09/2020   MG 1.7 06/03/2020   MG 1.5 (L) 07/18/2017   Lab Results  Component Value Date   VD25OH 18 (L) 02/21/2020    Lab Results  Component Value Date   PREALBUMIN 16.1 (L) 07/17/2017   CBC EXTENDED Latest Ref Rng & Units 09/09/2020 09/08/2020 09/08/2020  WBC 4.0 - 10.5 K/uL 3.9(L) - -  RBC 4.22 - 5.81 MIL/uL 3.24(L) - -  HGB 13.0 - 17.0 g/dL 8.5(L) 9.5(L) 10.5(L)  HCT 39.0 - 52.0 % 25.8(L) 28.0(L) 31.0(L)  PLT 150 - 400 K/uL 90(L) - -  NEUTROABS 1,500 - 7,800 cells/uL - - -  LYMPHSABS 850 - 3,900 cells/uL - - -     There is no height or weight on file to calculate BMI.  Orders:  No orders of the defined types were placed in this encounter.  No orders of the defined types were placed in this encounter.    Procedures: No procedures performed  Clinical Data: No additional findings.  ROS:  All other systems negative, except as noted in the HPI. Review of Systems  Objective: Vital Signs: There were no vitals taken for this visit.  Specialty Comments:  No specialty comments available.  PMFS History: Patient Active Problem List   Diagnosis Date Noted  . 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 Acquired absence of left leg below knee (HCC)   . Severe aortic stenosis   . CKD (chronic kidney disease), stage III ([place]) 06/01/2020  . Chronic pain 02/28/2020  . Vitamin D deficiency 02/25/2020  . Thrombocytopenia ([place]) 02/25/2020  . Normocytic anemia 02/25/2020  . Diabetic polyneuropathy associated with type 2 diabetes mellitus ([place])   . Venous insufficiency   . Obesity, Class III, BMI 40-49.9 (morbid obesity) ([place])   . Kidney transplant recipient  05/24/2015  . Diabetes mellitus ([place]) 05/10/2012  . Hyperlipidemia 05/10/2012  . Essential hypertension 05/10/2012   Past Medical History:  Diagnosis Date  . Bursitis    left elbow  . Cataract    removed by surgery  . Constipation due to opioid therapy    and decreased ambulation  . Diabetes ([place])    type 2  . Diabetic glomerulosclerosis [facility])    [facility] 03/21/12 by Dr. Corliss Parish., Kidney Transplant  . GERD (gastroesophageal reflux disease)   . HLD (hyperlipidemia)   . Hyperparathyroidism ([place])   . Hypertension    Patient denies this DX, no meds  . Kidney transplant recipient   . Obesity   . Pneumonia   . 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 Sleep apnea    uses CPAP nightly  . Venous insufficiency   . Vitamin D deficiency     Family History  Problem Relation Age of Onset  . Healthy Mother   . Healthy Father   . Colon cancer Neg Hx   . Rectal cancer Neg Hx   . Stomach cancer Neg Hx     Past Surgical History:  Procedure Laterality Date  . AMPUTATION Right 07/19/2017   Procedure: AMPUTATION RIGHT 2ND TOE;  Surgeon: Leandrew Koyanagi, MD;  Location: [facility];  Service: Orthopedics;  Laterality: Right;  . AMPUTATION Left 06/03/2020   Procedure: LEFT BELOW KNEE AMPUTATION;  Surgeon: Newt Minion, MD;  Location: [facility];  Service: Orthopedics;  Laterality: Left;  . COLONOSCOPY     greater than 10 yrs ago  . EYE SURGERY Bilateral    cataracts removed  . INTRAOPERATIVE TRANSTHORACIC ECHOCARDIOGRAM N/A 09/08/2020   Procedure: INTRAOPERATIVE TRANSTHORACIC ECHOCARDIOGRAM;  Surgeon: Sherren Mocha, MD;  Location: [facility];  Service: Open Heart Surgery;  Laterality: N/A;  . KIDNEY TRANSPLANT  2006  . NEPHRECTOMY TRANSPLANTED ORGAN  2006  . RIGHT HEART CATH AND CORONARY ANGIOGRAPHY N/A 06/08/2020   Procedure: RIGHT HEART CATH AND CORONARY ANGIOGRAPHY;   Surgeon: Nelva Bush, MD;  Location: [facility];  Service: Cardiovascular;  Laterality: N/A;  . TOOTH EXTRACTION N/A 08/06/2020   Procedure: DENTAL EXTRACTIONS;  Surgeon: [name], DMD;  Location: [facility];  Service: Dentistry;  Laterality: N/A;  . TRANSCATHETER AORTIC VALVE REPLACEMENT, TRANSFEMORAL Left 09/08/2020   Procedure: TRANSCATHETER AORTIC VALVE REPLACEMENT, LEFT TRANSFEMORAL;  Surgeon: Sherren Mocha, MD;  Location: [facility];  Service: Open Heart Surgery;  Laterality: Left;  . UPPER GASTROINTESTINAL ENDOSCOPY    . WISDOM TOOTH  EXTRACTION     Social History   Occupational History  . Occupation: trooper  Tobacco Use  . Smoking status: Never Smoker  . Smokeless tobacco: Former Systems developer    Types: Secondary school teacher  . Vaping Use: Never used  Substance and Sexual Activity  . Alcohol use: No  . Drug use: No  . Sexual activity: Yes

## 2020-10-06 ENCOUNTER — Other Ambulatory Visit: Payer: Self-pay

## 2020-10-07 ENCOUNTER — Ambulatory Visit: Payer: BC Managed Care – PPO | Admitting: Internal Medicine

## 2020-10-07 DIAGNOSIS — Z0289 Encounter for other administrative examinations: Secondary | ICD-10-CM

## 2020-10-08 ENCOUNTER — Other Ambulatory Visit: Payer: Self-pay

## 2020-10-08 ENCOUNTER — Ambulatory Visit (HOSPITAL_COMMUNITY): Payer: BC Managed Care – PPO | Attending: Cardiology

## 2020-10-08 ENCOUNTER — Encounter: Payer: Self-pay | Admitting: Physician Assistant

## 2020-10-08 ENCOUNTER — Ambulatory Visit: Payer: BC Managed Care – PPO | Admitting: Physician Assistant

## 2020-10-08 VITALS — BP 164/80 | HR 94

## 2020-10-08 DIAGNOSIS — Z952 Presence of prosthetic heart valve: Secondary | ICD-10-CM | POA: Insufficient documentation

## 2020-10-08 DIAGNOSIS — I447 Left bundle-branch block, unspecified: Secondary | ICD-10-CM

## 2020-10-08 DIAGNOSIS — I10 Essential (primary) hypertension: Secondary | ICD-10-CM | POA: Diagnosis not present

## 2020-10-08 DIAGNOSIS — R0602 Shortness of breath: Secondary | ICD-10-CM

## 2020-10-08 DIAGNOSIS — R9389 Abnormal findings on diagnostic imaging of other specified body structures: Secondary | ICD-10-CM

## 2020-10-08 DIAGNOSIS — N1832 Chronic kidney disease, stage 3b: Secondary | ICD-10-CM | POA: Diagnosis not present

## 2020-10-08 MED ORDER — FUROSEMIDE 40 MG PO TABS: 40.0000 mg | ORAL_TABLET | Freq: Every day | ORAL | 11 refills | Status: DC

## 2020-10-08 NOTE — Progress Notes (Addendum)
HE[facility] AND [place]                                     Cardiology Office Note:    Date:  10/08/2020   ID:  Steven [name], DOB 03-14-1956, MRN [id number]  PCP:  Isaac Bliss, Rayford Halsted, MD  [facility] Cardiologist:  Sherren Mocha, MD  [facility] Electrophysiologist:  None   Referring MD: Isaac Bliss, [place]*   1 month s/p TAVR  History of Present Illness:    Steven [name] is a 65 y.o. 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  The pt has poorly controlled diabetes and history of heavyNSAIDuse leading to end-stage renal disease. He underwent renal transplantation in 2006 and has been followed by Dr. [name]. His creatinine has been around 2 for several years.He was hospitalized in November 2021 with a nonhealing left diabetic foot ulcer that failed outpatient antibiotics. 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 Sharol Given. He developed postoperative volume overload with acute diastolic heart failure requiring intravenous diuresis. His BNP was elevated and chest x-ray showed interstitial pulmonary edema consistent with congestive heart failure. He had a longstanding heart murmur but does not remember having been told that he had aortic stenosis. A 2D echocardiogram showed a heavily calcified aortic valve with a mean gradient of 67 mmHg. There is moderate concentric LVH with grade 2 diastolic dysfunction. Ejection fraction was 60 to 65%. There was thickening and calcification of the mitral valve leaflets with moderate mitral annular calcification. There is mild mitral valve stenosis with a mean gradient of 4 mmHg and a peak gradient of 13.7 mmHg. There is  mild MR. Cardiac catheterization showed mild nonobstructive coronary disease with moderate to severely elevated left and right heart pressures.   He was evaluated by the multidisciplinary valve team and underwent successful TAVR with a25m Edwards Sapien 3 Ultra THV via the TF approach on 09/08/20. Post operative echo showed EF 60%, normally functioning TAVR with a mean gradient of 11 mm Hg and no PVL. He did have some mild AKI and lasix was held. His BP was elevated and he was started on hydralazine '25mg'$  BID. Later, lasix was changed to PRN. He was discharged on aspirin and plavix. Given new conduction disturbance with 1st deg AV block and new complete LBBB, a Zio patch was placed at discharge, which showed no HAVB or pathologic pauses. At follow up I increased his hydralazine to '50mg'$  TID. Repeat BMET showed creat back to baseline ~1.59.   Today he presents to clinic for follow up. Doing okay. Knee is better. He gets his leg prothesis next Monday. Feeling more short of breath recently. Hasnt been able to weigh himself since he has trouble balancing on scale until he gets his leg prosthesis. He does have some mild right LE edema and mild orthopnea. More short of breath with activity. Thinks it has to do with being more sedentary. Eager to become more active after BKA. No chest pain. No dizziness.    Past Medical History:  Diagnosis Date  . Bursitis    left elbow  . Cataract    removed by surgery  . Constipation due  to opioid therapy    and decreased ambulation  . Diabetes ([place])    type 2  . Diabetic glomerulosclerosis [facility])    [facility] 03/21/12 by Dr. Corliss Parish., Kidney Transplant  . GERD (gastroesophageal reflux disease)   . HLD (hyperlipidemia)   . Hyperparathyroidism ([place])   . Hypertension    Patient denies this DX, no meds  . Kidney transplant recipient   . Obesity   . Pneumonia   . 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 Sleep apnea    uses CPAP nightly  . Venous insufficiency   . Vitamin D deficiency     Past Surgical History:  Procedure Laterality Date  . AMPUTATION Right 07/19/2017   Procedure: AMPUTATION RIGHT 2ND TOE;  Surgeon: Leandrew Koyanagi, MD;  Location: [facility];  Service: Orthopedics;  Laterality: Right;  . AMPUTATION Left 06/03/2020   Procedure: LEFT BELOW KNEE AMPUTATION;  Surgeon: Newt Minion, MD;  Location: [facility];  Service: Orthopedics;  Laterality: Left;  . COLONOSCOPY     greater than 10 yrs ago  . EYE SURGERY Bilateral    cataracts removed  . INTRAOPERATIVE TRANSTHORACIC ECHOCARDIOGRAM N/A 09/08/2020   Procedure: INTRAOPERATIVE TRANSTHORACIC ECHOCARDIOGRAM;  Surgeon: Sherren Mocha, MD;  Location: [facility];  Service: Open Heart Surgery;  Laterality: N/A;  . KIDNEY TRANSPLANT  2006  . NEPHRECTOMY TRANSPLANTED ORGAN  2006  . RIGHT HEART CATH AND CORONARY ANGIOGRAPHY N/A 06/08/2020   Procedure: RIGHT HEART CATH AND CORONARY ANGIOGRAPHY;  Surgeon: Nelva Bush, MD;  Location: [facility];  Service: Cardiovascular;  Laterality: N/A;  . TOOTH EXTRACTION N/A 08/06/2020   Procedure: DENTAL EXTRACTIONS;  Surgeon: [name], DMD;  Location: [facility];  Service: Dentistry;  Laterality: N/A;  . TRANSCATHETER AORTIC VALVE REPLACEMENT, TRANSFEMORAL Left 09/08/2020   Procedure: TRANSCATHETER AORTIC VALVE REPLACEMENT, LEFT TRANSFEMORAL;  Surgeon: Sherren Mocha, MD;  Location: [facility];  Service: Open Heart Surgery;  Laterality: Left;  . UPPER GASTROINTESTINAL ENDOSCOPY    . WISDOM TOOTH EXTRACTION      Current Medications: Current Meds  Medication Sig  . acetaminophen (TYLENOL) 500 MG tablet Take 1,000 mg by mouth every 6 (six) hours as needed for moderate pain.  Marland Kitchen acetaminophen-codeine (TYLENOL #3) 300-30 MG tablet Take 1 tablet by mouth every 8 (eight) hours as needed for moderate pain.  Marland Kitchen aspirin EC 81 MG EC tablet Take  1 tablet (81 mg total) by mouth daily. Swallow whole.  Marland Kitchen atorvastatin (LIPITOR) 10 MG tablet Take 1 tablet (10 mg total) by mouth daily.  . clopidogrel (PLAVIX) 75 MG tablet Take 1 tablet (75 mg total) by mouth daily with breakfast.  . cyclobenzaprine (FLEXERIL) 5 MG tablet Take 1 tablet (5 mg total) by mouth 3 (three) times daily as needed for muscle spasms.  Marland Kitchen docusate sodium (COLACE) 100 MG capsule Take 1 capsule (100 mg total) by mouth 2 (two) times daily.  . fenofibrate (TRICOR) 48 MG tablet Take 1 tablet (48 mg total) by mouth daily.  Marland Kitchen glyBURIDE (DIABETA) 5 MG tablet Take 1 tablet (5 mg total) by mouth 2 (two) times daily with a meal.  . hydrALAZINE (APRESOLINE) 50 MG tablet Take 1 tablet (50 mg total) by mouth 3 (three) times daily.  . insulin aspart (NOVOLOG) 100 UNIT/ML injection Inject 0-30 Units into the skin 3 (three) times daily as needed  for high blood sugar. Sliding scale  . insulin detemir (LEVEMIR) 100 UNIT/ML FlexPen Inject 10 Units into the skin daily.  . Insulin Pen Needle (BD PEN NEEDLE NANO 2ND GEN) 32G X 4 MM MISC Use daily for glucose control  . Insulin Pen Needle 32G X 4 MM MISC Use as directed with insulin pen  . mycophenolate (MYFORTIC) 360 MG TBEC EC tablet Take 1 tablet (360 mg total) by mouth 2 (two) times daily.  Glory Rosebush VERIO test strip 1 each by Other route 3 (three) times daily.  . pantoprazole (PROTONIX) 40 MG tablet Take 1 tablet (40 mg total) by mouth daily.  . polyethylene glycol (MIRALAX / GLYCOLAX) 17 g packet Take 17 g by mouth daily as needed for mild constipation.  . tacrolimus (PROGRAF) 1 MG capsule Take 1 capsule (1 mg total) by mouth 2 (two) times daily.  . traMADol (ULTRAM) 50 MG tablet Take 1 tablet (50 mg total) by mouth every 12 (twelve) hours as needed for moderate pain.  . traZODone (DESYREL) 50 MG tablet Take 1 tablet (50 mg total) by mouth at bedtime.  . [DISCONTINUED] furosemide (LASIX) 40 MG tablet Take 1 tablet (40 mg total) by mouth as  needed for fluid or edema.     Allergies:   Sulfa antibiotics   Social History   Socioeconomic History  . Marital status: Married    Spouse name: Not on file  . Number of children: Not on file  . Years of education: Not on file  . Highest education level: Not on file  Occupational History  . Occupation: trooper  Tobacco Use  . Smoking status: Never Smoker  . Smokeless tobacco: Former Systems developer    Types: Secondary school teacher  . Vaping Use: Never used  Substance and Sexual Activity  . Alcohol use: No  . Drug use: No  . Sexual activity: Yes  Other Topics Concern  . Not on file  Social History Narrative  . Not on file   Social Determinants of Health   Financial Resource Strain: Not on file  Food Insecurity: Not on file  Transportation Needs: Not on file  Physical Activity: Not on file  Stress: Not on file  Social Connections: Not on file     Family History: The patient's family history includes Healthy in his father and mother. There is no history of Colon cancer, Rectal cancer, or Stomach cancer.  ROS:   Please see the history of present illness.    All other systems reviewed and are negative.  EKGs/Labs/Other Studies Reviewed:    The following studies were reviewed today: TAVR OPERATIVE NOTE   Date of Procedure:09/08/2020  Preoperative Diagnosis:Severe Aortic Stenosis   Postoperative Diagnosis:Same   Procedure:   Transcatheter Aortic Valve Replacement - Percutaneous Transfemoral Approach Edwards Sapien 3 UltraTHV (size38m, model # [id number] serial #[id number]  Co-Surgeons:Bryan KAlveria Apley MD and MSherren Mocha MD  Anesthesiologist:JohnGermeroth MD  Echocardiographer:PeterNishan MD  Pre-operative Echo Findings: ? Criticalaortic stenosis ? Normalleft ventricular systolic function  Post-operative Echo Findings: ? Noparavalvular  leak ? Normalleft ventricular systolic function  _____________________   Echo2/9/22: IMPRESSIONS  1. Left ventricular ejection fraction, by estimation, is 60 to 65%. The  left ventricle has normal function. There is mild left ventricular  hypertrophy. Elevated left ventricular end-diastolic pressure.  2. Left atrial size was moderately dilated.  3. Mean gradient across MV 8 mmHg but MAV by PT1/2 2.8 cm2. The mitral  valve is abnormal. Trivial mitral valve regurgitation. Mild mitral  stenosis. Severe mitral annular calcification.  4. Post TAVR with 26 mm Sapien 3 valve trivial PVL mean gradient 11 peak  18 mmHg DVI 0.64 AVA 3 cm2 . The aortic valve has been repaired/replaced.  There is a 26 mm Sapien prosthetic (TAVR) valve present in the aortic  position. Procedure Date:  09/08/2020  ___________________  Echo 10/08/2020 IMPRESSIONS    1. 1 month post TAVR, peak/mean trasnaortic gradients are slightly  elevated at 36/18 mmHg, DVI 0.43, trivial paravalvular leak.  2. Left ventricular ejection fraction, by estimation, is 60 to 65%. The  left ventricle has normal function. The left ventricle has no regional  wall motion abnormalities. There is moderate concentric left ventricular  hypertrophy. Left ventricular  diastolic parameters are consistent with Grade II diastolic dysfunction  (pseudonormalization). Elevated left atrial pressure.  3. Right ventricular systolic function is normal. The right ventricular  size is normal. There is normal pulmonary artery systolic pressure. The  estimated right ventricular systolic pressure is 123456 mmHg.  4. Left atrial size was moderately dilated.  5. Right atrial size was moderately dilated.  6. The mitral valve is normal in structure. Mild mitral valve  regurgitation. Moderate mitral stenosis. The mean mitral valve gradient is  8.0 mmHg with average heart rate of 96 bpm. Severe mitral annular  calcification.  7. Tricuspid  valve regurgitation is moderate.  8. Trivial paravalvular leak. The aortic valve has been  repaired/replaced. Aortic valve regurgitation is not visualized. No aortic  stenosis is present. There is a 26 mm Ultra, stented (TAVR) valve present  in the aortic position. Procedure Date:  09/08/2020. Aortic valve mean gradient measures 18.0 mmHg.  9. The inferior vena cava is normal in size with greater than 50%  respiratory variability, suggesting right atrial pressure of 3 mmHg.    EKG:  EKG is NOT ordered today.   Recent Labs: 02/21/2020: TSH 1.72 06/04/2020: B Natriuretic Peptide 1,403.3 09/04/2020: ALT 12 09/09/2020: Hemoglobin 8.5; Magnesium 1.6; Platelets 90 09/16/2020: BUN 33; Creatinine, Ser 1.59; Potassium 4.3; Sodium 141  Recent Lipid Panel No results found for: CHOL, TRIG, HDL, CHOLHDL, VLDL, LDLCALC, LDLDIRECT   Risk Assessment/Calculations:       Physical Exam:    VS:  BP (!) 164/80   Pulse 94   SpO2 96%     Wt Readings from Last 3 Encounters:  09/14/20 226 lb (102.5 kg)  09/10/20 226 lb 13.7 oz (102.9 kg)  09/04/20 235 lb (106.6 kg)     GEN:  Well nourished, well developed in no acute distress HEENT: Normal NECK: mildly elevated JVD; No carotid bruits LYMPHATICS: No lymphadenopathy CARDIAC: RRR, no murmurs, rubs, gallops. S/p left BKA, 1+ right LE edema.  RESPIRATORY: decreased breath sounds at bases  ABDOMEN: Soft, non-tender, non-distended MUSCULOSKELETAL:  No edema; No deformity  SKIN: Warm and dry.   NEUROLOGIC:  Alert and oriented x 3 PSYCHIATRIC:  Normal affect   ASSESSMENT:    1. S/P TAVR (transcatheter aortic valve replacement)   2. Stage 3b chronic kidney disease ([place])   3. LBBB (left bundle branch block)   4. Essential hypertension   5. Abnormal chest CT   6. Shortness of breath    PLAN:    In order of problems listed above:  Severe AS s/p TAVR:echo today shows EF 60%, normally functioning TAVR with a mean gradient of 18 mm hg and trivial  PVL. (Gradients a little more elevated than previous, but in an acceptable range). He has NYHA class II symptoms  but has volume overload currently. SBE prophylaxis discussed; the patient is edentulous now. Continue on aspirin and plavix. He can discontinue plavix after 6 months of therapy (03/2021). I will see him back in 1 year with an echo.   Chronic CKD stage III after renal transplantation: baseline creat 1.5-1.7. Lasix was changed to PRN. Labs at last visit showed creat back to baseline at 1.59. Check BMET today since I am restarting lasix.   New LBBB: Zio patch showed no HAVB or pathologic pauses.  HTN: BP elevated but he has not taken his second scheduled dose of hydralazine today and is mildly volume overloaded. I have asked him to start taking lasix '40mg'$  daily instead of PRN.   Abnormal chest CT:pre TAVR CTs showed mosaic attenuation in the lung parenchyma suggesting of areas of air trapping from small airways disease. Further evaluation with noncontrast high-resolution chest CT is recommended in 3 months to better evaluate these findings. Due mid March. Will get this set up today   Acute on chronic diastolic CHF: appears mildly volume overloaded and having some worsening dyspnea. Check BMET and BNP today. I have asked him to take lasix '40mg'$  daily instead of PRN. Will check BMET in a week given renal transplantation. I will check back in with him at that time to see how he is doing in terms of breathing.    Medication Adjustments/Labs and Tests Ordered: Current medicines are reviewed at length with the patient today.  Concerns regarding medicines are outlined above.  Orders Placed This Encounter  Procedures  . Basic metabolic panel  . Pro b natriuretic peptide (BNP)  . Basic metabolic panel   Meds ordered this encounter  Medications  . furosemide (LASIX) 40 MG tablet    Sig: Take 1 tablet (40 mg total) by mouth daily.    Dispense:  30 tablet    Refill:  11    Patient  Instructions  Medication Instructions:  1) INCREASE LASIX (furosemide) to 40 mg daily *If you need a refill on your cardiac medications before your next appointment, please call your pharmacy*  Lab Work: TODAY! BMET, BNP  IN ONE WEEK: BMET If you have labs (blood work) drawn today and your tests are completely normal, you will receive your results only by: Marland Kitchen MyChart Message (if you have MyChart) OR . A paper copy in the mail If you have any lab test that is abnormal or we need to change your treatment, we will call you to review the results.  Follow-Up: You are scheduled with Dr. Burt Knack on 01/13/2021 at 2:00PM.    Signed, Angelena Form, PA-C  10/08/2020 2:51 PM    [place]

## 2020-10-08 NOTE — Patient Instructions (Addendum)
Medication Instructions:  1) INCREASE LASIX (furosemide) to 40 mg daily *If you need a refill on your cardiac medications before your next appointment, please call your pharmacy*  Lab Work: TODAY! BMET, BNP  IN ONE WEEK: BMET If you have labs (blood work) drawn today and your tests are completely normal, you will receive your results only by: Marland Kitchen MyChart Message (if you have MyChart) OR . A paper copy in the mail If you have any lab test that is abnormal or we need to change your treatment, we will call you to review the results.  Follow-Up: You are scheduled with Dr. Burt Knack on 01/13/2021 at 2:00PM.

## 2020-10-09 LAB — ECHOCARDIOGRAM COMPLETE
AR max vel: 1.92 cm2
AV Area VTI: 2.21 cm2
AV Area mean vel: 1.93 cm2
AV Mean grad: 18 mmHg
AV Peak grad: 32.3 mmHg
Ao pk vel: 2.84 m/s
Area-P 1/2: 2.74 cm2
MV VTI: 2.07 cm2
S' Lateral: 2.9 cm

## 2020-10-09 LAB — BASIC METABOLIC PANEL
BUN/Creatinine Ratio: 15 (ref 10–24)
BUN: 26 mg/dL (ref 8–27)
CO2: 17 mmol/L — ABNORMAL LOW (ref 20–29)
Calcium: 8.2 mg/dL — ABNORMAL LOW (ref 8.6–10.2)
Chloride: 110 mmol/L — ABNORMAL HIGH (ref 96–106)
Creatinine, Ser: 1.76 mg/dL — ABNORMAL HIGH (ref 0.76–1.27)
Glucose: 161 mg/dL — ABNORMAL HIGH (ref 65–99)
Potassium: 4.3 mmol/L (ref 3.5–5.2)
Sodium: 142 mmol/L (ref 134–144)
eGFR: 43 mL/min/{1.73_m2} — ABNORMAL LOW (ref >59)

## 2020-10-09 LAB — PRO B NATRIURETIC PEPTIDE: NT-Pro BNP: 4509 pg/mL — ABNORMAL HIGH (ref 0–210)

## 2020-10-11 ENCOUNTER — Telehealth: Payer: Self-pay | Admitting: Student in an Organized Health Care Education/Training Program

## 2020-10-11 NOTE — Telephone Encounter (Signed)
Paged by operator that patient has increasing LE swelling. He has HFpEF, severe AS s/p TAVR (09/08/20) and prior LLE amputation. Seen in clinic 09/16/20. RLE swelling, lasix 40 mg PO was prn for past few months. On Thursday recommended to take lasix 40 mg PO once daily until Thursday with f/u BMP. Reports worsening edema. Denies SOB. Has had diarrhea for a few days. He has been sleeping in recliner just to get to restroom quicker. Has had issues with diarrhea in the past since he had R AKA and immobility, issues with constipation and diarrhea. Takes laxatives for constipation then develops diarrhea. Less appetite the past week. Has not noticed any wt change although has not been weighing himself daily. Denies any other sx. Skin over R leg is rough which is new, tightness in leg. Seems more c/w venous stasis. When he is in wheelchair legs are down. Asked him to sleep back in bed tonight and put pillows under leg to prop it up.

## 2020-10-15 ENCOUNTER — Other Ambulatory Visit: Payer: BC Managed Care – PPO | Admitting: *Deleted

## 2020-10-15 ENCOUNTER — Other Ambulatory Visit: Payer: Self-pay

## 2020-10-15 DIAGNOSIS — N1832 Chronic kidney disease, stage 3b: Secondary | ICD-10-CM

## 2020-10-15 DIAGNOSIS — Z952 Presence of prosthetic heart valve: Secondary | ICD-10-CM

## 2020-10-15 LAB — BASIC METABOLIC PANEL
BUN/Creatinine Ratio: 13 (ref 10–24)
BUN: 29 mg/dL — ABNORMAL HIGH (ref 8–27)
CO2: 19 mmol/L — ABNORMAL LOW (ref 20–29)
Calcium: 7.9 mg/dL — ABNORMAL LOW (ref 8.6–10.2)
Chloride: 103 mmol/L (ref 96–106)
Creatinine, Ser: 2.2 mg/dL — ABNORMAL HIGH (ref 0.76–1.27)
Glucose: 147 mg/dL — ABNORMAL HIGH (ref 65–99)
Potassium: 4 mmol/L (ref 3.5–5.2)
Sodium: 140 mmol/L (ref 134–144)
eGFR: 33 mL/min/{1.73_m2} — ABNORMAL LOW (ref >59)

## 2020-10-16 ENCOUNTER — Telehealth: Payer: Self-pay

## 2020-10-16 DIAGNOSIS — N1832 Chronic kidney disease, stage 3b: Secondary | ICD-10-CM

## 2020-10-16 MED ORDER — FUROSEMIDE 20 MG PO TABS
20.0000 mg | ORAL_TABLET | Freq: Every day | ORAL | 11 refills | Status: DC
Start: 2020-10-16 — End: 2021-01-14

## 2020-10-16 NOTE — Progress Notes (Signed)
Creat up more with diuresis. Will cut lasix back to '20mg'$  daily and repeat BMET in 1.5 weeks. Thank you!

## 2020-10-16 NOTE — Telephone Encounter (Signed)
-----   Message from Eileen Stanford, PA-C sent at 10/16/2020  9:41 AM EDT ----- Creat up more with diuresis. Will cut lasix back to '20mg'$  daily and repeat BMET in 1.5 weeks. Thank you!

## 2020-10-16 NOTE — Telephone Encounter (Signed)
Reviewed results with patient who verbalized understanding.  Instructed the patient to DECREASE LASIX to 20 mg daily. BMET scheduled 10/30/20. The patient was grateful for call and agrees with plan.

## 2020-10-21 ENCOUNTER — Other Ambulatory Visit: Payer: Self-pay

## 2020-10-21 ENCOUNTER — Ambulatory Visit (INDEPENDENT_AMBULATORY_CARE_PROVIDER_SITE_OTHER)
Admission: RE | Admit: 2020-10-21 | Discharge: 2020-10-21 | Disposition: A | Discharge status: Home / Self Care | Payer: BC Managed Care – PPO | Source: Ambulatory Visit | Attending: Physician Assistant | Admitting: Physician Assistant

## 2020-10-21 DIAGNOSIS — R0602 Shortness of breath: Secondary | ICD-10-CM

## 2020-10-23 ENCOUNTER — Other Ambulatory Visit: Payer: Self-pay | Admitting: Physician Assistant

## 2020-10-23 DIAGNOSIS — J90 Pleural effusion, not elsewhere classified: Secondary | ICD-10-CM

## 2020-10-26 ENCOUNTER — Other Ambulatory Visit: Payer: Self-pay

## 2020-10-26 ENCOUNTER — Ambulatory Visit: Payer: BC Managed Care – PPO | Admitting: Physical Therapy

## 2020-10-26 ENCOUNTER — Ambulatory Visit (HOSPITAL_COMMUNITY)
Admission: RE | Admit: 2020-10-26 | Discharge: 2020-10-26 | Disposition: A | Discharge status: Home / Self Care | Payer: BC Managed Care – PPO | Source: Ambulatory Visit | Attending: Physician Assistant | Admitting: Physician Assistant

## 2020-10-26 ENCOUNTER — Encounter: Payer: Self-pay | Admitting: Physical Therapy

## 2020-10-26 DIAGNOSIS — R2681 Unsteadiness on feet: Secondary | ICD-10-CM | POA: Diagnosis not present

## 2020-10-26 DIAGNOSIS — R2689 Other abnormalities of gait and mobility: Secondary | ICD-10-CM

## 2020-10-26 DIAGNOSIS — Z9181 History of falling: Secondary | ICD-10-CM

## 2020-10-26 DIAGNOSIS — R601 Generalized edema: Secondary | ICD-10-CM

## 2020-10-26 DIAGNOSIS — M6281 Muscle weakness (generalized): Secondary | ICD-10-CM | POA: Diagnosis not present

## 2020-10-26 DIAGNOSIS — Z20822 Contact with and (suspected) exposure to covid-19: Secondary | ICD-10-CM | POA: Diagnosis present

## 2020-10-26 DIAGNOSIS — R293 Abnormal posture: Secondary | ICD-10-CM

## 2020-10-26 LAB — SARS CORONAVIRUS 2 (TAT 6-24 HRS): SARS Coronavirus 2: NEGATIVE

## 2020-10-26 NOTE — Progress Notes (Signed)
   10/26/20 1430  Transfers  Transfers Sit to Stand;Stand to Sit  Sit to Stand 5: Supervision;With upper extremity assist;With armrests;From chair/3-in-1;Other (comment) (to RW for stabilization, uses RLE > LLE)  Stand to Sit 5: Supervision;With upper extremity assist;With armrests;To chair/3-in-1;Other (comment) (from RW for stabilization, uses RLE > LLE)  Ambulation/Gait  Ambulation/Gait Yes  Ambulation/Gait Assistance 4: Min assist  Ambulation/Gait Assistance Details excessive UE weight bearing  Ambulation Distance (Feet) 50 Feet  Assistive device Rolling walker;Prosthesis  Gait Pattern Step-to pattern;Decreased step length - right;Decreased stance time - left;Decreased stride length;Decreased hip/knee flexion - left;Trunk flexed;Abducted - left  Ambulation Surface Level;Indoor  Gait velocity 0.81 ft/sec  Posture/Postural Control  Posture/Postural Control Postural limitations  Postural Limitations Rounded Shoulders;Forward head;Flexed trunk;Weight shift right  Prosthetics  Prosthetic Care Comments  PT instructed in proer donning of liner with pin alignment. First attempt to donned prosthesis in sitting with lock tube & pin aligned. Can stand to weight bear on prosthesis until limb seats in socket & clicks.  He has to stand for 3 min 2 times with marching in place (prosthesis only up ~1" for safety without pin engaged) and was able to seat limb into socket.  PT recommended wearing liner or liner & prsothesis for 2 hrs 2x/day for 1 week. If no issues then increase.  Current prosthetic wear tolerance (days/week)  3-4 days of 10 since delivery, He reports unable to donne due to edema.  Current prosthetic wear tolerance (#hours/day)  2-2.5 hours 1x/day  Current prosthetic weight-bearing tolerance (hours/day)  Pt tolerated standing for 3 minuutes with UE support without c/o limb pain but dyspnea with SpO2 96% HR 108  Edema severe edema pitting with >10sec capillary refill  Residual limb  condition  dry scan on incision, scar adhered, shiny skin with no hair growth, normal color & temperature  Education Provided Skin check;Residual limb care;Prosthetic cleaning;Correct ply sock adjustment;Proper Donning;Proper Doffing;Proper wear schedule/adjustment;Other (comment) (see prosthetic care comments.)

## 2020-10-26 NOTE — Therapy (Signed)
[name] [address] [place], [place], [address] Phone: [phone]   Fax:  [phone]  Physical Therapy Evaluation  Patient Details  Name: Steven [name] MRN: [id number] Date of Birth: 1956-03-16 Referring Provider (PT): Steven Score, MD   Encounter Date: 10/26/2020   PT End of Session - 10/26/20 1653    Visit Number 1    Number of Visits 17    Date for PT Re-Evaluation 12/23/20    Authorization Type [place] PPO    Authorization Time Period $52 co-pay    PT Start Time 1430    PT Stop Time 1516    PT Time Calculation (min) 46 min    Equipment Utilized During Treatment Gait belt    Activity Tolerance Patient tolerated treatment well;Patient limited by fatigue    Behavior During Therapy [facility] for tasks assessed/performed           Past Medical History:  Diagnosis Date  . Bursitis    left elbow  . Cataract    removed by surgery  . Constipation due to opioid therapy    and decreased ambulation  . Diabetes ([place])    type 2  . 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 Corliss Parish., Kidney Transplant  . GERD (gastroesophageal reflux disease)   . HLD (hyperlipidemia)   . Hyperparathyroidism ([place])   . Hypertension    Patient denies this DX, no meds  . Kidney transplant recipient   . Obesity   . Pneumonia   . 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 Sleep apnea    uses CPAP nightly  . Venous insufficiency   . Vitamin D deficiency     Past Surgical History:  Procedure Laterality Date  . AMPUTATION Right 07/19/2017   Procedure: AMPUTATION RIGHT 2ND TOE;  Surgeon: Leandrew Koyanagi, MD;  Location: [place];  Service: Orthopedics;  Laterality: Right;  . AMPUTATION Left 06/03/2020   Procedure: LEFT BELOW KNEE AMPUTATION;  Surgeon: Newt Minion, MD;  Location: [place];  Service:  Orthopedics;  Laterality: Left;  . COLONOSCOPY     greater than 10 yrs ago  . EYE SURGERY Bilateral    cataracts removed  . INTRAOPERATIVE TRANSTHORACIC ECHOCARDIOGRAM N/A 09/08/2020   Procedure: INTRAOPERATIVE TRANSTHORACIC ECHOCARDIOGRAM;  Surgeon: Sherren Mocha, MD;  Location: [place];  Service: Open Heart Surgery;  Laterality: N/A;  . KIDNEY TRANSPLANT  2006  . NEPHRECTOMY TRANSPLANTED ORGAN  2006  . RIGHT HEART CATH AND CORONARY ANGIOGRAPHY N/A 06/08/2020   Procedure: RIGHT HEART CATH AND CORONARY ANGIOGRAPHY;  Surgeon: Nelva Bush, MD;  Location: [place] [facility];  Service: Cardiovascular;  Laterality: N/A;  . TOOTH EXTRACTION N/A 08/06/2020   Procedure: DENTAL EXTRACTIONS;  Surgeon: [name], DMD;  Location: [place];  Service: Dentistry;  Laterality: N/A;  . TRANSCATHETER AORTIC VALVE REPLACEMENT, TRANSFEMORAL Left 09/08/2020   Procedure: TRANSCATHETER AORTIC VALVE REPLACEMENT, LEFT TRANSFEMORAL;  Surgeon: Sherren Mocha, MD;  Location: [place];  Service: Open Heart Surgery;  Laterality: Left;  . UPPER GASTROINTESTINAL ENDOSCOPY    . WISDOM TOOTH EXTRACTION      There were no vitals filed for this visit.    Subjective Assessment - 10/26/20 1432    Subjective This 65yo male was referred to PT bu Steven Score, MD with [id number] (ICD-10-CM) - Acquired absence of left leg  below knee which was performed on 06/03/2020. On 09/08/2020 he underwent open heart surgery with transcatheter aortic valve replacement. He received his prosthesis on 10/16/2020 [name] prost[facility] Brooke Pace, [facility].    Patient is accompained by: Family member   mother, [name]   Pertinent History HTN, HLD, IDDM, morbid obesity, ESRD s/p renal transplantation 2006, PAD, CHF, Left TTA,    Patient Stated Goals to use prosthesis to hunt, watch son play baseball, to get outside to walk, do yardwork    Currently in Pain? No/denies              [facility] PT Assessment - 10/26/20 1430      Assessment   Medical  Diagnosis Z89.512 (ICD-10-CM) - Acquired absence of left leg below kne    Referring Provider (PT) Steven Score, MD    Onset Date/Surgical Date 10/16/20   prosthesis delivery   Hand Dominance Left    Prior Therapy Inpatient rehab & HHPTstopped ~1 month ago      Precautions   Precautions Fall      Balance Screen   Has the patient fallen in the past 6 months Yes    How many times? 1   slid out of bed,   Has the patient had a decrease in activity level because of a fear of falling?  Yes    Is the patient reluctant to leave their home because of a fear of falling?  Yes      [place] residence    Living Arrangements Alone   family are providing 24hr care   Type of [place] entrance   2nd entrance has single step no rail.   Home Layout One level   4" step in bathroom   [place] - 2 wheels;Cane - single point;Bedside commode;Tub bench;Grab bars - tub/shower;Wheelchair - manual      Prior Function   Level of Independence Independent;Independent with household mobility without device;Independent with community mobility without device   no device, full community   Vocation Retired    Leisure hunt, yardwork      Posture/Postural Control   Posture/Postural Control Postural limitations    Postural Limitations Rounded Shoulders;Forward head;Flexed trunk;Weight shift right      ROM / Strength   AROM / PROM / Strength AROM;Strength      AROM   Overall AROM  Within functional limits for tasks performed      Strength   Overall Strength Within functional limits for tasks performed      Transfers   Transfers Sit to Stand;Stand to Sit    Sit to Stand 5: Supervision;With upper extremity assist;With armrests;From chair/3-in-1;Other (comment)   to RW for stabilization, uses RLE > LLE   Stand to Sit 5: Supervision;With upper extremity assist;With armrests;To chair/3-in-1;Other (comment)   from RW for stabilization, uses RLE >  LLE     Ambulation/Gait   Ambulation/Gait Yes    Ambulation/Gait Assistance 4: Min assist    Ambulation/Gait Assistance Details excessive UE weight bearing    Ambulation Distance (Feet) 50 Feet    Assistive device Rolling walker;Prosthesis    Gait Pattern Step-to pattern;Decreased step length - right;Decreased stance time - left;Decreased stride length;Decreased hip/knee flexion - left;Trunk flexed;Abducted - left    Ambulation Surface Level;Indoor    Gait velocity 0.81 ft/sec      Standardized Balance Assessment   Standardized Balance Assessment Berg Balance Test  Berg Balance Test   Sit to Stand Needs minimal aid to stand or to stabilize   requires RW to stabilze   Standing Unsupported Unable to stand 30 seconds unassisted   requires minA for balance reactions   Sitting with Back Unsupported but Feet Supported on Floor or Stool Able to sit safely and securely 2 minutes    Stand to Sit Uses backs of legs against chair to control descent    Transfers Able to transfer safely, definite need of hands    Standing Unsupported with Eyes Closed Needs help to keep from falling   MinA for 2 sec   Standing Unsupported with Feet Together Needs help to attain position and unable to hold for 15 seconds    From Standing, Reach Forward with Outstretched Arm Loses balance while trying/requires external support   reaches with dominant UE with RUE support 5" with minA   From Standing Position, Pick up Object from Floor Unable to try/needs assist to keep balance   reaches to knee level with RW support & losses balance righting   From Standing Position, Turn to Look Behind Over each Shoulder Needs assist to keep from losing balance and falling   MinA to look to side ony / head movment only with no weight shift or trunk rotation   Turn 360 Degrees Needs assistance while turning    Standing Unsupported, Alternately Place Feet on Step/Stool Needs assistance to keep from falling or unable to try    Standing  Unsupported, One Foot in [employer] balance while stepping or standing    Standing on One Leg Unable to try or needs assist to prevent fall    Total [name] 10           Prosthetics Assessment - 10/26/20 1430      Prosthetics   Prosthetic Care Dependent with Skin check;Residual limb care;Care of non-amputated limb;Prosthetic cleaning;Correct ply sock adjustment;Ply sock cleaning;Proper wear schedule/adjustment;Proper weight-bearing schedule/adjustment    Donning prosthesis  Min assist    Doffing prosthesis  Supervision    Current prosthetic wear tolerance (days/week)  3-4 days of 10 since delivery, He reports unable to donne due to edema.    Current prosthetic wear tolerance (#hours/day)  2-2.5 hours 1x/day    Current prosthetic weight-bearing tolerance (hours/day)  Pt tolerated standing for 3 minuutes with UE support without c/o limb pain but dyspnea with SpO2 96% HR 108    Edema severe edema pitting with >10sec capillary refill    Residual limb condition  dry scan on incision, scar adhered, shiny skin with no hair growth, normal color & temperature    Prosthesis Description silicon liner with shuttle pin lock, total contact socket, dynamic response foot                     Objective measurements completed on examination: See above findings.       [facility] PT Treatment/Exercise - 10/26/20 1430      Prosthetics   Prosthetic Care Comments  PT instructed in proer donning of liner with pin alignment. First attempt to donned prosthesis in sitting with lock tube & pin aligned. Can stand to weight bear on prosthesis until limb seats in socket & clicks.  He has to stand for 3 min 2 times with marching in place (prosthesis only up ~1" for safety without pin engaged) and was able to seat limb into socket.  PT recommended wearing liner or liner & prsothesis for 2 hrs 2x/day for  1 week. If no issues then increase.    Education Provided Skin check;Residual limb care;Prosthetic  cleaning;Correct ply sock adjustment;Proper Donning;Proper Doffing;Proper wear schedule/adjustment;Other (comment)   see prosthetic care comments.   Person(s) Educated Patient;Parent(s)    Education Method Explanation;Demonstration;Tactile cues;Verbal cues    Education Method Verbalized understanding;Tactile cues required;Needs further instruction;Verbal cues required                    PT Short Term Goals - 10/26/20 1713      PT SHORT TERM GOAL #1   Title Patient demonstrates proper donning & verbalizes proper cleaning of prosthesis.    Time 4    Period Weeks    Status New    Target Date 11/27/20      PT SHORT TERM GOAL #2   Title Patient tolerates prosthesis >8hrs /day without skin isues.    Time 4    Period Weeks    Status New    Target Date 11/27/20      PT SHORT TERM GOAL #3   Title Patient ambulates 200' with RW & prosthesis with verbal cues only    Time 4    Period Weeks    Status New    Target Date 11/27/20      PT SHORT TERM GOAL #4   Title Patient negotiates ramps & curbs with RW & prosthesis with supervision.    Time 4    Period Weeks    Status New    Target Date 11/27/20             PT Long Term Goals - 10/26/20 1709      PT LONG TERM GOAL #1   Title Patient demonstrates & verbalizes proper prosthetic care to enable safe utiization of prosthesis.    Time 8    Status New    Target Date 12/24/20      PT LONG TERM GOAL #2   Title Patient tolerates prosthesis wear >90% of awake hours without skin issues or limb pain.    Time 8    Period Weeks    Status New    Target Date 12/24/20      PT LONG TERM GOAL #3   Title Berg Balance >36/56 to indicate lower fall risk.    Time 8    Period Weeks    Status New    Target Date 12/24/20      PT LONG TERM GOAL #4   Title Patient ambulates >300' with LRAD & prosthesis modified independent.    Time 8    Period Weeks    Status New    Target Date 12/24/20      PT LONG TERM GOAL #5   Title  Patient negotiates ramps, curbs & stairs with LRAD & prosthesis modified independent.    Time 8    Period Weeks    Status New    Target Date 12/24/20                  Plan - 10/26/20 1655    Clinical Impression Statement This 65yo male underwent a Left Transtibial Amputation on 06/03/2020 and received his frist prosthesis on 10/16/2020.  He has significant edema including residual imb making donning difficult.  He is dependent in proper prosthetic care with IDDM, HTN & PAD increaes risk of skin issues.  He has only been able to wear the prosthesis for 2.5 hrs for 3-4 of 10 days since delivery which function throughout his day.  Berg Balance [name] of 10/56 and need for minA for balance indicates high fall risk.  [name] prosthetic gait has signifcant deviations including heavily dependent on BUE support and gait velocity 0.81 ft/sec indicates high fall risk.  Patient would benefit from skilled PT to improve function & safety with his prosthesis but high co-pay may limit his attendance.    Personal Factors and Comorbidities Comorbidity 3+;Fitness;Time since onset of injury/illness/exacerbation;Other   high copay   Comorbidities HTN, HLD, IDDM, morbid obesity, ESRD s/p renal transplantation 2006, PAD, CHF, Left TTA,    Examination-Activity Limitations Lift;Locomotion Level;Caring for Others;Squat;Stairs;Stand;Transfers    Examination-Participation Restrictions Community Activity;Meal Prep;Other   parenting teenager   Stability/Clinical Decision Making Evolving/Moderate complexity    Clinical Decision Making Moderate    Rehab Potential Good    PT Frequency 2x / week    PT Duration 8 weeks    PT Treatment/Interventions ADLs/Self Care Home Management;DME Instruction;Gait training;Stair training;Functional mobility training;Therapeutic activities;Therapeutic exercise;Balance training;Neuromuscular re-education;Patient/family education;Prosthetic Training;Passive range of motion;Vasopneumatic  Device;Joint Manipulations    PT Next Visit Plan review prosthestic care, instruct in HEP at sink, prosthetic gait with RW including ramps & curbs    Consulted and Agree with Plan of Care Patient;Family member/caregiver    Family Member Consulted mother, Sanjiv Petit           Patient will benefit from skilled therapeutic intervention in order to improve the following deficits and impairments:  Abnormal gait,Cardiopulmonary status limiting activity,Decreased activity tolerance,Decreased balance,Decreased endurance,Decreased knowledge of use of DME,Decreased mobility,Decreased skin integrity,Decreased scar mobility,Decreased strength,Increased edema,Impaired flexibility,Postural dysfunction,Prosthetic Dependency,Obesity,Pain  Visit Diagnosis: Generalized edema  Other abnormalities of gait and mobility  Unsteadiness on feet  Muscle weakness (generalized)  History of falling  Abnormal posture     Problem List Patient Active Problem List   Diagnosis Date Noted  . 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 Acquired absence of left leg below knee (HCC)   . Severe aortic stenosis   . CKD (chronic kidney disease), stage III ([place]) 06/01/2020  . Chronic pain 02/28/2020  . Vitamin D deficiency 02/25/2020  . Thrombocytopenia ([place]) 02/25/2020  . Normocytic anemia 02/25/2020  . Diabetic polyneuropathy associated with type 2 diabetes mellitus ([place])   . Venous insufficiency   . Obesity, Class III, BMI 40-49.9 (morbid obesity) ([place])   . Kidney transplant recipient 05/24/2015  . Diabetes mellitus ([place]) 05/10/2012  . Hyperlipidemia 05/10/2012  . Essential hypertension 05/10/2012    Jamey Reas, PT, DPT 10/26/2020, 5:15 PM  [name] [address] [place], [place], [address] Phone: [phone]   Fax:  [phone]  Name: [name] MRN: [id number] Date of Birth: 1956/01/18

## 2020-10-29 ENCOUNTER — Other Ambulatory Visit: Payer: Self-pay

## 2020-10-29 ENCOUNTER — Ambulatory Visit (HOSPITAL_COMMUNITY)
Admission: RE | Admit: 2020-10-29 | Discharge: 2020-10-29 | Disposition: A | Discharge status: Home / Self Care | Payer: BC Managed Care – PPO | Source: Ambulatory Visit | Attending: Physician Assistant | Admitting: Physician Assistant

## 2020-10-29 ENCOUNTER — Other Ambulatory Visit (HOSPITAL_COMMUNITY): Payer: Self-pay | Admitting: Radiology

## 2020-10-29 ENCOUNTER — Ambulatory Visit (HOSPITAL_COMMUNITY)
Admission: RE | Admit: 2020-10-29 | Discharge: 2020-10-29 | Disposition: A | Discharge status: Home / Self Care | Payer: BC Managed Care – PPO | Source: Ambulatory Visit | Attending: Radiology | Admitting: Radiology

## 2020-10-29 DIAGNOSIS — J90 Pleural effusion, not elsewhere classified: Secondary | ICD-10-CM

## 2020-10-29 DIAGNOSIS — Z952 Presence of prosthetic heart valve: Secondary | ICD-10-CM | POA: Diagnosis not present

## 2020-10-29 HISTORY — PX: IR THORACENTESIS ASP PLEURAL SPACE W/IMG GUIDE: IMG5380

## 2020-10-29 LAB — GRAM STAIN

## 2020-10-29 LAB — LACTATE DEHYDROGENASE, PLEURAL OR PERITONEAL FLUID: LD, Fluid: 46 U/L — ABNORMAL HIGH (ref 3–23)

## 2020-10-29 LAB — PROTEIN, PLEURAL OR PERITONEAL FLUID: Total protein, fluid: <3 g/dL

## 2020-10-29 MED ORDER — LIDOCAINE HCL (PF) 1 % IJ SOLN
INTRAMUSCULAR | Status: DC | PRN
  Administered 2020-10-29: 10 mL

## 2020-10-29 MED ORDER — LIDOCAINE HCL 1 % IJ SOLN
INTRAMUSCULAR | Status: AC
  Filled 2020-10-29: qty 20

## 2020-10-29 NOTE — Procedures (Signed)
Ultrasound-guided diagnostic and therapeutic left thoracentesis performed yielding 1.6 liters of yellow fluid. No immediate complications. Follow-up chest x-ray pending. The fluid was sent to the lab for preordered studies. EBL < 2 cc.

## 2020-10-30 ENCOUNTER — Other Ambulatory Visit: Payer: BC Managed Care – PPO

## 2020-10-30 LAB — CYTOLOGY - NON PAP

## 2020-11-02 ENCOUNTER — Other Ambulatory Visit: Payer: Self-pay

## 2020-11-02 ENCOUNTER — Encounter: Payer: Self-pay | Admitting: Orthopedic Surgery

## 2020-11-02 ENCOUNTER — Other Ambulatory Visit: Payer: BC Managed Care – PPO | Admitting: *Deleted

## 2020-11-02 ENCOUNTER — Ambulatory Visit (INDEPENDENT_AMBULATORY_CARE_PROVIDER_SITE_OTHER): Payer: BC Managed Care – PPO | Admitting: Physician Assistant

## 2020-11-02 DIAGNOSIS — N1832 Chronic kidney disease, stage 3b: Secondary | ICD-10-CM

## 2020-11-02 DIAGNOSIS — Z89512 Acquired absence of left leg below knee: Secondary | ICD-10-CM

## 2020-11-02 LAB — BASIC METABOLIC PANEL
BUN/Creatinine Ratio: 12 (ref 10–24)
BUN: 33 mg/dL — ABNORMAL HIGH (ref 8–27)
CO2: 20 mmol/L (ref 20–29)
Calcium: 8 mg/dL — ABNORMAL LOW (ref 8.6–10.2)
Chloride: 108 mmol/L — ABNORMAL HIGH (ref 96–106)
Creatinine, Ser: 2.68 mg/dL — ABNORMAL HIGH (ref 0.76–1.27)
Glucose: 97 mg/dL (ref 65–99)
Potassium: 3.8 mmol/L (ref 3.5–5.2)
Sodium: 143 mmol/L (ref 134–144)
eGFR: 26 mL/min/{1.73_m2} — ABNORMAL LOW (ref >59)

## 2020-11-02 NOTE — Progress Notes (Signed)
Office Visit Note   Patient: Steven [name]           Date of Birth: Mar 05, 1956           MRN: [id number] Visit Date: 11/02/2020              Requested by: Isaac Bliss, Rayford Halsted, MD [place],  [place] [address] PCP: Isaac Bliss, Rayford Halsted, MD  Chief Complaint  Patient presents with  . Left Leg - Follow-up    06/03/20 left BKA       HPI: Patient presents today he is 5 months status post left below-knee amputation.  He is working with Shirlean Mylar and wearing his prosthetic doing gait training.  He has no concerns.  He does asked me to examine his right foot as he has developed a blister on his heel.  Assessment & Plan: Visit Diagnoses: No diagnosis found.  Plan: He has a PRAFO boot at home and I asked he wear this at much as possible especially at night.  I do not see any evidence of any infection going on now just a blister but we will follow-up and reexamine him in 2 weeks or sooner if any changes.  Follow-Up Instructions: No follow-ups on file.   Ortho Exam  Patient is alert, oriented, no adenopathy, well-dressed, normal affect, normal respiratory effort. Examination of his amputation stump well-healed no swelling no erythema no signs of infection or ascending cellulitis.  Prosthetic is fitting well. Right foot status post amputations.  He does have a blister on the posterior heel.  There is no fluctuance no foul odor no surrounding erythema or ascending cellulitis.  Imaging: No results found. No images are attached to the encounter.  Labs: Lab Results  Component Value Date   HGBA1C 6.8 (H) 09/04/2020   HGBA1C 9.3 (H) 06/01/2020   HGBA1C 9.4 (H) 12/21/2019   ESRSEDRATE 40 (H) 07/17/2017   CRP 11.1 (H) 07/17/2017   REPTSTATUS 10/29/2020 FINAL 10/29/2020   GRAMSTAIN  10/29/2020    WBC PRESENT,BOTH PMN AND MONONUCLEAR NO ORGANISMS SEEN CYTOSPIN SMEAR Performed at [facility], [place] [address] [place], [place] [address]    CULT   06/01/2020    NO GROWTH 5 DAYS Performed at [facility] [address] [place], [place] [address]    CULT  06/01/2020    NO GROWTH 5 DAYS Performed at [facility] [address], [place], [place] [address]      Lab Results  Component Value Date   ALBUMIN 3.6 09/04/2020   ALBUMIN 2.5 (L) 06/11/2020   ALBUMIN 3.0 (L) 06/03/2020   PREALBUMIN 16.1 (L) 07/17/2017    Lab Results  Component Value Date   MG 1.6 (L) 09/09/2020   MG 1.7 06/03/2020   MG 1.5 (L) 07/18/2017   Lab Results  Component Value Date   VD25OH 18 (L) 02/21/2020    Lab Results  Component Value Date   PREALBUMIN 16.1 (L) 07/17/2017   CBC EXTENDED Latest Ref Rng & Units 09/09/2020 09/08/2020 09/08/2020  WBC 4.0 - 10.5 K/uL 3.9(L) - -  RBC 4.22 - 5.81 MIL/uL 3.24(L) - -  HGB 13.0 - 17.0 g/dL 8.5(L) 9.5(L) 10.5(L)  HCT 39.0 - 52.0 % 25.8(L) 28.0(L) 31.0(L)  PLT 150 - 400 K/uL 90(L) - -  NEUTROABS 1,500 - 7,800 cells/uL - - -  LYMPHSABS 850 - 3,900 cells/uL - - -     There is no height or weight on  file to calculate BMI.  Orders:  No orders of the defined types were placed in this encounter.  No orders of the defined types were placed in this encounter.    Procedures: No procedures performed  Clinical Data: No additional findings.  ROS:  All other systems negative, except as noted in the HPI. Review of Systems  Objective: Vital Signs: There were no vitals taken for this visit.  Specialty Comments:  No specialty comments available.  PMFS History: Patient Active Problem List   Diagnosis Date Noted  . 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 Acquired absence of left leg below knee (HCC)   . Severe aortic stenosis   . CKD (chronic kidney disease), stage III ([place]) 06/01/2020  . Chronic pain 02/28/2020  . Vitamin D deficiency 02/25/2020  . Thrombocytopenia ([place]) 02/25/2020  . Normocytic anemia 02/25/2020  . Diabetic polyneuropathy associated with type 2  diabetes mellitus ([place])   . Venous insufficiency   . Obesity, Class III, BMI 40-49.9 (morbid obesity) ([place])   . Kidney transplant recipient 05/24/2015  . Diabetes mellitus ([place]) 05/10/2012  . Hyperlipidemia 05/10/2012  . Essential hypertension 05/10/2012   Past Medical History:  Diagnosis Date  . Bursitis    left elbow  . Cataract    removed by surgery  . Constipation due to opioid therapy    and decreased ambulation  . Diabetes ([place])    type 2  . Diabetic glomerulosclerosis [facility])    [facility] 03/21/12 by Dr. Corliss Parish., Kidney Transplant  . GERD (gastroesophageal reflux disease)   . HLD (hyperlipidemia)   . Hyperparathyroidism ([place])   . Hypertension    Patient denies this DX, no meds  . Kidney transplant recipient   . Obesity   . Pneumonia   . 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 Sleep apnea    uses CPAP nightly  . Venous insufficiency   . Vitamin D deficiency     Family History  Problem Relation Age of Onset  . Healthy Mother   . Healthy Father   . Colon cancer Neg Hx   . Rectal cancer Neg Hx   . Stomach cancer Neg Hx     Past Surgical History:  Procedure Laterality Date  . AMPUTATION Right 07/19/2017   Procedure: AMPUTATION RIGHT 2ND TOE;  Surgeon: Leandrew Koyanagi, MD;  Location: [place];  Service: Orthopedics;  Laterality: Right;  . AMPUTATION Left 06/03/2020   Procedure: LEFT BELOW KNEE AMPUTATION;  Surgeon: Newt Minion, MD;  Location: [place];  Service: Orthopedics;  Laterality: Left;  . COLONOSCOPY     greater than 10 yrs ago  . EYE SURGERY Bilateral    cataracts removed  . INTRAOPERATIVE TRANSTHORACIC ECHOCARDIOGRAM N/A 09/08/2020   Procedure: INTRAOPERATIVE TRANSTHORACIC ECHOCARDIOGRAM;  Surgeon: Sherren Mocha, MD;  Location: [place];  Service: Open Heart Surgery;  Laterality: N/A;  . IR THORACENTESIS ASP PLEURAL SPACE W/IMG  GUIDE  10/29/2020  . KIDNEY TRANSPLANT  2006  . NEPHRECTOMY TRANSPLANTED ORGAN  2006  . RIGHT HEART CATH AND CORONARY ANGIOGRAPHY N/A 06/08/2020   Procedure: RIGHT HEART CATH AND CORONARY ANGIOGRAPHY;  Surgeon: Nelva Bush, MD;  Location: [place] [facility];  Service: Cardiovascular;  Laterality: N/A;  . TOOTH EXTRACTION N/A 08/06/2020   Procedure: DENTAL EXTRACTIONS;  Surgeon: [name], DMD;  Location: [place];  Service: Dentistry;  Laterality: N/A;  . TRANSCATHETER AORTIC VALVE REPLACEMENT, TRANSFEMORAL Left 09/08/2020   Procedure: TRANSCATHETER AORTIC VALVE REPLACEMENT, LEFT TRANSFEMORAL;  Surgeon: Sherren Mocha, MD;  Location: [facility];  Service: Open Heart Surgery;  Laterality: Left;  . UPPER GASTROINTESTINAL ENDOSCOPY    . WISDOM TOOTH EXTRACTION     Social History   Occupational History  . Occupation: trooper  Tobacco Use  . Smoking status: Never Smoker  . Smokeless tobacco: Former Systems developer    Types: Secondary school teacher  . Vaping Use: Never used  Substance and Sexual Activity  . Alcohol use: No  . Drug use: No  . Sexual activity: Yes

## 2020-11-03 ENCOUNTER — Encounter: Payer: BC Managed Care – PPO | Admitting: Physical Therapy

## 2020-11-03 ENCOUNTER — Other Ambulatory Visit: Payer: Self-pay | Admitting: *Deleted

## 2020-11-03 DIAGNOSIS — R7989 Other specified abnormal findings of blood chemistry: Secondary | ICD-10-CM

## 2020-11-06 ENCOUNTER — Other Ambulatory Visit: Payer: Self-pay | Admitting: Physician Assistant

## 2020-11-06 ENCOUNTER — Telehealth: Payer: Self-pay | Admitting: Physician Assistant

## 2020-11-06 MED ORDER — DOXYCYCLINE HYCLATE 100 MG PO TABS: 100.0000 mg | ORAL_TABLET | Freq: Two times a day (BID) | ORAL | 0 refills | Status: DC

## 2020-11-06 NOTE — Telephone Encounter (Signed)
Pt called asking for a CB from Audrea Muscat in regards to a blister on the heel of his foot.   [phone]

## 2020-11-06 NOTE — Telephone Encounter (Signed)
I spoke with the patient. His bllod blister on his heel has popped. There is minimal drainage. No redness or tenderness. Because of his previous history he is requesting an antibiotic. I have asked him to come in today to be seen but he says he cant as he has family coming to town. I will call Him in some doxycycline but he knows if there any changes he will have to go to urgent care or an ER

## 2020-11-10 ENCOUNTER — Other Ambulatory Visit: Payer: Self-pay

## 2020-11-10 ENCOUNTER — Other Ambulatory Visit: Payer: BC Managed Care – PPO

## 2020-11-10 DIAGNOSIS — R7989 Other specified abnormal findings of blood chemistry: Secondary | ICD-10-CM

## 2020-11-10 LAB — BASIC METABOLIC PANEL
BUN/Creatinine Ratio: 13 (ref 10–24)
BUN: 31 mg/dL — ABNORMAL HIGH (ref 8–27)
CO2: 20 mmol/L (ref 20–29)
Calcium: 7.8 mg/dL — ABNORMAL LOW (ref 8.6–10.2)
Chloride: 105 mmol/L (ref 96–106)
Creatinine, Ser: 2.41 mg/dL — ABNORMAL HIGH (ref 0.76–1.27)
Glucose: 73 mg/dL (ref 65–99)
Potassium: 3.7 mmol/L (ref 3.5–5.2)
Sodium: 139 mmol/L (ref 134–144)
eGFR: 29 mL/min/{1.73_m2} — ABNORMAL LOW (ref >59)

## 2020-11-12 ENCOUNTER — Encounter: Payer: BC Managed Care – PPO | Admitting: Physical Therapy

## 2020-11-12 ENCOUNTER — Telehealth: Payer: Self-pay | Admitting: Physical Therapy

## 2020-11-12 NOTE — Telephone Encounter (Signed)
PT returned call to patient. He is staying off leg / foot as much as possible. He is on 10 day prophylactic course of antibiotics.  PT educated patient that the prosthesis with RW would decrease pressure on other foot in transfers, standing & gait. He has been wearing prosthesis for 3 hrs 2x/day for last week or more as PT directed.  PT recommended increasing prosthesis wear to 5hrs 2x/day drying limb/liner half way of each wear. Continue to limit standing & gait as Dr. Sharol Given advised. He canceled today's PT appt and PT recommended attending his appt on Monday which he agreed.

## 2020-11-16 ENCOUNTER — Encounter: Payer: Self-pay | Admitting: Physical Therapy

## 2020-11-16 ENCOUNTER — Ambulatory Visit (INDEPENDENT_AMBULATORY_CARE_PROVIDER_SITE_OTHER): Payer: BC Managed Care – PPO | Admitting: Physical Therapy

## 2020-11-16 ENCOUNTER — Other Ambulatory Visit: Payer: Self-pay

## 2020-11-16 DIAGNOSIS — M6281 Muscle weakness (generalized): Secondary | ICD-10-CM

## 2020-11-16 DIAGNOSIS — R2689 Other abnormalities of gait and mobility: Secondary | ICD-10-CM

## 2020-11-16 DIAGNOSIS — Z9181 History of falling: Secondary | ICD-10-CM | POA: Diagnosis not present

## 2020-11-16 DIAGNOSIS — R293 Abnormal posture: Secondary | ICD-10-CM

## 2020-11-16 DIAGNOSIS — R2681 Unsteadiness on feet: Secondary | ICD-10-CM | POA: Diagnosis not present

## 2020-11-16 NOTE — Patient Instructions (Signed)
Hanger Socks: 1-ply is yellow color at top, 3-ply is green at top, 5-ply is navy blue at top How many ply you need depends on your limb size.  You should have even pressure on your limb when standing & walking.  Guidance points: 1. How ease it goes on? Should be some resistance. Too few it goes on too easily. Too many it takes a lot of work to get it on. 2. How many clicks you get. Especially clicks in sitting. 3. After standing or walking, check knee cap. Bottom should be just under the front lip.  Too few bottom of knee cap sits on indention. Too many bottom is above front lip. 4. Have your feet beside each other & hips over feet. Place hands on your waist. Pelvis Should be level. Too few prosthetic side will be low. Too many prosthetic side will be high.    Get ply socks correct before you leave the house. Take extra socks with you. Take one 3-ply and two 1-ply with you. This is in addition to what you are wearing.    Sweating increases with an amputation. Your body is trying to regulate your temperature & without an extremity, you sweat more easily to cool off. Also prosthetic material like liners do not breath and add hot layers which causes even more sweating. With time your body typically will accommodate to prosthesis and your sweat level will come closer to level with amputation but not pre-amputation level.   You need to pat your limb & liner dry when you notice sweating. If you leave sweat trapped inside your liner, then it can result in a blister.   Signs of sweating in your liner: 1. You are sweating elsewhere on your body or you notice sweat running / dripping.  2. Take note of how high your liner comes up on your limb when you first put your liner on your limb. If you notice that your liner has slipped down, then you probably have sweat inside your liner. A good time to check for liner slippage is when toileting.  3. You feel air bubbles inside your liner. When you liner slips, then  air is allowed in bottom. As you put weight on prosthesis, the air is burp or pushed out. 4. You feel something crawling or moving inside your liner. When sweat runs inside the closed system of liner, it often feels like a bug or something crawling inside your liner.  If any of above symptoms are noted, you need to remove your prosthesis & liner to pat your limb & liner dry. This is permanent need as leaving sweat or water trapped can result in a blister or wound.

## 2020-11-16 NOTE — Therapy (Signed)
[facility] [address] [place], [place], [address] Phone: [phone]   Fax:  [phone]  Physical Therapy Treatment  Patient Details  Name: RAYMUNDO PINALES MRN: [id number] Date of Birth: 1955-10-18 Referring Provider (PT): Meridee Score, MD   Encounter Date: 11/16/2020   PT End of Session - 11/16/20 1348    Visit Number 2    Number of Visits 17    Date for PT Re-Evaluation 12/23/20    Authorization Type [place] PPO    Authorization Time Period $52 co-pay    PT Start Time 1345    PT Stop Time 1430    PT Time Calculation (min) 45 min    Equipment Utilized During Treatment Gait belt    Activity Tolerance Patient tolerated treatment well;Patient limited by fatigue    Behavior During Therapy [facility] for tasks assessed/performed           Past Medical History:  Diagnosis Date  . Bursitis    left elbow  . Cataract    removed by surgery  . Constipation due to opioid therapy    and decreased ambulation  . Diabetes ([place])    type 2  . Diabetic glomerulosclerosis [facility])    [facility] 03/21/12 by Dr. Corliss Parish., Kidney Transplant  . GERD (gastroesophageal reflux disease)   . HLD (hyperlipidemia)   . Hyperparathyroidism ([place])   . Hypertension    Patient denies this DX, no meds  . Kidney transplant recipient   . Obesity   . Pneumonia   . 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 Sleep apnea    uses CPAP nightly  . Venous insufficiency   . Vitamin D deficiency     Past Surgical History:  Procedure Laterality Date  . AMPUTATION Right 07/19/2017   Procedure: AMPUTATION RIGHT 2ND TOE;  Surgeon: Leandrew Koyanagi, MD;  Location: [place];  Service: Orthopedics;  Laterality: Right;  . AMPUTATION Left 06/03/2020   Procedure: LEFT BELOW KNEE AMPUTATION;  Surgeon: Newt Minion, MD;  Location: [place];  Service:  Orthopedics;  Laterality: Left;  . COLONOSCOPY     greater than 10 yrs ago  . EYE SURGERY Bilateral    cataracts removed  . INTRAOPERATIVE TRANSTHORACIC ECHOCARDIOGRAM N/A 09/08/2020   Procedure: INTRAOPERATIVE TRANSTHORACIC ECHOCARDIOGRAM;  Surgeon: Sherren Mocha, MD;  Location: [place];  Service: Open Heart Surgery;  Laterality: N/A;  . IR THORACENTESIS ASP PLEURAL SPACE W/IMG GUIDE  10/29/2020  . KIDNEY TRANSPLANT  2006  . NEPHRECTOMY TRANSPLANTED ORGAN  2006  . RIGHT HEART CATH AND CORONARY ANGIOGRAPHY N/A 06/08/2020   Procedure: RIGHT HEART CATH AND CORONARY ANGIOGRAPHY;  Surgeon: Nelva Bush, MD;  Location: [place] [facility];  Service: Cardiovascular;  Laterality: N/A;  . TOOTH EXTRACTION N/A 08/06/2020   Procedure: DENTAL EXTRACTIONS;  Surgeon: [name], DMD;  Location: [place];  Service: Dentistry;  Laterality: N/A;  . TRANSCATHETER AORTIC VALVE REPLACEMENT, TRANSFEMORAL Left 09/08/2020   Procedure: TRANSCATHETER AORTIC VALVE REPLACEMENT, LEFT TRANSFEMORAL;  Surgeon: Sherren Mocha, MD;  Location: [place];  Service: Open Heart Surgery;  Laterality: Left;  . UPPER GASTROINTESTINAL ENDOSCOPY    . WISDOM TOOTH EXTRACTION      There were no vitals filed for this visit.   Subjective Assessment - 11/16/20 1345    Subjective After talking with PT last week, he has been wearing  prosthesis 5hrs 2x/day drying half way. Both his parents & he thinks that his right heel is healing.    Patient is accompained by: Family member   mother, Davy Deberg   Pertinent History HTN, HLD, IDDM, morbid obesity, ESRD s/p renal transplantation 2006, PAD, CHF, Left TTA,    Patient Stated Goals to use prosthesis to hunt, watch son play baseball, to get outside to walk, do yardwork    Currently in Pain? No/denies                             [facility] Adult PT Treatment/Exercise - 11/16/20 1345      Transfers   Transfers Sit to Stand;Stand to Sit    Sit to Stand 5: Supervision    Sit to  Stand Details Visual cues/gestures for sequencing;Verbal cues for technique    Sit to Stand Details (indicate cue type and reason) goal to not touch RW to stabilize but close for safety    Stand to Sit 5: Supervision;With upper extremity assist;With armrests;To chair/3-in-1;Other (comment)    Stand to Sit Details (indicate cue type and reason) Visual cues/gestures for sequencing;Verbal cues for technique    Stand to Sit Details goal to not touch RW to stabilize but close for safety      Ambulation/Gait   Ambulation/Gait Yes    Ambulation/Gait Assistance 5: Supervision    Ambulation/Gait Assistance Details PT raised RW to proper ht.    Ambulation Distance (Feet) 50 Feet   50' X 2   Assistive device Rolling walker;Prosthesis    Ambulation Surface Level;Indoor      Exercises   Exercises Knee/Hip      Knee/Hip Exercises: Seated   Sit to Sand 1 set;10 reps;with UE support   goal to not touch RW     Prosthetics   Prosthetic Care Comments  PT instructed in adjusting ply socks with too few, too many & corrrect ply socks.  PT instructed in signs of sweating & sweat management.    Current prosthetic wear tolerance (days/week)  daily    Current prosthetic wear tolerance (#hours/day)  5 hrs 2x/day drying half way    Residual limb condition  dry scan on incision, scar adhered, shiny skin with no hair growth, normal color & temperature    Education Provided Skin check;Residual limb care;Prosthetic cleaning;Ply sock cleaning;Correct ply sock adjustment;Proper wear schedule/adjustment;Other (comment)   see prosthetic care comments   Person(s) Educated Patient    Education Method Explanation;Demonstration;Tactile cues;Verbal cues;Handout    Education Method Verbalized understanding;Tactile cues required;Verbal cues required;Needs further instruction                    PT Short Term Goals - 10/26/20 1713      PT SHORT TERM GOAL #1   Title Patient demonstrates proper donning & verbalizes  proper cleaning of prosthesis.    Time 4    Period Weeks    Status New    Target Date 11/27/20      PT SHORT TERM GOAL #2   Title Patient tolerates prosthesis >8hrs /day without skin isues.    Time 4    Period Weeks    Status New    Target Date 11/27/20      PT SHORT TERM GOAL #3   Title Patient ambulates 200' with RW & prosthesis with verbal cues only    Time 4    Period Weeks    Status New  Target Date 11/27/20      PT SHORT TERM GOAL #4   Title Patient negotiates ramps & curbs with RW & prosthesis with supervision.    Time 4    Period Weeks    Status New    Target Date 11/27/20             PT Long Term Goals - 10/26/20 1709      PT LONG TERM GOAL #1   Title Patient demonstrates & verbalizes proper prosthetic care to enable safe utiization of prosthesis.    Time 8    Status New    Target Date 12/24/20      PT LONG TERM GOAL #2   Title Patient tolerates prosthesis wear >90% of awake hours without skin issues or limb pain.    Time 8    Period Weeks    Status New    Target Date 12/24/20      PT LONG TERM GOAL #3   Title Berg Balance >36/56 to indicate lower fall risk.    Time 8    Period Weeks    Status New    Target Date 12/24/20      PT LONG TERM GOAL #4   Title Patient ambulates >300' with LRAD & prosthesis modified independent.    Time 8    Period Weeks    Status New    Target Date 12/24/20      PT LONG TERM GOAL #5   Title Patient negotiates ramps, curbs & stairs with LRAD & prosthesis modified independent.    Time 8    Period Weeks    Status New    Target Date 12/24/20                 Plan - 11/16/20 1348    Clinical Impression Statement PT instructed pt in adjusting ply socks & sweat management. He appears to have better understanding.    Personal Factors and Comorbidities Comorbidity 3+;Fitness;Time since onset of injury/illness/exacerbation;Other   high copay   Comorbidities HTN, HLD, IDDM, morbid obesity, ESRD s/p renal  transplantation 2006, PAD, CHF, Left TTA,    Examination-Activity Limitations Lift;Locomotion Level;Caring for Others;Squat;Stairs;Stand;Transfers    Examination-Participation Restrictions Community Activity;Meal Prep;Other   parenting teenager   Stability/Clinical Decision Making Evolving/Moderate complexity    Rehab Potential Good    PT Frequency 2x / week    PT Duration 8 weeks    PT Treatment/Interventions ADLs/Self Care Home Management;DME Instruction;Gait training;Stair training;Functional mobility training;Therapeutic activities;Therapeutic exercise;Balance training;Neuromuscular re-education;Patient/family education;Prosthetic Training;Passive range of motion;Vasopneumatic Device;Joint Manipulations    PT Next Visit Plan review prosthestic care, instruct in HEP at sink, prosthetic gait with RW including ramps & curbs    Consulted and Agree with Plan of Care Patient    Family Member Consulted --           Patient will benefit from skilled therapeutic intervention in order to improve the following deficits and impairments:  Abnormal gait,Cardiopulmonary status limiting activity,Decreased activity tolerance,Decreased balance,Decreased endurance,Decreased knowledge of use of DME,Decreased mobility,Decreased skin integrity,Decreased scar mobility,Decreased strength,Increased edema,Impaired flexibility,Postural dysfunction,Prosthetic Dependency,Obesity,Pain  Visit Diagnosis: Other abnormalities of gait and mobility  Unsteadiness on feet  Muscle weakness (generalized)  History of falling  Abnormal posture     Problem List Patient Active Problem List   Diagnosis Date Noted  . 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 Acquired absence of left leg below knee (HCC)   . Severe aortic stenosis   . CKD (chronic  kidney disease), stage III ([place]) 06/01/2020  . Chronic pain 02/28/2020  . Vitamin D deficiency 02/25/2020  . Thrombocytopenia ([place]) 02/25/2020  .  Normocytic anemia 02/25/2020  . Diabetic polyneuropathy associated with type 2 diabetes mellitus ([place])   . Venous insufficiency   . Obesity, Class III, BMI 40-49.9 (morbid obesity) ([place])   . Kidney transplant recipient 05/24/2015  . Diabetes mellitus ([place]) 05/10/2012  . Hyperlipidemia 05/10/2012  . Essential hypertension 05/10/2012    Jamey Reas, PT, DPT 11/16/2020, 3:15 PM  [facility] [address] [place], [place], [address] Phone: [phone]   Fax:  [phone]  Name: TYVAN HOCKLEY MRN: [id number] Date of Birth: 11-26-1955

## 2020-11-18 ENCOUNTER — Ambulatory Visit: Payer: BC Managed Care – PPO | Admitting: Physician Assistant

## 2020-11-18 ENCOUNTER — Encounter: Payer: BC Managed Care – PPO | Admitting: Physical Therapy

## 2020-11-23 ENCOUNTER — Ambulatory Visit (INDEPENDENT_AMBULATORY_CARE_PROVIDER_SITE_OTHER): Payer: BC Managed Care – PPO | Admitting: Physical Therapy

## 2020-11-23 ENCOUNTER — Other Ambulatory Visit: Payer: Self-pay

## 2020-11-23 ENCOUNTER — Encounter: Payer: Self-pay | Admitting: Physical Therapy

## 2020-11-23 DIAGNOSIS — R293 Abnormal posture: Secondary | ICD-10-CM

## 2020-11-23 DIAGNOSIS — R2689 Other abnormalities of gait and mobility: Secondary | ICD-10-CM

## 2020-11-23 DIAGNOSIS — M6281 Muscle weakness (generalized): Secondary | ICD-10-CM

## 2020-11-23 DIAGNOSIS — R2681 Unsteadiness on feet: Secondary | ICD-10-CM

## 2020-11-23 DIAGNOSIS — Z9181 History of falling: Secondary | ICD-10-CM

## 2020-11-23 DIAGNOSIS — R601 Generalized edema: Secondary | ICD-10-CM

## 2020-11-23 NOTE — Therapy (Signed)
[facility] Physical Therapy [address] [place], [place], [address] Phone: [phone]   Fax:  [phone]  Physical Therapy Treatment  Patient Details  Name: Steven [name] MRN: [id number] Date of Birth: [birth year] Referring Provider (PT): Meridee Score, MD   Encounter Date: 11/23/2020   PT End of Session - 11/23/20 1350    Visit Number 3    Number of Visits 17    Date for PT Re-Evaluation 12/23/20    Authorization Type [place] PPO    Authorization Time Period $52 co-pay    PT Start Time 1345    PT Stop Time 1430    PT Time Calculation (min) 45 min    Equipment Utilized During Treatment Gait belt    Activity Tolerance Patient tolerated treatment well;Patient limited by fatigue    Behavior During Therapy [facility] for tasks assessed/performed           Past Medical History:  Diagnosis Date  . Bursitis    left elbow  . Cataract    removed by surgery  . Constipation due to opioid therapy    and decreased ambulation  . Diabetes ([place])    type 2  . Diabetic glomerulosclerosis [facility])    [facility] 03/21/12 by Dr. Corliss Parish., Kidney Transplant  . GERD (gastroesophageal reflux disease)   . HLD (hyperlipidemia)   . Hyperparathyroidism ([place])   . Hypertension    Patient denies this DX, no meds  . Kidney transplant recipient   . Obesity   . Pneumonia   . 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 Sleep apnea    uses CPAP nightly  . Venous insufficiency   . Vitamin D deficiency     Past Surgical History:  Procedure Laterality Date  . AMPUTATION Right 07/19/2017   Procedure: AMPUTATION RIGHT 2ND TOE;  Surgeon: Leandrew Koyanagi, MD;  Location: [place];  Service: Orthopedics;  Laterality: Right;  . AMPUTATION Left 06/03/2020   Procedure: LEFT BELOW KNEE AMPUTATION;  Surgeon: Newt Minion, MD;  Location: [place];  Service:  Orthopedics;  Laterality: Left;  . COLONOSCOPY     greater than 10 yrs ago  . EYE SURGERY Bilateral    cataracts removed  . INTRAOPERATIVE TRANSTHORACIC ECHOCARDIOGRAM N/A 09/08/2020   Procedure: INTRAOPERATIVE TRANSTHORACIC ECHOCARDIOGRAM;  Surgeon: Sherren Mocha, MD;  Location: [place];  Service: Open Heart Surgery;  Laterality: N/A;  . IR THORACENTESIS ASP PLEURAL SPACE W/IMG GUIDE  10/29/2020  . KIDNEY TRANSPLANT  2006  . NEPHRECTOMY TRANSPLANTED ORGAN  2006  . RIGHT HEART CATH AND CORONARY ANGIOGRAPHY N/A 06/08/2020   Procedure: RIGHT HEART CATH AND CORONARY ANGIOGRAPHY;  Surgeon: Nelva Bush, MD;  Location: [place] CV LAB;  Service: Cardiovascular;  Laterality: N/A;  . TOOTH EXTRACTION N/A 08/06/2020   Procedure: DENTAL EXTRACTIONS;  Surgeon: [name], DMD;  Location: [place];  Service: Dentistry;  Laterality: N/A;  . TRANSCATHETER AORTIC VALVE REPLACEMENT, TRANSFEMORAL Left 09/08/2020   Procedure: TRANSCATHETER AORTIC VALVE REPLACEMENT, LEFT TRANSFEMORAL;  Surgeon: Sherren Mocha, MD;  Location: [place];  Service: Open Heart Surgery;  Laterality: Left;  . UPPER GASTROINTESTINAL ENDOSCOPY    . WISDOM TOOTH EXTRACTION      There were no vitals filed for this visit.   Subjective Assessment - 11/23/20 1345    Subjective He has been wearing prosthesis 5hrs 2x/day drying half way.  He donnes within 20 min of arising, 2hrs between & off 7-8pm with bed 10-11pm.  Both his parents & he thinks that his right heel is healing.    Patient is accompained by: Family member   mother, Steven [name]   Pertinent History HTN, HLD, IDDM, morbid obesity, ESRD s/p renal transplantation 2006, PAD, CHF, Left TTA,    Patient Stated Goals to use prosthesis to hunt, watch son play baseball, to get outside to walk, do yardwork    Currently in Pain? No/denies                             [facility] Adult PT Treatment/Exercise - 11/23/20 1345      Transfers   Transfers Sit to Stand;Stand to  Sit    Sit to Stand 5: Supervision;With upper extremity assist;With armrests;From chair/3-in-1   goal to not touch RW for stabilization   Sit to Stand Details Visual cues/gestures for sequencing;Verbal cues for technique    Stand to Sit 5: Supervision;With upper extremity assist;With armrests;To chair/3-in-1;Other (comment)   goal to not touch RW for stability   Stand to Sit Details (indicate cue type and reason) Visual cues/gestures for sequencing;Verbal cues for technique      Ambulation/Gait   Ambulation/Gait Yes    Ambulation/Gait Assistance 5: Supervision    Ambulation/Gait Assistance Details verbal cues on upright posture / looking forward and wt shift over prosthesis in stance. Minimizing UE weight bearing on RW.    Ambulation Distance (Feet) 200 Feet    Assistive device Rolling walker;Prosthesis      Exercises   Exercises Knee/Hip      Knee/Hip Exercises: Seated   Sit to Sand 1 set;10 reps;with UE support   goal to not touch RW     Prosthetics   Prosthetic Care Comments  Increase wear to 6hrs 2x/day with off 2 hrs midday.  PT instructed in using prosthesis to access bathroom for showering & ADLs like shaving.    Current prosthetic wear tolerance (days/week)  daily    Current prosthetic wear tolerance (#hours/day)  5 hrs 2x/day drying half way    Residual limb condition  dry scan on incision, scar adhered, shiny skin with no hair growth, normal color & temperature    Education Provided Skin check;Correct ply sock adjustment;Proper wear schedule/adjustment;Other (comment);Prosthetic cleaning   see prosthetic care comments   Person(s) Educated Patient    Education Method Explanation;Verbal cues    Education Method Verbalized understanding;Verbal cues required;Needs further instruction               Balance Exercises - 11/23/20 1345      Balance Exercises: Standing   Standing Eyes Opened Wide ([place]);Head turns;Solid surface;5 reps   right/left, up/down & diagonals    Standing Eyes Opened Limitations tactile & visual (mirror) cues for balance reactions    Standing Eyes Closed Wide ([place]);Foam/compliant surface;5 reps    Standing Eyes Closed Limitations tactile cues on balance reactions             PT Education - 11/23/20 1536    Education Details increasing activity tolerance with walking program   see pt instructions.    Person(s) Educated Patient    Methods Explanation;Verbal cues    Comprehension Verbalized understanding;Verbal cues required;Need further instruction            PT Short Term Goals - 11/23/20 1553      PT SHORT TERM GOAL #1  Title Patient demonstrates proper donning & verbalizes proper cleaning of prosthesis.    Time 4    Period Weeks    Status Achieved    Target Date 11/27/20      PT SHORT TERM GOAL #2   Title Patient tolerates prosthesis >8hrs /day without skin isues.    Time 4    Period Weeks    Status Achieved    Target Date 11/27/20      PT SHORT TERM GOAL #3   Title Patient ambulates 200' with RW & prosthesis with verbal cues only    Time 4    Period Weeks    Status Achieved    Target Date 11/27/20      PT SHORT TERM GOAL #4   Title Patient negotiates ramps & curbs with RW & prosthesis with supervision.    Time 4    Period Weeks    Status On-going    Target Date 11/27/20             PT Long Term Goals - 10/26/20 1709      PT LONG TERM GOAL #1   Title Patient demonstrates & verbalizes proper prosthetic care to enable safe utiization of prosthesis.    Time 8    Status New    Target Date 12/24/20      PT LONG TERM GOAL #2   Title Patient tolerates prosthesis wear >90% of awake hours without skin issues or limb pain.    Time 8    Period Weeks    Status New    Target Date 12/24/20      PT LONG TERM GOAL #3   Title Berg Balance >36/56 to indicate lower fall risk.    Time 8    Period Weeks    Status New    Target Date 12/24/20      PT LONG TERM GOAL #4   Title Patient ambulates >300'  with LRAD & prosthesis modified independent.    Time 8    Period Weeks    Status New    Target Date 12/24/20      PT LONG TERM GOAL #5   Title Patient negotiates ramps, curbs & stairs with LRAD & prosthesis modified independent.    Time 8    Period Weeks    Status New    Target Date 12/24/20                 Plan - 11/23/20 1350    Clinical Impression Statement PT instructed patient in increasing activity level & using prosthesis in ADLs.  PT also worked on standing balance.    Personal Factors and Comorbidities Comorbidity 3+;Fitness;Time since onset of injury/illness/exacerbation;Other   high copay   Comorbidities HTN, HLD, IDDM, morbid obesity, ESRD s/p renal transplantation 2006, PAD, CHF, Left TTA,    Examination-Activity Limitations Lift;Locomotion Level;Caring for Others;Squat;Stairs;Stand;Transfers    Examination-Participation Restrictions Community Activity;Meal Prep;Other   parenting teenager   Stability/Clinical Decision Making Evolving/Moderate complexity    Rehab Potential Good    PT Frequency 2x / week    PT Duration 8 weeks    PT Treatment/Interventions ADLs/Self Care Home Management;DME Instruction;Gait training;Stair training;Functional mobility training;Therapeutic activities;Therapeutic exercise;Balance training;Neuromuscular re-education;Patient/family education;Prosthetic Training;Passive range of motion;Vasopneumatic Device;Joint Manipulations    PT Next Visit Plan review prosthestic care,  prosthetic gait with RW including ramps & curbs    Consulted and Agree with Plan of Care Patient           Patient will  benefit from skilled therapeutic intervention in order to improve the following deficits and impairments:  Abnormal gait,Cardiopulmonary status limiting activity,Decreased activity tolerance,Decreased balance,Decreased endurance,Decreased knowledge of use of DME,Decreased mobility,Decreased skin integrity,Decreased scar mobility,Decreased  strength,Increased edema,Impaired flexibility,Postural dysfunction,Prosthetic Dependency,Obesity,Pain  Visit Diagnosis: Unsteadiness on feet  Other abnormalities of gait and mobility  Muscle weakness (generalized)  History of falling  Abnormal posture  Generalized edema     Problem List Patient Active Problem List   Diagnosis Date Noted  . 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 Acquired absence of left leg below knee (HCC)   . Severe aortic stenosis   . CKD (chronic kidney disease), stage III ([place]) 06/01/2020  . Chronic pain 02/28/2020  . Vitamin D deficiency 02/25/2020  . Thrombocytopenia ([place]) 02/25/2020  . Normocytic anemia 02/25/2020  . Diabetic polyneuropathy associated with type 2 diabetes mellitus ([place])   . Venous insufficiency   . Obesity, Class III, BMI 40-49.9 (morbid obesity) ([place])   . Kidney transplant recipient 05/24/2015  . Diabetes mellitus ([place]) 05/10/2012  . Hyperlipidemia 05/10/2012  . Essential hypertension 05/10/2012    Jamey Reas, PT, DPT 11/23/2020, 3:58 PM  [facility] Physical Therapy [address] [place], [place], [address] Phone: [phone]   Fax:  [phone]  Name: AFOLABI BALTZER MRN: [id number] Date of Birth: Dec 07, 1955

## 2020-11-23 NOTE — Patient Instructions (Signed)
Increasing your activity level is important. °Short distances which is walking from one room to another. Work to increase frequency back to prior level. °Medium distances are entering & exiting your home or community with limited distances. Start with 4 medium walks which is one outing to one location and increase number of tolerated amounts. °Long distance is your highest tolerance for you. Walk until you feel you must rest. Back or leg pain or general fatigue are indicators to maximum tolerance. Monitor by distance or time. Try to walk your BEST distance 1-2 times per day. You should see this increase over time.  °

## 2020-11-25 ENCOUNTER — Ambulatory Visit: Payer: BC Managed Care – PPO | Admitting: Physician Assistant

## 2020-11-25 ENCOUNTER — Encounter: Payer: BC Managed Care – PPO | Admitting: Physical Therapy

## 2020-11-30 ENCOUNTER — Encounter: Payer: Self-pay | Admitting: Physical Therapy

## 2020-11-30 ENCOUNTER — Ambulatory Visit (INDEPENDENT_AMBULATORY_CARE_PROVIDER_SITE_OTHER): Payer: BC Managed Care – PPO | Admitting: Physical Therapy

## 2020-11-30 ENCOUNTER — Other Ambulatory Visit: Payer: Self-pay

## 2020-11-30 DIAGNOSIS — M6281 Muscle weakness (generalized): Secondary | ICD-10-CM | POA: Diagnosis not present

## 2020-11-30 DIAGNOSIS — R601 Generalized edema: Secondary | ICD-10-CM

## 2020-11-30 DIAGNOSIS — R2689 Other abnormalities of gait and mobility: Secondary | ICD-10-CM

## 2020-11-30 DIAGNOSIS — R2681 Unsteadiness on feet: Secondary | ICD-10-CM

## 2020-11-30 DIAGNOSIS — Z9181 History of falling: Secondary | ICD-10-CM | POA: Diagnosis not present

## 2020-11-30 DIAGNOSIS — R293 Abnormal posture: Secondary | ICD-10-CM

## 2020-11-30 NOTE — Therapy (Signed)
[facility] Physical Therapy [address] [place], [place], [address] Phone: [phone]   Fax:  [phone]  Physical Therapy Treatment  Patient Details  Name: Steven [name] MRN: [id number] Date of Birth: March 05, 1956 Referring Provider (PT): Meridee Score, MD   Encounter Date: 11/30/2020   PT End of Session - 11/30/20 1441    Visit Number 4    Number of Visits 17    Date for PT Re-Evaluation 12/23/20    Authorization Type [place] PPO    Authorization Time Period $52 co-pay    PT Start Time 1345    PT Stop Time 1425    PT Time Calculation (min) 40 min    Equipment Utilized During Treatment Gait belt    Activity Tolerance Patient tolerated treatment well;Patient limited by fatigue    Behavior During Therapy [facility] for tasks assessed/performed           Past Medical History:  Diagnosis Date  . Bursitis    left elbow  . Cataract    removed by surgery  . Constipation due to opioid therapy    and decreased ambulation  . Diabetes ([place])    type 2  . Diabetic glomerulosclerosis [facility])    [facility] 03/21/12 by Dr. Corliss Parish., Kidney Transplant  . GERD (gastroesophageal reflux disease)   . HLD (hyperlipidemia)   . Hyperparathyroidism ([place])   . Hypertension    Patient denies this DX, no meds  . Kidney transplant recipient   . Obesity   . Pneumonia   . 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 Sleep apnea    uses CPAP nightly  . Venous insufficiency   . Vitamin D deficiency     Past Surgical History:  Procedure Laterality Date  . AMPUTATION Right 07/19/2017   Procedure: AMPUTATION RIGHT 2ND TOE;  Surgeon: Leandrew Koyanagi, MD;  Location: [place];  Service: Orthopedics;  Laterality: Right;  . AMPUTATION Left 06/03/2020   Procedure: LEFT BELOW KNEE AMPUTATION;  Surgeon: Newt Minion, MD;  Location: [place];  Service:  Orthopedics;  Laterality: Left;  . COLONOSCOPY     greater than 10 yrs ago  . EYE SURGERY Bilateral    cataracts removed  . INTRAOPERATIVE TRANSTHORACIC ECHOCARDIOGRAM N/A 09/08/2020   Procedure: INTRAOPERATIVE TRANSTHORACIC ECHOCARDIOGRAM;  Surgeon: Sherren Mocha, MD;  Location: [place];  Service: Open Heart Surgery;  Laterality: N/A;  . IR THORACENTESIS ASP PLEURAL SPACE W/IMG GUIDE  10/29/2020  . KIDNEY TRANSPLANT  2006  . NEPHRECTOMY TRANSPLANTED ORGAN  2006  . RIGHT HEART CATH AND CORONARY ANGIOGRAPHY N/A 06/08/2020   Procedure: RIGHT HEART CATH AND CORONARY ANGIOGRAPHY;  Surgeon: Nelva Bush, MD;  Location: [place] CV LAB;  Service: Cardiovascular;  Laterality: N/A;  . TOOTH EXTRACTION N/A 08/06/2020   Procedure: DENTAL EXTRACTIONS;  Surgeon: [name], DMD;  Location: [place];  Service: Dentistry;  Laterality: N/A;  . TRANSCATHETER AORTIC VALVE REPLACEMENT, TRANSFEMORAL Left 09/08/2020   Procedure: TRANSCATHETER AORTIC VALVE REPLACEMENT, LEFT TRANSFEMORAL;  Surgeon: Sherren Mocha, MD;  Location: [place];  Service: Open Heart Surgery;  Laterality: Left;  . UPPER GASTROINTESTINAL ENDOSCOPY    . WISDOM TOOTH EXTRACTION      There were no vitals filed for this visit.   Subjective Assessment - 11/30/20 1413    Subjective He relays he had to add Ply socks upon arrival  but now the prosthesis feels better. He also relays he will have Iron transfusion tommorow.    Patient is accompained by: Family member   mother, Steven [name]   Pertinent History HTN, HLD, IDDM, morbid obesity, ESRD s/p renal transplantation 2006, PAD, CHF, Left TTA,    Patient Stated Goals to use prosthesis to hunt, watch son play baseball, to get outside to walk, do yardwork    Currently in Pain? No/denies            [facility] Adult PT Treatment/Exercise - 11/30/20 0001      Transfers   Transfers Sit to Stand;Stand to Sit    Sit to Stand 5: Supervision;With upper extremity assist;With armrests;From chair/3-in-1    goal to not touch RW for stabilization   Sit to Stand Details Visual cues/gestures for sequencing;Verbal cues for technique    Stand to Sit 5: Supervision;With upper extremity assist;With armrests;To chair/3-in-1;Other (comment)   goal to not touch RW for stability   Stand to Sit Details (indicate cue type and reason) Visual cues/gestures for sequencing;Verbal cues for technique      Ambulation/Gait   Ambulation/Gait Yes    Ambulation/Gait Assistance 5: Supervision    Ambulation Distance (Feet) 200 Feet    Assistive device Rolling walker;Prosthesis    Gait Comments added tennis balls to his RW to reduce friction      Exercises   Exercises Knee/Hip      Knee/Hip Exercises: Aerobic   Nustep L5 X 8 min with rest breaks as needed      Knee/Hip Exercises: Standing   Other Standing Knee Exercises balance in RW without UE support for head turns and head nods X10 bilat      Knee/Hip Exercises: Seated   Sit to Sand 1 set;10 reps;with UE support   goal to not touch RW                                            PT Short Term Goals - 11/23/20 1553      PT SHORT TERM GOAL #1   Title Patient demonstrates proper donning & verbalizes proper cleaning of prosthesis.    Time 4    Period Weeks    Status Achieved    Target Date 11/27/20      PT SHORT TERM GOAL #2   Title Patient tolerates prosthesis >8hrs /day without skin isues.    Time 4    Period Weeks    Status Achieved    Target Date 11/27/20      PT SHORT TERM GOAL #3   Title Patient ambulates 200' with RW & prosthesis with verbal cues only    Time 4    Period Weeks    Status Achieved    Target Date 11/27/20      PT SHORT TERM GOAL #4   Title Patient negotiates ramps & curbs with RW & prosthesis with supervision.    Time 4    Period Weeks    Status On-going    Target Date 11/27/20             PT Long Term Goals - 10/26/20 1709      PT LONG TERM GOAL #1   Title Patient demonstrates & verbalizes proper  prosthetic care to enable safe utiization of prosthesis.    Time 8    Status New    Target  Date 12/24/20      PT LONG TERM GOAL #2   Title Patient tolerates prosthesis wear >90% of awake hours without skin issues or limb pain.    Time 8    Period Weeks    Status New    Target Date 12/24/20      PT LONG TERM GOAL #3   Title Berg Balance >36/56 to indicate lower fall risk.    Time 8    Period Weeks    Status New    Target Date 12/24/20      PT LONG TERM GOAL #4   Title Patient ambulates >300' with LRAD & prosthesis modified independent.    Time 8    Period Weeks    Status New    Target Date 12/24/20      PT LONG TERM GOAL #5   Title Patient negotiates ramps, curbs & stairs with LRAD & prosthesis modified independent.    Time 8    Period Weeks    Status New    Target Date 12/24/20                 Plan - 11/30/20 1441    Clinical Impression Statement We discussed importance of short but more frequent activity to his tolerance to build his endurance. Hopefully he will begin to have more energy and not get as [facility] easily after iron infusion.    Personal Factors and Comorbidities Comorbidity 3+;Fitness;Time since onset of injury/illness/exacerbation;Other   high copay   Comorbidities HTN, HLD, IDDM, morbid obesity, ESRD s/p renal transplantation 2006, PAD, CHF, Left TTA,    Examination-Activity Limitations Lift;Locomotion Level;Caring for Others;Squat;Stairs;Stand;Transfers    Examination-Participation Restrictions Community Activity;Meal Prep;Other   parenting teenager   Stability/Clinical Decision Making Evolving/Moderate complexity    Rehab Potential Good    PT Frequency 2x / week    PT Duration 8 weeks    PT Treatment/Interventions ADLs/Self Care Home Management;DME Instruction;Gait training;Stair training;Functional mobility training;Therapeutic activities;Therapeutic exercise;Balance training;Neuromuscular re-education;Patient/family education;Prosthetic  Training;Passive range of motion;Vasopneumatic Device;Joint Manipulations    PT Next Visit Plan review prosthestic care,  prosthetic gait with RW including ramps & curbs    Consulted and Agree with Plan of Care Patient           Patient will benefit from skilled therapeutic intervention in order to improve the following deficits and impairments:  Abnormal gait,Cardiopulmonary status limiting activity,Decreased activity tolerance,Decreased balance,Decreased endurance,Decreased knowledge of use of DME,Decreased mobility,Decreased skin integrity,Decreased scar mobility,Decreased strength,Increased edema,Impaired flexibility,Postural dysfunction,Prosthetic Dependency,Obesity,Pain  Visit Diagnosis: Unsteadiness on feet  Other abnormalities of gait and mobility  Muscle weakness (generalized)  History of falling  Abnormal posture  Generalized edema     Problem List Patient Active Problem List   Diagnosis Date Noted  . 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 Acquired absence of left leg below knee (HCC)   . Severe aortic stenosis   . CKD (chronic kidney disease), stage III ([place]) 06/01/2020  . Chronic pain 02/28/2020  . Vitamin D deficiency 02/25/2020  . Thrombocytopenia ([place]) 02/25/2020  . Normocytic anemia 02/25/2020  . Diabetic polyneuropathy associated with type 2 diabetes mellitus ([place])   . Venous insufficiency   . Obesity, Class III, BMI 40-49.9 (morbid obesity) ([place])   . Kidney transplant recipient 05/24/2015  . Diabetes mellitus ([place]) 05/10/2012  . Hyperlipidemia 05/10/2012  . Essential hypertension 05/10/2012    Silvestre Mesi 11/30/2020, 2:46 PM  [facility] Physical Therapy [address] [place], [place], [address] Phone:  [phone]   Fax:  [phone]  Name: LEDARRIUS ROSATI MRN: [id number] Date of Birth: 12/28/1955

## 2020-12-02 ENCOUNTER — Encounter: Payer: BC Managed Care – PPO | Admitting: Physical Therapy

## 2020-12-07 ENCOUNTER — Encounter: Payer: BC Managed Care – PPO | Admitting: Physical Therapy

## 2020-12-08 ENCOUNTER — Other Ambulatory Visit: Payer: Self-pay | Admitting: Physician Assistant

## 2020-12-08 ENCOUNTER — Telehealth: Payer: Self-pay | Admitting: Physician Assistant

## 2020-12-08 DIAGNOSIS — J90 Pleural effusion, not elsewhere classified: Secondary | ICD-10-CM

## 2020-12-08 NOTE — Telephone Encounter (Signed)
  [place] VALVE TEAM  Patient had a high resolution CT in 09/2020 to follow up on incidental findings on pre TAVR CT scans and was found to have bilateral pleural effusions (L>R). He underwent IR guided thoracentesis on left on 3/13 with 1.6 L of pleural fluid removed. The patient had a dramatic improvement in his breathing and wanted to hold off on draining the right side. The pt called into our office over the weekend to discuss worsening right lung pressure similar to symptoms on left side before it was drained. He also is having trouble sleeping due to shortness of breath. He denies any worsening edema or weight gain. He has been compliant with his Lasix '40mg'$  daily. I offered him an office visit for evaluation but the pt preferred to just get right thoracentesis set up. He has follow up with Dr. Burt Knack in June. I have set up right thoracentesis for this coming Friday 5/13.    Angelena Form PA-C  [facility]  Pager [phone]

## 2020-12-09 ENCOUNTER — Ambulatory Visit (HOSPITAL_COMMUNITY)
Admission: RE | Admit: 2020-12-09 | Discharge: 2020-12-09 | Disposition: A | Discharge status: Home / Self Care | Payer: BC Managed Care – PPO | Source: Ambulatory Visit | Attending: Physician Assistant | Admitting: Physician Assistant

## 2020-12-09 ENCOUNTER — Encounter: Payer: BC Managed Care – PPO | Admitting: Physical Therapy

## 2020-12-09 DIAGNOSIS — Z01812 Encounter for preprocedural laboratory examination: Secondary | ICD-10-CM | POA: Insufficient documentation

## 2020-12-09 DIAGNOSIS — Z20822 Contact with and (suspected) exposure to covid-19: Secondary | ICD-10-CM | POA: Diagnosis not present

## 2020-12-09 LAB — SARS CORONAVIRUS 2 (TAT 6-24 HRS): SARS Coronavirus 2: NEGATIVE

## 2020-12-11 ENCOUNTER — Other Ambulatory Visit: Payer: Self-pay

## 2020-12-11 ENCOUNTER — Ambulatory Visit (HOSPITAL_COMMUNITY)
Admission: RE | Admit: 2020-12-11 | Discharge: 2020-12-11 | Disposition: A | Discharge status: Home / Self Care | Payer: BC Managed Care – PPO | Source: Ambulatory Visit | Attending: Physician Assistant | Admitting: Physician Assistant

## 2020-12-11 ENCOUNTER — Other Ambulatory Visit (HOSPITAL_COMMUNITY): Payer: Self-pay | Admitting: Physician Assistant

## 2020-12-11 DIAGNOSIS — J90 Pleural effusion, not elsewhere classified: Secondary | ICD-10-CM | POA: Insufficient documentation

## 2020-12-11 HISTORY — PX: IR THORACENTESIS ASP PLEURAL SPACE W/IMG GUIDE: IMG5380

## 2020-12-11 MED ORDER — LIDOCAINE HCL 1 % IJ SOLN
INTRAMUSCULAR | Status: AC
  Filled 2020-12-11: qty 20

## 2020-12-11 NOTE — Procedures (Signed)
PROCEDURE SUMMARY:  Successful image-guided right thoracentesis. Yielded 1.5 liters of clear yellow fluid. Patient tolerated procedure well. EBL < 1 mL No immediate complications.  Specimen was not sent for labs. Post procedure CXR shows no pneumothorax.  Please see imaging section of Epic for full dictation.  Joaquim Nam PA-C 12/11/2020 10:57 AM

## 2020-12-13 ENCOUNTER — Other Ambulatory Visit: Payer: Self-pay | Admitting: Internal Medicine

## 2020-12-13 ENCOUNTER — Other Ambulatory Visit: Payer: Self-pay | Admitting: Physical Medicine and Rehabilitation

## 2020-12-13 DIAGNOSIS — Z794 Long term (current) use of insulin: Secondary | ICD-10-CM

## 2020-12-14 ENCOUNTER — Encounter: Payer: BC Managed Care – PPO | Admitting: Physical Therapy

## 2020-12-16 ENCOUNTER — Encounter: Payer: Self-pay | Admitting: Physical Therapy

## 2020-12-16 ENCOUNTER — Telehealth: Payer: Self-pay | Admitting: Physical Therapy

## 2020-12-16 ENCOUNTER — Encounter: Payer: BC Managed Care – PPO | Admitting: Physical Therapy

## 2020-12-16 NOTE — Telephone Encounter (Signed)
PT left message that PT is cancelling remaining 3 appointments and discharging at his time due to multiple no-show & missed appointments. Upon chart review, it appears that he is having medical issues limiting his ability to attend outpatient PT.  PT recommended getting new referral for PT once he is medically stable to enable him to attend PT.

## 2020-12-16 NOTE — Therapy (Signed)
[facility] [address] [place], [place], [address] Phone: [phone]   Fax:  [phone]  Patient Details  Name: Steven [name] MRN: [id number] Date of Birth: January 15, 1956 Referring Provider:  Meridee Score, MD  Encounter Date: 12/16/2020  PHYSICAL THERAPY DISCHARGE SUMMARY  Visits from Start of Care: 4  From 10/26/2020 to 11/30/2020  Current functional level related to goals / functional outcomes: See last PT note on 11/30/2020   Remaining deficits: Patient made limited progress due to only attending 4 PT appointments over 5 weeks and not seen in last 2 weeks. Upon chart review, he appears to be experiencing medical issues limiting his ability to attend outpatient PT at this time. Please refer back to PT once he is medically stable enough to attend PT.   Education / Equipment: Prosthetic care & HEP Plan: Patient agrees to discharge.  Patient goals were not met. Patient is being discharged due to not returning since the last visit.  ?????          Jamey Reas, PT, DPT 12/16/2020, 2:04 PM  [facility] [address] [place], [place], [address] Phone: [phone]   Fax:  [phone]

## 2020-12-21 ENCOUNTER — Encounter: Payer: BC Managed Care – PPO | Admitting: Physical Therapy

## 2020-12-23 ENCOUNTER — Other Ambulatory Visit: Payer: Self-pay | Admitting: Surgery

## 2020-12-23 ENCOUNTER — Encounter: Payer: BC Managed Care – PPO | Admitting: Physical Therapy

## 2020-12-23 DIAGNOSIS — Z952 Presence of prosthetic heart valve: Secondary | ICD-10-CM

## 2020-12-24 ENCOUNTER — Institutional Professional Consult (permissible substitution): Payer: BC Managed Care – PPO | Admitting: Surgery

## 2020-12-24 ENCOUNTER — Other Ambulatory Visit: Payer: Self-pay

## 2020-12-24 ENCOUNTER — Encounter: Payer: Self-pay | Admitting: Surgery

## 2020-12-24 ENCOUNTER — Other Ambulatory Visit: Payer: Self-pay | Admitting: Surgery

## 2020-12-24 ENCOUNTER — Ambulatory Visit
Admission: RE | Admit: 2020-12-24 | Discharge: 2020-12-24 | Disposition: A | Discharge status: Home / Self Care | Payer: BC Managed Care – PPO | Source: Ambulatory Visit | Attending: Surgery | Admitting: Surgery

## 2020-12-24 DIAGNOSIS — J90 Pleural effusion, not elsewhere classified: Secondary | ICD-10-CM

## 2020-12-24 DIAGNOSIS — Z952 Presence of prosthetic heart valve: Secondary | ICD-10-CM

## 2020-12-24 NOTE — Progress Notes (Signed)
Cardiothoracic Surgery Consultation  PCP is Isaac Bliss, Rayford Halsted, MD Referring Provider is Eileen Stanford, PA*  Chief Complaint  Patient presents with  . Pleural Effusion    New patient consultation, possible PleurX cath placement, with chest xray    HPI:  The patient is a 65 year old gentleman with a history of hypertension, hyperlipidemia, poorly controlled diabetes and history of heavyNSAIDuse leading to end-stage renal disease. He underwent renal transplantation in 2006 and has been followed by Dr. [name].  He underwent TAVR with a 26 mm sapient 3 valve on 09/08/2020.  Since his procedure he has had problems with exertional shortness of breath and swelling in his right leg.  He had a high-resolution chest CT on 10/21/2020 which showed moderate bilateral pleural effusions with associated atelectasis.  He underwent left thoracentesis on 10/29/2020 removing 1.6 L of yellow fluid.  A follow-up chest x-ray after the procedure did not show any appreciable pleural effusion on the right side but some atelectasis.  He was better but continued to have some shortness of breath and then underwent a right thoracentesis on 12/11/2020 removing 1.5 L of clear yellow fluid.  He said that his breathing immediately improved.  Over the past week or 2 he has had return of some exertional shortness of breath and has been sitting up in a recliner at night sleeping.   Past Medical History:  Diagnosis Date  . Bursitis    left elbow  . Cataract    removed by surgery  . Constipation due to opioid therapy    and decreased ambulation  . Diabetes ([place])    type 2  . 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 Corliss Parish., Kidney Transplant  . GERD (gastroesophageal reflux disease)   . HLD (hyperlipidemia)   . Hyperparathyroidism ([place])   . Hypertension    Patient denies this DX, no meds  . Kidney transplant recipient   . Obesity   . Pneumonia    . 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 Sleep apnea    uses CPAP nightly  . Venous insufficiency   . Vitamin D deficiency     Past Surgical History:  Procedure Laterality Date  . AMPUTATION Right 07/19/2017   Procedure: AMPUTATION RIGHT 2ND TOE;  Surgeon: Leandrew Koyanagi, MD;  Location: [place];  Service: Orthopedics;  Laterality: Right;  . AMPUTATION Left 06/03/2020   Procedure: LEFT BELOW KNEE AMPUTATION;  Surgeon: Newt Minion, MD;  Location: [place];  Service: Orthopedics;  Laterality: Left;  . COLONOSCOPY     greater than 10 yrs ago  . EYE SURGERY Bilateral    cataracts removed  . INTRAOPERATIVE TRANSTHORACIC ECHOCARDIOGRAM N/A 09/08/2020   Procedure: INTRAOPERATIVE TRANSTHORACIC ECHOCARDIOGRAM;  Surgeon: Sherren Mocha, MD;  Location: [place];  Service: Open Heart Surgery;  Laterality: N/A;  . IR THORACENTESIS ASP PLEURAL SPACE W/IMG GUIDE  10/29/2020  . IR THORACENTESIS ASP PLEURAL SPACE W/IMG GUIDE  12/11/2020  . KIDNEY TRANSPLANT  2006  . NEPHRECTOMY TRANSPLANTED ORGAN  2006  . RIGHT HEART CATH AND CORONARY ANGIOGRAPHY N/A 06/08/2020   Procedure: RIGHT HEART CATH AND CORONARY ANGIOGRAPHY;  Surgeon: Nelva Bush, MD;  Location: [place] [facility];  Service: Cardiovascular;  Laterality: N/A;  . TOOTH EXTRACTION N/A 08/06/2020   Procedure: DENTAL EXTRACTIONS;  Surgeon: [name], DMD;  Location: [facility]  OR;  Service: Dentistry;  Laterality: N/A;  . TRANSCATHETER AORTIC VALVE REPLACEMENT, TRANSFEMORAL Left 09/08/2020   Procedure: TRANSCATHETER AORTIC VALVE REPLACEMENT, LEFT TRANSFEMORAL;  Surgeon: Sherren Mocha, MD;  Location: [place];  Service: Open Heart Surgery;  Laterality: Left;  . UPPER GASTROINTESTINAL ENDOSCOPY    . WISDOM TOOTH EXTRACTION      Family History  Problem Relation Age of Onset  . Healthy Mother   . Healthy Father   . Colon cancer Neg Hx   . Rectal cancer  Neg Hx   . Stomach cancer Neg Hx     Social History Social History   Tobacco Use  . Smoking status: Never Smoker  . Smokeless tobacco: Former Systems developer    Types: Secondary school teacher  . Vaping Use: Never used  Substance Use Topics  . Alcohol use: No  . Drug use: No    Current Outpatient Medications  Medication Sig Dispense Refill  . acetaminophen (TYLENOL) 500 MG tablet Take 1,000 mg by mouth every 6 (six) hours as needed for moderate pain.    Marland Kitchen acetaminophen-codeine (TYLENOL #3) 300-30 MG tablet Take 1 tablet by mouth every 8 (eight) hours as needed for moderate pain. 30 tablet 0  . aspirin EC 81 MG EC tablet Take 1 tablet (81 mg total) by mouth daily. Swallow whole. 30 tablet 11  . atorvastatin (LIPITOR) 10 MG tablet Take 1 tablet (10 mg total) by mouth daily.    . clopidogrel (PLAVIX) 75 MG tablet TAKE 1 TABLET (75 MG TOTAL) BY MOUTH DAILY WITH BREAKFAST. 90 tablet 1  . cyclobenzaprine (FLEXERIL) 5 MG tablet Take 1 tablet (5 mg total) by mouth 3 (three) times daily as needed for muscle spasms. 30 tablet 0  . docusate sodium (COLACE) 100 MG capsule Take 1 capsule (100 mg total) by mouth 2 (two) times daily. 60 capsule 0  . fenofibrate (TRICOR) 48 MG tablet Take 1 tablet (48 mg total) by mouth daily.    . furosemide (LASIX) 20 MG tablet Take 1 tablet (20 mg total) by mouth daily. 30 tablet 11  . glyBURIDE (DIABETA) 5 MG tablet Take 1 tablet (5 mg total) by mouth 2 (two) times daily with a meal.    . hydrALAZINE (APRESOLINE) 50 MG tablet Take 1 tablet (50 mg total) by mouth 3 (three) times daily. 270 tablet 3  . insulin aspart (NOVOLOG) 100 UNIT/ML injection Inject 0-30 Units into the skin 3 (three) times daily as needed for high blood sugar. Sliding scale 10 mL 11  . insulin detemir (LEVEMIR) 100 UNIT/ML FlexPen Inject 10 Units into the skin daily. 15 mL 1  . Insulin Pen Needle (BD PEN NEEDLE NANO 2ND GEN) 32G X 4 MM MISC Use daily for glucose control 100 each 3  . Insulin Pen Needle 32G X  4 MM MISC Use as directed with insulin pen 100 each 1  . mycophenolate (MYFORTIC) 360 MG TBEC EC tablet Take 1 tablet (360 mg total) by mouth 2 (two) times daily. 120 tablet   . ONETOUCH VERIO test strip 1 each by Other route 3 (three) times daily. 100 each 12  . pantoprazole (PROTONIX) 40 MG tablet TAKE 1 TABLET (40 MG TOTAL) BY MOUTH DAILY. 90 tablet 1  . polyethylene glycol (MIRALAX / GLYCOLAX) 17 g packet Take 17 g by mouth daily as needed for mild constipation. 14 each 0  . tacrolimus (PROGRAF) 1 MG capsule Take 1 capsule (1 mg total) by mouth 2 (two) times daily.    Marland Kitchen  traMADol (ULTRAM) 50 MG tablet Take 1 tablet (50 mg total) by mouth every 12 (twelve) hours as needed for moderate pain. 30 tablet 0  . traZODone (DESYREL) 50 MG tablet Take 1 tablet (50 mg total) by mouth at bedtime. 30 tablet 0   No current facility-administered medications for this visit.    Allergies  Allergen Reactions  . Sulfa Antibiotics Rash    Review of Systems  Constitutional: Positive for activity change.  Respiratory: Positive for chest tightness and shortness of breath. Negative for cough and wheezing.   Cardiovascular: Positive for leg swelling. Negative for chest pain.    BP (!) 176/83 (BP Location: Left Arm, Patient Position: Sitting, Cuff Size: Large)   Pulse 94   Resp 20   Ht '5\' 11"'$  (1.803 m)   Wt 255 lb 12.8 oz (116 kg) Comment: with prosthesis  SpO2 94% Comment: RA  BMI 35.68 kg/m  Physical Exam Constitutional:      General: He is not in acute distress.    Appearance: He is obese.  Eyes:     Extraocular Movements: Extraocular movements intact.     Pupils: Pupils are equal, round, and reactive to light.  Cardiovascular:     Rate and Rhythm: Normal rate and regular rhythm.     Heart sounds: Normal heart sounds. No murmur heard.   Musculoskeletal:        General: Swelling present.     Comments: Right lower extremity.  He has left BKA.  Neurological:     Mental Status: He is alert.       Diagnostic Tests:  Narrative & Impression  CLINICAL DATA:  Post TAVR  EXAM: CHEST - 2 VIEW  COMPARISON:  12/11/2020  FINDINGS: Enlargement of cardiac silhouette post TAVR.  Mediastinal contours and pulmonary vascularity normal.  Bibasilar pleural effusions and atelectasis.  Upper lungs clear.  No pneumothorax or acute osseous findings.  IMPRESSION: Enlargement of cardiac silhouette post TAVR.  Bibasilar pleural effusions and atelectasis.   Electronically Signed   By: Lavonia Dana M.D.   On: 12/24/2020 14:22    Impression:  Mr. Fatima Sanger has chronic diastolic congestive heart failure with moderate concentric LVH and normal ejection fraction of 60 to 65% with a normally functioning transcatheter valve on echocardiogram.  He has a kidney transplant with a creatinine of 2.4.  He has had recurrent exertional shortness of breath and persistent right lower extremity edema.  Previous CT scan of the chest showed moderate bilateral pleural effusions and he was markedly improved after thoracentesis once on each side draining 1.5 and 1.6 L.  He now has recurrent shortness of breath and difficulty taking deep breaths and feels like the effusions have recurred.  His chest x-ray today is not that remarkable.  There looks like there are small pleural effusions with mild bibasilar atelectasis.  I think that given his body habitus he could certainly hide a significant amount of pleural fluid posteriorly that we would not see on chest x-ray.  I would not recommend placing Pleurx catheters based on his chest x-ray appearance today.  I think the best course of action would be to get a CT scan of the chest without contrast to better evaluate the amount of pleural fluid.  If there are indeed moderate pleural effusions then we could proceed with bilateral Pleurx catheter insertion with expectation that it would significantly improve his breathing.  I think equally as important is better  control of his diastolic congestive heart failure which is made  more difficult by his kidney transplant with a creatinine of 2.4.  He is only on Lasix 20 mg daily and this may need to be adjusted upwards.  If his heart failure is not under better control I would expect his pleural effusions keep forming and any Pleurx catheters will continue to drain.  I discussed all this with the patient and his wife and answered their questions.   Plan:  He will be scheduled for a CT scan of the chest without contrast tomorrow.  I will review that CT scan and then decide about insertion of Pleurx catheters depending on the amount of pleural effusion present.  If there is indeed enough pleural fluid to warrant those catheters then we would probably plan on doing that next Thursday.  He is in agreement with that.  I spent 30 minutes performing this consultation and > 50% of this time was spent face to face counseling and coordinating the care of this patient's recurrent bilateral pleural effusions due to chronic diastolic congestive heart failure.   Gaye Pollack, MD [facility] [phone]

## 2020-12-25 ENCOUNTER — Other Ambulatory Visit: Payer: BC Managed Care – PPO

## 2020-12-29 ENCOUNTER — Ambulatory Visit
Admission: RE | Admit: 2020-12-29 | Discharge: 2020-12-29 | Disposition: A | Discharge status: Home / Self Care | Payer: BC Managed Care – PPO | Source: Ambulatory Visit | Attending: Surgery | Admitting: Surgery

## 2020-12-29 DIAGNOSIS — J90 Pleural effusion, not elsewhere classified: Secondary | ICD-10-CM

## 2020-12-30 ENCOUNTER — Other Ambulatory Visit: Payer: Self-pay | Admitting: *Deleted

## 2020-12-30 DIAGNOSIS — J90 Pleural effusion, not elsewhere classified: Secondary | ICD-10-CM

## 2020-12-30 NOTE — Progress Notes (Addendum)
Surgical Instructions    Your procedure is scheduled on Friday June 3rd.  Report to [facility] Main Entrance "A" at 5:30 A.M., then check in with the Admitting office.  Call this number if you have problems the morning of surgery:  [phone]   If you have any questions prior to your surgery date call [phone]: Open Monday-Friday 8am-4pm    Remember:  Do not eat after midnight the night before your surgery  You may drink clear liquids until 4:30 the morning of your surgery.   Clear liquids allowed are: Water, Non-Citrus Juices (without pulp), Carbonated Beverages, Clear Tea, Black Coffee Only, and Gatorade    Take these medicines the morning of surgery with A SIP OF WATER  atorvastatin (LIPITOR) 10 MG tablet fenofibrate (TRICOR) 48 MG tablet hydrALAZINE (APRESOLINE) 50 MG tablet mycophenolate (MYFORTIC) 360 MG TBEC EC tablet pantoprazole (PROTONIX) 40 MG tablet tacrolimus (PROGRAF) 1 MG capsule  IF NEEDED acetaminophen (TYLENOL) 500 MG tablet  acetaminophen-codeine (TYLENOL #3) 300-30 MG tablet cyclobenzaprine (FLEXERIL) 5 MG tablet traMADol (ULTRAM) 50 MG tablet   As of today, STOP taking any Aspirin (unless otherwise instructed by your surgeon) Aleve, Naproxen, Ibuprofen, Motrin, Advil, Goody's, BC's, all herbal medications, fish oil, and all vitamins.        WHAT DO I DO ABOUT MY DIABETES MEDICATION?   Marland Kitchen Do not take oral diabetes medicines (Glyburide) the morning of surgery. .   . THE DAY BEFORE SURGERY,Do not take Glyburide if taken in evening, morning dose of Glyburide is ok. No bedtime dose of Novolog. Take ___normal_______ units of ___Levemir________insulin that morning. Take 50% of the Levemir dose that evening.        . THE MORNING OF SURGERY, If blood sugar is more than 220 take 1/2 of the usual correction dose.  Take ____50% or half_________ units of __Levemir________insulin.  . The day of surgery, do not take other diabetes injectables, including Byetta  (exenatide), Bydureon (exenatide ER), Victoza (liraglutide), or Trulicity (dulaglutide).  . If your CBG is greater than 220 mg/dL, you may take  of your sliding scale (correction) dose of insulin.   HOW TO MANAGE YOUR DIABETES BEFORE AND AFTER SURGERY  Why is it important to control my blood sugar before and after surgery? . Improving blood sugar levels before and after surgery helps healing and can limit problems. . A way of improving blood sugar control is eating a healthy diet by: o  Eating less sugar and carbohydrates o  Increasing activity/exercise o  Talking with your doctor about reaching your blood sugar goals . High blood sugars (greater than 180 mg/dL) can raise your risk of infections and slow your recovery, so you will need to focus on controlling your diabetes during the weeks before surgery. . Make sure that the doctor who takes care of your diabetes knows about your planned surgery including the date and location.  How do I manage my blood sugar before surgery? . Check your blood sugar at least 4 times a day, starting 2 days before surgery, to make sure that the level is not too high or low.  . Check your blood sugar the morning of your surgery when you wake up and every 2 hours until you get to the Short Stay unit.  o If your blood sugar is less than 70 mg/dL, you will need to treat for low blood sugar: - Do not take insulin. - Treat a low blood sugar (less than 70 mg/dL) with  cup of clear  juice (cranberry or apple), 4 glucose tablets, OR glucose gel. - Recheck blood sugar in 15 minutes after treatment (to make sure it is greater than 70 mg/dL). If your blood sugar is not greater than 70 mg/dL on recheck, call [phone] for further instructions. . Report your blood sugar to the short stay nurse when you get to Short Stay.  . If you are admitted to the hospital after surgery: o Your blood sugar will be checked by the staff and you will probably be given insulin after  surgery (instead of oral diabetes medicines) to make sure you have good blood sugar levels. o The goal for blood sugar control after surgery is 80-180 mg/dL.  Do not wear jewelry Do not wear lotions, powders, colognes, or deodorant. Do not shave 48 hours prior to surgery.  Men may shave face and neck. Do not bring valuables to the hospital. DO Not wear nail polish, gel polish, artificial nails, or any other type of covering on natural nails including finger and toenails. If patients have artificial nails, gel coating, etc. that need to be removed by a nail salon please have this removed prior to surgery or surgery may need to be canceled/delayed if the surgeon/ anesthesia feels like the patient is unable to be adequately monitored.             [facility] is not responsible for any belongings or valuables.  Do NOT Smoke (Tobacco/Vaping) or drink Alcohol 24 hours prior to your procedure If you use a CPAP at night, you may bring all equipment for your overnight stay.   Contacts, glasses, dentures or bridgework may not be worn into surgery, please bring cases for these belongings   For patients admitted to the hospital, discharge time will be determined by your treatment team.   Patients discharged the day of surgery will not be allowed to drive home, and someone needs to stay with them for 24 hours.    Special instructions:    Oral Hygiene is also important to reduce your risk of infection.  Remember - BRUSH YOUR TEETH THE MORNING OF SURGERY WITH YOUR REGULAR TOOTHPASTE   [facility]- Preparing For Surgery  Before surgery, you can play an important role. Because skin is not sterile, your skin needs to be as free of germs as possible. You can reduce the number of germs on your skin by washing with CHG (chlorahexidine gluconate) Soap before surgery.  CHG is an antiseptic cleaner which kills germs and bonds with the skin to continue killing germs even after washing.     Please do not use  if you have an allergy to CHG or antibacterial soaps. If your skin becomes reddened/irritated stop using the CHG.  Do not shave (including legs and underarms) for at least 48 hours prior to first CHG shower. It is OK to shave your face.  Please follow these instructions carefully.    1.  Shower the NIGHT BEFORE SURGERY and the MORNING OF SURGERY with CHG Soap.   If you chose to wash your hair, wash your hair first as usual with your normal shampoo. After you shampoo, rinse your hair and body thoroughly to remove the shampoo.  Then [employer] and genitals (private parts) with your normal soap and rinse thoroughly to remove soap.  2. After that Use CHG Soap as you would any other liquid soap. You can apply CHG directly to the skin and wash gently with a scrungie or a clean washcloth.   3.  Apply the CHG Soap to your body ONLY FROM THE NECK DOWN.  Do not use on open wounds or open sores. Avoid contact with your eyes, ears, mouth and genitals (private parts). Wash Face and genitals (private parts)  with your normal soap.   4. Wash thoroughly, paying special attention to the area where your surgery will be performed.  5. Thoroughly rinse your body with warm water from the neck down.  6. DO NOT shower/wash with your normal soap after using and rinsing off the CHG Soap.  7. Pat yourself dry with a CLEAN TOWEL.  8. Wear CLEAN PAJAMAS to bed the night before surgery  9. Place CLEAN SHEETS on your bed the night before your surgery  10. DO NOT SLEEP WITH PETS.   Day of Surgery:  Take a shower with CHG soap. Wear Clean/Comfortable clothing the morning of surgery Do not apply any deodorants/lotions.   Remember to brush your teeth WITH YOUR REGULAR TOOTHPASTE.   Please read over the following fact sheets that you were given.

## 2020-12-31 ENCOUNTER — Other Ambulatory Visit: Payer: Self-pay

## 2020-12-31 ENCOUNTER — Ambulatory Visit (HOSPITAL_COMMUNITY)
Admission: RE | Admit: 2020-12-31 | Discharge: 2020-12-31 | Disposition: A | Discharge status: Home / Self Care | Payer: BC Managed Care – PPO | Source: Ambulatory Visit | Attending: Surgery | Admitting: Surgery

## 2020-12-31 ENCOUNTER — Other Ambulatory Visit: Payer: Self-pay | Admitting: *Deleted

## 2020-12-31 ENCOUNTER — Encounter (HOSPITAL_COMMUNITY)
Admission: RE | Admit: 2020-12-31 | Discharge: 2020-12-31 | Disposition: A | Discharge status: Home / Self Care | Payer: BC Managed Care – PPO | Source: Ambulatory Visit | Attending: Surgery | Admitting: Surgery

## 2020-12-31 ENCOUNTER — Encounter (HOSPITAL_COMMUNITY): Payer: Self-pay

## 2020-12-31 DIAGNOSIS — Z79899 Other long term (current) drug therapy: Secondary | ICD-10-CM | POA: Insufficient documentation

## 2020-12-31 DIAGNOSIS — Z01818 Encounter for other preprocedural examination: Secondary | ICD-10-CM | POA: Insufficient documentation

## 2020-12-31 DIAGNOSIS — Z794 Long term (current) use of insulin: Secondary | ICD-10-CM | POA: Diagnosis not present

## 2020-12-31 DIAGNOSIS — J90 Pleural effusion, not elsewhere classified: Secondary | ICD-10-CM

## 2020-12-31 DIAGNOSIS — N186 End stage renal disease: Secondary | ICD-10-CM | POA: Insufficient documentation

## 2020-12-31 DIAGNOSIS — E1122 Type 2 diabetes mellitus with diabetic chronic kidney disease: Secondary | ICD-10-CM | POA: Diagnosis not present

## 2020-12-31 DIAGNOSIS — Z882 Allergy status to sulfonamides status: Secondary | ICD-10-CM | POA: Diagnosis not present

## 2020-12-31 DIAGNOSIS — Z992 Dependence on renal dialysis: Secondary | ICD-10-CM | POA: Diagnosis not present

## 2020-12-31 DIAGNOSIS — I132 Hypertensive heart and chronic kidney disease with heart failure and with stage 5 chronic kidney disease, or end stage renal disease: Secondary | ICD-10-CM | POA: Diagnosis not present

## 2020-12-31 DIAGNOSIS — I5032 Chronic diastolic (congestive) heart failure: Secondary | ICD-10-CM | POA: Insufficient documentation

## 2020-12-31 DIAGNOSIS — Z952 Presence of prosthetic heart valve: Secondary | ICD-10-CM | POA: Diagnosis not present

## 2020-12-31 DIAGNOSIS — K219 Gastro-esophageal reflux disease without esophagitis: Secondary | ICD-10-CM | POA: Diagnosis not present

## 2020-12-31 DIAGNOSIS — E213 Hyperparathyroidism, unspecified: Secondary | ICD-10-CM | POA: Diagnosis not present

## 2020-12-31 DIAGNOSIS — Z7982 Long term (current) use of aspirin: Secondary | ICD-10-CM | POA: Diagnosis not present

## 2020-12-31 DIAGNOSIS — Z940 Kidney transplant status: Secondary | ICD-10-CM | POA: Insufficient documentation

## 2020-12-31 DIAGNOSIS — E785 Hyperlipidemia, unspecified: Secondary | ICD-10-CM | POA: Diagnosis not present

## 2020-12-31 DIAGNOSIS — E1121 Type 2 diabetes mellitus with diabetic nephropathy: Secondary | ICD-10-CM | POA: Diagnosis not present

## 2020-12-31 DIAGNOSIS — I872 Venous insufficiency (chronic) (peripheral): Secondary | ICD-10-CM | POA: Diagnosis not present

## 2020-12-31 DIAGNOSIS — I2510 Atherosclerotic heart disease of native coronary artery without angina pectoris: Secondary | ICD-10-CM | POA: Insufficient documentation

## 2020-12-31 DIAGNOSIS — G4733 Obstructive sleep apnea (adult) (pediatric): Secondary | ICD-10-CM | POA: Insufficient documentation

## 2020-12-31 DIAGNOSIS — Z7902 Long term (current) use of antithrombotics/antiplatelets: Secondary | ICD-10-CM | POA: Diagnosis not present

## 2020-12-31 LAB — COMPREHENSIVE METABOLIC PANEL
ALT: 19 U/L (ref 0–44)
AST: 21 U/L (ref 15–41)
Albumin: 2.2 g/dL — ABNORMAL LOW (ref 3.5–5.0)
Alkaline Phosphatase: 113 U/L (ref 38–126)
Anion gap: 13 (ref 5–15)
BUN: 51 mg/dL — ABNORMAL HIGH (ref 8–23)
CO2: 21 mmol/L — ABNORMAL LOW (ref 22–32)
Calcium: 8.2 mg/dL — ABNORMAL LOW (ref 8.9–10.3)
Chloride: 104 mmol/L (ref 98–111)
Creatinine, Ser: 4.34 mg/dL — ABNORMAL HIGH (ref 0.61–1.24)
GFR, Estimated: 15 mL/min — ABNORMAL LOW (ref >60)
Glucose, Bld: 106 mg/dL — ABNORMAL HIGH (ref 70–99)
Potassium: 4 mmol/L (ref 3.5–5.1)
Sodium: 138 mmol/L (ref 135–145)
Total Bilirubin: 0.9 mg/dL (ref 0.3–1.2)
Total Protein: 5.1 g/dL — ABNORMAL LOW (ref 6.5–8.1)

## 2020-12-31 LAB — CBC
HCT: 32.7 % — ABNORMAL LOW (ref 39.0–52.0)
Hemoglobin: 10.2 g/dL — ABNORMAL LOW (ref 13.0–17.0)
MCH: 26.4 pg (ref 26.0–34.0)
MCHC: 31.2 g/dL (ref 30.0–36.0)
MCV: 84.5 fL (ref 80.0–100.0)
Platelets: 78 10*3/uL — ABNORMAL LOW (ref 150–400)
RBC: 3.87 MIL/uL — ABNORMAL LOW (ref 4.22–5.81)
RDW: 18.4 % — ABNORMAL HIGH (ref 11.5–15.5)
WBC: 4.2 10*3/uL (ref 4.0–10.5)
nRBC: 0 % (ref 0.0–0.2)

## 2020-12-31 LAB — SURGICAL PCR SCREEN
MRSA, PCR: NEGATIVE
Staphylococcus aureus: POSITIVE — AB

## 2020-12-31 LAB — PROTIME-INR
INR: 1.3 — ABNORMAL HIGH (ref 0.8–1.2)
Prothrombin Time: 16.3 seconds — ABNORMAL HIGH (ref 11.4–15.2)

## 2020-12-31 LAB — APTT: aPTT: 41 seconds — ABNORMAL HIGH (ref 24–36)

## 2020-12-31 LAB — GLUCOSE, CAPILLARY: Glucose-Capillary: 96 mg/dL (ref 70–99)

## 2020-12-31 NOTE — Progress Notes (Signed)
Surgical Instructions    Your procedure is scheduled on Friday June 3rd.  Report to [facility] Main Entrance "A" at 5:30 A.M., then check in with the Admitting office.  Call this number if you have problems the morning of surgery:  [phone]   If you have any questions prior to your surgery date call [phone]: Open Monday-Friday 8am-4pm    Remember:  Do not eat or drink after midnight the night before your surgery      Take these medicines the morning of surgery with A SIP OF WATER  atorvastatin (LIPITOR) 10 MG tablet fenofibrate (TRICOR) 48 MG tablet hydrALAZINE (APRESOLINE) 50 MG tablet mycophenolate (MYFORTIC) 360 MG TBEC EC tablet pantoprazole (PROTONIX) 40 MG tablet tacrolimus (PROGRAF) 1 MG capsule  IF NEEDED acetaminophen (TYLENOL) 500 MG tablet  acetaminophen-codeine (TYLENOL #3) 300-30 MG tablet cyclobenzaprine (FLEXERIL) 5 MG tablet traMADol (ULTRAM) 50 MG tablet   As of today, STOP taking any Aspirin (unless otherwise instructed by your surgeon) Aleve, Naproxen, Ibuprofen, Motrin, Advil, Goody's, BC's, all herbal medications, fish oil, and all vitamins.        WHAT DO I DO ABOUT MY DIABETES MEDICATION?   Marland Kitchen Do not take oral diabetes medicines the morning of surgery.   glyBURIDE (DIABETA)  . Skip Bedtime dose of insulin aspart (NOVOLOG)  . THE DAY BEFORE SURGERY,  Take ____10______ units of ____insulin detemir (LEVEMIR) insulin that morning  .   Marland Kitchen THE MORNING OF SURGERY  Take ____5_________ units of __insulin detemir (LEVEMIR) __insulin.  . If your CBG is greater than 220 mg/dL, you may take  of your sliding scale (correction) dose of insulin. insulin aspart (NOVOLOG)    HOW TO MANAGE YOUR DIABETES BEFORE AND AFTER SURGERY  Why is it important to control my blood sugar before and after surgery? . Improving blood sugar levels before and after surgery helps healing and can limit problems. . A way of improving blood sugar control is eating a healthy  diet by: o  Eating less sugar and carbohydrates o  Increasing activity/exercise o  Talking with your doctor about reaching your blood sugar goals . High blood sugars (greater than 180 mg/dL) can raise your risk of infections and slow your recovery, so you will need to focus on controlling your diabetes during the weeks before surgery. . Make sure that the doctor who takes care of your diabetes knows about your planned surgery including the date and location.  How do I manage my blood sugar before surgery? . Check your blood sugar at least 4 times a day, starting 2 days before surgery, to make sure that the level is not too high or low.  . Check your blood sugar the morning of your surgery when you wake up and every 2 hours until you get to the Short Stay unit.  o If your blood sugar is less than 70 mg/dL, you will need to treat for low blood sugar: - Do not take insulin. - Treat a low blood sugar (less than 70 mg/dL) with  cup of clear juice (cranberry or apple), 4 glucose tablets, OR glucose gel. - Recheck blood sugar in 15 minutes after treatment (to make sure it is greater than 70 mg/dL). If your blood sugar is not greater than 70 mg/dL on recheck, call [phone] for further instructions. . Report your blood sugar to the short stay nurse when you get to Short Stay.  . If you are admitted to the hospital after surgery: o Your blood sugar  will be checked by the staff and you will probably be given insulin after surgery (instead of oral diabetes medicines) to make sure you have good blood sugar levels. o The goal for blood sugar control after surgery is 80-180 mg/dL.  Do not wear jewelry Do not wear lotions, powders, colognes, or deodorant. Do not shave 48 hours prior to surgery.  Men may shave face and neck. Do not bring valuables to the hospital. DO Not wear nail polish, gel polish, artificial nails, or any other type of covering on natural nails including finger and toenails. If  patients have artificial nails, gel coating, etc. that need to be removed by a nail salon please have this removed prior to surgery or surgery may need to be canceled/delayed if the surgeon/ anesthesia feels like the patient is unable to be adequately monitored.             [facility] is not responsible for any belongings or valuables.  Do NOT Smoke (Tobacco/Vaping) or drink Alcohol 24 hours prior to your procedure If you use a CPAP at night, you may bring all equipment for your overnight stay.   Contacts, glasses, dentures or bridgework may not be worn into surgery, please bring cases for these belongings   For patients admitted to the hospital, discharge time will be determined by your treatment team.   Patients discharged the day of surgery will not be allowed to drive home, and someone needs to stay with them for 24 hours.    Special instructions:    Oral Hygiene is also important to reduce your risk of infection.  Remember - BRUSH YOUR TEETH THE MORNING OF SURGERY WITH YOUR REGULAR TOOTHPASTE   [place]- Preparing For Surgery  Before surgery, you can play an important role. Because skin is not sterile, your skin needs to be as free of germs as possible. You can reduce the number of germs on your skin by washing with CHG (chlorahexidine gluconate) Soap before surgery.  CHG is an antiseptic cleaner which kills germs and bonds with the skin to continue killing germs even after washing.     Please do not use if you have an allergy to CHG or antibacterial soaps. If your skin becomes reddened/irritated stop using the CHG.  Do not shave (including legs and underarms) for at least 48 hours prior to first CHG shower. It is OK to shave your face.  Please follow these instructions carefully.    1.  Shower the NIGHT BEFORE SURGERY and the MORNING OF SURGERY with CHG Soap.   If you chose to wash your hair, wash your hair first as usual with your normal shampoo. After you shampoo, rinse  your hair and body thoroughly to remove the shampoo.  Then [employer] and genitals (private parts) with your normal soap and rinse thoroughly to remove soap.  2. After that Use CHG Soap as you would any other liquid soap. You can apply CHG directly to the skin and wash gently with a scrungie or a clean washcloth.   3. Apply the CHG Soap to your body ONLY FROM THE NECK DOWN.  Do not use on open wounds or open sores. Avoid contact with your eyes, ears, mouth and genitals (private parts). Wash Face and genitals (private parts)  with your normal soap.   4. Wash thoroughly, paying special attention to the area where your surgery will be performed.  5. Thoroughly rinse your body with warm water from the neck down.  6.  DO NOT shower/wash with your normal soap after using and rinsing off the CHG Soap.  7. Pat yourself dry with a CLEAN TOWEL.  8. Wear CLEAN PAJAMAS to bed the night before surgery  9. Place CLEAN SHEETS on your bed the night before your surgery  10. DO NOT SLEEP WITH PETS.   Day of Surgery:  Take a shower with CHG soap. Wear Clean/Comfortable clothing the morning of surgery Do not apply any deodorants/lotions.   Remember to brush your teeth WITH YOUR REGULAR TOOTHPASTE.   Please read over the following fact sheets that you were given.

## 2020-12-31 NOTE — Anesthesia Preprocedure Evaluation (Addendum)
Anesthesia Evaluation  Patient identified by MRN, date of birth, ID band Patient awake    Reviewed: Allergy & Precautions, NPO status , [name] Chart, lab work & pertinent test results  Airway Mallampati: II  TM Distance: >3 FB Neck ROM: Full    Dental  (+) Edentulous Upper, Partial Lower   Pulmonary     + decreased breath sounds      Cardiovascular hypertension,  Rhythm:Regular Rate:Normal     Neuro/Psych    GI/Hepatic   Endo/Other  diabetes  Renal/GU      Musculoskeletal   Abdominal (+) + obese,   Peds  Hematology   Anesthesia Other Findings   Reproductive/Obstetrics                            Anesthesia Physical Anesthesia Plan  ASA: III  Anesthesia Plan: MAC   Post-op Pain Management:    Induction: Intravenous  PONV Risk Score and Plan: Ondansetron and Dexamethasone  Airway Management Planned: Natural Airway and Simple Face Mask  Additional Equipment:   Intra-op Plan:   Post-operative Plan:   Informed Consent: I have reviewed the patients History and Physical, chart, labs and discussed the procedure including the risks, benefits and alternatives for the proposed anesthesia with the patient or authorized representative who has indicated his/her understanding and acceptance.       Plan Discussed with: CRNA and Anesthesiologist  Anesthesia Plan Comments: (See APP note by Durel Salts, FNP )       Anesthesia Quick Evaluation

## 2020-12-31 NOTE — Progress Notes (Addendum)
PCP - Isaac Bliss, Olam Idler MD Cardiologist - Sherren Mocha MD Nephrologist - Dr [name]  Chest x-ray - 12/31/20 EKG - 09/16/20 Stress Test - 10 Years ago ECHO - 10/08/20 Cardiac Cath - 06/08/20  Fasting Blood sugar:75-90 Checks Blood Sugar 4-5 times a day  Wears CPAP at night  Sleep study > 10 years ago   Blood Thinner Instructions: Plavix LD 12/31/20 Aspirin Instructions:LD 12/31/20   COVID TEST- not needed ambulatory posting   Anesthesia review: yes cardiac hx  Patient denies shortness of breath, fever, cough and chest pain at PAT appointment   All instructions explained to the patient, with a verbal understanding of the material. Patient agrees to go over the instructions while at home for a better understanding. Patient also instructed to self quarantine after being tested for COVID-19. The opportunity to ask questions was provided.

## 2020-12-31 NOTE — Progress Notes (Signed)
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 This is higher than pt's reported baseline cr ~ 2 - Platelets 78  I notified Ryan in Dr. Vivi Martens office of concerning lab results.    VS: BP (!) 177/105   Pulse (!) 116   Temp 37.4 C (Oral)   Resp 20   Ht '5\' 11"'$  (1.803 m)   SpO2 (!) 2%   BMI 35.68 kg/m    PROVIDERS: - PCP is Isaac Bliss, Rayford Halsted, MD - Cardiologist is Sherren Mocha, MD - Nephrologist is Corliss Parish, MD   LABS:  - Cr 4.34. This is higher than pt's baseline cr ~ 2.5 - Platelets 78  (all labs ordered are listed, but only abnormal results are displayed)  Labs Reviewed  COMPREHENSIVE METABOLIC PANEL - Abnormal; Notable for the following components:      Result Value   CO2 21 (*)    Glucose, Bld 106 (*)    BUN 51 (*)    Creatinine, Ser 4.34 (*)    Calcium 8.2 (*)    Total Protein 5.1 (*)    Albumin 2.2 (*)    GFR, Estimated 15 (*)    All other components within normal limits  CBC - Abnormal; Notable for the following components:   RBC 3.87 (*)    Hemoglobin 10.2 (*)    HCT 32.7 (*)    RDW 18.4 (*)    Platelets 78 (*)    All other components within normal limits  PROTIME-INR - Abnormal; Notable for the following components:   Prothrombin Time 16.3 (*)    INR 1.3 (*)    All other components within normal limits  APTT - Abnormal; Notable for the following components:   aPTT 41 (*)    All other components within normal limits  SURGICAL PCR SCREEN  GLUCOSE, CAPILLARY   HEMOGLOBIN A1C     IMAGES: CXR 12/31/20: - Mild bibasilar subsegmental atelectasis is noted with small bilateral pleural effusions.  CT chest 12/29/20:  1. Progressive bilateral pleural effusions compared with prior CT of 2 months ago. Associated worsening atelectasis dependently in both lungs. 2. Increased soft tissue edema within the upper abdomen suggesting anasarca. Small amount of ill-defined fluid within the lesser sac, likely related. 3. Coronary and Aortic Atherosclerosis (ICD10-I70.0). Grossly stable postsurgical changes related to previous TAVR.   EKG 09/16/20: SR. LBBB.    CV: Echo 10/08/20:  1. 1 month post TAVR, peak/mean trasnaortic gradients are slightly elevated at 36/18 mmHg, DVI 0.43, trivial paravalvular leak.  2. Left ventricular ejection fraction, by estimation, is 60 to 65%. The left ventricle has normal function. The left ventricle has no regional wall motion abnormalities. There is moderate concentric left ventricular  hypertrophy. Left ventricular diastolic parameters are consistent with Grade II diastolic dysfunction (pseudonormalization). Elevated left atrial pressure.  3. Right ventricular systolic function is normal. The right ventricular size is normal. There is normal pulmonary artery systolic pressure. The  estimated right ventricular systolic pressure is 123456 mmHg.  4.  Left atrial size was moderately dilated.  5. Right atrial size was moderately dilated.  6. The mitral valve is normal in structure. Mild mitral valve regurgitation. Moderate mitral stenosis. The mean mitral valve gradient is 8.0 mmHg with average heart rate of 96 bpm. Severe mitral annular calcification.  7. Tricuspid valve regurgitation is moderate.  8. Trivial paravalvular leak. The aortic valve has been repaired/replaced. Aortic valve regurgitation is not visualized. No aortic  stenosis is present. There is a 26 mm Ultra, stented (TAVR) valve present in the aortic position. Procedure Date:   09/08/2020. Aortic valve mean gradient measures 18.0 mmHg.  9. The inferior vena cava is normal in size with greater than 50% respiratory variability, suggesting right atrial pressure of 3 mmHg.   Cardiac event monitor 09/30/20:  - Review shows normal sinus rhythm with an average heart rate of 80 bpm. There is an underlying bundle branch block present. There are few short ventricular and supraventricular runs, but no sustained arrhythmia. There is no high-grade AV block or pathologic pauses.   CT coronary morphology 07/16/20:  1. Aortic Valve: Tricuspid aortic valve. Severely reduced cusp separation. Severely thickened, severely calcified aortic valve cusps. 2.  AV calcium score: 3748 3. LVOT calcification extending inferiorly from left coronary cusp and protruding into annulus. 4.  Aortic annulus is 460 mm2, suitable for a 26 mm Sapien 3 valve. 5.  Adequate coronary artery height from annulus. 6. Optimum Fluoroscopic Angle for Delivery: LAO 1, CAU 2  Cardiac cath 06/08/20:  1. Mild to moderate, non-obstructive coronary artery disease with 30-40% mid LAD stenosis.  No significant disease identified in the LCx or RCA. 2. Moderately to severely elevated left heart, right heart, and pulmonary artery pressures. 3. Normal cardiac output/index.    Past Medical History:  Diagnosis Date  . Bursitis    left elbow  . Cataract    removed by surgery  . Constipation due to opioid therapy    and decreased ambulation  . Diabetes ([place])    type 2  . 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 Corliss Parish., Kidney Transplant  . GERD (gastroesophageal reflux disease)   . HLD (hyperlipidemia)   . Hyperparathyroidism ([place])   . Hypertension    Patient denies this DX, no meds  . Kidney transplant recipient   . Obesity   . Pneumonia   . 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 Sleep apnea    uses CPAP nightly  . Venous insufficiency   . Vitamin D deficiency     Past Surgical History:  Procedure Laterality Date  . AMPUTATION Right 07/19/2017   Procedure: AMPUTATION RIGHT 2ND TOE;  Surgeon: Leandrew Koyanagi, MD;  Location: [place];  Service: Orthopedics;  Laterality: Right;  . AMPUTATION Left 06/03/2020   Procedure: LEFT BELOW KNEE AMPUTATION;  Surgeon: Newt Minion, MD;  Location: [place];  Service: Orthopedics;  Laterality: Left;  . COLONOSCOPY     greater than 10 yrs ago  . EYE SURGERY Bilateral    cataracts removed  . INTRAOPERATIVE TRANSTHORACIC ECHOCARDIOGRAM N/A 09/08/2020   Procedure: INTRAOPERATIVE TRANSTHORACIC ECHOCARDIOGRAM;  Surgeon: Sherren Mocha, MD;  Location: [place];  Service: Open Heart Surgery;  Laterality: N/A;  . IR THORACENTESIS ASP PLEURAL SPACE W/IMG GUIDE  10/29/2020  . IR THORACENTESIS ASP PLEURAL SPACE W/IMG GUIDE  12/11/2020  .  KIDNEY TRANSPLANT  2006  . NEPHRECTOMY TRANSPLANTED ORGAN  2006  . RIGHT HEART CATH AND CORONARY ANGIOGRAPHY N/A 06/08/2020   Procedure: RIGHT HEART CATH AND CORONARY ANGIOGRAPHY;  Surgeon: Nelva Bush, MD;  Location: [place] [facility];  Service: Cardiovascular;  Laterality: N/A;  . TOOTH EXTRACTION N/A 08/06/2020   Procedure: DENTAL EXTRACTIONS;  Surgeon: [name], DMD;  Location: [place];  Service: Dentistry;  Laterality: N/A;  . TRANSCATHETER AORTIC VALVE REPLACEMENT, TRANSFEMORAL Left 09/08/2020   Procedure: TRANSCATHETER AORTIC VALVE REPLACEMENT, LEFT TRANSFEMORAL;  Surgeon: Sherren Mocha, MD;  Location: [place];  Service: Open Heart Surgery;  Laterality: Left;  . UPPER GASTROINTESTINAL ENDOSCOPY    . WISDOM TOOTH EXTRACTION      MEDICATIONS: . acetaminophen (TYLENOL) 500 MG tablet  . acetaminophen-codeine (TYLENOL #3) 300-30 MG tablet  . aspirin EC 81 MG EC tablet  . atorvastatin (LIPITOR) 10 MG tablet  . clopidogrel (PLAVIX) 75 MG tablet  . cyclobenzaprine (FLEXERIL) 5 MG  tablet  . docusate sodium (COLACE) 100 MG capsule  . fenofibrate (TRICOR) 48 MG tablet  . furosemide (LASIX) 20 MG tablet  . glyBURIDE (DIABETA) 5 MG tablet  . hydrALAZINE (APRESOLINE) 50 MG tablet  . insulin aspart (NOVOLOG) 100 UNIT/ML injection  . insulin detemir (LEVEMIR) 100 UNIT/ML FlexPen  . Insulin Pen Needle (BD PEN NEEDLE NANO 2ND GEN) 32G X 4 MM MISC  . Insulin Pen Needle 32G X 4 MM MISC  . mycophenolate (MYFORTIC) 360 MG TBEC EC tablet  . ONETOUCH VERIO test strip  . pantoprazole (PROTONIX) 40 MG tablet  . polyethylene glycol (MIRALAX / GLYCOLAX) 17 g packet  . tacrolimus (PROGRAF) 1 MG capsule  . traMADol (ULTRAM) 50 MG tablet  . traZODone (DESYREL) 50 MG tablet   No current facility-administered medications for this encounter.    Pt will need further assessment by assigned anesthesiologist day of surgery.   Willeen Cass, PhD, FNP-BC [facility] Stay [facility]/Anesthesiology Phone: [phone] 12/31/2020 2:30 PM

## 2021-01-01 ENCOUNTER — Ambulatory Visit (HOSPITAL_COMMUNITY): Payer: BC Managed Care – PPO

## 2021-01-01 ENCOUNTER — Other Ambulatory Visit: Payer: Self-pay

## 2021-01-01 ENCOUNTER — Ambulatory Visit (HOSPITAL_COMMUNITY): Payer: BC Managed Care – PPO | Admitting: Emergency Medicine

## 2021-01-01 ENCOUNTER — Encounter (HOSPITAL_COMMUNITY): Payer: Self-pay | Admitting: Surgery

## 2021-01-01 ENCOUNTER — Ambulatory Visit (HOSPITAL_COMMUNITY)
Admission: RE | Admit: 2021-01-01 | Discharge: 2021-01-01 | Disposition: A | Discharge status: Home / Self Care | Payer: BC Managed Care – PPO | Source: Ambulatory Visit | Attending: Surgery | Admitting: Surgery

## 2021-01-01 ENCOUNTER — Encounter (HOSPITAL_COMMUNITY)
Admission: RE | Disposition: A | Discharge status: Home / Self Care | Payer: Self-pay | Source: Ambulatory Visit | Attending: Surgery

## 2021-01-01 DIAGNOSIS — N189 Chronic kidney disease, unspecified: Secondary | ICD-10-CM

## 2021-01-01 DIAGNOSIS — K219 Gastro-esophageal reflux disease without esophagitis: Secondary | ICD-10-CM | POA: Insufficient documentation

## 2021-01-01 DIAGNOSIS — I872 Venous insufficiency (chronic) (peripheral): Secondary | ICD-10-CM | POA: Insufficient documentation

## 2021-01-01 DIAGNOSIS — Z7902 Long term (current) use of antithrombotics/antiplatelets: Secondary | ICD-10-CM | POA: Insufficient documentation

## 2021-01-01 DIAGNOSIS — Z992 Dependence on renal dialysis: Secondary | ICD-10-CM | POA: Insufficient documentation

## 2021-01-01 DIAGNOSIS — Z794 Long term (current) use of insulin: Secondary | ICD-10-CM | POA: Insufficient documentation

## 2021-01-01 DIAGNOSIS — E213 Hyperparathyroidism, unspecified: Secondary | ICD-10-CM | POA: Insufficient documentation

## 2021-01-01 DIAGNOSIS — I132 Hypertensive heart and chronic kidney disease with heart failure and with stage 5 chronic kidney disease, or end stage renal disease: Secondary | ICD-10-CM | POA: Insufficient documentation

## 2021-01-01 DIAGNOSIS — Z882 Allergy status to sulfonamides status: Secondary | ICD-10-CM | POA: Insufficient documentation

## 2021-01-01 DIAGNOSIS — I5032 Chronic diastolic (congestive) heart failure: Secondary | ICD-10-CM | POA: Insufficient documentation

## 2021-01-01 DIAGNOSIS — Z79899 Other long term (current) drug therapy: Secondary | ICD-10-CM | POA: Insufficient documentation

## 2021-01-01 DIAGNOSIS — E1121 Type 2 diabetes mellitus with diabetic nephropathy: Secondary | ICD-10-CM | POA: Insufficient documentation

## 2021-01-01 DIAGNOSIS — Z940 Kidney transplant status: Secondary | ICD-10-CM | POA: Diagnosis not present

## 2021-01-01 DIAGNOSIS — Z7982 Long term (current) use of aspirin: Secondary | ICD-10-CM | POA: Insufficient documentation

## 2021-01-01 DIAGNOSIS — J90 Pleural effusion, not elsewhere classified: Secondary | ICD-10-CM | POA: Insufficient documentation

## 2021-01-01 DIAGNOSIS — Z952 Presence of prosthetic heart valve: Secondary | ICD-10-CM | POA: Insufficient documentation

## 2021-01-01 DIAGNOSIS — E785 Hyperlipidemia, unspecified: Secondary | ICD-10-CM | POA: Insufficient documentation

## 2021-01-01 DIAGNOSIS — E1122 Type 2 diabetes mellitus with diabetic chronic kidney disease: Secondary | ICD-10-CM | POA: Insufficient documentation

## 2021-01-01 DIAGNOSIS — N186 End stage renal disease: Secondary | ICD-10-CM | POA: Insufficient documentation

## 2021-01-01 HISTORY — PX: CHEST TUBE INSERTION: SHX231

## 2021-01-01 LAB — CBC
HCT: 28 % — ABNORMAL LOW (ref 39.0–52.0)
Hemoglobin: 8.9 g/dL — ABNORMAL LOW (ref 13.0–17.0)
MCH: 26.5 pg (ref 26.0–34.0)
MCHC: 31.8 g/dL (ref 30.0–36.0)
MCV: 83.3 fL (ref 80.0–100.0)
Platelets: 59 10*3/uL — ABNORMAL LOW (ref 150–400)
RBC: 3.36 MIL/uL — ABNORMAL LOW (ref 4.22–5.81)
RDW: 18.1 % — ABNORMAL HIGH (ref 11.5–15.5)
WBC: 3.3 10*3/uL — ABNORMAL LOW (ref 4.0–10.5)
nRBC: 0 % (ref 0.0–0.2)

## 2021-01-01 LAB — BASIC METABOLIC PANEL
Anion gap: 9 (ref 5–15)
BUN: 54 mg/dL — ABNORMAL HIGH (ref 8–23)
CO2: 20 mmol/L — ABNORMAL LOW (ref 22–32)
Calcium: 7.5 mg/dL — ABNORMAL LOW (ref 8.9–10.3)
Chloride: 107 mmol/L (ref 98–111)
Creatinine, Ser: 4.43 mg/dL — ABNORMAL HIGH (ref 0.61–1.24)
GFR, Estimated: 14 mL/min — ABNORMAL LOW (ref >60)
Glucose, Bld: 34 mg/dL — CL (ref 70–99)
Potassium: 3.3 mmol/L — ABNORMAL LOW (ref 3.5–5.1)
Sodium: 136 mmol/L (ref 135–145)

## 2021-01-01 LAB — HEMOGLOBIN A1C
Hgb A1c MFr Bld: 5.2 % (ref 4.8–5.6)
Mean Plasma Glucose: 103 mg/dL

## 2021-01-01 LAB — GLUCOSE, CAPILLARY
Glucose-Capillary: 108 mg/dL — ABNORMAL HIGH (ref 70–99)
Glucose-Capillary: 31 mg/dL — CL (ref 70–99)
Glucose-Capillary: 49 mg/dL — ABNORMAL LOW (ref 70–99)
Glucose-Capillary: 114 mg/dL — ABNORMAL HIGH (ref 70–99)
Glucose-Capillary: 114 mg/dL — ABNORMAL HIGH (ref 70–99)
Glucose-Capillary: 123 mg/dL — ABNORMAL HIGH (ref 70–99)

## 2021-01-01 SURGERY — INSERTION, PLEURAL DRAINAGE CATHETER
Anesthesia: Monitor Anesthesia Care | Site: Chest | Laterality: Bilateral

## 2021-01-01 MED ORDER — DEXTROSE 50 % IV SOLN
INTRAVENOUS | Status: AC
  Administered 2021-01-01: 50 mL via INTRAVENOUS
  Filled 2021-01-01: qty 50

## 2021-01-01 MED ORDER — DEXTROSE 50 % IV SOLN: 1.0000 | Freq: Once | INTRAVENOUS | Status: AC

## 2021-01-01 MED ORDER — 0.9 % SODIUM CHLORIDE (POUR BTL) OPTIME
TOPICAL | Status: DC | PRN
  Administered 2021-01-01: 1000 mL

## 2021-01-01 MED ORDER — CEFAZOLIN SODIUM-DEXTROSE 2-4 GM/100ML-% IV SOLN
2.0000 g | INTRAVENOUS | Status: AC
  Administered 2021-01-01: 2 g via INTRAVENOUS
  Filled 2021-01-01: qty 100

## 2021-01-01 MED ORDER — ONDANSETRON HCL 4 MG/2ML IJ SOLN: 4.0000 mg | Freq: Once | INTRAMUSCULAR | Status: DC | PRN

## 2021-01-01 MED ORDER — DEXMEDETOMIDINE (PRECEDEX) IN NS 20 MCG/5ML (4 MCG/ML) IV SYRINGE
PREFILLED_SYRINGE | INTRAVENOUS | Status: DC | PRN
  Administered 2021-01-01 (×6): 8 ug via INTRAVENOUS

## 2021-01-01 MED ORDER — FENTANYL CITRATE (PF) 250 MCG/5ML IJ SOLN
INTRAMUSCULAR | Status: AC
  Filled 2021-01-01: qty 5

## 2021-01-01 MED ORDER — DEXTROSE IN LACTATED RINGERS 5 % IV SOLN: INTRAVENOUS | Status: DC | PRN

## 2021-01-01 MED ORDER — LIDOCAINE HCL 1 % IJ SOLN
INTRAMUSCULAR | Status: DC | PRN
  Administered 2021-01-01: 20 mL

## 2021-01-01 MED ORDER — ALBUMIN HUMAN 5 % IV SOLN
12.5000 g | Freq: Once | INTRAVENOUS | Status: AC
  Administered 2021-01-01: 12.5 g via INTRAVENOUS

## 2021-01-01 MED ORDER — MIDAZOLAM HCL 2 MG/2ML IJ SOLN
INTRAMUSCULAR | Status: AC
  Filled 2021-01-01: qty 2

## 2021-01-01 MED ORDER — PHENYLEPHRINE 40 MCG/ML (10ML) SYRINGE FOR IV PUSH (FOR BLOOD PRESSURE SUPPORT)
PREFILLED_SYRINGE | INTRAVENOUS | Status: DC | PRN
  Administered 2021-01-01 (×3): 80 ug via INTRAVENOUS

## 2021-01-01 MED ORDER — FENTANYL CITRATE (PF) 100 MCG/2ML IJ SOLN: 25.0000 ug | INTRAMUSCULAR | Status: DC | PRN

## 2021-01-01 MED ORDER — ALBUMIN HUMAN 5 % IV SOLN
INTRAVENOUS | Status: AC
  Filled 2021-01-01: qty 250

## 2021-01-01 MED ORDER — ORAL CARE MOUTH RINSE: 15.0000 mL | Freq: Once | OROMUCOSAL | Status: AC

## 2021-01-01 MED ORDER — DEXMEDETOMIDINE HCL IN NACL 400 MCG/100ML IV SOLN
INTRAVENOUS | Status: DC | PRN
  Administered 2021-01-01: .5 ug/kg/h via INTRAVENOUS

## 2021-01-01 MED ORDER — OXYCODONE HCL 5 MG PO TABS: 5.0000 mg | ORAL_TABLET | Freq: Once | ORAL | Status: DC | PRN

## 2021-01-01 MED ORDER — DEXMEDETOMIDINE HCL IN NACL 80 MCG/20ML IV SOLN
INTRAVENOUS | Status: AC
  Filled 2021-01-01: qty 20

## 2021-01-01 MED ORDER — LIDOCAINE HCL (PF) 1 % IJ SOLN
INTRAMUSCULAR | Status: AC
  Filled 2021-01-01: qty 30

## 2021-01-01 MED ORDER — CHLORHEXIDINE GLUCONATE 0.12 % MT SOLN
15.0000 mL | Freq: Once | OROMUCOSAL | Status: AC
  Administered 2021-01-01: 15 mL via OROMUCOSAL
  Filled 2021-01-01: qty 15

## 2021-01-01 MED ORDER — PHENYLEPHRINE 40 MCG/ML (10ML) SYRINGE FOR IV PUSH (FOR BLOOD PRESSURE SUPPORT)
PREFILLED_SYRINGE | INTRAVENOUS | Status: AC
  Filled 2021-01-01: qty 10

## 2021-01-01 MED ORDER — PROPOFOL 10 MG/ML IV BOLUS
INTRAVENOUS | Status: AC
  Filled 2021-01-01: qty 20

## 2021-01-01 MED ORDER — OXYCODONE HCL 5 MG/5ML PO SOLN: 5.0000 mg | Freq: Once | ORAL | Status: DC | PRN

## 2021-01-01 SURGICAL SUPPLY — 32 items
ADH SKN CLS APL DERMABOND .7 (GAUZE/BANDAGES/DRESSINGS) ×2
BLADE CLIPPER SURG (BLADE) ×2 IMPLANT
BRUSH SCRUB EZ PLAIN DRY (MISCELLANEOUS) ×2 IMPLANT
CANISTER SUCT 3000ML PPV (MISCELLANEOUS) ×2 IMPLANT
COVER MAYO STAND STRL (DRAPES) ×2 IMPLANT
COVER SURGICAL LIGHT HANDLE (MISCELLANEOUS) ×2 IMPLANT
DERMABOND ADVANCED (GAUZE/BANDAGES/DRESSINGS) ×2
DERMABOND ADVANCED .7 DNX12 (GAUZE/BANDAGES/DRESSINGS) ×2 IMPLANT
DRAPE C-ARM 42X72 X-RAY (DRAPES) ×2 IMPLANT
DRAPE LAPAROSCOPIC ABDOMINAL (DRAPES) ×2 IMPLANT
DRSG TEGADERM 4X4.75 (GAUZE/BANDAGES/DRESSINGS) ×4 IMPLANT
GAUZE SPONGE 4X4 12PLY STRL LF (GAUZE/BANDAGES/DRESSINGS) ×4 IMPLANT
GLOVE SURG MICRO LTX SZ7 (GLOVE) ×2 IMPLANT
GOWN STRL REUS W/ TWL LRG LVL3 (GOWN DISPOSABLE) ×1 IMPLANT
GOWN STRL REUS W/ TWL XL LVL3 (GOWN DISPOSABLE) ×1 IMPLANT
GOWN STRL REUS W/TWL LRG LVL3 (GOWN DISPOSABLE) ×2
GOWN STRL REUS W/TWL XL LVL3 (GOWN DISPOSABLE) ×2
KIT BASIN OR (CUSTOM PROCEDURE TRAY) ×2 IMPLANT
KIT PLEURX DRAIN CATH 1000ML (MISCELLANEOUS) ×6 IMPLANT
KIT PLEURX DRAIN CATH 15.5FR (DRAIN) ×4 IMPLANT
KIT TURNOVER KIT B (KITS) ×2 IMPLANT
NS IRRIG 1000ML POUR BTL (IV SOLUTION) ×2 IMPLANT
PACK GENERAL/GYN (CUSTOM PROCEDURE TRAY) ×2 IMPLANT
PAD ARMBOARD 7.5X6 YLW CONV (MISCELLANEOUS) ×4 IMPLANT
SET DRAINAGE LINE (MISCELLANEOUS) IMPLANT
SUT ETHILON 3 0 PS 1 (SUTURE) ×2 IMPLANT
SUT VIC AB 3-0 X1 27 (SUTURE) ×2 IMPLANT
SYR CONTROL 10ML LL (SYRINGE) ×2 IMPLANT
TOWEL GREEN STERILE (TOWEL DISPOSABLE) ×2 IMPLANT
TOWEL GREEN STERILE FF (TOWEL DISPOSABLE) ×2 IMPLANT
VALVE REPLACEMENT CAP (MISCELLANEOUS) IMPLANT
WATER STERILE IRR 1000ML POUR (IV SOLUTION) ×2 IMPLANT

## 2021-01-01 NOTE — Discharge Planning (Signed)
[place] Latino J. Clydene Laming, RN, BSN, [place] [phone] RNCM spoke with pt at bedside regarding discharge planning for Steven [name]. Offered pt medicare.gov list of home health agencies to choose from.  Pt chose [facility] to render services. Tommi Rumps of [facility] notified. Patient made aware that [facility] will be in contact in 24-48 hours.  DME needs identified at this time include plurex drainage catheter kits.

## 2021-01-01 NOTE — Progress Notes (Addendum)
[name] blood sugar was 31 when checked pre-op. IV started and 1 amp Dextrose 50% given. Recheck blood sugar 15 minutes later was 108. Patient also states he drank one can of Glucerna around 4:00 this morning. Dr. Linna Caprice made aware of blood sugar levels and that patient drank one can of Glucerna at 4 am. Per Dr. Linna Caprice it is ok to proceed.

## 2021-01-01 NOTE — Progress Notes (Signed)
Blood sugar 49 at 0715. Dr. Linna Caprice made aware and verbal order for another ampule of D50 received. Will recheck blood sugar in 15 minutes.

## 2021-01-01 NOTE — Discharge Instructions (Addendum)
You may shower with the waterproof dressings over the PleurX catheters. No baths.

## 2021-01-01 NOTE — Op Note (Signed)
CARDIOTHORACIC SURGERY OPERATIVE NOTE   01/01/2021 [name] [id number]  Surgeon: Gaye Pollack, MD   First Assistant: none   Preoperative Diagnosis: Recurrent bilateral pleural effusions  Postoperative Diagnosis: same   Procedure:   1.Insertion of bilateral PleurX catheters  Anesthesia: MAC with local   Clinical History/Surgical Indication:   The patient is a 65 year old gentleman with chronic kidney disease status post renal transplantation in the past who also has chronic diastolic congestive heart failure and underwent TAVR in March 2022 for severe aortic stenosis postoperatively he has had problems with recurrent bilateral pleural effusions requiring thoracentesis and recently presented with more shortness of breath.  A chest CT showed at least moderate bilateral pleural effusions that had recurred.  I felt the best option for treating this was to insert bilateral Pleurx catheters to allow ongoing outpatient management.  He has had very difficult fluid balance due to his combination of diastolic heart failure and chronic kidney disease.  Preparation:  The patient was seen in the preoperative holding area and the correct patient, correct operation, correct operative sidewere confirmed with the patient after reviewing the medical record and CT scan. Both sides of the chest was signed by me. The consent was signed by me. Preoperative antibiotics were given. The patient was taken back to the operating room and positioned supine on the operating room table. After intravenous sedation by anesthesia the chest was prepped with betadine soap and solution and draped in the usual sterile manner. A surgical time-out was taken and the correct patient,operative side, and operative procedure were confirmed with the nursing and anesthesia staff.   Operative Procedure:   The right chest wall entry site was located on the right lateral chest approximately in the 8th ICS. 1% lidocaine was  infiltrated in the skin and subcutaneous tissue down to the pleural space. Serous fluid was encountered. A small incision was made and the right pleural space was entered with a needle catheter. The needle was removed and the guidewire inserted into the pleural space. Its position was checked with floroscopy. The skin exit site was chosen on the anterior chest wall just above the costal margin and lidocaine was infiltrated here and along a subcutaneous tunnel over to the chest wall entry site. A small incision was made at the skin exit site and the Pleurx catheter was tunneled from the exit site over to the chest wall entry site and positioned with the cuff just inside the exit site. An introducer and sheath were inserted into the pleural space over the guide wire and the introducer and wire removed. The catheter was inserted into the pleural space and the sheath removed. The catheter was connected to vacuum bottle suction and 1.5 liters of yellow serous pleural fluid was removed. The catheter was fixed to the skin at the exit site with a nylon suture and the other incision was closed with a 3-0 vicryl subcuticular suture and Dermabond. The catheter was capped and a dressing applied over the exit site.   The left chest wall entry site was located on the left lateral chest approximately in the 8th ICS. 1% lidocaine was infiltrated in the skin and subcutaneous tissue down to the pleural space. Serous fluid was encountered. A small incision was made and the left pleural space was entered with a needle catheter. The needle was removed and the guidewire inserted into the pleural space. Its position was checked with floroscopy. The skin exit site was chosen on the anterior chest wall just  above the costal margin and lidocaine was infiltrated here and along a subcutaneous tunnel over to the chest wall entry site. A small incision was made at the skin exit site and the Pleurx catheter was tunneled from the exit site over  to the chest wall entry site and positioned with the cuff just inside the exit site. An introducer and sheath were inserted into the pleural space over the guide wire and the introducer and wire removed. The catheter was inserted into the pleural space and the sheath removed. The catheter was connected to vacuum bottle suction and 1.5 liters of yellow serous pleural fluid was removed. The catheter was fixed to the skin at the exit site with a nylon suture and the other incision was closed with a 3-0 vicryl subcuticular suture and Dermabond. The catheter was capped and a dressing applied over the exit site.   The patient tolerated the procedure well with mild coughing. The sponge, needle and instrument counts were correct according to the nurses and the patient was transported to the PACU in stable and satisfactory condition.

## 2021-01-01 NOTE — Interval H&P Note (Signed)
History and Physical Interval Note:  01/01/2021 7:14 AM  Steven [name]  has presented today for surgery, with the diagnosis of BILATERAL PLEURAL EFFUSIONS.  The various methods of treatment have been discussed with the patient and family. After consideration of risks, benefits and other options for treatment, the patient has consented to  Procedure(s): INSERTION PLEURAL DRAINAGE CATHETER (Bilateral) as a surgical intervention.  The patient's history has been reviewed, patient examined, no change in status, stable for surgery.  I have reviewed the patient's chart and labs.  Questions were answered to the patient's satisfaction.     Gaye Pollack

## 2021-01-01 NOTE — Anesthesia Postprocedure Evaluation (Signed)
Anesthesia Post Note  Patient: Steven [name]  Procedure(s) Performed: INSERTION PLEURAL DRAINAGE CATHETER (Bilateral Chest)     Patient location during evaluation: PACU Anesthesia Type: MAC Level of consciousness: awake and alert Pain management: pain level controlled Vital Signs Assessment: post-procedure vital signs reviewed and stable Respiratory status: spontaneous breathing, nonlabored ventilation, respiratory function stable and patient connected to nasal cannula oxygen Cardiovascular status: stable and blood pressure returned to baseline Postop Assessment: no apparent nausea or vomiting Anesthetic complications: no   No complications documented.  Last Vitals:  Vitals:   01/01/21 0920 01/01/21 0935  BP: (!) 104/54 (!) 90/52  Pulse: 71 78  Resp: 16 18  Temp:  (!) 36.1 C  SpO2: 99% 97%    Last Pain:  Vitals:   01/01/21 0935  PainSc: 0-No pain                 Rahim Astorga COKER

## 2021-01-01 NOTE — H&P (Signed)
[address]       [place] [address]             [phone]      Cardiothoracic Surgery History and Physical   PCP is Isaac Bliss, Rayford Halsted, MD Referring Provider is Eileen Stanford, [place]*      Chief Complaint  Patient presents with  . Bilateral Pleural Effusions        HPI:  The patient is a 65 year old gentleman with a history of hypertension, hyperlipidemia, poorly controlled diabetes and history of heavyNSAIDuse leading to end-stage renal disease. He underwent renal transplantation in 2006 and has been followed by Dr. [name].  He underwent TAVR with a 26 mm sapient 3 valve on 09/08/2020.  Since his procedure he has had problems with exertional shortness of breath and swelling in his right leg.  He had a high-resolution chest CT on 10/21/2020 which showed moderate bilateral pleural effusions with associated atelectasis.  He underwent left thoracentesis on 10/29/2020 removing 1.6 L of yellow fluid.  A follow-up chest x-ray after the procedure did not show any appreciable pleural effusion on the right side but some atelectasis.  He was better but continued to have some shortness of breath and then underwent a right thoracentesis on 12/11/2020 removing 1.5 L of clear yellow fluid.  He said that his breathing immediately improved.  Over the past few weeks he has had return of some exertional shortness of breath and has been sitting up in a recliner at night sleeping.       Past Medical History:  Diagnosis Date  . Bursitis    left elbow  . Cataract    removed by surgery  . Constipation due to opioid therapy    and decreased ambulation  . Diabetes ([place])    type 2  . 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 Corliss Parish., Kidney Transplant  . GERD (gastroesophageal reflux disease)   . HLD (hyperlipidemia)   . Hyperparathyroidism ([place])   . Hypertension    Patient denies this  DX, no meds  . Kidney transplant recipient   . Obesity   . Pneumonia   . 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 Sleep apnea    uses CPAP nightly  . Venous insufficiency   . Vitamin D deficiency          Past Surgical History:  Procedure Laterality Date  . AMPUTATION Right 07/19/2017   Procedure: AMPUTATION RIGHT 2ND TOE;  Surgeon: Leandrew Koyanagi, MD;  Location: [place];  Service: Orthopedics;  Laterality: Right;  . AMPUTATION Left 06/03/2020   Procedure: LEFT BELOW KNEE AMPUTATION;  Surgeon: Newt Minion, MD;  Location: [place];  Service: Orthopedics;  Laterality: Left;  . COLONOSCOPY     greater than 10 yrs ago  . EYE SURGERY Bilateral    cataracts removed  . INTRAOPERATIVE TRANSTHORACIC ECHOCARDIOGRAM N/A 09/08/2020   Procedure: INTRAOPERATIVE TRANSTHORACIC ECHOCARDIOGRAM;  Surgeon: Sherren Mocha, MD;  Location: [place];  Service: Open Heart Surgery;  Laterality: N/A;  . IR THORACENTESIS ASP PLEURAL SPACE W/IMG GUIDE  10/29/2020  . IR THORACENTESIS ASP PLEURAL SPACE W/IMG GUIDE  12/11/2020  . KIDNEY TRANSPLANT  2006  . NEPHRECTOMY TRANSPLANTED ORGAN  2006  . RIGHT HEART CATH AND CORONARY ANGIOGRAPHY N/A 06/08/2020   Procedure: RIGHT HEART  CATH AND CORONARY ANGIOGRAPHY;  Surgeon: Nelva Bush, MD;  Location: [place] [facility];  Service: Cardiovascular;  Laterality: N/A;  . TOOTH EXTRACTION N/A 08/06/2020   Procedure: DENTAL EXTRACTIONS;  Surgeon: [name], DMD;  Location: [place];  Service: Dentistry;  Laterality: N/A;  . TRANSCATHETER AORTIC VALVE REPLACEMENT, TRANSFEMORAL Left 09/08/2020   Procedure: TRANSCATHETER AORTIC VALVE REPLACEMENT, LEFT TRANSFEMORAL;  Surgeon: Sherren Mocha, MD;  Location: [place];  Service: Open Heart Surgery;  Laterality: Left;  . UPPER GASTROINTESTINAL ENDOSCOPY    . WISDOM TOOTH EXTRACTION           Family History   Problem Relation Age of Onset  . Healthy Mother   . Healthy Father   . Colon cancer Neg Hx   . Rectal cancer Neg Hx   . Stomach cancer Neg Hx     Social History Social History        Tobacco Use  . Smoking status: Never Smoker  . Smokeless tobacco: Former Systems developer    Types: Secondary school teacher  . Vaping Use: Never used  Substance Use Topics  . Alcohol use: No  . Drug use: No          Current Outpatient Medications  Medication Sig Dispense Refill  . acetaminophen (TYLENOL) 500 MG tablet Take 1,000 mg by mouth every 6 (six) hours as needed for moderate pain.    Marland Kitchen acetaminophen-codeine (TYLENOL #3) 300-30 MG tablet Take 1 tablet by mouth every 8 (eight) hours as needed for moderate pain. 30 tablet 0  . aspirin EC 81 MG EC tablet Take 1 tablet (81 mg total) by mouth daily. Swallow whole. 30 tablet 11  . atorvastatin (LIPITOR) 10 MG tablet Take 1 tablet (10 mg total) by mouth daily.    . clopidogrel (PLAVIX) 75 MG tablet TAKE 1 TABLET (75 MG TOTAL) BY MOUTH DAILY WITH BREAKFAST. 90 tablet 1  . cyclobenzaprine (FLEXERIL) 5 MG tablet Take 1 tablet (5 mg total) by mouth 3 (three) times daily as needed for muscle spasms. 30 tablet 0  . docusate sodium (COLACE) 100 MG capsule Take 1 capsule (100 mg total) by mouth 2 (two) times daily. 60 capsule 0  . fenofibrate (TRICOR) 48 MG tablet Take 1 tablet (48 mg total) by mouth daily.    . furosemide (LASIX) 20 MG tablet Take 1 tablet (20 mg total) by mouth daily. 30 tablet 11  . glyBURIDE (DIABETA) 5 MG tablet Take 1 tablet (5 mg total) by mouth 2 (two) times daily with a meal.    . hydrALAZINE (APRESOLINE) 50 MG tablet Take 1 tablet (50 mg total) by mouth 3 (three) times daily. 270 tablet 3  . insulin aspart (NOVOLOG) 100 UNIT/ML injection Inject 0-30 Units into the skin 3 (three) times daily as needed for high blood sugar. Sliding scale 10 mL 11  . insulin detemir (LEVEMIR) 100 UNIT/ML FlexPen Inject 10 Units into the skin  daily. 15 mL 1  . Insulin Pen Needle (BD PEN NEEDLE NANO 2ND GEN) 32G X 4 MM MISC Use daily for glucose control 100 each 3  . Insulin Pen Needle 32G X 4 MM MISC Use as directed with insulin pen 100 each 1  . mycophenolate (MYFORTIC) 360 MG TBEC EC tablet Take 1 tablet (360 mg total) by mouth 2 (two) times daily. 120 tablet   . ONETOUCH VERIO test strip 1 each by Other route 3 (three) times daily. 100 each 12  . pantoprazole (PROTONIX) 40 MG tablet  TAKE 1 TABLET (40 MG TOTAL) BY MOUTH DAILY. 90 tablet 1  . polyethylene glycol (MIRALAX / GLYCOLAX) 17 g packet Take 17 g by mouth daily as needed for mild constipation. 14 each 0  . tacrolimus (PROGRAF) 1 MG capsule Take 1 capsule (1 mg total) by mouth 2 (two) times daily.    . traMADol (ULTRAM) 50 MG tablet Take 1 tablet (50 mg total) by mouth every 12 (twelve) hours as needed for moderate pain. 30 tablet 0  . traZODone (DESYREL) 50 MG tablet Take 1 tablet (50 mg total) by mouth at bedtime. 30 tablet 0   No current facility-administered medications for this visit.        Allergies  Allergen Reactions  . Sulfa Antibiotics Rash    Review of Systems  Constitutional: Positive for activity change.  Respiratory: Positive for chest tightness and shortness of breath. Negative for cough and wheezing.   Cardiovascular: Positive for leg swelling. Negative for chest pain.    BP (!) 176/83 (BP Location: Left Arm, Patient Position: Sitting, Cuff Size: Large)   Pulse 94   Resp 20   Ht '5\' 11"'$  (1.803 m)   Wt 255 lb 12.8 oz (116 kg) Comment: with prosthesis  SpO2 94% Comment: RA  BMI 35.68 kg/m  Physical Exam Constitutional:      General: He is not in acute distress.    Appearance: He is obese.  Eyes:     Extraocular Movements: Extraocular movements intact.     Pupils: Pupils are equal, round, and reactive to light.  Cardiovascular:     Rate and Rhythm: Normal rate and regular rhythm.     Heart sounds: Normal heart sounds. No murmur  heard.   Musculoskeletal:        General: Swelling present.     Comments: Right lower extremity.  He has left BKA.  Neurological:     Mental Status: He is alert.      Diagnostic Tests:  Narrative & Impression  CLINICAL DATA: Post TAVR  EXAM: CHEST - 2 VIEW  COMPARISON: 12/11/2020  FINDINGS: Enlargement of cardiac silhouette post TAVR.  Mediastinal contours and pulmonary vascularity normal.  Bibasilar pleural effusions and atelectasis.  Upper lungs clear.  No pneumothorax or acute osseous findings.  IMPRESSION: Enlargement of cardiac silhouette post TAVR.  Bibasilar pleural effusions and atelectasis.   Electronically Signed By: Lavonia Dana M.D. On: 12/24/2020 14:22     Narrative & Impression  CLINICAL DATA:  Shortness of breath with pleural effusions and atelectasis for 1.5 weeks status post recent TAVR. Previous bilateral thoracentesis (on the left 10/29/2020 and on the right 12/11/2020).  EXAM: CT CHEST WITHOUT CONTRAST  TECHNIQUE: Multidetector CT imaging of the chest was performed following the standard protocol without IV contrast.  COMPARISON:  Radiographs 12/24/2020 and 12/11/2020.  CT 10/21/2020.  FINDINGS: Cardiovascular: Atherosclerosis of the aorta, great vessels and coronary arteries. Unchanged appearance of the aortic valve stent endograft and mitral annular calcifications. Trace pericardial fluid. The heart size is normal.  Mediastinum/Nodes: There are no enlarged mediastinal, hilar or axillary lymph nodes.Hilar assessment is limited by the lack of intravenous contrast, although the hilar contours appear unchanged. The thyroid gland, trachea and esophagus demonstrate no significant findings.  Lungs/Pleura: There are enlarging moderate-sized bilateral pleural effusions with worsening dependent atelectasis in both lower lobes, the right middle lobe and lingula. Patchy areas of air trapping, most  significant in the left upper lobe, are grossly stable.  Upper abdomen: There is a small amount of  ill-defined fluid within the lesser sac. Diffuse vascular calcifications and mild splenomegaly are noted.  Musculoskeletal/Chest wall: Generalized soft tissue edema in the upper abdomen has mildly progressed. Stable degenerative changes throughout the spine without acute osseous findings.  IMPRESSION: 1. Progressive bilateral pleural effusions compared with prior CT of 2 months ago. Associated worsening atelectasis dependently in both lungs. 2. Increased soft tissue edema within the upper abdomen suggesting anasarca. Small amount of ill-defined fluid within the lesser sac, likely related. 3. Coronary and Aortic Atherosclerosis (ICD10-I70.0). Grossly stable postsurgical changes related to previous TAVR.   Electronically Signed   By: Richardean Sale M.D.   On: 12/29/2020 14:18    Impression:  Mr. Fatima Sanger has chronic diastolic congestive heart failure with moderate concentric LVH and normal ejection fraction of 60 to 65% with a normally functioning transcatheter valve on echocardiogram.  He has a kidney transplant with a creatinine of 2.4.  He has had recurrent exertional shortness of breath and persistent right lower extremity edema.  Previous CT scan of the chest showed moderate bilateral pleural effusions and he was markedly improved after thoracentesis once on each side draining 1.5 and 1.6 L.  He now has recurrent shortness of breath and difficulty taking deep breaths and feels like the effusions have recurred.  Repeat CT chest shows progressively increasing moderate bilateral pleural effusions with bilateral lower lobe compressive atelectasis. I think insertion of bilateral PleurX catheters is indicated to allow complete drainage and reexpansion of his lungs for relief of his shortness of breath. Equally important will be management of his congestive heart failure and fluid  balance.He has chronic diastolic heart failure with an EF of 60-65% on his last echo in March after TAVR. His preop BMET yesterday shows a rise in his creat to 4.3 from his previous level of 2.41 on 11/10/20. This will need to be followed up by nephrology. His repeat is pending today. K+ and CO2 have remained normal. CBC has shown persistent anemia and thrombocytopenia.   Plan:  Insertion of bilateral PleurX catheters.    Gaye Pollack, MD [facility] [phone]

## 2021-01-01 NOTE — Brief Op Note (Signed)
01/01/2021  9:02 AM  PATIENT:  Steven [name]  65 y.o. male  PRE-OPERATIVE DIAGNOSIS:  BILATERAL PLEURAL EFFUSIONS  POST-OPERATIVE DIAGNOSIS:  BILATERAL PLEURAL EFFUSIONS  PROCEDURE:  Procedure(s): INSERTION PLEURAL DRAINAGE CATHETER (Bilateral)  SURGEON:  Surgeon(s) and Role:    * Yonna Alwin, Fernande Boyden, MD - Primary  PHYSICIAN ASSISTANT: none  ASSISTANTS: none  ANESTHESIA:   local and IV sedation  EBL:  none   BLOOD ADMINISTERED:none  DRAINS: bilateral pleurX catheters   LOCAL MEDICATIONS USED:  LIDOCAINE   SPECIMEN:  No Specimen  DISPOSITION OF SPECIMEN:  N/A  COUNTS:  YES  TOURNIQUET:  * No tourniquets in log *  DICTATION: .Note written in EPIC  PLAN OF CARE: Discharge to home after PACU  PATIENT DISPOSITION:  PACU - hemodynamically stable.   Delay start of Pharmacological VTE agent (>24hrs) due to surgical blood loss or risk of bleeding: not applicable

## 2021-01-01 NOTE — Transfer of Care (Signed)
Immediate Anesthesia Transfer of Care Note  Patient: [name]  Procedure(s) Performed: INSERTION PLEURAL DRAINAGE CATHETER (Bilateral Chest)  Patient Location: PACU  Anesthesia Type:MAC  Level of Consciousness: awake, drowsy and patient cooperative  Airway & Oxygen Therapy: Patient Spontanous Breathing and Patient connected to nasal cannula oxygen  Post-op Assessment: Report given to RN, Post -op Vital signs reviewed and stable and Patient moving all extremities X 4  Post vital signs: Reviewed and stable  Last Vitals:  Vitals Value Taken Time  BP 84/45 01/01/21 0847  Temp    Pulse 68 01/01/21 0850  Resp 18 01/01/21 0850  SpO2 100 % 01/01/21 0850  Vitals shown include unvalidated device data.  Last Pain:  Vitals:   01/01/21 0602  PainSc: 0-No pain      Patients Stated Pain Goal: 2 (61/22/44 9753)  Complications: No complications documented.

## 2021-01-02 ENCOUNTER — Encounter (HOSPITAL_COMMUNITY): Payer: Self-pay | Admitting: Surgery

## 2021-01-05 ENCOUNTER — Inpatient Hospital Stay (HOSPITAL_COMMUNITY): Payer: BC Managed Care – PPO

## 2021-01-05 ENCOUNTER — Encounter (HOSPITAL_COMMUNITY): Payer: Self-pay | Admitting: Emergency Medicine

## 2021-01-05 ENCOUNTER — Other Ambulatory Visit: Payer: Self-pay

## 2021-01-05 ENCOUNTER — Emergency Department (HOSPITAL_COMMUNITY): Payer: BC Managed Care – PPO

## 2021-01-05 ENCOUNTER — Inpatient Hospital Stay (HOSPITAL_COMMUNITY)
Admission: EM | Admit: 2021-01-05 | Discharge: 2021-01-14 | DRG: 871 | Disposition: A | Discharge status: Hospice | Payer: BC Managed Care – PPO | Attending: Internal Medicine | Admitting: Internal Medicine

## 2021-01-05 DIAGNOSIS — E872 Acidosis: Secondary | ICD-10-CM | POA: Diagnosis present

## 2021-01-05 DIAGNOSIS — E1122 Type 2 diabetes mellitus with diabetic chronic kidney disease: Secondary | ICD-10-CM | POA: Diagnosis present

## 2021-01-05 DIAGNOSIS — Z6836 Body mass index (BMI) 36.0-36.9, adult: Secondary | ICD-10-CM | POA: Diagnosis not present

## 2021-01-05 DIAGNOSIS — E1151 Type 2 diabetes mellitus with diabetic peripheral angiopathy without gangrene: Secondary | ICD-10-CM | POA: Diagnosis present

## 2021-01-05 DIAGNOSIS — R7881 Bacteremia: Secondary | ICD-10-CM

## 2021-01-05 DIAGNOSIS — J9601 Acute respiratory failure with hypoxia: Secondary | ICD-10-CM | POA: Diagnosis present

## 2021-01-05 DIAGNOSIS — Y830 Surgical operation with transplant of whole organ as the cause of abnormal reaction of the patient, or of later complication, without mention of misadventure at the time of the procedure: Secondary | ICD-10-CM | POA: Diagnosis present

## 2021-01-05 DIAGNOSIS — K761 Chronic passive congestion of liver: Secondary | ICD-10-CM | POA: Diagnosis present

## 2021-01-05 DIAGNOSIS — D61818 Other pancytopenia: Secondary | ICD-10-CM | POA: Diagnosis present

## 2021-01-05 DIAGNOSIS — T402X5A Adverse effect of other opioids, initial encounter: Secondary | ICD-10-CM | POA: Diagnosis present

## 2021-01-05 DIAGNOSIS — L8990 Pressure ulcer of unspecified site, unspecified stage: Secondary | ICD-10-CM | POA: Insufficient documentation

## 2021-01-05 DIAGNOSIS — I509 Heart failure, unspecified: Secondary | ICD-10-CM

## 2021-01-05 DIAGNOSIS — I350 Nonrheumatic aortic (valve) stenosis: Secondary | ICD-10-CM | POA: Diagnosis not present

## 2021-01-05 DIAGNOSIS — R0602 Shortness of breath: Secondary | ICD-10-CM | POA: Diagnosis present

## 2021-01-05 DIAGNOSIS — Z794 Long term (current) use of insulin: Secondary | ICD-10-CM

## 2021-01-05 DIAGNOSIS — E669 Obesity, unspecified: Secondary | ICD-10-CM | POA: Diagnosis present

## 2021-01-05 DIAGNOSIS — N179 Acute kidney failure, unspecified: Secondary | ICD-10-CM | POA: Diagnosis not present

## 2021-01-05 DIAGNOSIS — E860 Dehydration: Secondary | ICD-10-CM | POA: Diagnosis present

## 2021-01-05 DIAGNOSIS — E213 Hyperparathyroidism, unspecified: Secondary | ICD-10-CM | POA: Diagnosis present

## 2021-01-05 DIAGNOSIS — Z20822 Contact with and (suspected) exposure to covid-19: Secondary | ICD-10-CM | POA: Diagnosis present

## 2021-01-05 DIAGNOSIS — Z89512 Acquired absence of left leg below knee: Secondary | ICD-10-CM

## 2021-01-05 DIAGNOSIS — T8619 Other complication of kidney transplant: Secondary | ICD-10-CM | POA: Diagnosis present

## 2021-01-05 DIAGNOSIS — G9341 Metabolic encephalopathy: Secondary | ICD-10-CM | POA: Diagnosis present

## 2021-01-05 DIAGNOSIS — I130 Hypertensive heart and chronic kidney disease with heart failure and stage 1 through stage 4 chronic kidney disease, or unspecified chronic kidney disease: Secondary | ICD-10-CM | POA: Diagnosis present

## 2021-01-05 DIAGNOSIS — I5033 Acute on chronic diastolic (congestive) heart failure: Secondary | ICD-10-CM | POA: Diagnosis present

## 2021-01-05 DIAGNOSIS — Z66 Do not resuscitate: Secondary | ICD-10-CM | POA: Diagnosis not present

## 2021-01-05 DIAGNOSIS — D631 Anemia in chronic kidney disease: Secondary | ICD-10-CM | POA: Diagnosis present

## 2021-01-05 DIAGNOSIS — N19 Unspecified kidney failure: Secondary | ICD-10-CM | POA: Diagnosis not present

## 2021-01-05 DIAGNOSIS — J90 Pleural effusion, not elsewhere classified: Secondary | ICD-10-CM | POA: Diagnosis not present

## 2021-01-05 DIAGNOSIS — Z515 Encounter for palliative care: Secondary | ICD-10-CM | POA: Diagnosis not present

## 2021-01-05 DIAGNOSIS — Z7189 Other specified counseling: Secondary | ICD-10-CM | POA: Diagnosis not present

## 2021-01-05 DIAGNOSIS — E8809 Other disorders of plasma-protein metabolism, not elsewhere classified: Secondary | ICD-10-CM | POA: Diagnosis present

## 2021-01-05 DIAGNOSIS — Z87891 Personal history of nicotine dependence: Secondary | ICD-10-CM

## 2021-01-05 DIAGNOSIS — Z7982 Long term (current) use of aspirin: Secondary | ICD-10-CM

## 2021-01-05 DIAGNOSIS — E871 Hypo-osmolality and hyponatremia: Secondary | ICD-10-CM | POA: Diagnosis present

## 2021-01-05 DIAGNOSIS — I342 Nonrheumatic mitral (valve) stenosis: Secondary | ICD-10-CM | POA: Diagnosis not present

## 2021-01-05 DIAGNOSIS — R188 Other ascites: Secondary | ICD-10-CM | POA: Diagnosis present

## 2021-01-05 DIAGNOSIS — A4181 Sepsis due to Enterococcus: Principal | ICD-10-CM | POA: Diagnosis present

## 2021-01-05 DIAGNOSIS — Z952 Presence of prosthetic heart valve: Secondary | ICD-10-CM | POA: Diagnosis not present

## 2021-01-05 DIAGNOSIS — L89152 Pressure ulcer of sacral region, stage 2: Secondary | ICD-10-CM | POA: Diagnosis present

## 2021-01-05 DIAGNOSIS — D849 Immunodeficiency, unspecified: Secondary | ICD-10-CM | POA: Diagnosis present

## 2021-01-05 DIAGNOSIS — N184 Chronic kidney disease, stage 4 (severe): Secondary | ICD-10-CM | POA: Diagnosis present

## 2021-01-05 DIAGNOSIS — G4733 Obstructive sleep apnea (adult) (pediatric): Secondary | ICD-10-CM | POA: Diagnosis present

## 2021-01-05 DIAGNOSIS — E559 Vitamin D deficiency, unspecified: Secondary | ICD-10-CM | POA: Diagnosis present

## 2021-01-05 DIAGNOSIS — A419 Sepsis, unspecified organism: Secondary | ICD-10-CM

## 2021-01-05 DIAGNOSIS — J810 Acute pulmonary edema: Secondary | ICD-10-CM

## 2021-01-05 DIAGNOSIS — J9811 Atelectasis: Secondary | ICD-10-CM | POA: Diagnosis present

## 2021-01-05 DIAGNOSIS — D509 Iron deficiency anemia, unspecified: Secondary | ICD-10-CM | POA: Diagnosis present

## 2021-01-05 DIAGNOSIS — K219 Gastro-esophageal reflux disease without esophagitis: Secondary | ICD-10-CM | POA: Diagnosis present

## 2021-01-05 DIAGNOSIS — Z79899 Other long term (current) drug therapy: Secondary | ICD-10-CM

## 2021-01-05 DIAGNOSIS — K5903 Drug induced constipation: Secondary | ICD-10-CM | POA: Diagnosis present

## 2021-01-05 DIAGNOSIS — E11649 Type 2 diabetes mellitus with hypoglycemia without coma: Secondary | ICD-10-CM | POA: Diagnosis not present

## 2021-01-05 DIAGNOSIS — Z7984 Long term (current) use of oral hypoglycemic drugs: Secondary | ICD-10-CM

## 2021-01-05 DIAGNOSIS — Z882 Allergy status to sulfonamides status: Secondary | ICD-10-CM

## 2021-01-05 DIAGNOSIS — E875 Hyperkalemia: Secondary | ICD-10-CM | POA: Diagnosis present

## 2021-01-05 DIAGNOSIS — N186 End stage renal disease: Secondary | ICD-10-CM | POA: Diagnosis not present

## 2021-01-05 DIAGNOSIS — Z8701 Personal history of pneumonia (recurrent): Secondary | ICD-10-CM

## 2021-01-05 DIAGNOSIS — I872 Venous insufficiency (chronic) (peripheral): Secondary | ICD-10-CM | POA: Diagnosis present

## 2021-01-05 DIAGNOSIS — E785 Hyperlipidemia, unspecified: Secondary | ICD-10-CM | POA: Diagnosis present

## 2021-01-05 LAB — IRON AND TIBC
Iron: 14 ug/dL — ABNORMAL LOW (ref 45–182)
Saturation Ratios: 9 % — ABNORMAL LOW (ref 17.9–39.5)
TIBC: 155 ug/dL — ABNORMAL LOW (ref 250–450)
UIBC: 141 ug/dL

## 2021-01-05 LAB — COMPREHENSIVE METABOLIC PANEL
ALT: 12 U/L (ref 0–44)
AST: 18 U/L (ref 15–41)
Albumin: 1.8 g/dL — ABNORMAL LOW (ref 3.5–5.0)
Alkaline Phosphatase: 129 U/L — ABNORMAL HIGH (ref 38–126)
Anion gap: 9 (ref 5–15)
BUN: 71 mg/dL — ABNORMAL HIGH (ref 8–23)
CO2: 19 mmol/L — ABNORMAL LOW (ref 22–32)
Calcium: 7.3 mg/dL — ABNORMAL LOW (ref 8.9–10.3)
Chloride: 102 mmol/L (ref 98–111)
Creatinine, Ser: 4.71 mg/dL — ABNORMAL HIGH (ref 0.61–1.24)
GFR, Estimated: 13 mL/min — ABNORMAL LOW (ref >60)
Glucose, Bld: 193 mg/dL — ABNORMAL HIGH (ref 70–99)
Potassium: 4.2 mmol/L (ref 3.5–5.1)
Sodium: 130 mmol/L — ABNORMAL LOW (ref 135–145)
Total Bilirubin: 0.5 mg/dL (ref 0.3–1.2)
Total Protein: 4.6 g/dL — ABNORMAL LOW (ref 6.5–8.1)

## 2021-01-05 LAB — CBC WITH DIFFERENTIAL/PLATELET
Abs Immature Granulocytes: 0.07 10*3/uL (ref 0.00–0.07)
Basophils Absolute: 0 10*3/uL (ref 0.0–0.1)
Basophils Relative: 0 %
Eosinophils Absolute: 0.1 10*3/uL (ref 0.0–0.5)
Eosinophils Relative: 2 %
HCT: 26.7 % — ABNORMAL LOW (ref 39.0–52.0)
Hemoglobin: 8.7 g/dL — ABNORMAL LOW (ref 13.0–17.0)
Immature Granulocytes: 1 %
Lymphocytes Relative: 8 %
Lymphs Abs: 0.4 10*3/uL — ABNORMAL LOW (ref 0.7–4.0)
MCH: 26.4 pg (ref 26.0–34.0)
MCHC: 32.6 g/dL (ref 30.0–36.0)
MCV: 80.9 fL (ref 80.0–100.0)
Monocytes Absolute: 0.4 10*3/uL (ref 0.1–1.0)
Monocytes Relative: 7 %
Neutro Abs: 4 10*3/uL (ref 1.7–7.7)
Neutrophils Relative %: 82 %
Platelets: 105 10*3/uL — ABNORMAL LOW (ref 150–400)
RBC: 3.3 MIL/uL — ABNORMAL LOW (ref 4.22–5.81)
RDW: 17 % — ABNORMAL HIGH (ref 11.5–15.5)
WBC: 4.9 10*3/uL (ref 4.0–10.5)
nRBC: 0 % (ref 0.0–0.2)

## 2021-01-05 LAB — FERRITIN: Ferritin: 405 ng/mL — ABNORMAL HIGH (ref 24–336)

## 2021-01-05 LAB — RESP PANEL BY RT-PCR (FLU A&B, COVID) ARPGX2
Influenza A by PCR: NEGATIVE
Influenza B by PCR: NEGATIVE
SARS Coronavirus 2 by RT PCR: NEGATIVE

## 2021-01-05 LAB — LACTATE DEHYDROGENASE: LDH: 190 U/L (ref 98–192)

## 2021-01-05 LAB — PROTIME-INR
INR: 1.2 (ref 0.8–1.2)
Prothrombin Time: 15.4 seconds — ABNORMAL HIGH (ref 11.4–15.2)

## 2021-01-05 LAB — LACTIC ACID, PLASMA
Lactic Acid, Venous: 1 mmol/L (ref 0.5–1.9)
Lactic Acid, Venous: 0.8 mmol/L (ref 0.5–1.9)

## 2021-01-05 LAB — APTT: aPTT: 43 seconds — ABNORMAL HIGH (ref 24–36)

## 2021-01-05 LAB — CBG MONITORING, ED: Glucose-Capillary: 160 mg/dL — ABNORMAL HIGH (ref 70–99)

## 2021-01-05 MED ORDER — FUROSEMIDE 10 MG/ML IJ SOLN
40.0000 mg | Freq: Two times a day (BID) | INTRAMUSCULAR | Status: DC
  Administered 2021-01-05 – 2021-01-06 (×2): 40 mg via INTRAVENOUS
  Filled 2021-01-05 (×2): qty 4

## 2021-01-05 MED ORDER — INSULIN DETEMIR 100 UNIT/ML ~~LOC~~ SOLN
10.0000 [IU] | Freq: Every day | SUBCUTANEOUS | Status: DC
  Administered 2021-01-06 – 2021-01-08 (×3): 10 [IU] via SUBCUTANEOUS
  Filled 2021-01-05 (×4): qty 0.1

## 2021-01-05 MED ORDER — GLYBURIDE 5 MG PO TABS
5.0000 mg | ORAL_TABLET | Freq: Two times a day (BID) | ORAL | Status: DC
  Administered 2021-01-06 (×2): 5 mg via ORAL
  Filled 2021-01-05 (×3): qty 1

## 2021-01-05 MED ORDER — ATORVASTATIN CALCIUM 10 MG PO TABS
10.0000 mg | ORAL_TABLET | Freq: Every day | ORAL | Status: DC
  Administered 2021-01-06 – 2021-01-14 (×9): 10 mg via ORAL
  Filled 2021-01-05 (×9): qty 1

## 2021-01-05 MED ORDER — DOCUSATE SODIUM 100 MG PO CAPS
100.0000 mg | ORAL_CAPSULE | Freq: Every day | ORAL | Status: DC
  Administered 2021-01-06 – 2021-01-14 (×9): 100 mg via ORAL
  Filled 2021-01-05 (×9): qty 1

## 2021-01-05 MED ORDER — FENOFIBRATE 160 MG PO TABS
160.0000 mg | ORAL_TABLET | Freq: Every day | ORAL | Status: DC
  Administered 2021-01-06 – 2021-01-14 (×9): 160 mg via ORAL
  Filled 2021-01-05 (×9): qty 1

## 2021-01-05 MED ORDER — SODIUM CHLORIDE 0.9% FLUSH: 3.0000 mL | INTRAVENOUS | Status: DC | PRN

## 2021-01-05 MED ORDER — CYCLOBENZAPRINE HCL 5 MG PO TABS
5.0000 mg | ORAL_TABLET | Freq: Three times a day (TID) | ORAL | Status: DC | PRN
  Filled 2021-01-05: qty 1

## 2021-01-05 MED ORDER — ONDANSETRON HCL 4 MG/2ML IJ SOLN
4.0000 mg | Freq: Four times a day (QID) | INTRAMUSCULAR | Status: DC | PRN
  Administered 2021-01-08: 4 mg via INTRAVENOUS
  Filled 2021-01-05: qty 2

## 2021-01-05 MED ORDER — SODIUM CHLORIDE 0.9 % IV SOLN
2.0000 g | Freq: Once | INTRAVENOUS | Status: AC
  Administered 2021-01-05: 2 g via INTRAVENOUS
  Filled 2021-01-05: qty 2

## 2021-01-05 MED ORDER — SODIUM BICARBONATE 650 MG PO TABS
650.0000 mg | ORAL_TABLET | Freq: Two times a day (BID) | ORAL | Status: DC
  Administered 2021-01-05 – 2021-01-10 (×11): 650 mg via ORAL
  Filled 2021-01-05 (×12): qty 1

## 2021-01-05 MED ORDER — LABETALOL HCL 5 MG/ML IV SOLN: 10.0000 mg | INTRAVENOUS | Status: DC | PRN

## 2021-01-05 MED ORDER — FUROSEMIDE 8 MG/ML PO SOLN: 40.0000 mg | Freq: Once | ORAL | Status: DC

## 2021-01-05 MED ORDER — PANTOPRAZOLE SODIUM 40 MG PO TBEC
40.0000 mg | DELAYED_RELEASE_TABLET | Freq: Every day | ORAL | Status: DC
  Administered 2021-01-05 – 2021-01-14 (×10): 40 mg via ORAL
  Filled 2021-01-05 (×10): qty 1

## 2021-01-05 MED ORDER — MYCOPHENOLATE SODIUM 180 MG PO TBEC
360.0000 mg | DELAYED_RELEASE_TABLET | Freq: Two times a day (BID) | ORAL | Status: DC
  Administered 2021-01-05 – 2021-01-07 (×5): 360 mg via ORAL
  Filled 2021-01-05 (×6): qty 2

## 2021-01-05 MED ORDER — HEPARIN SODIUM (PORCINE) 5000 UNIT/ML IJ SOLN: 5000.0000 [IU] | Freq: Three times a day (TID) | INTRAMUSCULAR | Status: DC

## 2021-01-05 MED ORDER — VANCOMYCIN HCL 2000 MG/400ML IV SOLN
2000.0000 mg | Freq: Once | INTRAVENOUS | Status: AC
  Administered 2021-01-05: 2000 mg via INTRAVENOUS
  Filled 2021-01-05: qty 400

## 2021-01-05 MED ORDER — CLOPIDOGREL BISULFATE 75 MG PO TABS
75.0000 mg | ORAL_TABLET | Freq: Every day | ORAL | Status: DC
  Administered 2021-01-05 – 2021-01-14 (×10): 75 mg via ORAL
  Filled 2021-01-05 (×10): qty 1

## 2021-01-05 MED ORDER — DOXYCYCLINE HYCLATE 100 MG PO TABS
100.0000 mg | ORAL_TABLET | Freq: Two times a day (BID) | ORAL | Status: DC
  Administered 2021-01-05 – 2021-01-06 (×2): 100 mg via ORAL
  Filled 2021-01-05 (×2): qty 1

## 2021-01-05 MED ORDER — FUROSEMIDE 20 MG PO TABS
40.0000 mg | ORAL_TABLET | Freq: Once | ORAL | Status: AC
  Administered 2021-01-05: 40 mg via ORAL
  Filled 2021-01-05: qty 2

## 2021-01-05 MED ORDER — CARVEDILOL 3.125 MG PO TABS
3.1250 mg | ORAL_TABLET | Freq: Two times a day (BID) | ORAL | Status: DC
  Administered 2021-01-06 – 2021-01-14 (×13): 3.125 mg via ORAL
  Filled 2021-01-05 (×12): qty 1

## 2021-01-05 MED ORDER — ACETAMINOPHEN 500 MG PO TABS: 1000.0000 mg | ORAL_TABLET | Freq: Four times a day (QID) | ORAL | Status: DC | PRN

## 2021-01-05 MED ORDER — HYDRALAZINE HCL 50 MG PO TABS
50.0000 mg | ORAL_TABLET | Freq: Three times a day (TID) | ORAL | Status: DC
  Administered 2021-01-05 – 2021-01-06 (×2): 50 mg via ORAL
  Filled 2021-01-05: qty 1
  Filled 2021-01-05: qty 2

## 2021-01-05 MED ORDER — TRAMADOL HCL 50 MG PO TABS
50.0000 mg | ORAL_TABLET | Freq: Two times a day (BID) | ORAL | Status: DC | PRN
  Administered 2021-01-07: 50 mg via ORAL
  Filled 2021-01-05: qty 1

## 2021-01-05 MED ORDER — ASPIRIN 81 MG PO CHEW
81.0000 mg | CHEWABLE_TABLET | Freq: Every day | ORAL | Status: DC
  Administered 2021-01-06 – 2021-01-14 (×9): 81 mg via ORAL
  Filled 2021-01-05 (×9): qty 1

## 2021-01-05 MED ORDER — TACROLIMUS 1 MG PO CAPS
1.0000 mg | ORAL_CAPSULE | Freq: Two times a day (BID) | ORAL | Status: DC
  Administered 2021-01-05 – 2021-01-14 (×18): 1 mg via ORAL
  Filled 2021-01-05 (×19): qty 1

## 2021-01-05 MED ORDER — SODIUM CHLORIDE 0.9% FLUSH
3.0000 mL | Freq: Two times a day (BID) | INTRAVENOUS | Status: DC
  Administered 2021-01-05 – 2021-01-14 (×18): 3 mL via INTRAVENOUS

## 2021-01-05 MED ORDER — INSULIN ASPART 100 UNIT/ML IJ SOLN
0.0000 [IU] | Freq: Three times a day (TID) | INTRAMUSCULAR | Status: DC
  Administered 2021-01-06: 3 [IU] via SUBCUTANEOUS
  Administered 2021-01-06 – 2021-01-07 (×3): 2 [IU] via SUBCUTANEOUS
  Administered 2021-01-07 – 2021-01-08 (×3): 1 [IU] via SUBCUTANEOUS

## 2021-01-05 MED ORDER — TRAZODONE HCL 50 MG PO TABS
50.0000 mg | ORAL_TABLET | Freq: Every evening | ORAL | Status: DC | PRN
  Administered 2021-01-07 – 2021-01-10 (×4): 50 mg via ORAL
  Filled 2021-01-05 (×4): qty 1

## 2021-01-05 MED ORDER — SODIUM CHLORIDE 0.9 % IV SOLN
250.0000 mL | INTRAVENOUS | Status: DC | PRN
  Administered 2021-01-06 – 2021-01-09 (×2): 250 mL via INTRAVENOUS

## 2021-01-05 MED ORDER — ALBUMIN HUMAN 25 % IV SOLN
25.0000 g | Freq: Four times a day (QID) | INTRAVENOUS | Status: AC
  Administered 2021-01-05 – 2021-01-08 (×12): 25 g via INTRAVENOUS
  Filled 2021-01-05 (×12): qty 100

## 2021-01-05 MED ORDER — ACETAMINOPHEN 325 MG PO TABS: 650.0000 mg | ORAL_TABLET | ORAL | Status: DC | PRN

## 2021-01-05 NOTE — ED Notes (Signed)
Patient transported to Ultrasound 

## 2021-01-05 NOTE — Progress Notes (Signed)
Pharmacy Antibiotic Note  Steven [name] is a 65 y.o. male admitted on 01/05/2021 with sepsis.  Pharmacy has been consulted for vancomycin and cefepime dosing.  Patient with mild leukocytosis, tachycardic, hemodynamically stable, increased respiratory rate and afebrile, on 2L of oxygen. Most recent Scr elevated to 4.43.  Plan: Vancomycin 2g x1 loading dose, then '1000mg'$  q48 (anticipated AUC 462, Scr used 4.43) Cefepime 2g q24 hr Follow up renal function and clinical status  Height: '5\' 11"'$  (180.3 cm) Weight: 111.1 kg (245 lb) IBW/kg (Calculated) : 75.3  Temp (24hrs), Avg:98.8 F (37.1 C), Min:98.8 F (37.1 C), Max:98.8 F (37.1 C)  Recent Labs  Lab 12/31/20 0847 01/01/21 0531  WBC 4.2 3.3*  CREATININE 4.34* 4.43*    Estimated Creatinine Clearance: 21.6 mL/min (A) (by C-G formula based on SCr of 4.43 mg/dL (H)).    Allergies  Allergen Reactions  . Sulfa Antibiotics Rash    Antimicrobials this admission: Vancomycin 6/7 >> Cefepime 6/7 >>  Dose adjustments this admission: NA  Microbiology results: 6/7 BCx: sent prior to abx  Thank you for allowing pharmacy to be a part of this patient's care.  Norina Buzzard, PharmD PGY1 Pharmacy Resident 01/05/2021 4:39 PM

## 2021-01-05 NOTE — ED Provider Notes (Signed)
I saw and evaluated the patient, reviewed the resident's note and I agree with the findings and plan.  Pertinent History: Is a 65 year old male chronically ill, diabetic, status post amputation of his left lower extremity who also has a history of kidney transplant taking immunosuppression medications appropriately.  He recently had a valve replacement and postoperatively developed bilateral pleural effusions.  Since that time he struggled with shortness of breath and ultimately about 5 days ago had bilateral thoracentesis tubes that were placed.  He is having the fluid drawn off at home and when they came to see him today they sent him to the hospital for couple of reasons.  He was reporting fevers as high as 101 by temporal route, he was also complaining of increasing shortness of breath, increasing edema and generally feeling poorly.  He was found to be tachycardic  Pertinent Exam findings: On my exam this patient is tachycardic to 105 bpm, he is tachypneic, he has decreased lung sounds to the midlung range posteriorly.  His upper lung sounds are clear but he has anasarca developing in his body up to about the xiphoid process.  His right lower extremity is completely edematous at 2+ and pitting.  There is no signs of infection there.  Abdomen is soft and nontender, pulses are regular and tachycardic, mucous membranes are slightly dehydrated.  Mental status is awake alert and able to follow commands appropriately.  I was personally present and directly supervised the following procedures:  Medical evaluation resuscitation diuresis antibiotics  I personally interpreted the EKG as well as the resident and agree with the interpretation on the resident's chart.\  This patient has multiple abnormal findings to include pulmonary edema, there is some perihepatic ascites, pleural effusions, tachycardia is slightly improved, requiring Lasix for fluid overload, discussion with consultants including CT surgery  and hospitalist.  Patient to be admitted to high level of care  .Critical Care Performed by: Noemi Chapel, MD Authorized by: Noemi Chapel, MD   Critical care provider statement:    Critical care time (minutes):  35   Critical care time was exclusive of:  Separately billable procedures and treating other patients and teaching time   Critical care was necessary to treat or prevent imminent or life-threatening deterioration of the following conditions:  Respiratory failure, circulatory failure and sepsis   Critical care was time spent personally by me on the following activities:  Blood draw for specimens, development of treatment plan with patient or surrogate, discussions with consultants, evaluation of patient's response to treatment, examination of patient, obtaining history from patient or surrogate, ordering and performing treatments and interventions, ordering and review of laboratory studies, ordering and review of radiographic studies, pulse oximetry, re-evaluation of patient's condition and review of old charts     Final diagnoses:  Sepsis ([place])  Acute congestive heart failure, unspecified heart failure type ([place])  Acute pulmonary edema ([place])  Pleural effusion      Noemi Chapel, MD 01/06/21 [phone]

## 2021-01-05 NOTE — ED Triage Notes (Signed)
Pt BIB [facility] c/o SOB. Friday last week, pt had drains placed for pulmonary edema, Sunday had fluid drained. Hx heart valve replacement Feb. Denies difficulty breathing, states he feels like he cannot take a deep breath.  EMS VS- BP 182/86, HR 104, RR 20, 95% RA, CBG 138

## 2021-01-05 NOTE — H&P (Signed)
History and Physical    [name] [id number] DOB: 01/20/1956 DOA: 01/05/2021  PCP: Isaac Bliss, Rayford Halsted, MD (Confirm with patient/family/NH records and if not entered, this has to be entered at [facility] point of entry) Patient coming from: Home  I have personally briefly reviewed patient's old medical records in [place]  Chief Complaint: SOB   HPI: Steven [name] is a 65 y.o. 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  Patient underwent bilateral Pleurx insertion last Friday by CT surgery, so far had 1 drainage on Sunday.  After Pleurx procedure, patient started to have increasing shortness of breath over the weekend, a dry cough and low-grade fever 100.0.  For which is been taking Tylenol.  Cough is dry.  Patient also reported is been compliant with fluid restriction 1200 mL/day, denies any diarrhea no urinary problems. ED Course: Blood pressure significantly elevated, systolic in the [id number].  Patient in acute respiratory distress.  CT chest showed pulmonary edema and pleural effusion right> left.  Worsening of kidney function 4.7 compared to 4.3 3 days ago.  ED suspect patient has HCAP and vancomycin and cefepime given.  Review of Systems: As per HPI otherwise 14 point review of systems negative.    Past Medical History:  Diagnosis Date  . Bursitis    left elbow  . Cataract    removed by surgery  . Constipation due to opioid therapy    and decreased ambulation  . Diabetes ([place])    type 2  . Diabetic glomerulosclerosis [facility])    [facility] 03/21/12 by Dr. Corliss Parish., Kidney Transplant  . GERD (gastroesophageal reflux disease)   . HLD (hyperlipidemia)   . Hyperparathyroidism ([place])    . Hypertension    Patient denies this DX, no meds  . Kidney transplant recipient   . Obesity   . Pneumonia   . 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 Sleep apnea    uses CPAP nightly  . Venous insufficiency   . Vitamin D deficiency     Past Surgical History:  Procedure Laterality Date  . AMPUTATION Right 07/19/2017   Procedure: AMPUTATION RIGHT 2ND TOE;  Surgeon: Leandrew Koyanagi, MD;  Location: [place];  Service: Orthopedics;  Laterality: Right;  . AMPUTATION Left 06/03/2020   Procedure: LEFT BELOW KNEE AMPUTATION;  Surgeon: Newt Minion, MD;  Location: [place];  Service: Orthopedics;  Laterality: Left;  . CHEST TUBE INSERTION Bilateral 01/01/2021   Procedure: INSERTION PLEURAL DRAINAGE CATHETER;  Surgeon: Gaye Pollack, MD;  Location: [place];  Service: Thoracic;  Laterality: Bilateral;  . COLONOSCOPY     greater than 10 yrs ago  . EYE SURGERY Bilateral    cataracts removed  . INTRAOPERATIVE TRANSTHORACIC ECHOCARDIOGRAM N/A 09/08/2020   Procedure: INTRAOPERATIVE TRANSTHORACIC ECHOCARDIOGRAM;  Surgeon: Sherren Mocha, MD;  Location: [place];  Service: Open Heart Surgery;  Laterality: N/A;  . IR THORACENTESIS ASP PLEURAL SPACE W/IMG GUIDE  10/29/2020  . IR THORACENTESIS ASP PLEURAL SPACE W/IMG GUIDE  12/11/2020  . KIDNEY TRANSPLANT  2006  . NEPHRECTOMY TRANSPLANTED ORGAN  2006  . RIGHT HEART CATH AND CORONARY ANGIOGRAPHY N/A  06/08/2020   Procedure: RIGHT HEART CATH AND CORONARY ANGIOGRAPHY;  Surgeon: Nelva Bush, MD;  Location: [place] CV LAB;  Service: Cardiovascular;  Laterality: N/A;  . TOOTH EXTRACTION N/A 08/06/2020   Procedure: DENTAL EXTRACTIONS;  Surgeon: [name], DMD;  Location: [place];  Service: Dentistry;  Laterality: N/A;  . TRANSCATHETER AORTIC VALVE REPLACEMENT, TRANSFEMORAL Left 09/08/2020   Procedure: TRANSCATHETER AORTIC VALVE REPLACEMENT, LEFT TRANSFEMORAL;   Surgeon: Sherren Mocha, MD;  Location: [place];  Service: Open Heart Surgery;  Laterality: Left;  . UPPER GASTROINTESTINAL ENDOSCOPY    . WISDOM TOOTH EXTRACTION       reports that he has never smoked. He quit smokeless tobacco use about 2 years ago.  His smokeless tobacco use included chew. He reports that he does not drink alcohol and does not use drugs.  Allergies  Allergen Reactions  . Sulfa Antibiotics Rash    Family History  Problem Relation Age of Onset  . Healthy Mother   . Healthy Father   . Colon cancer Neg Hx   . Rectal cancer Neg Hx   . Stomach cancer Neg Hx      Prior to Admission medications   Medication Sig Start Date End Date Taking? Authorizing Provider  acetaminophen (TYLENOL) 500 MG tablet Take 1,000 mg by mouth every 6 (six) hours as needed for moderate pain.    [provider]  acetaminophen-codeine (TYLENOL #3) 300-30 MG tablet Take 1 tablet by mouth every 8 (eight) hours as needed for moderate pain. Patient not taking: Reported on 12/30/2020 07/29/20   Persons, Bevely Palmer, [place]  aspirin EC 81 MG EC tablet Take 1 tablet (81 mg total) by mouth daily. Swallow whole. 06/24/20   Love, Ivan Anchors, PA-C  atorvastatin (LIPITOR) 10 MG tablet Take 1 tablet (10 mg total) by mouth daily. 06/10/20   Oretha Milch D, MD  clopidogrel (PLAVIX) 75 MG tablet TAKE 1 TABLET (75 MG TOTAL) BY MOUTH DAILY WITH BREAKFAST. Patient taking differently: Take 75 mg by mouth at bedtime. 09/10/20 09/10/21  Eileen Stanford, PA-C  cyclobenzaprine (FLEXERIL) 5 MG tablet Take 1 tablet (5 mg total) by mouth 3 (three) times daily as needed for muscle spasms. 06/23/20   Love, Ivan Anchors, PA-C  docusate sodium (COLACE) 100 MG capsule Take 1 capsule (100 mg total) by mouth 2 (two) times daily. Patient taking differently: Take 100 mg by mouth daily. 06/23/20   Love, Ivan Anchors, PA-C  fenofibrate (TRICOR) 48 MG tablet Take 1 tablet (48 mg total) by mouth daily. 06/10/20   Desiree Hane, MD   furosemide (LASIX) 20 MG tablet Take 1 tablet (20 mg total) by mouth daily. 10/16/20 10/11/21  Eileen Stanford, PA-C  glyBURIDE (DIABETA) 5 MG tablet Take 1 tablet (5 mg total) by mouth 2 (two) times daily with a meal. 06/10/20   Oretha Milch D, MD  hydrALAZINE (APRESOLINE) 50 MG tablet Take 1 tablet (50 mg total) by mouth 3 (three) times daily. 10/05/20   Theora Gianotti, NP  insulin aspart (NOVOLOG) 100 UNIT/ML injection Inject 0-30 Units into the skin 3 (three) times daily as needed for high blood sugar. Sliding scale 06/10/20   Oretha Milch D, MD  insulin detemir (LEVEMIR) 100 UNIT/ML FlexPen Inject 10 Units into the skin daily. 06/10/20   Oretha Milch D, MD  Insulin Pen Needle (BD PEN NEEDLE NANO 2ND GEN) 32G X 4 MM MISC Use daily for glucose control 06/10/20   Desiree Hane, MD  Insulin  Pen Needle 32G X 4 MM MISC Use as directed with insulin pen 06/10/20   Oretha Milch D, MD  mycophenolate (MYFORTIC) 360 MG TBEC EC tablet Take 1 tablet (360 mg total) by mouth 2 (two) times daily. 06/10/20   Desiree Hane, MD  [facility] VERIO test strip 1 each by Other route 3 (three) times daily. 06/10/20   Oretha Milch D, MD  pantoprazole (PROTONIX) 40 MG tablet TAKE 1 TABLET (40 MG TOTAL) BY MOUTH DAILY. Patient taking differently: Take 40 mg by mouth daily. 09/10/20 09/10/21  Eileen Stanford, PA-C  polyethylene glycol (MIRALAX / GLYCOLAX) 17 g packet Take 17 g by mouth daily as needed for mild constipation. Patient not taking: Reported on 12/30/2020 06/10/20   Oretha Milch D, MD  tacrolimus (PROGRAF) 1 MG capsule Take 1 capsule (1 mg total) by mouth 2 (two) times daily. 06/10/20   Desiree Hane, MD  traMADol (ULTRAM) 50 MG tablet Take 1 tablet (50 mg total) by mouth every 12 (twelve) hours as needed for moderate pain. 07/29/20   Persons, Bevely Palmer, PA  traZODone (DESYREL) 50 MG tablet Take 1 tablet (50 mg total) by mouth at bedtime. Patient taking differently: Take 50 mg by  mouth at bedtime as needed for sleep. 06/23/20   Bary Leriche, PA-C    Physical Exam: Vitals:   01/05/21 1700 01/05/21 1715 01/05/21 1745 01/05/21 1800  BP: (!) 167/69 (!) 171/72 (!) 160/69 (!) 175/75  Pulse: (!) 104 99 (!) 107 (!) 110  Resp: (!) 23 (!) 23 (!) 27 (!) 38  Temp:      SpO2: 99% 98% 96% 97%  Weight:      Height:        Constitutional: NAD, calm, comfortable Vitals:   01/05/21 1700 01/05/21 1715 01/05/21 1745 01/05/21 1800  BP: (!) 167/69 (!) 171/72 (!) 160/69 (!) 175/75  Pulse: (!) 104 99 (!) 107 (!) 110  Resp: (!) 23 (!) 23 (!) 27 (!) 38  Temp:      SpO2: 99% 98% 96% 97%  Weight:      Height:       Eyes: PERRL, lids and conjunctivae normal ENMT: Mucous membranes are moist. Posterior pharynx clear of any exudate or lesions.Normal dentition.  Neck: normal, supple, no masses, no thyromegaly Respiratory: Diminished breathing sound on bilateral lower fields, no wheezing, fine crackles to bilateral mid levels.  Increasing respiratory effort, talking in broken sentences. No accessory muscle use.  Cardiovascular: Regular rate and rhythm, systolic murmur on carb-based and apex.  Anasarca to upper abdomen. 2+ pedal pulses. No carotid bruits.  Abdomen: no tenderness, no masses palpated. No hepatosplenomegaly. Bowel sounds positive.  Musculoskeletal: no clubbing / cyanosis. No joint deformity upper and lower extremities. Good ROM, no contractures. Normal muscle tone.  Skin: no rashes, lesions, ulcers. No induration Neurologic: CN 2-12 grossly intact. Sensation intact, DTR normal. Strength 5/5 in all 4.  Psychiatric: Normal judgment and insight. Alert and oriented x 3. Normal mood.     Labs on Admission: I have personally reviewed following labs and imaging studies  CBC: Recent Labs  Lab 12/31/20 0847 01/01/21 0531 01/05/21 1550  WBC 4.2 3.3* 4.9  NEUTROABS  --   --  PENDING  HGB 10.2* 8.9* 8.7*  HCT 32.7* 28.0* 26.7*  MCV 84.5 83.3 80.9  PLT 78* 59* PENDING    Basic Metabolic Panel: Recent Labs  Lab 12/31/20 0847 01/01/21 0531  NA 138 136  K 4.0 3.3*  CL 104  107  CO2 21* 20*  GLUCOSE 106* 34*  BUN 51* 54*  CREATININE 4.34* 4.43*  CALCIUM 8.2* 7.5*   GFR: Estimated Creatinine Clearance: 21.6 mL/min (A) (by C-G formula based on SCr of 4.43 mg/dL (H)). Liver Function Tests: Recent Labs  Lab 12/31/20 0847  AST 21  ALT 19  ALKPHOS 113  BILITOT 0.9  PROT 5.1*  ALBUMIN 2.2*   No results for input(s): LIPASE, AMYLASE in the last 168 hours. No results for input(s): AMMONIA in the last 168 hours. Coagulation Profile: Recent Labs  Lab 12/31/20 0847 01/05/21 1550  INR 1.3* 1.2   Cardiac Enzymes: No results for input(s): CKTOTAL, CKMB, CKMBINDEX, TROPONINI in the last 168 hours. BNP (last 3 results) Recent Labs    10/08/20 1440  PROBNP 4,509*   HbA1C: No results for input(s): HGBA1C in the last 72 hours. CBG: Recent Labs  Lab 01/01/21 0624 01/01/21 0715 01/01/21 0754 01/01/21 0825 01/01/21 0850  GLUCAP 108* 49* 114* 114* 123*   Lipid Profile: No results for input(s): CHOL, HDL, LDLCALC, TRIG, CHOLHDL, LDLDIRECT in the last 72 hours. Thyroid Function Tests: No results for input(s): TSH, T4TOTAL, FREET4, T3FREE, THYROIDAB in the last 72 hours. Anemia Panel: No results for input(s): VITAMINB12, FOLATE, FERRITIN, TIBC, IRON, RETICCTPCT in the last 72 hours. Urine analysis:    Component Value Date/Time   COLORURINE YELLOW 09/04/2020 1411   APPEARANCEUR CLEAR 09/04/2020 1411   LABSPEC 1.012 09/04/2020 1411   PHURINE 5.0 09/04/2020 1411   GLUCOSEU NEGATIVE 09/04/2020 1411   HGBUR SMALL (A) 09/04/2020 1411   BILIRUBINUR NEGATIVE 09/04/2020 1411   KETONESUR NEGATIVE 09/04/2020 1411   PROTEINUR 100 (A) 09/04/2020 1411   UROBILINOGEN 1.0 07/11/2007 1851   NITRITE NEGATIVE 09/04/2020 1411   LEUKOCYTESUR NEGATIVE 09/04/2020 1411    Radiological Exams on Admission: CT CHEST WO CONTRAST  Result Date:  01/05/2021 CLINICAL DATA:  Chest pain or SOB, pleurisy or effusion suspected Pneumonia, effusion or abscess suspected, xray done Recent chest tube placement. EXAM: CT CHEST WITHOUT CONTRAST TECHNIQUE: Multidetector CT imaging of the chest was performed following the standard protocol without IV contrast. COMPARISON:  Radiograph earlier today. Most recent chest CT 12/29/2020 FINDINGS: Cardiovascular: Aortic atherosclerosis and coronary artery calcifications. TAVR. Borderline cardiomegaly. Mitral annulus calcifications. Similar trace pericardial effusion. Mediastinum/Nodes: Scattered mediastinal lymph nodes are not enlarged by size criteria. No obvious hilar adenopathy on this noncontrast exam. No thyroid nodule. No esophageal wall thickening. Lungs/Pleura: Right PleurX catheter in place, tip in the pleural space posteriorly the upper lung zone. Moderate right pleural effusion persists. Left PleurX catheter courses inferiorly with tip at the lung base, not entirely included in the field of view. Small to moderate left pleural effusion which is partially loculated and tracking into the fissure. Size of pleural effusions of diminished from prior CT. Ground-glass opacity in septal thickening consistent with pulmonary edema. Compressive atelectasis adjacent to pleural effusions. No pneumothorax. Upper Abdomen: Small volume perihepatic ascites. Musculoskeletal: No acute osseous abnormalities. Diffuse degenerative change throughout the spine. Prominent body wall edema. IMPRESSION: 1. Right PleurX catheter in place, tip in the pleural space posteriorly the upper lung zone. Moderate right pleural effusion persists. Left PleurX catheter courses inferiorly with tip at the lung base, not entirely included in the field of view. Small to moderate left pleural effusion which courses into the inter lobar fissure. Pleural effusions have diminished in size from CT 1 week ago. 2. Pulmonary edema. Borderline cardiomegaly. 3. Small volume  perihepatic ascites. Prominent body wall edema.  Aortic Atherosclerosis (ICD10-I70.0). Electronically Signed   By: Keith Rake M.D.   On: 01/05/2021 17:53   DG Chest Port 1 View  Result Date: 01/05/2021 CLINICAL DATA:  Shortness of breath, PleurX catheters EXAM: PORTABLE CHEST 1 VIEW COMPARISON:  Portable exam 1655 hours compared to 01/01/2021 FINDINGS: RIGHT PleurX catheter present. LEFT PleurX catheter not definitely visualized. Normal heart size mediastinal contours post TAVR. Bibasilar atelectasis versus infiltrates, increased. No significant pleural effusion, pneumothorax or acute osseous findings. IMPRESSION: Bibasilar atelectasis versus infiltrates increased since previous exam, greater on LEFT. Electronically Signed   By: Lavonia Dana M.D.   On: 01/05/2021 17:03    EKG: Independently reviewed.  Sinus, no acute ST changes.  Assessment/Plan Active Problems:   Acute on chronic diastolic CHF (congestive heart failure) (H[facility])   CHF (congestive heart failure) ([place])  (please populate well all problems here in Problem List. (For example, if patient is on BP meds at home and you resume or decide to hold them, it is a problem that needs to be her. Same for CAD, COPD, HLD and so on)  Acute on chronic diastolic CHF decompensation -Signs of fluid overload, crackles on physical exam and recurrent pleural effusion right> left on CT scan, increase Lasix from 20 mg p.o. twice daily to 40 mg IV twice daily. -Etiology considered related to uncontrolled hypertension, increase Lasix, continue hydralazine, change metoprolol to Coreg, add as needed labetalol with parameter.  Recurrent bilateral pleural effusion -Placed a consult to CT surgery for Pleurx cath draining.  Dry cough -Low suspicious for HCAP/severe pneumonia, given the finding of clearly pulmonary edema/CHF decompensation picture.  De-escalate antibiotics to p.o. doxycycline  CKD stage IV with chronic proteinuria -Consult nephrology about  increase of Lasix dose, monitor daily kidney function.  With on-call nephrology Dr. Johnney Ou, ordered transplant ultrasound and nephrology for follow-up patient denied.  Hypoalbuminemia -We will give 3 days of albumin infusion to boost Lasix effect.  Chronic normocytic anemia -Check iron study, may need EPO injections.  IDDM -Sliding scale and sulfonylurea  Thrombocytopenia -Probably secondary to chronic immunosuppression for kidney transplant -Hold off chemical DVT prophylaxis.  DVT prophylaxis: Foot pumps Code Status: Full code Family Communication: Wife at bedside Disposition Plan: PCU, expect more than 2 midnight hospital stay to treat pulmonary edema/CHF and monitor kidney function.  May need repeated draining of Pleurx. Consults called: CT surgery, nephrology. Admission status: PCU   Lequita Halt MD [facility] Pager [phone]  01/05/2021, 6:33 PM

## 2021-01-05 NOTE — ED Notes (Signed)
Pt SpO2 89% on room air. Pt placed on 2L via [place], SpO2 96% on 2L.

## 2021-01-05 NOTE — Sepsis Progress Note (Signed)
Sepsis protocol being followed by [facility] 

## 2021-01-05 NOTE — ED Provider Notes (Addendum)
[facility]Y DEPARTMENT Provider Note   CSN: [id number] Arrival date & time: 01/05/21  1519     History Chief Complaint  Patient presents with  . Shortness of Breath    JORDAN CRISMON is a 65 y.o. male with a past medical history listed below who presents for increasing shortness of breath. Patient recently underwent TAVR in February and had progressive shortness of breath. He underwent insertion of bilateral PleurX catheter on 01/01/2021 for recurrent pleural effusions. He has been having fluid drawn off at home since then. When nurse came by today for fluid drainage was concerned about worsening shortness of breath, tachycardia, and elevated BPs. Dyspnea is associated with dry cough and worse with laying flat.  Patient also endorsing intermittent fevers of 102 as well worsening RLE swelling, decreased urination, poor appetite, and weakness. Mother in room reports worsening renal function since left lower extremity amputation in November. He denies chest pain, palpitations, abdominal pain, nausea, vomiting, abdominal pain, dysuria, numbness, tingling, dizziness, or syncope.    Past Medical History:  Diagnosis Date  . Bursitis    left elbow  . Cataract    removed by surgery  . Constipation due to opioid therapy    and decreased ambulation  . Diabetes ([place])    type 2  . 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 Corliss Parish., Kidney Transplant  . GERD (gastroesophageal reflux disease)   . HLD (hyperlipidemia)   . Hyperparathyroidism ([place])   . Hypertension    Patient denies this DX, no meds  . Kidney transplant recipient   . Obesity   . Pneumonia   . 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 Sleep apnea    uses CPAP nightly  . Venous insufficiency   . Vitamin D deficiency     Patient Active Problem List    Diagnosis Date Noted  . Pleural effusion 12/24/2020  . 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 Acquired absence of left leg below knee (HCC)   . Severe aortic stenosis   . CKD (chronic kidney disease), stage III ([place]) 06/01/2020  . Chronic pain 02/28/2020  . Vitamin D deficiency 02/25/2020  . Thrombocytopenia ([place]) 02/25/2020  . Normocytic anemia 02/25/2020  . Diabetic polyneuropathy associated with type 2 diabetes mellitus ([place])   . Venous insufficiency   . Obesity, Class III, BMI 40-49.9 (morbid obesity) ([place])   . Kidney transplant recipient 05/24/2015  . Diabetes mellitus ([place]) 05/10/2012  . Hyperlipidemia 05/10/2012  . Essential hypertension 05/10/2012    Past Surgical History:  Procedure Laterality Date  . AMPUTATION Right 07/19/2017   Procedure: AMPUTATION RIGHT 2ND TOE;  Surgeon: Leandrew Koyanagi, MD;  Location: [place];  Service: Orthopedics;  Laterality: Right;  . AMPUTATION Left 06/03/2020   Procedure: LEFT BELOW KNEE AMPUTATION;  Surgeon: Newt Minion, MD;  Location: [place];  Service: Orthopedics;  Laterality: Left;  . CHEST TUBE INSERTION Bilateral 01/01/2021   Procedure: INSERTION PLEURAL DRAINAGE CATHETER;  Surgeon: Gaye Pollack, MD;  Location: [place];  Service: Thoracic;  Laterality: Bilateral;  . COLONOSCOPY     greater than 10 yrs ago  . EYE SURGERY Bilateral    cataracts removed  . INTRAOPERATIVE TRANSTHORACIC ECHOCARDIOGRAM N/A 09/08/2020   Procedure: INTRAOPERATIVE TRANSTHORACIC ECHOCARDIOGRAM;  Surgeon: Sherren Mocha, MD;  Location: [place];  Service:  Open Heart Surgery;  Laterality: N/A;  . IR THORACENTESIS ASP PLEURAL SPACE W/IMG GUIDE  10/29/2020  . IR THORACENTESIS ASP PLEURAL SPACE W/IMG GUIDE  12/11/2020  . KIDNEY TRANSPLANT  2006  . NEPHRECTOMY TRANSPLANTED ORGAN  2006  . RIGHT HEART CATH AND CORONARY ANGIOGRAPHY N/A 06/08/2020   Procedure: RIGHT HEART CATH AND CORONARY ANGIOGRAPHY;  Surgeon: Nelva Bush, MD;  Location: [place]  CV LAB;  Service: Cardiovascular;  Laterality: N/A;  . TOOTH EXTRACTION N/A 08/06/2020   Procedure: DENTAL EXTRACTIONS;  Surgeon: [name], DMD;  Location: [place];  Service: Dentistry;  Laterality: N/A;  . TRANSCATHETER AORTIC VALVE REPLACEMENT, TRANSFEMORAL Left 09/08/2020   Procedure: TRANSCATHETER AORTIC VALVE REPLACEMENT, LEFT TRANSFEMORAL;  Surgeon: Sherren Mocha, MD;  Location: [place];  Service: Open Heart Surgery;  Laterality: Left;  . UPPER GASTROINTESTINAL ENDOSCOPY    . WISDOM TOOTH EXTRACTION       Family History  Problem Relation Age of Onset  . Healthy Mother   . Healthy Father   . Colon cancer Neg Hx   . Rectal cancer Neg Hx   . Stomach cancer Neg Hx     Social History   Tobacco Use  . Smoking status: Never Smoker  . Smokeless tobacco: Former Systems developer    Types: Secondary school teacher  . Vaping Use: Never used  Substance Use Topics  . Alcohol use: No  . Drug use: No    Home Medications Prior to Admission medications   Medication Sig Start Date End Date Taking? Authorizing Provider  acetaminophen (TYLENOL) 500 MG tablet Take 1,000 mg by mouth every 6 (six) hours as needed for moderate pain.    [provider]  acetaminophen-codeine (TYLENOL #3) 300-30 MG tablet Take 1 tablet by mouth every 8 (eight) hours as needed for moderate pain. Patient not taking: Reported on 12/30/2020 07/29/20   Persons, Bevely Palmer, [place]  aspirin EC 81 MG EC tablet Take 1 tablet (81 mg total) by mouth daily. Swallow whole. 06/24/20   Love, Ivan Anchors, PA-C  atorvastatin (LIPITOR) 10 MG tablet Take 1 tablet (10 mg total) by mouth daily. 06/10/20   Oretha Milch D, MD  clopidogrel (PLAVIX) 75 MG tablet TAKE 1 TABLET (75 MG TOTAL) BY MOUTH DAILY WITH BREAKFAST. Patient taking differently: Take 75 mg by mouth at bedtime. 09/10/20 09/10/21  Eileen Stanford, PA-C  cyclobenzaprine (FLEXERIL) 5 MG tablet Take 1 tablet (5 mg total) by mouth 3 (three) times daily as needed for muscle spasms.  06/23/20   Love, Ivan Anchors, PA-C  docusate sodium (COLACE) 100 MG capsule Take 1 capsule (100 mg total) by mouth 2 (two) times daily. Patient taking differently: Take 100 mg by mouth daily. 06/23/20   Love, Ivan Anchors, PA-C  fenofibrate (TRICOR) 48 MG tablet Take 1 tablet (48 mg total) by mouth daily. 06/10/20   Desiree Hane, MD  furosemide (LASIX) 20 MG tablet Take 1 tablet (20 mg total) by mouth daily. 10/16/20 10/11/21  Eileen Stanford, PA-C  glyBURIDE (DIABETA) 5 MG tablet Take 1 tablet (5 mg total) by mouth 2 (two) times daily with a meal. 06/10/20   Oretha Milch D, MD  hydrALAZINE (APRESOLINE) 50 MG tablet Take 1 tablet (50 mg total) by mouth 3 (three) times daily. 10/05/20   Theora Gianotti, NP  insulin aspart (NOVOLOG) 100 UNIT/ML injection Inject 0-30 Units into the skin 3 (three) times daily as needed for high blood sugar. Sliding scale 06/10/20   Oretha Milch  D, MD  insulin detemir (LEVEMIR) 100 UNIT/ML FlexPen Inject 10 Units into the skin daily. 06/10/20   Oretha Milch D, MD  Insulin Pen Needle (BD PEN NEEDLE NANO 2ND GEN) 32G X 4 MM MISC Use daily for glucose control 06/10/20   Oretha Milch D, MD  Insulin Pen Needle 32G X 4 MM MISC Use as directed with insulin pen 06/10/20   Oretha Milch D, MD  mycophenolate (MYFORTIC) 360 MG TBEC EC tablet Take 1 tablet (360 mg total) by mouth 2 (two) times daily. 06/10/20   Desiree Hane, MD  [facility] VERIO test strip 1 each by Other route 3 (three) times daily. 06/10/20   Oretha Milch D, MD  pantoprazole (PROTONIX) 40 MG tablet TAKE 1 TABLET (40 MG TOTAL) BY MOUTH DAILY. Patient taking differently: Take 40 mg by mouth daily. 09/10/20 09/10/21  Eileen Stanford, PA-C  polyethylene glycol (MIRALAX / GLYCOLAX) 17 g packet Take 17 g by mouth daily as needed for mild constipation. Patient not taking: Reported on 12/30/2020 06/10/20   Oretha Milch D, MD  tacrolimus (PROGRAF) 1 MG capsule Take 1 capsule (1 mg total) by mouth 2  (two) times daily. 06/10/20   Desiree Hane, MD  traMADol (ULTRAM) 50 MG tablet Take 1 tablet (50 mg total) by mouth every 12 (twelve) hours as needed for moderate pain. 07/29/20   Persons, Bevely Palmer, PA  traZODone (DESYREL) 50 MG tablet Take 1 tablet (50 mg total) by mouth at bedtime. Patient taking differently: Take 50 mg by mouth at bedtime as needed for sleep. 06/23/20   Love, Ivan Anchors, PA-C    Allergies    Sulfa antibiotics  Review of Systems   Review of Systems  Constitutional: Positive for appetite change and fever. Negative for diaphoresis.  HENT: Negative for congestion and sore throat.   Eyes: Negative for photophobia and discharge.  Respiratory: Positive for cough and shortness of breath.   Cardiovascular: Negative for chest pain and palpitations.  Gastrointestinal: Positive for abdominal distention. Negative for blood in stool, constipation, diarrhea, nausea and vomiting.  Endocrine: Negative for polydipsia and polyphagia.  Genitourinary: Positive for decreased urine volume. Negative for dysuria, flank pain and hematuria.  Musculoskeletal: Negative for arthralgias and back pain.  Neurological: Negative for dizziness, syncope and numbness.  Psychiatric/Behavioral: Negative for agitation and confusion.    Physical Exam Updated Vital Signs BP (!) 163/65   Pulse (!) 105   Temp 98.8 F (37.1 C)   Resp (!) 27   Ht '5\' 11"'$  (1.803 m)   Wt 111.1 kg   SpO2 (!) 88%   BMI 34.17 kg/m   Physical Exam Vitals reviewed.  Constitutional:      Appearance: He is obese. He is ill-appearing.  HENT:     Head: Normocephalic and atraumatic.  Eyes:     Extraocular Movements: Extraocular movements intact.     Pupils: Pupils are equal, round, and reactive to light.  Cardiovascular:     Rate and Rhythm: Regular rhythm. Tachycardia present.     Heart sounds: No murmur heard.   Pulmonary:     Effort: Tachypnea present.     Breath sounds: Examination of the right-lower field  reveals rales. Examination of the left-lower field reveals rales. Decreased breath sounds and rales present.  Abdominal:     General: Bowel sounds are normal.     Palpations: Abdomen is soft.     Comments: Anasarca  Musculoskeletal:     Cervical back: Normal range of motion  and neck supple.     Right lower leg: Edema (3+, pitting) present.     Comments: Left BKA  Skin:    General: Skin is warm.     Capillary Refill: Capillary refill takes less than 2 seconds.  Neurological:     General: No focal deficit present.     Mental Status: He is alert and oriented to person, place, and time.  Psychiatric:        Mood and Affect: Mood normal.        Behavior: Behavior normal.    ED Results / Procedures / Treatments   Labs (all labs ordered are listed, but only abnormal results are displayed) Labs Reviewed  CULTURE, BLOOD (ROUTINE X 2)  CULTURE, BLOOD (ROUTINE X 2)  BODY FLUID CULTURE W GRAM STAIN  CBC WITH DIFFERENTIAL/PLATELET  COMPREHENSIVE METABOLIC PANEL  LACTIC ACID, PLASMA  LACTIC ACID, PLASMA  PROTIME-INR  APTT  LACTATE DEHYDROGENASE  LACTATE DEHYDROGENASE, PLEURAL OR PERITONEAL FLUID  PROTEIN, PLEURAL OR PERITONEAL FLUID    EKG EKG Interpretation  Date/Time:  Tuesday January 05 2021 15:27:46 EDT Ventricular Rate:  104 PR Interval:  158 QRS Duration: 108 QT Interval:  354 QTC Calculation: 466 R Axis:   6 Text Interpretation: Sinus tachycardia with irregular rate Low voltage, extremity leads Anteroseptal infarct, old Confirmed by Noemi Chapel [phone]) on 01/05/2021 3:46:12 PM   Radiology DG Chest Port 1 View  Result Date: 01/05/2021 CLINICAL DATA:  Shortness of breath, PleurX catheters EXAM: PORTABLE CHEST 1 VIEW COMPARISON:  Portable exam 1655 hours compared to 01/01/2021 FINDINGS: RIGHT PleurX catheter present. LEFT PleurX catheter not definitely visualized. Normal heart size mediastinal contours post TAVR. Bibasilar atelectasis versus infiltrates, increased. No  significant pleural effusion, pneumothorax or acute osseous findings. IMPRESSION: Bibasilar atelectasis versus infiltrates increased since previous exam, greater on LEFT. Electronically Signed   By: Lavonia Dana M.D.   On: 01/05/2021 17:03    Procedures Procedures   Medications Ordered in ED Medications  vancomycin (VANCOREADY) IVPB 2000 mg/400 mL (has no administration in time range)  furosemide (LASIX) tablet 40 mg (has no administration in time range)  ceFEPIme (MAXIPIME) 2 g in sodium chloride 0.9 % 100 mL IVPB (2 g Intravenous New Bag/Given 01/05/21 1642)    ED Course  I have reviewed the triage vital signs and the nursing notes.  Pertinent labs & imaging results that were available during my care of the patient were reviewed by me and considered in my medical decision making (see chart for details).  Clinical Course as of 01/05/21 1737  Tue Jan 05, 2021  [place] 1 View [JL]    Clinical Course User Index [JL] Iona Beard, MD   MDM Rules/Calculators/A&P                         Raed Arispe is  65 year old male with multiple medial problems  Including renal transplant in 2006 on immunosuppressants, left BKA, DM, Recently underwent TAVR with recurrent pleural effusion. Underwent placement of bilateral thoracentesis 5 days ago presenting with worsening SOB, intermittent fevers, edema, and decreased urination. On arrival patient is tachycardic, tachypneic, saturating in high 80s on room air. Anasarca on exam with decreased breath sounds and rale in lower lung fields bilaterally. Given fevers, recent placement of pulmonary draiange catheters and history immunosuppression high risk for infection. Concern for empyema. Discussed with Dr. Orvan Seen and recommended CT chest to evaluate for pleural effusion, loculations, drain placement.  Anasarca and worsening pleural effusion likely in the setting of worsening renal function and possible HF. CXR bilateral pulmonary atelectasis vs  infiltrates. No significant pleural effusions. Started on IV antibiotics and lasix. Admit for further medical evaultion, diuresis, and antibiotics.  Final Clinical Impression(s) / ED Diagnoses Final diagnoses:  Sepsis [facility])    Rx / DC Orders ED Discharge Orders    None       Iona Beard, MD 01/05/21 1727    Iona Beard, MD 01/05/21 1737    Iona Beard, MD 01/05/21 1740    Noemi Chapel, MD 01/06/21 [phone]

## 2021-01-05 NOTE — Consult Note (Signed)
[place]  Reason for Consultation: AKI in renal transplant Requesting Provider: Dr. Wynetta Fines  HPI: Steven [name] is an 65 y.o. 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   Presented to ED today with dyspnea, dry cough and T 100 following BL pleurex catheter insertion last Friday after developing recurrent transudative pleural effusions since 09/2020 presumed secondary to HFpEF.  On presentation to the ED he was HTN 170s, O2 sat 88% on RA, respiratory distress with CT showing pulmonary edema and pleural effusions.  He was given vancomycin and cefepime for presumed HCAP (which was transitioned to doxycycline) and being diuresed with IV lasix.   He is on fluid restriction, sodium restriction and lasix '20mg'$  daily.  He notes he's gained 10-12lbs in the past few weeks and has LE edema, abd fullness.  Cr noted to be up from baseline mid 2s to mid 4s in the past week.  He has no uremic symptoms, LUTs or difficulty voiding.  No hematuria noted.   His home immunosuppression regimen is tac 1 BID, myfortic 360 BID.  His last visit with his local nephrologist Dr. [name] with in 10/2020 with Cr 2.4, GFR 30. Hb was 8.7 and he was given dose of IV iron for sat 17%.  His mother is with him in the ED.  PMH: Past Medical History:  Diagnosis Date  . Bursitis    left elbow  . Cataract    removed by surgery  . Constipation due to opioid therapy    and decreased ambulation  . Diabetes ([place])    type 2  . 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 Corliss Parish., Kidney Transplant  . GERD (gastroesophageal reflux disease)   . HLD  (hyperlipidemia)   . Hyperparathyroidism ([place])   . Hypertension    Patient denies this DX, no meds  . Kidney transplant recipient   . Obesity   . Pneumonia   . 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 Sleep apnea    uses CPAP nightly  . Venous insufficiency   . Vitamin D deficiency    PSH: Past Surgical History:  Procedure Laterality Date  . AMPUTATION Right 07/19/2017   Procedure: AMPUTATION RIGHT 2ND TOE;  Surgeon: Leandrew Koyanagi, MD;  Location: [place];  Service: Orthopedics;  Laterality: Right;  . AMPUTATION Left 06/03/2020   Procedure: LEFT BELOW KNEE AMPUTATION;  Surgeon: Newt Minion, MD;  Location: [place];  Service: Orthopedics;  Laterality: Left;  . CHEST TUBE INSERTION Bilateral 01/01/2021   Procedure: INSERTION PLEURAL DRAINAGE CATHETER;  Surgeon: Gaye Pollack, MD;  Location: [place];  Service: Thoracic;  Laterality: Bilateral;  . COLONOSCOPY     greater than 10 yrs ago  . EYE SURGERY Bilateral    cataracts removed  . INTRAOPERATIVE TRANSTHORACIC ECHOCARDIOGRAM N/A 09/08/2020   Procedure: INTRAOPERATIVE TRANSTHORACIC ECHOCARDIOGRAM;  Surgeon: Sherren Mocha, MD;  Location: [place];  Service: Open Heart Surgery;  Laterality: N/A;  . IR THORACENTESIS ASP PLEURAL SPACE W/IMG GUIDE  10/29/2020  . IR THORACENTESIS ASP PLEURAL SPACE W/IMG GUIDE  12/11/2020  . KIDNEY TRANSPLANT  2006  . NEPHRECTOMY TRANSPLANTED ORGAN  2006  . RIGHT HEART CATH AND CORONARY ANGIOGRAPHY N/A 06/08/2020   Procedure: RIGHT HEART CATH AND CORONARY ANGIOGRAPHY;  Surgeon: Nelva Bush, MD;  Location: [place] [facility];  Service: Cardiovascular;  Laterality: N/A;  . TOOTH EXTRACTION N/A 08/06/2020   Procedure: DENTAL EXTRACTIONS;  Surgeon: [name], DMD;  Location: [place];  Service: Dentistry;  Laterality: N/A;  . TRANSCATHETER AORTIC VALVE REPLACEMENT, TRANSFEMORAL Left 09/08/2020   Procedure:  TRANSCATHETER AORTIC VALVE REPLACEMENT, LEFT TRANSFEMORAL;  Surgeon: Sherren Mocha, MD;  Location: [place];  Service: Open Heart Surgery;  Laterality: Left;  . UPPER GASTROINTESTINAL ENDOSCOPY    . WISDOM TOOTH EXTRACTION      Past Medical History:  Diagnosis Date  . Bursitis    left elbow  . Cataract    removed by surgery  . Constipation due to opioid therapy    and decreased ambulation  . Diabetes ([place])    type 2  . Diabetic glomerulosclerosis [facility])    [facility] 03/21/12 by Dr. Corliss Parish., Kidney Transplant  . GERD (gastroesophageal reflux disease)   . HLD (hyperlipidemia)   . Hyperparathyroidism ([place])   . Hypertension    Patient denies this DX, no meds  . Kidney transplant recipient   . Obesity   . Pneumonia   . 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 Sleep apnea    uses CPAP nightly  . Venous insufficiency   . Vitamin D deficiency     Medications:  I have reviewed the patient's current medications.  (Not in a hospital admission)   ALLERGIES:   Allergies  Allergen Reactions  . Sulfa Antibiotics Rash    FAM HX: Family History  Problem Relation Age of Onset  . Healthy Mother   . Healthy Father   . Colon cancer Neg Hx   . Rectal cancer Neg Hx   . Stomach cancer Neg Hx     Social History:   reports that he has never smoked. He quit smokeless tobacco use about 2 years ago.  His smokeless tobacco use included chew. He reports that he does not drink alcohol and does not use drugs.  ROS: 12 system ROS neg except per HPI above  Blood pressure (!) 175/75, pulse (!) 110, temperature 98.8 F (37.1 C), resp. rate (!) 22, height '5\' 11"'$  (1.803 m), weight 111.1 kg, SpO2 97 %. PHYSICAL EXAM: Gen: chronically ill appearing  Eyes: anicteric ENT: MM tacky, no thrush Neck: supple CV: tachycardic and regular Abd:  Soft, obese, RLQ  transplant nontender Lungs: ^ RR in low 30s, dec BS BL bases, crackles in mid fields, BL pleurex tubes noted in the anterior low chest wall GU: no foley Extr: 2+ pitting LE edema, L BKA with prosthesis in place, 1+ pitting UE edema Neuro: nonfocal Skin: no rashes   Results for orders placed or performed during the hospital encounter of 01/05/21 (from the past 48 hour(s))  CBC with Differential     Status: Abnormal   Collection Time: 01/05/21  3:50 PM  Result Value Ref Range   WBC 4.9 4.0 - 10.5 K/uL   RBC 3.30 (L) 4.22 - 5.81 MIL/uL   Hemoglobin 8.7 (L) 13.0 - 17.0 g/dL   HCT 26.7 (L) 39.0 - 52.0 %   MCV 80.9 80.0 - 100.0  fL   MCH 26.4 26.0 - 34.0 pg   MCHC 32.6 30.0 - 36.0 g/dL   RDW 17.0 (H) 11.5 - 15.5 %   Platelets 105 (L) 150 - 400 K/uL    Comment: Immature Platelet Fraction may be clinically indicated, consider ordering this additional test [id number] REPEATED TO VERIFY PLATELET COUNT CONFIRMED BY SMEAR    nRBC 0.0 0.0 - 0.2 %   Neutrophils Relative % 82 %   Neutro Abs 4.0 1.7 - 7.7 K/uL   Lymphocytes Relative 8 %   Lymphs Abs 0.4 (L) 0.7 - 4.0 K/uL   Monocytes Relative 7 %   Monocytes Absolute 0.4 0.1 - 1.0 K/uL   Eosinophils Relative 2 %   Eosinophils Absolute 0.1 0.0 - 0.5 K/uL   Basophils Relative 0 %   Basophils Absolute 0.0 0.0 - 0.1 K/uL   Immature Granulocytes 1 %   Abs Immature Granulocytes 0.07 0.00 - 0.07 K/uL    Comment: Performed at [facility], [address], [place], [place] [address]  Comprehensive metabolic panel     Status: Abnormal   Collection Time: 01/05/21  3:50 PM  Result Value Ref Range   Sodium 130 (L) 135 - 145 mmol/L   Potassium 4.2 3.5 - 5.1 mmol/L   Chloride 102 98 - 111 mmol/L   CO2 19 (L) 22 - 32 mmol/L   Glucose, Bld 193 (H) 70 - 99 mg/dL    Comment: Glucose reference range applies only to samples taken after fasting for at least 8 hours.   BUN 71 (H) 8 - 23 mg/dL   Creatinine, Ser 4.71 (H) 0.61 - 1.24 mg/dL   Calcium 7.3  (L) 8.9 - 10.3 mg/dL   Total Protein 4.6 (L) 6.5 - 8.1 g/dL   Albumin 1.8 (L) 3.5 - 5.0 g/dL   AST 18 15 - 41 U/L   ALT 12 0 - 44 U/L   Alkaline Phosphatase 129 (H) 38 - 126 U/L   Total Bilirubin 0.5 0.3 - 1.2 mg/dL   GFR, Estimated 13 (L) >60 mL/min    Comment: (NOTE) Calculated using the CKD-EPI Creatinine Equation (2021)    Anion gap 9 5 - 15    Comment: Performed at [facility], [place] [address] [place], [place] [address]  Protime-INR     Status: Abnormal   Collection Time: 01/05/21  3:50 PM  Result Value Ref Range   Prothrombin Time 15.4 (H) 11.4 - 15.2 seconds   INR 1.2 0.8 - 1.2    Comment: (NOTE) INR goal varies based on device and disease states. Performed at [facility], [place] [address] [place], [place] [address]   APTT     Status: Abnormal   Collection Time: 01/05/21  3:50 PM  Result Value Ref Range   aPTT 43 (H) 24 - 36 seconds    Comment:        IF BASELINE aPTT IS ELEVATED, SUGGEST PATIENT RISK ASSESSMENT BE USED TO DETERMINE APPROPRIATE ANTICOAGULANT THERAPY. Performed at [facility], [place] [address] [place], [place] [address]   Lactic acid, plasma     Status: None   Collection Time: 01/05/21  4:36 PM  Result Value Ref Range   Lactic Acid, Venous 1.0 0.5 - 1.9 mmol/L    Comment: Performed at [facility] [address], [place], [place] [address]    CT CHEST WO CONTRAST  Result Date: 01/05/2021 CLINICAL DATA:  Chest pain or SOB, pleurisy  or effusion suspected Pneumonia, effusion or abscess suspected, xray done Recent chest tube placement. EXAM: CT CHEST WITHOUT CONTRAST TECHNIQUE: Multidetector CT imaging of the chest was performed following the standard protocol without IV contrast. COMPARISON:  Radiograph earlier today. Most recent chest CT 12/29/2020 FINDINGS: Cardiovascular: Aortic atherosclerosis and coronary artery calcifications. TAVR. Borderline cardiomegaly. Mitral annulus calcifications. Similar trace pericardial effusion.  Mediastinum/Nodes: Scattered mediastinal lymph nodes are not enlarged by size criteria. No obvious hilar adenopathy on this noncontrast exam. No thyroid nodule. No esophageal wall thickening. Lungs/Pleura: Right PleurX catheter in place, tip in the pleural space posteriorly the upper lung zone. Moderate right pleural effusion persists. Left PleurX catheter courses inferiorly with tip at the lung base, not entirely included in the field of view. Small to moderate left pleural effusion which is partially loculated and tracking into the fissure. Size of pleural effusions of diminished from prior CT. Ground-glass opacity in septal thickening consistent with pulmonary edema. Compressive atelectasis adjacent to pleural effusions. No pneumothorax. Upper Abdomen: Small volume perihepatic ascites. Musculoskeletal: No acute osseous abnormalities. Diffuse degenerative change throughout the spine. Prominent body wall edema. IMPRESSION: 1. Right PleurX catheter in place, tip in the pleural space posteriorly the upper lung zone. Moderate right pleural effusion persists. Left PleurX catheter courses inferiorly with tip at the lung base, not entirely included in the field of view. Small to moderate left pleural effusion which courses into the inter lobar fissure. Pleural effusions have diminished in size from CT 1 week ago. 2. Pulmonary edema. Borderline cardiomegaly. 3. Small volume perihepatic ascites. Prominent body wall edema. Aortic Atherosclerosis (ICD10-I70.0). Electronically Signed   By: Keith Rake M.D.   On: 01/05/2021 17:53   DG Chest Port 1 View  Result Date: 01/05/2021 CLINICAL DATA:  Shortness of breath, PleurX catheters EXAM: PORTABLE CHEST 1 VIEW COMPARISON:  Portable exam 1655 hours compared to 01/01/2021 FINDINGS: RIGHT PleurX catheter present. LEFT PleurX catheter not definitely visualized. Normal heart size mediastinal contours post TAVR. Bibasilar atelectasis versus infiltrates, increased. No  significant pleural effusion, pneumothorax or acute osseous findings. IMPRESSION: Bibasilar atelectasis versus infiltrates increased since previous exam, greater on LEFT. Electronically Signed   By: Lavonia Dana M.D.   On: 01/05/2021 17:03    Assessment/Plan **Hypoxic respiratory failure:  Secondary to recurrent pleural effusions r/o concurrent pulmonary infection.  CT surgery has been consulted for pleurex drainage and he is on doxycycline for antimicrobial coverage and IV lasix for diuresis.  With immunosuppressed state and low grade fevers check RVP, r/o SARS2 and have a low threshold to broaden antibiotic coverage.   **ESRD s/p renal transplant complicated by AKI:  Has had CKD of his transplant for years with baseline somewhat variable, most recently GFR has been in the high 20s- low 30s as of 10/2020.  Cr last week 4.3 and today 4.7 in the setting of acute decompensated heart failure.  Certainly could be a cardiorenal picture but r/o obstruction with renal US, check urine electrolytes, UA, urine culture.  Per chart it seems his adherence to medical regimen has been in question over the years but he says taking meds regularly; I think acute rejection is low likelihood this far out from a LRD but it remains on the differential for now.  No indication for an emergent renal biopsy.  Check prograf level tomorrow AM, timed for administration time at [facility].  **HFpEF: per above volume overloaded and being diuresed with lasix 40 IV BID.  Follow  response to that dose.  2g Na diet,  fluid restricted, strict I/Os, daily weights.    **DM:  Insulin per primary  **Hyponatremia, hypervolemic: mild at 130, follow with diuresis.  **HTN: medications have been titrated by admitting MD, follow course.   **NAGMA, mild: bicarb 19, suspect secondary to AKI, start po bicarb.   **Anemia:  Hb 8.7, iron indices pending.  If iron replete will benefit from ESA.   **Thrombocytopenia: Plt 105, has been an intermittent issue  over the years.  Trend for now.   Will follow, call with concerns.   Justin Mend 01/05/2021, 7:55 PM

## 2021-01-06 DIAGNOSIS — J90 Pleural effusion, not elsewhere classified: Secondary | ICD-10-CM

## 2021-01-06 LAB — BLOOD CULTURE ID PANEL (REFLEXED) - BCID2

## 2021-01-06 LAB — RESPIRATORY PANEL BY PCR

## 2021-01-06 LAB — GLUCOSE, CAPILLARY
Glucose-Capillary: 172 mg/dL — ABNORMAL HIGH (ref 70–99)
Glucose-Capillary: 214 mg/dL — ABNORMAL HIGH (ref 70–99)
Glucose-Capillary: 166 mg/dL — ABNORMAL HIGH (ref 70–99)
Glucose-Capillary: 175 mg/dL — ABNORMAL HIGH (ref 70–99)

## 2021-01-06 LAB — URINALYSIS, ROUTINE W REFLEX MICROSCOPIC
Bilirubin Urine: NEGATIVE
Glucose, UA: 150 mg/dL — AB
Hgb urine dipstick: NEGATIVE
Ketones, ur: NEGATIVE mg/dL
Leukocytes,Ua: NEGATIVE
Nitrite: NEGATIVE
Protein, ur: >=300 mg/dL — AB
Specific Gravity, Urine: 1.015 (ref 1.005–1.030)
pH: 5 (ref 5.0–8.0)

## 2021-01-06 LAB — BASIC METABOLIC PANEL
Anion gap: 14 (ref 5–15)
BUN: 77 mg/dL — ABNORMAL HIGH (ref 8–23)
CO2: 16 mmol/L — ABNORMAL LOW (ref 22–32)
Calcium: 7.7 mg/dL — ABNORMAL LOW (ref 8.9–10.3)
Chloride: 102 mmol/L (ref 98–111)
Creatinine, Ser: 4.72 mg/dL — ABNORMAL HIGH (ref 0.61–1.24)
GFR, Estimated: 13 mL/min — ABNORMAL LOW (ref >60)
Glucose, Bld: 175 mg/dL — ABNORMAL HIGH (ref 70–99)
Potassium: 4.2 mmol/L (ref 3.5–5.1)
Sodium: 132 mmol/L — ABNORMAL LOW (ref 135–145)

## 2021-01-06 LAB — SODIUM, URINE, RANDOM: Sodium, Ur: 18 mmol/L

## 2021-01-06 LAB — LIPID PANEL
Cholesterol: 83 mg/dL (ref 0–200)
HDL: 26 mg/dL — ABNORMAL LOW (ref >40)
LDL Cholesterol: 39 mg/dL (ref 0–99)
Total CHOL/HDL Ratio: 3.2 RATIO
Triglycerides: 92 mg/dL (ref <150)
VLDL: 18 mg/dL (ref 0–40)

## 2021-01-06 LAB — CREATININE, URINE, RANDOM: Creatinine, Urine: 84.97 mg/dL

## 2021-01-06 LAB — HIV ANTIBODY (ROUTINE TESTING W REFLEX): HIV Screen 4th Generation wRfx: NONREACTIVE

## 2021-01-06 MED ORDER — ENSURE ENLIVE PO LIQD
237.0000 mL | Freq: Two times a day (BID) | ORAL | Status: DC
  Administered 2021-01-06 – 2021-01-07 (×2): 237 mL via ORAL

## 2021-01-06 MED ORDER — FUROSEMIDE 10 MG/ML IJ SOLN
80.0000 mg | Freq: Two times a day (BID) | INTRAMUSCULAR | Status: DC
  Administered 2021-01-06: 80 mg via INTRAVENOUS
  Filled 2021-01-06: qty 8

## 2021-01-06 MED ORDER — SODIUM CHLORIDE 0.9 % IV SOLN
2.0000 g | Freq: Three times a day (TID) | INTRAVENOUS | Status: DC
  Filled 2021-01-06 (×2): qty 2000

## 2021-01-06 MED ORDER — SODIUM CHLORIDE 0.9 % IV SOLN
2.0000 g | Freq: Two times a day (BID) | INTRAVENOUS | Status: DC
  Administered 2021-01-06 – 2021-01-12 (×14): 2 g via INTRAVENOUS
  Filled 2021-01-06 (×9): qty 20
  Filled 2021-01-06: qty 2
  Filled 2021-01-06 (×8): qty 20

## 2021-01-06 MED ORDER — SODIUM CHLORIDE 0.9 % IV SOLN: 510.0000 mg | INTRAVENOUS | Status: DC

## 2021-01-06 MED ORDER — SODIUM CHLORIDE 0.9 % IV SOLN
250.0000 mg | Freq: Every day | INTRAVENOUS | Status: DC
  Administered 2021-01-06 – 2021-01-07 (×2): 250 mg via INTRAVENOUS
  Filled 2021-01-06 (×3): qty 20

## 2021-01-06 MED ORDER — SODIUM CHLORIDE 0.9 % IV SOLN
2.0000 g | Freq: Three times a day (TID) | INTRAVENOUS | Status: DC
  Administered 2021-01-06 – 2021-01-14 (×23): 2 g via INTRAVENOUS
  Filled 2021-01-06: qty 2
  Filled 2021-01-06 (×5): qty 2000
  Filled 2021-01-06: qty 2
  Filled 2021-01-06 (×6): qty 2000
  Filled 2021-01-06: qty 2
  Filled 2021-01-06 (×3): qty 2000
  Filled 2021-01-06: qty 2
  Filled 2021-01-06 (×2): qty 2000
  Filled 2021-01-06 (×3): qty 2
  Filled 2021-01-06: qty 2000
  Filled 2021-01-06: qty 2
  Filled 2021-01-06 (×2): qty 2000

## 2021-01-06 NOTE — Plan of Care (Signed)
  Problem: Clinical Measurements: Goal: Respiratory complications will improve Outcome: Progressing   Problem: Activity: Goal: Risk for activity intolerance will decrease Outcome: Progressing   

## 2021-01-06 NOTE — Progress Notes (Signed)
Pt stated he used nasal pillows at home for his CPAP unit.  Pt tried the nasal mask the previous night and states he couldn't tolerate the mask.  He doesn't want to try the FFM, therefore pt wants to wear the [place] for the night. Pt is on 2L [place], Sp2 is 97%

## 2021-01-06 NOTE — Progress Notes (Signed)
Spoke with Dr. Darrick Meigs via secure chat regarding patient's blood pressure of 126/60 with hydralazine, lasix and coreg all due and made MD aware that after pleurX catheter was drained BP was 108/51. MD gave order to hold lasix, hydralazine and coreg.

## 2021-01-06 NOTE — Progress Notes (Signed)
Both PleurX catheters drained per order. 150 cc serosanguinous fluid drained from right and 950 cc serousanguinous fluid drained from left. Patient tolerated well. Both catheter dressings changed.

## 2021-01-06 NOTE — Progress Notes (Signed)
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  After drainage patient started to have chest discomfort.  Yesterday home health RN noticed that patient was having increasing swelling of lower extremities so she did not drain the Pleurx catheter and asked him to go to the ED for further evaluation.  In the ED CT chest showed pulmonary edema and pleural effusion right more than left, worsening kidney function with creatinine 4.7 compared to 4.3, 3 days ago.  He was started on vancomycin and cefepime for possible HCAP. Antibiotics were discontinued as infection was not suspected by the admitting physician.   Subjective   Patient seen and examined, continues to have shortness of breath.   Assessment/Plan:     Acute on chronic diastolic CHF -Nephrology has increased Lasix to 80 mg IV twice daily -We will obtain echocardiogram -Strict intake and output   Bilateral pleural effusion -Likely from underlying congestive heart failure -Has Pleurx catheter placed bilaterally -We will order Pleurx catheter drainage every other day   Enterococcus bacteremia -ID has been consulted -Patient started on ceftriaxone, ampicillin -TTE has been ordered -We will follow the result   CKD stage IV -He has proteinuria -Nephrology consulted   Diabetes mellitus type 2 -We will hold glipizide -Continue Levemir 10 units subcu daily -Sliding scale insulin with  NovoLog  Scheduled medications:   . aspirin  81 mg Oral Daily  . atorvastatin  10 mg Oral Daily  . carvedilol  3.125 mg Oral BID WC  . clopidogrel  75 mg Oral Daily  . docusate sodium  100 mg Oral Daily  . feeding supplement  237 mL Oral BID BM  . fenofibrate  160 mg Oral Daily  . furosemide  80 mg Intravenous BID  . glyBURIDE  5 mg Oral BID WC  . hydrALAZINE  50 mg Oral TID  . insulin aspart  0-9 Units Subcutaneous TID WC  . insulin detemir  10 Units Subcutaneous Daily  . mycophenolate  360 mg Oral BID  . pantoprazole  40 mg Oral Daily  . sodium bicarbonate  650 mg Oral BID  . sodium chloride flush  3 mL Intravenous Q12H  . tacrolimus  1 mg Oral BID         Data Reviewed:   CBG:  Recent Labs  Lab 01/01/21 0850 01/05/21 2306 01/06/21 0536 01/06/21 1122 01/06/21 1647  GLUCAP 123* 160* 172* 214* 166*    SpO2: 97 % O2 Flow Rate (L/min): 2 L/min    Vitals:   01/06/21 1200 01/06/21 1541 01/06/21 1612 01/06/21 1700  BP: 130/68 124/60 (!) 108/51 126/60  Pulse: 89 82 82 85  Resp: '18 20 18 '$ (!) 28  Temp:   (!) 97.5 F (36.4 C)   TempSrc:   Axillary   SpO2: 99% 96% 99% 97%  Weight:      Height:         Intake/Output Summary (Last 24 hours) at 01/06/2021 1813 Last data filed at 01/06/2021 1640 Gross per 24 hour  Intake 802.21  ml  Output 1700 ml  Net -897.79 ml    06/06 1901 - 06/08 0700 In: 322.2 [P.O.:240] Out: -   Filed Weights   01/05/21 1528 01/06/21 0103  Weight: 111.1 kg 118.2 kg    CBC:  Recent Labs  Lab 12/31/20 0847 01/01/21 0531 01/05/21 1550  WBC 4.2 3.3* 4.9  HGB 10.2* 8.9* 8.7*  HCT 32.7* 28.0* 26.7*  PLT 78* 59* 105*  MCV 84.5 83.3 80.9  MCH 26.4 26.5 26.4  MCHC 31.2 31.8 32.6  RDW 18.4* 18.1* 17.0*  LYMPHSABS  --   --  0.4*  MONOABS  --   --  0.4  EOSABS  --   --  0.1  BASOSABS  --   --  0.0    Complete metabolic panel:  Recent Labs  Lab 12/31/20 0847 01/01/21 0531 01/05/21 1550 01/05/21 1636 01/05/21 1950  01/06/21 0240  NA 138 136 130*  --   --  132*  K 4.0 3.3* 4.2  --   --  4.2  CL 104 107 102  --   --  102  CO2 21* 20* 19*  --   --  16*  GLUCOSE 106* 34* 193*  --   --  175*  BUN 51* 54* 71*  --   --  77*  CREATININE 4.34* 4.43* 4.71*  --   --  4.72*  CALCIUM 8.2* 7.5* 7.3*  --   --  7.7*  AST 21  --  18  --   --   --   ALT 19  --  12  --   --   --   ALKPHOS 113  --  129*  --   --   --   BILITOT 0.9  --  0.5  --   --   --   ALBUMIN 2.2*  --  1.8*  --   --   --   LATICACIDVEN  --   --   --  1.0 0.8  --   INR 1.3*  --  1.2  --   --   --   HGBA1C 5.2  --   --   --   --   --     No results for input(s): LIPASE, AMYLASE in the last 168 hours.  Recent Labs  Lab 01/05/21 2057  [place]    ------------------------------------------------------------------------------------------------------------------ Recent Labs    01/06/21 0240  CHOL 83  HDL 26*  LDLCALC 39  TRIG 92  CHOLHDL 3.2    Lab Results  Component Value Date   HGBA1C 5.2 12/31/2020   ------------------------------------------------------------------------------------------------------------------ No results for input(s): TSH, T4TOTAL, T3FREE, THYROIDAB in the last 72 hours.  Invalid input(s): FREET3 ------------------------------------------------------------------------------------------------------------------ Recent Labs    01/05/21 1930  FERRITIN 405*  TIBC 155*  IRON 14*    Coagulation profile Recent Labs  Lab 12/31/20 0847 01/05/21 1550  INR 1.3* 1.2   No results for input(s): DDIMER in the last 72 hours.  Cardiac Enzymes No results for input(s): CKTOTAL, CKMB, CKMBINDEX, TROPONINI in the last 168 hours.  ------------------------------------------------------------------------------------------------------------------    Component Value Date/Time   BNP 1,403.3 (H) 06/04/2020 [id number]     Antibiotics: Anti-infectives (From admission, onward)   Start     Dose/Rate Route  Frequency Ordered Stop   01/06/21 1500  cefTRIAXone (ROCEPHIN) 2 g in sodium chloride 0.9 % 100 mL IVPB        2 g 200 mL/hr over 30 Minutes Intravenous Every 12 hours 01/06/21 1204  01/06/21 1400  ampicillin (OMNIPEN) 2 g in sodium chloride 0.9 % 100 mL IVPB  Status:  Discontinued        2 g 300 mL/hr over 20 Minutes Intravenous Every 8 hours 01/06/21 1204 01/06/21 1205   01/06/21 1300  ampicillin (OMNIPEN) 2 g in sodium chloride 0.9 % 100 mL IVPB        2 g 300 mL/hr over 20 Minutes Intravenous Every 8 hours 01/06/21 1205     01/05/21 2200  doxycycline (VIBRA-TABS) tablet 100 mg  Status:  Discontinued        100 mg Oral Every 12 hours 01/05/21 1849 01/06/21 1433   01/05/21 1630  vancomycin (VANCOREADY) IVPB 2000 mg/400 mL        2,000 mg 200 mL/hr over 120 Minutes Intravenous  Once 01/05/21 1616 01/05/21 2015   01/05/21 1630  ceFEPIme (MAXIPIME) 2 g in sodium chloride 0.9 % 100 mL IVPB        2 g 200 mL/hr over 30 Minutes Intravenous  Once 01/05/21 1616 01/05/21 1742       Radiology Reports  CT CHEST WO CONTRAST  Result Date: 01/05/2021 CLINICAL DATA:  Chest pain or SOB, pleurisy or effusion suspected Pneumonia, effusion or abscess suspected, xray done Recent chest tube placement. EXAM: CT CHEST WITHOUT CONTRAST TECHNIQUE: Multidetector CT imaging of the chest was performed following the standard protocol without IV contrast. COMPARISON:  Radiograph earlier today. Most recent chest CT 12/29/2020 FINDINGS: Cardiovascular: Aortic atherosclerosis and coronary artery calcifications. TAVR. Borderline cardiomegaly. Mitral annulus calcifications. Similar trace pericardial effusion. Mediastinum/Nodes: Scattered mediastinal lymph nodes are not enlarged by size criteria. No obvious hilar adenopathy on this noncontrast exam. No thyroid nodule. No esophageal wall thickening. Lungs/Pleura: Right PleurX catheter in place, tip in the pleural space posteriorly the upper lung zone. Moderate right  pleural effusion persists. Left PleurX catheter courses inferiorly with tip at the lung base, not entirely included in the field of view. Small to moderate left pleural effusion which is partially loculated and tracking into the fissure. Size of pleural effusions of diminished from prior CT. Ground-glass opacity in septal thickening consistent with pulmonary edema. Compressive atelectasis adjacent to pleural effusions. No pneumothorax. Upper Abdomen: Small volume perihepatic ascites. Musculoskeletal: No acute osseous abnormalities. Diffuse degenerative change throughout the spine. Prominent body wall edema. IMPRESSION: 1. Right PleurX catheter in place, tip in the pleural space posteriorly the upper lung zone. Moderate right pleural effusion persists. Left PleurX catheter courses inferiorly with tip at the lung base, not entirely included in the field of view. Small to moderate left pleural effusion which courses into the inter lobar fissure. Pleural effusions have diminished in size from CT 1 week ago. 2. Pulmonary edema. Borderline cardiomegaly. 3. Small volume perihepatic ascites. Prominent body wall edema. Aortic Atherosclerosis (ICD10-I70.0). Electronically Signed   By: Keith Rake M.D.   On: 01/05/2021 17:53   US Renal Transplant w/Doppler  Result Date: 01/05/2021 CLINICAL DATA:  Acute kidney injury. EXAM: ULTRASOUND OF RENAL TRANSPLANT WITH RENAL DOPPLER ULTRASOUND TECHNIQUE: Ultrasound examination of the renal transplant was performed with gray-scale, color and duplex doppler evaluation. COMPARISON:  Abdominal CTA 07/16/2020 FINDINGS: Transplant kidney location: RLQ Transplant Kidney: Renal measurements: 13.4 x 7.5 x 5.6 cm = volume: 283m. Normal in size and parenchymal echogenicity. Mild to moderate hydronephrosis. No evidence of renal mass. No renal stone. No peri-transplant fluid collection seen. Color flow in the main renal artery:  Yes Color flow in the main renal vein:  Yes Duplex  Doppler  Evaluation: Main Renal Artery Velocity: 73 cm/sec Main Renal Artery Resistive Index: 1.0 Venous waveform in main renal vein:  Present. Intrarenal resistive index in upper pole:  1.0 (normal 0.6-0.8; equivocal 0.8-0.9; abnormal >= 0.9) Intrarenal resistive index in lower pole: 1.0 (normal 0.6-0.8; equivocal 0.8-0.9; abnormal >= 0.9) Bladder: Normal for degree of bladder distention. The ureteral jet was not visualized. No bladder wall thickening or stone. Other findings:  Small amount of right lower quadrant ascites. IMPRESSION: 1. Mild to moderate hydronephrosis of the right lower quadrant renal transplant. Ureteral jet is not seen. Etiology is indeterminate, no urolithiasis is visualized. 2. Elevated intrarenal and main renal artery resistive indices. 3. Patent renal vein. 4. Small amount of right lower quadrant ascites. Electronically Signed   By: Keith Rake M.D.   On: 01/05/2021 20:32   DG Chest Port 1 View  Result Date: 01/05/2021 CLINICAL DATA:  Shortness of breath, PleurX catheters EXAM: PORTABLE CHEST 1 VIEW COMPARISON:  Portable exam 1655 hours compared to 01/01/2021 FINDINGS: RIGHT PleurX catheter present. LEFT PleurX catheter not definitely visualized. Normal heart size mediastinal contours post TAVR. Bibasilar atelectasis versus infiltrates, increased. No significant pleural effusion, pneumothorax or acute osseous findings. IMPRESSION: Bibasilar atelectasis versus infiltrates increased since previous exam, greater on LEFT. Electronically Signed   By: Lavonia Dana M.D.   On: 01/05/2021 17:03      DVT prophylaxis: Foot pumps  Code Status: Full code  Family Communication: No family at bedside   Consultants:  Nephrology  Procedures:      Objective    Physical Examination:    General-appears in no acute distress  Heart-S1-S2, regular, no murmur auscultated  Lungs-bibasilar crackles auscultated  Abdomen-soft, nontender, no organomegaly  Extremities-3+ pitting edema of  the lower extremities  Neuro-alert, oriented x3, no focal deficit noted   Status is: Inpatient  Dispo: The patient is from: Home              Anticipated d/c is to: Home              Anticipated d/c date is: 01/10/2021              Patient currently not stable for discharge  Barrier to discharge-ongoing acute CHF  COVID-19 Labs  Recent Labs    01/05/21 1930  FERRITIN 405*  LDH 190    Lab Results  Component Value Date   [place] NEGATIVE 01/05/2021   [place] NEGATIVE 12/09/2020   [place] NEGATIVE 10/26/2020   [place] NEGATIVE 09/05/2020    Microbiology  Recent Results (from the past 240 hour(s))  Surgical pcr screen     Status: Abnormal   Collection Time: 12/31/20  8:46 AM   Specimen: Nasal Mucosa; Nasal Swab  Result Value Ref Range Status   MRSA, PCR NEGATIVE NEGATIVE Final   Staphylococcus aureus POSITIVE (A) NEGATIVE Final    Comment: (NOTE) The Xpert SA Assay (FDA approved for NASAL specimens in patients 30 years of age and older), is one component of a comprehensive surveillance program. It is not intended to diagnose infection nor to guide or monitor treatment. Performed at [facility], [place] [address] [place], [place] [address]   Culture, blood (x 2)     Status: None (Preliminary result)   Collection Time: 01/05/21  4:36 PM   Specimen: BLOOD LEFT FOREARM  Result Value Ref Range Status   Specimen Description BLOOD LEFT FOREARM  Final   Special Requests   Final    BOTTLES DRAWN AEROBIC  AND ANAEROBIC Blood Culture adequate volume   Culture  Setup Time   Final    GRAM POSITIVE COCCI IN BOTH AEROBIC AND ANAEROBIC BOTTLES CRITICAL RESULT CALLED TO, READ BACK BY AND VERIFIED WITH: PHARMD JEREMY FRENS AT [place] ON 01/06/21 BY KJ Performed at [facility], [place] [address], [place], [place] [address]    Culture GRAM POSITIVE COCCI  Final   Report Status PENDING  Incomplete  Blood Culture ID Panel (Reflexed)     Status: Abnormal    Collection Time: 01/05/21  4:36 PM  Result Value Ref Range Status   Enterococcus faecalis DETECTED (A) NOT DETECTED Final    Comment: CRITICAL RESULT CALLED TO, READ BACK BY AND VERIFIED WITH: PHARMD JEREMY FRENS AT [id number] ON 6/85/22 BY KJ    Enterococcus Faecium NOT DETECTED NOT DETECTED Corrected    Comment: CORRECTED ON 06/08 AT 1234: PREVIOUSLY REPORTED AS NOT DETECTED CRITICAL RESULT CALLED TO, READ BACK BY AND VERIFIED WITH: PHARMD JEREMY FRENS AT [id number] ON 6/85/22 BY KJ   Listeria monocytogenes NOT DETECTED NOT DETECTED Final   Staphylococcus species NOT DETECTED NOT DETECTED Final   Staphylococcus aureus (BCID) NOT DETECTED NOT DETECTED Final   Staphylococcus epidermidis NOT DETECTED NOT DETECTED Final   Staphylococcus lugdunensis NOT DETECTED NOT DETECTED Final   Streptococcus species NOT DETECTED NOT DETECTED Final   Streptococcus agalactiae NOT DETECTED NOT DETECTED Final   Streptococcus pneumoniae NOT DETECTED NOT DETECTED Final   Streptococcus pyogenes NOT DETECTED NOT DETECTED Final   A.calcoaceticus-baumannii NOT DETECTED NOT DETECTED Final   Bacteroides fragilis NOT DETECTED NOT DETECTED Final   Enterobacterales NOT DETECTED NOT DETECTED Final   Enterobacter cloacae complex NOT DETECTED NOT DETECTED Final   Escherichia coli NOT DETECTED NOT DETECTED Final   Klebsiella aerogenes NOT DETECTED NOT DETECTED Final   Klebsiella oxytoca NOT DETECTED NOT DETECTED Final   Klebsiella pneumoniae NOT DETECTED NOT DETECTED Final   Proteus species NOT DETECTED NOT DETECTED Final   Salmonella species NOT DETECTED NOT DETECTED Final   Serratia marcescens NOT DETECTED NOT DETECTED Final   Haemophilus influenzae NOT DETECTED NOT DETECTED Final   Neisseria meningitidis NOT DETECTED NOT DETECTED Final   Pseudomonas aeruginosa NOT DETECTED NOT DETECTED Final   Stenotrophomonas maltophilia NOT DETECTED NOT DETECTED Final   Candida albicans NOT DETECTED NOT DETECTED Final   Candida auris NOT  DETECTED NOT DETECTED Final   Candida glabrata NOT DETECTED NOT DETECTED Final   Candida krusei NOT DETECTED NOT DETECTED Final   Candida parapsilosis NOT DETECTED NOT DETECTED Final   Candida tropicalis NOT DETECTED NOT DETECTED Final   Cryptococcus neoformans/gattii NOT DETECTED NOT DETECTED Final   Vancomycin resistance NOT DETECTED NOT DETECTED Final    Comment: Performed at [facility], [address] [place], [place] [address]  Culture, blood (x 2)     Status: None (Preliminary result)   Collection Time: 01/05/21  7:26 PM   Specimen: BLOOD  Result Value Ref Range Status   Specimen Description BLOOD SITE NOT SPECIFIED  Final   Special Requests   Final    BOTTLES DRAWN AEROBIC AND ANAEROBIC Blood Culture results may not be optimal due to an inadequate volume of blood received in culture bottles   Culture   Final    NO GROWTH < 12 HOURS Performed at [facility], [place] [address], [place], [place] [address]    Report Status PENDING  Incomplete  Resp Panel by RT-PCR (Flu  A&B, Covid) Nasopharyngeal Swab     Status: None   Collection Time: 01/05/21  8:57 PM   Specimen: Nasopharyngeal Swab; Nasopharyngeal(NP) swabs in vial transport medium  Result Value Ref Range Status   SARS Coronavirus 2 by RT PCR NEGATIVE NEGATIVE Final    Comment: (NOTE) SARS-CoV-2 target nucleic acids are NOT DETECTED.  The SARS-CoV-2 RNA is generally detectable in upper respiratory specimens during the acute phase of infection. The lowest concentration of SARS-CoV-2 viral copies this assay can detect is 138 copies/mL. A negative result does not preclude SARS-Cov-2 infection and should not be used as the sole basis for treatment or other patient management decisions. A negative result may occur with  improper specimen collection/handling, submission of specimen other than nasopharyngeal swab, presence of viral mutation(s) within the areas targeted by this assay, and inadequate number of  viral copies(<138 copies/mL). A negative result must be combined with clinical observations, patient history, and epidemiological information. The expected result is Negative.  Fact Sheet for Patients:  EntrepreneurPulse.com.au  Fact Sheet for Healthcare Providers:  IncredibleEmployment.be  This test is no t yet approved or cleared by the [place] FDA and  has been authorized for detection and/or diagnosis of SARS-CoV-2 by FDA under an Emergency Use Authorization (EUA). This EUA will remain  in effect (meaning this test can be used) for the duration of the COVID-19 declaration under Section 564(b)(1) of the Act, 21 U.S.C.section 360bbb-3(b)(1), unless the authorization is terminated  or revoked sooner.       Influenza A by PCR NEGATIVE NEGATIVE Final   Influenza B by PCR NEGATIVE NEGATIVE Final    Comment: (NOTE) The Xpert Xpress SARS-CoV-2/FLU/RSV plus assay is intended as an aid in the diagnosis of influenza from Nasopharyngeal swab specimens and should not be used as a sole basis for treatment. Nasal washings and aspirates are unacceptable for Xpert Xpress SARS-CoV-2/FLU/RSV testing.  Fact Sheet for Patients: EntrepreneurPulse.com.au  Fact Sheet for Healthcare Providers: IncredibleEmployment.be  This test is not yet approved or cleared by the [place] FDA and has been authorized for detection and/or diagnosis of SARS-CoV-2 by FDA under an Emergency Use Authorization (EUA). This EUA will remain in effect (meaning this test can be used) for the duration of the COVID-19 declaration under Section 564(b)(1) of the Act, 21 U.S.C. section 360bbb-3(b)(1), unless the authorization is terminated or revoked.  Performed at [facility], [place] [address], [place], [place] [address]   Respiratory (~20 pathogens) panel by PCR     Status: None   Collection Time: 01/05/21 11:26 PM  Result Value Ref  Range Status   Adenovirus NOT DETECTED NOT DETECTED Final   Coronavirus 229E NOT DETECTED NOT DETECTED Final    Comment: (NOTE) The Coronavirus on the Respiratory Panel, DOES NOT test for the novel  Coronavirus (2019 nCoV)    Coronavirus HKU1 NOT DETECTED NOT DETECTED Final   Coronavirus NL63 NOT DETECTED NOT DETECTED Final   Coronavirus OC43 NOT DETECTED NOT DETECTED Final   Metapneumovirus NOT DETECTED NOT DETECTED Final   Rhinovirus / Enterovirus NOT DETECTED NOT DETECTED Final   Influenza A NOT DETECTED NOT DETECTED Final   Influenza B NOT DETECTED NOT DETECTED Final   Parainfluenza Virus 1 NOT DETECTED NOT DETECTED Final   Parainfluenza Virus 2 NOT DETECTED NOT DETECTED Final   Parainfluenza Virus 3 NOT DETECTED NOT DETECTED Final   Parainfluenza Virus 4 NOT DETECTED NOT DETECTED Final   Respiratory Syncytial Virus NOT DETECTED NOT DETECTED Final  Bordetella pertussis NOT DETECTED NOT DETECTED Final   Bordetella Parapertussis NOT DETECTED NOT DETECTED Final   Chlamydophila pneumoniae NOT DETECTED NOT DETECTED Final   Mycoplasma pneumoniae NOT DETECTED NOT DETECTED Final    Comment: Performed at [facility], [place] [address], [place], [place] [address]    Pressure Injury 06/08/20 Coccyx Stage 1 -  Intact skin with non-blanchable redness of a localized area usually over a bony prominence. (Active)  06/08/20 1500  Location: Coccyx  Location Orientation:   Staging: Stage 1 -  Intact skin with non-blanchable redness of a localized area usually over a bony prominence.  Wound Description (Comments):   Present on Admission: Yes (present upon transfer)          [facility] If 7PM-7AM, please contact night-coverage at [email or web address], Office  [phone]   01/06/2021, 6:14 PM  LOS: 1 day

## 2021-01-06 NOTE — Progress Notes (Signed)
PHARMACY - PHYSICIAN COMMUNICATION CRITICAL VALUE ALERT - BLOOD CULTURE IDENTIFICATION (BCID)  Steven [name] is an 65 y.o. male who presented to [facility] on 01/05/2021 with a chief complaint of shortness of breath  Assessment:  Patient with one blood culture positive for enterococcus faecalis (report says "pending" and lab will correct to say "detected").  Vancomycin resistance not detected.  Patient had TAVR earlier this year.  Name of physician (or Provider) Contacted: Dr. Darrick Meigs and ID team  Current antibiotics: doxycycline  Changes to prescribed antibiotics recommended: add ampicillin - ID team will assess today   Results for orders placed or performed during the hospital encounter of 01/05/21  Blood Culture ID Panel (Reflexed) (Collected: 01/05/2021  4:36 PM)  Result Value Ref Range   Enterococcus faecalis PENDING NOT DETECTED   Enterococcus Faecium NOT DETECTED NOT DETECTED   Listeria monocytogenes NOT DETECTED NOT DETECTED   Staphylococcus species NOT DETECTED NOT DETECTED   Staphylococcus aureus (BCID) NOT DETECTED NOT DETECTED   Staphylococcus epidermidis NOT DETECTED NOT DETECTED   Staphylococcus lugdunensis NOT DETECTED NOT DETECTED   Streptococcus species NOT DETECTED NOT DETECTED   Streptococcus agalactiae NOT DETECTED NOT DETECTED   Streptococcus pneumoniae NOT DETECTED NOT DETECTED   Streptococcus pyogenes NOT DETECTED NOT DETECTED   A.calcoaceticus-baumannii NOT DETECTED NOT DETECTED   Bacteroides fragilis NOT DETECTED NOT DETECTED   Enterobacterales NOT DETECTED NOT DETECTED   Enterobacter cloacae complex NOT DETECTED NOT DETECTED   Escherichia coli NOT DETECTED NOT DETECTED   Klebsiella aerogenes NOT DETECTED NOT DETECTED   Klebsiella oxytoca NOT DETECTED NOT DETECTED   Klebsiella pneumoniae NOT DETECTED NOT DETECTED   Proteus species NOT DETECTED NOT DETECTED   Salmonella species NOT DETECTED NOT DETECTED   Serratia marcescens NOT DETECTED NOT DETECTED    Haemophilus influenzae NOT DETECTED NOT DETECTED   Neisseria meningitidis NOT DETECTED NOT DETECTED   Pseudomonas aeruginosa NOT DETECTED NOT DETECTED   Stenotrophomonas maltophilia NOT DETECTED NOT DETECTED   Candida albicans NOT DETECTED NOT DETECTED   Candida auris NOT DETECTED NOT DETECTED   Candida glabrata NOT DETECTED NOT DETECTED   Candida krusei NOT DETECTED NOT DETECTED   Candida parapsilosis NOT DETECTED NOT DETECTED   Candida tropicalis NOT DETECTED NOT DETECTED   Cryptococcus neoformans/gattii NOT DETECTED NOT DETECTED   Vancomycin resistance NOT DETECTED NOT DETECTED    [name] 01/06/2021  11:59 AM

## 2021-01-06 NOTE — Progress Notes (Addendum)
Patient ID: Steven [name], male   DOB: May 09, 1956, 65 y.o.   MRN: [id number] S: Feels better today. O:BP 137/71 (BP Location: Right Arm)   Pulse 94   Temp 97.9 F (36.6 C) (Oral)   Resp 18   Ht '5\' 11"'$  (1.803 m)   Wt 118.2 kg   SpO2 97%   BMI 36.34 kg/m   Intake/Output Summary (Last 24 hours) at 01/06/2021 1111 Last data filed at 01/06/2021 0300 Gross per 24 hour  Intake 322.21 ml  Output --  Net 322.21 ml   Intake/Output: I/O last 3 completed shifts: In: 322.2 [P.O.:240; IV Piggyback:82.2] Out: -   Intake/Output this shift:  No intake/output data recorded. Weight change:  Gen: NAD CVS: RRR Resp: decreased BS at bases Abd: obese, +BS, soft, NT/ND Ext: 2+ anasarca to mid abdomen, LBKA  Recent Labs  Lab 12/31/20 0847 01/01/21 0531 01/05/21 1550 01/06/21 0240  NA 138 136 130* 132*  K 4.0 3.3* 4.2 4.2  CL 104 107 102 102  CO2 21* 20* 19* 16*  GLUCOSE 106* 34* 193* 175*  BUN 51* 54* 71* 77*  CREATININE 4.34* 4.43* 4.71* 4.72*  ALBUMIN 2.2*  --  1.8*  --   CALCIUM 8.2* 7.5* 7.3* 7.7*  AST 21  --  18  --   ALT 19  --  12  --    Liver Function Tests: Recent Labs  Lab 12/31/20 0847 01/05/21 1550  AST 21 18  ALT 19 12  ALKPHOS 113 129*  BILITOT 0.9 0.5  PROT 5.1* 4.6*  ALBUMIN 2.2* 1.8*   No results for input(s): LIPASE, AMYLASE in the last 168 hours. No results for input(s): AMMONIA in the last 168 hours. CBC: Recent Labs  Lab 12/31/20 0847 01/01/21 0531 01/05/21 1550  WBC 4.2 3.3* 4.9  NEUTROABS  --   --  4.0  HGB 10.2* 8.9* 8.7*  HCT 32.7* 28.0* 26.7*  MCV 84.5 83.3 80.9  PLT 78* 59* 105*   Cardiac Enzymes: No results for input(s): CKTOTAL, CKMB, CKMBINDEX, TROPONINI in the last 168 hours. CBG: Recent Labs  Lab 01/01/21 0754 01/01/21 0825 01/01/21 0850 01/05/21 2306 01/06/21 0536  GLUCAP 114* 114* 123* 160* 172*    Iron Studies:  Recent Labs    01/05/21 1930  IRON 14*  TIBC 155*  FERRITIN 405*   Studies/Results: CT CHEST WO  CONTRAST  Result Date: 01/05/2021 CLINICAL DATA:  Chest pain or SOB, pleurisy or effusion suspected Pneumonia, effusion or abscess suspected, xray done Recent chest tube placement. EXAM: CT CHEST WITHOUT CONTRAST TECHNIQUE: Multidetector CT imaging of the chest was performed following the standard protocol without IV contrast. COMPARISON:  Radiograph earlier today. Most recent chest CT 12/29/2020 FINDINGS: Cardiovascular: Aortic atherosclerosis and coronary artery calcifications. TAVR. Borderline cardiomegaly. Mitral annulus calcifications. Similar trace pericardial effusion. Mediastinum/Nodes: Scattered mediastinal lymph nodes are not enlarged by size criteria. No obvious hilar adenopathy on this noncontrast exam. No thyroid nodule. No esophageal wall thickening. Lungs/Pleura: Right PleurX catheter in place, tip in the pleural space posteriorly the upper lung zone. Moderate right pleural effusion persists. Left PleurX catheter courses inferiorly with tip at the lung base, not entirely included in the field of view. Small to moderate left pleural effusion which is partially loculated and tracking into the fissure. Size of pleural effusions of diminished from prior CT. Ground-glass opacity in septal thickening consistent with pulmonary edema. Compressive atelectasis adjacent to pleural effusions. No pneumothorax. Upper Abdomen: Small volume perihepatic ascites. Musculoskeletal: No acute osseous  abnormalities. Diffuse degenerative change throughout the spine. Prominent body wall edema. IMPRESSION: 1. Right PleurX catheter in place, tip in the pleural space posteriorly the upper lung zone. Moderate right pleural effusion persists. Left PleurX catheter courses inferiorly with tip at the lung base, not entirely included in the field of view. Small to moderate left pleural effusion which courses into the inter lobar fissure. Pleural effusions have diminished in size from CT 1 week ago. 2. Pulmonary edema. Borderline  cardiomegaly. 3. Small volume perihepatic ascites. Prominent body wall edema. Aortic Atherosclerosis (ICD10-I70.0). Electronically Signed   By: Keith Rake M.D.   On: 01/05/2021 17:53   US Renal Transplant w/Doppler  Result Date: 01/05/2021 CLINICAL DATA:  Acute kidney injury. EXAM: ULTRASOUND OF RENAL TRANSPLANT WITH RENAL DOPPLER ULTRASOUND TECHNIQUE: Ultrasound examination of the renal transplant was performed with gray-scale, color and duplex doppler evaluation. COMPARISON:  Abdominal CTA 07/16/2020 FINDINGS: Transplant kidney location: RLQ Transplant Kidney: Renal measurements: 13.4 x 7.5 x 5.6 cm = volume: 255m. Normal in size and parenchymal echogenicity. Mild to moderate hydronephrosis. No evidence of renal mass. No renal stone. No peri-transplant fluid collection seen. Color flow in the main renal artery:  Yes Color flow in the main renal vein:  Yes Duplex Doppler Evaluation: Main Renal Artery Velocity: 73 cm/sec Main Renal Artery Resistive Index: 1.0 Venous waveform in main renal vein:  Present. Intrarenal resistive index in upper pole:  1.0 (normal 0.6-0.8; equivocal 0.8-0.9; abnormal >= 0.9) Intrarenal resistive index in lower pole: 1.0 (normal 0.6-0.8; equivocal 0.8-0.9; abnormal >= 0.9) Bladder: Normal for degree of bladder distention. The ureteral jet was not visualized. No bladder wall thickening or stone. Other findings:  Small amount of right lower quadrant ascites. IMPRESSION: 1. Mild to moderate hydronephrosis of the right lower quadrant renal transplant. Ureteral jet is not seen. Etiology is indeterminate, no urolithiasis is visualized. 2. Elevated intrarenal and main renal artery resistive indices. 3. Patent renal vein. 4. Small amount of right lower quadrant ascites. Electronically Signed   By: MKeith RakeM.D.   On: 01/05/2021 20:32   DG Chest Port 1 View  Result Date: 01/05/2021 CLINICAL DATA:  Shortness of breath, PleurX catheters EXAM: PORTABLE CHEST 1 VIEW COMPARISON:   Portable exam 1655 hours compared to 01/01/2021 FINDINGS: RIGHT PleurX catheter present. LEFT PleurX catheter not definitely visualized. Normal heart size mediastinal contours post TAVR. Bibasilar atelectasis versus infiltrates, increased. No significant pleural effusion, pneumothorax or acute osseous findings. IMPRESSION: Bibasilar atelectasis versus infiltrates increased since previous exam, greater on LEFT. Electronically Signed   By: MLavonia DanaM.D.   On: 01/05/2021 17:03   . aspirin  81 mg Oral Daily  . atorvastatin  10 mg Oral Daily  . carvedilol  3.125 mg Oral BID WC  . clopidogrel  75 mg Oral Daily  . docusate sodium  100 mg Oral Daily  . doxycycline  100 mg Oral Q12H  . fenofibrate  160 mg Oral Daily  . furosemide  40 mg Intravenous BID  . glyBURIDE  5 mg Oral BID WC  . hydrALAZINE  50 mg Oral TID  . insulin aspart  0-9 Units Subcutaneous TID WC  . insulin detemir  10 Units Subcutaneous Daily  . mycophenolate  360 mg Oral BID  . pantoprazole  40 mg Oral Daily  . sodium bicarbonate  650 mg Oral BID  . sodium chloride flush  3 mL Intravenous Q12H  . tacrolimus  1 mg Oral BID    BMET    Component  Value Date/Time   NA 132 (L) 01/06/2021 0240   NA 139 11/10/2020 1137   K 4.2 01/06/2021 0240   CL 102 01/06/2021 0240   CO2 16 (L) 01/06/2021 0240   GLUCOSE 175 (H) 01/06/2021 0240   BUN 77 (H) 01/06/2021 0240   BUN 31 (H) 11/10/2020 1137   CREATININE 4.72 (H) 01/06/2021 0240   CREATININE 1.99 (H) 07/09/2020 1155   CALCIUM 7.7 (L) 01/06/2021 0240   CALCIUM 7.6 (L) 07/12/2007 0400   GFRNONAA 13 (L) 01/06/2021 0240   GFRNONAA 61 10/03/2013 1432   GFRAA 53 (L) 09/16/2020 1404   GFRAA 71 10/03/2013 1432   CBC    Component Value Date/Time   WBC 4.9 01/05/2021 1550   RBC 3.30 (L) 01/05/2021 1550   HGB 8.7 (L) 01/05/2021 1550   HCT 26.7 (L) 01/05/2021 1550   PLT 105 (L) 01/05/2021 1550   MCV 80.9 01/05/2021 1550   MCV 85.9 10/03/2013 1354   MCH 26.4 01/05/2021 1550   MCHC  32.6 01/05/2021 1550   RDW 17.0 (H) 01/05/2021 1550   LYMPHSABS 0.4 (L) 01/05/2021 1550   MONOABS 0.4 01/05/2021 1550   EOSABS 0.1 01/05/2021 1550   BASOSABS 0.0 01/05/2021 1550   HPI: ORBRA ARNOTT is an 65 y.o. 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  Followed by Dr. [name] and baseline Scr 2-2.4 (last 2.4 in April 2022)  Assessment/Plan:  1. ESRD s/p LRD kidney transplant 15 years ago complicated by AKI/CKD stage IIIb - in setting of decompensated CHF.  [place] with some hydro to transplanted kidney.  Possibly cardiorenal syndrome (supported by low UNa), but also concerning for nephrotic syndrome.  Will obtain 24 hour urine protein.  May need transfer to [facility] for transplant kidney biopsy.  2. Acute hypoxic respiratory failure - secondary to recurrent pleural effusions. 3. Acute on chronic CHF - ECHO in 3/22 showed preserved EF with grade II DD and mildly increased gradient across TAVR.   1. Increase furosemide to 80 mg IV bid 2. Recommend repeat ECHO to evaluate EF now that he has pulmonary edema as well as anasarca.   3. Will also order 24 hour urine protein to r/o nephrotic syndrome as cause of anasarca.   4. Immunosuppression - continue with home dose of prograf and myfortic  5. Low grade fever - in immunocompromised patient.  resp panel negative, given IV vanc yesterday for possible PNA.  Cultures pending. 6. Anemia of CKD and iron deficiency - low TSAT will order IV feraheme and follow. 7. Hyponatremia - mild in setting of volume overload.  Follow with diuresis. 8. Recurrent bilateral pleural effusions - s/p bilateral PleurX catheters on 01/01/21.  Drain per primary svc. 9. HTN - stable 10. DM  - per primary 11. Thrombocytopenia - chronic issue  Donetta Potts, MD [facility] [phone]

## 2021-01-06 NOTE — Progress Notes (Signed)
Physical Therapy Treatment Patient Details Name: Steven [name] MRN: [id number] DOB: 1956-03-03 Today's Date: 01/06/2021    History of Present Illness Pt is a 65 y.o. male presenting on 01/05/2021 with SOB and low-grade fever. During ED course, pt in acute respiratory distress. CT chest reveals pulmonary edema and pleural effusion R>L. 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   PT Comments    Pt seen for additional session per request of nursing staff, having difficulty standing from recliner. Session focused on transfer training from low surface height, pt requiring min guard for balance to stand and walk short ambulation distance with RW. Pt with significant SOB with minimal activity, DOE 3-4/4 requiring seated rest; VSS on 2L O2. RN present and aware. Pt declining post-acute rehab, reports having necessary assist available upon return home. Continue to recommend HHPT; will follow acutely to address established goals.     Follow Up Recommendations  Home health PT;Supervision for mobility/OOB     Equipment Recommendations  None recommended by PT    Recommendations for Other Services       Precautions / Restrictions Precautions Precautions: Fall;Other (comment) Precaution Comments: H/o L BKA (prosthetic in room) Restrictions Weight Bearing Restrictions: No    Mobility  Bed Mobility Overal bed mobility: Needs Assistance Bed Mobility: Sit to Supine       Sit to supine: Mod assist   General bed mobility comments: ModA for BLE management return to supine; pt able to reposition self with BUE support on bed rails; poor tolerance to laying flat    Transfers Overall transfer level: Needs assistance Equipment used: Rolling walker (2 wheeled) Transfers: Sit to/from Stand Sit to Stand: Min guard         General transfer comment: Pt initially unable to  stand with nursing staff; cues for hand placement and sequencing, pt reliant on momentum to power into standing from low recliner height, min guard for safety, heavy reliance on UE support  Ambulation/Gait Ambulation/Gait assistance: Min guard Gait Distance (Feet): 4 Feet Assistive device: Rolling walker (2 wheeled) Gait Pattern/deviations: Step-through pattern;Decreased stride length;Trunk flexed Gait velocity: Decreased   General Gait Details: Short ambulation distance with RW and min guard, DOE 3/4 requiring seated rest; pt declined further distance   Stairs             Wheelchair Mobility    Modified Rankin (Stroke Patients Only)       Balance Overall balance assessment: Needs assistance Sitting-balance support: Feet supported;Bilateral upper extremity supported;No upper extremity supported Sitting balance-Leahy Scale: Fair Sitting balance - Comments: declined doffing shoes or LLE prosthetic before return to supine   Standing balance support: Bilateral upper extremity supported;During functional activity Standing balance-Leahy Scale: Poor Standing balance comment: Reliant on UE support                            Cognition Arousal/Alertness: Awake/alert Behavior During Therapy: WFL for tasks assessed/performed Overall Cognitive Status: Within Functional Limits for tasks assessed                                        Exercises      General Comments General comments (skin integrity, edema, etc.): DOE 3/4 with minimal activity, cues for pursed lip breathing; VSS on  2L O2 [place]. Pt with increased difficulty mobilizing; discussed safe d/c plan and available assist; pt declines post-acute rehab, reports having necessary assist available upon return home      Pertinent Vitals/Pain Pain Assessment: No/denies pain    Home Living                      Prior Function            PT Goals (current goals can now be found in the care  plan section) Progress towards PT goals: Progressing toward goals    Frequency    Min 3X/week      PT Plan Current plan remains appropriate    Co-evaluation              AM-PAC PT "6 Clicks" Mobility   Outcome Measure  Help needed turning from your back to your side while in a flat bed without using bedrails?: A Little Help needed moving from lying on your back to sitting on the side of a flat bed without using bedrails?: A Lot Help needed moving to and from a bed to a chair (including a wheelchair)?: A Little Help needed standing up from a chair using your arms (e.g., wheelchair or bedside chair)?: A Little Help needed to walk in hospital room?: A Little Help needed climbing 3-5 steps with a railing? : A Lot 6 Click Score: 16    End of Session Equipment Utilized During Treatment: Gait belt;Oxygen Activity Tolerance: Patient tolerated treatment well;Patient limited by fatigue Patient left: in bed;with call bell/phone within reach;with bed alarm set;with nursing/sitter in room Nurse Communication: Mobility status PT Visit Diagnosis: Other abnormalities of gait and mobility (R26.89);Muscle weakness (generalized) (M62.81);Difficulty in walking, not elsewhere classified (R26.2)     Time: [id number] PT Time Calculation (min) (ACUTE ONLY): 15 min  Charges:  $Therapeutic Activity: 8-22 mins                     Mabeline Caras, PT, DPT Acute Rehabilitation Services  Pager [phone] Office [place] 01/06/2021, 4:52 PM

## 2021-01-06 NOTE — Consult Note (Signed)
[facility] for [facility]ious [facility]    Date of Admission:  01/05/2021   Total days of antibiotics 2               Reason for Consult: Enterococcus faecalis bacteremia    Referring Provider: Eleonore Chiquito, MD Primary Care Provider: Lelon Frohlich, MD  Assessment: Patient with severe AI status post TAVR, CKD 4 status post renal transplant, presented to the hospital for worsening shortness of breath and to have bilateral pleural effusion.  He has had bilateral Pleurx placed last week.  His pleural effusion is thought due to CHF exacerbation vs nephrotic syndrome.  He also found to have Enterococcus faecalis uremia grew in both aerobic and anaerobic bottle.  Sensitivity pending.  The source of infection can to be respiratory or endocarditis.  No evidence of urinary or diabetic foot wound source.  Will start ampicillin and ceftriaxone for double coverage in suspicion of endocarditis.  Will discontinue doxycycline.  Ordered TTE.  Plan: 1. Start ampicillin and ceftriaxone 2. Stop doxycycline 3. Order TTE 4. Follow-up on blood culture susceptibility  Active Problems:   AKI (acute kidney injury) ([place])   Acute on chronic diastolic CHF (congestive heart failure) ([facility])   CHF (congestive heart failure) ([facility])   Scheduled Meds: . aspirin  81 mg Oral Daily  . atorvastatin  10 mg Oral Daily  . carvedilol  3.125 mg Oral BID WC  . clopidogrel  75 mg Oral Daily  . docusate sodium  100 mg Oral Daily  . doxycycline  100 mg Oral Q12H  . fenofibrate  160 mg Oral Daily  . furosemide  80 mg Intravenous BID  . glyBURIDE  5 mg Oral BID WC  . hydrALAZINE  50 mg Oral TID  . insulin aspart  0-9 Units Subcutaneous TID WC  . insulin detemir  10 Units Subcutaneous Daily  . mycophenolate  360 mg Oral BID  . pantoprazole  40 mg Oral Daily  . sodium bicarbonate  650 mg Oral BID  . sodium chloride flush  3 mL Intravenous Q12H  . tacrolimus  1 mg Oral BID   Continuous Infusions: . sodium  chloride    . albumin human 25 g (01/06/21 1045)  . ampicillin (OMNIPEN) IV    . cefTRIAXone (ROCEPHIN)  IV    . ferric gluconate (FERRLECIT) IVPB     PRN Meds:.sodium chloride, acetaminophen, acetaminophen, cyclobenzaprine, labetalol, ondansetron (ZOFRAN) IV, sodium chloride flush, traMADol, traZODone  HPI: Steven [name] is a 65 y.o. male with past medical history of CKD 4 status post renal transplant 16 years ago due to heavy NSAID use, severe aortic insufficiency status post TAVR 09/2020, PVD status post left BKA who presented to the hospital for worsening shortness of breath, found to have bilateral pleural effusion.  He has bilateral Pleurx in place.  Patient states that he is feeling better today improvement of breathing.  States that he has had fluid removed from his lungs 3 days ago, 800 cc on the right and 900 cc on the left.  Denies any dysuria, cough, sore throat.  Denies diabetic foot wound.   Review of Systems: Review of Systems  Constitutional: Negative for chills and fever.  Respiratory: Negative for cough and shortness of breath.   Cardiovascular: Positive for leg swelling.  Gastrointestinal: Negative for abdominal pain.  Genitourinary: Negative for dysuria.    Past Medical History:  Diagnosis Date  . Bursitis    left elbow  .  Cataract    removed by surgery  . Constipation due to opioid therapy    and decreased ambulation  . Diabetes ([place])    type 2  . 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 Corliss Parish., Kidney Transplant  . GERD (gastroesophageal reflux disease)   . HLD (hyperlipidemia)   . Hyperparathyroidism ([place])   . Hypertension    Patient denies this DX, no meds  . Kidney transplant recipient   . Obesity   . Pneumonia   . 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 Sleep apnea    uses CPAP nightly  .  Venous insufficiency   . Vitamin D deficiency     Social History   Tobacco Use  . Smoking status: Never Smoker  . Smokeless tobacco: Former Systems developer    Types: Secondary school teacher  . Vaping Use: Never used  Substance Use Topics  . Alcohol use: No  . Drug use: No    Family History  Problem Relation Age of Onset  . Healthy Mother   . Healthy Father   . Colon cancer Neg Hx   . Rectal cancer Neg Hx   . Stomach cancer Neg Hx    Allergies  Allergen Reactions  . Sulfa Antibiotics Rash    OBJECTIVE: Blood pressure 130/68, pulse 89, temperature 98 F (36.7 C), temperature source Oral, resp. rate 18, height '5\' 11"'$  (1.803 m), weight 118.2 kg, SpO2 99 %.  Physical Exam Constitutional:      General: He is not in acute distress.    Appearance: He is obese. He is ill-appearing.  HENT:     Head: Normocephalic.  Eyes:     General: No scleral icterus.       Right eye: No discharge.        Left eye: No discharge.     Conjunctiva/sclera: Conjunctivae normal.  Cardiovascular:     Rate and Rhythm: Normal rate and regular rhythm.     Heart sounds: Normal heart sounds. No murmur heard.     Comments: +1 right lower extremity edema Pulmonary:     Effort: Pulmonary effort is normal.     Comments: Diminished breath sound bilateral lung bases.  No wheezes or rales Abdominal:     General: Bowel sounds are normal. There is distension.     Comments: +1 edema of the abdominal wall  Musculoskeletal:     Cervical back: Normal range of motion and neck supple.     Comments: Left BKA. No open wound or ulcer of right foot.  Chronic venous stasis dermatosis  Skin:    General: Skin is warm.  Neurological:     Mental Status: He is alert and oriented to person, place, and time. Mental status is at baseline.  Psychiatric:        Mood and Affect: Mood normal.        Thought Content: Thought content normal.        Judgment: Judgment normal.     Lab Results Lab Results  Component Value Date   WBC  4.9 01/05/2021   HGB 8.7 (L) 01/05/2021   HCT 26.7 (L) 01/05/2021   MCV 80.9 01/05/2021   PLT 105 (L) 01/05/2021    Lab Results  Component Value Date   CREATININE 4.72 (H) 01/06/2021   BUN 77 (H) 01/06/2021   NA 132 (  L) 01/06/2021   K 4.2 01/06/2021   CL 102 01/06/2021   CO2 16 (L) 01/06/2021    Lab Results  Component Value Date   ALT 12 01/05/2021   AST 18 01/05/2021   ALKPHOS 129 (H) 01/05/2021   BILITOT 0.5 01/05/2021     Microbiology: Recent Results (from the past 240 hour(s))  Surgical pcr screen     Status: Abnormal   Collection Time: 12/31/20  8:46 AM   Specimen: Nasal Mucosa; Nasal Swab  Result Value Ref Range Status   MRSA, PCR NEGATIVE NEGATIVE Final   Staphylococcus aureus POSITIVE (A) NEGATIVE Final    Comment: (NOTE) The Xpert SA Assay (FDA approved for NASAL specimens in patients 52 years of age and older), is one component of a comprehensive surveillance program. It is not intended to diagnose infection nor to guide or monitor treatment. Performed at [facility], [place] [address], [place], [place] [address]   Culture, blood (x 2)     Status: None (Preliminary result)   Collection Time: 01/05/21  4:36 PM   Specimen: BLOOD LEFT FOREARM  Result Value Ref Range Status   Specimen Description BLOOD LEFT FOREARM  Final   Special Requests   Final    BOTTLES DRAWN AEROBIC AND ANAEROBIC Blood Culture adequate volume   Culture  Setup Time   Final    GRAM POSITIVE COCCI IN BOTH AEROBIC AND ANAEROBIC BOTTLES CRITICAL RESULT CALLED TO, READ BACK BY AND VERIFIED WITH: PHARMD JEREMY FRENS AT [id number] [place] ON 01/06/21 BY KJ Performed at [facility], [place] [address] [place], [place] [address]    Culture [facility] POSITIVE COCCI  Final   Report Status PENDING  Incomplete  Blood Culture ID Panel (Reflexed)     Status: Abnormal   Collection Time: 01/05/21  4:36 PM  Result Value Ref Range Status   Enterococcus faecalis DETECTED (A) NOT DETECTED Final    Comment:  CRITICAL RESULT CALLED TO, READ BACK BY AND VERIFIED WITH: PHARMD JEREMY FRENS AT [id number] ON 6/85/22 BY KJ    Enterococcus Faecium NOT DETECTED NOT DETECTED Corrected    Comment: CORRECTED ON 06/08 AT 1234: PREVIOUSLY REPORTED AS NOT DETECTED CRITICAL RESULT CALLED TO, READ BACK BY AND VERIFIED WITH: PHARMD JEREMY FRENS AT [id number] ON 6/85/22 BY KJ   Listeria monocytogenes NOT DETECTED NOT DETECTED Final   Staphylococcus species NOT DETECTED NOT DETECTED Final   Staphylococcus aureus (BCID) NOT DETECTED NOT DETECTED Final   Staphylococcus epidermidis NOT DETECTED NOT DETECTED Final   Staphylococcus lugdunensis NOT DETECTED NOT DETECTED Final   Streptococcus species NOT DETECTED NOT DETECTED Final   Streptococcus agalactiae NOT DETECTED NOT DETECTED Final   Streptococcus pneumoniae NOT DETECTED NOT DETECTED Final   Streptococcus pyogenes NOT DETECTED NOT DETECTED Final   A.calcoaceticus-baumannii NOT DETECTED NOT DETECTED Final   Bacteroides fragilis NOT DETECTED NOT DETECTED Final   Enterobacterales NOT DETECTED NOT DETECTED Final   Enterobacter cloacae complex NOT DETECTED NOT DETECTED Final   Escherichia coli NOT DETECTED NOT DETECTED Final   Klebsiella aerogenes NOT DETECTED NOT DETECTED Final   Klebsiella oxytoca NOT DETECTED NOT DETECTED Final   Klebsiella pneumoniae NOT DETECTED NOT DETECTED Final   Proteus species NOT DETECTED NOT DETECTED Final   Salmonella species NOT DETECTED NOT DETECTED Final   Serratia marcescens NOT DETECTED NOT DETECTED Final   Haemophilus influenzae NOT DETECTED NOT DETECTED Final   Neisseria meningitidis NOT DETECTED NOT DETECTED Final   Pseudomonas aeruginosa NOT  DETECTED NOT DETECTED Final   Stenotrophomonas maltophilia NOT DETECTED NOT DETECTED Final   Candida albicans NOT DETECTED NOT DETECTED Final   Candida auris NOT DETECTED NOT DETECTED Final   Candida glabrata NOT DETECTED NOT DETECTED Final   Candida krusei NOT DETECTED NOT DETECTED Final    Candida parapsilosis NOT DETECTED NOT DETECTED Final   Candida tropicalis NOT DETECTED NOT DETECTED Final   Cryptococcus neoformans/gattii NOT DETECTED NOT DETECTED Final   Vancomycin resistance NOT DETECTED NOT DETECTED Final    Comment: Performed at [facility], [place] [address] [place], [place] [address]  Culture, blood (x 2)     Status: None (Preliminary result)   Collection Time: 01/05/21  7:26 PM   Specimen: BLOOD  Result Value Ref Range Status   Specimen Description BLOOD SITE NOT SPECIFIED  Final   Special Requests   Final    BOTTLES DRAWN AEROBIC AND ANAEROBIC Blood Culture results may not be optimal due to an inadequate volume of blood received in culture bottles   Culture   Final    NO GROWTH < 12 HOURS Performed at [facility], [place] [address] [place],  [address]    Report Status PENDING  Incomplete  Resp Panel by RT-PCR (Flu A&B, Covid) Nasopharyngeal Swab     Status: None   Collection Time: 01/05/21  8:57 PM   Specimen: Nasopharyngeal Swab; Nasopharyngeal(NP) swabs in vial transport medium  Result Value Ref Range Status   SARS Coronavirus 2 by RT PCR NEGATIVE NEGATIVE Final    Comment: (NOTE) SARS-CoV-2 target nucleic acids are NOT DETECTED.  The SARS-CoV-2 RNA is generally detectable in upper respiratory specimens during the acute phase of infection. The lowest concentration of SARS-CoV-2 viral copies this assay can detect is 138 copies/mL. A negative result does not preclude SARS-Cov-2 infection and should not be used as the sole basis for treatment or other patient management decisions. A negative result may occur with  improper specimen collection/handling, submission of specimen other than nasopharyngeal swab, presence of viral mutation(s) within the areas targeted by this assay, and inadequate number of viral copies(<138 copies/mL). A negative result must be combined with clinical observations, patient history, and  epidemiological information. The expected result is Negative.  Fact Sheet for Patients:  EntrepreneurPulse.com.au  Fact Sheet for Healthcare Providers:  IncredibleEmployment.be  This test is no t yet approved or cleared by the [place] FDA and  has been authorized for detection and/or diagnosis of SARS-CoV-2 by FDA under an Emergency Use Authorization (EUA). This EUA will remain  in effect (meaning this test can be used) for the duration of the COVID-19 declaration under Section 564(b)(1) of the Act, 21 U.S.C.section 360bbb-3(b)(1), unless the authorization is terminated  or revoked sooner.       Influenza A by PCR NEGATIVE NEGATIVE Final   Influenza B by PCR NEGATIVE NEGATIVE Final    Comment: (NOTE) The Xpert Xpress SARS-CoV-2/FLU/RSV plus assay is intended as an aid in the diagnosis of influenza from Nasopharyngeal swab specimens and should not be used as a sole basis for treatment. Nasal washings and aspirates are unacceptable for Xpert Xpress SARS-CoV-2/FLU/RSV testing.  Fact Sheet for Patients: EntrepreneurPulse.com.au  Fact Sheet for Healthcare Providers: IncredibleEmployment.be  This test is not yet approved or cleared by the [place] FDA and has been authorized for detection and/or diagnosis of SARS-CoV-2 by FDA under an Emergency Use Authorization (EUA). This EUA will remain in effect (meaning this test can be used) for  the duration of the COVID-19 declaration under Section 564(b)(1) of the Act, 21 U.S.C. section 360bbb-3(b)(1), unless the authorization is terminated or revoked.  Performed at [facility], [place] [address] [place], [place] [address]   Respiratory (~20 pathogens) panel by PCR     Status: None   Collection Time: 01/05/21 11:26 PM  Result Value Ref Range Status   Adenovirus NOT DETECTED NOT DETECTED Final   Coronavirus 229E NOT DETECTED NOT DETECTED Final     Comment: (NOTE) The Coronavirus on the Respiratory Panel, DOES NOT test for the novel  Coronavirus (2019 nCoV)    Coronavirus HKU1 NOT DETECTED NOT DETECTED Final   Coronavirus NL63 NOT DETECTED NOT DETECTED Final   Coronavirus OC43 NOT DETECTED NOT DETECTED Final   Metapneumovirus NOT DETECTED NOT DETECTED Final   Rhinovirus / Enterovirus NOT DETECTED NOT DETECTED Final   Influenza A NOT DETECTED NOT DETECTED Final   Influenza B NOT DETECTED NOT DETECTED Final   Parainfluenza Virus 1 NOT DETECTED NOT DETECTED Final   Parainfluenza Virus 2 NOT DETECTED NOT DETECTED Final   Parainfluenza Virus 3 NOT DETECTED NOT DETECTED Final   Parainfluenza Virus 4 NOT DETECTED NOT DETECTED Final   Respiratory Syncytial Virus NOT DETECTED NOT DETECTED Final   Bordetella pertussis NOT DETECTED NOT DETECTED Final   Bordetella Parapertussis NOT DETECTED NOT DETECTED Final   Chlamydophila pneumoniae NOT DETECTED NOT DETECTED Final   Mycoplasma pneumoniae NOT DETECTED NOT DETECTED Final    Comment: Performed at [facility], [place]. [address], [place], [place] [address]    Gaylan Gerold, [facility] [phone] pager   [phone] cell 01/06/2021, 2:21 PM

## 2021-01-06 NOTE — Progress Notes (Signed)
24 H urine test started at 1700. Spoke with Dr. Marval Regal and made MD aware that this is patient's first void since 0100 and asked if this urine should be thrown out and the next void collected. MD instructed RN to keep this urine and start 24 H urine collection now with this void.

## 2021-01-06 NOTE — Progress Notes (Signed)
Dr. Darrick Meigs called this RN and gave verbal order to give the scheduled IV lasix at this time. MD stated it was ok to hold this evenings coreg and that he d/c hydralazine.

## 2021-01-06 NOTE — Plan of Care (Signed)
  Problem: Activity: Goal: Capacity to carry out activities will improve Outcome: Progressing   Problem: Clinical Measurements: Goal: Respiratory complications will improve Outcome: Progressing   

## 2021-01-06 NOTE — Evaluation (Signed)
Physical Therapy Evaluation Patient Details Name: Steven [name] MRN: [id number] DOB: 1955-10-05 Today's Date: 01/06/2021   History of Present Illness  Pt is a 65 y/o male presenting on 01/05/2021 with increasing shortness of breath and low-grade fever. During ED course, pt in acute respiratory distress. CT chest reveals pulmonary edema and pleural effusion R>L. PMH includes chronic diastolic CHF, recurrent bilateral pleural effusion transudate status post Pleurx recently, CKD stage IV, severe AI status post TAVR February 2022, HTN, IDDM, PVD status post left BKA.  Clinical Impression  Pt demonstrates ability to perform bed mobility, requiring physical assistance with the [facility] elevated. Pt performs transfers and ambulates for a short distance without the need for physical assistance. Pt declines further mobility, due to reports of fatigue. Pt demonstrates deficits in strength, endurance, power, and activity tolerance and will benefit from acute PT to improve safe and independent mobility. SPT recommends HHPT to improve activity tolerance and implement a gait program.     Follow Up Recommendations Home health PT    Equipment Recommendations  None recommended by PT    Recommendations for Other Services       Precautions / Restrictions Precautions Precautions: Fall Restrictions Weight Bearing Restrictions: No      Mobility  Bed Mobility Overal bed mobility: Needs Assistance Bed Mobility: Supine to Sit     Supine to sit: Min assist;HOB elevated     General bed mobility comments: min A for trunk    Transfers Overall transfer level: Needs assistance Equipment used: Rolling walker (2 wheeled) Transfers: Sit to/from Stand Sit to Stand: Min guard;From elevated surface         General transfer comment: min G for safety  Ambulation/Gait Ambulation/Gait assistance: Min guard Gait Distance (Feet): 6 Feet Assistive device: Rolling walker (2 wheeled) Gait Pattern/deviations:  Step-through pattern;Decreased stride length Gait velocity: decreased Gait velocity interpretation: 1.31 - 2.62 ft/sec, indicative of limited community ambulator General Gait Details: min G for safety for short distance to recliner. Pt declines further ambulation at this time secondary to not having a bowel movement recently and reports of having  a rough day yesterday.  Stairs            Wheelchair Mobility    Modified Rankin (Stroke Patients Only)       Balance Overall balance assessment: Needs assistance Sitting-balance support: Feet supported;Bilateral upper extremity supported;No upper extremity supported Sitting balance-Leahy Scale: Fair Sitting balance - Comments: Pt requires assistance to don sock and shoe, able to don prosthetic independently.   Standing balance support: Bilateral upper extremity supported;During functional activity Standing balance-Leahy Scale: Poor                               Pertinent Vitals/Pain Pain Assessment: No/denies pain    Home Living Family/patient expects to be discharged to:: Private residence Living Arrangements: Spouse/significant other;Children Available Help at Discharge: Family;Available 24 hours/day Type of Home: House Home Access: Level entry     Home Layout: One level Home Equipment: Bedside commode;Shower seat;Walker - 2 wheels;Cane - single point;Wheelchair - manual      Prior Function Level of Independence: Independent with assistive device(s)         Comments: Pt reports completing ADLs independently majority of time. Pt at times requires assistance to don socks/shoes depending on LE swelling.     Hand Dominance        Extremity/Trunk Assessment   Upper Extremity Assessment Upper Extremity  Assessment: Overall WFL for tasks assessed    Lower Extremity Assessment Lower Extremity Assessment: Generalized weakness    Cervical / Trunk Assessment Cervical / Trunk Assessment: Normal   Communication   Communication: No difficulties  Cognition Arousal/Alertness: Awake/alert Behavior During Therapy: WFL for tasks assessed/performed Overall Cognitive Status: Within Functional Limits for tasks assessed                                        General Comments General comments (skin integrity, edema, etc.): On arrival, pt on 2 L [place] satting at 96%, HR 91, and with RRf luctuating between  23-34 at rest. Pt weaned to RA and desat to 89%. Pt placed on 1 L [place] and satting at 91%. Pt placed on 2 L [place] with oxygen sat levels between 93-96%.    Exercises     Assessment/Plan    PT Assessment Patient needs continued PT services  PT Problem List Decreased strength;Decreased activity tolerance;Decreased balance;Decreased mobility       PT Treatment Interventions Gait training;Functional mobility training;Therapeutic activities;Therapeutic exercise;Balance training;Patient/family education    PT Goals (Current goals can be found in the Care Plan section)  Acute Rehab PT Goals Patient Stated Goal: Get better and go home PT Goal Formulation: With patient Time For Goal Achievement: 01/20/21 Potential to Achieve Goals: Good    Frequency Min 3X/week   Barriers to discharge        Co-evaluation               AM-PAC PT "6 Clicks" Mobility  Outcome Measure Help needed turning from your back to your side while in a flat bed without using bedrails?: A Little Help needed moving from lying on your back to sitting on the side of a flat bed without using bedrails?: A Little Help needed moving to and from a bed to a chair (including a wheelchair)?: A Little Help needed standing up from a chair using your arms (e.g., wheelchair or bedside chair)?: A Little Help needed to walk in hospital room?: A Little Help needed climbing 3-5 steps with a railing? : A Lot 6 Click Score: 17    End of Session Equipment Utilized During Treatment: Gait belt;Oxygen Activity  Tolerance: Patient limited by fatigue Patient left: in chair;with call bell/phone within reach Nurse Communication: Mobility status PT Visit Diagnosis: Other abnormalities of gait and mobility (R26.89);Muscle weakness (generalized) (M62.81);Difficulty in walking, not elsewhere classified (R26.2)    Time: [id number] PT Time Calculation (min) (ACUTE ONLY): 35 min   Charges:   PT Evaluation $PT Eval Low Complexity: 1 Low          Acute Rehab  Pager: [phone]   Garwin Brothers, SPT  01/06/2021, 12:47 PM

## 2021-01-07 ENCOUNTER — Telehealth: Payer: Self-pay

## 2021-01-07 ENCOUNTER — Inpatient Hospital Stay (HOSPITAL_COMMUNITY): Payer: BC Managed Care – PPO

## 2021-01-07 DIAGNOSIS — R7881 Bacteremia: Secondary | ICD-10-CM

## 2021-01-07 LAB — HEMOGLOBIN AND HEMATOCRIT, BLOOD
HCT: 22.9 % — ABNORMAL LOW (ref 39.0–52.0)
Hemoglobin: 7.3 g/dL — ABNORMAL LOW (ref 13.0–17.0)

## 2021-01-07 LAB — PHOSPHORUS: Phosphorus: 5.4 mg/dL — ABNORMAL HIGH (ref 2.5–4.6)

## 2021-01-07 LAB — CBC
HCT: 20.3 % — ABNORMAL LOW (ref 39.0–52.0)
Hemoglobin: 6.4 g/dL — CL (ref 13.0–17.0)
MCH: 26.1 pg (ref 26.0–34.0)
MCHC: 31.5 g/dL (ref 30.0–36.0)
MCV: 82.9 fL (ref 80.0–100.0)
Platelets: 98 10*3/uL — ABNORMAL LOW (ref 150–400)
RBC: 2.45 MIL/uL — ABNORMAL LOW (ref 4.22–5.81)
RDW: 17.3 % — ABNORMAL HIGH (ref 11.5–15.5)
WBC: 1.6 10*3/uL — ABNORMAL LOW (ref 4.0–10.5)
nRBC: 1.3 % — ABNORMAL HIGH (ref 0.0–0.2)

## 2021-01-07 LAB — GLUCOSE, CAPILLARY
Glucose-Capillary: 123 mg/dL — ABNORMAL HIGH (ref 70–99)
Glucose-Capillary: 154 mg/dL — ABNORMAL HIGH (ref 70–99)
Glucose-Capillary: 160 mg/dL — ABNORMAL HIGH (ref 70–99)
Glucose-Capillary: 137 mg/dL — ABNORMAL HIGH (ref 70–99)

## 2021-01-07 LAB — COMPREHENSIVE METABOLIC PANEL
ALT: 15 U/L (ref 0–44)
AST: 21 U/L (ref 15–41)
Albumin: 2.5 g/dL — ABNORMAL LOW (ref 3.5–5.0)
Alkaline Phosphatase: 92 U/L (ref 38–126)
Anion gap: 14 (ref 5–15)
BUN: 80 mg/dL — ABNORMAL HIGH (ref 8–23)
CO2: 21 mmol/L — ABNORMAL LOW (ref 22–32)
Calcium: 8.2 mg/dL — ABNORMAL LOW (ref 8.9–10.3)
Chloride: 102 mmol/L (ref 98–111)
Creatinine, Ser: 5.32 mg/dL — ABNORMAL HIGH (ref 0.61–1.24)
GFR, Estimated: 11 mL/min — ABNORMAL LOW (ref >60)
Glucose, Bld: 143 mg/dL — ABNORMAL HIGH (ref 70–99)
Potassium: 4.6 mmol/L (ref 3.5–5.1)
Sodium: 137 mmol/L (ref 135–145)
Total Bilirubin: 0.6 mg/dL (ref 0.3–1.2)
Total Protein: 4.9 g/dL — ABNORMAL LOW (ref 6.5–8.1)

## 2021-01-07 LAB — PROTEIN, URINE, 24 HOUR
Collection Interval-UPROT: 24 hours
Protein, 24H Urine: 6146 mg/d — ABNORMAL HIGH (ref 50–100)
Protein, Urine: 878 mg/dL
Urine Total Volume-UPROT: 700 mL

## 2021-01-07 LAB — ECHOCARDIOGRAM COMPLETE
AR max vel: 1.87 cm2
AV Area VTI: 2.01 cm2
AV Area mean vel: 1.78 cm2
AV Mean grad: 12 mmHg
AV Peak grad: 17.5 mmHg
Ao pk vel: 2.09 m/s
Area-P 1/2: 3.5 cm2
MV VTI: 1.73 cm2
S' Lateral: 3.5 cm

## 2021-01-07 LAB — UREA NITROGEN, URINE: Urea Nitrogen, Ur: 460 mg/dL

## 2021-01-07 LAB — PROTEIN / CREATININE RATIO, URINE
Creatinine, Urine: 88.36 mg/dL
Protein Creatinine Ratio: 10.82 mg/mg{Cre} — ABNORMAL HIGH (ref 0.00–0.15)
Total Protein, Urine: 956 mg/dL

## 2021-01-07 LAB — PREPARE RBC (CROSSMATCH)

## 2021-01-07 MED ORDER — ENSURE ENLIVE PO LIQD
237.0000 mL | Freq: Three times a day (TID) | ORAL | Status: DC
  Administered 2021-01-07 – 2021-01-13 (×18): 237 mL via ORAL

## 2021-01-07 MED ORDER — FUROSEMIDE 10 MG/ML IJ SOLN
120.0000 mg | Freq: Three times a day (TID) | INTRAVENOUS | Status: DC
  Administered 2021-01-07 – 2021-01-08 (×6): 120 mg via INTRAVENOUS
  Filled 2021-01-07: qty 10
  Filled 2021-01-07: qty 12
  Filled 2021-01-07: qty 2
  Filled 2021-01-07: qty 10
  Filled 2021-01-07 (×7): qty 12
  Filled 2021-01-07: qty 10

## 2021-01-07 MED ORDER — PERFLUTREN LIPID MICROSPHERE
1.0000 mL | INTRAVENOUS | Status: AC | PRN
  Administered 2021-01-07: 10 mL via INTRAVENOUS
  Filled 2021-01-07: qty 10

## 2021-01-07 MED ORDER — SODIUM CHLORIDE 0.9% IV SOLUTION: Freq: Once | INTRAVENOUS | Status: AC

## 2021-01-07 MED ORDER — ADULT MULTIVITAMIN W/MINERALS CH
1.0000 | ORAL_TABLET | Freq: Every day | ORAL | Status: DC
  Administered 2021-01-07 – 2021-01-14 (×8): 1 via ORAL
  Filled 2021-01-07 (×8): qty 1

## 2021-01-07 NOTE — Plan of Care (Signed)
  Problem: Clinical Measurements: Goal: Diagnostic test results will improve Outcome: Progressing   

## 2021-01-07 NOTE — Progress Notes (Signed)
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  After drainage patient started to have chest discomfort.  Yesterday home health RN noticed that patient was having increasing swelling of lower extremities so she did not drain the Pleurx catheter and asked him to go to the ED for further evaluation.  In the ED CT chest showed pulmonary edema and pleural effusion right more than left, worsening kidney function with creatinine 4.7 compared to 4.3, 3 days ago.  He was started on vancomycin and cefepime for possible HCAP. Antibiotics were discontinued as infection was not suspected by the admitting physician.   Subjective   Patient seen and examined, says there is some improvement in shortness of breath.   Assessment/Plan:     Acute on chronic diastolic CHF -Nephrology has increased Lasix to 120 mg 3 times daily -If no improvement, might require hemodialysis -We will obtain echocardiogram -Strict intake and output   Bilateral pleural effusion -Likely from underlying congestive heart failure -Has Pleurx catheter placed bilaterally -Continue  Pleurx catheter drainage every other day   Enterococcus bacteremia -ID has been consulted -Patient started on ceftriaxone, ampicillin -TTE has been ordered -We will follow the result   CKD stage IV -He has proteinuria -Nephrology consulted   Diabetes mellitus type 2 -We will hold glipizide -Continue  Levemir 10 units subcu daily -Sliding scale insulin with NovoLog  Scheduled medications:    aspirin  81 mg Oral Daily   atorvastatin  10 mg Oral Daily   carvedilol  3.125 mg Oral BID WC   clopidogrel  75 mg Oral Daily   docusate sodium  100 mg Oral Daily   feeding supplement  237 mL Oral TID BM   fenofibrate  160 mg Oral Daily   insulin aspart  0-9 Units Subcutaneous TID WC   insulin detemir  10 Units Subcutaneous Daily   multivitamin with minerals  1 tablet Oral Daily   mycophenolate  360 mg Oral BID   pantoprazole  40 mg Oral Daily   sodium bicarbonate  650 mg Oral BID   sodium chloride flush  3 mL Intravenous Q12H   tacrolimus  1 mg Oral BID         Data Reviewed:   CBG:  Recent Labs  Lab 01/06/21 1122 01/06/21 1647 01/06/21 2112 01/07/21 0558 01/07/21 1150  GLUCAP 214* 166* 175* 123* 154*    SpO2: 98 % O2 Flow Rate (L/min): 2 L/min    Vitals:   01/07/21 0756 01/07/21 1030 01/07/21 1045 01/07/21 1200  BP: (!) 143/64 (!) 142/67 (!) 143/60 (!) 124/56  Pulse: 90 88 86 82  Resp: (!) '22 18 20   '$ Temp: 98.1 F (36.7 C) 98.2 F (36.8 C) 98.4 F (36.9 C)   TempSrc: Oral Oral Oral   SpO2: 100% 98% 97% 98%  Weight:      Height:         Intake/Output Summary (Last 24 hours) at 01/07/2021 1304 Last data filed at 01/07/2021 0920 Gross per 24 hour  Intake  1679.76 ml  Output 1900 ml  Net -220.24 ml    06/07 1901 - 06/09 0700 In: 2362 [P.O.:1410; I.V.:4.6] Out: 1700 [Urine:600]  Filed Weights   01/05/21 1528 01/06/21 0103 01/07/21 0000  Weight: 111.1 kg 118.2 kg 117.3 kg    CBC:  Recent Labs  Lab 01/01/21 0531 01/05/21 1550 01/07/21 0527  WBC 3.3* 4.9 1.6*  HGB 8.9* 8.7* 6.4*  HCT 28.0* 26.7* 20.3*  PLT 59* 105* 98*  MCV 83.3 80.9 82.9  MCH 26.5 26.4 26.1  MCHC 31.8 32.6 31.5  RDW 18.1* 17.0* 17.3*  LYMPHSABS  --  0.4*  --   MONOABS  --  0.4  --   EOSABS  --  0.1  --   BASOSABS  --  0.0  --     Complete metabolic panel:  Recent Labs   Lab 01/01/21 0531 01/05/21 1550 01/05/21 1636 01/05/21 1950 01/06/21 0240 01/07/21 0527  NA 136 130*  --   --  132* 137  K 3.3* 4.2  --   --  4.2 4.6  CL 107 102  --   --  102 102  CO2 20* 19*  --   --  16* 21*  GLUCOSE 34* 193*  --   --  175* 143*  BUN 54* 71*  --   --  77* 80*  CREATININE 4.43* 4.71*  --   --  4.72* 5.32*  CALCIUM 7.5* 7.3*  --   --  7.7* 8.2*  AST  --  18  --   --   --  21  ALT  --  12  --   --   --  15  ALKPHOS  --  129*  --   --   --  92  BILITOT  --  0.5  --   --   --  0.6  ALBUMIN  --  1.8*  --   --   --  2.5*  LATICACIDVEN  --   --  1.0 0.8  --   --   INR  --  1.2  --   --   --   --     No results for input(s): LIPASE, AMYLASE in the last 168 hours.  Recent Labs  Lab 01/05/21 2057  [place]    ------------------------------------------------------------------------------------------------------------------ Recent Labs    01/06/21 0240  CHOL 83  HDL 26*  LDLCALC 39  TRIG 92  CHOLHDL 3.2    Lab Results  Component Value Date   HGBA1C 5.2 12/31/2020   ------------------------------------------------------------------------------------------------------------------ No results for input(s): TSH, T4TOTAL, T3FREE, THYROIDAB in the last 72 hours.  Invalid input(s): FREET3 ------------------------------------------------------------------------------------------------------------------ Recent Labs    01/05/21 1930  FERRITIN 405*  TIBC 155*  IRON 14*    Coagulation profile Recent Labs  Lab 01/05/21 1550  INR 1.2   No results for input(s): DDIMER in the last 72 hours.  Cardiac Enzymes No results for input(s): CKTOTAL, CKMB, CKMBINDEX, TROPONINI in the last 168 hours.  ------------------------------------------------------------------------------------------------------------------    Component Value Date/Time   BNP 1,403.3 (H) 06/04/2020 [id number]     Antibiotics: Anti-infectives (From admission, onward)    Start      Dose/Rate Route Frequency Ordered Stop   01/06/21 1500  cefTRIAXone (ROCEPHIN) 2 g in sodium chloride 0.9 % 100 mL IVPB        2 g 200 mL/hr over 30 Minutes Intravenous Every 12 hours 01/06/21 1204     01/06/21 1400  ampicillin (OMNIPEN) 2 g in sodium  chloride 0.9 % 100 mL IVPB  Status:  Discontinued        2 g 300 mL/hr over 20 Minutes Intravenous Every 8 hours 01/06/21 1204 01/06/21 1205   01/06/21 1300  ampicillin (OMNIPEN) 2 g in sodium chloride 0.9 % 100 mL IVPB        2 g 300 mL/hr over 20 Minutes Intravenous Every 8 hours 01/06/21 1205     01/05/21 2200  doxycycline (VIBRA-TABS) tablet 100 mg  Status:  Discontinued        100 mg Oral Every 12 hours 01/05/21 1849 01/06/21 1433   01/05/21 1630  vancomycin (VANCOREADY) IVPB 2000 mg/400 mL        2,000 mg 200 mL/hr over 120 Minutes Intravenous  Once 01/05/21 1616 01/05/21 2015   01/05/21 1630  ceFEPIme (MAXIPIME) 2 g in sodium chloride 0.9 % 100 mL IVPB        2 g 200 mL/hr over 30 Minutes Intravenous  Once 01/05/21 1616 01/05/21 1742        Radiology Reports  CT CHEST WO CONTRAST  Result Date: 01/05/2021 CLINICAL DATA:  Chest pain or SOB, pleurisy or effusion suspected Pneumonia, effusion or abscess suspected, xray done Recent chest tube placement. EXAM: CT CHEST WITHOUT CONTRAST TECHNIQUE: Multidetector CT imaging of the chest was performed following the standard protocol without IV contrast. COMPARISON:  Radiograph earlier today. Most recent chest CT 12/29/2020 FINDINGS: Cardiovascular: Aortic atherosclerosis and coronary artery calcifications. TAVR. Borderline cardiomegaly. Mitral annulus calcifications. Similar trace pericardial effusion. Mediastinum/Nodes: Scattered mediastinal lymph nodes are not enlarged by size criteria. No obvious hilar adenopathy on this noncontrast exam. No thyroid nodule. No esophageal wall thickening. Lungs/Pleura: Right PleurX catheter in place, tip in the pleural space posteriorly the upper lung zone.  Moderate right pleural effusion persists. Left PleurX catheter courses inferiorly with tip at the lung base, not entirely included in the field of view. Small to moderate left pleural effusion which is partially loculated and tracking into the fissure. Size of pleural effusions of diminished from prior CT. Ground-glass opacity in septal thickening consistent with pulmonary edema. Compressive atelectasis adjacent to pleural effusions. No pneumothorax. Upper Abdomen: Small volume perihepatic ascites. Musculoskeletal: No acute osseous abnormalities. Diffuse degenerative change throughout the spine. Prominent body wall edema. IMPRESSION: 1. Right PleurX catheter in place, tip in the pleural space posteriorly the upper lung zone. Moderate right pleural effusion persists. Left PleurX catheter courses inferiorly with tip at the lung base, not entirely included in the field of view. Small to moderate left pleural effusion which courses into the inter lobar fissure. Pleural effusions have diminished in size from CT 1 week ago. 2. Pulmonary edema. Borderline cardiomegaly. 3. Small volume perihepatic ascites. Prominent body wall edema. Aortic Atherosclerosis (ICD10-I70.0). Electronically Signed   By: Keith Rake M.D.   On: 01/05/2021 17:53   US Renal Transplant w/Doppler  Result Date: 01/05/2021 CLINICAL DATA:  Acute kidney injury. EXAM: ULTRASOUND OF RENAL TRANSPLANT WITH RENAL DOPPLER ULTRASOUND TECHNIQUE: Ultrasound examination of the renal transplant was performed with gray-scale, color and duplex doppler evaluation. COMPARISON:  Abdominal CTA 07/16/2020 FINDINGS: Transplant kidney location: RLQ Transplant Kidney: Renal measurements: 13.4 x 7.5 x 5.6 cm = volume: 279m. Normal in size and parenchymal echogenicity. Mild to moderate hydronephrosis. No evidence of renal mass. No renal stone. No peri-transplant fluid collection seen. Color flow in the main renal artery:  Yes Color flow in the main renal vein:  Yes  Duplex Doppler Evaluation: Main Renal Artery Velocity: 73  cm/sec Main Renal Artery Resistive Index: 1.0 Venous waveform in main renal vein:  Present. Intrarenal resistive index in upper pole:  1.0 (normal 0.6-0.8; equivocal 0.8-0.9; abnormal >= 0.9) Intrarenal resistive index in lower pole: 1.0 (normal 0.6-0.8; equivocal 0.8-0.9; abnormal >= 0.9) Bladder: Normal for degree of bladder distention. The ureteral jet was not visualized. No bladder wall thickening or stone. Other findings:  Small amount of right lower quadrant ascites. IMPRESSION: 1. Mild to moderate hydronephrosis of the right lower quadrant renal transplant. Ureteral jet is not seen. Etiology is indeterminate, no urolithiasis is visualized. 2. Elevated intrarenal and main renal artery resistive indices. 3. Patent renal vein. 4. Small amount of right lower quadrant ascites. Electronically Signed   By: Keith Rake M.D.   On: 01/05/2021 20:32   DG Chest Port 1 View  Result Date: 01/05/2021 CLINICAL DATA:  Shortness of breath, PleurX catheters EXAM: PORTABLE CHEST 1 VIEW COMPARISON:  Portable exam 1655 hours compared to 01/01/2021 FINDINGS: RIGHT PleurX catheter present. LEFT PleurX catheter not definitely visualized. Normal heart size mediastinal contours post TAVR. Bibasilar atelectasis versus infiltrates, increased. No significant pleural effusion, pneumothorax or acute osseous findings. IMPRESSION: Bibasilar atelectasis versus infiltrates increased since previous exam, greater on LEFT. Electronically Signed   By: Lavonia Dana M.D.   On: 01/05/2021 17:03       DVT prophylaxis: Foot pumps  Code Status: Full code  Family Communication: No family at bedside   Consultants: Nephrology  Procedures:     Objective    Physical Examination:  General-appears in no acute distress Heart-S1-S2, regular, no murmur auscultated Lungs-decreased breath sounds bilaterally Abdomen-soft, nontender, no organomegaly Extremities-bilateral 3+  pitting edema in the lower extremities  Neuro-alert, oriented x3, no focal deficit noted 7  Status is: Inpatient  Dispo: The patient is from: Home              Anticipated d/c is to: Home              Anticipated d/c date is: 01/10/2021              Patient currently not stable for discharge  Barrier to discharge-ongoing acute CHF  COVID-19 Labs  Recent Labs    01/05/21 1930  FERRITIN 405*  LDH 190    Lab Results  Component Value Date   [place] NEGATIVE 01/05/2021   [place] NEGATIVE 12/09/2020   [place] NEGATIVE 10/26/2020   [place] NEGATIVE 09/05/2020    Microbiology  Recent Results (from the past 240 hour(s))  Surgical pcr screen     Status: Abnormal   Collection Time: 12/31/20  8:46 AM   Specimen: Nasal Mucosa; Nasal Swab  Result Value Ref Range Status   MRSA, PCR NEGATIVE NEGATIVE Final   Staphylococcus aureus POSITIVE (A) NEGATIVE Final    Comment: (NOTE) The Xpert SA Assay (FDA approved for NASAL specimens in patients 80 years of age and older), is one component of a comprehensive surveillance program. It is not intended to diagnose infection nor to guide or monitor treatment. Performed at [facility], [place] [address], [place], [place] [address]   Culture, blood (x 2)     Status: Abnormal (Preliminary result)   Collection Time: 01/05/21  4:36 PM   Specimen: BLOOD LEFT FOREARM  Result Value Ref Range Status   Specimen Description BLOOD LEFT FOREARM  Final   Special Requests   Final    BOTTLES DRAWN AEROBIC AND ANAEROBIC Blood Culture adequate volume   Culture  Setup Time  Final    GRAM POSITIVE COCCI IN BOTH AEROBIC AND ANAEROBIC BOTTLES CRITICAL RESULT CALLED TO, READ BACK BY AND VERIFIED WITH: PHARMD JEREMY FRENS AT [place] ON 01/06/21 BY KJ    Culture (A)  Final    ENTEROCOCCUS FAECALIS SUSCEPTIBILITIES TO FOLLOW Performed at [facility], [place] [address], [place], [place] [address]    Report Status PENDING   Incomplete  Blood Culture ID Panel (Reflexed)     Status: Abnormal   Collection Time: 01/05/21  4:36 PM  Result Value Ref Range Status   Enterococcus faecalis DETECTED (A) NOT DETECTED Final    Comment: CRITICAL RESULT CALLED TO, READ BACK BY AND VERIFIED WITH: PHARMD JEREMY FRENS AT [id number] ON 6/85/22 BY KJ    Enterococcus Faecium NOT DETECTED NOT DETECTED Corrected    Comment: CORRECTED ON 06/08 AT 1234: PREVIOUSLY REPORTED AS NOT DETECTED CRITICAL RESULT CALLED TO, READ BACK BY AND VERIFIED WITH: PHARMD JEREMY FRENS AT [id number] ON 6/85/22 BY KJ   Listeria monocytogenes NOT DETECTED NOT DETECTED Final   Staphylococcus species NOT DETECTED NOT DETECTED Final   Staphylococcus aureus (BCID) NOT DETECTED NOT DETECTED Final   Staphylococcus epidermidis NOT DETECTED NOT DETECTED Final   Staphylococcus lugdunensis NOT DETECTED NOT DETECTED Final   Streptococcus species NOT DETECTED NOT DETECTED Final   Streptococcus agalactiae NOT DETECTED NOT DETECTED Final   Streptococcus pneumoniae NOT DETECTED NOT DETECTED Final   Streptococcus pyogenes NOT DETECTED NOT DETECTED Final   A.calcoaceticus-baumannii NOT DETECTED NOT DETECTED Final   Bacteroides fragilis NOT DETECTED NOT DETECTED Final   Enterobacterales NOT DETECTED NOT DETECTED Final   Enterobacter cloacae complex NOT DETECTED NOT DETECTED Final   Escherichia coli NOT DETECTED NOT DETECTED Final   Klebsiella aerogenes NOT DETECTED NOT DETECTED Final   Klebsiella oxytoca NOT DETECTED NOT DETECTED Final   Klebsiella pneumoniae NOT DETECTED NOT DETECTED Final   Proteus species NOT DETECTED NOT DETECTED Final   Salmonella species NOT DETECTED NOT DETECTED Final   Serratia marcescens NOT DETECTED NOT DETECTED Final   Haemophilus influenzae NOT DETECTED NOT DETECTED Final   Neisseria meningitidis NOT DETECTED NOT DETECTED Final   Pseudomonas aeruginosa NOT DETECTED NOT DETECTED Final   Stenotrophomonas maltophilia NOT DETECTED NOT DETECTED Final    Candida albicans NOT DETECTED NOT DETECTED Final   Candida auris NOT DETECTED NOT DETECTED Final   Candida glabrata NOT DETECTED NOT DETECTED Final   Candida krusei NOT DETECTED NOT DETECTED Final   Candida parapsilosis NOT DETECTED NOT DETECTED Final   Candida tropicalis NOT DETECTED NOT DETECTED Final   Cryptococcus neoformans/gattii NOT DETECTED NOT DETECTED Final   Vancomycin resistance NOT DETECTED NOT DETECTED Final    Comment: Performed at [facility], [address] [place], [place] [address]  Culture, blood (x 2)     Status: None (Preliminary result)   Collection Time: 01/05/21  7:26 PM   Specimen: BLOOD  Result Value Ref Range Status   Specimen Description BLOOD SITE NOT SPECIFIED  Final   Special Requests   Final    BOTTLES DRAWN AEROBIC AND ANAEROBIC Blood Culture results may not be optimal due to an inadequate volume of blood received in culture bottles   Culture   Final    NO GROWTH 2 DAYS Performed at [facility], [place] [address], [place], [place] [address]    Report Status PENDING  Incomplete  Resp Panel by RT-PCR (Flu A&B, Covid) Nasopharyngeal Swab  Status: None   Collection Time: 01/05/21  8:57 PM   Specimen: Nasopharyngeal Swab; Nasopharyngeal(NP) swabs in vial transport medium  Result Value Ref Range Status   SARS Coronavirus 2 by RT PCR NEGATIVE NEGATIVE Final    Comment: (NOTE) SARS-CoV-2 target nucleic acids are NOT DETECTED.  The SARS-CoV-2 RNA is generally detectable in upper respiratory specimens during the acute phase of infection. The lowest concentration of SARS-CoV-2 viral copies this assay can detect is 138 copies/mL. A negative result does not preclude SARS-Cov-2 infection and should not be used as the sole basis for treatment or other patient management decisions. A negative result may occur with  improper specimen collection/handling, submission of specimen other than nasopharyngeal swab, presence of viral mutation(s) within  the areas targeted by this assay, and inadequate number of viral copies(<138 copies/mL). A negative result must be combined with clinical observations, patient history, and epidemiological information. The expected result is Negative.  Fact Sheet for Patients:  EntrepreneurPulse.com.au  Fact Sheet for Healthcare Providers:  IncredibleEmployment.be  This test is no t yet approved or cleared by the [place] FDA and  has been authorized for detection and/or diagnosis of SARS-CoV-2 by FDA under an Emergency Use Authorization (EUA). This EUA will remain  in effect (meaning this test can be used) for the duration of the COVID-19 declaration under Section 564(b)(1) of the Act, 21 U.S.C.section 360bbb-3(b)(1), unless the authorization is terminated  or revoked sooner.       Influenza A by PCR NEGATIVE NEGATIVE Final   Influenza B by PCR NEGATIVE NEGATIVE Final    Comment: (NOTE) The Xpert Xpress SARS-CoV-2/FLU/RSV plus assay is intended as an aid in the diagnosis of influenza from Nasopharyngeal swab specimens and should not be used as a sole basis for treatment. Nasal washings and aspirates are unacceptable for Xpert Xpress SARS-CoV-2/FLU/RSV testing.  Fact Sheet for Patients: EntrepreneurPulse.com.au  Fact Sheet for Healthcare Providers: IncredibleEmployment.be  This test is not yet approved or cleared by the [place] FDA and has been authorized for detection and/or diagnosis of SARS-CoV-2 by FDA under an Emergency Use Authorization (EUA). This EUA will remain in effect (meaning this test can be used) for the duration of the COVID-19 declaration under Section 564(b)(1) of the Act, 21 U.S.C. section 360bbb-3(b)(1), unless the authorization is terminated or revoked.  Performed at [facility], [place] [address], [place], [place] [address]   Respiratory (~20 pathogens) panel by PCR     Status:  None   Collection Time: 01/05/21 11:26 PM  Result Value Ref Range Status   Adenovirus NOT DETECTED NOT DETECTED Final   Coronavirus 229E NOT DETECTED NOT DETECTED Final    Comment: (NOTE) The Coronavirus on the Respiratory Panel, DOES NOT test for the novel  Coronavirus (2019 nCoV)    Coronavirus HKU1 NOT DETECTED NOT DETECTED Final   Coronavirus NL63 NOT DETECTED NOT DETECTED Final   Coronavirus OC43 NOT DETECTED NOT DETECTED Final   Metapneumovirus NOT DETECTED NOT DETECTED Final   Rhinovirus / Enterovirus NOT DETECTED NOT DETECTED Final   Influenza A NOT DETECTED NOT DETECTED Final   Influenza B NOT DETECTED NOT DETECTED Final   Parainfluenza Virus 1 NOT DETECTED NOT DETECTED Final   Parainfluenza Virus 2 NOT DETECTED NOT DETECTED Final   Parainfluenza Virus 3 NOT DETECTED NOT DETECTED Final   Parainfluenza Virus 4 NOT DETECTED NOT DETECTED Final   Respiratory Syncytial Virus NOT DETECTED NOT DETECTED Final   Bordetella pertussis NOT DETECTED NOT DETECTED Final  Bordetella Parapertussis NOT DETECTED NOT DETECTED Final   Chlamydophila pneumoniae NOT DETECTED NOT DETECTED Final   Mycoplasma pneumoniae NOT DETECTED NOT DETECTED Final    Comment: Performed at [facility], [place] [address], [place], [place] [address]    Pressure Injury 06/08/20 Coccyx Stage 1 -  Intact skin with non-blanchable redness of a localized area usually over a bony prominence. (Active)  06/08/20 1500  Location: Coccyx  Location Orientation:   Staging: Stage 1 -  Intact skin with non-blanchable redness of a localized area usually over a bony prominence.  Wound Description (Comments):   Present on Admission: Yes (present upon transfer)          [facility] If 7PM-7AM, please contact night-coverage at [email or web address], Office  [phone]   01/07/2021, 1:04 PM  LOS: 2 days

## 2021-01-07 NOTE — Progress Notes (Signed)
Initial Nutrition Assessment  DOCUMENTATION CODES:   Obesity unspecified  INTERVENTION:   -Ensure Enlive po TID, each supplement provides 350 kcal and 20 grams of protein  -MVI with minerals daily  NUTRITION DIAGNOSIS:   Increased nutrient needs related to acute illness (bilateral pleural effusions) as evidenced by estimated needs.  GOAL:   Patient will meet greater than or equal to 90% of their needs  MONITOR:   PO intake, Supplement acceptance, Labs, Weight trends, Skin, I & O's  REASON FOR ASSESSMENT:   Malnutrition Screening Tool    ASSESSMENT:   Steven [name] is a 65 y.o. 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  Pt admitted with CHF and recurrent bilateral pleural effusion.   Reviewed I/O's: +340 ml x 24 hours and +662 ml since admission  UOP: 600 ml x 24 hours  Chest tube: 1.1 L x 24 hours  Spoke with pt at bedside, who reports decreased appetite over the past 3 weeks. Per pt, he has not been feeling well and has not felt like eating. He estimates he has been eating 1 meal per day (egg sandwich). He reports eating "a little bit" of breakfast. Noted meal completions 25-100%.   Per pt, UBW is around 200#. He is unsure if he has lost weight, however, suspects that he has. Reviewed wt hx; wt has been stable over the past 3 weeks.   Discussed importance of good meal and supplement intake to promote healing. Pt was just given an Ensure supplement, which he reports he will drink and is willing to continue with them.   Pt with poor oral intake and would benefit from nutrient dense supplement. One Ensure Enlive supplement provides 350 kcals, 20 grams protein, and 44-45 grams of carbohydrate vs one Glucerna shake  supplement, which provides 220 kcals, 10 grams of protein, and 26 grams of carbohydrate. Given pt's hx of DM, RD will reassess adequacy of PO intake, CBGS, and adjust supplement regimen as appropriate at follow-up.    Medications reviewed and include colace.    Lab Results  Component Value Date   HGBA1C 5.2 12/31/2020   PTA DM medications are 5 mg glyburide BID and 0-30 units insulin aspart TID.   Labs reviewed: Phos: 5.4, CBGS: 123-175 (inpatient orders for glycemic control are 0-9 units insulin aspart TID with meals and 10 units insulin detemir daily).    NUTRITION - FOCUSED PHYSICAL EXAM:  Flowsheet Row Most Recent Value  Orbital Region No depletion  Upper Arm Region No depletion  Thoracic and Lumbar Region No depletion  Buccal Region No depletion  Temple Region No depletion  Clavicle Bone Region No depletion  Clavicle and Acromion Bone Region No depletion  Scapular Bone Region No depletion  Dorsal Hand No depletion  Patellar Region No depletion  Anterior Thigh Region No depletion  Posterior Calf Region No depletion  Edema (RD Assessment) Mild  Hair Reviewed  Eyes Reviewed  Mouth Reviewed  Skin Reviewed  Nails Reviewed       Diet Order:   Diet Order             Diet Heart Room service appropriate? Yes; Fluid consistency: Thin  Diet effective now  EDUCATION NEEDS:   Education needs have been addressed  Skin:  Skin Assessment: Skin Integrity Issues: Skin Integrity Issues:: Incisions Incisions: closed bilateral chest incisions  Last BM:  01/04/21  Height:   Ht Readings from Last 1 Encounters:  01/06/21 '5\' 11"'$  (1.803 m)    Weight:   Wt Readings from Last 1 Encounters:  01/07/21 117.3 kg    Ideal Body Weight:  73.1 kg  BMI:  Body mass index is 36.07 kg/m.  Estimated Nutritional Needs:   Kcal:  2100-2300  Protein:  110-125 grams  Fluid:  > 2 L    Loistine Chance, RD, LDN, [place] Registered Dietitian II Certified Diabetes  Care and Education Specialist Please refer to [facility] for RD and/or RD on-call/weekend/after hours pager

## 2021-01-07 NOTE — Progress Notes (Signed)
RT note. Patient placed on 10cpap (per patient, home settings) with 2L bled in line. Patient sat 98%, W. Stable VS. RT will continue to monitor.

## 2021-01-07 NOTE — Progress Notes (Signed)
Pt. Hgb critically low at 6.4. On call for [facility] paged to make aware.

## 2021-01-07 NOTE — Progress Notes (Addendum)
[facility] for Infectious Disease  Date of Admission:  01/05/2021   Total days of antibiotics 3         ASSESSMENT: Patient requires blood transfusion overnight for hemoglobin of 6.4, thought is due to anemia of end-stage renal disease.  Patient appears clinically well on exam, afebrile overnight.  Continue ampicillin and ceftriaxone for Enterococcus bacteremia.  Pending culture susceptibility.  Pending TTE today to rule out endocarditis, which will determine the duration of the therapy.  PLAN: Continue IV ampicillin and ceftriaxone TTE today Follow-up repeat blood culture  Active Problems:   AKI (acute kidney injury) ([facility])   Acute on chronic diastolic CHF (congestive heart failure) (HCC)   CHF (congestive heart failure) (HCC)   Scheduled Meds:  aspirin  81 mg Oral Daily   atorvastatin  10 mg Oral Daily   carvedilol  3.125 mg Oral BID WC   clopidogrel  75 mg Oral Daily   docusate sodium  100 mg Oral Daily   feeding supplement  237 mL Oral BID BM   fenofibrate  160 mg Oral Daily   insulin aspart  0-9 Units Subcutaneous TID WC   insulin detemir  10 Units Subcutaneous Daily   mycophenolate  360 mg Oral BID   pantoprazole  40 mg Oral Daily   sodium bicarbonate  650 mg Oral BID   sodium chloride flush  3 mL Intravenous Q12H   tacrolimus  1 mg Oral BID   Continuous Infusions:  sodium chloride Stopped (01/06/21 1621)   albumin human 25 g (01/07/21 0441)   ampicillin (OMNIPEN) IV 2 g (01/07/21 0446)   cefTRIAXone (ROCEPHIN)  IV 2 g (01/07/21 0919)   ferric gluconate (FERRLECIT) IVPB 250 mg (01/07/21 0936)   furosemide 120 mg (01/07/21 0934)   PRN Meds:.sodium chloride, acetaminophen, acetaminophen, cyclobenzaprine, labetalol, ondansetron (ZOFRAN) IV, sodium chloride flush, traMADol, traZODone   SUBJECTIVE: Patient is seen at bedside.  He appears comfortable in no acute distress.  His sister is also at bedside.  Patient said that he is feeling well, better than  yesterday.  Reports feeling fatigued.  States that his breathing has improved.  Denies any new complaints.  Review of Systems: ROS Per HPI  Allergies  Allergen Reactions   Sulfa Antibiotics Rash    OBJECTIVE: Vitals:   01/07/21 0000 01/07/21 0400 01/07/21 0756 01/07/21 1030  BP: 125/64  (!) 143/64 (!) 142/67  Pulse: 83  90 88  Resp: (!) 23  (!) 22 18  Temp: 98.1 F (36.7 C) 97.8 F (36.6 C) 98.1 F (36.7 C) 98.2 F (36.8 C)  TempSrc: Oral Oral Oral Oral  SpO2: 99%  100%   Weight: 117.3 kg     Height:       Body mass index is 36.07 kg/m.  Physical Exam Constitutional:      General: He is not in acute distress.    Appearance: He is not toxic-appearing.  HENT:     Head: Normocephalic.  Eyes:     General: No scleral icterus.       Right eye: No discharge.        Left eye: No discharge.     Conjunctiva/sclera: Conjunctivae normal.  Cardiovascular:     Rate and Rhythm: Normal rate and regular rhythm.     Heart sounds: Murmur (2/6 systolic murmur at left sternal border) heard.  Pulmonary:     Effort: Pulmonary effort is normal. No respiratory distress.  Skin:  General: Skin is warm.     Coloration: Skin is not jaundiced.  Neurological:     Mental Status: He is alert. Mental status is at baseline.  Psychiatric:        Mood and Affect: Mood normal.        Thought Content: Thought content normal.        Judgment: Judgment normal.    Lab Results Lab Results  Component Value Date   WBC 1.6 (L) 01/07/2021   HGB 6.4 (LL) 01/07/2021   HCT 20.3 (L) 01/07/2021   MCV 82.9 01/07/2021   PLT 98 (L) 01/07/2021    Lab Results  Component Value Date   CREATININE 5.32 (H) 01/07/2021   BUN 80 (H) 01/07/2021   NA 137 01/07/2021   K 4.6 01/07/2021   CL 102 01/07/2021   CO2 21 (L) 01/07/2021    Lab Results  Component Value Date   ALT 15 01/07/2021   AST 21 01/07/2021   ALKPHOS 92 01/07/2021   BILITOT 0.6 01/07/2021     Microbiology: Recent Results (from the  past 240 hour(s))  Surgical pcr screen     Status: Abnormal   Collection Time: 12/31/20  8:46 AM   Specimen: Nasal Mucosa; Nasal Swab  Result Value Ref Range Status   MRSA, PCR NEGATIVE NEGATIVE Final   Staphylococcus aureus POSITIVE (A) NEGATIVE Final    Comment: (NOTE) The Xpert SA Assay (FDA approved for NASAL specimens in patients 60 years of age and older), is one component of a comprehensive surveillance program. It is not intended to diagnose infection nor to guide or monitor treatment. Performed at [facility], [place] [address], [place], [place] [address]   Culture, blood (x 2)     Status: Abnormal (Preliminary result)   Collection Time: 01/05/21  4:36 PM   Specimen: BLOOD LEFT FOREARM  Result Value Ref Range Status   Specimen Description BLOOD LEFT FOREARM  Final   Special Requests   Final    BOTTLES DRAWN AEROBIC AND ANAEROBIC Blood Culture adequate volume   Culture  Setup Time   Final    GRAM POSITIVE COCCI IN BOTH AEROBIC AND ANAEROBIC BOTTLES CRITICAL RESULT CALLED TO, READ BACK BY AND VERIFIED WITH: PHARMD JEREMY FRENS AT [id number] [place] ON 01/06/21 BY KJ    Culture (A)  Final    ENTEROCOCCUS FAECALIS SUSCEPTIBILITIES TO FOLLOW Performed at [facility], [place] [address] [place], [place] [address]    Report Status PENDING  Incomplete  Blood Culture ID Panel (Reflexed)     Status: Abnormal   Collection Time: 01/05/21  4:36 PM  Result Value Ref Range Status   Enterococcus faecalis DETECTED (A) NOT DETECTED Final    Comment: CRITICAL RESULT CALLED TO, READ BACK BY AND VERIFIED WITH: PHARMD JEREMY FRENS AT [id number] ON 6/85/22 BY KJ    Enterococcus Faecium NOT DETECTED NOT DETECTED Corrected    Comment: CORRECTED ON 06/08 AT 1234: PREVIOUSLY REPORTED AS NOT DETECTED CRITICAL RESULT CALLED TO, READ BACK BY AND VERIFIED WITH: PHARMD JEREMY FRENS AT [id number] ON 6/85/22 BY KJ   Listeria monocytogenes NOT DETECTED NOT DETECTED Final   Staphylococcus species NOT DETECTED NOT  DETECTED Final   Staphylococcus aureus (BCID) NOT DETECTED NOT DETECTED Final   Staphylococcus epidermidis NOT DETECTED NOT DETECTED Final   Staphylococcus lugdunensis NOT DETECTED NOT DETECTED Final   Streptococcus species NOT DETECTED NOT DETECTED Final   Streptococcus agalactiae NOT DETECTED NOT DETECTED Final   Streptococcus  pneumoniae NOT DETECTED NOT DETECTED Final   Streptococcus pyogenes NOT DETECTED NOT DETECTED Final   A.calcoaceticus-baumannii NOT DETECTED NOT DETECTED Final   Bacteroides fragilis NOT DETECTED NOT DETECTED Final   Enterobacterales NOT DETECTED NOT DETECTED Final   Enterobacter cloacae complex NOT DETECTED NOT DETECTED Final   Escherichia coli NOT DETECTED NOT DETECTED Final   Klebsiella aerogenes NOT DETECTED NOT DETECTED Final   Klebsiella oxytoca NOT DETECTED NOT DETECTED Final   Klebsiella pneumoniae NOT DETECTED NOT DETECTED Final   Proteus species NOT DETECTED NOT DETECTED Final   Salmonella species NOT DETECTED NOT DETECTED Final   Serratia marcescens NOT DETECTED NOT DETECTED Final   Haemophilus influenzae NOT DETECTED NOT DETECTED Final   Neisseria meningitidis NOT DETECTED NOT DETECTED Final   Pseudomonas aeruginosa NOT DETECTED NOT DETECTED Final   Stenotrophomonas maltophilia NOT DETECTED NOT DETECTED Final   Candida albicans NOT DETECTED NOT DETECTED Final   Candida auris NOT DETECTED NOT DETECTED Final   Candida glabrata NOT DETECTED NOT DETECTED Final   Candida krusei NOT DETECTED NOT DETECTED Final   Candida parapsilosis NOT DETECTED NOT DETECTED Final   Candida tropicalis NOT DETECTED NOT DETECTED Final   Cryptococcus neoformans/gattii NOT DETECTED NOT DETECTED Final   Vancomycin resistance NOT DETECTED NOT DETECTED Final    Comment: Performed at [facility], [address] [place], [place] [address]  Culture, blood (x 2)     Status: None (Preliminary result)   Collection Time: 01/05/21  7:26 PM   Specimen: BLOOD  Result Value Ref  Range Status   Specimen Description BLOOD SITE NOT SPECIFIED  Final   Special Requests   Final    BOTTLES DRAWN AEROBIC AND ANAEROBIC Blood Culture results may not be optimal due to an inadequate volume of blood received in culture bottles   Culture   Final    NO GROWTH 2 DAYS Performed at [facility], [place] [address] [place], [place] [address]    Report Status PENDING  Incomplete  Resp Panel by RT-PCR (Flu A&B, Covid) Nasopharyngeal Swab     Status: None   Collection Time: 01/05/21  8:57 PM   Specimen: Nasopharyngeal Swab; Nasopharyngeal(NP) swabs in vial transport medium  Result Value Ref Range Status   SARS Coronavirus 2 by RT PCR NEGATIVE NEGATIVE Final    Comment: (NOTE) SARS-CoV-2 target nucleic acids are NOT DETECTED.  The SARS-CoV-2 RNA is generally detectable in upper respiratory specimens during the acute phase of infection. The lowest concentration of SARS-CoV-2 viral copies this assay can detect is 138 copies/mL. A negative result does not preclude SARS-Cov-2 infection and should not be used as the sole basis for treatment or other patient management decisions. A negative result may occur with  improper specimen collection/handling, submission of specimen other than nasopharyngeal swab, presence of viral mutation(s) within the areas targeted by this assay, and inadequate number of viral copies(<138 copies/mL). A negative result must be combined with clinical observations, patient history, and epidemiological information. The expected result is Negative.  Fact Sheet for Patients:  EntrepreneurPulse.com.au  Fact Sheet for Healthcare Providers:  IncredibleEmployment.be  This test is no t yet approved or cleared by the [place] FDA and  has been authorized for detection and/or diagnosis of SARS-CoV-2 by FDA under an Emergency Use Authorization (EUA). This EUA will remain  in effect (meaning this test can be used) for the  duration of the COVID-19 declaration under Section 564(b)(1) of the Act, 21 U.S.C.section 360bbb-3(b)(1), unless  the authorization is terminated  or revoked sooner.       Influenza A by PCR NEGATIVE NEGATIVE Final   Influenza B by PCR NEGATIVE NEGATIVE Final    Comment: (NOTE) The Xpert Xpress SARS-CoV-2/FLU/RSV plus assay is intended as an aid in the diagnosis of influenza from Nasopharyngeal swab specimens and should not be used as a sole basis for treatment. Nasal washings and aspirates are unacceptable for Xpert Xpress SARS-CoV-2/FLU/RSV testing.  Fact Sheet for Patients: EntrepreneurPulse.com.au  Fact Sheet for Healthcare Providers: IncredibleEmployment.be  This test is not yet approved or cleared by the [place] FDA and has been authorized for detection and/or diagnosis of SARS-CoV-2 by FDA under an Emergency Use Authorization (EUA). This EUA will remain in effect (meaning this test can be used) for the duration of the COVID-19 declaration under Section 564(b)(1) of the Act, 21 U.S.C. section 360bbb-3(b)(1), unless the authorization is terminated or revoked.  Performed at [facility], [place] [address], [place], [place] [address]   Respiratory (~20 pathogens) panel by PCR     Status: None   Collection Time: 01/05/21 11:26 PM  Result Value Ref Range Status   Adenovirus NOT DETECTED NOT DETECTED Final   Coronavirus 229E NOT DETECTED NOT DETECTED Final    Comment: (NOTE) The Coronavirus on the Respiratory Panel, DOES NOT test for the novel  Coronavirus (2019 nCoV)    Coronavirus HKU1 NOT DETECTED NOT DETECTED Final   Coronavirus NL63 NOT DETECTED NOT DETECTED Final   Coronavirus OC43 NOT DETECTED NOT DETECTED Final   Metapneumovirus NOT DETECTED NOT DETECTED Final   Rhinovirus / Enterovirus NOT DETECTED NOT DETECTED Final   Influenza A NOT DETECTED NOT DETECTED Final   Influenza B NOT DETECTED NOT DETECTED Final    Parainfluenza Virus 1 NOT DETECTED NOT DETECTED Final   Parainfluenza Virus 2 NOT DETECTED NOT DETECTED Final   Parainfluenza Virus 3 NOT DETECTED NOT DETECTED Final   Parainfluenza Virus 4 NOT DETECTED NOT DETECTED Final   Respiratory Syncytial Virus NOT DETECTED NOT DETECTED Final   Bordetella pertussis NOT DETECTED NOT DETECTED Final   Bordetella Parapertussis NOT DETECTED NOT DETECTED Final   Chlamydophila pneumoniae NOT DETECTED NOT DETECTED Final   Mycoplasma pneumoniae NOT DETECTED NOT DETECTED Final    Comment: Performed at [facility], [place]. [address], [place], [place] [address]    Gaylan Gerold, [facility] [phone] pager   [phone] cell 01/07/2021, 10:31 AM

## 2021-01-07 NOTE — Progress Notes (Signed)
Patient ID: [name], male   DOB: Mar 27, 1956, 65 y.o.   MRN: [id number] S: Received blood transfusion due to drop in Hgb to 6.4.  ID following and ordered TTE to r/o endocarditis due to enterococcus bacteremia.  Reports he is breathing better today. O:BP (!) 143/60   Pulse 86   Temp 98.4 F (36.9 C) (Oral)   Resp 20   Ht '5\' 11"'$  (1.803 m)   Wt 117.3 kg   SpO2 97%   BMI 36.07 kg/m   Intake/Output Summary (Last 24 hours) at 01/07/2021 1157 Last data filed at 01/07/2021 0300 Gross per 24 hour  Intake 1679.76 ml  Output 1700 ml  Net -20.24 ml   Intake/Output: I/O last 3 completed shifts: In: 2362 [P.O.:1410; I.V.:4.6; IV Piggyback:947.3] Out: 1700 [Urine:600; Chest Tube:1100]  Intake/Output this shift:  No intake/output data recorded. Weight change: 6.169 kg Gen:NAD CVS:RRR Resp: decreased BS at bases [id number], +BS, soft, NT Ext: 2+ anasarca to mid abdomen, s/p LBKA  Recent Labs  Lab 01/01/21 0531 01/05/21 1550 01/06/21 0240 01/07/21 0527  NA 136 130* 132* 137  K 3.3* 4.2 4.2 4.6  CL 107 102 102 102  CO2 20* 19* 16* 21*  GLUCOSE 34* 193* 175* 143*  BUN 54* 71* 77* 80*  CREATININE 4.43* 4.71* 4.72* 5.32*  ALBUMIN  --  1.8*  --  2.5*  CALCIUM 7.5* 7.3* 7.7* 8.2*  PHOS  --   --   --  5.4*  AST  --  18  --  21  ALT  --  12  --  15   Liver Function Tests: Recent Labs  Lab 01/05/21 1550 01/07/21 0527  AST 18 21  ALT 12 15  ALKPHOS 129* 92  BILITOT 0.5 0.6  PROT 4.6* 4.9*  ALBUMIN 1.8* 2.5*   No results for input(s): LIPASE, AMYLASE in the last 168 hours. No results for input(s): AMMONIA in the last 168 hours. CBC: Recent Labs  Lab 01/01/21 0531 01/05/21 1550 01/07/21 0527  WBC 3.3* 4.9 1.6*  NEUTROABS  --  4.0  --   HGB 8.9* 8.7* 6.4*  HCT 28.0* 26.7* 20.3*  MCV 83.3 80.9 82.9  PLT 59* 105* 98*   Cardiac Enzymes: No results for input(s): CKTOTAL, CKMB, CKMBINDEX, TROPONINI in the last 168 hours. CBG: Recent Labs  Lab 01/06/21 1122  01/06/21 1647 01/06/21 2112 01/07/21 0558 01/07/21 1150  GLUCAP 214* 166* 175* 123* 154*    Iron Studies:  Recent Labs    01/05/21 1930  IRON 14*  TIBC 155*  FERRITIN 405*   Studies/Results: CT CHEST WO CONTRAST  Result Date: 01/05/2021 CLINICAL DATA:  Chest pain or SOB, pleurisy or effusion suspected Pneumonia, effusion or abscess suspected, xray done Recent chest tube placement. EXAM: CT CHEST WITHOUT CONTRAST TECHNIQUE: Multidetector CT imaging of the chest was performed following the standard protocol without IV contrast. COMPARISON:  Radiograph earlier today. Most recent chest CT 12/29/2020 FINDINGS: Cardiovascular: Aortic atherosclerosis and coronary artery calcifications. TAVR. Borderline cardiomegaly. Mitral annulus calcifications. Similar trace pericardial effusion. Mediastinum/Nodes: Scattered mediastinal lymph nodes are not enlarged by size criteria. No obvious hilar adenopathy on this noncontrast exam. No thyroid nodule. No esophageal wall thickening. Lungs/Pleura: Right PleurX catheter in place, tip in the pleural space posteriorly the upper lung zone. Moderate right pleural effusion persists. Left PleurX catheter courses inferiorly with tip at the lung base, not entirely included in the field of view. Small to moderate left pleural effusion which is partially loculated and tracking  into the fissure. Size of pleural effusions of diminished from prior CT. Ground-glass opacity in septal thickening consistent with pulmonary edema. Compressive atelectasis adjacent to pleural effusions. No pneumothorax. Upper Abdomen: Small volume perihepatic ascites. Musculoskeletal: No acute osseous abnormalities. Diffuse degenerative change throughout the spine. Prominent body wall edema. IMPRESSION: 1. Right PleurX catheter in place, tip in the pleural space posteriorly the upper lung zone. Moderate right pleural effusion persists. Left PleurX catheter courses inferiorly with tip at the lung base, not  entirely included in the field of view. Small to moderate left pleural effusion which courses into the inter lobar fissure. Pleural effusions have diminished in size from CT 1 week ago. 2. Pulmonary edema. Borderline cardiomegaly. 3. Small volume perihepatic ascites. Prominent body wall edema. Aortic Atherosclerosis (ICD10-I70.0). Electronically Signed   By: Keith Rake M.D.   On: 01/05/2021 17:53   US Renal Transplant w/Doppler  Result Date: 01/05/2021 CLINICAL DATA:  Acute kidney injury. EXAM: ULTRASOUND OF RENAL TRANSPLANT WITH RENAL DOPPLER ULTRASOUND TECHNIQUE: Ultrasound examination of the renal transplant was performed with gray-scale, color and duplex doppler evaluation. COMPARISON:  Abdominal CTA 07/16/2020 FINDINGS: Transplant kidney location: RLQ Transplant Kidney: Renal measurements: 13.4 x 7.5 x 5.6 cm = volume: 287m. Normal in size and parenchymal echogenicity. Mild to moderate hydronephrosis. No evidence of renal mass. No renal stone. No peri-transplant fluid collection seen. Color flow in the main renal artery:  Yes Color flow in the main renal vein:  Yes Duplex Doppler Evaluation: Main Renal Artery Velocity: 73 cm/sec Main Renal Artery Resistive Index: 1.0 Venous waveform in main renal vein:  Present. Intrarenal resistive index in upper pole:  1.0 (normal 0.6-0.8; equivocal 0.8-0.9; abnormal >= 0.9) Intrarenal resistive index in lower pole: 1.0 (normal 0.6-0.8; equivocal 0.8-0.9; abnormal >= 0.9) Bladder: Normal for degree of bladder distention. The ureteral jet was not visualized. No bladder wall thickening or stone. Other findings:  Small amount of right lower quadrant ascites. IMPRESSION: 1. Mild to moderate hydronephrosis of the right lower quadrant renal transplant. Ureteral jet is not seen. Etiology is indeterminate, no urolithiasis is visualized. 2. Elevated intrarenal and main renal artery resistive indices. 3. Patent renal vein. 4. Small amount of right lower quadrant ascites.  Electronically Signed   By: MKeith RakeM.D.   On: 01/05/2021 20:32   DG Chest Port 1 View  Result Date: 01/05/2021 CLINICAL DATA:  Shortness of breath, PleurX catheters EXAM: PORTABLE CHEST 1 VIEW COMPARISON:  Portable exam 1655 hours compared to 01/01/2021 FINDINGS: RIGHT PleurX catheter present. LEFT PleurX catheter not definitely visualized. Normal heart size mediastinal contours post TAVR. Bibasilar atelectasis versus infiltrates, increased. No significant pleural effusion, pneumothorax or acute osseous findings. IMPRESSION: Bibasilar atelectasis versus infiltrates increased since previous exam, greater on LEFT. Electronically Signed   By: MLavonia DanaM.D.   On: 01/05/2021 17:03    aspirin  81 mg Oral Daily   atorvastatin  10 mg Oral Daily   carvedilol  3.125 mg Oral BID WC   clopidogrel  75 mg Oral Daily   docusate sodium  100 mg Oral Daily   feeding supplement  237 mL Oral BID BM   fenofibrate  160 mg Oral Daily   insulin aspart  0-9 Units Subcutaneous TID WC   insulin detemir  10 Units Subcutaneous Daily   mycophenolate  360 mg Oral BID   pantoprazole  40 mg Oral Daily   sodium bicarbonate  650 mg Oral BID   sodium chloride flush  3 mL Intravenous Q12H  tacrolimus  1 mg Oral BID    BMET    Component Value Date/Time   NA 137 01/07/2021 0527   NA 139 11/10/2020 1137   K 4.6 01/07/2021 0527   CL 102 01/07/2021 0527   CO2 21 (L) 01/07/2021 0527   GLUCOSE 143 (H) 01/07/2021 0527   BUN 80 (H) 01/07/2021 0527   BUN 31 (H) 11/10/2020 1137   CREATININE 5.32 (H) 01/07/2021 0527   CREATININE 1.99 (H) 07/09/2020 1155   CALCIUM 8.2 (L) 01/07/2021 0527   CALCIUM 7.6 (L) 07/12/2007 0400   GFRNONAA 11 (L) 01/07/2021 0527   GFRNONAA 61 10/03/2013 1432   GFRAA 53 (L) 09/16/2020 1404   GFRAA 71 10/03/2013 1432   CBC    Component Value Date/Time   WBC 1.6 (L) 01/07/2021 0527   RBC 2.45 (L) 01/07/2021 0527   HGB 6.4 (LL) 01/07/2021 0527   HCT 20.3 (L) 01/07/2021 0527   PLT 98  (L) 01/07/2021 0527   MCV 82.9 01/07/2021 0527   MCV 85.9 10/03/2013 1354   MCH 26.1 01/07/2021 0527   MCHC 31.5 01/07/2021 0527   RDW 17.3 (H) 01/07/2021 0527   LYMPHSABS 0.4 (L) 01/05/2021 1550   MONOABS 0.4 01/05/2021 1550   EOSABS 0.1 01/05/2021 1550   BASOSABS 0.0 01/05/2021 1550    HPI: [id number] MA is an 65 y.o. 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  Followed by Dr. 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  [place] with some hydro to transplanted kidney.  Possibly cardiorenal syndrome (supported by low UNa), but also concerning for nephrotic syndrome.  Will obtain 24 hour urine protein.  May need transfer to [facility] for transplant kidney biopsy. Not responding to IV lasix 40 mg so increased to 80 mg with some improvement. Will increase to 120 mg tid BUN and Scr continues to climb.  Discussed the possible need for HD during this admission if he does not respond to higher dose of lasix. Would consult IR for tunneled HD cath, however given + blood cultures will consult for temporary HD catheter tomorrow if no improvement. Acute hypoxic respiratory failure - secondary to recurrent pleural effusions. Acute on chronic CHF - ECHO in 3/22 showed preserved EF with grade II DD and mildly increased gradient across TAVR.   Increase furosemide to 120 mg IV tid Recommend repeat ECHO to evaluate EF now that he has pulmonary edema as well as anasarca.   Will also order 24 hour urine protein to r/o nephrotic syndrome as cause of anasarca.   Immunosuppression -  continue with home dose of prograf and myfortic Enterococcal Faecalis bacteremia - ID following and managing abx.  TTE pending to r/o SBE.   Anemia of CKD and iron deficiency - low TSAT will order IV feraheme and follow.  Hgb dropped to 6.4 and s/p blood transfusion today. Hyponatremia - mild in setting of volume overload.  Follow with diuresis. Recurrent bilateral pleural effusions - s/p bilateral PleurX catheters on 01/01/21.  Drain per primary svc. HTN - stable DM - per primary Thrombocytopenia - chronic  issue  Donetta Potts, MD [employer] [phone]

## 2021-01-07 NOTE — Progress Notes (Signed)
Overnight event  Hemoglobin down to 6.4 on morning labs, was 8.7 on 01/05/2021.  Anemia likely related to end-stage renal disease.  Patient denies hematemesis, hematochezia, melena, or hematuria.  He is endorsing slight shortness of breath but denies chest pain or dizziness.  Receiving IV Lasix for volume overload.  No change in vital signs.  -Type and screen.  Benefits versus risks discussed and 1 unit PRBCs ordered after obtaining verbal consent from the patient.  Follow-up posttransfusion H&H.

## 2021-01-07 NOTE — Progress Notes (Signed)
PT Cancellation Note  Patient Details Name: Steven [name] MRN: [id number] DOB: 1956/06/06   Cancelled Treatment:    Reason Eval/Treat Not Completed: Fatigue/lethargy limiting ability to participate. Pt reports fatigue after echo and multiple events of the day, requests deferral of PT until tomorrow. PT will follow up as time allows.   Zenaida Niece 01/07/2021, 3:41 PM

## 2021-01-07 NOTE — Telephone Encounter (Signed)
Steven Haring, RN with [employer] [phone] contacted the office to state patient was transported via ambulance to the hospital. She went to see him in the home and he had decreased urine output (unable to weigh daily), shortness of breath, R-40, BP- 138/52, in irregular rhythm, and a dry cough. She also stated that he had increase weakness. Dr. Cyndia Bent to be notified.

## 2021-01-08 ENCOUNTER — Inpatient Hospital Stay (HOSPITAL_COMMUNITY): Payer: BC Managed Care – PPO

## 2021-01-08 HISTORY — PX: IR FLUORO GUIDE CV LINE RIGHT: IMG2283

## 2021-01-08 HISTORY — PX: IR US GUIDE VASC ACCESS RIGHT: IMG2390

## 2021-01-08 LAB — GLUCOSE, CAPILLARY
Glucose-Capillary: 127 mg/dL — ABNORMAL HIGH (ref 70–99)
Glucose-Capillary: 143 mg/dL — ABNORMAL HIGH (ref 70–99)
Glucose-Capillary: 113 mg/dL — ABNORMAL HIGH (ref 70–99)
Glucose-Capillary: 91 mg/dL (ref 70–99)

## 2021-01-08 LAB — CBC
HCT: 22.3 % — ABNORMAL LOW (ref 39.0–52.0)
Hemoglobin: 7.1 g/dL — ABNORMAL LOW (ref 13.0–17.0)
MCH: 26.9 pg (ref 26.0–34.0)
MCHC: 31.8 g/dL (ref 30.0–36.0)
MCV: 84.5 fL (ref 80.0–100.0)
Platelets: 90 10*3/uL — ABNORMAL LOW (ref 150–400)
RBC: 2.64 MIL/uL — ABNORMAL LOW (ref 4.22–5.81)
RDW: 17.2 % — ABNORMAL HIGH (ref 11.5–15.5)
WBC: 1.3 10*3/uL — CL (ref 4.0–10.5)
nRBC: 0 % (ref 0.0–0.2)

## 2021-01-08 LAB — RENAL FUNCTION PANEL
Albumin: 2.8 g/dL — ABNORMAL LOW (ref 3.5–5.0)
Anion gap: 16 — ABNORMAL HIGH (ref 5–15)
BUN: 82 mg/dL — ABNORMAL HIGH (ref 8–23)
CO2: 17 mmol/L — ABNORMAL LOW (ref 22–32)
Calcium: 8.2 mg/dL — ABNORMAL LOW (ref 8.9–10.3)
Chloride: 105 mmol/L (ref 98–111)
Creatinine, Ser: 5.34 mg/dL — ABNORMAL HIGH (ref 0.61–1.24)
GFR, Estimated: 11 mL/min — ABNORMAL LOW (ref >60)
Glucose, Bld: 107 mg/dL — ABNORMAL HIGH (ref 70–99)
Phosphorus: 5.8 mg/dL — ABNORMAL HIGH (ref 2.5–4.6)
Potassium: 4.7 mmol/L (ref 3.5–5.1)
Sodium: 138 mmol/L (ref 135–145)

## 2021-01-08 LAB — CULTURE, BLOOD (ROUTINE X 2): Special Requests: ADEQUATE

## 2021-01-08 MED ORDER — LIDOCAINE HCL 1 % IJ SOLN
INTRAMUSCULAR | Status: DC | PRN
  Administered 2021-01-08: 10 mL

## 2021-01-08 MED ORDER — CHLORHEXIDINE GLUCONATE CLOTH 2 % EX PADS
6.0000 | MEDICATED_PAD | Freq: Every day | CUTANEOUS | Status: DC
  Administered 2021-01-09 – 2021-01-10 (×2): 6 via TOPICAL

## 2021-01-08 MED ORDER — HEPARIN SODIUM (PORCINE) 1000 UNIT/ML IJ SOLN
INTRAMUSCULAR | Status: AC
  Filled 2021-01-08: qty 1

## 2021-01-08 MED ORDER — LIDOCAINE HCL (PF) 1 % IJ SOLN
INTRAMUSCULAR | Status: AC
  Filled 2021-01-08: qty 30

## 2021-01-08 MED ORDER — MYCOPHENOLATE SODIUM 180 MG PO TBEC
180.0000 mg | DELAYED_RELEASE_TABLET | Freq: Two times a day (BID) | ORAL | Status: DC
  Administered 2021-01-08 (×2): 180 mg via ORAL
  Filled 2021-01-08: qty 1

## 2021-01-08 NOTE — Progress Notes (Signed)
RN and NT at pts. Bedside to provide incontinence care and change bed linens. Pt. Refusing to be cleaned and have linens changed at this time. Charge RN made aware. Will re-attempt to provide care

## 2021-01-08 NOTE — Progress Notes (Signed)
Critical WBC of 1.3. On call for [facility] paged to make aware.

## 2021-01-08 NOTE — Progress Notes (Signed)
Patient ID: Steven [name], male   DOB: [birth year], 65 y.o.   MRN: [id number] S: Feels a little better but still with SOB. O:BP 119/62 (BP Location: Right Arm)   Pulse 86   Temp 98 F (36.7 C) (Oral)   Resp 15   Ht '5\' 11"'$  (1.803 m)   Wt 118.2 kg   SpO2 97%   BMI 36.34 kg/m   Intake/Output Summary (Last 24 hours) at 01/08/2021 1234 Last data filed at 01/08/2021 [id number] Gross per 24 hour  Intake 1721.67 ml  Output 500 ml  Net 1221.67 ml   Intake/Output: I/O last 3 completed shifts: In: 2458.1 [P.O.:720; Blood:357.1; IV Piggyback:1381] Out: 700 [Urine:700]  Intake/Output this shift:  Total I/O In: 223 [P.O.:220; I.V.:3] Out: -  Weight change: 0.9 kg Gen: NAD CVS: RRR Resp: decreased BS at bases Abd:+BS, soft, obese, + edema of pannus Ext:2+ anasarca, s/p LBKA  Recent Labs  Lab 01/05/21 1550 01/06/21 0240 01/07/21 0527 01/08/21 0311  NA 130* 132* 137 138  K 4.2 4.2 4.6 4.7  CL 102 102 102 105  CO2 19* 16* 21* 17*  GLUCOSE 193* 175* 143* 107*  BUN 71* 77* 80* 82*  CREATININE 4.71* 4.72* 5.32* 5.34*  ALBUMIN 1.8*  --  2.5* 2.8*  CALCIUM 7.3* 7.7* 8.2* 8.2*  PHOS  --   --  5.4* 5.8*  AST 18  --  21  --   ALT 12  --  15  --    Liver Function Tests: Recent Labs  Lab 01/05/21 1550 01/07/21 0527 01/08/21 0311  AST 18 21  --   ALT 12 15  --   ALKPHOS 129* 92  --   BILITOT 0.5 0.6  --   PROT 4.6* 4.9*  --   ALBUMIN 1.8* 2.5* 2.8*   No results for input(s): LIPASE, AMYLASE in the last 168 hours. No results for input(s): AMMONIA in the last 168 hours. CBC: Recent Labs  Lab 01/05/21 1550 01/07/21 0527 01/07/21 1601 01/08/21 0311  WBC 4.9 1.6*  --  1.3*  NEUTROABS 4.0  --   --   --   HGB 8.7* 6.4* 7.3* 7.1*  HCT 26.7* 20.3* 22.9* 22.3*  MCV 80.9 82.9  --  84.5  PLT 105* 98*  --  90*   Cardiac Enzymes: No results for input(s): CKTOTAL, CKMB, CKMBINDEX, TROPONINI in the last 168 hours. CBG: Recent Labs  Lab 01/07/21 1150 01/07/21 1642 01/07/21 2134  01/08/21 0601 01/08/21 1122  GLUCAP 154* 160* 137* 127* 143*    Iron Studies:  Recent Labs    01/05/21 1930  IRON 14*  TIBC 155*  FERRITIN 405*   Studies/Results: ECHOCARDIOGRAM COMPLETE  Result Date: 01/07/2021    ECHOCARDIOGRAM REPORT   Patient Name:   Steven [name] Date of Exam: 01/07/2021 Medical Rec #:  [id number]       Height:       71.0 in Accession #:    [id number]      Weight:       258.6 lb Date of Birth:  [birth year]       BSA:          2.352 m Patient Age:    69 years        BP:           125/64 mmHg Patient Gender: M               HR:  83 bpm. Exam Location:  Inpatient Procedure: 2D Echo, Cardiac Doppler and Color Doppler Indications:    bacteremia  History:        Patient has prior history of Echocardiogram examinations, most                 recent 10/09/2020. Aortic Valve Disease; Risk                 Factors:Hypertension and Dyslipidemia.  Sonographer:    Cammy Brochure Referring Phys: Earnie Larsson LAMA  Sonographer Comments: Patient is morbidly obese. Image acquisition challenging due to patient body habitus, Image acquisition challenging due to respiratory motion and bandages obscuring windows. IMPRESSIONS  1. Left ventricular ejection fraction, by estimation, is 60 to 65%. The left ventricle has normal function. The left ventricle has no regional wall motion abnormalities. There is moderate left ventricular hypertrophy. Left ventricular diastolic parameters are consistent with Grade II diastolic dysfunction (pseudonormalization).  2. Right ventricular systolic function is normal. The right ventricular size is mildly enlarged. Tricuspid regurgitation signal is inadequate for assessing PA pressure.  3. Left atrial size was mildly dilated.  4. The mitral valve is abnormal with severe calcification of the valve and annulus. Cannot rule out endocarditis from this study. No evidence of mitral valve regurgitation. Moderate mitral stenosis. The mean mitral valve gradient is 6.0  mmHg, MVA 1.7 cm^2 by VTI.  5. Bioprosthetic aortic valve s/p TAVR. No significant peri-valvular leakage noted. Mean gradient 12 mmHg. No vegetation seen on the bioprosthetic aortic valve but cannot fully rule out endocarditis from this study.  6. The inferior vena cava is normal in size with greater than 50% respiratory variability, suggesting right atrial pressure of 3 mmHg.  7. Cannot rule out endocarditis from this study. FINDINGS  Left Ventricle: Left ventricular ejection fraction, by estimation, is 60 to 65%. The left ventricle has normal function. The left ventricle has no regional wall motion abnormalities. The left ventricular internal cavity size was normal in size. There is  moderate left ventricular hypertrophy. Left ventricular diastolic parameters are consistent with Grade II diastolic dysfunction (pseudonormalization). Right Ventricle: The right ventricular size is mildly enlarged. No increase in right ventricular wall thickness. Right ventricular systolic function is normal. Tricuspid regurgitation signal is inadequate for assessing PA pressure. Left [facility]: Left atrial size was mildly dilated. Right [facility]: Right atrial size was normal in size. Pericardium: Trivial pericardial effusion is present. Mitral Valve: The mitral valve is abnormal. There is severe thickening of the mitral valve leaflet(s). There is mild calcification of the mitral valve leaflet(s). No evidence of mitral valve regurgitation. Moderate mitral valve stenosis. MV peak gradient, 14.1 mmHg. The mean mitral valve gradient is 6.0 mmHg. Tricuspid Valve: The tricuspid valve is normal in structure. Tricuspid valve regurgitation is not demonstrated. Aortic Valve: Bioprosthetic aortic valve s/p TAVR. No significant peri-valvular leakage noted. Mean gradient 12 mmHg. The aortic valve has been repaired/replaced. Aortic valve regurgitation is not visualized. Aortic valve mean gradient measures 12.0 mmHg. Aortic valve peak gradient measures  17.5 mmHg. Aortic valve area, by VTI measures 2.01 cm. There is a 26 mm Sapien prosthetic, stented (TAVR) valve present in the aortic position. Pulmonic Valve: The pulmonic valve was normal in structure. Pulmonic valve regurgitation is not visualized. Aorta: The aortic root is normal in size and structure. Venous: The inferior vena cava is normal in size with greater than 50% respiratory variability, suggesting right atrial pressure of 3 mmHg. IAS/Shunts: No atrial level shunt detected by  color flow Doppler.  LEFT VENTRICLE PLAX 2D LVIDd:         5.60 cm  Diastology LVIDs:         3.50 cm  LV e' medial:    4.68 cm/s LV PW:         1.10 cm  LV E/e' medial:  39.3 LV IVS:        1.00 cm  LV e' lateral:   5.98 cm/s LVOT diam:     2.15 cm  LV E/e' lateral: 30.8 LV SV:         81 LV SV Index:   34 LVOT Area:     3.63 cm  RIGHT VENTRICLE             IVC RV Basal diam:  4.90 cm     IVC diam: 1.70 cm RV S prime:     10.90 cm/s TAPSE (M-mode): 1.9 cm LEFT ATRIUM         Index LA diam:    4.50 cm 1.91 cm/m  AORTIC VALVE AV Area (Vmax):    1.87 cm AV Area (Vmean):   1.78 cm AV Area (VTI):     2.01 cm AV Vmax:           209.33 cm/s AV Vmean:          147.333 cm/s AV VTI:            0.402 m AV Peak Grad:      17.5 mmHg AV Mean Grad:      12.0 mmHg LVOT Vmax:         108.00 cm/s LVOT Vmean:        72.300 cm/s LVOT VTI:          0.223 m LVOT/AV VTI ratio: 0.55  AORTA Ao Root diam: 2.70 cm MITRAL VALVE MV Area (PHT): 3.50 cm     SHUNTS MV Area VTI:   1.73 cm     Systemic VTI:  0.22 m MV Peak grad:  14.1 mmHg    Systemic Diam: 2.15 cm MV Mean grad:  6.0 mmHg MV Vmax:       1.88 m/s MV Vmean:      108.0 cm/s MV Decel Time: 217 msec MV E velocity: 184.00 cm/s MV A velocity: 100.00 cm/s MV E/A ratio:  1.84 Loralie Champagne MD Electronically signed by Loralie Champagne MD Signature Date/Time: 01/07/2021/3:08:19 PM    Final     aspirin  81 mg Oral Daily   atorvastatin  10 mg Oral Daily   carvedilol  3.125 mg Oral BID WC   clopidogrel   75 mg Oral Daily   docusate sodium  100 mg Oral Daily   feeding supplement  237 mL Oral TID BM   fenofibrate  160 mg Oral Daily   insulin aspart  0-9 Units Subcutaneous TID WC   insulin detemir  10 Units Subcutaneous Daily   multivitamin with minerals  1 tablet Oral Daily   mycophenolate  180 mg Oral BID   pantoprazole  40 mg Oral Daily   sodium bicarbonate  650 mg Oral BID   sodium chloride flush  3 mL Intravenous Q12H   tacrolimus  1 mg Oral BID    BMET    Component Value Date/Time   NA 138 01/08/2021 0311   NA 139 11/10/2020 1137   K 4.7 01/08/2021 0311   CL 105 01/08/2021 0311   CO2 17 (L) 01/08/2021 0311   GLUCOSE 107 (H) 01/08/2021  [id number]   BUN 82 (H) 01/08/2021 0311   BUN 31 (H) 11/10/2020 1137   CREATININE 5.34 (H) 01/08/2021 0311   CREATININE 1.99 (H) 07/09/2020 1155   CALCIUM 8.2 (L) 01/08/2021 0311   CALCIUM 7.6 (L) 07/12/2007 0400   GFRNONAA 11 (L) 01/08/2021 0311   GFRNONAA 61 10/03/2013 1432   GFRAA 53 (L) 09/16/2020 1404   GFRAA 71 10/03/2013 1432   CBC    Component Value Date/Time   WBC 1.3 (LL) 01/08/2021 0311   RBC 2.64 (L) 01/08/2021 0311   HGB 7.1 (L) 01/08/2021 0311   HCT 22.3 (L) 01/08/2021 0311   PLT 90 (L) 01/08/2021 0311   MCV 84.5 01/08/2021 0311   MCV 85.9 10/03/2013 1354   MCH 26.9 01/08/2021 0311   MCHC 31.8 01/08/2021 0311   RDW 17.2 (H) 01/08/2021 0311   LYMPHSABS 0.4 (L) 01/05/2021 1550   MONOABS 0.4 01/05/2021 1550   EOSABS 0.1 01/05/2021 1550   BASOSABS 0.0 01/05/2021 1550    HPI: Steven [name] is an 65 y.o. 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  Followed by Dr. [name] and baseline Scr 2-2.4 (last 2.4 in April 2022)   Assessment/Plan:    ESRD s/p LRD kidney transplant 15 years ago complicated by AKI/CKD stage IIIb - in setting of decompensated CHF.  [place] with some hydro to transplanted kidney.  Possibly cardiorenal syndrome (supported by low UNa), but also concerning for nephrotic syndrome.  Will obtain 24 hour urine protein.  May need transfer to [facility] for transplant kidney biopsy. Not responding to IV lasix 120 mg tid. BUN and Scr continues to climb.  Discussed the need for HD during this admission since he is not responding to higher dose of lasix. Have consulted IR for tunneled HD cath, however he ate this morning so will have temp HD catheter placed and then plan for serial HD with UF as tolerated to help his SOB and anasarca. Acute hypoxic respiratory failure - secondary to recurrent pleural effusions. Acute on chronic CHF - ECHO in 3/22 showed preserved EF with grade II DD and mildly increased gradient across TAVR.   Increase furosemide to 120 mg IV tid Recommend repeat ECHO to evaluate EF now that he has pulmonary edema as well as anasarca.   Will also order 24 hour urine protein to r/o nephrotic syndrome as cause of anasarca.   Immunosuppression - continue with home dose of prograf and myfortic Due to leukopenia, will decrease myfortic to 180 mg bid but may need to decrease further. Enterococcal Faecalis bacteremia - ID following and managing abx.  TTE pending to r/o SBE.   Anemia of CKD and iron deficiency - low TSAT will order IV feraheme and follow.  Hgb dropped to 6.4 and s/p blood transfusion today. Hyponatremia - mild in setting of volume overload.  Follow with diuresis. Recurrent bilateral pleural effusions - s/p bilateral PleurX catheters on 01/01/21.  Drain per primary svc. HTN - stable DM - per primary Thrombocytopenia - chronic issue  Donetta Potts, MD [facility] [phone]

## 2021-01-08 NOTE — Progress Notes (Signed)
Physical Therapy Treatment Patient Details Name: Steven [name] MRN: [id number] DOB: 20-Jun-1956 Today's Date: 01/08/2021    History of Present Illness Pt is a 65 y.o. male presenting on 01/05/2021 with SOB and low-grade fever. During ED course, pt in acute respiratory distress. CT chest reveals pulmonary edema and pleural effusion R>L. 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    PT Comments    Pt received in sitting and is unable to don his prosthetic secondary to swelling. Pt requires increased physical assistance to perform transfers and quickly becomes fatigued. Pt educated on the need to wear shrinker and elevate residual limb. Pt continues to demonstrate deficits in overall strength, endurance, power, balance, and activity tolerance and will benefit from continued acute PT to improve independence in mobility in an effort to return to his prior level of function. SPT recommends SNF placement at this time due to level of physical assistance required for mobility and for improved safety.    Follow Up Recommendations  SNF     Equipment Recommendations  None recommended by PT    Recommendations for Other Services       Precautions / Restrictions Precautions Precautions: Fall;Other (comment) Precaution Comments: H/o L BKA (prosthetic in room) Restrictions Weight Bearing Restrictions: No    Mobility  Bed Mobility               General bed mobility comments: Pt received on BSC    Transfers Overall transfer level: Needs assistance Equipment used: Rolling walker (2 wheeled);None Transfers: Sit to/from [facility] Sit to Stand: Min assist (2x) Stand pivot transfers: Mod assist;+2 safety/equipment       General transfer comment: Cues for hand placement and device management. Pt with difficulty getting prosthetic on secondary to  swelling.  Ambulation/Gait             General Gait Details: Deferred due to inability to don prosthetic secondary to swelling.   Stairs             Wheelchair Mobility    Modified Rankin (Stroke Patients Only)       Balance Overall balance assessment: Needs assistance Sitting-balance support: Feet supported;Bilateral upper extremity supported;No upper extremity supported Sitting balance-Leahy Scale: Fair     Standing balance support: Bilateral upper extremity supported;During functional activity Standing balance-Leahy Scale: Poor Standing balance comment: Reliant on UE support                            Cognition Arousal/Alertness: Awake/alert Behavior During Therapy: WFL for tasks assessed/performed Overall Cognitive Status: Within Functional Limits for tasks assessed                                        Exercises      General Comments General comments (skin integrity, edema, etc.): Pt received on BSC with [place] off and satting at 84%. Pt placed on 3 L [place], maintaining oxygen sat levels at or above 93%.      Pertinent Vitals/Pain Pain Assessment: No/denies pain    Home Living                      Prior Function            PT Goals (  current goals can now be found in the care plan section) Acute Rehab PT Goals Patient Stated Goal: Get better and go home Progress towards PT goals: Not progressing toward goals - comment (Pt regressing, requiring increased levels of physical assistance to perform transfers.)    Frequency    Min 3X/week      PT Plan Current plan remains appropriate    Co-evaluation              AM-PAC PT "6 Clicks" Mobility   Outcome Measure  Help needed turning from your back to your side while in a flat bed without using bedrails?: A Little Help needed moving from lying on your back to sitting on the side of a flat bed without using bedrails?: A Little Help needed moving to and  from a bed to a chair (including a wheelchair)?: A Lot Help needed standing up from a chair using your arms (e.g., wheelchair or bedside chair)?: A Little Help needed to walk in hospital room?: A Lot Help needed climbing 3-5 steps with a railing? : Total 6 Click Score: 14    End of Session Equipment Utilized During Treatment: Gait belt;Oxygen Activity Tolerance: Patient limited by fatigue Patient left: in chair;with call bell/phone within reach;with chair alarm set;with family/visitor present Nurse Communication: Mobility status PT Visit Diagnosis: Other abnormalities of gait and mobility (R26.89);Muscle weakness (generalized) (M62.81);Difficulty in walking, not elsewhere classified (R26.2)     Time: [id number] PT Time Calculation (min) (ACUTE ONLY): 37 min  Charges:  $Therapeutic Activity: 23-37 mins                     Acute Rehab  Pager: [phone]    Garwin Brothers, SPT  01/08/2021, 1:12 PM

## 2021-01-08 NOTE — Progress Notes (Signed)
Incontinence care provided. Bed linens changed.

## 2021-01-08 NOTE — Procedures (Signed)
Interventional Radiology Procedure Note  Procedure: Right temp HD catheter placed  Complications: None  Estimated Blood Loss: None  Recommendations: - Routine line care     Signed,  Criselda Peaches, MD

## 2021-01-08 NOTE — Progress Notes (Signed)
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  After drainage patient started to have chest discomfort.  Yesterday home health RN noticed that patient was having increasing swelling of lower extremities so she did not drain the Pleurx catheter and asked him to go to the ED for further evaluation.  In the ED CT chest showed pulmonary edema and pleural effusion right more than left, worsening kidney function with creatinine 4.7 compared to 4.3, 3 days ago.  He was started on vancomycin and cefepime for possible HCAP. Antibiotics were discontinued as infection was not suspected by the admitting physician.   Subjective   Patient seen and examined, feeling little better this morning.  WBC has been consistently going down.  Today WBC is 1.3.   Assessment/Plan:     Acute on chronic diastolic CHF -Nephrology had increased Lasix to 120 mg 3 times daily -If no improvement, might require hemodialysis -Echocardiogram obtained shows EF 60 to 65%, moderate LVH, grade 2 left ventricle diastolic dysfunction -No improvement with Lasix so likely will require hemodialysis. -Nephrology working to get temporary hemodialysis catheter for dialysis   Leukopenia; pancytopenia -Secondary to Myfortic -Nephrology is adjusting the dose of Myfortic -We will start neutropenic precautions -Patient is already on antibiotics, ID following   Bilateral pleural effusion -Likely  from underlying congestive heart failure -Has Pleurx catheter placed bilaterally -Continue  Pleurx catheter drainage every other day   Enterococcus bacteremia -ID has been consulted -Patient started on ceftriaxone, ampicillin -TTE shows no vegetation -Might require TEE    CKD stage IV -He has proteinuria -Renal function is worsening, BUN 82/creatinine 5.34 -Nephrology following -Might have to start hemodialysis   Diabetes mellitus type 2 -We will hold glipizide -Continue Levemir 10 units subcu daily -Sliding scale insulin with NovoLog    Scheduled medications:    aspirin  81 mg Oral Daily   atorvastatin  10 mg Oral Daily   carvedilol  3.125 mg Oral BID WC   [START ON 01/09/2021] Chlorhexidine Gluconate Cloth  6 each Topical Q0600   clopidogrel  75 mg Oral Daily   docusate sodium  100 mg Oral Daily   feeding supplement  237 mL Oral TID BM   fenofibrate  160 mg Oral Daily   insulin aspart  0-9 Units Subcutaneous TID WC   insulin detemir  10 Units Subcutaneous Daily   multivitamin with minerals  1 tablet Oral Daily   mycophenolate  180 mg Oral BID   pantoprazole  40 mg Oral Daily   sodium bicarbonate  650 mg Oral BID   sodium chloride flush  3 mL Intravenous Q12H   tacrolimus  1 mg Oral BID         Data Reviewed:   CBG:  Recent Labs  Lab 01/07/21 1642 01/07/21 2134 01/08/21 0601 01/08/21 1122 01/08/21 1608  GLUCAP 160* 137* 127* 143* 113*    SpO2: 99 % O2 Flow Rate (L/min): 2 L/min    Vitals:   01/08/21 0514 01/08/21 0714 01/08/21 1123 01/08/21 1609  BP: 134/62 132/63 119/62 131/60  Pulse: 85 83 86 78  Resp: '20 17 15 20  '$ Temp: 97.9 F (36.6 C) 98 F (36.7 C) 98 F (36.7 C) 98.3 F (36.8 C)  TempSrc: Oral Oral Oral Oral  SpO2: 100% 98% 97% 99%  Weight: 118.2 kg     Height:         Intake/Output Summary (Last 24 hours) at 01/08/2021 1625 Last data filed at 01/08/2021 1536 Gross per 24 hour  Intake 1584.59 ml  Output --  Net 1584.59 ml     06/08 1901 - 06/10 0700 In: 2458.1 [P.O.:720] Out: 700 [Urine:700]  Filed Weights   01/06/21 0103 01/07/21 0000 01/08/21 0514  Weight: 118.2 kg 117.3 kg 118.2 kg    CBC:  Recent Labs  Lab 01/05/21 1550 01/07/21 0527 01/07/21 1601 01/08/21 0311  WBC 4.9 1.6*  --  1.3*  HGB 8.7* 6.4* 7.3* 7.1*  HCT 26.7* 20.3* 22.9* 22.3*  PLT 105* 98*  --  90*  MCV 80.9 82.9  --  84.5  MCH 26.4 26.1  --  26.9  MCHC 32.6 31.5  --  31.8  RDW 17.0* 17.3*  --  17.2*  LYMPHSABS 0.4*  --   --   --   MONOABS 0.4  --   --   --   EOSABS 0.1  --   --   --   BASOSABS 0.0  --   --   --     Complete metabolic panel:  Recent Labs  Lab 01/05/21 1550 01/05/21 1636 01/05/21 1950 01/06/21 0240 01/07/21 0527 01/08/21 0311  NA 130*  --   --  132* 137 138  K 4.2  --   --  4.2 4.6 4.7  CL 102  --   --  102 102 105  CO2 19*  --   --  16* 21* 17*  GLUCOSE 193*  --   --  175* 143* 107*  BUN 71*  --   --  77* 80* 82*  CREATININE 4.71*  --   --  4.72* 5.32* 5.34*  CALCIUM 7.3*  --   --  7.7* 8.2* 8.2*  AST 18  --   --   --  21  --   ALT 12  --   --   --  15  --   ALKPHOS 129*  --   --   --  92  --   BILITOT 0.5  --   --   --  0.6  --   ALBUMIN 1.8*  --   --   --  2.5* 2.8*  LATICACIDVEN  --  1.0 0.8  --   --   --   INR 1.2  --   --   --   --   --     No results for input(s): LIPASE, AMYLASE in the last 168 hours.  Recent Labs  Lab 01/05/21 2057  [place]    ------------------------------------------------------------------------------------------------------------------ Recent Labs    01/06/21 0240  CHOL 83  HDL 26*  LDLCALC 39  TRIG 92  CHOLHDL 3.2    Lab Results  Component Value Date   HGBA1C 5.2 12/31/2020   ------------------------------------------------------------------------------------------------------------------ No results for input(s): TSH, T4TOTAL, T3FREE, THYROIDAB in the last 72 hours.  Invalid input(s):  FREET3 ------------------------------------------------------------------------------------------------------------------ Recent Labs    01/05/21 1930  FERRITIN 405*  TIBC 155*  IRON 14*    Coagulation profile Recent Labs  Lab 01/05/21 1550  INR 1.2  No results for input(s): DDIMER in the last 72 hours.  Cardiac Enzymes No results for input(s): CKTOTAL, CKMB, CKMBINDEX, TROPONINI in the last 168 hours.  ------------------------------------------------------------------------------------------------------------------    Component Value Date/Time   BNP 1,403.3 (H) 06/04/2020 [id number]     Antibiotics: Anti-infectives (From admission, onward)    Start     Dose/Rate Route Frequency Ordered Stop   01/06/21 1500  cefTRIAXone (ROCEPHIN) 2 g in sodium chloride 0.9 % 100 mL IVPB        2 g 200 mL/hr over 30 Minutes Intravenous Every 12 hours 01/06/21 1204     01/06/21 1400  ampicillin (OMNIPEN) 2 g in sodium chloride 0.9 % 100 mL IVPB  Status:  Discontinued        2 g 300 mL/hr over 20 Minutes Intravenous Every 8 hours 01/06/21 1204 01/06/21 1205   01/06/21 1300  ampicillin (OMNIPEN) 2 g in sodium chloride 0.9 % 100 mL IVPB        2 g 300 mL/hr over 20 Minutes Intravenous Every 8 hours 01/06/21 1205     01/05/21 2200  doxycycline (VIBRA-TABS) tablet 100 mg  Status:  Discontinued        100 mg Oral Every 12 hours 01/05/21 1849 01/06/21 1433   01/05/21 1630  vancomycin (VANCOREADY) IVPB 2000 mg/400 mL        2,000 mg 200 mL/hr over 120 Minutes Intravenous  Once 01/05/21 1616 01/05/21 2015   01/05/21 1630  ceFEPIme (MAXIPIME) 2 g in sodium chloride 0.9 % 100 mL IVPB        2 g 200 mL/hr over 30 Minutes Intravenous  Once 01/05/21 1616 01/05/21 1742        Radiology Reports  ECHOCARDIOGRAM COMPLETE  Result Date: 01/07/2021    ECHOCARDIOGRAM REPORT   Patient Name:   DEXTER WILBOURNE Date of Exam: 01/07/2021 Medical Rec #:  [id number]       Height:       71.0 in Accession #:     [id number]      Weight:       258.6 lb Date of Birth:  1955/12/29       BSA:          2.352 m Patient Age:    79 years        BP:           125/64 mmHg Patient Gender: M               HR:           83 bpm. Exam Location:  Inpatient Procedure: 2D Echo, Cardiac Doppler and Color Doppler Indications:    bacteremia  History:        Patient has prior history of Echocardiogram examinations, most                 recent 10/09/2020. Aortic Valve Disease; Risk                 Factors:Hypertension and Dyslipidemia.  Sonographer:    Cammy Brochure Referring Phys: Earnie Larsson Tykia Mellone  Sonographer Comments: Patient is morbidly obese. Image acquisition challenging due to patient body habitus, Image acquisition challenging due to respiratory motion and bandages obscuring windows. IMPRESSIONS  1. Left ventricular ejection fraction, by estimation, is 60 to 65%. The left ventricle has normal function. The left ventricle has no regional wall motion abnormalities. There is moderate left ventricular hypertrophy. Left ventricular diastolic parameters are consistent with Grade II diastolic dysfunction (  pseudonormalization).  2. Right ventricular systolic function is normal. The right ventricular size is mildly enlarged. Tricuspid regurgitation signal is inadequate for assessing PA pressure.  3. Left atrial size was mildly dilated.  4. The mitral valve is abnormal with severe calcification of the valve and annulus. Cannot rule out endocarditis from this study. No evidence of mitral valve regurgitation. Moderate mitral stenosis. The mean mitral valve gradient is 6.0 mmHg, MVA 1.7 cm^2 by VTI.  5. Bioprosthetic aortic valve s/p TAVR. No significant peri-valvular leakage noted. Mean gradient 12 mmHg. No vegetation seen on the bioprosthetic aortic valve but cannot fully rule out endocarditis from this study.  6. The inferior vena cava is normal in size with greater than 50% respiratory variability, suggesting right atrial pressure of 3 mmHg.   7. Cannot rule out endocarditis from this study. FINDINGS  Left Ventricle: Left ventricular ejection fraction, by estimation, is 60 to 65%. The left ventricle has normal function. The left ventricle has no regional wall motion abnormalities. The left ventricular internal cavity size was normal in size. There is  moderate left ventricular hypertrophy. Left ventricular diastolic parameters are consistent with Grade II diastolic dysfunction (pseudonormalization). Right Ventricle: The right ventricular size is mildly enlarged. No increase in right ventricular wall thickness. Right ventricular systolic function is normal. Tricuspid regurgitation signal is inadequate for assessing PA pressure. Left Atrium: Left atrial size was mildly dilated. Right Atrium: Right atrial size was normal in size. Pericardium: Trivial pericardial effusion is present. Mitral Valve: The mitral valve is abnormal. There is severe thickening of the mitral valve leaflet(s). There is mild calcification of the mitral valve leaflet(s). No evidence of mitral valve regurgitation. Moderate mitral valve stenosis. MV peak gradient, 14.1 mmHg. The mean mitral valve gradient is 6.0 mmHg. Tricuspid Valve: The tricuspid valve is normal in structure. Tricuspid valve regurgitation is not demonstrated. Aortic Valve: Bioprosthetic aortic valve s/p TAVR. No significant peri-valvular leakage noted. Mean gradient 12 mmHg. The aortic valve has been repaired/replaced. Aortic valve regurgitation is not visualized. Aortic valve mean gradient measures 12.0 mmHg. Aortic valve peak gradient measures 17.5 mmHg. Aortic valve area, by VTI measures 2.01 cm. There is a 26 mm Sapien prosthetic, stented (TAVR) valve present in the aortic position. Pulmonic Valve: The pulmonic valve was normal in structure. Pulmonic valve regurgitation is not visualized. Aorta: The aortic root is normal in size and structure. Venous: The inferior vena cava is normal in size with greater than 50%  respiratory variability, suggesting right atrial pressure of 3 mmHg. IAS/Shunts: No atrial level shunt detected by color flow Doppler.  LEFT VENTRICLE PLAX 2D LVIDd:         5.60 cm  Diastology LVIDs:         3.50 cm  LV e' medial:    4.68 cm/s LV PW:         1.10 cm  LV E/e' medial:  39.3 LV IVS:        1.00 cm  LV e' lateral:   5.98 cm/s LVOT diam:     2.15 cm  LV E/e' lateral: 30.8 LV SV:         81 LV SV Index:   34 LVOT Area:     3.63 cm  RIGHT VENTRICLE             IVC RV Basal diam:  4.90 cm     IVC diam: 1.70 cm RV S prime:     10.90 cm/s TAPSE (M-mode): 1.9 cm LEFT ATRIUM  Index LA diam:    4.50 cm 1.91 cm/m  AORTIC VALVE AV Area (Vmax):    1.87 cm AV Area (Vmean):   1.78 cm AV Area (VTI):     2.01 cm AV Vmax:           209.33 cm/s AV Vmean:          147.333 cm/s AV VTI:            0.402 m AV Peak Grad:      17.5 mmHg AV Mean Grad:      12.0 mmHg LVOT Vmax:         108.00 cm/s LVOT Vmean:        72.300 cm/s LVOT VTI:          0.223 m LVOT/AV VTI ratio: 0.55  AORTA Ao Root diam: 2.70 cm MITRAL VALVE MV Area (PHT): 3.50 cm     SHUNTS MV Area VTI:   1.73 cm     Systemic VTI:  0.22 m MV Peak grad:  14.1 mmHg    Systemic Diam: 2.15 cm MV Mean grad:  6.0 mmHg MV Vmax:       1.88 m/s MV Vmean:      108.0 cm/s MV Decel Time: 217 msec MV E velocity: 184.00 cm/s MV A velocity: 100.00 cm/s MV E/A ratio:  1.84 Loralie Champagne MD Electronically signed by Loralie Champagne MD Signature Date/Time: 01/07/2021/3:08:19 PM    Final        DVT prophylaxis: Foot pumps  Code Status: Full code  Family Communication: No family at bedside   Consultants: Nephrology  Procedures:     Objective    Physical Examination:  General-appears in no acute distress Heart-S1-S2, regular Lungs-decreased breath sounds bilaterally Abdomen-soft, nontender, no organomegaly Extremities-3+ pitting edema in the lower extremities Neuro-alert, oriented x3, no focal deficit noted  Status is: Inpatient  Dispo: The  patient is from: Home              Anticipated d/c is to: Home              Anticipated d/c date is: 01/10/2021              Patient currently not stable for discharge  Barrier to discharge-ongoing acute CHF  COVID-19 Labs  Recent Labs    01/05/21 1930  FERRITIN 405*  LDH 190    Lab Results  Component Value Date   [place] NEGATIVE 01/05/2021   [place] NEGATIVE 12/09/2020   [place] NEGATIVE 10/26/2020   [place] NEGATIVE 09/05/2020    Microbiology  Recent Results (from the past 240 hour(s))  Surgical pcr screen     Status: Abnormal   Collection Time: 12/31/20  8:46 AM   Specimen: Nasal Mucosa; Nasal Swab  Result Value Ref Range Status   MRSA, PCR NEGATIVE NEGATIVE Final   Staphylococcus aureus POSITIVE (A) NEGATIVE Final    Comment: (NOTE) The Xpert SA Assay (FDA approved for NASAL specimens in patients 31 years of age and older), is one component of a comprehensive surveillance program. It is not intended to diagnose infection nor to guide or monitor treatment. Performed at [facility], [place] [address] [place], [place] [address]   Culture, blood (x 2)     Status: Abnormal   Collection Time: 01/05/21  4:36 PM   Specimen: BLOOD LEFT FOREARM  Result Value Ref Range Status   Specimen Description BLOOD LEFT FOREARM  Final   Special Requests   Final  BOTTLES DRAWN AEROBIC AND ANAEROBIC Blood Culture adequate volume   Culture  Setup Time   Final    GRAM POSITIVE COCCI IN BOTH AEROBIC AND ANAEROBIC BOTTLES CRITICAL RESULT CALLED TO, READ BACK BY AND VERIFIED WITH: PHARMD JEREMY FRENS AT [place] ON 01/06/21 BY KJ Performed at [facility], [place] [address], [place], [place] [address]    Culture ENTEROCOCCUS FAECALIS (A)  Final   Report Status 01/08/2021 FINAL  Final   Organism ID, Bacteria ENTEROCOCCUS FAECALIS  Final      Susceptibility   Enterococcus faecalis - MIC*    AMPICILLIN <=2 SENSITIVE Sensitive     VANCOMYCIN 1 SENSITIVE  Sensitive     GENTAMICIN SYNERGY SENSITIVE Sensitive     * ENTEROCOCCUS FAECALIS  Blood Culture ID Panel (Reflexed)     Status: Abnormal   Collection Time: 01/05/21  4:36 PM  Result Value Ref Range Status   Enterococcus faecalis DETECTED (A) NOT DETECTED Final    Comment: CRITICAL RESULT CALLED TO, READ BACK BY AND VERIFIED WITH: PHARMD JEREMY FRENS AT [id number] ON 6/85/22 BY KJ    Enterococcus Faecium NOT DETECTED NOT DETECTED Corrected    Comment: CORRECTED ON 06/08 AT 1234: PREVIOUSLY REPORTED AS NOT DETECTED CRITICAL RESULT CALLED TO, READ BACK BY AND VERIFIED WITH: [place] AT [id number] ON 6/85/22 BY KJ   Listeria monocytogenes NOT DETECTED NOT DETECTED Final   Staphylococcus species NOT DETECTED NOT DETECTED Final   Staphylococcus aureus (BCID) NOT DETECTED NOT DETECTED Final   Staphylococcus epidermidis NOT DETECTED NOT DETECTED Final   Staphylococcus lugdunensis NOT DETECTED NOT DETECTED Final   Streptococcus species NOT DETECTED NOT DETECTED Final   Streptococcus agalactiae NOT DETECTED NOT DETECTED Final   Streptococcus pneumoniae NOT DETECTED NOT DETECTED Final   Streptococcus pyogenes NOT DETECTED NOT DETECTED Final   A.calcoaceticus-baumannii NOT DETECTED NOT DETECTED Final   Bacteroides fragilis NOT DETECTED NOT DETECTED Final   Enterobacterales NOT DETECTED NOT DETECTED Final   Enterobacter cloacae complex NOT DETECTED NOT DETECTED Final   Escherichia coli NOT DETECTED NOT DETECTED Final   Klebsiella aerogenes NOT DETECTED NOT DETECTED Final   Klebsiella oxytoca NOT DETECTED NOT DETECTED Final   Klebsiella pneumoniae NOT DETECTED NOT DETECTED Final   Proteus species NOT DETECTED NOT DETECTED Final   Salmonella species NOT DETECTED NOT DETECTED Final   Serratia marcescens NOT DETECTED NOT DETECTED Final   Haemophilus influenzae NOT DETECTED NOT DETECTED Final   Neisseria meningitidis NOT DETECTED NOT DETECTED Final   Pseudomonas aeruginosa NOT DETECTED NOT DETECTED  Final   Stenotrophomonas maltophilia NOT DETECTED NOT DETECTED Final   Candida albicans NOT DETECTED NOT DETECTED Final   Candida auris NOT DETECTED NOT DETECTED Final   Candida glabrata NOT DETECTED NOT DETECTED Final   Candida krusei NOT DETECTED NOT DETECTED Final   Candida parapsilosis NOT DETECTED NOT DETECTED Final   Candida tropicalis NOT DETECTED NOT DETECTED Final   Cryptococcus neoformans/gattii NOT DETECTED NOT DETECTED Final   Vancomycin resistance NOT DETECTED NOT DETECTED Final    Comment: Performed at [facility], [place]. [address], [place], [place] [address]  Culture, blood (x 2)     Status: None (Preliminary result)   Collection Time: 01/05/21  7:26 PM   Specimen: BLOOD  Result Value Ref Range Status   Specimen Description BLOOD SITE NOT SPECIFIED  Final   Special Requests   Final    BOTTLES DRAWN AEROBIC AND ANAEROBIC Blood Culture results may  not be optimal due to an inadequate volume of blood received in culture bottles   Culture   Final    NO GROWTH 3 DAYS Performed at [facility], [place] [address], [place], [place] [address]    Report Status PENDING  Incomplete  Resp Panel by RT-PCR (Flu A&B, Covid) Nasopharyngeal Swab     Status: None   Collection Time: 01/05/21  8:57 PM   Specimen: Nasopharyngeal Swab; Nasopharyngeal(NP) swabs in vial transport medium  Result Value Ref Range Status   SARS Coronavirus 2 by RT PCR NEGATIVE NEGATIVE Final    Comment: (NOTE) SARS-CoV-2 target nucleic acids are NOT DETECTED.  The SARS-CoV-2 RNA is generally detectable in upper respiratory specimens during the acute phase of infection. The lowest concentration of SARS-CoV-2 viral copies this assay can detect is 138 copies/mL. A negative result does not preclude SARS-Cov-2 infection and should not be used as the sole basis for treatment or other patient management decisions. A negative result may occur with  improper specimen collection/handling, submission of specimen  other than nasopharyngeal swab, presence of viral mutation(s) within the areas targeted by this assay, and inadequate number of viral copies(<138 copies/mL). A negative result must be combined with clinical observations, patient history, and epidemiological information. The expected result is Negative.  Fact Sheet for Patients:  EntrepreneurPulse.com.au  Fact Sheet for Healthcare Providers:  IncredibleEmployment.be  This test is no t yet approved or cleared by the [place] FDA and  has been authorized for detection and/or diagnosis of SARS-CoV-2 by FDA under an Emergency Use Authorization (EUA). This EUA will remain  in effect (meaning this test can be used) for the duration of the COVID-19 declaration under Section 564(b)(1) of the Act, 21 U.S.C.section 360bbb-3(b)(1), unless the authorization is terminated  or revoked sooner.       Influenza A by PCR NEGATIVE NEGATIVE Final   Influenza B by PCR NEGATIVE NEGATIVE Final    Comment: (NOTE) The Xpert Xpress SARS-CoV-2/FLU/RSV plus assay is intended as an aid in the diagnosis of influenza from Nasopharyngeal swab specimens and should not be used as a sole basis for treatment. Nasal washings and aspirates are unacceptable for Xpert Xpress SARS-CoV-2/FLU/RSV testing.  Fact Sheet for Patients: EntrepreneurPulse.com.au  Fact Sheet for Healthcare Providers: IncredibleEmployment.be  This test is not yet approved or cleared by the [place] FDA and has been authorized for detection and/or diagnosis of SARS-CoV-2 by FDA under an Emergency Use Authorization (EUA). This EUA will remain in effect (meaning this test can be used) for the duration of the COVID-19 declaration under Section 564(b)(1) of the Act, 21 U.S.C. section 360bbb-3(b)(1), unless the authorization is terminated or revoked.  Performed at [facility], [place] [address], [place],  [place] [address]   Respiratory (~20 pathogens) panel by PCR     Status: None   Collection Time: 01/05/21 11:26 PM  Result Value Ref Range Status   Adenovirus NOT DETECTED NOT DETECTED Final   Coronavirus 229E NOT DETECTED NOT DETECTED Final    Comment: (NOTE) The Coronavirus on the Respiratory Panel, DOES NOT test for the novel  Coronavirus (2019 nCoV)    Coronavirus HKU1 NOT DETECTED NOT DETECTED Final   Coronavirus NL63 NOT DETECTED NOT DETECTED Final   Coronavirus OC43 NOT DETECTED NOT DETECTED Final   Metapneumovirus NOT DETECTED NOT DETECTED Final   Rhinovirus / Enterovirus NOT DETECTED NOT DETECTED Final   Influenza A NOT DETECTED NOT DETECTED Final   Influenza B NOT DETECTED  NOT DETECTED Final   Parainfluenza Virus 1 NOT DETECTED NOT DETECTED Final   Parainfluenza Virus 2 NOT DETECTED NOT DETECTED Final   Parainfluenza Virus 3 NOT DETECTED NOT DETECTED Final   Parainfluenza Virus 4 NOT DETECTED NOT DETECTED Final   Respiratory Syncytial Virus NOT DETECTED NOT DETECTED Final   Bordetella pertussis NOT DETECTED NOT DETECTED Final   Bordetella Parapertussis NOT DETECTED NOT DETECTED Final   Chlamydophila pneumoniae NOT DETECTED NOT DETECTED Final   Mycoplasma pneumoniae NOT DETECTED NOT DETECTED Final    Comment: Performed at [facility], [place] [address], [place], [place] [address]  Culture, blood (routine x 2)     Status: None (Preliminary result)   Collection Time: 01/07/21  5:27 AM   Specimen: BLOOD  Result Value Ref Range Status   Specimen Description BLOOD RIGHT ANTECUBITAL  Final   Special Requests   Final    BOTTLES DRAWN AEROBIC AND ANAEROBIC Blood Culture adequate volume   Culture   Final    NO GROWTH 1 DAY Performed at [facility], [place] [address], [place], [place] [address]    Report Status PENDING  Incomplete  Culture, blood (routine x 2)     Status: None (Preliminary result)   Collection Time: 01/07/21  5:27 AM   Specimen: BLOOD RIGHT HAND  Result  Value Ref Range Status   Specimen Description BLOOD RIGHT HAND  Final   Special Requests   Final    BOTTLES DRAWN AEROBIC AND ANAEROBIC Blood Culture adequate volume   Culture   Final    NO GROWTH 1 DAY Performed at [facility], [place] [address] [place], [place] [address]    Report Status PENDING  Incomplete    Pressure Injury 06/08/20 Coccyx Stage 1 -  Intact skin with non-blanchable redness of a localized area usually over a bony prominence. (Active)  06/08/20 1500  Location: Coccyx  Location Orientation:   Staging: Stage 1 -  Intact skin with non-blanchable redness of a localized area usually over a bony prominence.  Wound Description (Comments):   Present on Admission: Yes (present upon transfer)          [facility] If 7PM-7AM, please contact night-coverage at [email or web address], Office  [phone]   01/08/2021, 4:25 PM  LOS: 3 days

## 2021-01-08 NOTE — Progress Notes (Signed)
[facility]  Date of Admission:  01/05/2021   Total days of antibiotics 4         ASSESSMENT: Patient is afebrile overnight.  TTE cannot rule out endocarditis of the mitral valve and prosthetic aortic valve.  Will order TEE to follow-up, which will determine the duration of treatment.  Continue ampicillin and ceftriaxone for Enterococcus bacteremia.  Repeat blood cultures negative to date.  PLAN: Continue IV ampicillin and ceftriaxone Ordered TEE through card master Follow-up on repeat blood culture  Active Problems:   AKI (acute kidney injury) ([place])   Acute on chronic diastolic CHF (congestive heart failure) ([facility])   CHF (congestive heart failure) ([facility])   Scheduled Meds:  aspirin  81 mg Oral Daily   atorvastatin  10 mg Oral Daily   carvedilol  3.125 mg Oral BID WC   clopidogrel  75 mg Oral Daily   docusate sodium  100 mg Oral Daily   feeding supplement  237 mL Oral TID BM   fenofibrate  160 mg Oral Daily   insulin aspart  0-9 Units Subcutaneous TID WC   insulin detemir  10 Units Subcutaneous Daily   multivitamin with minerals  1 tablet Oral Daily   mycophenolate  180 mg Oral BID   pantoprazole  40 mg Oral Daily   sodium bicarbonate  650 mg Oral BID   sodium chloride flush  3 mL Intravenous Q12H   tacrolimus  1 mg Oral BID   Continuous Infusions:  sodium chloride Stopped (01/06/21 1621)   albumin human 25 g (01/08/21 0835)   ampicillin (OMNIPEN) IV 2 g (01/08/21 0523)   cefTRIAXone (ROCEPHIN)  IV 2 g (01/08/21 [id number])   furosemide 120 mg (01/08/21 0838)   PRN Meds:.sodium chloride, acetaminophen, acetaminophen, cyclobenzaprine, labetalol, ondansetron (ZOFRAN) IV, sodium chloride flush, traMADol, traZODone   SUBJECTIVE: Patient is seen at bedside.  He said that he has been feeling more fatigue.  States that his breathing is improving.  Denies fever overnight.  Review of Systems: ROS Per HPI  Allergies  Allergen Reactions   Sulfa  Antibiotics Rash    OBJECTIVE: Vitals:   01/07/21 2000 01/08/21 0000 01/08/21 0514 01/08/21 0714  BP: (!) 131/56 135/64 134/62 132/63  Pulse: 82 81 85 83  Resp: (!) '24 20 20 17  '$ Temp: 98.3 F (36.8 C) (!) 97.3 F (36.3 C) 97.9 F (36.6 C) 98 F (36.7 C)  TempSrc: Oral Oral Oral Oral  SpO2: 93% 99% 100% 98%  Weight:   118.2 kg   Height:       Body mass index is 36.34 kg/m.  Physical Exam Constitutional:      General: He is not in acute distress.    Appearance: He is ill-appearing.  HENT:     Head: Normocephalic.  Eyes:     General: No scleral icterus.       Right eye: No discharge.        Left eye: No discharge.     Conjunctiva/sclera: Conjunctivae normal.  Cardiovascular:     Rate and Rhythm: Normal rate and regular rhythm.     Heart sounds: Murmur (2/6 systolic murmur at upper sternal border) heard.     Comments: +1 lower extremity edema Pulmonary:     Effort: Pulmonary effort is normal. No respiratory distress.  Skin:    General: Skin is warm.     Coloration: Skin is not jaundiced.  Neurological:     Mental  Status: He is alert. Mental status is at baseline.    Lab Results Lab Results  Component Value Date   WBC 1.3 (LL) 01/08/2021   HGB 7.1 (L) 01/08/2021   HCT 22.3 (L) 01/08/2021   MCV 84.5 01/08/2021   PLT 90 (L) 01/08/2021    Lab Results  Component Value Date   CREATININE 5.34 (H) 01/08/2021   BUN 82 (H) 01/08/2021   NA 138 01/08/2021   K 4.7 01/08/2021   CL 105 01/08/2021   CO2 17 (L) 01/08/2021    Lab Results  Component Value Date   ALT 15 01/07/2021   AST 21 01/07/2021   ALKPHOS 92 01/07/2021   BILITOT 0.6 01/07/2021     Microbiology: Recent Results (from the past 240 hour(s))  Surgical pcr screen     Status: Abnormal   Collection Time: 12/31/20  8:46 AM   Specimen: Nasal Mucosa; Nasal Swab  Result Value Ref Range Status   MRSA, PCR NEGATIVE NEGATIVE Final   Staphylococcus aureus POSITIVE (A) NEGATIVE Final    Comment:  (NOTE) The Xpert SA Assay (FDA approved for NASAL specimens in patients 84 years of age and older), is one component of a comprehensive surveillance program. It is not intended to diagnose infection nor to guide or monitor treatment. Performed at [facility], [place] [address], [place], [place] [address]   Culture, blood (x 2)     Status: Abnormal   Collection Time: 01/05/21  4:36 PM   Specimen: BLOOD LEFT FOREARM  Result Value Ref Range Status   Specimen Description BLOOD LEFT FOREARM  Final   Special Requests   Final    BOTTLES DRAWN AEROBIC AND ANAEROBIC Blood Culture adequate volume   Culture  Setup Time   Final    GRAM POSITIVE COCCI IN BOTH AEROBIC AND ANAEROBIC BOTTLES CRITICAL RESULT CALLED TO, READ BACK BY AND VERIFIED WITH: PHARMD JEREMY FRENS AT [id number] [place] ON 01/06/21 BY KJ Performed at [facility], [place] [address] [place], [place] [address]    Culture ENTEROCOCCUS FAECALIS (A)  Final   Report Status 01/08/2021 FINAL  Final   Organism ID, Bacteria ENTEROCOCCUS FAECALIS  Final      Susceptibility   Enterococcus faecalis - MIC*    AMPICILLIN <=2 SENSITIVE Sensitive     VANCOMYCIN 1 SENSITIVE Sensitive     GENTAMICIN SYNERGY SENSITIVE Sensitive     * ENTEROCOCCUS FAECALIS  Blood Culture ID Panel (Reflexed)     Status: Abnormal   Collection Time: 01/05/21  4:36 PM  Result Value Ref Range Status   Enterococcus faecalis DETECTED (A) NOT DETECTED Final    Comment: CRITICAL RESULT CALLED TO, READ BACK BY AND VERIFIED WITH: PHARMD JEREMY FRENS AT [id number] ON 6/85/22 BY KJ    Enterococcus Faecium NOT DETECTED NOT DETECTED Corrected    Comment: CORRECTED ON 06/08 AT 1234: PREVIOUSLY REPORTED AS NOT DETECTED CRITICAL RESULT CALLED TO, READ BACK BY AND VERIFIED WITH: PHARMD JEREMY FRENS AT [id number] ON 6/85/22 BY KJ   Listeria monocytogenes NOT DETECTED NOT DETECTED Final   Staphylococcus species NOT DETECTED NOT DETECTED Final   Staphylococcus aureus (BCID) NOT DETECTED NOT  DETECTED Final   Staphylococcus epidermidis NOT DETECTED NOT DETECTED Final   Staphylococcus lugdunensis NOT DETECTED NOT DETECTED Final   Streptococcus species NOT DETECTED NOT DETECTED Final   Streptococcus agalactiae NOT DETECTED NOT DETECTED Final   Streptococcus pneumoniae NOT DETECTED NOT DETECTED Final   Streptococcus pyogenes NOT DETECTED NOT  DETECTED Final   A.calcoaceticus-baumannii NOT DETECTED NOT DETECTED Final   Bacteroides fragilis NOT DETECTED NOT DETECTED Final   Enterobacterales NOT DETECTED NOT DETECTED Final   Enterobacter cloacae complex NOT DETECTED NOT DETECTED Final   Escherichia coli NOT DETECTED NOT DETECTED Final   Klebsiella aerogenes NOT DETECTED NOT DETECTED Final   Klebsiella oxytoca NOT DETECTED NOT DETECTED Final   Klebsiella pneumoniae NOT DETECTED NOT DETECTED Final   Proteus species NOT DETECTED NOT DETECTED Final   Salmonella species NOT DETECTED NOT DETECTED Final   Serratia marcescens NOT DETECTED NOT DETECTED Final   Haemophilus influenzae NOT DETECTED NOT DETECTED Final   Neisseria meningitidis NOT DETECTED NOT DETECTED Final   Pseudomonas aeruginosa NOT DETECTED NOT DETECTED Final   Stenotrophomonas maltophilia NOT DETECTED NOT DETECTED Final   Candida albicans NOT DETECTED NOT DETECTED Final   Candida auris NOT DETECTED NOT DETECTED Final   Candida glabrata NOT DETECTED NOT DETECTED Final   Candida krusei NOT DETECTED NOT DETECTED Final   Candida parapsilosis NOT DETECTED NOT DETECTED Final   Candida tropicalis NOT DETECTED NOT DETECTED Final   Cryptococcus neoformans/gattii NOT DETECTED NOT DETECTED Final   Vancomycin resistance NOT DETECTED NOT DETECTED Final    Comment: Performed at [facility], [address], [place], [place] [address]  Culture, blood (x 2)     Status: None (Preliminary result)   Collection Time: 01/05/21  7:26 PM   Specimen: BLOOD  Result Value Ref Range Status   Specimen Description BLOOD SITE NOT SPECIFIED   Final   Special Requests   Final    BOTTLES DRAWN AEROBIC AND ANAEROBIC Blood Culture results may not be optimal due to an inadequate volume of blood received in culture bottles   Culture   Final    NO GROWTH 3 DAYS Performed at [facility], [place] [address] [place], [place] [address]    Report Status PENDING  Incomplete  Resp Panel by RT-PCR (Flu A&B, Covid) Nasopharyngeal Swab     Status: None   Collection Time: 01/05/21  8:57 PM   Specimen: Nasopharyngeal Swab; Nasopharyngeal(NP) swabs in vial transport medium  Result Value Ref Range Status   SARS Coronavirus 2 by RT PCR NEGATIVE NEGATIVE Final    Comment: (NOTE) SARS-CoV-2 target nucleic acids are NOT DETECTED.  The SARS-CoV-2 RNA is generally detectable in upper respiratory specimens during the acute phase of infection. The lowest concentration of SARS-CoV-2 viral copies this assay can detect is 138 copies/mL. A negative result does not preclude SARS-Cov-2 infection and should not be used as the sole basis for treatment or other patient management decisions. A negative result may occur with  improper specimen collection/handling, submission of specimen other than nasopharyngeal swab, presence of viral mutation(s) within the areas targeted by this assay, and inadequate number of viral copies(<138 copies/mL). A negative result must be combined with clinical observations, patient history, and epidemiological information. The expected result is Negative.  Fact Sheet for Patients:  EntrepreneurPulse.com.au  Fact Sheet for Healthcare Providers:  IncredibleEmployment.be  This test is no t yet approved or cleared by the [place] FDA and  has been authorized for detection and/or diagnosis of SARS-CoV-2 by FDA under an Emergency Use Authorization (EUA). This EUA will remain  in effect (meaning this test can be used) for the duration of the COVID-19 declaration under Section 564(b)(1) of  the Act, 21 U.S.C.section 360bbb-3(b)(1), unless the authorization is terminated  or revoked sooner.  Influenza A by PCR NEGATIVE NEGATIVE Final   Influenza B by PCR NEGATIVE NEGATIVE Final    Comment: (NOTE) The Xpert Xpress SARS-CoV-2/FLU/RSV plus assay is intended as an aid in the diagnosis of influenza from Nasopharyngeal swab specimens and should not be used as a sole basis for treatment. Nasal washings and aspirates are unacceptable for Xpert Xpress SARS-CoV-2/FLU/RSV testing.  Fact Sheet for Patients: EntrepreneurPulse.com.au  Fact Sheet for Healthcare Providers: IncredibleEmployment.be  This test is not yet approved or cleared by the [place] FDA and has been authorized for detection and/or diagnosis of SARS-CoV-2 by FDA under an Emergency Use Authorization (EUA). This EUA will remain in effect (meaning this test can be used) for the duration of the COVID-19 declaration under Section 564(b)(1) of the Act, 21 U.S.C. section 360bbb-3(b)(1), unless the authorization is terminated or revoked.  Performed at [facility], [place] [address] [place], [place] [address]   Respiratory (~20 pathogens) panel by PCR     Status: None   Collection Time: 01/05/21 11:26 PM  Result Value Ref Range Status   Adenovirus NOT DETECTED NOT DETECTED Final   Coronavirus 229E NOT DETECTED NOT DETECTED Final    Comment: (NOTE) The Coronavirus on the Respiratory Panel, DOES NOT test for the novel  Coronavirus (2019 nCoV)    Coronavirus HKU1 NOT DETECTED NOT DETECTED Final   Coronavirus NL63 NOT DETECTED NOT DETECTED Final   Coronavirus OC43 NOT DETECTED NOT DETECTED Final   Metapneumovirus NOT DETECTED NOT DETECTED Final   Rhinovirus / Enterovirus NOT DETECTED NOT DETECTED Final   Influenza A NOT DETECTED NOT DETECTED Final   Influenza B NOT DETECTED NOT DETECTED Final   Parainfluenza Virus 1 NOT DETECTED NOT DETECTED Final   Parainfluenza  Virus 2 NOT DETECTED NOT DETECTED Final   Parainfluenza Virus 3 NOT DETECTED NOT DETECTED Final   Parainfluenza Virus 4 NOT DETECTED NOT DETECTED Final   Respiratory Syncytial Virus NOT DETECTED NOT DETECTED Final   Bordetella pertussis NOT DETECTED NOT DETECTED Final   Bordetella Parapertussis NOT DETECTED NOT DETECTED Final   Chlamydophila pneumoniae NOT DETECTED NOT DETECTED Final   Mycoplasma pneumoniae NOT DETECTED NOT DETECTED Final    Comment: Performed at [facility], [place]. [address] [place], [place] [address]  Culture, blood (routine x 2)     Status: None (Preliminary result)   Collection Time: 01/07/21  5:27 AM   Specimen: BLOOD  Result Value Ref Range Status   Specimen Description BLOOD RIGHT ANTECUBITAL  Final   Special Requests   Final    BOTTLES DRAWN AEROBIC AND ANAEROBIC Blood Culture adequate volume   Culture   Final    NO GROWTH 1 DAY Performed at [facility], [place] [address], [place], [place] [address]    Report Status PENDING  Incomplete  Culture, blood (routine x 2)     Status: None (Preliminary result)   Collection Time: 01/07/21  5:27 AM   Specimen: BLOOD RIGHT HAND  Result Value Ref Range Status   Specimen Description BLOOD RIGHT HAND  Final   Special Requests   Final    BOTTLES DRAWN AEROBIC AND ANAEROBIC Blood Culture adequate volume   Culture   Final    NO GROWTH 1 DAY Performed at [facility], [place] [address] [place] [address] [place], [place] [address]    Report Status PENDING  Incomplete    Gaylan Gerold, [facility] for Infectious Disease [place] [facility] [phone] pager  [phone] cell 01/08/2021, 10:41 AM

## 2021-01-08 NOTE — Plan of Care (Signed)
?  Problem: Activity: ?Goal: Capacity to carry out activities will improve ?Outcome: Progressing ?  ?

## 2021-01-09 DIAGNOSIS — N179 Acute kidney failure, unspecified: Secondary | ICD-10-CM

## 2021-01-09 DIAGNOSIS — Z794 Long term (current) use of insulin: Secondary | ICD-10-CM

## 2021-01-09 DIAGNOSIS — E1122 Type 2 diabetes mellitus with diabetic chronic kidney disease: Secondary | ICD-10-CM

## 2021-01-09 DIAGNOSIS — Z7189 Other specified counseling: Secondary | ICD-10-CM

## 2021-01-09 DIAGNOSIS — I5033 Acute on chronic diastolic (congestive) heart failure: Secondary | ICD-10-CM

## 2021-01-09 DIAGNOSIS — Z952 Presence of prosthetic heart valve: Secondary | ICD-10-CM

## 2021-01-09 DIAGNOSIS — G9341 Metabolic encephalopathy: Secondary | ICD-10-CM

## 2021-01-09 DIAGNOSIS — N184 Chronic kidney disease, stage 4 (severe): Secondary | ICD-10-CM

## 2021-01-09 DIAGNOSIS — N19 Unspecified kidney failure: Secondary | ICD-10-CM

## 2021-01-09 LAB — CBC WITH DIFFERENTIAL/PLATELET
Abs Immature Granulocytes: 0.04 10*3/uL (ref 0.00–0.07)
Basophils Absolute: 0 10*3/uL (ref 0.0–0.1)
Basophils Relative: 0 %
Eosinophils Absolute: 0.1 10*3/uL (ref 0.0–0.5)
Eosinophils Relative: 4 %
HCT: 21.7 % — ABNORMAL LOW (ref 39.0–52.0)
Hemoglobin: 6.9 g/dL — CL (ref 13.0–17.0)
Immature Granulocytes: 4 %
Lymphocytes Relative: 24 %
Lymphs Abs: 0.3 10*3/uL — ABNORMAL LOW (ref 0.7–4.0)
MCH: 26.4 pg (ref 26.0–34.0)
MCHC: 31.8 g/dL (ref 30.0–36.0)
MCV: 83.1 fL (ref 80.0–100.0)
Monocytes Absolute: 0.2 10*3/uL (ref 0.1–1.0)
Monocytes Relative: 13 %
Neutro Abs: 0.6 10*3/uL — ABNORMAL LOW (ref 1.7–7.7)
Neutrophils Relative %: 55 %
Platelets: 90 10*3/uL — ABNORMAL LOW (ref 150–400)
RBC: 2.61 MIL/uL — ABNORMAL LOW (ref 4.22–5.81)
RDW: 17.1 % — ABNORMAL HIGH (ref 11.5–15.5)
WBC: 1.2 10*3/uL — CL (ref 4.0–10.5)
nRBC: 0 % (ref 0.0–0.2)

## 2021-01-09 LAB — RENAL FUNCTION PANEL
Albumin: 2.9 g/dL — ABNORMAL LOW (ref 3.5–5.0)
Albumin: 2.8 g/dL — ABNORMAL LOW (ref 3.5–5.0)
Anion gap: 14 (ref 5–15)
Anion gap: 11 (ref 5–15)
BUN: 85 mg/dL — ABNORMAL HIGH (ref 8–23)
BUN: 83 mg/dL — ABNORMAL HIGH (ref 8–23)
CO2: 21 mmol/L — ABNORMAL LOW (ref 22–32)
CO2: 22 mmol/L (ref 22–32)
Calcium: 8.5 mg/dL — ABNORMAL LOW (ref 8.9–10.3)
Calcium: 8.2 mg/dL — ABNORMAL LOW (ref 8.9–10.3)
Chloride: 103 mmol/L (ref 98–111)
Chloride: 105 mmol/L (ref 98–111)
Creatinine, Ser: 5.8 mg/dL — ABNORMAL HIGH (ref 0.61–1.24)
Creatinine, Ser: 5.62 mg/dL — ABNORMAL HIGH (ref 0.61–1.24)
GFR, Estimated: 10 mL/min — ABNORMAL LOW (ref >60)
GFR, Estimated: 11 mL/min — ABNORMAL LOW (ref >60)
Glucose, Bld: 88 mg/dL (ref 70–99)
Glucose, Bld: 99 mg/dL (ref 70–99)
Phosphorus: 6.1 mg/dL — ABNORMAL HIGH (ref 2.5–4.6)
Phosphorus: 5.3 mg/dL — ABNORMAL HIGH (ref 2.5–4.6)
Potassium: 4.8 mmol/L (ref 3.5–5.1)
Potassium: 5 mmol/L (ref 3.5–5.1)
Sodium: 138 mmol/L (ref 135–145)
Sodium: 138 mmol/L (ref 135–145)

## 2021-01-09 LAB — CBC
HCT: 25 % — ABNORMAL LOW (ref 39.0–52.0)
Hemoglobin: 7.7 g/dL — ABNORMAL LOW (ref 13.0–17.0)
MCH: 26.6 pg (ref 26.0–34.0)
MCHC: 30.8 g/dL (ref 30.0–36.0)
MCV: 86.2 fL (ref 80.0–100.0)
Platelets: 100 10*3/uL — ABNORMAL LOW (ref 150–400)
RBC: 2.9 MIL/uL — ABNORMAL LOW (ref 4.22–5.81)
RDW: 16.6 % — ABNORMAL HIGH (ref 11.5–15.5)
WBC: 1.2 10*3/uL — CL (ref 4.0–10.5)
nRBC: 0 % (ref 0.0–0.2)

## 2021-01-09 LAB — GLUCOSE, CAPILLARY
Glucose-Capillary: 54 mg/dL — ABNORMAL LOW (ref 70–99)
Glucose-Capillary: 63 mg/dL — ABNORMAL LOW (ref 70–99)
Glucose-Capillary: 55 mg/dL — ABNORMAL LOW (ref 70–99)
Glucose-Capillary: 57 mg/dL — ABNORMAL LOW (ref 70–99)
Glucose-Capillary: 65 mg/dL — ABNORMAL LOW (ref 70–99)
Glucose-Capillary: 68 mg/dL — ABNORMAL LOW (ref 70–99)
Glucose-Capillary: 96 mg/dL (ref 70–99)
Glucose-Capillary: 104 mg/dL — ABNORMAL HIGH (ref 70–99)
Glucose-Capillary: 98 mg/dL (ref 70–99)
Glucose-Capillary: 112 mg/dL — ABNORMAL HIGH (ref 70–99)
Glucose-Capillary: 97 mg/dL (ref 70–99)
Glucose-Capillary: 83 mg/dL (ref 70–99)

## 2021-01-09 LAB — MRSA NEXT GEN BY PCR, NASAL: MRSA by PCR Next Gen: NOT DETECTED

## 2021-01-09 LAB — PREPARE RBC (CROSSMATCH)

## 2021-01-09 LAB — HEMOGLOBIN AND HEMATOCRIT, BLOOD
HCT: 25.4 % — ABNORMAL LOW (ref 39.0–52.0)
Hemoglobin: 8.2 g/dL — ABNORMAL LOW (ref 13.0–17.0)

## 2021-01-09 MED ORDER — SODIUM CHLORIDE 0.9 % IV SOLN: 100.0000 mL | INTRAVENOUS | Status: DC | PRN

## 2021-01-09 MED ORDER — ALTEPLASE 2 MG IJ SOLR: 2.0000 mg | Freq: Once | INTRAMUSCULAR | Status: DC | PRN

## 2021-01-09 MED ORDER — GLUCOSE 40 % PO GEL
1.0000 | ORAL | Status: AC
  Administered 2021-01-09: 31 g via ORAL

## 2021-01-09 MED ORDER — LIDOCAINE HCL (PF) 1 % IJ SOLN: 5.0000 mL | INTRAMUSCULAR | Status: DC | PRN

## 2021-01-09 MED ORDER — PENTAFLUOROPROP-TETRAFLUOROETH EX AERO: 1.0000 "application " | INHALATION_SPRAY | CUTANEOUS | Status: DC | PRN

## 2021-01-09 MED ORDER — LIDOCAINE-PRILOCAINE 2.5-2.5 % EX CREA
1.0000 "application " | TOPICAL_CREAM | CUTANEOUS | Status: DC | PRN
  Filled 2021-01-09: qty 5

## 2021-01-09 MED ORDER — PRISMASOL BGK 4/2.5 32-4-2.5 MEQ/L REPLACEMENT SOLN
Status: DC
  Filled 2021-01-09: qty 5000

## 2021-01-09 MED ORDER — SODIUM CHLORIDE 0.9% IV SOLUTION: Freq: Once | INTRAVENOUS | Status: AC

## 2021-01-09 MED ORDER — PRISMASOL BGK 4/2.5 32-4-2.5 MEQ/L EC SOLN
Status: DC
  Filled 2021-01-09 (×3): qty 5000

## 2021-01-09 MED ORDER — HEPARIN SODIUM (PORCINE) 1000 UNIT/ML DIALYSIS
1000.0000 [IU] | INTRAMUSCULAR | Status: DC | PRN
  Administered 2021-01-13: 3000 [IU] via INTRAVENOUS_CENTRAL
  Filled 2021-01-09 (×2): qty 6

## 2021-01-09 MED ORDER — HEPARIN SODIUM (PORCINE) 1000 UNIT/ML DIALYSIS
1000.0000 [IU] | INTRAMUSCULAR | Status: DC | PRN
  Filled 2021-01-09: qty 1

## 2021-01-09 MED ORDER — GLUCOSE 40 % PO GEL
ORAL | Status: AC
  Filled 2021-01-09: qty 1

## 2021-01-09 MED ORDER — DEXTROSE 50 % IV SOLN
12.5000 g | INTRAVENOUS | Status: AC
  Administered 2021-01-09: 12.5 g via INTRAVENOUS
  Filled 2021-01-09: qty 50

## 2021-01-09 MED ORDER — SODIUM CHLORIDE 0.9 % FOR CRRT: INTRAVENOUS_CENTRAL | Status: DC | PRN

## 2021-01-09 NOTE — Consult Note (Signed)
NAME:  Steven [name], MRN:  [id number], DOB:  March 24, 1956, LOS: 4 ADMISSION DATE:  01/05/2021, CONSULTATION DATE:  01/09/2021 REFERRING MD:  Oswald Hillock, MD, CHIEF COMPLAINT:  Uremia, renal failure  History of Present Illness:  Mr. Steven [name] is a 65 year old gentleman with a history of type 2 diabetes, chronic heart failure preserved ejection fraction, peripheral vascular disease status post left BKA, CKD stage IV of transplanted kidney, aortic stenosis status post TAVR in February 2022 who presents with worsening shortness of breath on 01/09/2021.  Despite successful TAVR in February 2022 he has had worsening shortness of breath and a CT chest showed bilateral pleural effusions and ascites.  He was referred to cardiothoracic surgery as an outpatient for bilateral Pleurx catheter placement in the beginning of June.  Has had thoracentesis large-volume and now large volume drainage from his Pleurx catheters.  He presented to the emergency department on June 7 for worsening shortness of breath.  He was found to have Enterococcus faecalis bacteremia.  His renal failure has progressively worsened and he is now exhibiting signs of uremia with encephalopathy.  They are called to see the patient by Dr. Marval Regal and Dr. Darrick Meigs for transfer to the ICU for continuous renal replacement therapy in the setting of encephalopathy and uremia.  This morning he is oriented to self but not to situation or time.  His wife is at the bedside and notes that he is very confused when she is not with him.  Currently denies any complaints other than some dyspnea.  He had anemia but hemoglobin of 6.9 this morning and was transfused 1 unit PRBC.  Pertinent  Medical History  Kidney transplant recipient, now with CKD stage IV Type 2 diabetes Severe aortic stenosis status post TAVR Peripheral vascular disease status post left BKA  Significant Hospital Events: Including procedures, antibiotic start and stop dates in addition to other  pertinent events   6/3Pleurx catheters placed 6/7 presented to [facility], ED worsening shortness of breath 6/8 blood cultures positive for Enterococcus faecalis 6/10 temporary HD catheter placed 6/11 worsening anasarca an encephalopathy, PCCM consulted  Interim History / Subjective:    Objective   Blood pressure 117/61, pulse 79, temperature 97.8 F (36.6 C), temperature source Oral, resp. rate 18, height '5\' 11"'$  (1.803 m), weight 119.8 kg, SpO2 100 %.        Intake/Output Summary (Last 24 hours) at 01/09/2021 1140 Last data filed at 01/09/2021 1051 Gross per 24 hour  Intake 1799.96 ml  Output 1275 ml  Net 524.96 ml   Filed Weights   01/07/21 0000 01/08/21 0514 01/09/21 0500  Weight: 117.3 kg 118.2 kg 119.8 kg    Examination: General: diffuse anasarca HENT: mmm Lungs: diminished bilaterally, bilateral pleurx catheters placed Cardiovascular: RRR, systolic murmur, diminished heart sounds, no rub Abdomen: obese, soft, distended, +fluid wave Extremities: left BKA, thin upper extremities Neuro: moves all 4 extremities, awake, follows commands, not oriented to time or situation GU: catheter in place Lines: Right internal jugular temporary hemodialysis catheter in place  Labs/imaging that I havepersonally reviewed  (right click and "Reselect all SmartList Selections" daily)  Blood cx 6/7 growing e. Faecalis Cr 5.8 BUN 85 K 4.8 Phos 6.1 WBC 1.2 Hgb 6.9 CT chest on 6/7 shows bilateral pleural effusions with bilateral Pleurx catheters placed, ascites, diffuse body wall edema, compressive atelectasis   Resolved Hospital Problem list     Assessment & Plan:  Steven [name] with CKD stage IV on the transplanted kidney, type  2 diabetes with peripheral vascular disease and left BKA, aortic stenosis status post TAVR who presents with  Acute metabolic encephalopathy, secondary to uremia Stage IV CKD of transplanted kidney, now requiring renal placement therapy Plan is for  transfer to the ICU with initiation of CRRT I would suggest a time-limited trial of CRRT as the patient has described in the past that he would not want to be on indefinite hemodialysis.  Perhaps this will clear his uremia to the extent that he is able to be more alert, oriented and able to participate in decision-making regarding his health Continue Prograf  Enterococcus faecalis bacteremia Is on ampicillin and ceftriaxone, ID is following Plan is for TEE next week  Type II diabetes mellitus Currently on Lantus and insulin, he has been hypoglycemic due to poor oral intake, will closely monitor CBGs  Hypertension Continue home medications, threshold pause due to CRRT if he gets hypotensive  Acute on chronic anemia Likely secondary to worsening renal failure in setting of chronic disease Transfuse to keep goal hemoglobin over 7  Best practice (right click and "Reselect all SmartList Selections" daily)  Diet:  NPO Pain/Anxiety/Delirium protocol (if indicated): No VAP protocol (if indicated): Not indicated DVT prophylaxis: Subcutaneous Heparin GI prophylaxis: N/A Glucose control:  SSI Yes Central venous access:  Yes, and it is still needed Arterial line:  N/A Foley:  N/A Mobility:  bed rest  PT consulted: N/A Last date of multidisciplinary goals of care discussion [6/11 I talked with the patient's wife at the bedside today.  She reports that in the past he has never wanted to be on hemodialysis.  The patient and his wife have 4 children and the patient's parents are still living.  Wife says they are currently separated but still very involved in each other's lives.  In the past he has also said that he would not want to be resuscitated and apparently was going to fill out a DNR form.  She says that she is going to talk to his parents and her children.] Code Status:  full code Disposition: ICU  Labs   CBC: Recent Labs  Lab 01/05/21 1550 01/07/21 0527 01/07/21 1601 01/08/21 0311  01/09/21 0311  WBC 4.9 1.6*  --  1.3* 1.2*  NEUTROABS 4.0  --   --   --  0.6*  HGB 8.7* 6.4* 7.3* 7.1* 6.9*  HCT 26.7* 20.3* 22.9* 22.3* 21.7*  MCV 80.9 82.9  --  84.5 83.1  PLT 105* 98*  --  90* 90*    Basic Metabolic Panel: Recent Labs  Lab 01/05/21 1550 01/06/21 0240 01/07/21 0527 01/08/21 0311 01/09/21 0311  NA 130* 132* 137 138 138  K 4.2 4.2 4.6 4.7 4.8  CL 102 102 102 105 103  CO2 19* 16* 21* 17* 21*  GLUCOSE 193* 175* 143* 107* 88  BUN 71* 77* 80* 82* 85*  CREATININE 4.71* 4.72* 5.32* 5.34* 5.80*  CALCIUM 7.3* 7.7* 8.2* 8.2* 8.5*  PHOS  --   --  5.4* 5.8* 6.1*   GFR: Estimated Creatinine Clearance: 17.2 mL/min (A) (by C-G formula based on SCr of 5.8 mg/dL (H)). Recent Labs  Lab 01/05/21 1550 01/05/21 1636 01/05/21 1950 01/07/21 0527 01/08/21 0311 01/09/21 0311  WBC 4.9  --   --  1.6* 1.3* 1.2*  LATICACIDVEN  --  1.0 0.8  --   --   --     Liver Function Tests: Recent Labs  Lab 01/05/21 1550 01/07/21 0527 01/08/21 0311 01/09/21 [id number]  AST 18 21  --   --   ALT 12 15  --   --   ALKPHOS 129* 92  --   --   BILITOT 0.5 0.6  --   --   PROT 4.6* 4.9*  --   --   ALBUMIN 1.8* 2.5* 2.8* 2.9*   No results for input(s): LIPASE, AMYLASE in the last 168 hours. No results for input(s): AMMONIA in the last 168 hours.  ABG    Component Value Date/Time   PHART 7.461 (H) 09/04/2020 1442   PCO2ART 32.4 09/04/2020 1442   PO2ART 111 (H) 09/04/2020 1442   HCO3 22.8 09/04/2020 1442   TCO2 22 09/08/2020 1022   ACIDBASEDEF 0.6 09/04/2020 1442   O2SAT 98.7 09/04/2020 1442     Coagulation Profile: Recent Labs  Lab 01/05/21 1550  INR 1.2    Cardiac Enzymes: No results for input(s): CKTOTAL, CKMB, CKMBINDEX, TROPONINI in the last 168 hours.  HbA1C: Hemoglobin-A1c  Date/Time Value Ref Range Status  07/11/2007 03:04 PM  <6.0 % Final   5.9 (NOTE)  Therapeutic target for the treatment of diabetes mellitus patients is <7% HbA1c.  American Diabetes Association  Diabetes Care 2002;25:S33-S49.   Hgb A1c MFr Bld  Date/Time Value Ref Range Status  12/31/2020 08:47 AM 5.2 4.8 - 5.6 % Final    Comment:    (NOTE)         Prediabetes: 5.7 - 6.4         Diabetes: >6.4         Glycemic control for adults with diabetes: <7.0   09/04/2020 02:11 PM 6.8 (H) 4.8 - 5.6 % Final    Comment:    (NOTE) Pre diabetes:          5.7%-6.4%  Diabetes:              >6.4%  Glycemic control for   <7.0% adults with diabetes     CBG: Recent Labs  Lab 01/09/21 0739 01/09/21 0805 01/09/21 0832 01/09/21 0921 01/09/21 1003  GLUCAP 55* 65* 96 68* 104*    Review of Systems:   Unable to obtain due to mental status  Past Medical History:  He,  has a past medical history of Bursitis, Cataract, Constipation due to opioid therapy, Diabetes ([place]), 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), GERD (gastroesophageal reflux disease), HLD (hyperlipidemia), Hyperparathyroidism ([place]), Hypertension, Kidney transplant recipient, Obesity, Pneumonia, S/P TAVR (transcatheter aortic valve replacement) (09/08/2020), Sleep apnea, Venous insufficiency, and Vitamin D deficiency.   Surgical History:   Past Surgical History:  Procedure Laterality Date   AMPUTATION Right 07/19/2017   Procedure: AMPUTATION RIGHT 2ND TOE;  Surgeon: Leandrew Koyanagi, MD;  Location: [facility];  Service: Orthopedics;  Laterality: Right;   AMPUTATION Left 06/03/2020   Procedure: LEFT BELOW KNEE AMPUTATION;  Surgeon: Newt Minion, MD;  Location: [facility];  Service: Orthopedics;  Laterality: Left;   CHEST TUBE INSERTION Bilateral 01/01/2021   Procedure: INSERTION PLEURAL DRAINAGE CATHETER;  Surgeon: Gaye Pollack, MD;  Location: [facility];  Service: Thoracic;  Laterality: Bilateral;   COLONOSCOPY     greater than 10 yrs ago   EYE SURGERY Bilateral    cataracts removed   INTRAOPERATIVE TRANSTHORACIC ECHOCARDIOGRAM N/A 09/08/2020   Procedure: INTRAOPERATIVE TRANSTHORACIC ECHOCARDIOGRAM;  Surgeon: Sherren Mocha, MD;  Location: [facility];  Service: Open Heart Surgery;  Laterality: N/A;   IR FLUORO GUIDE CV LINE RIGHT  01/08/2021   IR THORACENTESIS ASP PLEURAL SPACE W/IMG GUIDE  10/29/2020  IR THORACENTESIS ASP PLEURAL SPACE W/IMG GUIDE  12/11/2020   KIDNEY TRANSPLANT  2006   NEPHRECTOMY TRANSPLANTED ORGAN  2006   RIGHT HEART CATH AND CORONARY ANGIOGRAPHY N/A 06/08/2020   Procedure: RIGHT HEART CATH AND CORONARY ANGIOGRAPHY;  Surgeon: Nelva Bush, MD;  Location: [place] [facility];  Service: Cardiovascular;  Laterality: N/A;   TOOTH EXTRACTION N/A 08/06/2020   Procedure: DENTAL EXTRACTIONS;  Surgeon: [name], DMD;  Location: [facility];  Service: Dentistry;  Laterality: N/A;   TRANSCATHETER AORTIC VALVE REPLACEMENT, TRANSFEMORAL Left 09/08/2020   Procedure: TRANSCATHETER AORTIC VALVE REPLACEMENT, LEFT TRANSFEMORAL;  Surgeon: Sherren Mocha, MD;  Location: [facility];  Service: Open Heart Surgery;  Laterality: Left;   UPPER GASTROINTESTINAL ENDOSCOPY     WISDOM TOOTH EXTRACTION       Social History:   reports that he has never smoked. He quit smokeless tobacco use about 2 years ago.  His smokeless tobacco use included chew. He reports that he does not drink alcohol and does not use drugs.   Family History:  His family history includes Healthy in his father and mother. There is no history of Colon cancer, Rectal cancer, or Stomach cancer.   Allergies Allergies  Allergen Reactions   Sulfa Antibiotics Rash     Home Medications  Prior to Admission medications   Medication Sig Start Date End Date Taking? Authorizing Provider  acetaminophen (TYLENOL) 500 MG tablet Take 1,000 mg by mouth every 6 (six) hours as needed for moderate pain.   Yes [provider]  aspirin EC 81 MG EC tablet Take 1 tablet (81 mg total) by mouth daily. Swallow whole. 06/24/20  Yes Love, Ivan Anchors, PA-C  atorvastatin (LIPITOR) 10 MG tablet Take 1 tablet (10 mg total) by mouth daily. 06/10/20  Yes Oretha Milch D, MD  clopidogrel (PLAVIX)  75 MG tablet TAKE 1 TABLET (75 MG TOTAL) BY MOUTH DAILY WITH BREAKFAST. Patient taking differently: Take 75 mg by mouth at bedtime. 09/10/20 09/10/21 Yes Eileen Stanford, PA-C  cyclobenzaprine (FLEXERIL) 5 MG tablet Take 1 tablet (5 mg total) by mouth 3 (three) times daily as needed for muscle spasms. 06/23/20  Yes Love, Ivan Anchors, PA-C  docusate sodium (COLACE) 100 MG capsule Take 1 capsule (100 mg total) by mouth 2 (two) times daily. Patient taking differently: Take 100 mg by mouth daily. 06/23/20  Yes Love, Ivan Anchors, PA-C  fenofibrate (TRICOR) 48 MG tablet Take 1 tablet (48 mg total) by mouth daily. 06/10/20  Yes Oretha Milch D, MD  furosemide (LASIX) 20 MG tablet Take 1 tablet (20 mg total) by mouth daily. 10/16/20 10/11/21 Yes Eileen Stanford, PA-C  glyBURIDE (DIABETA) 5 MG tablet Take 1 tablet (5 mg total) by mouth 2 (two) times daily with a meal. 06/10/20  Yes Oretha Milch D, MD  hydrALAZINE (APRESOLINE) 50 MG tablet Take 1 tablet (50 mg total) by mouth 3 (three) times daily. 10/05/20  Yes Theora Gianotti, NP  insulin aspart (NOVOLOG) 100 UNIT/ML injection Inject 0-30 Units into the skin 3 (three) times daily as needed for high blood sugar. Sliding scale 06/10/20  Yes Oretha Milch D, MD  insulin detemir (LEVEMIR) 100 UNIT/ML FlexPen Inject 10 Units into the skin daily. 06/10/20  Yes Oretha Milch D, MD  mycophenolate (MYFORTIC) 360 MG TBEC EC tablet Take 1 tablet (360 mg total) by mouth 2 (two) times daily. 06/10/20  Yes Oretha Milch D, MD  pantoprazole (PROTONIX) 40 MG tablet TAKE 1 TABLET (40 MG TOTAL)  BY MOUTH DAILY. Patient taking differently: Take 40 mg by mouth daily. 09/10/20 09/10/21 Yes Eileen Stanford, PA-C  polyethylene glycol (MIRALAX / GLYCOLAX) 17 g packet Take 17 g by mouth daily as needed for mild constipation. 06/10/20  Yes Oretha Milch D, MD  tacrolimus (PROGRAF) 1 MG capsule Take 1 capsule (1 mg total) by mouth 2 (two) times daily. 06/10/20  Yes  Oretha Milch D, MD  traMADol (ULTRAM) 50 MG tablet Take 1 tablet (50 mg total) by mouth every 12 (twelve) hours as needed for moderate pain. 07/29/20  Yes Persons, Bevely Palmer, PA  traZODone (DESYREL) 50 MG tablet Take 1 tablet (50 mg total) by mouth at bedtime. Patient taking differently: Take 50 mg by mouth at bedtime as needed for sleep. 06/23/20  Yes Love, Ivan Anchors, PA-C  acetaminophen-codeine (TYLENOL #3) 300-30 MG tablet Take 1 tablet by mouth every 8 (eight) hours as needed for moderate pain. Patient not taking: Reported on 12/30/2020 07/29/20   Persons, Bevely Palmer, [place]  Insulin Pen Needle (BD PEN NEEDLE NANO 2ND GEN) 32G X 4 MM MISC Use daily for glucose control 06/10/20   Oretha Milch D, MD  Insulin Pen Needle 32G X 4 MM MISC Use as directed with insulin pen 06/10/20   Desiree Hane, MD  [facility] VERIO test strip 1 each by Other route 3 (three) times daily. 06/10/20   Desiree Hane, MD     Critical care time: 68     The patient is critically ill due to uremia, encephalopathy, AKI requiring RRT.  Critical care was necessary to treat or prevent imminent or life-threatening deterioration.  Critical care was time spent personally by me on the following activities: development of treatment plan with patient and/or surrogate as well as nursing, discussions with consultants, evaluation of patient's response to treatment, examination of patient, obtaining history from patient or surrogate, ordering and performing treatments and interventions, ordering and review of laboratory studies, ordering and review of radiographic studies, pulse oximetry, re-evaluation of patient's condition and participation in multidisciplinary rounds.   Critical Care Time devoted to patient care services described in this note is 40 minutes. This time reflects time of care of this [place] . This critical care time does not reflect separately billable procedures or procedure time, teaching time or  supervisory time of PA/NP/Med student/Med Resident etc but could involve care discussion time.       Spero Geralds [place] [facility] 01/09/2021 11:41 AM  Pager: see AMION  If no response to pager , please call critical care on call (see AMION) until 7pm After 7:00 pm call [place]

## 2021-01-09 NOTE — Progress Notes (Signed)
Patient ID: Steven [name], male   DOB: 09-13-1955, 65 y.o.   MRN: [id number] S: S/p temp HD catheter placed yesterday evening and called for HD first thing this morning but was hypoglycemic and has had AMS.  Hgb also dropped and given blood transfusion.  Wife at bedside and she is very concerned because of his confusion. O:BP 117/61   Pulse 79   Temp 97.8 F (36.6 C) (Oral)   Resp 18   Ht '5\' 11"'$  (1.803 m)   Wt 119.8 kg   SpO2 100%   BMI 36.84 kg/m   Intake/Output Summary (Last 24 hours) at 01/09/2021 1159 Last data filed at 01/09/2021 1051 Gross per 24 hour  Intake 1799.96 ml  Output 1275 ml  Net 524.96 ml   Intake/Output: I/O last 3 completed shifts: In: 2020.1 [P.O.:920; I.V.:15.2; IV Piggyback:1084.9] Out: [id number] [Urine:275; Chest Tube:1000]  Intake/Output this shift:  Total I/O In: 486.3 [P.O.:120; I.V.:3; Blood:363.3] Out: -  Weight change: 1.6 kg [id number], confused, appears acutely ill [id number] Resp:decreased BS at bases, pleurx in place b/l [id number], +BS, soft Ext: 2+ anasarca to lower abdomend, s/p LBKA  Recent Labs  Lab 01/05/21 1550 01/06/21 0240 01/07/21 0527 01/08/21 0311 01/09/21 0311  NA 130* 132* 137 138 138  K 4.2 4.2 4.6 4.7 4.8  CL 102 102 102 105 103  CO2 19* 16* 21* 17* 21*  GLUCOSE 193* 175* 143* 107* 88  BUN 71* 77* 80* 82* 85*  CREATININE 4.71* 4.72* 5.32* 5.34* 5.80*  ALBUMIN 1.8*  --  2.5* 2.8* 2.9*  CALCIUM 7.3* 7.7* 8.2* 8.2* 8.5*  PHOS  --   --  5.4* 5.8* 6.1*  AST 18  --  21  --   --   ALT 12  --  15  --   --    Liver Function Tests: Recent Labs  Lab 01/05/21 1550 01/07/21 0527 01/08/21 0311 01/09/21 0311  AST 18 21  --   --   ALT 12 15  --   --   ALKPHOS 129* 92  --   --   BILITOT 0.5 0.6  --   --   PROT 4.6* 4.9*  --   --   ALBUMIN 1.8* 2.5* 2.8* 2.9*   No results for input(s): LIPASE, AMYLASE in the last 168 hours. No results for input(s): AMMONIA in the last 168 hours. CBC: Recent Labs  Lab 01/05/21 1550  01/07/21 0527 01/07/21 1601 01/08/21 0311 01/09/21 0311  WBC 4.9 1.6*  --  1.3* 1.2*  NEUTROABS 4.0  --   --   --  0.6*  HGB 8.7* 6.4* 7.3* 7.1* 6.9*  HCT 26.7* 20.3* 22.9* 22.3* 21.7*  MCV 80.9 82.9  --  84.5 83.1  PLT 105* 98*  --  90* 90*   Cardiac Enzymes: No results for input(s): CKTOTAL, CKMB, CKMBINDEX, TROPONINI in the last 168 hours. CBG: Recent Labs  Lab 01/09/21 0739 01/09/21 0805 01/09/21 0832 01/09/21 0921 01/09/21 1003  GLUCAP 55* 65* 96 68* 104*    Iron Studies: No results for input(s): IRON, TIBC, TRANSFERRIN, FERRITIN in the last 72 hours. Studies/Results: IR Fluoro Guide CV Line Right  Result Date: 01/08/2021 INDICATION: 65 year old male in need hemodialysis. Placement of a temporary non tunneled hemodialysis catheter requested. EXAM: IR RIGHT FLUORO GUIDE CV LINE MEDICATIONS: None ANESTHESIA/SEDATION: None FLUOROSCOPY TIME:  Fluoroscopy Time: 0 minutes 24 seconds (14 mGy). COMPLICATIONS: None immediate. PROCEDURE: Informed written consent was obtained from the patient after a thorough discussion of the  procedural risks, benefits and alternatives. All questions were addressed. Maximal Sterile Barrier Technique was utilized including caps, mask, sterile gowns, sterile gloves, sterile drape, hand hygiene and skin antiseptic. A timeout was performed prior to the initiation of the procedure. The right internal jugular vein was interrogated with ultrasound and found to be widely patent. An image was obtained and stored for the medical record. Local anesthesia was attained by infiltration with 1% lidocaine. A small dermatotomy was made. Under real-time sonographic guidance, the vessel was punctured with a 21 gauge micropuncture needle. Using standard technique, the initial micro needle was exchanged over a 0.018 micro wire for a transitional 4 [place] micro sheath. The micro sheath was then exchanged over a 0.035 wire for a fascial dilator and the soft tissue tract was  dilated. A 20 cm triple-lumen non tunneled hemodialysis catheter was then advanced over the wire and positioned with the tip in the mid right atrium. The catheter was flushed and locked with heparinized saline. Images were obtained and stored for the medical record. The catheter was capped and secured to the skin with 0 Prolene suture. IMPRESSION: Successful placement of right IJ 20 cm non tunneled triple-lumen hemodialysis catheter. Electronically Signed   By: Jacqulynn Cadet M.D.   On: 01/08/2021 18:17   ECHOCARDIOGRAM COMPLETE  Result Date: 01/07/2021    ECHOCARDIOGRAM REPORT   Patient Name:   Steven [name] Date of Exam: 01/07/2021 Medical Rec #:  [id number]       Height:       71.0 in Accession #:    [id number]      Weight:       258.6 lb Date of Birth:  [birth year]       BSA:          2.352 m Patient Age:    35 years        BP:           125/64 mmHg Patient Gender: M               HR:           83 bpm. Exam Location:  Inpatient Procedure: 2D Echo, Cardiac Doppler and Color Doppler Indications:    bacteremia  History:        Patient has prior history of Echocardiogram examinations, most                 recent 10/09/2020. Aortic Valve Disease; Risk                 Factors:Hypertension and Dyslipidemia.  Sonographer:    Cammy Brochure Referring Phys: Earnie Larsson LAMA  Sonographer Comments: Patient is morbidly obese. Image acquisition challenging due to patient body habitus, Image acquisition challenging due to respiratory motion and bandages obscuring windows. IMPRESSIONS  1. Left ventricular ejection fraction, by estimation, is 60 to 65%. The left ventricle has normal function. The left ventricle has no regional wall motion abnormalities. There is moderate left ventricular hypertrophy. Left ventricular diastolic parameters are consistent with Grade II diastolic dysfunction (pseudonormalization).  2. Right ventricular systolic function is normal. The right ventricular size is mildly enlarged. Tricuspid  regurgitation signal is inadequate for assessing PA pressure.  3. Left atrial size was mildly dilated.  4. The mitral valve is abnormal with severe calcification of the valve and annulus. Cannot rule out endocarditis from this study. No evidence of mitral valve regurgitation. Moderate mitral stenosis. The mean mitral valve gradient is 6.0 mmHg, MVA 1.7 cm^2  by VTI.  5. Bioprosthetic aortic valve s/p TAVR. No significant peri-valvular leakage noted. Mean gradient 12 mmHg. No vegetation seen on the bioprosthetic aortic valve but cannot fully rule out endocarditis from this study.  6. The inferior vena cava is normal in size with greater than 50% respiratory variability, suggesting right atrial pressure of 3 mmHg.  7. Cannot rule out endocarditis from this study. FINDINGS  Left Ventricle: Left ventricular ejection fraction, by estimation, is 60 to 65%. The left ventricle has normal function. The left ventricle has no regional wall motion abnormalities. The left ventricular internal cavity size was normal in size. There is  moderate left ventricular hypertrophy. Left ventricular diastolic parameters are consistent with Grade II diastolic dysfunction (pseudonormalization). Right Ventricle: The right ventricular size is mildly enlarged. No increase in right ventricular wall thickness. Right ventricular systolic function is normal. Tricuspid regurgitation signal is inadequate for assessing PA pressure. Left Atrium: Left atrial size was mildly dilated. Right Atrium: Right atrial size was normal in size. Pericardium: Trivial pericardial effusion is present. Mitral Valve: The mitral valve is abnormal. There is severe thickening of the mitral valve leaflet(s). There is mild calcification of the mitral valve leaflet(s). No evidence of mitral valve regurgitation. Moderate mitral valve stenosis. MV peak gradient, 14.1 mmHg. The mean mitral valve gradient is 6.0 mmHg. Tricuspid Valve: The tricuspid valve is normal in structure.  Tricuspid valve regurgitation is not demonstrated. Aortic Valve: Bioprosthetic aortic valve s/p TAVR. No significant peri-valvular leakage noted. Mean gradient 12 mmHg. The aortic valve has been repaired/replaced. Aortic valve regurgitation is not visualized. Aortic valve mean gradient measures 12.0 mmHg. Aortic valve peak gradient measures 17.5 mmHg. Aortic valve area, by VTI measures 2.01 cm. There is a 26 mm Sapien prosthetic, stented (TAVR) valve present in the aortic position. Pulmonic Valve: The pulmonic valve was normal in structure. Pulmonic valve regurgitation is not visualized. Aorta: The aortic root is normal in size and structure. Venous: The inferior vena cava is normal in size with greater than 50% respiratory variability, suggesting right atrial pressure of 3 mmHg. IAS/Shunts: No atrial level shunt detected by color flow Doppler.  LEFT VENTRICLE PLAX 2D LVIDd:         5.60 cm  Diastology LVIDs:         3.50 cm  LV e' medial:    4.68 cm/s LV PW:         1.10 cm  LV E/e' medial:  39.3 LV IVS:        1.00 cm  LV e' lateral:   5.98 cm/s LVOT diam:     2.15 cm  LV E/e' lateral: 30.8 LV SV:         81 LV SV Index:   34 LVOT Area:     3.63 cm  RIGHT VENTRICLE             IVC RV Basal diam:  4.90 cm     IVC diam: 1.70 cm RV S prime:     10.90 cm/s TAPSE (M-mode): 1.9 cm LEFT ATRIUM         Index LA diam:    4.50 cm 1.91 cm/m  AORTIC VALVE AV Area (Vmax):    1.87 cm AV Area (Vmean):   1.78 cm AV Area (VTI):     2.01 cm AV Vmax:           209.33 cm/s AV Vmean:          147.333 cm/s AV VTI:  0.402 m AV Peak Grad:      17.5 mmHg AV Mean Grad:      12.0 mmHg LVOT Vmax:         108.00 cm/s LVOT Vmean:        72.300 cm/s LVOT VTI:          0.223 m LVOT/AV VTI ratio: 0.55  AORTA Ao Root diam: 2.70 cm MITRAL VALVE MV Area (PHT): 3.50 cm     SHUNTS MV Area VTI:   1.73 cm     Systemic VTI:  0.22 m MV Peak grad:  14.1 mmHg    Systemic Diam: 2.15 cm MV Mean grad:  6.0 mmHg MV Vmax:       1.88 m/s MV  Vmean:      108.0 cm/s MV Decel Time: 217 msec MV E velocity: 184.00 cm/s MV A velocity: 100.00 cm/s MV E/A ratio:  1.84 Loralie Champagne MD Electronically signed by Loralie Champagne MD Signature Date/Time: 01/07/2021/3:08:19 PM    Final     aspirin  81 mg Oral Daily   atorvastatin  10 mg Oral Daily   carvedilol  3.125 mg Oral BID WC   Chlorhexidine Gluconate Cloth  6 each Topical Q0600   clopidogrel  75 mg Oral Daily   dextrose       docusate sodium  100 mg Oral Daily   feeding supplement  237 mL Oral TID BM   fenofibrate  160 mg Oral Daily   insulin aspart  0-9 Units Subcutaneous TID WC   multivitamin with minerals  1 tablet Oral Daily   pantoprazole  40 mg Oral Daily   sodium bicarbonate  650 mg Oral BID   sodium chloride flush  3 mL Intravenous Q12H   tacrolimus  1 mg Oral BID    BMET    Component Value Date/Time   NA 138 01/09/2021 0311   NA 139 11/10/2020 1137   K 4.8 01/09/2021 0311   CL 103 01/09/2021 0311   CO2 21 (L) 01/09/2021 0311   GLUCOSE 88 01/09/2021 0311   BUN 85 (H) 01/09/2021 0311   BUN 31 (H) 11/10/2020 1137   CREATININE 5.80 (H) 01/09/2021 0311   CREATININE 1.99 (H) 07/09/2020 1155   CALCIUM 8.5 (L) 01/09/2021 0311   CALCIUM 7.6 (L) 07/12/2007 0400   GFRNONAA 10 (L) 01/09/2021 0311   GFRNONAA 61 10/03/2013 1432   GFRAA 53 (L) 09/16/2020 1404   GFRAA 71 10/03/2013 1432   CBC    Component Value Date/Time   WBC 1.2 (LL) 01/09/2021 0311   RBC 2.61 (L) 01/09/2021 0311   HGB 6.9 (LL) 01/09/2021 0311   HCT 21.7 (L) 01/09/2021 0311   PLT 90 (L) 01/09/2021 0311   MCV 83.1 01/09/2021 0311   MCV 85.9 10/03/2013 1354   MCH 26.4 01/09/2021 0311   MCHC 31.8 01/09/2021 0311   RDW 17.1 (H) 01/09/2021 0311   LYMPHSABS 0.3 (L) 01/09/2021 0311   MONOABS 0.2 01/09/2021 0311   EOSABS 0.1 01/09/2021 0311   BASOSABS 0.0 01/09/2021 0311      HPI: Steven [name] is an 65 y.o. 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  Followed by Dr. 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  [place] with some hydro to transplanted kidney.  Possibly cardiorenal syndrome (supported by low UNa), but also concerning for nephrotic syndrome.  Will obtain 24 hour urine protein.  May need transfer to [facility] for transplant kidney biopsy. Not responding to IV lasix 120 mg tid. Consulted IR for HD cath placed last night but unable to go to HD this morning because of hypoglycemia and AMS. Now with asterixis and AMS, given massive volume overload, soft BP's, and pancytopenia will plan on transfer to ICU for CRRT with UF.  Discussed this with his wife.   She reports that he has said he would not want long-term dialysis. Will UF as tolerated. Nephrotic range proteinuria, unclear if txp biopsy would be helpful but have ordered SPEP/UPEP. Acute hypoxic respiratory failure - secondary to recurrent pleural effusions. Acute on chronic CHF - ECHO in 3/22 showed preserved EF with grade II DD and mildly increased gradient across TAVR.   Not responding to high dose IV lasix. Repeat ECHO with EF 60-65%, mod LVH, Grade II DD, normal RV function 24 hour urine protein with 6 grams/24 hours but normal lipids. Will transfer to ICU for CRRT as above.   Immunosuppression - continue with home dose of prograf and myfortic Due to leukopenia, will decrease myfortic to 180 mg bid but may need to decrease further. Enterococcal Faecalis bacteremia - ID following and managing abx.  TTE pending to r/o SBE.   Pancytopenia - will stop myfortic.  Recommend Heme consult and possible  neupogen therapy. Anemia of CKD and iron deficiency - low TSAT will order IV feraheme and follow.  Hgb dropped to 6.4 and s/p blood transfusion today. Hyponatremia - mild in setting of volume overload.  Follow with diuresis. Recurrent bilateral pleural effusions - s/p bilateral PleurX catheters on 01/01/21.  Drain per primary svc. HTN - stable DM - per primary Thrombocytopenia - chronic issue  Donetta Potts, MD [facility] [phone]

## 2021-01-09 NOTE — Progress Notes (Signed)
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  After drainage patient started to have chest discomfort.  Yesterday home health RN noticed that patient was having increasing swelling of lower extremities so she did not drain the Pleurx catheter and asked him to go to the ED for further evaluation.  In the ED CT chest showed pulmonary edema and pleural effusion right more than left, worsening kidney function with creatinine 4.7 compared to 4.3, 3 days ago.  He was started on vancomycin and cefepime for possible HCAP. Antibiotics were discontinued as infection was not suspected by the admitting physician.   Subjective   Patient seen and examined, CBG was low this morning.  Required half amp of D50 and CBG dropped to 106.  Patient is currently on Levemir 10 units subcu daily along with sliding scale insulin with NovoLog.   Assessment/Plan:     Acute on chronic diastolic CHF -Nephrology had increased Lasix to 120 mg 3 times daily -I no significant improvement, BUN/creatinine are still elevated at 85/5.80 -Echocardiogram obtained shows EF 60 to 65%, moderate LVH, grade 2 left ventricle diastolic dysfunction -improvement with Lasix so likely will require hemodialysis.  Have called PCCM for transfer to ICU for CRRT. -Temporary hemodialysis catheter for dialysis as per nephrology   Neutropenia ; pancytopenia -Secondary to Myfortic -WBC down to 1.1,  neutrophil 55% -Nephrology has discontinued Myfortic -Continue neutropenic precautions -Patient is already on antibiotics, ID following   Bilateral pleural effusion -Likely from underlying congestive heart failure -Has Pleurx catheter placed bilaterally -Continue  Pleurx catheter drainage every other day   Enterococcus bacteremia -ID has been consulted -Patient started on ceftriaxone, ampicillin -TTE shows no vegetation -Might require TEE    CKD stage IV -He has proteinuria -Renal function is worsening, BUN/creatinine is 85/5.80 -Nephrology is planning to start CRRT today -PCCM has been consulted for transfer to ICU   Diabetes mellitus type 2 -Became hypoglycemic this morning, requiring half amp of D50 -Glipizide was held yesterday  -We will discontinue evemir 10 units subcu daily -Continue sliding scale insulin with NovoLog   Anemia -Hemoglobin is 6.9, likely from pancytopenia -We will require blood transfusion -We will let nephrology decide timing of blood transfusion as patient is going for CRRT today.    Scheduled medications:    aspirin  81 mg Oral Daily   atorvastatin  10 mg Oral Daily   carvedilol  3.125 mg Oral BID WC   Chlorhexidine Gluconate Cloth  6 each Topical Q0600   clopidogrel  75 mg Oral Daily   dextrose       docusate sodium  100 mg Oral Daily   feeding supplement  237 mL Oral TID BM   fenofibrate  160 mg Oral Daily   insulin aspart  0-9 Units Subcutaneous TID WC   multivitamin with minerals  1 tablet Oral Daily   pantoprazole  40 mg Oral Daily   sodium bicarbonate  650 mg Oral BID   sodium chloride flush  3 mL Intravenous Q12H  tacrolimus  1 mg Oral BID         Data Reviewed:   CBG:  Recent Labs  Lab 01/09/21 0739 01/09/21 0805 01/09/21 0832 01/09/21 0921 01/09/21 1003  GLUCAP 55* 65* 96 68* 104*    SpO2: 97 % O2 Flow Rate (L/min): 2 L/min    Vitals:   01/09/21 0650 01/09/21 0653 01/09/21 0712 01/09/21 0915  BP:   115/74  (!) 118/57  Pulse:  80    Resp:  19  (!) 21  Temp:  97.8 F (36.6 C) 98 F (36.7 C) 97.8 F (36.6 C)  TempSrc:  Oral Oral Oral  SpO2: 99% 100%  97%  Weight:      Height:         Intake/Output Summary (Last 24 hours) at 01/09/2021 1043 Last data filed at 01/09/2021 0915 Gross per 24 hour  Intake 1796.96 ml  Output 1275 ml  Net 521.96 ml    06/09 1901 - 06/11 0700 In: 2020.1 [P.O.:920; I.V.:15.2] Out: 1275 [Urine:275]  Filed Weights   01/07/21 0000 01/08/21 0514 01/09/21 0500  Weight: 117.3 kg 118.2 kg 119.8 kg    CBC:  Recent Labs  Lab 01/05/21 1550 01/07/21 0527 01/07/21 1601 01/08/21 0311 01/09/21 0311  WBC 4.9 1.6*  --  1.3* 1.2*  HGB 8.7* 6.4* 7.3* 7.1* 6.9*  HCT 26.7* 20.3* 22.9* 22.3* 21.7*  PLT 105* 98*  --  90* 90*  MCV 80.9 82.9  --  84.5 83.1  MCH 26.4 26.1  --  26.9 26.4  MCHC 32.6 31.5  --  31.8 31.8  RDW 17.0* 17.3*  --  17.2* 17.1*  LYMPHSABS 0.4*  --   --   --  0.3*  MONOABS 0.4  --   --   --  0.2  EOSABS 0.1  --   --   --  0.1  BASOSABS 0.0  --   --   --  0.0    Complete metabolic panel:  Recent Labs  Lab 01/05/21 1550 01/05/21 1636 01/05/21 1950 01/06/21 0240 01/07/21 0527 01/08/21 0311 01/09/21 0311  NA 130*  --   --  132* 137 138 138  K 4.2  --   --  4.2 4.6 4.7 4.8  CL 102  --   --  102 102 105 103  CO2 19*  --   --  16* 21* 17* 21*  GLUCOSE 193*  --   --  175* 143* 107* 88  BUN 71*  --   --  77* 80* 82* 85*  CREATININE 4.71*  --   --  4.72* 5.32* 5.34* 5.80*  CALCIUM 7.3*  --   --  7.7* 8.2* 8.2* 8.5*  AST 18  --   --   --  21  --   --   ALT 12  --   --   --  15  --   --   ALKPHOS 129*  --   --   --  92  --   --   BILITOT 0.5  --   --   --  0.6  --   --   ALBUMIN 1.8*  --   --   --  2.5* 2.8* 2.9*  LATICACIDVEN  --  1.0 0.8  --   --   --   --   INR 1.2  --   --   --   --   --   --     No results for  input(s): LIPASE, AMYLASE in the last 168 hours.  Recent Labs  Lab 01/05/21 2057  SARSCOV2NAA NEGATIVE     ------------------------------------------------------------------------------------------------------------------ No results for input(s): CHOL, HDL, LDLCALC, TRIG, CHOLHDL, LDLDIRECT in the last 72 hours.   Lab Results  Component Value Date   HGBA1C 5.2 12/31/2020   ------------------------------------------------------------------------------------------------------------------ No results for input(s): TSH, T4TOTAL, T3FREE, THYROIDAB in the last 72 hours.  Invalid input(s): FREET3 ------------------------------------------------------------------------------------------------------------------ No results for input(s): VITAMINB12, FOLATE, FERRITIN, TIBC, IRON, RETICCTPCT in the last 72 hours.   Coagulation profile Recent Labs  Lab 01/05/21 1550  INR 1.2   No results for input(s): DDIMER in the last 72 hours.  Cardiac Enzymes No results for input(s): CKTOTAL, CKMB, CKMBINDEX, TROPONINI in the last 168 hours.  ------------------------------------------------------------------------------------------------------------------    Component Value Date/Time   BNP 1,403.3 (H) 06/04/2020 [id number]     Antibiotics: Anti-infectives (From admission, onward)    Start     Dose/Rate Route Frequency Ordered Stop   01/06/21 1500  cefTRIAXone (ROCEPHIN) 2 g in sodium chloride 0.9 % 100 mL IVPB        2 g 200 mL/hr over 30 Minutes Intravenous Every 12 hours 01/06/21 1204     01/06/21 1400  ampicillin (OMNIPEN) 2 g in sodium chloride 0.9 % 100 mL IVPB  Status:  Discontinued        2 g 300 mL/hr over 20 Minutes Intravenous Every 8 hours 01/06/21 1204 01/06/21 1205   01/06/21 1300  ampicillin (OMNIPEN) 2 g in sodium chloride 0.9 % 100 mL IVPB        2 g 300 mL/hr over 20 Minutes Intravenous Every 8 hours 01/06/21 1205     01/05/21 2200  doxycycline (VIBRA-TABS) tablet 100 mg  Status:  Discontinued        100 mg Oral Every 12 hours 01/05/21 1849 01/06/21 1433   01/05/21 1630  vancomycin  (VANCOREADY) IVPB 2000 mg/400 mL        2,000 mg 200 mL/hr over 120 Minutes Intravenous  Once 01/05/21 1616 01/05/21 2015   01/05/21 1630  ceFEPIme (MAXIPIME) 2 g in sodium chloride 0.9 % 100 mL IVPB        2 g 200 mL/hr over 30 Minutes Intravenous  Once 01/05/21 1616 01/05/21 1742        Radiology Reports  IR Fluoro Guide CV Line Right  Result Date: 01/08/2021 INDICATION: 65 year old male in need hemodialysis. Placement of a temporary non tunneled hemodialysis catheter requested. EXAM: IR RIGHT FLUORO GUIDE CV LINE MEDICATIONS: None ANESTHESIA/SEDATION: None FLUOROSCOPY TIME:  Fluoroscopy Time: 0 minutes 24 seconds (14 mGy). COMPLICATIONS: None immediate. PROCEDURE: Informed written consent was obtained from the patient after a thorough discussion of the procedural risks, benefits and alternatives. All questions were addressed. Maximal Sterile Barrier Technique was utilized including caps, mask, sterile gowns, sterile gloves, sterile drape, hand hygiene and skin antiseptic. A timeout was performed prior to the initiation of the procedure. The right internal jugular vein was interrogated with ultrasound and found to be widely patent. An image was obtained and stored for the medical record. Local anesthesia was attained by infiltration with 1% lidocaine. A small dermatotomy was made. Under real-time sonographic guidance, the vessel was punctured with a 21 gauge micropuncture needle. Using standard technique, the initial micro needle was exchanged over a 0.018 micro wire for a transitional 4 [place] micro sheath. The micro sheath was then exchanged over a 0.035 wire for a fascial dilator and the soft tissue tract was dilated. A 20  cm triple-lumen non tunneled hemodialysis catheter was then advanced over the wire and positioned with the tip in the mid right atrium. The catheter was flushed and locked with heparinized saline. Images were obtained and stored for the medical record. The catheter was capped  and secured to the skin with 0 Prolene suture. IMPRESSION: Successful placement of right IJ 20 cm non tunneled triple-lumen hemodialysis catheter. Electronically Signed   By: Jacqulynn Cadet M.D.   On: 01/08/2021 18:17   ECHOCARDIOGRAM COMPLETE  Result Date: 01/07/2021    ECHOCARDIOGRAM REPORT   Patient Name:   Steven [name] Date of Exam: 01/07/2021 Medical Rec #:  [id number]       Height:       71.0 in Accession #:    [id number]      Weight:       258.6 lb Date of Birth:  10-16-1955       BSA:          2.352 m Patient Age:    52 years        BP:           125/64 mmHg Patient Gender: M               HR:           83 bpm. Exam Location:  Inpatient Procedure: 2D Echo, Cardiac Doppler and Color Doppler Indications:    bacteremia  History:        Patient has prior history of Echocardiogram examinations, most                 recent 10/09/2020. Aortic Valve Disease; Risk                 Factors:Hypertension and Dyslipidemia.  Sonographer:    Cammy Brochure Referring Phys: Earnie Larsson Karina Nofsinger  Sonographer Comments: Patient is morbidly obese. Image acquisition challenging due to patient body habitus, Image acquisition challenging due to respiratory motion and bandages obscuring windows. IMPRESSIONS  1. Left ventricular ejection fraction, by estimation, is 60 to 65%. The left ventricle has normal function. The left ventricle has no regional wall motion abnormalities. There is moderate left ventricular hypertrophy. Left ventricular diastolic parameters are consistent with Grade II diastolic dysfunction (pseudonormalization).  2. Right ventricular systolic function is normal. The right ventricular size is mildly enlarged. Tricuspid regurgitation signal is inadequate for assessing PA pressure.  3. Left atrial size was mildly dilated.  4. The mitral valve is abnormal with severe calcification of the valve and annulus. Cannot rule out endocarditis from this study. No evidence of mitral valve regurgitation. Moderate mitral  stenosis. The mean mitral valve gradient is 6.0 mmHg, MVA 1.7 cm^2 by VTI.  5. Bioprosthetic aortic valve s/p TAVR. No significant peri-valvular leakage noted. Mean gradient 12 mmHg. No vegetation seen on the bioprosthetic aortic valve but cannot fully rule out endocarditis from this study.  6. The inferior vena cava is normal in size with greater than 50% respiratory variability, suggesting right atrial pressure of 3 mmHg.  7. Cannot rule out endocarditis from this study. FINDINGS  Left Ventricle: Left ventricular ejection fraction, by estimation, is 60 to 65%. The left ventricle has normal function. The left ventricle has no regional wall motion abnormalities. The left ventricular internal cavity size was normal in size. There is  moderate left ventricular hypertrophy. Left ventricular diastolic parameters are consistent with Grade II diastolic dysfunction (pseudonormalization). Right Ventricle: The right ventricular size is mildly enlarged. No increase in  right ventricular wall thickness. Right ventricular systolic function is normal. Tricuspid regurgitation signal is inadequate for assessing PA pressure. Left Atrium: Left atrial size was mildly dilated. Right Atrium: Right atrial size was normal in size. Pericardium: Trivial pericardial effusion is present. Mitral Valve: The mitral valve is abnormal. There is severe thickening of the mitral valve leaflet(s). There is mild calcification of the mitral valve leaflet(s). No evidence of mitral valve regurgitation. Moderate mitral valve stenosis. MV peak gradient, 14.1 mmHg. The mean mitral valve gradient is 6.0 mmHg. Tricuspid Valve: The tricuspid valve is normal in structure. Tricuspid valve regurgitation is not demonstrated. Aortic Valve: Bioprosthetic aortic valve s/p TAVR. No significant peri-valvular leakage noted. Mean gradient 12 mmHg. The aortic valve has been repaired/replaced. Aortic valve regurgitation is not visualized. Aortic valve mean gradient measures  12.0 mmHg. Aortic valve peak gradient measures 17.5 mmHg. Aortic valve area, by VTI measures 2.01 cm. There is a 26 mm Sapien prosthetic, stented (TAVR) valve present in the aortic position. Pulmonic Valve: The pulmonic valve was normal in structure. Pulmonic valve regurgitation is not visualized. Aorta: The aortic root is normal in size and structure. Venous: The inferior vena cava is normal in size with greater than 50% respiratory variability, suggesting right atrial pressure of 3 mmHg. IAS/Shunts: No atrial level shunt detected by color flow Doppler.  LEFT VENTRICLE PLAX 2D LVIDd:         5.60 cm  Diastology LVIDs:         3.50 cm  LV e' medial:    4.68 cm/s LV PW:         1.10 cm  LV E/e' medial:  39.3 LV IVS:        1.00 cm  LV e' lateral:   5.98 cm/s LVOT diam:     2.15 cm  LV E/e' lateral: 30.8 LV SV:         81 LV SV Index:   34 LVOT Area:     3.63 cm  RIGHT VENTRICLE             IVC RV Basal diam:  4.90 cm     IVC diam: 1.70 cm RV S prime:     10.90 cm/s TAPSE (M-mode): 1.9 cm LEFT ATRIUM         Index LA diam:    4.50 cm 1.91 cm/m  AORTIC VALVE AV Area (Vmax):    1.87 cm AV Area (Vmean):   1.78 cm AV Area (VTI):     2.01 cm AV Vmax:           209.33 cm/s AV Vmean:          147.333 cm/s AV VTI:            0.402 m AV Peak Grad:      17.5 mmHg AV Mean Grad:      12.0 mmHg LVOT Vmax:         108.00 cm/s LVOT Vmean:        72.300 cm/s LVOT VTI:          0.223 m LVOT/AV VTI ratio: 0.55  AORTA Ao Root diam: 2.70 cm MITRAL VALVE MV Area (PHT): 3.50 cm     SHUNTS MV Area VTI:   1.73 cm     Systemic VTI:  0.22 m MV Peak grad:  14.1 mmHg    Systemic Diam: 2.15 cm MV Mean grad:  6.0 mmHg MV Vmax:       1.88 m/s MV Vmean:  108.0 cm/s MV Decel Time: 217 msec MV E velocity: 184.00 cm/s MV A velocity: 100.00 cm/s MV E/A ratio:  1.84 Loralie Champagne MD Electronically signed by Loralie Champagne MD Signature Date/Time: 01/07/2021/3:08:19 PM    Final        DVT prophylaxis: Foot pumps  Code Status: Full  code  Family Communication: Discussed with patient's wife at bedside   Consultants: Nephrology  Procedures:     Objective    Physical Examination:  General-appears in no acute distress Heart-S1-S2, regular, no murmur auscultated Lungs-clear to auscultation bilaterally, no wheezing or crackles auscultated Abdomen-soft, nontender, no organomegaly Extremities-bilateral 1+ edema in the lower extremities Neuro-alert, oriented x3, no focal deficit noted  Status is: Inpatient  Dispo: The patient is from: Home              Anticipated d/c is to: Home               Anticipated d/c date is: 01/13/2021              Patient currently not stable for discharge  Barrier to discharge-ongoing treatment for acute CHF  COVID-19 Labs  No results for input(s): DDIMER, FERRITIN, LDH, CRP in the last 72 hours.   Lab Results  Component Value Date   [place] NEGATIVE 01/05/2021   [place] NEGATIVE 12/09/2020   [place] NEGATIVE 10/26/2020   [place] NEGATIVE 09/05/2020    Microbiology  Recent Results (from the past 240 hour(s))  Surgical pcr screen     Status: Abnormal   Collection Time: 12/31/20  8:46 AM   Specimen: Nasal Mucosa; Nasal Swab  Result Value Ref Range Status   MRSA, PCR NEGATIVE NEGATIVE Final   Staphylococcus aureus POSITIVE (A) NEGATIVE Final    Comment: (NOTE) The Xpert SA Assay (FDA approved for NASAL specimens in patients 59 years of age and older), is one component of a comprehensive surveillance program. It is not intended to diagnose infection nor to guide or monitor treatment. Performed at [facility], [place] [address] [place], [place] [address]   Culture, blood (x 2)     Status: Abnormal   Collection Time: 01/05/21  4:36 PM   Specimen: BLOOD LEFT FOREARM  Result Value Ref Range Status   Specimen Description BLOOD LEFT FOREARM  Final   Special Requests   Final    BOTTLES DRAWN AEROBIC AND ANAEROBIC Blood Culture adequate volume    Culture  Setup Time   Final    GRAM POSITIVE COCCI IN BOTH AEROBIC AND ANAEROBIC BOTTLES CRITICAL RESULT CALLED TO, READ BACK BY AND VERIFIED WITH: PHARMD JEREMY FRENS AT [id number] [place] ON 01/06/21 BY KJ Performed at [facility], [place] [address] [place], [place] [address]    Culture ENTEROCOCCUS FAECALIS (A)  Final   Report Status 01/08/2021 FINAL  Final   Organism ID, Bacteria ENTEROCOCCUS FAECALIS  Final      Susceptibility   Enterococcus faecalis - MIC*    AMPICILLIN <=2 SENSITIVE Sensitive     VANCOMYCIN 1 SENSITIVE Sensitive     GENTAMICIN SYNERGY SENSITIVE Sensitive     * ENTEROCOCCUS FAECALIS  Blood Culture ID Panel (Reflexed)     Status: Abnormal   Collection Time: 01/05/21  4:36 PM  Result Value Ref Range Status   Enterococcus faecalis DETECTED (A) NOT DETECTED Final    Comment: CRITICAL RESULT CALLED TO, READ BACK BY AND VERIFIED WITH: [place] [id number] ON 6/85/22 BY KJ    Enterococcus Faecium  NOT DETECTED NOT DETECTED Corrected    Comment: CORRECTED ON 06/08 AT 1234: PREVIOUSLY REPORTED AS NOT DETECTED CRITICAL RESULT CALLED TO, READ BACK BY AND VERIFIED WITH: PHARMD JEREMY FRENS AT [id number] ON 6/85/22 BY KJ   Listeria monocytogenes NOT DETECTED NOT DETECTED Final   Staphylococcus species NOT DETECTED NOT DETECTED Final   Staphylococcus aureus (BCID) NOT DETECTED NOT DETECTED Final   Staphylococcus epidermidis NOT DETECTED NOT DETECTED Final   Staphylococcus lugdunensis NOT DETECTED NOT DETECTED Final   Streptococcus species NOT DETECTED NOT DETECTED Final   Streptococcus agalactiae NOT DETECTED NOT DETECTED Final   Streptococcus pneumoniae NOT DETECTED NOT DETECTED Final   Streptococcus pyogenes NOT DETECTED NOT DETECTED Final   A.calcoaceticus-baumannii NOT DETECTED NOT DETECTED Final   Bacteroides fragilis NOT DETECTED NOT DETECTED Final   Enterobacterales NOT DETECTED NOT DETECTED Final   Enterobacter cloacae complex NOT DETECTED NOT DETECTED Final    Escherichia coli NOT DETECTED NOT DETECTED Final   Klebsiella aerogenes NOT DETECTED NOT DETECTED Final   Klebsiella oxytoca NOT DETECTED NOT DETECTED Final   Klebsiella pneumoniae NOT DETECTED NOT DETECTED Final   Proteus species NOT DETECTED NOT DETECTED Final   Salmonella species NOT DETECTED NOT DETECTED Final   Serratia marcescens NOT DETECTED NOT DETECTED Final   Haemophilus influenzae NOT DETECTED NOT DETECTED Final   Neisseria meningitidis NOT DETECTED NOT DETECTED Final   Pseudomonas aeruginosa NOT DETECTED NOT DETECTED Final   Stenotrophomonas maltophilia NOT DETECTED NOT DETECTED Final   Candida albicans NOT DETECTED NOT DETECTED Final   Candida auris NOT DETECTED NOT DETECTED Final   Candida glabrata NOT DETECTED NOT DETECTED Final   Candida krusei NOT DETECTED NOT DETECTED Final   Candida parapsilosis NOT DETECTED NOT DETECTED Final   Candida tropicalis NOT DETECTED NOT DETECTED Final   Cryptococcus neoformans/gattii NOT DETECTED NOT DETECTED Final   Vancomycin resistance NOT DETECTED NOT DETECTED Final    Comment: Performed at [facility], [address], [place], [place] [address]  Culture, blood (x 2)     Status: None (Preliminary result)   Collection Time: 01/05/21  7:26 PM   Specimen: BLOOD  Result Value Ref Range Status   Specimen Description BLOOD SITE NOT SPECIFIED  Final   Special Requests   Final    BOTTLES DRAWN AEROBIC AND ANAEROBIC Blood Culture results may not be optimal due to an inadequate volume of blood received in culture bottles   Culture   Final    NO GROWTH 4 DAYS Performed at [facility], [place] [address], [place], [place] [address]    Report Status PENDING  Incomplete  Resp Panel by RT-PCR (Flu A&B, Covid) Nasopharyngeal Swab     Status: None   Collection Time: 01/05/21  8:57 PM   Specimen: Nasopharyngeal Swab; Nasopharyngeal(NP) swabs in vial transport medium  Result Value Ref Range Status   SARS Coronavirus 2 by RT PCR NEGATIVE  NEGATIVE Final    Comment: (NOTE) SARS-CoV-2 target nucleic acids are NOT DETECTED.  The SARS-CoV-2 RNA is generally detectable in upper respiratory specimens during the acute phase of infection. The lowest concentration of SARS-CoV-2 viral copies this assay can detect is 138 copies/mL. A negative result does not preclude SARS-Cov-2 infection and should not be used as the sole basis for treatment or other patient management decisions. A negative result may occur with  improper specimen collection/handling, submission of specimen other than nasopharyngeal swab, presence of viral mutation(s) within the areas targeted by this assay,  and inadequate number of viral copies(<138 copies/mL). A negative result must be combined with clinical observations, patient history, and epidemiological information. The expected result is Negative.  Fact Sheet for Patients:  EntrepreneurPulse.com.au  Fact Sheet for Healthcare Providers:  IncredibleEmployment.be  This test is no t yet approved or cleared by the [place] FDA and  has been authorized for detection and/or diagnosis of SARS-CoV-2 by FDA under an Emergency Use Authorization (EUA). This EUA will remain  in effect (meaning this test can be used) for the duration of the COVID-19 declaration under Section 564(b)(1) of the Act, 21 U.S.C.section 360bbb-3(b)(1), unless the authorization is terminated  or revoked sooner.       Influenza A by PCR NEGATIVE NEGATIVE Final   Influenza B by PCR NEGATIVE NEGATIVE Final    Comment: (NOTE) The Xpert Xpress SARS-CoV-2/FLU/RSV plus assay is intended as an aid in the diagnosis of influenza from Nasopharyngeal swab specimens and should not be used as a sole basis for treatment. Nasal washings and aspirates are unacceptable for Xpert Xpress SARS-CoV-2/FLU/RSV testing.  Fact Sheet for Patients: EntrepreneurPulse.com.au  Fact Sheet for Healthcare  Providers: IncredibleEmployment.be  This test is not yet approved or cleared by the [place] FDA and has been authorized for detection and/or diagnosis of SARS-CoV-2 by FDA under an Emergency Use Authorization (EUA). This EUA will remain in effect (meaning this test can be used) for the duration of the COVID-19 declaration under Section 564(b)(1) of the Act, 21 U.S.C. section 360bbb-3(b)(1), unless the authorization is terminated or revoked.  Performed at [facility], [place] [address] [place], [place] [address]   Respiratory (~20 pathogens) panel by PCR     Status: None   Collection Time: 01/05/21 11:26 PM  Result Value Ref Range Status   Adenovirus NOT DETECTED NOT DETECTED Final   Coronavirus 229E NOT DETECTED NOT DETECTED Final    Comment: (NOTE) The Coronavirus on the Respiratory Panel, DOES NOT test for the novel  Coronavirus (2019 nCoV)    Coronavirus HKU1 NOT DETECTED NOT DETECTED Final   Coronavirus NL63 NOT DETECTED NOT DETECTED Final   Coronavirus OC43 NOT DETECTED NOT DETECTED Final   Metapneumovirus NOT DETECTED NOT DETECTED Final   Rhinovirus / Enterovirus NOT DETECTED NOT DETECTED Final   Influenza A NOT DETECTED NOT DETECTED Final   Influenza B NOT DETECTED NOT DETECTED Final   Parainfluenza Virus 1 NOT DETECTED NOT DETECTED Final   Parainfluenza Virus 2 NOT DETECTED NOT DETECTED Final   Parainfluenza Virus 3 NOT DETECTED NOT DETECTED Final   Parainfluenza Virus 4 NOT DETECTED NOT DETECTED Final   Respiratory Syncytial Virus NOT DETECTED NOT DETECTED Final   Bordetella pertussis NOT DETECTED NOT DETECTED Final   Bordetella Parapertussis NOT DETECTED NOT DETECTED Final   Chlamydophila pneumoniae NOT DETECTED NOT DETECTED Final   Mycoplasma pneumoniae NOT DETECTED NOT DETECTED Final    Comment: Performed at [facility], [place]. [address], [place], [place] [address]  Culture, blood (routine x 2)     Status: None (Preliminary result)    Collection Time: 01/07/21  5:27 AM   Specimen: BLOOD  Result Value Ref Range Status   Specimen Description BLOOD RIGHT ANTECUBITAL  Final   Special Requests   Final    BOTTLES DRAWN AEROBIC AND ANAEROBIC Blood Culture adequate volume   Culture   Final    NO GROWTH 2 DAYS Performed at [facility], [place] [address] [place], [place] [address]  Report Status PENDING  Incomplete  Culture, blood (routine x 2)     Status: None (Preliminary result)   Collection Time: 01/07/21  5:27 AM   Specimen: BLOOD RIGHT HAND  Result Value Ref Range Status   Specimen Description BLOOD RIGHT HAND  Final   Special Requests   Final    BOTTLES DRAWN AEROBIC AND ANAEROBIC Blood Culture adequate volume   Culture   Final    NO GROWTH 2 DAYS Performed at [facility], [address] [place], [place] [address]    Report Status PENDING  Incomplete    Pressure Injury 06/08/20 Coccyx Stage 1 -  Intact skin with non-blanchable redness of a localized area usually over a bony prominence. (Active)  06/08/20 1500  Location: Coccyx  Location Orientation:   Staging: Stage 1 -  Intact skin with non-blanchable redness of a localized area usually over a bony prominence.  Wound Description (Comments):   Present on Admission: Yes (present upon transfer)          [facility] If 7PM-7AM, please contact night-coverage at [email or web address], Office  [phone]   01/09/2021, 10:43 AM  LOS: 4 days

## 2021-01-09 NOTE — Progress Notes (Signed)
Pt. With critical Hgb of 6.9, WBC of 1.1. On call for [facility] paged to make aware.

## 2021-01-10 LAB — RENAL FUNCTION PANEL
Albumin: 2.8 g/dL — ABNORMAL LOW (ref 3.5–5.0)
Albumin: 2.5 g/dL — ABNORMAL LOW (ref 3.5–5.0)
Anion gap: 10 (ref 5–15)
Anion gap: 7 (ref 5–15)
BUN: 60 mg/dL — ABNORMAL HIGH (ref 8–23)
BUN: 49 mg/dL — ABNORMAL HIGH (ref 8–23)
CO2: 23 mmol/L (ref 22–32)
CO2: 26 mmol/L (ref 22–32)
Calcium: 8.1 mg/dL — ABNORMAL LOW (ref 8.9–10.3)
Calcium: 7.8 mg/dL — ABNORMAL LOW (ref 8.9–10.3)
Chloride: 104 mmol/L (ref 98–111)
Chloride: 103 mmol/L (ref 98–111)
Creatinine, Ser: 4.2 mg/dL — ABNORMAL HIGH (ref 0.61–1.24)
Creatinine, Ser: 3.45 mg/dL — ABNORMAL HIGH (ref 0.61–1.24)
GFR, Estimated: 15 mL/min — ABNORMAL LOW (ref >60)
GFR, Estimated: 19 mL/min — ABNORMAL LOW (ref >60)
Glucose, Bld: 61 mg/dL — ABNORMAL LOW (ref 70–99)
Glucose, Bld: 222 mg/dL — ABNORMAL HIGH (ref 70–99)
Phosphorus: 3.5 mg/dL (ref 2.5–4.6)
Phosphorus: 3.1 mg/dL (ref 2.5–4.6)
Potassium: 4.4 mmol/L (ref 3.5–5.1)
Potassium: 5.2 mmol/L — ABNORMAL HIGH (ref 3.5–5.1)
Sodium: 137 mmol/L (ref 135–145)
Sodium: 136 mmol/L (ref 135–145)

## 2021-01-10 LAB — BPAM RBC
Blood Product Expiration Date: 202207082359
Blood Product Expiration Date: 202207102359
ISSUE DATE / TIME: 202206091019
ISSUE DATE / TIME: 202206110623
Unit Type and Rh: 5100
Unit Type and Rh: 5100

## 2021-01-10 LAB — TYPE AND SCREEN
ABO/RH(D): O POS
Antibody Screen: NEGATIVE
Unit division: 0
Unit division: 0

## 2021-01-10 LAB — CBC
HCT: 24.5 % — ABNORMAL LOW (ref 39.0–52.0)
Hemoglobin: 7.9 g/dL — ABNORMAL LOW (ref 13.0–17.0)
MCH: 26.4 pg (ref 26.0–34.0)
MCHC: 32.2 g/dL (ref 30.0–36.0)
MCV: 81.9 fL (ref 80.0–100.0)
Platelets: 107 10*3/uL — ABNORMAL LOW (ref 150–400)
RBC: 2.99 MIL/uL — ABNORMAL LOW (ref 4.22–5.81)
RDW: 16.5 % — ABNORMAL HIGH (ref 11.5–15.5)
WBC: 2.5 10*3/uL — ABNORMAL LOW (ref 4.0–10.5)
nRBC: 0 % (ref 0.0–0.2)

## 2021-01-10 LAB — GLUCOSE, CAPILLARY
Glucose-Capillary: 107 mg/dL — ABNORMAL HIGH (ref 70–99)
Glucose-Capillary: 159 mg/dL — ABNORMAL HIGH (ref 70–99)
Glucose-Capillary: 201 mg/dL — ABNORMAL HIGH (ref 70–99)
Glucose-Capillary: 233 mg/dL — ABNORMAL HIGH (ref 70–99)
Glucose-Capillary: 49 mg/dL — ABNORMAL LOW (ref 70–99)
Glucose-Capillary: 53 mg/dL — ABNORMAL LOW (ref 70–99)
Glucose-Capillary: 58 mg/dL — ABNORMAL LOW (ref 70–99)
Glucose-Capillary: 70 mg/dL (ref 70–99)
Glucose-Capillary: 75 mg/dL (ref 70–99)
Glucose-Capillary: 87 mg/dL (ref 70–99)
Glucose-Capillary: 98 mg/dL (ref 70–99)

## 2021-01-10 LAB — CULTURE, BLOOD (ROUTINE X 2): Culture: NO GROWTH

## 2021-01-10 LAB — MAGNESIUM: Magnesium: 1.6 mg/dL — ABNORMAL LOW (ref 1.7–2.4)

## 2021-01-10 MED ORDER — MAGNESIUM SULFATE 2 GM/50ML IV SOLN
2.0000 g | Freq: Once | INTRAVENOUS | Status: AC
  Administered 2021-01-10: 2 g via INTRAVENOUS
  Filled 2021-01-10: qty 50

## 2021-01-10 MED ORDER — DEXTROSE 50 % IV SOLN
25.0000 g | INTRAVENOUS | Status: AC
  Administered 2021-01-10: 25 g via INTRAVENOUS

## 2021-01-10 MED ORDER — INSULIN ASPART 100 UNIT/ML IJ SOLN
0.0000 [IU] | INTRAMUSCULAR | Status: DC
  Administered 2021-01-10: 1 [IU] via SUBCUTANEOUS
  Administered 2021-01-10 (×2): 2 [IU] via SUBCUTANEOUS
  Administered 2021-01-11: 3 [IU] via SUBCUTANEOUS
  Administered 2021-01-11: 2 [IU] via SUBCUTANEOUS

## 2021-01-10 MED ORDER — DEXTROSE 50 % IV SOLN
12.5000 g | INTRAVENOUS | Status: AC
  Administered 2021-01-10: 12.5 g via INTRAVENOUS
  Filled 2021-01-10: qty 50

## 2021-01-10 MED ORDER — FUROSEMIDE 10 MG/ML IJ SOLN
120.0000 mg | Freq: Two times a day (BID) | INTRAVENOUS | Status: DC
  Administered 2021-01-10 – 2021-01-11 (×3): 120 mg via INTRAVENOUS
  Filled 2021-01-10 (×2): qty 12
  Filled 2021-01-10: qty 10
  Filled 2021-01-10: qty 12
  Filled 2021-01-10: qty 10
  Filled 2021-01-10: qty 12

## 2021-01-10 NOTE — Progress Notes (Signed)
Notified Sr. Schertz of potassium level of 5.2, orders are being written to change solution on CRRT in the next little while per Dr. Jonnie Finner.  Consuella Lose, RN

## 2021-01-10 NOTE — Progress Notes (Signed)
Notified by RN that the patient was hypoglycemic on 2 subsequent CBG checks with blood glucose 49 >58.  He is being given half amp of D50 and RN will recheck blood sugar in 15 minutes.  Per review of progress note from Dr. Darrick Meigs, patient was hypoglycemic yesterday morning as well and was given half amp of D50.  His glipizide was held and Levemir was discontinued.  Currently on sliding scale insulin TID (has not received any overnight).  I have changed orders for sliding scale insulin to very sensitive every 4 hours.  Continue frequent CBG checks.  Since the patient is now being medically managed by PCCM service, I have advised RN to contact his primary treatment team.

## 2021-01-10 NOTE — Progress Notes (Signed)
eLink Physician-Brief Progress Note Patient Name: Steven [name] DOB: 02/16/1956 MRN: [id number]   Date of Service  01/10/2021  HPI/Events of Note  Mg 1.6.  eICU Interventions  Ordered 2g magnesium IV     Intervention Category Minor Interventions: Electrolytes abnormality - evaluation and management  Marily Lente Algis Lehenbauer 01/10/2021, 6:12 AM

## 2021-01-10 NOTE — Progress Notes (Addendum)
Patient ID: Steven [name], male   DOB: 1955/09/16, 65 y.o.   MRN: [id number] S: STarted on CRRT yesterday with 2 liters UF.  Still somewhat confused but better.  Pt seen and examined while on CRRT.  Labs and orders reviewed for adjustments if needed. O:BP 114/60 (BP Location: Left Arm)   Pulse 82   Temp (!) 96.9 F (36.1 C) (Oral)   Resp 15   Ht '5\' 11"'$  (1.803 m)   Wt 116.2 kg   SpO2 91%   BMI 35.73 kg/m   Intake/Output Summary (Last 24 hours) at 01/10/2021 1000 Last data filed at 01/10/2021 0900 Gross per 24 hour  Intake 2510 ml  Output 4692 ml  Net -2182 ml   Intake/Output: I/O last 3 completed shifts: In: 3518.2 [P.O.:1397; I.V.:120.2; Blood:363.3; IV Piggyback:1637.7] Out: [id number] [Urine:275; DS:[id number]; Stool:1]  Intake/Output this shift:  Total I/O In: 568.8 [P.O.:480; I.V.:0.9; IV Piggyback:87.8] Out: 663 [Other:663] Weight change: -3.6 kg Gen: NAD CVS:RRR Resp: decreased BS at bases [id number], +BS, soft, NT Ext: 2+ edema RLE, s/p LBKA  Recent Labs  Lab 01/05/21 1550 01/06/21 0240 01/07/21 0527 01/08/21 0311 01/09/21 0311 01/09/21 1524 01/10/21 0403  NA 130* 132* 137 138 138 138 137  K 4.2 4.2 4.6 4.7 4.8 5.0 4.4  CL 102 102 102 105 103 105 104  CO2 19* 16* 21* 17* 21* 22 23  GLUCOSE 193* 175* 143* 107* 88 99 61*  BUN 71* 77* 80* 82* 85* 83* 60*  CREATININE 4.71* 4.72* 5.32* 5.34* 5.80* 5.62* 4.20*  ALBUMIN 1.8*  --  2.5* 2.8* 2.9* 2.8* 2.8*  CALCIUM 7.3* 7.7* 8.2* 8.2* 8.5* 8.2* 8.1*  PHOS  --   --  5.4* 5.8* 6.1* 5.3* 3.5  AST 18  --  21  --   --   --   --   ALT 12  --  15  --   --   --   --    Liver Function Tests: Recent Labs  Lab 01/05/21 1550 01/07/21 0527 01/08/21 0311 01/09/21 0311 01/09/21 1524 01/10/21 0403  AST 18 21  --   --   --   --   ALT 12 15  --   --   --   --   ALKPHOS 129* 92  --   --   --   --   BILITOT 0.5 0.6  --   --   --   --   PROT 4.6* 4.9*  --   --   --   --   ALBUMIN 1.8* 2.5*   < > 2.9* 2.8* 2.8*   < > = values in  this interval not displayed.   No results for input(s): LIPASE, AMYLASE in the last 168 hours. No results for input(s): AMMONIA in the last 168 hours. CBC: Recent Labs  Lab 01/05/21 1550 01/07/21 0527 01/07/21 1601 01/08/21 0311 01/09/21 0311 01/09/21 1255 01/09/21 1307 01/10/21 0403  WBC 4.9 1.6*  --  1.3* 1.2*  --  1.2* 2.5*  NEUTROABS 4.0  --   --   --  0.6*  --   --   --   HGB 8.7* 6.4*   < > 7.1* 6.9* 8.2* 7.7* 7.9*  HCT 26.7* 20.3*   < > 22.3* 21.7* 25.4* 25.0* 24.5*  MCV 80.9 82.9  --  84.5 83.1  --  86.2 81.9  PLT 105* 98*  --  90* 90*  --  100* 107*   < > = values in  this interval not displayed.   Cardiac Enzymes: No results for input(s): CKTOTAL, CKMB, CKMBINDEX, TROPONINI in the last 168 hours. CBG: Recent Labs  Lab 01/10/21 0359 01/10/21 0440 01/10/21 0732 01/10/21 0802 01/10/21 0831  GLUCAP 58* 87 53* 75 98    Iron Studies: No results for input(s): IRON, TIBC, TRANSFERRIN, FERRITIN in the last 72 hours. Studies/Results: IR Fluoro Guide CV Line Right  Result Date: 01/08/2021 INDICATION: 65 year old male in need hemodialysis. Placement of a temporary non tunneled hemodialysis catheter requested. EXAM: IR RIGHT FLUORO GUIDE CV LINE MEDICATIONS: None ANESTHESIA/SEDATION: None FLUOROSCOPY TIME:  Fluoroscopy Time: 0 minutes 24 seconds (14 mGy). COMPLICATIONS: None immediate. PROCEDURE: Informed written consent was obtained from the patient after a thorough discussion of the procedural risks, benefits and alternatives. All questions were addressed. Maximal Sterile Barrier Technique was utilized including caps, mask, sterile gowns, sterile gloves, sterile drape, hand hygiene and skin antiseptic. A timeout was performed prior to the initiation of the procedure. The right internal jugular vein was interrogated with ultrasound and found to be widely patent. An image was obtained and stored for the medical record. Local anesthesia was attained by infiltration with 1%  lidocaine. A small dermatotomy was made. Under real-time sonographic guidance, the vessel was punctured with a 21 gauge micropuncture needle. Using standard technique, the initial micro needle was exchanged over a 0.018 micro wire for a transitional 4 [place] micro sheath. The micro sheath was then exchanged over a 0.035 wire for a fascial dilator and the soft tissue tract was dilated. A 20 cm triple-lumen non tunneled hemodialysis catheter was then advanced over the wire and positioned with the tip in the mid right atrium. The catheter was flushed and locked with heparinized saline. Images were obtained and stored for the medical record. The catheter was capped and secured to the skin with 0 Prolene suture. IMPRESSION: Successful placement of right IJ 20 cm non tunneled triple-lumen hemodialysis catheter. Electronically Signed   By: Jacqulynn Cadet M.D.   On: 01/08/2021 18:17    aspirin  81 mg Oral Daily   atorvastatin  10 mg Oral Daily   carvedilol  3.125 mg Oral BID WC   Chlorhexidine Gluconate Cloth  6 each Topical Q0600   clopidogrel  75 mg Oral Daily   docusate sodium  100 mg Oral Daily   feeding supplement  237 mL Oral TID BM   fenofibrate  160 mg Oral Daily   insulin aspart  0-6 Units Subcutaneous Q4H   multivitamin with minerals  1 tablet Oral Daily   pantoprazole  40 mg Oral Daily   sodium bicarbonate  650 mg Oral BID   sodium chloride flush  3 mL Intravenous Q12H   tacrolimus  1 mg Oral BID    BMET    Component Value Date/Time   NA 137 01/10/2021 0403   NA 139 11/10/2020 1137   K 4.4 01/10/2021 0403   CL 104 01/10/2021 0403   CO2 23 01/10/2021 0403   GLUCOSE 61 (L) 01/10/2021 0403   BUN 60 (H) 01/10/2021 0403   BUN 31 (H) 11/10/2020 1137   CREATININE 4.20 (H) 01/10/2021 0403   CREATININE 1.99 (H) 07/09/2020 1155   CALCIUM 8.1 (L) 01/10/2021 0403   CALCIUM 7.6 (L) 07/12/2007 0400   GFRNONAA 15 (L) 01/10/2021 0403   GFRNONAA 61 10/03/2013 1432   GFRAA 53 (L) 09/16/2020  1404   GFRAA 71 10/03/2013 1432   CBC    Component Value Date/Time   WBC 2.5 (L)  01/10/2021 0403   RBC 2.99 (L) 01/10/2021 0403   HGB 7.9 (L) 01/10/2021 0403   HCT 24.5 (L) 01/10/2021 0403   PLT 107 (L) 01/10/2021 0403   MCV 81.9 01/10/2021 0403   MCV 85.9 10/03/2013 1354   MCH 26.4 01/10/2021 0403   MCHC 32.2 01/10/2021 0403   RDW 16.5 (H) 01/10/2021 0403   LYMPHSABS 0.3 (L) 01/09/2021 0311   MONOABS 0.2 01/09/2021 0311   EOSABS 0.1 01/09/2021 0311   BASOSABS 0.0 01/09/2021 0311    HPI: Steven [name] is an 65 y.o. 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  Followed by Dr. 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  [place] with some hydro to transplanted kidney.  Possibly cardiorenal syndrome (supported by low UNa), but also concerning for nephrotic syndrome.  Will obtain 24 hour urine protein.  May need transfer to [facility] for transplant kidney biopsy. Not responding to IV lasix 120 mg tid. Consulted IR for temp HD cath placed 01/08/21 but unable to go to HD because of hypoglycemia and AMS. Transferred to ICU and started on CRRT 01/09/21 due to asterixis, AMS, low BP and massive volume overload All fluids 4K/2.5Ca, dialysate at 1500 ml/hr, pre-filter and post-filter 300 ml/hr UF 100-200 ml/hr, no heparin due to thrombocytopenia. Will UF as tolerated. Nephrotic range proteinuria, unclear if txp biopsy would be helpful but have ordered SPEP/UPEP. Acute hypoxic respiratory failure - secondary to recurrent pleural effusions. Acute on chronic CHF - ECHO  in 3/22 showed preserved EF with grade II DD and mildly increased gradient across TAVR.   Not responding to high dose IV lasix. Repeat ECHO with EF 60-65%, mod LVH, Grade II DD, normal RV function 24 hour urine protein with 6 grams/24 hours but normal lipids. UF with CRRT as above.   Immunosuppression - continue with home dose of prograf Myfortic held due to neutropenia Enterococcal Faecalis bacteremia - ID following and managing abx.  TTE pending to r/o SBE.   Pancytopenia - will stop myfortic.  Recommend Heme consult and possible neupogen therapy. Anemia of CKD and iron deficiency - low TSAT will order IV feraheme and follow.  Hgb dropped to 6.4 and s/p blood transfusion today. Hyponatremia - mild in setting of volume overload.  Follow with diuresis. Recurrent bilateral pleural effusions - s/p bilateral PleurX catheters on 01/01/21.  Drain per primary svc. HTN - stable DM - per primary Thrombocytopenia - chronic issue  Donetta Potts, MD [facility] [phone]

## 2021-01-10 NOTE — Consult Note (Signed)
NAME:  Steven [name], MRN:  [id number], DOB:  06/20/1956, LOS: 5 ADMISSION DATE:  01/05/2021, CONSULTATION DATE:  01/10/2021 REFERRING MD:  Garner Nash, DO, CHIEF COMPLAINT:  Uremia, renal failure  History of Present Illness:  Mr. Steven [name] is a 65 year old gentleman with a history of type 2 diabetes, chronic heart failure preserved ejection fraction, peripheral vascular disease status post left BKA, CKD stage IV of transplanted kidney, aortic stenosis status post TAVR in February 2022 who presents with worsening shortness of breath on 01/09/2021.  Despite successful TAVR in February 2022 he has had worsening shortness of breath and a CT chest showed bilateral pleural effusions and ascites.  He was referred to cardiothoracic surgery as an outpatient for bilateral Pleurx catheter placement in the beginning of June.  Has had thoracentesis large-volume and now large volume drainage from his Pleurx catheters.  He presented to the emergency department on June 7 for worsening shortness of breath.  He was found to have Enterococcus faecalis bacteremia.  His renal failure has progressively worsened and he is now exhibiting signs of uremia with encephalopathy.  They are called to see the patient by Dr. Marval Regal and Dr. Darrick Meigs for transfer to the ICU for continuous renal replacement therapy in the setting of encephalopathy and uremia.  This morning he is oriented to self but not to situation or time.  His wife is at the bedside and notes that he is very confused when she is not with him.  Currently denies any complaints other than some dyspnea.  He had anemia but hemoglobin of 6.9 this morning and was transfused 1 unit PRBC.  Pertinent  Medical History  Kidney transplant recipient, now with CKD stage IV Type 2 diabetes Severe aortic stenosis status post TAVR Peripheral vascular disease status post left BKA  Significant Hospital Events: Including procedures, antibiotic start and stop dates in addition to  other pertinent events   6/3Pleurx catheters placed 6/7 presented to [facility], ED worsening shortness of breath 6/8 blood cultures positive for Enterococcus faecalis 6/10 temporary HD catheter placed 6/11 worsening anasarca an encephalopathy, PCCM consulted  Interim History / Subjective:    Objective   Blood pressure 114/60, pulse 82, temperature (!) 96.9 F (36.1 C), temperature source Oral, resp. rate 15, height '5\' 11"'$  (1.803 m), weight 116.2 kg, SpO2 91 %.        Intake/Output Summary (Last 24 hours) at 01/10/2021 0948 Last data filed at 01/10/2021 0900 Gross per 24 hour  Intake 2510 ml  Output 4692 ml  Net -2182 ml   Filed Weights   01/08/21 0514 01/09/21 0500 01/10/21 0500  Weight: 118.2 kg 119.8 kg 116.2 kg    Examination: General: Diffuse anasarca, sitting up in bed HENT: Tracking appropriately, JVP present Lungs: Bilateral Pleurx catheters, breath sounds diminished in bases Cardiovascular: Regular rate rhythm, systolic murmur, diminished heart tones, S1-S2 Abdomen: Obese soft nontender nondistended Extremities: Left BKA, muscle wasting present Neuro: Moves all 4 extremities, follows commands alert oriented GU: Deferred catheter in place Lines: Right jugular HD cath  Labs/imaging that I havepersonally reviewed  (right click and "Reselect all SmartList Selections" daily)  Labs reviewed: Sodium 137, potassium 4.4, BUN 60, creatinine 4.2, magnesium 1.6 White blood cell count 2.5 Hemoglobin 7.9  Resolved Hospital Problem list     Assessment & Plan:  [name] with CKD stage IV on the transplanted kidney, type 2 diabetes with peripheral vascular disease and left BKA, aortic stenosis status post TAVR who presents with  Acute metabolic encephalopathy, secondary to uremia Stage IV CKD of transplanted kidney, now requiring renal placement therapy Plan: Continue time-limited trial of CVVHD. Appreciate nephrology input. Continue to monitor in the ICU on  continuous dialysis for any hemodynamic compromise or changes.  Enterococcus faecalis bacteremia Is on ampicillin and ceftriaxone, ID is following Plan: TEE next week Remains on ampicillin and ceftriaxone  Type II diabetes mellitus Plan: Lantus plus sliding scale Observe for any hypoglycemic events Titrate as needed  Hypertension Continue home BP meds Low threshold to stop while on CVVHD or if pressure changes. Discussed hold parameters  Acute on chronic anemia Likely secondary to worsening renal failure in setting of chronic disease Plan: Transfusion threshold for hemoglobin less than 7  Best practice (right click and "Reselect all SmartList Selections" daily)  Diet:  NPO Pain/Anxiety/Delirium protocol (if indicated): No VAP protocol (if indicated): Not indicated DVT prophylaxis: Subcutaneous Heparin GI prophylaxis: N/A Glucose control:  SSI Yes Central venous access:  Yes, and it is still needed Arterial line:  N/A Foley:  N/A Mobility:  bed rest  PT consulted: N/A Last date of multidisciplinary goals of care discussion [talk with patient's wife on 01/09/2021] Code Status:  full code Disposition: ICU  Labs   CBC: Recent Labs  Lab 01/05/21 1550 01/07/21 0527 01/07/21 1601 01/08/21 0311 01/09/21 0311 01/09/21 1255 01/09/21 1307 01/10/21 0403  WBC 4.9 1.6*  --  1.3* 1.2*  --  1.2* 2.5*  NEUTROABS 4.0  --   --   --  0.6*  --   --   --   HGB 8.7* 6.4*   < > 7.1* 6.9* 8.2* 7.7* 7.9*  HCT 26.7* 20.3*   < > 22.3* 21.7* 25.4* 25.0* 24.5*  MCV 80.9 82.9  --  84.5 83.1  --  86.2 81.9  PLT 105* 98*  --  90* 90*  --  100* 107*   < > = values in this interval not displayed.    Basic Metabolic Panel: Recent Labs  Lab 01/07/21 0527 01/08/21 0311 01/09/21 0311 01/09/21 1524 01/10/21 0403  NA 137 138 138 138 137  K 4.6 4.7 4.8 5.0 4.4  CL 102 105 103 105 104  CO2 21* 17* 21* 22 23  GLUCOSE 143* 107* 88 99 61*  BUN 80* 82* 85* 83* 60*  CREATININE 5.32* 5.34*  5.80* 5.62* 4.20*  CALCIUM 8.2* 8.2* 8.5* 8.2* 8.1*  MG  --   --   --   --  1.6*  PHOS 5.4* 5.8* 6.1* 5.3* 3.5   GFR: Estimated Creatinine Clearance: 23.3 mL/min (A) (by C-G formula based on SCr of 4.2 mg/dL (H)). Recent Labs  Lab 01/05/21 1636 01/05/21 1950 01/07/21 0527 01/08/21 0311 01/09/21 0311 01/09/21 1307 01/10/21 0403  WBC  --   --    < > 1.3* 1.2* 1.2* 2.5*  LATICACIDVEN 1.0 0.8  --   --   --   --   --    < > = values in this interval not displayed.    Liver Function Tests: Recent Labs  Lab 01/05/21 1550 01/07/21 0527 01/08/21 0311 01/09/21 0311 01/09/21 1524 01/10/21 0403  AST 18 21  --   --   --   --   ALT 12 15  --   --   --   --   ALKPHOS 129* 92  --   --   --   --   BILITOT 0.5 0.6  --   --   --   --  PROT 4.6* 4.9*  --   --   --   --   ALBUMIN 1.8* 2.5* 2.8* 2.9* 2.8* 2.8*   No results for input(s): LIPASE, AMYLASE in the last 168 hours. No results for input(s): AMMONIA in the last 168 hours.  ABG    Component Value Date/Time   PHART 7.461 (H) 09/04/2020 1442   PCO2ART 32.4 09/04/2020 1442   PO2ART 111 (H) 09/04/2020 1442   HCO3 22.8 09/04/2020 1442   TCO2 22 09/08/2020 1022   ACIDBASEDEF 0.6 09/04/2020 1442   O2SAT 98.7 09/04/2020 1442     Coagulation Profile: Recent Labs  Lab 01/05/21 1550  INR 1.2    Cardiac Enzymes: No results for input(s): CKTOTAL, CKMB, CKMBINDEX, TROPONINI in the last 168 hours.  HbA1C: Hemoglobin-A1c  Date/Time Value Ref Range Status  07/11/2007 03:04 PM  <6.0 % Final   5.9 (NOTE)  Therapeutic target for the treatment of diabetes mellitus patients is <7% HbA1c.  American Diabetes Association Diabetes Care 2002;25:S33-S49.   Hgb A1c MFr Bld  Date/Time Value Ref Range Status  12/31/2020 08:47 AM 5.2 4.8 - 5.6 % Final    Comment:    (NOTE)         Prediabetes: 5.7 - 6.4         Diabetes: >6.4         Glycemic control for adults with diabetes: <7.0   09/04/2020 02:11 PM 6.8 (H) 4.8 - 5.6 % Final     Comment:    (NOTE) Pre diabetes:          5.7%-6.4%  Diabetes:              >6.4%  Glycemic control for   <7.0% adults with diabetes     CBG: Recent Labs  Lab 01/10/21 0359 01/10/21 0440 01/10/21 0732 01/10/21 0802 01/10/21 0831  GLUCAP 58* 87 53* 75 98    Review of Systems:   Unable to obtain due to mental status  Past Medical History:  He,  has a past medical history of Bursitis, Cataract, Constipation due to opioid therapy, Diabetes ([place]), 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   Surgical History:   Past Surgical History:  Procedure Laterality Date   AMPUTATION Right 07/19/2017   Procedure: AMPUTATION RIGHT 2ND TOE;  Surgeon: Leandrew Koyanagi, MD;  Location: [facility];  Service: Orthopedics;  Laterality: Right;   AMPUTATION Left 06/03/2020   Procedure: LEFT BELOW KNEE AMPUTATION;  Surgeon: Newt Minion, MD;  Location: [facility];  Service: Orthopedics;  Laterality: Left;   CHEST TUBE INSERTION Bilateral 01/01/2021   Procedure: INSERTION PLEURAL DRAINAGE CATHETER;  Surgeon: Gaye Pollack, MD;  Location: [facility];  Service: Thoracic;  Laterality: Bilateral;   COLONOSCOPY     greater than 10 yrs ago   EYE SURGERY Bilateral    cataracts removed   INTRAOPERATIVE TRANSTHORACIC ECHOCARDIOGRAM N/A 09/08/2020   Procedure: INTRAOPERATIVE TRANSTHORACIC ECHOCARDIOGRAM;  Surgeon: Sherren Mocha, MD;  Location: [facility];  Service: Open Heart Surgery;  Laterality: N/A;   IR FLUORO GUIDE CV LINE RIGHT  01/08/2021   IR THORACENTESIS ASP PLEURAL SPACE W/IMG GUIDE  10/29/2020   IR THORACENTESIS ASP PLEURAL SPACE W/IMG GUIDE  12/11/2020   KIDNEY TRANSPLANT  2006   NEPHRECTOMY TRANSPLANTED ORGAN  2006   RIGHT HEART CATH AND CORONARY  ANGIOGRAPHY N/A 06/08/2020  Procedure: RIGHT HEART CATH AND CORONARY ANGIOGRAPHY;  Surgeon: Nelva Bush, MD;  Location: [facility];  Service: Cardiovascular;  Laterality: N/A;   TOOTH EXTRACTION N/A 08/06/2020   Procedure: DENTAL EXTRACTIONS;  Surgeon: [name], DMD;  Location: [facility];  Service: Dentistry;  Laterality: N/A;   TRANSCATHETER AORTIC VALVE REPLACEMENT, TRANSFEMORAL Left 09/08/2020   Procedure: TRANSCATHETER AORTIC VALVE REPLACEMENT, LEFT TRANSFEMORAL;  Surgeon: Sherren Mocha, MD;  Location: [facility];  Service: Open Heart Surgery;  Laterality: Left;   UPPER GASTROINTESTINAL ENDOSCOPY     WISDOM TOOTH EXTRACTION       Social History:   reports that he has never smoked. He quit smokeless tobacco use about 2 years ago.  His smokeless tobacco use included chew. He reports that he does not drink alcohol and does not use drugs.   Family History:  His family history includes Healthy in his father and mother. There is no history of Colon cancer, Rectal cancer, or Stomach cancer.   Allergies Allergies  Allergen Reactions   Sulfa Antibiotics Rash     Home Medications  Prior to Admission medications   Medication Sig Start Date End Date Taking? Authorizing Provider  acetaminophen (TYLENOL) 500 MG tablet Take 1,000 mg by mouth every 6 (six) hours as needed for moderate pain.   Yes [provider]  aspirin EC 81 MG EC tablet Take 1 tablet (81 mg total) by mouth daily. Swallow whole. 06/24/20  Yes Love, Ivan Anchors, PA-C  atorvastatin (LIPITOR) 10 MG tablet Take 1 tablet (10 mg total) by mouth daily. 06/10/20  Yes Oretha Milch D, MD  clopidogrel (PLAVIX) 75 MG tablet TAKE 1 TABLET (75 MG TOTAL) BY MOUTH DAILY WITH BREAKFAST. Patient taking differently: Take 75 mg by mouth at bedtime. 09/10/20 09/10/21 Yes Eileen Stanford, PA-C  cyclobenzaprine (FLEXERIL) 5 MG tablet Take 1 tablet (5 mg total) by mouth 3 (three) times daily as needed for muscle spasms. 06/23/20   Yes Love, Ivan Anchors, PA-C  docusate sodium (COLACE) 100 MG capsule Take 1 capsule (100 mg total) by mouth 2 (two) times daily. Patient taking differently: Take 100 mg by mouth daily. 06/23/20  Yes Love, Ivan Anchors, PA-C  fenofibrate (TRICOR) 48 MG tablet Take 1 tablet (48 mg total) by mouth daily. 06/10/20  Yes Oretha Milch D, MD  furosemide (LASIX) 20 MG tablet Take 1 tablet (20 mg total) by mouth daily. 10/16/20 10/11/21 Yes Eileen Stanford, PA-C  glyBURIDE (DIABETA) 5 MG tablet Take 1 tablet (5 mg total) by mouth 2 (two) times daily with a meal. 06/10/20  Yes Oretha Milch D, MD  hydrALAZINE (APRESOLINE) 50 MG tablet Take 1 tablet (50 mg total) by mouth 3 (three) times daily. 10/05/20  Yes Theora Gianotti, NP  insulin aspart (NOVOLOG) 100 UNIT/ML injection Inject 0-30 Units into the skin 3 (three) times daily as needed for high blood sugar. Sliding scale 06/10/20  Yes Oretha Milch D, MD  insulin detemir (LEVEMIR) 100 UNIT/ML FlexPen Inject 10 Units into the skin daily. 06/10/20  Yes Oretha Milch D, MD  mycophenolate (MYFORTIC) 360 MG TBEC EC tablet Take 1 tablet (360 mg total) by mouth 2 (two) times daily. 06/10/20  Yes Oretha Milch D, MD  pantoprazole (PROTONIX) 40 MG tablet TAKE 1 TABLET (40 MG TOTAL) BY MOUTH DAILY. Patient taking differently: Take 40 mg by mouth daily. 09/10/20 09/10/21 Yes Eileen Stanford, PA-C  polyethylene glycol (MIRALAX / GLYCOLAX) 17 g packet Take 17 g by mouth daily  as needed for mild constipation. 06/10/20  Yes Oretha Milch D, MD  tacrolimus (PROGRAF) 1 MG capsule Take 1 capsule (1 mg total) by mouth 2 (two) times daily. 06/10/20  Yes Oretha Milch D, MD  traMADol (ULTRAM) 50 MG tablet Take 1 tablet (50 mg total) by mouth every 12 (twelve) hours as needed for moderate pain. 07/29/20  Yes Persons, Bevely Palmer, PA  traZODone (DESYREL) 50 MG tablet Take 1 tablet (50 mg total) by mouth at bedtime. Patient taking differently: Take 50 mg by mouth at  bedtime as needed for sleep. 06/23/20  Yes Love, Ivan Anchors, PA-C  acetaminophen-codeine (TYLENOL #3) 300-30 MG tablet Take 1 tablet by mouth every 8 (eight) hours as needed for moderate pain. Patient not taking: Reported on 12/30/2020 07/29/20   Persons, Bevely Palmer, [place]  Insulin Pen Needle (BD PEN NEEDLE NANO 2ND GEN) 32G X 4 MM MISC Use daily for glucose control 06/10/20   Oretha Milch D, MD  Insulin Pen Needle 32G X 4 MM MISC Use as directed with insulin pen 06/10/20   Desiree Hane, MD  [facility] VERIO test strip 1 each by Other route 3 (three) times daily. 06/10/20   Oretha Milch D, MD     This patient is critically ill with multiple organ system failure; which, requires frequent high complexity decision making, assessment, support, evaluation, and titration of therapies. This was completed through the application of advanced monitoring technologies and extensive interpretation of multiple databases. During this encounter critical care time was devoted to patient care services described in this note for 32 minutes.  Garner Nash, DO [facility] 01/10/2021 9:48 AM

## 2021-01-11 ENCOUNTER — Encounter (HOSPITAL_COMMUNITY): Payer: Self-pay

## 2021-01-11 DIAGNOSIS — L8990 Pressure ulcer of unspecified site, unspecified stage: Secondary | ICD-10-CM | POA: Insufficient documentation

## 2021-01-11 DIAGNOSIS — I509 Heart failure, unspecified: Secondary | ICD-10-CM

## 2021-01-11 LAB — RENAL FUNCTION PANEL
Albumin: 2.6 g/dL — ABNORMAL LOW (ref 3.5–5.0)
Albumin: 2.7 g/dL — ABNORMAL LOW (ref 3.5–5.0)
Anion gap: 6 (ref 5–15)
Anion gap: 10 (ref 5–15)
BUN: 36 mg/dL — ABNORMAL HIGH (ref 8–23)
BUN: 32 mg/dL — ABNORMAL HIGH (ref 8–23)
CO2: 27 mmol/L (ref 22–32)
CO2: 28 mmol/L (ref 22–32)
Calcium: 8.1 mg/dL — ABNORMAL LOW (ref 8.9–10.3)
Calcium: 8.3 mg/dL — ABNORMAL LOW (ref 8.9–10.3)
Chloride: 104 mmol/L (ref 98–111)
Chloride: 98 mmol/L (ref 98–111)
Creatinine, Ser: 2.75 mg/dL — ABNORMAL HIGH (ref 0.61–1.24)
Creatinine, Ser: 2.43 mg/dL — ABNORMAL HIGH (ref 0.61–1.24)
GFR, Estimated: 25 mL/min — ABNORMAL LOW (ref >60)
GFR, Estimated: 29 mL/min — ABNORMAL LOW (ref >60)
Glucose, Bld: 136 mg/dL — ABNORMAL HIGH (ref 70–99)
Glucose, Bld: 287 mg/dL — ABNORMAL HIGH (ref 70–99)
Phosphorus: 2.5 mg/dL (ref 2.5–4.6)
Phosphorus: 2.2 mg/dL — ABNORMAL LOW (ref 2.5–4.6)
Potassium: 4.7 mmol/L (ref 3.5–5.1)
Potassium: 5.4 mmol/L — ABNORMAL HIGH (ref 3.5–5.1)
Sodium: 137 mmol/L (ref 135–145)
Sodium: 136 mmol/L (ref 135–145)

## 2021-01-11 LAB — GLUCOSE, CAPILLARY
Glucose-Capillary: 135 mg/dL — ABNORMAL HIGH (ref 70–99)
Glucose-Capillary: 143 mg/dL — ABNORMAL HIGH (ref 70–99)
Glucose-Capillary: 184 mg/dL — ABNORMAL HIGH (ref 70–99)
Glucose-Capillary: 232 mg/dL — ABNORMAL HIGH (ref 70–99)
Glucose-Capillary: 237 mg/dL — ABNORMAL HIGH (ref 70–99)
Glucose-Capillary: 256 mg/dL — ABNORMAL HIGH (ref 70–99)
Glucose-Capillary: 267 mg/dL — ABNORMAL HIGH (ref 70–99)
Glucose-Capillary: 272 mg/dL — ABNORMAL HIGH (ref 70–99)

## 2021-01-11 LAB — BASIC METABOLIC PANEL
Anion gap: 7 (ref 5–15)
BUN: 30 mg/dL — ABNORMAL HIGH (ref 8–23)
CO2: 29 mmol/L (ref 22–32)
Calcium: 8 mg/dL — ABNORMAL LOW (ref 8.9–10.3)
Chloride: 99 mmol/L (ref 98–111)
Creatinine, Ser: 2.41 mg/dL — ABNORMAL HIGH (ref 0.61–1.24)
GFR, Estimated: 29 mL/min — ABNORMAL LOW (ref >60)
Glucose, Bld: 256 mg/dL — ABNORMAL HIGH (ref 70–99)
Potassium: 5.5 mmol/L — ABNORMAL HIGH (ref 3.5–5.1)
Sodium: 135 mmol/L (ref 135–145)

## 2021-01-11 LAB — PROTEIN ELECTROPHORESIS, SERUM
A/G Ratio: 1.8 — ABNORMAL HIGH (ref 0.7–1.7)
Albumin ELP: 3.1 g/dL (ref 2.9–4.4)
Alpha-1-Globulin: 0.2 g/dL (ref 0.0–0.4)
Alpha-2-Globulin: 0.6 g/dL (ref 0.4–1.0)
Beta Globulin: 0.5 g/dL — ABNORMAL LOW (ref 0.7–1.3)
Gamma Globulin: 0.4 g/dL (ref 0.4–1.8)
Globulin, Total: 1.7 g/dL — ABNORMAL LOW (ref 2.2–3.9)
Total Protein ELP: 4.8 g/dL — ABNORMAL LOW (ref 6.0–8.5)

## 2021-01-11 LAB — CBC
HCT: 23.8 % — ABNORMAL LOW (ref 39.0–52.0)
Hemoglobin: 7.5 g/dL — ABNORMAL LOW (ref 13.0–17.0)
MCH: 26.6 pg (ref 26.0–34.0)
MCHC: 31.5 g/dL (ref 30.0–36.0)
MCV: 84.4 fL (ref 80.0–100.0)
Platelets: 90 10*3/uL — ABNORMAL LOW (ref 150–400)
RBC: 2.82 MIL/uL — ABNORMAL LOW (ref 4.22–5.81)
RDW: 16.4 % — ABNORMAL HIGH (ref 11.5–15.5)
WBC: 3.3 10*3/uL — ABNORMAL LOW (ref 4.0–10.5)
nRBC: 0 % (ref 0.0–0.2)

## 2021-01-11 LAB — KAPPA/LAMBDA LIGHT CHAINS
Kappa free light chain: 90.5 mg/L — ABNORMAL HIGH (ref 3.3–19.4)
Kappa, lambda light chain ratio: 1.05 (ref 0.26–1.65)
Lambda free light chains: 86.2 mg/L — ABNORMAL HIGH (ref 5.7–26.3)

## 2021-01-11 LAB — MAGNESIUM: Magnesium: 2.1 mg/dL (ref 1.7–2.4)

## 2021-01-11 LAB — IMMUNOFIXATION, URINE

## 2021-01-11 LAB — TACROLIMUS LEVEL: Tacrolimus (FK506) - LabCorp: 5.9 ng/mL (ref 2.0–20.0)

## 2021-01-11 MED ORDER — PRISMASOL BGK 0/2.5 32-2.5 MEQ/L EC SOLN
Status: DC
  Filled 2021-01-11 (×4): qty 5000

## 2021-01-11 MED ORDER — INSULIN ASPART 100 UNIT/ML IJ SOLN
0.0000 [IU] | Freq: Every day | INTRAMUSCULAR | Status: DC
  Administered 2021-01-11 – 2021-01-12 (×2): 2 [IU] via SUBCUTANEOUS

## 2021-01-11 MED ORDER — PRISMASOL BGK 0/2.5 32-2.5 MEQ/L EC SOLN
Status: DC
  Filled 2021-01-11 (×5): qty 5000

## 2021-01-11 MED ORDER — INSULIN ASPART 100 UNIT/ML IJ SOLN: 2.0000 [IU] | INTRAMUSCULAR | Status: DC

## 2021-01-11 MED ORDER — PRISMASOL BGK 0/2.5 32-2.5 MEQ/L EC SOLN
Status: DC
  Filled 2021-01-11 (×28): qty 5000

## 2021-01-11 MED ORDER — CHLORHEXIDINE GLUCONATE CLOTH 2 % EX PADS
6.0000 | MEDICATED_PAD | Freq: Every day | CUTANEOUS | Status: DC
  Administered 2021-01-11 – 2021-01-14 (×4): 6 via TOPICAL

## 2021-01-11 MED ORDER — INSULIN GLARGINE 100 UNIT/ML ~~LOC~~ SOLN
10.0000 [IU] | Freq: Every day | SUBCUTANEOUS | Status: DC
  Administered 2021-01-11 – 2021-01-13 (×3): 10 [IU] via SUBCUTANEOUS
  Filled 2021-01-11 (×4): qty 0.1

## 2021-01-11 MED ORDER — INSULIN ASPART 100 UNIT/ML IJ SOLN
0.0000 [IU] | Freq: Three times a day (TID) | INTRAMUSCULAR | Status: DC
  Administered 2021-01-12 – 2021-01-13 (×4): 3 [IU] via SUBCUTANEOUS
  Administered 2021-01-14: 2 [IU] via SUBCUTANEOUS

## 2021-01-11 MED ORDER — INSULIN DETEMIR 100 UNIT/ML ~~LOC~~ SOLN: 8.0000 [IU] | Freq: Two times a day (BID) | SUBCUTANEOUS | Status: DC

## 2021-01-11 NOTE — Progress Notes (Signed)
[facility] Progress Note   Assessment/ Plan:   ESRD s/p LRD kidney transplant 15 years ago complicated by AKI/CKD stage IIIb - in setting of decompensated CHF.  [place] with some hydro to transplanted kidney.  Possibly cardiorenal syndrome (supported by low UNa), but also concerning for nephrotic syndrome.   Consulted IR for temp HD cath placed 01/08/21 but unable to go to HD because of hypoglycemia and AMS. Transferred to ICU and started on CRRT 01/09/21 due to asterixis, AMS, low BP and massive volume overload All fluids 4K/2.5Ca, dialysate at 1500 ml/hr, pre-filter and post-filter 300 ml/hr UF 100-200 ml/hr, no heparin due to thrombocytopenia. Will UF as tolerated, continue aggressive volume removal. Nephrotic range proteinuria 6 g/ 24 hr--> SPEP and serum free light chains pending.   Acute hypoxic respiratory failure - secondary to recurrent pleural effusions. Acute on chronic CHF - ECHO in 3/22 showed preserved EF with grade II DD and mildly increased gradient across TAVR.   Not responding to high dose IV lasix. Repeat ECHO with EF 60-65%, mod LVH, Grade II DD, normal RV function 24 hour urine protein with 6 grams/24 hours but normal lipids. UF with CRRT as above.   Immunosuppression - continue with home dose of prograf Myfortic held due to neutropenia Enterococcal Faecalis bacteremia - ID following and managing abx, ampicillin and ceftriaxone Pancytopenia - Myfortic on hold.  WBC 1.2--> 2.5.  pending today Anemia of CKD and iron deficiency - low TSAT will order IV feraheme, transfuse prbcs as needed Recurrent bilateral pleural effusions - s/p bilateral PleurX catheters on 01/01/21.  Drain per primary svc. HTN - stable DM - per primary Thrombocytopenia - chronic issue  Subjective:    Seen in room.  Continues on CRRT.  Some but not a lot of urine output.     Objective:   BP (!) 112/49   Pulse 80   Temp (!) 97.5 F (36.4 C) (Axillary)   Resp 18   Ht '5\' 11"'$  (1.803 m)    Wt 107.6 kg   SpO2 95%   BMI 33.08 kg/m   Physical Exam: Gen: lying in bed, awake and conversant CVS: tachycardic Resp: muffled breath sounds bilaterally Abd: some abd wall edema Ext: 2+ RLE edema, s/p BKA on the L with some edema as well ACCESS: R IJ nontunneled HD cath  Labs: BMET Recent Labs  Lab 01/07/21 0527 01/08/21 0311 01/09/21 0311 01/09/21 1524 01/10/21 0403 01/10/21 1537 01/11/21 0352  NA 137 138 138 138 137 136 137  K 4.6 4.7 4.8 5.0 4.4 5.2* 4.7  CL 102 105 103 105 104 103 104  CO2 21* 17* 21* '22 23 26 27  '$ GLUCOSE 143* 107* 88 99 61* 222* 136*  BUN 80* 82* 85* 83* 60* 49* 36*  CREATININE 5.32* 5.34* 5.80* 5.62* 4.20* 3.45* 2.75*  CALCIUM 8.2* 8.2* 8.5* 8.2* 8.1* 7.8* 8.1*  PHOS 5.4* 5.8* 6.1* 5.3* 3.5 3.1 2.5   CBC Recent Labs  Lab 01/05/21 1550 01/07/21 0527 01/08/21 0311 01/09/21 0311 01/09/21 1255 01/09/21 1307 01/10/21 0403  WBC 4.9   < > 1.3* 1.2*  --  1.2* 2.5*  NEUTROABS 4.0  --   --  0.6*  --   --   --   HGB 8.7*   < > 7.1* 6.9* 8.2* 7.7* 7.9*  HCT 26.7*   < > 22.3* 21.7* 25.4* 25.0* 24.5*  MCV 80.9   < > 84.5 83.1  --  86.2 81.9  PLT 105*   < >  90* 90*  --  100* 107*   < > = values in this interval not displayed.      Medications:     aspirin  81 mg Oral Daily   atorvastatin  10 mg Oral Daily   carvedilol  3.125 mg Oral BID WC   Chlorhexidine Gluconate Cloth  6 each Topical Daily   clopidogrel  75 mg Oral Daily   docusate sodium  100 mg Oral Daily   feeding supplement  237 mL Oral TID BM   fenofibrate  160 mg Oral Daily   insulin aspart  0-6 Units Subcutaneous Q4H   multivitamin with minerals  1 tablet Oral Daily   pantoprazole  40 mg Oral Daily   sodium chloride flush  3 mL Intravenous Q12H   tacrolimus  1 mg Oral BID     Madelon Lips, MD 01/11/2021, 9:03 AM

## 2021-01-11 NOTE — Progress Notes (Addendum)
Physical Therapy Treatment Patient Details Name: Steven [name] MRN: [id number] DOB: 21-May-1956 Today's Date: 01/11/2021    History of Present Illness Pt is a 65 y.o. male presenting on 01/05/2021 with SOB and low-grade fever. Pt with pulmonary edema and pleural effusion R>L. 6/11 CRRT started. PMHx: chronic diastolic CHF, recurrent bilateral pleural effusion transudate s/[name] Pleurx , CKD IV of transplanted kidney, s/[name] TAVR (09/2020), HTN, IDDM, PVD s/[name] L BKA (06/2020).    PT Comments    Pt on CRRT with decreased awareness of deficits and function with assist to stand, transfer to chair and perform HEP. PT with reliance on supplemental O2 as sats dropped to 88% on RA and 95% on 2L with activity. Pt educated for transfers, need for lateral movement at this time due edema and unable to don prosthetic. Will continue to follow.     Follow Up Recommendations  SNF     Equipment Recommendations  None recommended by PT    Recommendations for Other Services       Precautions / Restrictions Precautions Precautions: Fall;Other (comment) Precaution Comments: H/o L BKA (prosthetic in room), watch sats, RIJ CRRT Restrictions Weight Bearing Restrictions: No    Mobility  Bed Mobility Overal bed mobility: Needs Assistance Bed Mobility: Supine to Sit     Supine to sit: Min assist;HOB elevated     General bed mobility comments: HOB 35 degrees with cues for sequence, use of rail and physical assist to lift trunk    Transfers Overall transfer level: Needs assistance   Transfers: Sit to/from Stand;Lateral/Scoot Transfers Sit to Stand: Mod assist;+2 physical assistance        Lateral/Scoot Transfers: Mod assist;+2 physical assistance General transfer comment: mod +2 with right knee blocked and assist to rise onto RLE to stand with RW grossly 1 min. Pt with continued partial buckling of RLE in standing with max cues to push through bil UE to support trunk in standing. Pt then sat for lateral  scoot with drop arm from bed to chair toward pt left with max cues and assist of pad to scoot  Ambulation/Gait             General Gait Details: not currently able   Stairs             Wheelchair Mobility    Modified Rankin (Stroke Patients Only)       Balance Overall balance assessment: Needs assistance Sitting-balance support: Bilateral upper extremity supported;Feet supported Sitting balance-Leahy Scale: Fair Sitting balance - Comments: EOB with supervision for lines and safety                                    Cognition Arousal/Alertness: Awake/alert Behavior During Therapy: WFL for tasks assessed/performed Overall Cognitive Status: Impaired/Different from baseline Area of Impairment: Orientation;Memory;Safety/judgement;Problem solving                 Orientation Level: Time;Situation   Memory: Decreased short-term memory   Safety/Judgement: Decreased awareness of deficits;Decreased awareness of safety   Problem Solving: Slow processing General Comments: pt needing assist to don prosthesis however limb still too swollen for prosthesis to fit. slow processing      Exercises General Exercises - Lower Extremity Long Arc Quad: AROM;Both;Seated;10 reps Hip Flexion/Marching: AROM;Both;Seated;10 reps    General Comments        Pertinent Vitals/Pain Pain Assessment: No/denies pain    Home Living  Prior Function            PT Goals (current goals can now be found in the care plan section) Acute Rehab PT Goals Time For Goal Achievement: 01/25/21 Potential to Achieve Goals: Good Progress towards PT goals: Goals downgraded-see care plan    Frequency    Min 3X/week      PT Plan Current plan remains appropriate    Co-evaluation              AM-PAC PT "6 Clicks" Mobility   Outcome Measure  Help needed turning from your back to your side while in a flat bed without using bedrails?: A  Little Help needed moving from lying on your back to sitting on the side of a flat bed without using bedrails?: A Lot Help needed moving to and from a bed to a chair (including a wheelchair)?: Total Help needed standing up from a chair using your arms (e.g., wheelchair or bedside chair)?: Total Help needed to walk in hospital room?: Total Help needed climbing 3-5 steps with a railing? : Total 6 Click Score: 9    End of Session Equipment Utilized During Treatment: Gait belt;Oxygen Activity Tolerance: Patient tolerated treatment well Patient left: in chair;with call bell/phone within reach;with chair alarm set;with nursing/sitter in room Nurse Communication: Mobility status;Other (comment) (lateral transfer back to bed) PT Visit Diagnosis: Other abnormalities of gait and mobility (R26.89);Muscle weakness (generalized) (M62.81);Difficulty in walking, not elsewhere classified (R26.2)     Time: [id number] PT Time Calculation (min) (ACUTE ONLY): 23 min  Charges:  $Therapeutic Exercise: 8-22 mins $Therapeutic Activity: 8-22 mins                     Steven [name], PT Acute Rehabilitation Services Pager: [phone] Office: [phone]    Christian Borgerding B Arlee Bossard 01/11/2021, 9:27 AM

## 2021-01-11 NOTE — Progress Notes (Addendum)
NAME:  COTY KACZMARCZYK, MRN:  [id number], DOB:  1955/08/06, LOS: 6 ADMISSION DATE:  01/05/2021, CONSULTATION DATE:  01/11/2021 REFERRING MD:  Garner Nash, DO, CHIEF COMPLAINT:  Uremia, renal failure  History of Present Illness:  [name] is a 65 year old gentleman with a history of type 2 diabetes, chronic heart failure preserved ejection fraction, peripheral vascular disease status post left BKA, CKD stage IV of transplanted kidney, aortic stenosis status post TAVR in February 2022 who presents with worsening shortness of breath on 01/09/2021.  Despite successful TAVR in February 2022 he has had worsening shortness of breath and a CT chest showed bilateral pleural effusions and ascites.  He was referred to cardiothoracic surgery as an outpatient for bilateral Pleurx catheter placement in the beginning of June.  Has had thoracentesis large-volume and now large volume drainage from his Pleurx catheters.  He presented to the emergency department on June 7 for worsening shortness of breath.  He was found to have Enterococcus faecalis bacteremia.  His renal failure has progressively worsened and he is now exhibiting signs of uremia with encephalopathy.  They are called to see the patient by Dr. Marval Regal and Dr. Darrick Meigs for transfer to the ICU for continuous renal replacement therapy in the setting of encephalopathy and uremia.   Pertinent  Medical History  Kidney transplant recipient, now with CKD stage IV Type 2 diabetes Severe aortic stenosis status post TAVR Peripheral vascular disease status post left BKA  Significant Hospital Events: Including procedures, antibiotic start and stop dates in addition to other pertinent events   6/3Pleurx catheters placed 6/7 presented to [facility], ED worsening shortness of breath 6/8 blood cultures positive for Enterococcus faecalis 6/10 temporary HD catheter placed 6/11 worsening anasarca an encephalopathy, PCCM consulted 6/13 On CRRT; alert and  oriented  Interim History / Subjective:   On 2 l/m [place]; wore cpap last night CRRT running Sitting in chair eating breakfast; alert and oriented 1550 out of both pleur x catheters  Objective   Blood pressure 140/68, pulse 78, temperature (!) 97.5 F (36.4 C), temperature source Axillary, resp. rate 20, height '5\' 11"'$  (1.803 m), weight 107.6 kg, SpO2 93 %.        Intake/Output Summary (Last 24 hours) at 01/11/2021 0727 Last data filed at 01/11/2021 0700 Gross per 24 hour  Intake 1910.24 ml  Output 7838 ml  Net -5927.76 ml    Filed Weights   01/09/21 0500 01/10/21 0500 01/11/21 0500  Weight: 119.8 kg 116.2 kg 107.6 kg    Examination: General:  NAD; pale appearing; sitting in chair HEENT: MM pink/moist; [place] in place; R IJ HD cath Neuro: Aox3; moves all 4 extremeties CV: s1s2, RRR PULM:  Dim clear BS bilaterally; Bilateral pleurx catheters in place 1550 output last 24 hours GI: soft, bsx4 active; non tender Extremities: L BKA, warm/dry, no edema  Skin: no rashes or lesions  Labs/imaging that I havepersonally reviewed  (right click and "Reselect all SmartList Selections" daily)  BUN 36, creat 2.75 (improving) K 4.7, mag 2.1   Resolved Hospital Problem list     Assessment & Plan:  [name] with CKD stage IV on the transplanted kidney, type 2 diabetes with peripheral vascular disease and left BKA, aortic stenosis status post TAVR who presents with  Acute metabolic encephalopathy, secondary to uremia Stage IV CKD of transplanted kidney, now requiring renal placement therapy Plan: -Continue CVVHD per nephrology -Trend BMP/UO  Enterococcus faecalis bacteremia Is on ampicillin and ceftriaxone, ID is  following Plan: -ID following -TEE sometime this week -Continue ampicillin and ceftriaxone  Type II diabetes mellitus Plan: -SSI and CBG monitoring -Lantus on hold due to hypoglycemia on 6/12; consider restarting tomorrow now that patient is eating  breakfast  Hypertension Plan: -continue home BP meds  Acute on chronic anemia Pancytopenia: on Tacrolimus for kidney transplant Likely secondary to worsening renal failure in setting of chronic disease Plan: -trend CBC -Transfuse for hgb <7  Hx of OSA?: patient states he wears CPAP at bedtime at home Plan: -CPAP QHS   Best practice (right click and "Reselect all SmartList Selections" daily)  Diet:  Oral Pain/Anxiety/Delirium protocol (if indicated): No VAP protocol (if indicated): Not indicated DVT prophylaxis: Subcutaneous Heparin GI prophylaxis: PPI Glucose control:  SSI Yes Central venous access:  Yes, and it is still needed Arterial line:  N/A Foley:  N/A Mobility:  OOB  PT consulted: N/A Last date of multidisciplinary goals of care discussion [6/13: spoke with patient and his parents at bedside] Code Status:  full code Disposition: ICU   This patient is critically ill with multiple organ system failure; which, requires frequent high complexity decision making, assessment, support, evaluation, and titration of therapies. This was completed through the application of advanced monitoring technologies and extensive interpretation of multiple databases. During this encounter critical care time was devoted to patient care services described in this note for 35 minutes.  JD Rexene Agent [facility] 01/11/2021, 9:10 AM  Please see [email or web address] for pager details.  From 7A-7P if no response, please call [phone]. After hours, please call ELink [phone].

## 2021-01-11 NOTE — Progress Notes (Signed)
eLink Physician-Brief Progress Note Patient Name: Steven [name] DOB: [birth year] MRN: [id number]   Date of Service  01/11/2021  HPI/Events of Note  Suboptimal glycemic control on low dose insulin sliding scale only. Patient is now eating.   eICU Interventions  Changed regimen to Lantus 10u [place] QHS + medium-intensity sliding scale QACHS.     Intervention Category Intermediate Interventions: Hyperglycemia - evaluation and treatment;Electrolyte abnormality - evaluation and management  Charlott Rakes 01/11/2021, 7:37 PM

## 2021-01-11 NOTE — Progress Notes (Signed)
Inpatient Diabetes Program Recommendations  AACE/ADA: New Consensus Statement on Inpatient Glycemic Control (2015)  Target Ranges:  Prepandial:   less than 140 mg/dL      Peak postprandial:   less than 180 mg/dL (1-2 hours)      Critically ill patients:  140 - 180 mg/dL   Lab Results  Component Value Date   GLUCAP 237 (H) 01/11/2021   HGBA1C 5.2 12/31/2020    Review of Glycemic Control Results for Steven [name], Steven [name]" (MRN [id number]) as of 01/11/2021 12:37  Ref. Range 01/11/2021 03:56 01/11/2021 07:12 01/11/2021 11:16  Glucose-Capillary Latest Ref Range: 70 - 99 mg/dL 135 (H) 143 (H) 237 (H)   Diabetes history: Type 2 DM Outpatient Diabetes medications: Glyburide 5 mg BID, Novolog 0-20 units SSI, Levemir 10 units QD Current orders for Inpatient glycemic control: Novolog 0-6 units Q4H  Inpatient Diabetes Program Recommendations:    Consider changing correction to Novolog 0-6 units TID.   Thanks, Bronson Curb, MSN, RNC-OB Diabetes Coordinator [phone] (8a-5p)

## 2021-01-11 NOTE — Progress Notes (Signed)
[facility] for Infectious Disease  Date of Admission:  01/05/2021   Total days of antibiotics 7         ASSESSMENT: Patient was transferred to the ICU over the weekend due to uremia requiring CRRT.  His mental status is improving , mostly back to baseline.  He is afebrile overnight.  He had a HD catheter placed at right IJ.  Pending TEE which will determine the duration of antibiotics.  Repeat blood culture negative to date.  PLAN: Continue ampicillin and ceftriaxone for Enterococcus bacteremia Waiting for TEE  Active Problems:   AKI (acute kidney injury) ([facility])   Acute on chronic diastolic CHF (congestive heart failure) (HCC)   CHF (congestive heart failure) (HCC)   Pressure injury of skin   Scheduled Meds:  aspirin  81 mg Oral Daily   atorvastatin  10 mg Oral Daily   carvedilol  3.125 mg Oral BID WC   Chlorhexidine Gluconate Cloth  6 each Topical Daily   clopidogrel  75 mg Oral Daily   docusate sodium  100 mg Oral Daily   feeding supplement  237 mL Oral TID BM   fenofibrate  160 mg Oral Daily   insulin aspart  0-6 Units Subcutaneous Q4H   multivitamin with minerals  1 tablet Oral Daily   pantoprazole  40 mg Oral Daily   sodium chloride flush  3 mL Intravenous Q12H   tacrolimus  1 mg Oral BID   Continuous Infusions:   prismasol BGK 4/2.5 300 mL/hr at 01/10/21 2339    prismasol BGK 4/2.5 300 mL/hr at 01/10/21 2338   sodium chloride 5 mL/hr at 01/11/21 1300   ampicillin (OMNIPEN) IV Stopped (01/11/21 0858)   cefTRIAXone (ROCEPHIN)  IV Stopped (01/11/21 0956)   prismasol BGK 4/2.5 1,500 mL/hr at 01/11/21 1256   PRN Meds:.sodium chloride, acetaminophen, acetaminophen, cyclobenzaprine, heparin, labetalol, lidocaine, ondansetron (ZOFRAN) IV, sodium chloride, sodium chloride flush, traMADol, traZODone   SUBJECTIVE: Patient is seen at bedside.  He said that he is feeling fine and almost back to baseline.  RN states that he still slightly confused.  Patient denies  any complaints.  Review of Systems: ROS Per HPI   Allergies  Allergen Reactions   Sulfa Antibiotics Rash    OBJECTIVE: Vitals:   01/11/21 0919 01/11/21 0930 01/11/21 1000 01/11/21 1030  BP:  (!) 103/57 112/62 (!) 112/51  Pulse:  77 75 76  Resp:  (!) 30 (!) 21 (!) 21  Temp:      TempSrc:      SpO2: 95% 98% 99% 100%  Weight:      Height:       Body mass index is 33.08 kg/m.  Physical Exam Constitutional:      General: He is not in acute distress.    Appearance: He is not toxic-appearing.     Comments: Patient is alert and awake.  Able to hold conversation appropriately.  HENT:     Head: Normocephalic.  Eyes:     General: No scleral icterus.       Right eye: No discharge.        Left eye: No discharge.     Conjunctiva/sclera: Conjunctivae normal.  Cardiovascular:     Rate and Rhythm: Normal rate and regular rhythm.     Heart sounds: Murmur (2/6 systolic murmur) heard.  Skin:    General: Skin is warm.     Coloration: Skin is not jaundiced.  Neurological:  Mental Status: He is alert. Mental status is at baseline.    Lab Results Lab Results  Component Value Date   WBC 3.3 (L) 01/11/2021   HGB 7.5 (L) 01/11/2021   HCT 23.8 (L) 01/11/2021   MCV 84.4 01/11/2021   PLT 90 (L) 01/11/2021    Lab Results  Component Value Date   CREATININE 2.75 (H) 01/11/2021   BUN 36 (H) 01/11/2021   NA 137 01/11/2021   K 4.7 01/11/2021   CL 104 01/11/2021   CO2 27 01/11/2021    Lab Results  Component Value Date   ALT 15 01/07/2021   AST 21 01/07/2021   ALKPHOS 92 01/07/2021   BILITOT 0.6 01/07/2021     Microbiology: Recent Results (from the past 240 hour(s))  Culture, blood (x 2)     Status: Abnormal   Collection Time: 01/05/21  4:36 PM   Specimen: BLOOD LEFT FOREARM  Result Value Ref Range Status   Specimen Description BLOOD LEFT FOREARM  Final   Special Requests   Final    BOTTLES DRAWN AEROBIC AND ANAEROBIC Blood Culture adequate volume   Culture  Setup  Time   Final    GRAM POSITIVE COCCI IN BOTH AEROBIC AND ANAEROBIC BOTTLES CRITICAL RESULT CALLED TO, READ BACK BY AND VERIFIED WITH: PHARMD JEREMY FRENS AT [id number] [place] ON 01/06/21 BY KJ Performed at [facility], [place] [address], [place], [place] [address]    Culture ENTEROCOCCUS FAECALIS (A)  Final   Report Status 01/08/2021 FINAL  Final   Organism ID, Bacteria ENTEROCOCCUS FAECALIS  Final      Susceptibility   Enterococcus faecalis - MIC*    AMPICILLIN <=2 SENSITIVE Sensitive     VANCOMYCIN 1 SENSITIVE Sensitive     GENTAMICIN SYNERGY SENSITIVE Sensitive     * ENTEROCOCCUS FAECALIS  Blood Culture ID Panel (Reflexed)     Status: Abnormal   Collection Time: 01/05/21  4:36 PM  Result Value Ref Range Status   Enterococcus faecalis DETECTED (A) NOT DETECTED Final    Comment: CRITICAL RESULT CALLED TO, READ BACK BY AND VERIFIED WITH: PHARMD JEREMY FRENS AT [id number] ON 6/85/22 BY KJ    Enterococcus Faecium NOT DETECTED NOT DETECTED Corrected    Comment: CORRECTED ON 06/08 AT 1234: PREVIOUSLY REPORTED AS NOT DETECTED CRITICAL RESULT CALLED TO, READ BACK BY AND VERIFIED WITH: PHARMD JEREMY FRENS AT [id number] ON 6/85/22 BY KJ   Listeria monocytogenes NOT DETECTED NOT DETECTED Final   Staphylococcus species NOT DETECTED NOT DETECTED Final   Staphylococcus aureus (BCID) NOT DETECTED NOT DETECTED Final   Staphylococcus epidermidis NOT DETECTED NOT DETECTED Final   Staphylococcus lugdunensis NOT DETECTED NOT DETECTED Final   Streptococcus species NOT DETECTED NOT DETECTED Final   Streptococcus agalactiae NOT DETECTED NOT DETECTED Final   Streptococcus pneumoniae NOT DETECTED NOT DETECTED Final   Streptococcus pyogenes NOT DETECTED NOT DETECTED Final   A.calcoaceticus-baumannii NOT DETECTED NOT DETECTED Final   Bacteroides fragilis NOT DETECTED NOT DETECTED Final   Enterobacterales NOT DETECTED NOT DETECTED Final   Enterobacter cloacae complex NOT DETECTED NOT DETECTED Final   Escherichia coli NOT  DETECTED NOT DETECTED Final   Klebsiella aerogenes NOT DETECTED NOT DETECTED Final   Klebsiella oxytoca NOT DETECTED NOT DETECTED Final   Klebsiella pneumoniae NOT DETECTED NOT DETECTED Final   Proteus species NOT DETECTED NOT DETECTED Final   Salmonella species NOT DETECTED NOT DETECTED Final   Serratia marcescens NOT DETECTED NOT DETECTED Final   Haemophilus  influenzae NOT DETECTED NOT DETECTED Final   Neisseria meningitidis NOT DETECTED NOT DETECTED Final   Pseudomonas aeruginosa NOT DETECTED NOT DETECTED Final   Stenotrophomonas maltophilia NOT DETECTED NOT DETECTED Final   Candida albicans NOT DETECTED NOT DETECTED Final   Candida auris NOT DETECTED NOT DETECTED Final   Candida glabrata NOT DETECTED NOT DETECTED Final   Candida krusei NOT DETECTED NOT DETECTED Final   Candida parapsilosis NOT DETECTED NOT DETECTED Final   Candida tropicalis NOT DETECTED NOT DETECTED Final   Cryptococcus neoformans/gattii NOT DETECTED NOT DETECTED Final   Vancomycin resistance NOT DETECTED NOT DETECTED Final    Comment: Performed at [facility], [place] [address], [place], [place] [address]  Culture, blood (x 2)     Status: None   Collection Time: 01/05/21  7:26 PM   Specimen: BLOOD  Result Value Ref Range Status   Specimen Description BLOOD SITE NOT SPECIFIED  Final   Special Requests   Final    BOTTLES DRAWN AEROBIC AND ANAEROBIC Blood Culture results may not be optimal due to an inadequate volume of blood received in culture bottles   Culture   Final    NO GROWTH 5 DAYS Performed at [facility], [place] [address] [place], [place] [address]    Report Status 01/10/2021 FINAL  Final  Resp Panel by RT-PCR (Flu A&B, Covid) Nasopharyngeal Swab     Status: None   Collection Time: 01/05/21  8:57 PM   Specimen: Nasopharyngeal Swab; Nasopharyngeal(NP) swabs in vial transport medium  Result Value Ref Range Status   SARS Coronavirus 2 by RT PCR NEGATIVE NEGATIVE Final    Comment:  (NOTE) SARS-CoV-2 target nucleic acids are NOT DETECTED.  The SARS-CoV-2 RNA is generally detectable in upper respiratory specimens during the acute phase of infection. The lowest concentration of SARS-CoV-2 viral copies this assay can detect is 138 copies/mL. A negative result does not preclude SARS-Cov-2 infection and should not be used as the sole basis for treatment or other patient management decisions. A negative result may occur with  improper specimen collection/handling, submission of specimen other than nasopharyngeal swab, presence of viral mutation(s) within the areas targeted by this assay, and inadequate number of viral copies(<138 copies/mL). A negative result must be combined with clinical observations, patient history, and epidemiological information. The expected result is Negative.  Fact Sheet for Patients:  EntrepreneurPulse.com.au  Fact Sheet for Healthcare Providers:  IncredibleEmployment.be  This test is no t yet approved or cleared by the [place] FDA and  has been authorized for detection and/or diagnosis of SARS-CoV-2 by FDA under an Emergency Use Authorization (EUA). This EUA will remain  in effect (meaning this test can be used) for the duration of the COVID-19 declaration under Section 564(b)(1) of the Act, 21 U.S.C.section 360bbb-3(b)(1), unless the authorization is terminated  or revoked sooner.       Influenza A by PCR NEGATIVE NEGATIVE Final   Influenza B by PCR NEGATIVE NEGATIVE Final    Comment: (NOTE) The Xpert Xpress SARS-CoV-2/FLU/RSV plus assay is intended as an aid in the diagnosis of influenza from Nasopharyngeal swab specimens and should not be used as a sole basis for treatment. Nasal washings and aspirates are unacceptable for Xpert Xpress SARS-CoV-2/FLU/RSV testing.  Fact Sheet for Patients: EntrepreneurPulse.com.au  Fact Sheet for Healthcare  Providers: IncredibleEmployment.be  This test is not yet approved or cleared by the [place] FDA and has been authorized for detection and/or diagnosis of SARS-CoV-2 by FDA under  an Emergency Use Authorization (EUA). This EUA will remain in effect (meaning this test can be used) for the duration of the COVID-19 declaration under Section 564(b)(1) of the Act, 21 U.S.C. section 360bbb-3(b)(1), unless the authorization is terminated or revoked.  Performed at [facility], [place] [address] [place], [place] [address]   Respiratory (~20 pathogens) panel by PCR     Status: None   Collection Time: 01/05/21 11:26 PM  Result Value Ref Range Status   Adenovirus NOT DETECTED NOT DETECTED Final   Coronavirus 229E NOT DETECTED NOT DETECTED Final    Comment: (NOTE) The Coronavirus on the Respiratory Panel, DOES NOT test for the novel  Coronavirus (2019 nCoV)    Coronavirus HKU1 NOT DETECTED NOT DETECTED Final   Coronavirus NL63 NOT DETECTED NOT DETECTED Final   Coronavirus OC43 NOT DETECTED NOT DETECTED Final   Metapneumovirus NOT DETECTED NOT DETECTED Final   Rhinovirus / Enterovirus NOT DETECTED NOT DETECTED Final   Influenza A NOT DETECTED NOT DETECTED Final   Influenza B NOT DETECTED NOT DETECTED Final   Parainfluenza Virus 1 NOT DETECTED NOT DETECTED Final   Parainfluenza Virus 2 NOT DETECTED NOT DETECTED Final   Parainfluenza Virus 3 NOT DETECTED NOT DETECTED Final   Parainfluenza Virus 4 NOT DETECTED NOT DETECTED Final   Respiratory Syncytial Virus NOT DETECTED NOT DETECTED Final   Bordetella pertussis NOT DETECTED NOT DETECTED Final   Bordetella Parapertussis NOT DETECTED NOT DETECTED Final   Chlamydophila pneumoniae NOT DETECTED NOT DETECTED Final   Mycoplasma pneumoniae NOT DETECTED NOT DETECTED Final    Comment: Performed at [facility], [place]. [address], [place], [place] [address]  Culture, blood (routine x 2)     Status: None (Preliminary result)    Collection Time: 01/07/21  5:27 AM   Specimen: BLOOD  Result Value Ref Range Status   Specimen Description BLOOD RIGHT ANTECUBITAL  Final   Special Requests   Final    BOTTLES DRAWN AEROBIC AND ANAEROBIC Blood Culture adequate volume   Culture   Final    NO GROWTH 4 DAYS Performed at [facility], [place] [address], [place], [place] [address]    Report Status PENDING  Incomplete  Culture, blood (routine x 2)     Status: None (Preliminary result)   Collection Time: 01/07/21  5:27 AM   Specimen: BLOOD RIGHT HAND  Result Value Ref Range Status   Specimen Description BLOOD RIGHT HAND  Final   Special Requests   Final    BOTTLES DRAWN AEROBIC AND ANAEROBIC Blood Culture adequate volume   Culture   Final    NO GROWTH 4 DAYS Performed at [facility], [place] [address], [place], [place] [address]    Report Status PENDING  Incomplete  MRSA Next Gen by PCR, Nasal     Status: None   Collection Time: 01/09/21  1:32 PM  Result Value Ref Range Status   MRSA by PCR Next Gen NOT DETECTED NOT DETECTED Final    Comment: (NOTE) The GeneXpert MRSA Assay (FDA approved for NASAL specimens only), is one component of a comprehensive MRSA colonization surveillance program. It is not intended to diagnose MRSA infection nor to guide or monitor treatment for MRSA infections. Test performance is not FDA approved in patients less than 78 years old. Performed at [facility], [place] [address], [place], [place] [address]     Gaylan Gerold, [place] [facility] [phone] pager   [phone] cell 01/11/2021, 1:10 PM

## 2021-01-12 DIAGNOSIS — J810 Acute pulmonary edema: Secondary | ICD-10-CM

## 2021-01-12 LAB — BASIC METABOLIC PANEL
Anion gap: 9 (ref 5–15)
BUN: 22 mg/dL (ref 8–23)
CO2: 29 mmol/L (ref 22–32)
Calcium: 8.3 mg/dL — ABNORMAL LOW (ref 8.9–10.3)
Chloride: 99 mmol/L (ref 98–111)
Creatinine, Ser: 1.97 mg/dL — ABNORMAL HIGH (ref 0.61–1.24)
GFR, Estimated: 37 mL/min — ABNORMAL LOW (ref >60)
Glucose, Bld: 202 mg/dL — ABNORMAL HIGH (ref 70–99)
Potassium: 4.5 mmol/L (ref 3.5–5.1)
Sodium: 137 mmol/L (ref 135–145)

## 2021-01-12 LAB — CBC
HCT: 25.7 % — ABNORMAL LOW (ref 39.0–52.0)
Hemoglobin: 8 g/dL — ABNORMAL LOW (ref 13.0–17.0)
MCH: 26.6 pg (ref 26.0–34.0)
MCHC: 31.1 g/dL (ref 30.0–36.0)
MCV: 85.4 fL (ref 80.0–100.0)
Platelets: 100 10*3/uL — ABNORMAL LOW (ref 150–400)
RBC: 3.01 MIL/uL — ABNORMAL LOW (ref 4.22–5.81)
RDW: 16.6 % — ABNORMAL HIGH (ref 11.5–15.5)
WBC: 4.8 10*3/uL (ref 4.0–10.5)
nRBC: 0 % (ref 0.0–0.2)

## 2021-01-12 LAB — GLUCOSE, CAPILLARY
Glucose-Capillary: 192 mg/dL — ABNORMAL HIGH (ref 70–99)
Glucose-Capillary: 156 mg/dL — ABNORMAL HIGH (ref 70–99)
Glucose-Capillary: 199 mg/dL — ABNORMAL HIGH (ref 70–99)
Glucose-Capillary: 191 mg/dL — ABNORMAL HIGH (ref 70–99)
Glucose-Capillary: 217 mg/dL — ABNORMAL HIGH (ref 70–99)
Glucose-Capillary: 210 mg/dL — ABNORMAL HIGH (ref 70–99)

## 2021-01-12 LAB — CULTURE, BLOOD (ROUTINE X 2)
Culture: NO GROWTH
Culture: NO GROWTH
Special Requests: ADEQUATE
Special Requests: ADEQUATE

## 2021-01-12 LAB — RENAL FUNCTION PANEL
Albumin: 2.7 g/dL — ABNORMAL LOW (ref 3.5–5.0)
Albumin: 2.7 g/dL — ABNORMAL LOW (ref 3.5–5.0)
Anion gap: 11 (ref 5–15)
Anion gap: 8 (ref 5–15)
BUN: 29 mg/dL — ABNORMAL HIGH (ref 8–23)
BUN: 22 mg/dL (ref 8–23)
CO2: 21 mmol/L — ABNORMAL LOW (ref 22–32)
CO2: 28 mmol/L (ref 22–32)
Calcium: 8.1 mg/dL — ABNORMAL LOW (ref 8.9–10.3)
Calcium: 8.1 mg/dL — ABNORMAL LOW (ref 8.9–10.3)
Chloride: 103 mmol/L (ref 98–111)
Chloride: 100 mmol/L (ref 98–111)
Creatinine, Ser: 2.22 mg/dL — ABNORMAL HIGH (ref 0.61–1.24)
Creatinine, Ser: 1.97 mg/dL — ABNORMAL HIGH (ref 0.61–1.24)
GFR, Estimated: 32 mL/min — ABNORMAL LOW (ref >60)
GFR, Estimated: 37 mL/min — ABNORMAL LOW (ref >60)
Glucose, Bld: 232 mg/dL — ABNORMAL HIGH (ref 70–99)
Glucose, Bld: 195 mg/dL — ABNORMAL HIGH (ref 70–99)
Phosphorus: 1.9 mg/dL — ABNORMAL LOW (ref 2.5–4.6)
Phosphorus: 1.9 mg/dL — ABNORMAL LOW (ref 2.5–4.6)
Potassium: 5.6 mmol/L — ABNORMAL HIGH (ref 3.5–5.1)
Potassium: 4.7 mmol/L (ref 3.5–5.1)
Sodium: 135 mmol/L (ref 135–145)
Sodium: 136 mmol/L (ref 135–145)

## 2021-01-12 LAB — MAGNESIUM: Magnesium: 2.2 mg/dL (ref 1.7–2.4)

## 2021-01-12 MED ORDER — SODIUM CHLORIDE 0.9 % IV SOLN: INTRAVENOUS | Status: DC

## 2021-01-12 NOTE — Progress Notes (Signed)
    [facility] has been requested to perform a transesophageal echocardiogram on Steven [name] for Bacteremia  After careful review of history and examination, the risks and benefits of transesophageal echocardiogram have been explained including risks of esophageal damage, perforation (1:10,000 risk), bleeding, pharyngeal hematoma as well as other potential complications associated with conscious sedation including aspiration, arrhythmia, respiratory failure and death. Alternatives to treatment were discussed, questions were answered. Patient is willing to proceed.  TEE - Dr. Oval Linsey 01/13/21 '@830am'$  . NPO after midnight.   Leanor Kail, PA-C 01/12/2021 3:46 PM

## 2021-01-12 NOTE — Progress Notes (Signed)
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  [place] with some hydro to transplanted kidney.  Possibly cardiorenal syndrome.  Has nephrotic range proteinuria without overt nephrotic syndrome.   - IR placed tempcath 01/08/21 - on CRRT 6/11- present - volume status improving with aggressive volume removal--> anticipate 24 more hrs on CRRT and then can transition to IHD as long as remains off pressors - SPEP and free light chains pending Acute hypoxic respiratory failure - secondary to recurrent pleural effusions. Acute on chronic CHF - ECHO in 3/22 showed preserved EF with grade II DD and mildly increased gradient across TAVR.  Repeat ECHO with EF 60-65%, mod LVH, Grade II DD, normal RV function   Immunosuppression - continue with home dose of prograf, Myfortic held due to neutropenia.  Tac trough pending Enterococcal Faecalis bacteremia - ID following and managing abx, ampicillin and ceftriaxone Pancytopenia - Myfortic on hold.  WBC 1.2--> 2.5--> 4.8  Anemia of CKD and iron deficiency - low TSAT s/p feraheme, transfuse prbcs as needed Recurrent bilateral pleural effusions - s/p bilateral PleurX catheters on 01/01/21.  Drain per primary svc. HTN - stable DM - per primary Dispo: in ICU  Subjective:    Seen in room.  Continues on CRRT.  No urine output overnight.  K 5.6, changed all to 2K.     Objective:   BP (!) 108/59   Pulse 73   Temp (!) 97.4 F (36.3 C) (Oral)   Resp (!) 22   Ht '5\' 11"'$  (1.803 m)   Wt 103.4 kg   SpO2 95%   BMI 31.79 kg/m   Physical Exam: Gen: lying in bed, awake and conversant CVS: tachycardic Resp: muffled breath sounds bilaterally Abd: some abd wall edema Ext: 2+ RLE edema, s/p BKA on the L with some edema as well, improving ACCESS: R IJ nontunneled HD cath  Labs: BMET Recent Labs  Lab 01/09/21 0311 01/09/21 1524  01/10/21 0403 01/10/21 1537 01/11/21 0352 01/11/21 1513 01/11/21 2055 01/12/21 0132  NA 138 138 137 136 137 136 135 135  K 4.8 5.0 4.4 5.2* 4.7 5.4* 5.5* 5.6*  CL 103 105 104 103 104 98 99 103  CO2 21* '22 23 26 27 28 29 '$ 21*  GLUCOSE 88 99 61* 222* 136* 287* 256* 232*  BUN 85* 83* 60* 49* 36* 32* 30* 29*  CREATININE 5.80* 5.62* 4.20* 3.45* 2.75* 2.43* 2.41* 2.22*  CALCIUM 8.5* 8.2* 8.1* 7.8* 8.1* 8.3* 8.0* 8.1*  PHOS 6.1* 5.3* 3.5 3.1 2.5 2.2*  --  1.9*   CBC Recent Labs  Lab 01/05/21 1550 01/07/21 0527 01/09/21 0311 01/09/21 1255 01/09/21 1307 01/10/21 0403 01/11/21 1000 01/12/21 0132  WBC 4.9   < > 1.2*  --  1.2* 2.5* 3.3* 4.8  NEUTROABS 4.0  --  0.6*  --   --   --   --   --   HGB 8.7*   < > 6.9*   < > 7.7* 7.9* 7.5* 8.0*  HCT 26.7*   < > 21.7*   < > 25.0* 24.5* 23.8* 25.7*  MCV 80.9   < > 83.1  --  86.2 81.9 84.4 85.4  PLT 105*   < > 90*  --  100* 107* 90* 100*   < > = values in this interval not displayed.      Medications:  aspirin  81 mg Oral Daily   atorvastatin  10 mg Oral Daily   carvedilol  3.125 mg Oral BID WC   Chlorhexidine Gluconate Cloth  6 each Topical Daily   clopidogrel  75 mg Oral Daily   docusate sodium  100 mg Oral Daily   feeding supplement  237 mL Oral TID BM   fenofibrate  160 mg Oral Daily   insulin aspart  0-15 Units Subcutaneous TID WC   insulin aspart  0-5 Units Subcutaneous QHS   insulin glargine  10 Units Subcutaneous QHS   multivitamin with minerals  1 tablet Oral Daily   pantoprazole  40 mg Oral Daily   sodium chloride flush  3 mL Intravenous Q12H   tacrolimus  1 mg Oral BID     Madelon Lips, MD 01/12/2021, 9:46 AM

## 2021-01-12 NOTE — Progress Notes (Signed)
Steven [name] for Infectious Disease  Date of Admission:  01/05/2021   Total days of antibiotics 8         ASSESSMENT: Patient was afebrile overnight.  His pancytopenia slowly improving.  His TEE was on pause due to leukopenia and anemia.  Spoken to [place] this morning, they will reevaluate him and hopefully schedule his TEE in the next few days.  Continue IV ampicillin and ceftriaxone for Enterococcus bacteremia.  PLAN: Continue IV ampicillin and ceftriaxone Pending TEE  Active Problems:   AKI (acute kidney injury) ([facility])   Acute on chronic diastolic CHF (congestive heart failure) (HCC)   CHF (congestive heart failure) (HCC)   Pressure injury of skin   Scheduled Meds:  aspirin  81 mg Oral Daily   atorvastatin  10 mg Oral Daily   carvedilol  3.125 mg Oral BID WC   Chlorhexidine Gluconate Cloth  6 each Topical Daily   clopidogrel  75 mg Oral Daily   docusate sodium  100 mg Oral Daily   feeding supplement  237 mL Oral TID BM   fenofibrate  160 mg Oral Daily   insulin aspart  0-15 Units Subcutaneous TID WC   insulin aspart  0-5 Units Subcutaneous QHS   insulin glargine  10 Units Subcutaneous QHS   multivitamin with minerals  1 tablet Oral Daily   pantoprazole  40 mg Oral Daily   sodium chloride flush  3 mL Intravenous Q12H   tacrolimus  1 mg Oral BID   Continuous Infusions:  sodium chloride 5 mL/hr at 01/12/21 1100   ampicillin (OMNIPEN) IV Stopped (01/12/21 [id number])   cefTRIAXone (ROCEPHIN)  IV Stopped (01/12/21 0945)   prismasol BGK 2/2.5 replacement solution 400 mL/hr at 01/12/21 0736   prismasol BGK 2/2.5 replacement solution 300 mL/hr at 01/12/21 1148   prismasol BGK 2/2.5 replacement solution 1,500 mL/hr at 01/12/21 1146   PRN Meds:.sodium chloride, acetaminophen, cyclobenzaprine, heparin, labetalol, lidocaine, ondansetron (ZOFRAN) IV, sodium chloride, sodium chloride flush, traMADol, traZODone   SUBJECTIVE: Patient is seen at bedside today.  He is  appears in no acute distress.  His wife is also at bedside.  Patient denies any complaints.  Review of Systems: ROS Per HPI  Allergies  Allergen Reactions   Sulfa Antibiotics Rash    OBJECTIVE: Vitals:   01/12/21 1000 01/12/21 1030 01/12/21 1100 01/12/21 1130  BP: 92/64 (!) 90/53 (!) 111/56 (!) 102/57  Pulse: 69 61 62 62  Resp: '18 15 18 20  '$ Temp:      TempSrc:      SpO2: 100% 100% 100% 100%  Weight:      Height:       Body mass index is 31.79 kg/m.  Physical Exam Constitutional:      General: He is not in acute distress.    Appearance: He is ill-appearing.     Comments: In no acute distress.  Appears lethargic  HENT:     Head: Normocephalic.  Eyes:     General: No scleral icterus.       Right eye: No discharge.        Left eye: No discharge.     Conjunctiva/sclera: Conjunctivae normal.  Cardiovascular:     Rate and Rhythm: Normal rate and regular rhythm.  Pulmonary:     Effort: Pulmonary effort is normal. No respiratory distress.  Skin:    General: Skin is warm.  Neurological:     Mental Status: He is alert. Mental status  is at baseline.  Psychiatric:        Mood and Affect: Mood normal.        Thought Content: Thought content normal.        Judgment: Judgment normal.    Lab Results Lab Results  Component Value Date   WBC 4.8 01/12/2021   HGB 8.0 (L) 01/12/2021   HCT 25.7 (L) 01/12/2021   MCV 85.4 01/12/2021   PLT 100 (L) 01/12/2021    Lab Results  Component Value Date   CREATININE 2.22 (H) 01/12/2021   BUN 29 (H) 01/12/2021   NA 135 01/12/2021   K 5.6 (H) 01/12/2021   CL 103 01/12/2021   CO2 21 (L) 01/12/2021    Lab Results  Component Value Date   ALT 15 01/07/2021   AST 21 01/07/2021   ALKPHOS 92 01/07/2021   BILITOT 0.6 01/07/2021     Microbiology: Recent Results (from the past 240 hour(s))  Culture, blood (x 2)     Status: Abnormal   Collection Time: 01/05/21  4:36 PM   Specimen: BLOOD LEFT FOREARM  Result Value Ref Range Status    Specimen Description BLOOD LEFT FOREARM  Final   Special Requests   Final    BOTTLES DRAWN AEROBIC AND ANAEROBIC Blood Culture adequate volume   Culture  Setup Time   Final    GRAM POSITIVE COCCI IN BOTH AEROBIC AND ANAEROBIC BOTTLES CRITICAL RESULT CALLED TO, READ BACK BY AND VERIFIED WITH: PHARMD JEREMY FRENS AT [id number] [place] ON 01/06/21 BY KJ Performed at [facility], [place] [address] [place], [place] [address]    Culture ENTEROCOCCUS FAECALIS (A)  Final   Report Status 01/08/2021 FINAL  Final   Organism ID, Bacteria ENTEROCOCCUS FAECALIS  Final      Susceptibility   Enterococcus faecalis - MIC*    AMPICILLIN <=2 SENSITIVE Sensitive     VANCOMYCIN 1 SENSITIVE Sensitive     GENTAMICIN SYNERGY SENSITIVE Sensitive     * ENTEROCOCCUS FAECALIS  Blood Culture ID Panel (Reflexed)     Status: Abnormal   Collection Time: 01/05/21  4:36 PM  Result Value Ref Range Status   Enterococcus faecalis DETECTED (A) NOT DETECTED Final    Comment: CRITICAL RESULT CALLED TO, READ BACK BY AND VERIFIED WITH: PHARMD JEREMY FRENS AT [id number] ON 6/85/22 BY KJ    Enterococcus Faecium NOT DETECTED NOT DETECTED Corrected    Comment: CORRECTED ON 06/08 AT 1234: PREVIOUSLY REPORTED AS NOT DETECTED CRITICAL RESULT CALLED TO, READ BACK BY AND VERIFIED WITH: PHARMD JEREMY FRENS AT [id number] ON 6/85/22 BY KJ   Listeria monocytogenes NOT DETECTED NOT DETECTED Final   Staphylococcus species NOT DETECTED NOT DETECTED Final   Staphylococcus aureus (BCID) NOT DETECTED NOT DETECTED Final   Staphylococcus epidermidis NOT DETECTED NOT DETECTED Final   Staphylococcus lugdunensis NOT DETECTED NOT DETECTED Final   Streptococcus species NOT DETECTED NOT DETECTED Final   Streptococcus agalactiae NOT DETECTED NOT DETECTED Final   Streptococcus pneumoniae NOT DETECTED NOT DETECTED Final   Streptococcus pyogenes NOT DETECTED NOT DETECTED Final   A.calcoaceticus-baumannii NOT DETECTED NOT DETECTED Final   Bacteroides fragilis NOT  DETECTED NOT DETECTED Final   Enterobacterales NOT DETECTED NOT DETECTED Final   Enterobacter cloacae complex NOT DETECTED NOT DETECTED Final   Escherichia coli NOT DETECTED NOT DETECTED Final   Klebsiella aerogenes NOT DETECTED NOT DETECTED Final   Klebsiella oxytoca NOT DETECTED NOT DETECTED Final   Klebsiella pneumoniae NOT DETECTED NOT DETECTED Final  Proteus species NOT DETECTED NOT DETECTED Final   Salmonella species NOT DETECTED NOT DETECTED Final   Serratia marcescens NOT DETECTED NOT DETECTED Final   Haemophilus influenzae NOT DETECTED NOT DETECTED Final   Neisseria meningitidis NOT DETECTED NOT DETECTED Final   Pseudomonas aeruginosa NOT DETECTED NOT DETECTED Final   Stenotrophomonas maltophilia NOT DETECTED NOT DETECTED Final   Candida albicans NOT DETECTED NOT DETECTED Final   Candida auris NOT DETECTED NOT DETECTED Final   Candida glabrata NOT DETECTED NOT DETECTED Final   Candida krusei NOT DETECTED NOT DETECTED Final   Candida parapsilosis NOT DETECTED NOT DETECTED Final   Candida tropicalis NOT DETECTED NOT DETECTED Final   Cryptococcus neoformans/gattii NOT DETECTED NOT DETECTED Final   Vancomycin resistance NOT DETECTED NOT DETECTED Final    Comment: Performed at [facility], [address], [place], [place] [address]  Culture, blood (x 2)     Status: None   Collection Time: 01/05/21  7:26 PM   Specimen: BLOOD  Result Value Ref Range Status   Specimen Description BLOOD SITE NOT SPECIFIED  Final   Special Requests   Final    BOTTLES DRAWN AEROBIC AND ANAEROBIC Blood Culture results may not be optimal due to an inadequate volume of blood received in culture bottles   Culture   Final    NO GROWTH 5 DAYS Performed at [facility], [place] [address] [place], [place] [address]    Report Status 01/10/2021 FINAL  Final  Resp Panel by RT-PCR (Flu A&B, Covid) Nasopharyngeal Swab     Status: None   Collection Time: 01/05/21  8:57 PM   Specimen: Nasopharyngeal  Swab; Nasopharyngeal(NP) swabs in vial transport medium  Result Value Ref Range Status   SARS Coronavirus 2 by RT PCR NEGATIVE NEGATIVE Final    Comment: (NOTE) SARS-CoV-2 target nucleic acids are NOT DETECTED.  The SARS-CoV-2 RNA is generally detectable in upper respiratory specimens during the acute phase of infection. The lowest concentration of SARS-CoV-2 viral copies this assay can detect is 138 copies/mL. A negative result does not preclude SARS-Cov-2 infection and should not be used as the sole basis for treatment or other patient management decisions. A negative result may occur with  improper specimen collection/handling, submission of specimen other than nasopharyngeal swab, presence of viral mutation(s) within the areas targeted by this assay, and inadequate number of viral copies(<138 copies/mL). A negative result must be combined with clinical observations, patient history, and epidemiological information. The expected result is Negative.  Fact Sheet for Patients:  EntrepreneurPulse.com.au  Fact Sheet for Healthcare Providers:  IncredibleEmployment.be  This test is no t yet approved or cleared by the [place] FDA and  has been authorized for detection and/or diagnosis of SARS-CoV-2 by FDA under an Emergency Use Authorization (EUA). This EUA will remain  in effect (meaning this test can be used) for the duration of the COVID-19 declaration under Section 564(b)(1) of the Act, 21 U.S.C.section 360bbb-3(b)(1), unless the authorization is terminated  or revoked sooner.       Influenza A by PCR NEGATIVE NEGATIVE Final   Influenza B by PCR NEGATIVE NEGATIVE Final    Comment: (NOTE) The Xpert Xpress SARS-CoV-2/FLU/RSV plus assay is intended as an aid in the diagnosis of influenza from Nasopharyngeal swab specimens and should not be used as a sole basis for treatment. Nasal washings and aspirates are unacceptable for Xpert Xpress  SARS-CoV-2/FLU/RSV testing.  Fact Sheet for Patients: EntrepreneurPulse.com.au  Fact Sheet for Healthcare Providers:  IncredibleEmployment.be  This test is not yet approved or cleared by the [place] and has been authorized for detection and/or diagnosis of SARS-CoV-2 by FDA under an Emergency Use Authorization (EUA). This EUA will remain in effect (meaning this test can be used) for the duration of the COVID-19 declaration under Section 564(b)(1) of the Act, 21 U.S.C. section 360bbb-3(b)(1), unless the authorization is terminated or revoked.  Performed at [facility], [place] [address], [place], [place] [address]   Respiratory (~20 pathogens) panel by PCR     Status: None   Collection Time: 01/05/21 11:26 PM  Result Value Ref Range Status   Adenovirus NOT DETECTED NOT DETECTED Final   Coronavirus 229E NOT DETECTED NOT DETECTED Final    Comment: (NOTE) The Coronavirus on the Respiratory Panel, DOES NOT test for the novel  Coronavirus (2019 nCoV)    Coronavirus HKU1 NOT DETECTED NOT DETECTED Final   Coronavirus NL63 NOT DETECTED NOT DETECTED Final   Coronavirus OC43 NOT DETECTED NOT DETECTED Final   Metapneumovirus NOT DETECTED NOT DETECTED Final   Rhinovirus / Enterovirus NOT DETECTED NOT DETECTED Final   Influenza A NOT DETECTED NOT DETECTED Final   Influenza B NOT DETECTED NOT DETECTED Final   Parainfluenza Virus 1 NOT DETECTED NOT DETECTED Final   Parainfluenza Virus 2 NOT DETECTED NOT DETECTED Final   Parainfluenza Virus 3 NOT DETECTED NOT DETECTED Final   Parainfluenza Virus 4 NOT DETECTED NOT DETECTED Final   Respiratory Syncytial Virus NOT DETECTED NOT DETECTED Final   Bordetella pertussis NOT DETECTED NOT DETECTED Final   Bordetella Parapertussis NOT DETECTED NOT DETECTED Final   Chlamydophila pneumoniae NOT DETECTED NOT DETECTED Final   Mycoplasma pneumoniae NOT DETECTED NOT DETECTED Final    Comment: Performed at  [facility], [place]. [address] [place], [place] [address]  Culture, blood (routine x 2)     Status: None   Collection Time: 01/07/21  5:27 AM   Specimen: BLOOD  Result Value Ref Range Status   Specimen Description BLOOD RIGHT ANTECUBITAL  Final   Special Requests   Final    BOTTLES DRAWN AEROBIC AND ANAEROBIC Blood Culture adequate volume   Culture   Final    NO GROWTH 5 DAYS Performed at [facility], [place] [address] [place], [place] [address]    Report Status 01/12/2021 FINAL  Final  Culture, blood (routine x 2)     Status: None   Collection Time: 01/07/21  5:27 AM   Specimen: BLOOD RIGHT HAND  Result Value Ref Range Status   Specimen Description BLOOD RIGHT HAND  Final   Special Requests   Final    BOTTLES DRAWN AEROBIC AND ANAEROBIC Blood Culture adequate volume   Culture   Final    NO GROWTH 5 DAYS Performed at [facility], [place] [address] [place], [place] [address]    Report Status 01/12/2021 FINAL  Final  MRSA Next Gen by PCR, Nasal     Status: None   Collection Time: 01/09/21  1:32 PM  Result Value Ref Range Status   MRSA by PCR Next Gen NOT DETECTED NOT DETECTED Final    Comment: (NOTE) The GeneXpert MRSA Assay (FDA approved for NASAL specimens only), is one component of a comprehensive MRSA colonization surveillance program. It is not intended to diagnose MRSA infection nor to guide or monitor treatment for MRSA infections. Test performance is not FDA approved in patients less than 59 years old. Performed at  [facility], [place] [address], [place], [place] [address]     Gaylan Gerold, [facility] [place] [facility] [phone] pager   [phone] cell 01/12/2021, 11:50 AM

## 2021-01-12 NOTE — Progress Notes (Addendum)
NAME:  Steven [name], MRN:  [id number], DOB:  16-Apr-1956, LOS: 7 ADMISSION DATE:  01/05/2021, CONSULTATION DATE:  01/12/2021 REFERRING MD:  Jacky Kindle, MD, CHIEF COMPLAINT:  Uremia, renal failure  History of Present Illness:  Mr. Steven [name] is a 65 year old gentleman with a history of type 2 diabetes, chronic heart failure preserved ejection fraction, peripheral vascular disease status post left BKA, CKD stage IV of transplanted kidney, aortic stenosis status post TAVR in February 2022 who presents with worsening shortness of breath on 01/09/2021.  Despite successful TAVR in February 2022 he has had worsening shortness of breath and a CT chest showed bilateral pleural effusions and ascites.  He was referred to cardiothoracic surgery as an outpatient for bilateral Pleurx catheter placement in the beginning of June.  Has had thoracentesis large-volume and now large volume drainage from his Pleurx catheters.  He presented to the emergency department on June 7 for worsening shortness of breath.  He was found to have Enterococcus faecalis bacteremia.  His renal failure has progressively worsened and he is now exhibiting signs of uremia with encephalopathy.  PCCM are called to see the patient by Dr. Marval Regal and Dr. Darrick Meigs for transfer to the ICU for continuous renal replacement therapy in the setting of encephalopathy and uremia.   Pertinent  Medical History  Kidney transplant recipient, now with CKD stage IV Type 2 diabetes Severe aortic stenosis status post TAVR Peripheral vascular disease status post left BKA  Significant Hospital Events: Including procedures, antibiotic start and stop dates in addition to other pertinent events   6/3Pleurx catheters placed 6/7 presented to [facility], ED worsening shortness of breath 6/8 blood cultures positive for Enterococcus faecalis 6/10 temporary HD catheter placed 6/11 worsening anasarca an encephalopathy, PCCM consulted 6/13 On CRRT; alert and  oriented  Interim History / Subjective:   On 2 l/m [place]; wore CPAP last night CRRT running; Nephro planning on continuing CRRT another 24 hours and consider stopping to see if kidneys respond Patient does not want to be on dialysis long term; Family coming this week on Thursday at 10 am. Just finished eating breakfast; alert and oriented   Objective   Blood pressure 125/61, pulse 68, temperature 97.6 F (36.4 C), temperature source Axillary, resp. rate 15, height '5\' 11"'$  (1.803 m), weight 103.4 kg, SpO2 100 %.        Intake/Output Summary (Last 24 hours) at 01/12/2021 0656 Last data filed at 01/12/2021 0600 Gross per 24 hour  Intake 2226.97 ml  Output 6440 ml  Net -4213.03 ml    Filed Weights   01/10/21 0500 01/11/21 0500 01/12/21 0500  Weight: 116.2 kg 107.6 kg 103.4 kg    Examination: General:  NAD  HEENT: MM pink/moist; [place] in place Neuro: Aox3 CV: s1s2 RRR PULM:  dim clear bs bilaterally; [place] 2 l/m; wore CPAP at bedtime GI: soft, bsx4 active; non tender Extremities: warm/dry, no edema  Skin: no rashes or lesions  Labs/imaging that I havepersonally reviewed  (right click and "Reselect all SmartList Selections" daily)  K 5.6, Mag 2.2 BUN 29, Creat 2.22 (from 2.75) Glucose 232 WBC 4.8, Hgb 8, platelets 100 (trending up)   Resolved Hospital Problem list     Assessment & Plan:  [name] with CKD stage IV on the transplanted kidney, type 2 diabetes with peripheral vascular disease and left BKA, aortic stenosis status post TAVR who presents with  Acute metabolic encephalopathy, secondary to uremia Stage IV CKD of transplanted kidney, now requiring renal  placement therapy Hyperkalemia Plan: -Nephrology following: continue CVVHD for 24 hours -Trend BMP/UO  Enterococcus faecalis bacteremia Is on ampicillin and ceftriaxone, ID is following Plan: -ID following: ordered TEE -Continue Ampicillin and Ceftriaxone  Type II diabetes mellitus Plan: -Blood sugar  232 -SSI increased and Lantus restarted last night -CBG monitoring -consider increasing SSI tomorrow if blood sugar don't improve  Hypertension Plan: -continue home BP meds  Acute on chronic anemia Pancytopenia: on Tacrolimus for kidney transplant Likely secondary to worsening renal failure in setting of chronic disease Plan: -trend CBC -Transfuse for hgb <7  Hx of OSA?: patient states he wears CPAP at bedtime at home Plan: -CPAP QHS  GOC Plan: -Family coming Thursday (6/16) at 10 am for family conversation -Patient does not want long term dialysis -Palliative care consulted  Best practice (right click and "Reselect all SmartList Selections" daily)  Diet:  Oral Pain/Anxiety/Delirium protocol (if indicated): No VAP protocol (if indicated): Not indicated DVT prophylaxis: Subcutaneous Heparin GI prophylaxis: PPI Glucose control:  SSI Yes and Basal insulin Yes Central venous access:  Yes, and it is still needed Arterial line:  N/A Foley:  N/A Mobility:  OOB  PT consulted: N/A Last date of multidisciplinary goals of care discussion [6/13: spoke with patient and his parents at bedside] Code Status:  full code Disposition: ICU   This patient is critically ill with multiple organ system failure; which, requires frequent high complexity decision making, assessment, support, evaluation, and titration of therapies. This was completed through the application of advanced monitoring technologies and extensive interpretation of multiple databases. During this encounter critical care time was devoted to patient care services described in this note for 35 minutes.  JD Rexene Agent [facility] 01/12/2021, 6:56 AM  Please see [email or web address] for pager details.  From 7A-7P if no response, please call [phone]. After hours, please call ELink [phone].

## 2021-01-13 ENCOUNTER — Encounter (HOSPITAL_COMMUNITY): Payer: Self-pay | Admitting: Internal Medicine

## 2021-01-13 ENCOUNTER — Inpatient Hospital Stay (HOSPITAL_COMMUNITY): Payer: BC Managed Care – PPO

## 2021-01-13 ENCOUNTER — Encounter (HOSPITAL_COMMUNITY)
Admission: EM | Disposition: A | Discharge status: Hospice | Payer: Self-pay | Source: Home / Self Care | Attending: Family Medicine

## 2021-01-13 ENCOUNTER — Ambulatory Visit: Payer: BC Managed Care – PPO | Admitting: Cardiovascular Disease

## 2021-01-13 ENCOUNTER — Inpatient Hospital Stay (HOSPITAL_COMMUNITY): Payer: BC Managed Care – PPO | Admitting: Anesthesiology

## 2021-01-13 DIAGNOSIS — Z515 Encounter for palliative care: Secondary | ICD-10-CM

## 2021-01-13 DIAGNOSIS — I350 Nonrheumatic aortic (valve) stenosis: Secondary | ICD-10-CM

## 2021-01-13 DIAGNOSIS — I342 Nonrheumatic mitral (valve) stenosis: Secondary | ICD-10-CM

## 2021-01-13 DIAGNOSIS — R7881 Bacteremia: Secondary | ICD-10-CM

## 2021-01-13 HISTORY — PX: TEE WITHOUT CARDIOVERSION: SHX5443

## 2021-01-13 LAB — RENAL FUNCTION PANEL
Albumin: 2.6 g/dL — ABNORMAL LOW (ref 3.5–5.0)
Anion gap: 9 (ref 5–15)
BUN: 22 mg/dL (ref 8–23)
CO2: 29 mmol/L (ref 22–32)
Calcium: 8.1 mg/dL — ABNORMAL LOW (ref 8.9–10.3)
Chloride: 98 mmol/L (ref 98–111)
Creatinine, Ser: 1.87 mg/dL — ABNORMAL HIGH (ref 0.61–1.24)
GFR, Estimated: 40 mL/min — ABNORMAL LOW (ref >60)
Glucose, Bld: 175 mg/dL — ABNORMAL HIGH (ref 70–99)
Phosphorus: 1.7 mg/dL — ABNORMAL LOW (ref 2.5–4.6)
Potassium: 4.6 mmol/L (ref 3.5–5.1)
Sodium: 136 mmol/L (ref 135–145)

## 2021-01-13 LAB — ECHO TEE
AV Mean grad: 16 mmHg
AV Peak grad: 27.2 mmHg
Ao pk vel: 2.61 m/s

## 2021-01-13 LAB — CBC
HCT: 22.4 % — ABNORMAL LOW (ref 39.0–52.0)
Hemoglobin: 7.1 g/dL — ABNORMAL LOW (ref 13.0–17.0)
MCH: 27.1 pg (ref 26.0–34.0)
MCHC: 31.7 g/dL (ref 30.0–36.0)
MCV: 85.5 fL (ref 80.0–100.0)
Platelets: 99 10*3/uL — ABNORMAL LOW (ref 150–400)
RBC: 2.62 MIL/uL — ABNORMAL LOW (ref 4.22–5.81)
RDW: 17.3 % — ABNORMAL HIGH (ref 11.5–15.5)
WBC: 3.4 10*3/uL — ABNORMAL LOW (ref 4.0–10.5)
nRBC: 0 % (ref 0.0–0.2)

## 2021-01-13 LAB — GLUCOSE, CAPILLARY
Glucose-Capillary: 103 mg/dL — ABNORMAL HIGH (ref 70–99)
Glucose-Capillary: 80 mg/dL (ref 70–99)
Glucose-Capillary: 170 mg/dL — ABNORMAL HIGH (ref 70–99)
Glucose-Capillary: 188 mg/dL — ABNORMAL HIGH (ref 70–99)

## 2021-01-13 LAB — MAGNESIUM: Magnesium: 2.2 mg/dL (ref 1.7–2.4)

## 2021-01-13 SURGERY — ECHOCARDIOGRAM, TRANSESOPHAGEAL
Anesthesia: Monitor Anesthesia Care

## 2021-01-13 MED ORDER — FUROSEMIDE 10 MG/ML IJ SOLN: 100.0000 mg | Freq: Once | INTRAVENOUS | Status: DC

## 2021-01-13 MED ORDER — PROPOFOL 500 MG/50ML IV EMUL: INTRAVENOUS | Status: DC | PRN

## 2021-01-13 MED ORDER — PROPOFOL 500 MG/50ML IV EMUL
INTRAVENOUS | Status: DC | PRN
  Administered 2021-01-13: 125 ug/kg/min via INTRAVENOUS

## 2021-01-13 MED ORDER — EPHEDRINE SULFATE 50 MG/ML IJ SOLN
INTRAMUSCULAR | Status: DC | PRN
  Administered 2021-01-13 (×2): 10 mg via INTRAVENOUS

## 2021-01-13 MED ORDER — SODIUM CHLORIDE 0.9 % IV SOLN: INTRAVENOUS | Status: DC

## 2021-01-13 NOTE — Anesthesia Preprocedure Evaluation (Addendum)
Anesthesia Evaluation  Patient identified by MRN, date of birth, ID band Patient awake    Reviewed: Allergy & Precautions, NPO status , [name] Chart, lab work & pertinent test results  Airway Mallampati: II  TM Distance: >3 FB Neck ROM: Full    Dental  (+) Chipped, Missing, Poor Dentition, Dental Advisory Given,    Pulmonary sleep apnea and Continuous Positive Airway Pressure Ventilation ,  Acute hypoxic respiratory failure - secondary to recurrent pleural effusions s/p bilateral PleurX catheters on 01/01/21   Pulmonary exam normal breath sounds clear to auscultation       Cardiovascular hypertension, + Peripheral Vascular Disease and +CHF  Normal cardiovascular exam+ Valvular Problems/Murmurs (s/p TAVR 09/2020)  Rhythm:Regular Rate:Normal  TTE 01/07/21  1. Left ventricular ejection fraction, by estimation, is 60 to 65%. The left ventricle has normal function. The left ventricle has no regional wall motion abnormalities. There is moderate left ventricular hypertrophy. Left ventricular diastolic parameters are consistent with Grade II diastolic dysfunction (pseudonormalization).  2. Right ventricular systolic function is normal. The right ventricular size is mildly enlarged. Tricuspid regurgitation signal is inadequate for assessing PA pressure.  3. Left atrial size was mildly dilated.  4. The mitral valve is abnormal with severe calcification of the valve and annulus. Cannot rule out endocarditis from this study. No evidence of mitral valve regurgitation. Moderate mitral stenosis. The mean mitral  valve gradient is 6.0 mmHg, MVA 1.7  cm^2 by VTI.  5. Bioprosthetic aortic valve s/p TAVR. No significant peri-valvular leakage noted. Mean gradient 12 mmHg. No vegetation seen on the bioprosthetic aortic valve but cannot fully rule out endocarditis from this study.  6. The inferior vena cava is normal in size with greater than 50%  respiratory variability, suggesting right atrial pressure of 3 mmHg.  7. Cannot rule out endocarditis from this study.   Neuro/Psych negative neurological ROS  negative psych ROS   GI/Hepatic Neg liver ROS, GERD  ,  Endo/Other  negative endocrine ROSdiabetes, Type 2, Oral Hypoglycemic Agents, Insulin Dependent  Renal/GU ESRFRenal diseaseESRD s/p LRD kidney transplant 15 years ago complicated byAKI/CKD stage IIIb - in setting of decompensated CHF. [place] with some hydro to transplanted kidney. Possibly cardiorenal syndrome.  Has nephrotic range proteinuria without overt nephrotic syndrome.   - IR placed tempcath 01/08/21 - on CRRT 6/11- present  negative genitourinary   Musculoskeletal negative musculoskeletal ROS (+)   Abdominal   Peds  Hematology  (+) Blood dyscrasia (on plavix, Hgb 8.0, plt 100), anemia ,   Anesthesia Other Findings Enterococcal Faecalis bacteremia   Reproductive/Obstetrics                            Anesthesia Physical Anesthesia Plan  ASA: 4  Anesthesia Plan: MAC   Post-op Pain Management:    Induction: Intravenous  PONV Risk Score and Plan: 1 and Propofol infusion and Treatment may vary due to age or medical condition  Airway Management Planned: Natural Airway  Additional Equipment:   Intra-op Plan:   Post-operative Plan:   Informed Consent: I have reviewed the patients History and Physical, chart, labs and discussed the procedure including the risks, benefits and alternatives for the proposed anesthesia with the patient or authorized representative who has indicated his/her understanding and acceptance.     Dental advisory given  Plan Discussed with: CRNA  Anesthesia Plan Comments:         Anesthesia Quick Evaluation

## 2021-01-13 NOTE — Anesthesia Postprocedure Evaluation (Signed)
Anesthesia Post Note  Patient: Steven [name]  Procedure(s) Performed: TRANSESOPHAGEAL ECHOCARDIOGRAM (TEE)     Patient location during evaluation: Endoscopy Anesthesia Type: MAC Level of consciousness: awake and alert Pain management: pain level controlled Vital Signs Assessment: post-procedure vital signs reviewed and stable Respiratory status: spontaneous breathing, nonlabored ventilation, respiratory function stable and patient connected to nasal cannula oxygen Cardiovascular status: blood pressure returned to baseline and stable Postop Assessment: no apparent nausea or vomiting Anesthetic complications: no   No notable events documented.  Last Vitals:  Vitals:   01/13/21 1100 01/13/21 1215  BP: (!) 111/52 (!) 101/44  Pulse: 73 71  Resp: 17 (!) 21  Temp: 36.7 C   SpO2: 100% 100%    Last Pain:  Vitals:   01/13/21 1100  TempSrc: Axillary  PainSc:                  Talesha Ellith[place]pe [place]

## 2021-01-13 NOTE — Progress Notes (Signed)
Physical Therapy Treatment Patient Details Name: Steven [name] PATI MRN: [id number] DOB: 05/18/1956 Today's Date: 01/13/2021    History of Present Illness Pt is a 65 y.o. male presenting on 01/05/2021 with SOB and low-grade fever. Pt with pulmonary edema and pleural effusion R>L. 6/11 CRRT started. 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    PT Comments    Pt making steady progress toward goals, limited by L LE swelling and inability to fully seat the L leg in the prosthesis.  Emphasis on transition to EOB, sit to stand, pregait activity in standing in the RW and transfer to the chair in the RW.    Follow Up Recommendations  SNF     Equipment Recommendations  None recommended by PT    Recommendations for Other Services       Precautions / Restrictions Precautions Precautions: Fall Precaution Comments: H/o L BKA (prosthetic in room), watch sats    Mobility  Bed Mobility Overal bed mobility: Needs Assistance Bed Mobility: Supine to Sit     Supine to sit: Min assist;HOB elevated     General bed mobility comments: cues for direction,  pt able to bridge minimally, min to end bridge, truncal assist up and forward.  pt scooted to EOB with some effort.    Transfers Overall transfer level: Needs assistance Equipment used: Rolling walker (2 wheeled) Transfers: Sit to/from [facility] Sit to Stand: Min assist;+2 safety/equipment Stand pivot transfers: Min assist;+2 safety/equipment       General transfer comment: cues for hand placement, forward and stability assist  Ambulation/Gait                 Stairs             Wheelchair Mobility    Modified Rankin (Stroke Patients Only)       Balance Overall balance assessment: Needs assistance Sitting-balance support: No upper extremity supported;Feet  supported Sitting balance-Leahy Scale: Fair     Standing balance support: Bilateral upper extremity supported;During functional activity Standing balance-Leahy Scale: Poor Standing balance comment: Reliant on UE support                            Cognition Arousal/Alertness: Awake/alert Behavior During Therapy: WFL for tasks assessed/performed Overall Cognitive Status: Impaired/Different from baseline                   Orientation Level: Time;Situation   Memory: Decreased short-term memory   Safety/Judgement: Decreased awareness of safety;Decreased awareness of deficits   Problem Solving: Slow processing        Exercises Other Exercises Other Exercises: warm up ROM exercise bil knee/hip with graded resistance.    General Comments General comments (skin integrity, edema, etc.): On 2L [place]  SpO2 at 05-97%  HR in the 80's      Pertinent Vitals/Pain Pain Assessment: Faces Faces Pain Scale: No hurt Pain Intervention(s): Monitored during session    Home Living                      Prior Function            PT Goals (current goals can now be found in the care plan section) Acute Rehab PT Goals PT Goal Formulation: With patient Time For Goal Achievement: 01/25/21 Potential to Achieve Goals: Good Progress towards  PT goals: Progressing toward goals    Frequency    Min 3X/week      PT Plan Current plan remains appropriate    Co-evaluation              AM-PAC PT "6 Clicks" Mobility   Outcome Measure  Help needed turning from your back to your side while in a flat bed without using bedrails?: A Little Help needed moving from lying on your back to sitting on the side of a flat bed without using bedrails?: A Lot Help needed moving to and from a bed to a chair (including a wheelchair)?: A Little Help needed standing up from a chair using your arms (e.g., wheelchair or bedside chair)?: A Little Help needed to walk in hospital room?:  A Lot Help needed climbing 3-5 steps with a railing? : Total 6 Click Score: 14    End of Session Equipment Utilized During Treatment: Gait belt;Oxygen Activity Tolerance: Patient tolerated treatment well Patient left: in chair;with call bell/phone within reach;with chair alarm set;with nursing/sitter in room Nurse Communication: Mobility status PT Visit Diagnosis: Other abnormalities of gait and mobility (R26.89);Difficulty in walking, not elsewhere classified (R26.2)     Time: [id number] PT Time Calculation (min) (ACUTE ONLY): 27 min  Charges:  $Therapeutic Activity: 23-37 mins                     01/13/2021  Ginger Carne., PT Acute Rehabilitation Services [phone]  (pager) [phone]  (office)   Tessie Fass Alexxa Sabet 01/13/2021, 4:43 PM

## 2021-01-13 NOTE — Transfer of Care (Signed)
Immediate Anesthesia Transfer of Care Note  Patient: Steven [name]  Procedure(s) Performed: TRANSESOPHAGEAL ECHOCARDIOGRAM (TEE)  Patient Location: Endoscopy Unit  Anesthesia Type:MAC  Level of Consciousness: drowsy and patient cooperative  Airway & Oxygen Therapy: Patient Spontanous Breathing and Patient connected to nasal cannula oxygen  Post-op Assessment: Report given to RN and Post -op Vital signs reviewed and stable  Post vital signs: Reviewed and stable  Last Vitals:  Vitals Value Taken Time  BP 86/37 01/13/21 0910  Temp    Pulse 72 01/13/21 0911  Resp 17 01/13/21 0911  SpO2 100 % 01/13/21 0911  Vitals shown include unvalidated device data.  Last Pain:  Vitals:   01/13/21 0826  TempSrc: Temporal  PainSc: 0-No pain         Complications: No notable events documented.

## 2021-01-13 NOTE — CV Procedure (Signed)
Brief TEE Note  LVEF 60-65% No LA/LAA thrombus or masses Moderate mitral stenosis.  Mean gradient 5 mmHg.  Mostly posterior mitral annular calcification.  There is no evidence of endocarditis on the mitral valve leaflets. TAVR without evidence of endocarditis.  Mean gradient 12 mmHg. Trivial TR.  For additional details see the full report.   Tess Potts C. Oval Linsey, MD, [facility] 01/13/2021 9:07 AM

## 2021-01-13 NOTE — Progress Notes (Signed)
[facility] Progress Note   Assessment/ Plan:   ESRD s/p LRD kidney transplant 15 years ago complicated by AKI/CKD stage IIIb - in setting of decompensated CHF.  [place] with some hydro to transplanted kidney.  Possibly cardiorenal syndrome.  Has nephrotic range proteinuria without overt nephrotic syndrome.   - IR placed tempcath 01/08/21 - on CRRT 6/11- present - volume status improving with aggressive volume removal - stopping CRRT TODAY 01/13/21 - Lasix challenge 100 mg IV x 1 to see if there is any UOP - pt does not want to pursue IHD- palliative care meeting tomorrow --> ? If he would want to do a time- limited trial of IHD to see if he would have renal recovery - SPEP and free light chains pending Acute hypoxic respiratory failure - secondary to recurrent pleural effusions. Acute on chronic CHF - ECHO in 3/22 showed preserved EF with grade II DD and mildly increased gradient across TAVR.  Repeat ECHO with EF 60-65%, mod LVH, Grade II DD, normal RV function   Immunosuppression - continue with home dose of prograf, Myfortic held due to neutropenia.  Tac trough pending Enterococcal Faecalis bacteremia - ID following and managing abx, ampicillin and ceftriaxone.  Getting TEE today. Pancytopenia - Myfortic on hold.  WBC 1.2--> 2.5--> 4.8  Anemia of CKD and iron deficiency - low TSAT s/p feraheme, transfuse prbcs as needed Recurrent bilateral pleural effusions - s/p bilateral PleurX catheters on 01/01/21.  Drain per primary svc. HTN - stable DM - per primary Dispo: in ICU  Subjective:    Seen in room.  Going for TEE.     Objective:   BP (!) 98/42   Pulse 72   Temp 98.2 F (36.8 C) (Oral)   Resp 17   Ht '5\' 11"'$  (1.803 m)   Wt 101 kg   SpO2 100%   BMI 31.06 kg/m   Physical Exam: Gen: lying in bed, awake and conversant CVS: tachycardic Resp: muffled breath sounds bilaterally Abd: some abd wall edema Ext: 1+ RLE edema, s/p BKA on the L with some edema as well,  improving ACCESS: R IJ nontunneled HD cath  Labs: BMET Recent Labs  Lab 01/10/21 0403 01/10/21 1537 01/11/21 0352 01/11/21 1513 01/11/21 2055 01/12/21 0132 01/12/21 1214 01/12/21 1600 01/13/21 0046  NA 137 136 137 136 135 135 137 136 136  K 4.4 5.2* 4.7 5.4* 5.5* 5.6* 4.5 4.7 4.6  CL 104 103 104 98 99 103 99 100 98  CO2 '23 26 27 28 29 '$ 21* '29 28 29  '$ GLUCOSE 61* 222* 136* 287* 256* 232* 202* 195* 175*  BUN 60* 49* 36* 32* 30* 29* '22 22 22  '$ CREATININE 4.20* 3.45* 2.75* 2.43* 2.41* 2.22* 1.97* 1.97* 1.87*  CALCIUM 8.1* 7.8* 8.1* 8.3* 8.0* 8.1* 8.3* 8.1* 8.1*  PHOS 3.5 3.1 2.5 2.2*  --  1.9*  --  1.9* 1.7*   CBC Recent Labs  Lab 01/09/21 0311 01/09/21 1255 01/09/21 1307 01/10/21 0403 01/11/21 1000 01/12/21 0132  WBC 1.2*  --  1.2* 2.5* 3.3* 4.8  NEUTROABS 0.6*  --   --   --   --   --   HGB 6.9*   < > 7.7* 7.9* 7.5* 8.0*  HCT 21.7*   < > 25.0* 24.5* 23.8* 25.7*  MCV 83.1  --  86.2 81.9 84.4 85.4  PLT 90*  --  100* 107* 90* 100*   < > = values in this interval not displayed.  Medications:     aspirin  81 mg Oral Daily   atorvastatin  10 mg Oral Daily   carvedilol  3.125 mg Oral BID WC   Chlorhexidine Gluconate Cloth  6 each Topical Daily   clopidogrel  75 mg Oral Daily   docusate sodium  100 mg Oral Daily   feeding supplement  237 mL Oral TID BM   fenofibrate  160 mg Oral Daily   insulin aspart  0-15 Units Subcutaneous TID WC   insulin aspart  0-5 Units Subcutaneous QHS   insulin glargine  10 Units Subcutaneous QHS   multivitamin with minerals  1 tablet Oral Daily   pantoprazole  40 mg Oral Daily   sodium chloride flush  3 mL Intravenous Q12H   tacrolimus  1 mg Oral BID     Madelon Lips, MD 01/13/2021, 10:03 AM

## 2021-01-13 NOTE — Consult Note (Signed)
Consultation Note Date: 01/13/2021   Patient Name: Steven [name]  DOB: [birth year]  MRN: [id number]  Age / Sex: 65 y.o., male  PCP: Isaac Bliss, Rayford Halsted, MD Referring Physician: Jacky Kindle, MD  Reason for Consultation: Establishing goals of care  HPI/Patient Profile: 65 y.o. 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 Work up revealed pulmonary edema, pleural effusion, and worsening kidney function. He was found to have Enterococcus faecalis bacteremia. TEE ruled out infective endocarditis. Renal failure worsened with signs of uremic encephalopathy. CRRT was initiated. CRRT was stopped morning of 6/15. Patient has expressed he is not interested in long term HD. PMT consulted to discuss [place].   Clinical Assessment and Goals of Care: I have reviewed medical records including EPIC notes, labs and imaging, received report from RN, assessed the patient and then met with patient and his 2 daughters at bedside  to discuss diagnosis prognosis, GOC, EOL wishes, disposition and options.  RN shares that patient is oriented though does have some intermittent confusion. No urine output today.  I introduced Palliative Medicine as specialized medical care for people living with serious illness. It focuses on providing relief from the symptoms and stress of a serious illness. The goal is to improve quality of life for both the patient and the family.  As far as functional and nutritional status, patient tells me of a decline in status prior to hospitalization. Tells me he had difficulty using his prosthetic for ambulation and had become weaker and unstable. Daughters share that appetite had  been very poor.   Daughters share that tomorrow patient's parents and his spouse will be at bedside for family meeting. I describe to them the goals of the meeting: to assess goals most important to patient and decide what medical decisions help meet those goals. Patient expresses an eagerness to hear about options and expectations with each option but he would like to wait for this conversation until other family members can join him.   Questions and concerns were addressed. The family was encouraged to call with questions or concerns.   Primary Decision Maker PATIENT - mental status waxes/wanes - patient's wife if patient unable  SUMMARY OF RECOMMENDATIONS   - initial meeting and introduction to palliative - plans for [place] discussion to include his spouse and parents 6/16 at 10 AM  Code Status/Advance Care Planning: Full code      Primary Diagnoses: Present on Admission: **None**   I have reviewed the medical record, interviewed the patient and family, and examined the patient. The following aspects are pertinent.  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  s/p TAVR with a 26 mm Edwards S3U via the TF approach by Drs Burt Knack & Bartle   Sleep apnea    uses CPAP nightly   Venous insufficiency    Vitamin D deficiency    Social History   Socioeconomic History   Marital status: Married    Spouse name: Not on file   Number of children: Not on  file   Years of education: Not on file   Highest education level: Not on file  Occupational History   Occupation: trooper  Tobacco Use   Smoking status: Never   Smokeless tobacco: Former    Types: Chew    Quit date: 2020  Vaping Use   Vaping Use: Never used  Substance and Sexual Activity   Alcohol use: No   Drug use: No   Sexual activity: Yes  Other Topics Concern   Not on file  Social History Narrative   Not on file   Social Determinants of Health   Financial Resource Strain: Not on file  Food Insecurity: Not on file  Transportation Needs: Not on file  Physical Activity: Not on file  Stress: Not on file  Social Connections: Not on file   Family History  Problem Relation Age of Onset   Healthy Mother    Healthy Father    Colon cancer Neg Hx    Rectal cancer Neg Hx    Stomach cancer Neg Hx    Scheduled Meds:  aspirin  81 mg Oral Daily   atorvastatin  10 mg Oral Daily   carvedilol  3.125 mg Oral BID WC   Chlorhexidine Gluconate Cloth  6 each Topical Daily   clopidogrel  75 mg Oral Daily   docusate sodium  100 mg Oral Daily   feeding supplement  237 mL Oral TID BM   fenofibrate  160 mg Oral Daily   insulin aspart  0-15 Units Subcutaneous TID WC   insulin aspart  0-5 Units Subcutaneous QHS   insulin glargine  10 Units Subcutaneous QHS   multivitamin with minerals  1 tablet Oral Daily   pantoprazole  40 mg Oral Daily   sodium chloride flush  3 mL Intravenous Q12H   tacrolimus  1 mg Oral BID   Continuous Infusions:  sodium chloride Stopped (01/13/21 0657)   sodium chloride 20 mL/hr at 01/13/21 1030   ampicillin (OMNIPEN) IV 2 g (01/13/21 1116)   furosemide     prismasol BGK 2/2.5 replacement solution 1,500 mL/hr at 01/13/21 0508   PRN Meds:.sodium chloride, acetaminophen, cyclobenzaprine, labetalol, lidocaine, ondansetron (ZOFRAN) IV, sodium chloride flush, traMADol, traZODone Allergies  Allergen Reactions   Sulfa Antibiotics Rash   Review of Systems   Constitutional:        C/o of itching at pleurx drain   Physical Exam Constitutional:      General: He is not in acute distress. Pulmonary:     Effort: Pulmonary effort is normal.  Skin:    General: Skin is warm and dry.  Neurological:     Mental Status: He is alert.    Vital Signs: BP (!) (P) 101/44 (BP Location: Left Arm)   Pulse 71   Temp 98.1 F (36.7 C) (Axillary)   Resp (!) 21   Ht '5\' 11"'  (1.803 m)   Wt 101 kg   SpO2 100%   BMI 31.06 kg/m  Pain Scale: 0-10   Pain Score: 0-No pain   SpO2: SpO2: 100 % O2 Device:SpO2: 100 % O2 Flow Rate: .O2 Flow  Rate (L/min): 2 L/min  IO: Intake/output summary:  Intake/Output Summary (Last 24 hours) at 01/13/2021 1419 Last data filed at 01/13/2021 0700 Gross per 24 hour  Intake 979.04 ml  Output 2504 ml  Net -1524.96 ml    LBM: Last BM Date: 01/12/21 Baseline Weight: Weight: 111.1 kg Most recent weight: Weight: 101 kg     Palliative Assessment/Data: PPS 40%    Time Total: 50 minutes Greater than 50%  of this time was spent counseling and coordinating care related to the above assessment and plan.  Juel Burrow, DNP, AGNP-C Palliative Medicine Team [phone] Pager: [phone]

## 2021-01-13 NOTE — Progress Notes (Signed)
RT placed patient on CPAP HS. 3L O2 bleed in needed. Patient tolerating well.

## 2021-01-13 NOTE — Anesthesia Procedure Notes (Signed)
Procedure Name: MAC Date/Time: 01/13/2021 8:49 AM Performed by: Kathryne Hitch, CRNA Pre-anesthesia Checklist: Patient identified, Emergency Drugs available, Suction available and Patient being monitored Patient Re-evaluated:Patient Re-evaluated prior to induction Oxygen Delivery Method: Nasal cannula Preoxygenation: Pre-oxygenation with 100% oxygen Induction Type: IV induction Placement Confirmation: positive ETCO2 Dental Injury: Teeth and Oropharynx as per pre-operative assessment

## 2021-01-13 NOTE — H&P (Signed)
Steven [name] is a 65 y.o. male who has presented today for surgery, with the diagnosis of bacteremia.  The various methods of treatment have been discussed with the patient and family. After consideration of risks, benefits and other options for treatment, the patient has consented to  Procedure(s): TRANSESOPHAGEAL ECHOCARDIOGRAM (TEE) (N/A) as a surgical intervention .  The patient's history has been reviewed, patient examined, no change in status, stable for surgery.  I have reviewed the patient's chart and labs.  Questions were answered to the patient's satisfaction.    Ravin Denardo C. Oval Linsey, MD, [facility]  01/13/2021 8:17 AM

## 2021-01-13 NOTE — Progress Notes (Signed)
  Echocardiogram Echocardiogram Transesophageal has been performed.  Steven [name] 01/13/2021, 9:39 AM

## 2021-01-13 NOTE — Progress Notes (Signed)
NAME:  BENYAM SPROUL, MRN:  [id number], DOB:  23-Feb-1956, LOS: 8 ADMISSION DATE:  01/05/2021, CONSULTATION DATE:  01/13/2021 REFERRING MD:  Jacky Kindle, MD, CHIEF COMPLAINT:  Uremia, renal failure  History of Present Illness:  [name] is a 65 year old gentleman with a history of type 2 diabetes, chronic heart failure preserved ejection fraction, peripheral vascular disease status post left BKA, CKD stage IV of transplanted kidney, aortic stenosis status post TAVR in February 2022 who presents with worsening shortness of breath on 01/09/2021.  Despite successful TAVR in February 2022 he has had worsening shortness of breath and a CT chest showed bilateral pleural effusions and ascites.  He was referred to cardiothoracic surgery as an outpatient for bilateral Pleurx catheter placement in the beginning of June.  Has had thoracentesis large-volume and now large volume drainage from his Pleurx catheters.  He presented to the emergency department on June 7 for worsening shortness of breath.  He was found to have Enterococcus faecalis bacteremia.  His renal failure has progressively worsened and he is now exhibiting signs of uremia with encephalopathy.  PCCM are called to see the patient by Dr. Marval Regal and Dr. Darrick Meigs for transfer to the ICU for continuous renal replacement therapy in the setting of encephalopathy and uremia.   Pertinent  Medical History  Kidney transplant recipient, now with CKD stage IV Type 2 diabetes Severe aortic stenosis status post TAVR Peripheral vascular disease status post left BKA  Significant Hospital Events: Including procedures, antibiotic start and stop dates in addition to other pertinent events   6/3Pleurx catheters placed 6/7 presented to [facility], ED worsening shortness of breath 6/8 blood cultures positive for Enterococcus faecalis 6/10 temporary HD catheter placed 6/11 worsening anasarca an encephalopathy, PCCM consulted 6/13 On CRRT; alert and  oriented  Interim History / Subjective:   Resting on CPAP this morning CRRT just turned off. Patient scheduled for TEE this morning No UOP  Objective   Blood pressure (!) 98/53, pulse 61, temperature 97.6 F (36.4 C), temperature source Axillary, resp. rate 16, height '5\' 11"'$  (1.803 m), weight 101 kg, SpO2 100 %.        Intake/Output Summary (Last 24 hours) at 01/13/2021 0657 Last data filed at 01/13/2021 0600 Gross per 24 hour  Intake 1930.1 ml  Output 6107 ml  Net -4176.9 ml    Filed Weights   01/11/21 0500 01/12/21 0500 01/13/21 0128  Weight: 107.6 kg 103.4 kg 101 kg    Examination: General: NAD; resting on CPAP HEENT: CPAP mask in place Neuro: AOx3 CV: s1s2, RRR, no m/r/g PULM:  dim clear bs bilaterally; on CPAP GI: soft, bsx4 active; non tender Extremities: warm/dry, no edema  Skin: no rashes or lesions   Labs/imaging that I havepersonally reviewed  (right click and "Reselect all SmartList Selections" daily)   Glucose range103-210 Creat 1.87 (2.22), BUN 22 (29) K 4.6, Mag 2.2  CBC pending  Resolved Hospital Problem list     Assessment & Plan:  [name] with CKD stage IV on the transplanted kidney, type 2 diabetes with peripheral vascular disease and left BKA, aortic stenosis status post TAVR who presents with  Acute metabolic encephalopathy, secondary to uremia Stage IV CKD of transplanted kidney, now requiring renal placement therapy Hyperkalemia Plan: -Nephro following: defer to them to continue CVVHD -Family meeting scheduled with palliative care for Thursday (6-16), at 10 am to discuss [place] since patient does not want to be on long term dialysis. -Trend BMP/UO  Enterococcus  faecalis bacteremia Is on ampicillin and ceftriaxone, ID is following Plan: -ID following -continue Ampicillin and Ceftriaxone -TEE scheduled today  Type II diabetes mellitus Plan: -BG range 103-210 -Continue SSI and Lantus -CBG  monitoring  Hypertension Plan: -continue home BP meds  Acute on chronic anemia Pancytopenia: on Tacrolimus for kidney transplant Likely secondary to worsening renal failure in setting of chronic disease Plan: -trend CBC -Transfuse for hgb <7  Hx of OSA?: patient states he wears CPAP at bedtime at home Plan: -CPAP QHS  GOC Plan: -Family meeting scheduled with palliative care for Thursday (6-16), at 10 am to discuss [place] since patient does not want to be on long term dialysis  Best practice (right click and "Reselect all SmartList Selections" daily)  Diet:  Oral Pain/Anxiety/Delirium protocol (if indicated): No VAP protocol (if indicated): Not indicated DVT prophylaxis: Subcutaneous Heparin GI prophylaxis: PPI Glucose control:  SSI Yes and Basal insulin Yes Central venous access:  Yes, and it is still needed Arterial line:  N/A Foley:  N/A Mobility:  OOB  PT consulted: N/A Last date of multidisciplinary goals of care discussion [pending family conversation on 6/16 at 88 am] Code Status:  full code Disposition: ICU   This patient is critically ill with multiple organ system failure; which, requires frequent high complexity decision making, assessment, support, evaluation, and titration of therapies. This was completed through the application of advanced monitoring technologies and extensive interpretation of multiple databases. During this encounter critical care time was devoted to patient care services described in this note for 35 minutes.  JD Rexene Agent [facility] 01/13/2021, 6:57 AM  Please see [email or web address] for pager details.  From 7A-7P if no response, please call [phone]. After hours, please call ELink [phone].

## 2021-01-13 NOTE — Progress Notes (Signed)
[facility] for Infectious Disease  Date of Admission:  01/05/2021   Total days of antibiotics 9         ASSESSMENT: Patient is clinically stable.  Afebrile overnight.  TEE was performed today which was negative for endocarditis on the mitral valve or TAVR.  Will discontinue ceftriaxone and continue IV ampicillin for 2 weeks after first negative blood culture on 6/9.  Patient currently having HD catheter for dialysis.  Palliative care was consulted and will have family meeting tomorrow because patient does not want pursue HD.  If the decision is to not have dialysis, will place a PICC line and continue with IV ampicillin for 2 weeks.  If patient decides to pursue dialysis, will change to IV vancomycin which can be given through HD catheter.  End date 01/21/2021  PLAN: Stop ceftriaxone IV ampicillin for 2 weeks.  Pending palliative family meeting tomorrow  Active Problems:   AKI (acute kidney injury) ([facility])   Acute on chronic diastolic CHF (congestive heart failure) (HCC)   CHF (congestive heart failure) (HCC)   Pressure injury of skin   Bacteremia   Scheduled Meds:  aspirin  81 mg Oral Daily   atorvastatin  10 mg Oral Daily   carvedilol  3.125 mg Oral BID WC   Chlorhexidine Gluconate Cloth  6 each Topical Daily   clopidogrel  75 mg Oral Daily   docusate sodium  100 mg Oral Daily   feeding supplement  237 mL Oral TID BM   fenofibrate  160 mg Oral Daily   insulin aspart  0-15 Units Subcutaneous TID WC   insulin aspart  0-5 Units Subcutaneous QHS   insulin glargine  10 Units Subcutaneous QHS   multivitamin with minerals  1 tablet Oral Daily   pantoprazole  40 mg Oral Daily   sodium chloride flush  3 mL Intravenous Q12H   tacrolimus  1 mg Oral BID   Continuous Infusions:  sodium chloride Stopped (01/13/21 0657)   sodium chloride     ampicillin (OMNIPEN) IV Stopped (01/13/21 0124)   furosemide     prismasol BGK 2/2.5 replacement solution 1,500 mL/hr at 01/13/21  0508   PRN Meds:.sodium chloride, acetaminophen, cyclobenzaprine, labetalol, lidocaine, ondansetron (ZOFRAN) IV, sodium chloride flush, traMADol, traZODone   SUBJECTIVE: Patient is seen at bedside.  Appears in no acute distress.  States that he is feeling fine.  No acute complaints.  Review of Systems: ROS Per HPI  Allergies  Allergen Reactions   Sulfa Antibiotics Rash    OBJECTIVE: Vitals:   01/13/21 0826 01/13/21 0910 01/13/21 0923 01/13/21 1032  BP: (!) 111/38 (!) 86/37 (!) 98/42 (!) 99/50  Pulse: 69 72 72 72  Resp: '19 17 17 15  '$ Temp: 97.9 F (36.6 C) 98.2 F (36.8 C)    TempSrc: Temporal Oral    SpO2: 100% 100% 100% 100%  Weight: 101 kg     Height: '5\' 11"'$  (1.803 m)      Body mass index is 31.06 kg/m.  Physical Exam Constitutional:      General: He is not in acute distress.    Appearance: He is not toxic-appearing.  HENT:     Head: Normocephalic.  Eyes:     General: No scleral icterus.       Right eye: No discharge.        Left eye: No discharge.     Conjunctiva/sclera: Conjunctivae normal.  Cardiovascular:     Rate and Rhythm:  Normal rate and regular rhythm.  Pulmonary:     Effort: Pulmonary effort is normal. No respiratory distress.  Skin:    General: Skin is warm.  Neurological:     Mental Status: He is alert.  Psychiatric:        Mood and Affect: Mood normal.        Thought Content: Thought content normal.        Judgment: Judgment normal.    Lab Results Lab Results  Component Value Date   WBC 4.8 01/12/2021   HGB 8.0 (L) 01/12/2021   HCT 25.7 (L) 01/12/2021   MCV 85.4 01/12/2021   PLT 100 (L) 01/12/2021    Lab Results  Component Value Date   CREATININE 1.87 (H) 01/13/2021   BUN 22 01/13/2021   NA 136 01/13/2021   K 4.6 01/13/2021   CL 98 01/13/2021   CO2 29 01/13/2021    Lab Results  Component Value Date   ALT 15 01/07/2021   AST 21 01/07/2021   ALKPHOS 92 01/07/2021   BILITOT 0.6 01/07/2021     Microbiology: Recent Results  (from the past 240 hour(s))  Culture, blood (x 2)     Status: Abnormal   Collection Time: 01/05/21  4:36 PM   Specimen: BLOOD LEFT FOREARM  Result Value Ref Range Status   Specimen Description BLOOD LEFT FOREARM  Final   Special Requests   Final    BOTTLES DRAWN AEROBIC AND ANAEROBIC Blood Culture adequate volume   Culture  Setup Time   Final    GRAM POSITIVE COCCI IN BOTH AEROBIC AND ANAEROBIC BOTTLES CRITICAL RESULT CALLED TO, READ BACK BY AND VERIFIED WITH: PHARMD JEREMY FRENS AT [id number] [place] ON 01/06/21 BY KJ Performed at [facility], [place] [address], [place], [place] [address]    Culture ENTEROCOCCUS FAECALIS (A)  Final   Report Status 01/08/2021 FINAL  Final   Organism ID, Bacteria ENTEROCOCCUS FAECALIS  Final      Susceptibility   Enterococcus faecalis - MIC*    AMPICILLIN <=2 SENSITIVE Sensitive     VANCOMYCIN 1 SENSITIVE Sensitive     GENTAMICIN SYNERGY SENSITIVE Sensitive     * ENTEROCOCCUS FAECALIS  Blood Culture ID Panel (Reflexed)     Status: Abnormal   Collection Time: 01/05/21  4:36 PM  Result Value Ref Range Status   Enterococcus faecalis DETECTED (A) NOT DETECTED Final    Comment: CRITICAL RESULT CALLED TO, READ BACK BY AND VERIFIED WITH: PHARMD JEREMY FRENS AT [id number] ON 6/85/22 BY KJ    Enterococcus Faecium NOT DETECTED NOT DETECTED Corrected    Comment: CORRECTED ON 06/08 AT 1234: PREVIOUSLY REPORTED AS NOT DETECTED CRITICAL RESULT CALLED TO, READ BACK BY AND VERIFIED WITH: PHARMD JEREMY FRENS AT [id number] ON 6/85/22 BY KJ   Listeria monocytogenes NOT DETECTED NOT DETECTED Final   Staphylococcus species NOT DETECTED NOT DETECTED Final   Staphylococcus aureus (BCID) NOT DETECTED NOT DETECTED Final   Staphylococcus epidermidis NOT DETECTED NOT DETECTED Final   Staphylococcus lugdunensis NOT DETECTED NOT DETECTED Final   Streptococcus species NOT DETECTED NOT DETECTED Final   Streptococcus agalactiae NOT DETECTED NOT DETECTED Final   Streptococcus pneumoniae NOT  DETECTED NOT DETECTED Final   Streptococcus pyogenes NOT DETECTED NOT DETECTED Final   A.calcoaceticus-baumannii NOT DETECTED NOT DETECTED Final   Bacteroides fragilis NOT DETECTED NOT DETECTED Final   Enterobacterales NOT DETECTED NOT DETECTED Final   Enterobacter cloacae complex NOT DETECTED NOT DETECTED Final   Escherichia  coli NOT DETECTED NOT DETECTED Final   Klebsiella aerogenes NOT DETECTED NOT DETECTED Final   Klebsiella oxytoca NOT DETECTED NOT DETECTED Final   Klebsiella pneumoniae NOT DETECTED NOT DETECTED Final   Proteus species NOT DETECTED NOT DETECTED Final   Salmonella species NOT DETECTED NOT DETECTED Final   Serratia marcescens NOT DETECTED NOT DETECTED Final   Haemophilus influenzae NOT DETECTED NOT DETECTED Final   Neisseria meningitidis NOT DETECTED NOT DETECTED Final   Pseudomonas aeruginosa NOT DETECTED NOT DETECTED Final   Stenotrophomonas maltophilia NOT DETECTED NOT DETECTED Final   Candida albicans NOT DETECTED NOT DETECTED Final   Candida auris NOT DETECTED NOT DETECTED Final   Candida glabrata NOT DETECTED NOT DETECTED Final   Candida krusei NOT DETECTED NOT DETECTED Final   Candida parapsilosis NOT DETECTED NOT DETECTED Final   Candida tropicalis NOT DETECTED NOT DETECTED Final   Cryptococcus neoformans/gattii NOT DETECTED NOT DETECTED Final   Vancomycin resistance NOT DETECTED NOT DETECTED Final    Comment: Performed at [facility], [place] [address] [place], [place] [address]  Culture, blood (x 2)     Status: None   Collection Time: 01/05/21  7:26 PM   Specimen: BLOOD  Result Value Ref Range Status   Specimen Description BLOOD SITE NOT SPECIFIED  Final   Special Requests   Final    BOTTLES DRAWN AEROBIC AND ANAEROBIC Blood Culture results may not be optimal due to an inadequate volume of blood received in culture bottles   Culture   Final    NO GROWTH 5 DAYS Performed at [facility], [place] [address] [place], [place] [address]    Report  Status 01/10/2021 FINAL  Final  Resp Panel by RT-PCR (Flu A&B, Covid) Nasopharyngeal Swab     Status: None   Collection Time: 01/05/21  8:57 PM   Specimen: Nasopharyngeal Swab; Nasopharyngeal(NP) swabs in vial transport medium  Result Value Ref Range Status   SARS Coronavirus 2 by RT PCR NEGATIVE NEGATIVE Final    Comment: (NOTE) SARS-CoV-2 target nucleic acids are NOT DETECTED.  The SARS-CoV-2 RNA is generally detectable in upper respiratory specimens during the acute phase of infection. The lowest concentration of SARS-CoV-2 viral copies this assay can detect is 138 copies/mL. A negative result does not preclude SARS-Cov-2 infection and should not be used as the sole basis for treatment or other patient management decisions. A negative result may occur with  improper specimen collection/handling, submission of specimen other than nasopharyngeal swab, presence of viral mutation(s) within the areas targeted by this assay, and inadequate number of viral copies(<138 copies/mL). A negative result must be combined with clinical observations, patient history, and epidemiological information. The expected result is Negative.  Fact Sheet for Patients:  EntrepreneurPulse.com.au  Fact Sheet for Healthcare Providers:  IncredibleEmployment.be  This test is no t yet approved or cleared by the [place] FDA and  has been authorized for detection and/or diagnosis of SARS-CoV-2 by FDA under an Emergency Use Authorization (EUA). This EUA will remain  in effect (meaning this test can be used) for the duration of the COVID-19 declaration under Section 564(b)(1) of the Act, 21 U.S.C.section 360bbb-3(b)(1), unless the authorization is terminated  or revoked sooner.       Influenza A by PCR NEGATIVE NEGATIVE Final   Influenza B by PCR NEGATIVE NEGATIVE Final    Comment: (NOTE) The Xpert Xpress SARS-CoV-2/FLU/RSV plus assay is intended as an aid in the  diagnosis of influenza from Nasopharyngeal swab  specimens and should not be used as a sole basis for treatment. Nasal washings and aspirates are unacceptable for Xpert Xpress SARS-CoV-2/FLU/RSV testing.  Fact Sheet for Patients: EntrepreneurPulse.com.au  Fact Sheet for Healthcare Providers: IncredibleEmployment.be  This test is not yet approved or cleared by the [place] FDA and has been authorized for detection and/or diagnosis of SARS-CoV-2 by FDA under an Emergency Use Authorization (EUA). This EUA will remain in effect (meaning this test can be used) for the duration of the COVID-19 declaration under Section 564(b)(1) of the Act, 21 U.S.C. section 360bbb-3(b)(1), unless the authorization is terminated or revoked.  Performed at [facility], [place] [address] [place], [place] [address]   Respiratory (~20 pathogens) panel by PCR     Status: None   Collection Time: 01/05/21 11:26 PM  Result Value Ref Range Status   Adenovirus NOT DETECTED NOT DETECTED Final   Coronavirus 229E NOT DETECTED NOT DETECTED Final    Comment: (NOTE) The Coronavirus on the Respiratory Panel, DOES NOT test for the novel  Coronavirus (2019 nCoV)    Coronavirus HKU1 NOT DETECTED NOT DETECTED Final   Coronavirus NL63 NOT DETECTED NOT DETECTED Final   Coronavirus OC43 NOT DETECTED NOT DETECTED Final   Metapneumovirus NOT DETECTED NOT DETECTED Final   Rhinovirus / Enterovirus NOT DETECTED NOT DETECTED Final   Influenza A NOT DETECTED NOT DETECTED Final   Influenza B NOT DETECTED NOT DETECTED Final   Parainfluenza Virus 1 NOT DETECTED NOT DETECTED Final   Parainfluenza Virus 2 NOT DETECTED NOT DETECTED Final   Parainfluenza Virus 3 NOT DETECTED NOT DETECTED Final   Parainfluenza Virus 4 NOT DETECTED NOT DETECTED Final   Respiratory Syncytial Virus NOT DETECTED NOT DETECTED Final   Bordetella pertussis NOT DETECTED NOT DETECTED Final   Bordetella Parapertussis  NOT DETECTED NOT DETECTED Final   Chlamydophila pneumoniae NOT DETECTED NOT DETECTED Final   Mycoplasma pneumoniae NOT DETECTED NOT DETECTED Final    Comment: Performed at [facility], [place]. [address], [place], [place] [address]  Culture, blood (routine x 2)     Status: None   Collection Time: 01/07/21  5:27 AM   Specimen: BLOOD  Result Value Ref Range Status   Specimen Description BLOOD RIGHT ANTECUBITAL  Final   Special Requests   Final    BOTTLES DRAWN AEROBIC AND ANAEROBIC Blood Culture adequate volume   Culture   Final    NO GROWTH 5 DAYS Performed at [facility], [place] [address], [place], [place] [address]    Report Status 01/12/2021 FINAL  Final  Culture, blood (routine x 2)     Status: None   Collection Time: 01/07/21  5:27 AM   Specimen: BLOOD RIGHT HAND  Result Value Ref Range Status   Specimen Description BLOOD RIGHT HAND  Final   Special Requests   Final    BOTTLES DRAWN AEROBIC AND ANAEROBIC Blood Culture adequate volume   Culture   Final    NO GROWTH 5 DAYS Performed at [facility], [place] [address] [place], [place] [address]    Report Status 01/12/2021 FINAL  Final  MRSA Next Gen by PCR, Nasal     Status: None   Collection Time: 01/09/21  1:32 PM  Result Value Ref Range Status   MRSA by PCR Next Gen NOT DETECTED NOT DETECTED Final    Comment: (NOTE) The GeneXpert MRSA Assay (FDA approved for NASAL specimens only), is one component of a comprehensive MRSA  colonization surveillance program. It is not intended to diagnose MRSA infection nor to guide or monitor treatment for MRSA infections. Test performance is not FDA approved in patients less than 36 years old. Performed at [facility], [place] [address] [place], [place] [address]     Gaylan Gerold, [place] [facility] [phone] pager   [phone] cell 01/13/2021, 10:43 AM

## 2021-01-14 DIAGNOSIS — N186 End stage renal disease: Secondary | ICD-10-CM

## 2021-01-14 DIAGNOSIS — Z66 Do not resuscitate: Secondary | ICD-10-CM

## 2021-01-14 LAB — RENAL FUNCTION PANEL
Albumin: 2.4 g/dL — ABNORMAL LOW (ref 3.5–5.0)
Anion gap: 6 (ref 5–15)
BUN: 31 mg/dL — ABNORMAL HIGH (ref 8–23)
CO2: 27 mmol/L (ref 22–32)
Calcium: 7.9 mg/dL — ABNORMAL LOW (ref 8.9–10.3)
Chloride: 103 mmol/L (ref 98–111)
Creatinine, Ser: 3.05 mg/dL — ABNORMAL HIGH (ref 0.61–1.24)
GFR, Estimated: 22 mL/min — ABNORMAL LOW (ref >60)
Glucose, Bld: 161 mg/dL — ABNORMAL HIGH (ref 70–99)
Phosphorus: 2.2 mg/dL — ABNORMAL LOW (ref 2.5–4.6)
Potassium: 4.5 mmol/L (ref 3.5–5.1)
Sodium: 136 mmol/L (ref 135–145)

## 2021-01-14 LAB — CBC
HCT: 21.2 % — ABNORMAL LOW (ref 39.0–52.0)
Hemoglobin: 6.5 g/dL — CL (ref 13.0–17.0)
MCH: 26.9 pg (ref 26.0–34.0)
MCHC: 30.7 g/dL (ref 30.0–36.0)
MCV: 87.6 fL (ref 80.0–100.0)
Platelets: 88 10*3/uL — ABNORMAL LOW (ref 150–400)
RBC: 2.42 MIL/uL — ABNORMAL LOW (ref 4.22–5.81)
RDW: 17.3 % — ABNORMAL HIGH (ref 11.5–15.5)
WBC: 2.9 10*3/uL — ABNORMAL LOW (ref 4.0–10.5)
nRBC: 0 % (ref 0.0–0.2)

## 2021-01-14 LAB — GLUCOSE, CAPILLARY
Glucose-Capillary: 73 mg/dL (ref 70–99)
Glucose-Capillary: 76 mg/dL (ref 70–99)
Glucose-Capillary: 136 mg/dL — ABNORMAL HIGH (ref 70–99)
Glucose-Capillary: 107 mg/dL — ABNORMAL HIGH (ref 70–99)

## 2021-01-14 LAB — PREPARE RBC (CROSSMATCH)

## 2021-01-14 MED ORDER — ASPIRIN 81 MG PO CHEW: 81.0000 mg | CHEWABLE_TABLET | Freq: Every day | ORAL | Status: AC

## 2021-01-14 MED ORDER — CARVEDILOL 3.125 MG PO TABS
3.1250 mg | ORAL_TABLET | Freq: Two times a day (BID) | ORAL | Status: AC
Start: 2021-01-14 — End: ?

## 2021-01-14 MED ORDER — SODIUM CHLORIDE 0.9% IV SOLUTION: Freq: Once | INTRAVENOUS | Status: DC

## 2021-01-14 MED ORDER — SODIUM CHLORIDE 0.9% FLUSH: 3.0000 mL | Freq: Two times a day (BID) | INTRAVENOUS | Status: AC

## 2021-01-14 MED ORDER — ENSURE ENLIVE PO LIQD: 237.0000 mL | Freq: Three times a day (TID) | ORAL | 12 refills | Status: AC

## 2021-01-14 MED ORDER — SODIUM CHLORIDE 0.9 % IV SOLN
2.0000 g | Freq: Two times a day (BID) | INTRAVENOUS | Status: DC
  Administered 2021-01-14: 2 g via INTRAVENOUS
  Filled 2021-01-14 (×2): qty 2000

## 2021-01-14 MED ORDER — SODIUM CHLORIDE 0.9% FLUSH: 3.0000 mL | INTRAVENOUS | Status: AC | PRN

## 2021-01-14 MED ORDER — FUROSEMIDE 20 MG PO TABS: 80.0000 mg | ORAL_TABLET | Freq: Every day | ORAL | 11 refills | Status: AC

## 2021-01-14 MED ORDER — ACETAMINOPHEN 325 MG PO TABS: 650.0000 mg | ORAL_TABLET | ORAL | Status: AC | PRN

## 2021-01-14 MED ORDER — INSULIN ASPART 100 UNIT/ML IJ SOLN: 0.0000 [IU] | Freq: Three times a day (TID) | INTRAMUSCULAR | 11 refills | Status: AC

## 2021-01-14 MED ORDER — ADULT MULTIVITAMIN W/MINERALS CH: 1.0000 | ORAL_TABLET | Freq: Every day | ORAL | Status: AC

## 2021-01-14 NOTE — Progress Notes (Signed)
[facility] Progress Note   Assessment/ Plan:   ESRD s/p LRD kidney transplant 15 years ago complicated by AKI/CKD stage IIIb - in setting of decompensated CHF.  [place] with some hydro to transplanted kidney.  Possibly cardiorenal syndrome.  Has nephrotic range proteinuria without overt nephrotic syndrome.   - IR placed tempcath 01/08/21 - on CRRT 6/11- 01/13/21 - no UOP to Lasix challenge 01/13/21 - initially pt said no IHD- seems to be considering per discussions with pt today- discussed with him my observations- do not see renal recovery just yet, would need IHD if he were to pursue all aggressive measures.  Sister in room this AM with him, they will discuss privately.   - SPEP and free light chains pending  Acute hypoxic respiratory failure - secondary to recurrent pleural effusions.  Acute on chronic CHF - ECHO in 3/22 showed preserved EF with grade II DD and mildly increased gradient across TAVR.  Repeat ECHO with EF 60-65%, mod LVH, Grade II DD, normal RV function  Immunosuppression - continue with home dose of prograf, Myfortic held due to neutropenia.  Tac trough 5.9, OK  Enterococcal Faecalis bacteremia - ID following and managing abx, ampicillin and ceftriaxone.  TEE 01/13/21 with no endocarditis and well-seated TAVR.  Pancytopenia - Myfortic on hold.  WBC 1.2--> 2.5--> 4.8   Anemia of CKD and iron deficiency - low TSAT s/p feraheme, transfuse prbcs as needed  Recurrent bilateral pleural effusions - s/p bilateral PleurX catheters on 01/01/21.  Drain per primary svc.  HTN - stable  DM - per primary  Dispo: in ICU  Subjective:    TEE negative for endocarditis.  Hbg 6.6 this AM. Getting 1 u pRBCs.  Per reports, fam meeting today.   Objective:   BP (!) 119/58   Pulse 75   Temp 98.4 F (36.9 C) (Oral)   Resp (!) 25   Ht '5\' 11"'$  (1.803 m)   Wt 100.8 kg   SpO2 100%   BMI 30.99 kg/m   Physical Exam: Gen: lying in bed, awake and conversant CVS:  tachycardic Resp: muffled breath sounds bilaterally Abd: some abd wall edema Ext: 1+ RLE edema, s/p BKA on the L with some edema as well, improving ACCESS: R IJ nontunneled HD cath  Labs: BMET Recent Labs  Lab 01/10/21 1537 01/11/21 0352 01/11/21 1513 01/11/21 2055 01/12/21 0132 01/12/21 1214 01/12/21 1600 01/13/21 0046 01/14/21 0051  NA 136 137 136 135 135 137 136 136 136  K 5.2* 4.7 5.4* 5.5* 5.6* 4.5 4.7 4.6 4.5  CL 103 104 98 99 103 99 100 98 103  CO2 '26 27 28 29 '$ 21* '29 28 29 27  '$ GLUCOSE 222* 136* 287* 256* 232* 202* 195* 175* 161*  BUN 49* 36* 32* 30* 29* '22 22 22 '$ 31*  CREATININE 3.45* 2.75* 2.43* 2.41* 2.22* 1.97* 1.97* 1.87* 3.05*  CALCIUM 7.8* 8.1* 8.3* 8.0* 8.1* 8.3* 8.1* 8.1* 7.9*  PHOS 3.1 2.5 2.2*  --  1.9*  --  1.9* 1.7* 2.2*   CBC Recent Labs  Lab 01/09/21 0311 01/09/21 1255 01/11/21 1000 01/12/21 0132 01/13/21 1050 01/14/21 0433  WBC 1.2*   < > 3.3* 4.8 3.4* 2.9*  NEUTROABS 0.6*  --   --   --   --   --   HGB 6.9*   < > 7.5* 8.0* 7.1* 6.5*  HCT 21.7*   < > 23.8* 25.7* 22.4* 21.2*  MCV 83.1   < > 84.4 85.4 85.5 87.6  PLT 90*   < > 90* 100* 99* 88*   < > = values in this interval not displayed.      Medications:     sodium chloride   Intravenous Once   aspirin  81 mg Oral Daily   atorvastatin  10 mg Oral Daily   carvedilol  3.125 mg Oral BID WC   Chlorhexidine Gluconate Cloth  6 each Topical Daily   clopidogrel  75 mg Oral Daily   docusate sodium  100 mg Oral Daily   feeding supplement  237 mL Oral TID BM   fenofibrate  160 mg Oral Daily   insulin aspart  0-15 Units Subcutaneous TID WC   insulin aspart  0-5 Units Subcutaneous QHS   insulin glargine  10 Units Subcutaneous QHS   multivitamin with minerals  1 tablet Oral Daily   pantoprazole  40 mg Oral Daily   sodium chloride flush  3 mL Intravenous Q12H   tacrolimus  1 mg Oral BID     Madelon Lips, MD 01/14/2021, 9:01 AM

## 2021-01-14 NOTE — Progress Notes (Signed)
eLink Physician-Brief Progress Note Patient Name: Steven [name] DOB: 07/08/1956 MRN: [id number]   Date of Service  01/14/2021  HPI/Events of Note  Anemia - Hgb = 6.5.   eICU Interventions  Will transfuse 1 unit PRBC now.      Intervention Category Major Interventions: Other:  Lysle Dingwall 01/14/2021, 5:23 AM

## 2021-01-14 NOTE — Progress Notes (Addendum)
NAME:  Steven [name], MRN:  [id number], DOB:  09-06-1955, LOS: 5 ADMISSION DATE:  01/05/2021, CONSULTATION DATE:  01/14/2021 REFERRING MD:  Jacky Kindle, MD, CHIEF COMPLAINT:  Uremia, renal failure  History of Present Illness:  Steven [name] is a 65 year old gentleman with a history of type 2 diabetes, chronic heart failure preserved ejection fraction, peripheral vascular disease status post left BKA, CKD stage IV of transplanted kidney, aortic stenosis status post TAVR in February 2022 who presents with worsening shortness of breath on 01/09/2021.  Despite successful TAVR in February 2022 he has had worsening shortness of breath and a CT chest showed bilateral pleural effusions and ascites.  He was referred to cardiothoracic surgery as an outpatient for bilateral Pleurx catheter placement in the beginning of June.  Has had thoracentesis large-volume and now large volume drainage from his Pleurx catheters.  He presented to the emergency department on June 7 for worsening shortness of breath.  He was found to have Enterococcus faecalis bacteremia.  His renal failure has progressively worsened and he is now exhibiting signs of uremia with encephalopathy.  PCCM are called to see the patient by Dr. Marval Regal and Dr. Darrick Meigs for transfer to the ICU for continuous renal replacement therapy in the setting of encephalopathy and uremia.   Pertinent  Medical History  Kidney transplant recipient, now with CKD stage IV Type 2 diabetes Severe aortic stenosis status post TAVR Peripheral vascular disease status post left BKA  Significant Hospital Events: Including procedures, antibiotic start and stop dates in addition to other pertinent events   6/3Pleurx catheters placed 6/7 presented to [facility], ED worsening shortness of breath 6/8 blood cultures positive for Enterococcus faecalis 6/10 temporary HD catheter placed 6/11 worsening anasarca an encephalopathy, PCCM consulted 6/13 On CRRT; alert and  oriented 6/15 CRRT stopped. Pt and family not wanting to pursue iHD. Planning for [employer] meeting. TEE LVEF 60-65% mod MVS. No evidence of endocarditis. ID recommending Monotherapy w/ Ampicillin thri 6/23 then dc. If he ends up being discharged and decision changes about iHD alternative rx would be Vanc on iHD days. Palliative consulted.  6/16 got 1 U PRBC for hgb 6.5  Interim History / Subjective:  Denies SOB or chest pain   Objective   Blood pressure (Abnormal) 103/51, pulse 71, temperature 97.6 F (36.4 C), temperature source Axillary, resp. rate (Abnormal) 21, height '5\' 11"'$  (1.803 m), weight 100.8 kg, SpO2 100 %.        Intake/Output Summary (Last 24 hours) at 01/14/2021 0739 Last data filed at 01/14/2021 0200 Gross per 24 hour  Intake 241.24 ml  Output no documentation  Net 241.24 ml   Filed Weights   01/13/21 0128 01/13/21 0826 01/14/21 0500  Weight: 101 kg 101 kg 100.8 kg    Examination:  General: This is a very pleasant 65 year old male he is resting in bed and in no acute distress this morning HEENT normocephalic atraumatic no JVD mucous membranes moist Pulmonary: Diminished both bases, currently nasal cannula oxygen.  No accessory use.  Has bilateral Pleurx tubes dressings are intact Cardiac: Regular rate and rhythm Abdomen: Soft not tender no organomegaly Extremities: Warm dry brisk cap refill Neuro: Awake interactive no distress following commands GU: Minimal urine from bladder scan  Labs/imaging that I havepersonally reviewed  (right click and "Reselect all SmartList Selections" daily)   See below   Resolved Hospital Problem list   Hyperkalemia  Assessment & Plan:  Steven [name] with CKD stage IV on the transplanted kidney,  type 2 diabetes with peripheral vascular disease and left BKA, aortic stenosis status post TAVR who presents with  Acute metabolic encephalopathy, secondary to uremia Stage IV CKD of transplanted kidney, now requiring renal placement  therapy -family indicating he would not want to pursue long term iHD. Has been off Crrt since yesterday.  Plan GOC discussion today Supportive care  Chronic pleural effusions from volume overload and heart failure Plan Continue every other day pleural drainage  Enterococcus faecalis bacteremia - TEE neg for vegetation  Plan: Amp thru 6/23  (see above)  Type II diabetes mellitus Plan: ssi   Hypertension Plan: Cont home meds   Acute on chronic anemia Pancytopenia: on Tacrolimus for kidney transplant Likely secondary to worsening renal failure in setting of chronic disease Plan: Trend cbc Transfuse for hgb < 7 UNLESS we decide full comfort at which time will not transfuse further   Hx of OSA?: patient states he wears CPAP at bedtime at home Plan: CPAP   GOC Plan: Family meeting today   Best Practice (right click and "Reselect all SmartList Selections" daily)   Diet/type: Regular consistency (see orders) Pain/Anxiety/Delirium protocol Not indicated VAP protocol (if indicated): Not indicated DVT prophylaxis: SCD GI prophylaxis: PPI Glucose control:  SSI and Basal coverage Central venous access:  Yes, and it is still needed Arterial line:  N/A Foley:  N/A Mobility:  bed rest  PT consulted: N/A Studies pending: None Culture data pending:none Last reviewed culture data:today Antibiotics:otherampicillin  Antibiotic de-escalation: no,  continue current rx Stop date: ordered Daily labs: requested Code Status:  full code Last date of multidisciplinary goals of care discussion [plan for today 6/16] Life-threating Disposition: remains critically ill, will stay in intensive care    My cct 32 min  Erick Colace ACNP-BC [place] Pager # [phone] OR # [phone] if no answer

## 2021-01-14 NOTE — Care Plan (Signed)
Hospitalist Transfer Accept Note  65 y.o. M with ESRD s/p transplant, back to CKD IV, dCHF with chronic effusions and PleurX who p/w multiorgan failure from enterococcal bacteremia.  Admitted to ICU and started on CRRT, after discussion of goals, decision made not to pursue intermittent HD, and will transition to Hospice, likely residential.  Referral made, to hospitalist service on 6/17.  We will take care of Steven [name].

## 2021-01-14 NOTE — Progress Notes (Signed)
Nutrition Brief Note  Chart reviewed.Palliative care note reviewed.  Pt is a DNR and focus is on comfort with no further aggressive measures. No HD. Plan to transfer to hospice facility with bed available. Liberalized diet to Regular secondary to [place]. No further nutrition interventions planned at this time.  Please re-consult as needed.    Kerman Passey MS, RDN, LDN, CNSC Registered Dietitian III Clinical Nutrition RD Pager and On-Call Pager Number Located in [place]

## 2021-01-14 NOTE — TOC Transition Note (Signed)
Transition of Care [facility]) - CM/SW Discharge Note   Patient Details  Name: Steven [name] MRN: [id number] Date of Birth: 10-12-1955  Transition [facility]) CM/SW Contact:  Trula Ore, [place] Phone Number: 01/14/2021, 4:08 PM   Clinical Narrative:     Patient will DC to: [facility]   Anticipated DC date: 01/14/2021  Family notified: Wilma  Transport by: Corey Harold  ?  Per MD patient ready for DC to [facility] . RN, patient, patient's family, and facility notified of DC. Discharge Summary sent to facility. RN given number for report tele#[phone]. DC packet on chart. DNR signed by MD attached to patients dc packet.Ambulance transport requested for patient.  CSW signing off.   Final next level of care: [place] ([place]) Barriers to Discharge: No Barriers Identified   Patient Goals and CMS Choice   CMS Medicare.gov Compare Post Acute Care list provided to:: Patient Represenative (must comment) [name] patients spouse) Choice offered to / list presented to : Spouse [name])  Discharge Placement              Patient chooses bed at:  [facility]) Patient to be transferred to facility by: [facility] Name of family member notified: [name] Patient and family notified of of transfer: 01/14/21  Discharge Plan and Services                                     Social Determinants of Health (SDOH) Interventions     Readmission Risk Interventions No flowsheet data found.

## 2021-01-14 NOTE — Discharge Summary (Signed)
Physician Discharge Summary         Patient ID: SNOWDEN FAFARD MRN: [id number] DOB/AGE: 01/20/1956 65 y.o.  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 is a 64 year old gentleman with a history of type 2 diabetes, chronic heart failure preserved ejection fraction, peripheral vascular disease status post left BKA, CKD stage IV of transplanted kidney, aortic stenosis status post TAVR in February 2022 who presents with worsening shortness of breath on 01/09/2021.  Despite successful TAVR in February 2022 he has had worsening shortness of breath and a CT chest showed bilateral pleural effusions and ascites.  He was referred to cardiothoracic surgery as an outpatient for bilateral Pleurx catheter placement in the beginning of June.  Has had thoracentesis large-volume and now large volume drainage from his Pleurx catheters.  He presented to the emergency department on June 7 for worsening shortness of breath.  He was found to have Enterococcus faecalis bacteremia.  His renal failure has progressively worsened and he is now exhibiting signs of uremia with encephalopathy.  PCCM are called to see the patient by Dr. Marval Regal and Dr. Darrick Meigs for transfer to the ICU for continuous renal replacement therapy in the setting of encephalopathy and uremia.   Significant Hospital Events: Including procedures, antibiotic start and stop dates in addition to other  pertinent events  6/3 Pleurx catheters placed 6/7 presented to [facility], ED worsening shortness of breath. Given cefepime IV 6/8 blood cultures positive for Enterococcus faecalis. Abx changed to ceftriaxone and ampicillin 6/10 temporary HD catheter placed 6/11 worsening anasarca an encephalopathy, PCCM consulted 6/13 On CRRT; alert and oriented 6/15 CRRT stopped. Pt and family not wanting to pursue iHD. Planning for [place] meeting. TEE LVEF 60-65% mod MVS. No evidence of endocarditis. ID recommending Monotherapy w/ Ampicillin thri 6/23 then dc. If he ends up being discharged and decision changes about iHD alternative rx would be Vanc on iHD days. Palliative consulted. Ceftriaxone stopped 6/16 got 1 U PRBC for hgb 6.5. Goals of care discussion. Patient indicated he desired to transition to comfort. Did not want to pursue intermittent HD and agreed to DNR. Verbalized his primary concern was comfort and having an opportunity to spend quality time with his family. He and his family agreed to in=patient hospice. His HD cath was removed. He will be discharged to in-patient hospice with plan of care as outlined below.   Current plan by active problem list    Acute metabolic encephalopathy, secondary to uremia Stage IV CKD of transplanted kidney, now requiring renal placement therapy -family indicating he would not want to pursue long term iHD. Has been off Crrt since yesterday. Plan Cont supportive care.  Encourage active family visiting.  They understand he may likely become more encephalopathic as renal fxn worsens    Chronic pleural effusions from volume overload and heart failure Plan Continue every other day pleural drainage (last drained both tubes 6/16) drain dry OR stop if has discomfort. Next due to drain  6/18   Enterococcus faecalis bacteremia - TEE neg for vegetation  Plan: Stopping antibiotics    Type II diabetes mellitus Plan: Ssi and lantus     Hypertension Plan: Cont home  meds    Acute on chronic anemia Pancytopenia: on Tacrolimus for kidney transplant Likely secondary to worsening renal failure in setting of chronic disease Plan: Dc further lab work No further transfusions   Hx of OSA?: patient states he wears CPAP at bedtime at home Plan: CPAP at [facility]     Discharge Exam: Blood Pressure (Abnormal) 102/47   Pulse 64   Temperature 98.4 F (36.9 C) (Oral)   Respiration 20   Height '5\' 11"'$  (1.803 m)   Weight 100.8 kg   Oxygen Saturation 100%   Body Mass Index 30.99 kg/m   General: This is a very pleasant 65 year old male he is resting in bed and in no acute distress this morning HEENT normocephalic atraumatic no JVD mucous membranes moist Pulmonary: Diminished both bases, currently nasal cannula oxygen.  No accessory use.  Has bilateral Pleurx tubes dressings are intact Cardiac: Regular rate and rhythm Abdomen: Soft not tender no organomegaly Extremities: Warm dry brisk cap refill Neuro: Awake interactive no distress following commands GU: Minimal urine from bladder scan  Labs at discharge   Lab Results  Component Value Date   CREATININE 3.05 (H) 01/14/2021   BUN 31 (H) 01/14/2021   NA 136 01/14/2021   K 4.5 01/14/2021   CL 103 01/14/2021   CO2 27 01/14/2021   Lab Results  Component Value Date   WBC 2.9 (L) 01/14/2021   HGB 6.5 (LL) 01/14/2021   HCT 21.2 (L) 01/14/2021   MCV 87.6 01/14/2021   PLT 88 (L) 01/14/2021   Lab Results  Component Value Date   ALT 15 01/07/2021   AST 21 01/07/2021   ALKPHOS 92 01/07/2021   BILITOT 0.6 01/07/2021   Lab Results  Component Value Date   INR 1.2 01/05/2021   INR 1.3 (H) 12/31/2020   INR 1.2 09/04/2020    Current radiological studies    ECHO TEE  Result Date: 01/13/2021    TRANSESOPHOGEAL ECHO REPORT   Patient Name:   SHABSI CUTHBERT Date of Exam: 01/13/2021 Medical Rec #:  [id number]       Height:       71.0 in Accession #:    [id number]      Weight:       222.7 lb Date of Birth:   1955/11/15       BSA:          2.207 m Patient Age:    47 years        BP:           96/46 mmHg Patient Gender: M               HR:           66 bpm. Exam Location:  Inpatient Procedure: Transesophageal Echo, Cardiac Doppler and Color Doppler Indications:     Bacteremia  History:         Patient has prior history of Echocardiogram examinations, most                  recent 01/07/2021. CHF, Mitral Valve Disease and Aortic Valve                  Disease; Risk Factors:Sleep Apnea, Hypertension and Diabetes.  PVD. GERD. ESRD. S/P TAVR 09/2020 (VALVE 26 ULTRA SAPIEN).  Sonographer:     Clayton Lefort RDCS (AE) Referring Phys:  [id number] Leanor Kail Diagnosing Phys: Skeet Latch MD PROCEDURE: After discussion of the risks and benefits of a TEE, an informed consent was obtained from the patient. The transesophogeal probe was passed without difficulty through the esophogus of the patient. Sedation performed by different physician. The patient was monitored while under deep sedation. Anesthestetic sedation was provided intravenously by Anesthesiology: 136.'35mg'$  of Propofol. Image quality was good. The patient's vital signs; including heart rate, blood pressure, and oxygen saturation; remained stable throughout the procedure. The patient developed no complications during the procedure. IMPRESSIONS  1. Left ventricular ejection fraction, by estimation, is 60 to 65%. The left ventricle has normal function. The left ventricle has no regional wall motion abnormalities.  2. Right ventricular systolic function is normal. The right ventricular size is normal.  3. No left atrial/left atrial appendage thrombus was detected.  4. The mitral valve is normal in structure. No evidence of mitral valve regurgitation. Mild to moderate mitral stenosis. The mean mitral valve gradient is 5.0 mmHg. Moderate mitral annular calcification.  5. TAVR prosthesis functioning properly with mildly elevated gradients. There is no evidence  of endocarditis. The aortic valve has been repaired/replaced. Aortic valve regurgitation is not visualized. Mild aortic valve stenosis. Procedure Date: 09/08/2020. Aortic valve mean gradient measures 16.0 mmHg. Aortic valve Vmax measures 2.61 m/s. Conclusion(s)/Recommendation(s): Normal biventricular function without evidence of hemodynamically significant valvular heart disease. No evidence of vegetation/infective endocarditis on this transesophageal echocardiogram. FINDINGS  Left Ventricle: Left ventricular ejection fraction, by estimation, is 60 to 65%. The left ventricle has normal function. The left ventricle has no regional wall motion abnormalities. The left ventricular internal cavity size was normal in size. There is  no left ventricular hypertrophy. Right Ventricle: The right ventricular size is normal. No increase in right ventricular wall thickness. Right ventricular systolic function is normal. Left Atrium: Left atrial size was normal in size. No left atrial/left atrial appendage thrombus was detected. Right Atrium: Right atrial size was normal in size. Pericardium: There is no evidence of pericardial effusion. Mitral Valve: The mitral valve is normal in structure. Moderate mitral annular calcification. No evidence of mitral valve regurgitation. Mild to moderate mitral valve stenosis. MV peak gradient, 14.3 mmHg. The mean mitral valve gradient is 5.0 mmHg with average heart rate of 67 bpm. Tricuspid Valve: The tricuspid valve is normal in structure. Tricuspid valve regurgitation is trivial. No evidence of tricuspid stenosis. Aortic Valve: TAVR prosthesis functioning properly with mildly elevated gradients. There is no evidence of endocarditis. The aortic valve has been repaired/replaced. Aortic valve regurgitation is not visualized. Mild aortic stenosis is present. Aortic valve mean gradient measures 16.0 mmHg. Aortic valve peak gradient measures 27.2 mmHg. There is a 26 mm Sapien prosthetic, stented  (TAVR) valve present in the aortic position. Pulmonic Valve: The pulmonic valve was normal in structure. Pulmonic valve regurgitation is trivial. No evidence of pulmonic stenosis. Aorta: The aortic root is normal in size and structure. IAS/Shunts: No atrial level shunt detected by color flow Doppler.  LEFT VENTRICLE PLAX 2D LVOT diam:     2.20 cm LVOT Area:     3.80 cm  AORTIC VALVE AV Vmax:      261.00 cm/s AV Vmean:     186.000 cm/s AV VTI:       0.549 m AV Peak Grad: 27.2 mmHg AV Mean Grad: 16.0 mmHg MITRAL VALVE MV  Peak grad: 14.3 mmHg  SHUNTS MV Mean grad: 5.0 mmHg   Systemic Diam: 2.20 cm MV Vmax:      1.89 m/s MV Vmean:     107.0 cm/s Skeet Latch MD Electronically signed by Skeet Latch MD Signature Date/Time: 01/13/2021/10:57:26 AM    Final     Disposition:    Discharge disposition: 51-Hospice/Medical Facility      Discharge Instructions     Ambulatory Pleural Drainage Schedule   Complete by: As directed    Drain daily, up to max of 1L until patient is only able to drain out 127m. If <1557mfor 3 consecutive drains every other day, then call Interventional Radiology [phone]) for evaluation and possible removal.   Diet - low sodium heart healthy   Complete by: As directed    Increase activity slowly   Complete by: As directed    No wound care   Complete by: As directed        Allergies as of 01/14/2021     Allergen Reactions Comments   Sulfa Antibiotics Rash         Medication List     Stop taking these medications    acetaminophen-codeine 300-30 MG tablet Commonly known as: TYLENOL #3   aspirin 81 MG EC tablet Replaced by: aspirin 81 MG chewable tablet   atorvastatin 10 MG tablet Commonly known as: LIPITOR   BD Pen Needle Nano 2nd Gen 32G X 4 MM Misc Generic drug: Insulin Pen Needle   cyclobenzaprine 5 MG tablet Commonly known as: FLEXERIL   fenofibrate 48 MG tablet Commonly known as: TRICOR   glyBURIDE 5 MG tablet Commonly known as:  DIABETA   hydrALAZINE 50 MG tablet Commonly known as: APRESOLINE   insulin aspart 100 UNIT/ML injection Commonly known as: novoLOG Replaced by: insulin aspart 100 UNIT/ML injection   Insulin Pen Needle 32G X 4 MM Misc   mycophenolate 360 MG Tbec EC tablet Commonly known as: MYFORTIC   OneTouch Verio test strip Generic drug: glucose blood   polyethylene glycol 17 g packet Commonly known as: MIRALAX / GLYCOLAX   tacrolimus 1 MG capsule Commonly known as: PROGRAF       Take these medications    acetaminophen 325 MG tablet Commonly known as: TYLENOL Take 2 tablets (650 mg total) by mouth every 4 (four) hours as needed for headache or mild pain. What changed:  medication strength how much to take when to take this reasons to take this   aspirin 81 MG chewable tablet Chew 1 tablet (81 mg total) by mouth daily. Start taking on: January 15, 2021 Replaces: aspirin 81 MG EC tablet   carvedilol 3.125 MG tablet Commonly known as: COREG Take 1 tablet (3.125 mg total) by mouth 2 (two) times daily with a meal.   clopidogrel 75 MG tablet Commonly known as: PLAVIX TAKE 1 TABLET (75 MG TOTAL) BY MOUTH DAILY WITH BREAKFAST. What changed: when to take this   docusate sodium 100 MG capsule Commonly known as: COLACE Take 1 capsule (100 mg total) by mouth 2 (two) times daily. What changed: when to take this   feeding supplement Liqd Take 237 mLs by mouth 3 (three) times daily between meals.   furosemide 20 MG tablet Commonly known as: LASIX Take 4 tablets (80 mg total) by mouth daily. What changed: how much to take   insulin aspart 100 UNIT/ML injection Commonly known as: novoLOG Inject 0-15 Units into the skin 3 (three) times daily with meals. Replaces: insulin  aspart 100 UNIT/ML injection   insulin detemir 100 UNIT/ML FlexPen Commonly known as: LEVEMIR Inject 10 Units into the skin daily.   multivitamin with minerals Tabs tablet Take 1 tablet by mouth daily. Start  taking on: January 15, 2021   pantoprazole 40 MG tablet Commonly known as: PROTONIX TAKE 1 TABLET (40 MG TOTAL) BY MOUTH DAILY.   sodium chloride flush 0.9 % Soln Commonly known as: NS Inject 3 mLs into the vein every 12 (twelve) hours.   sodium chloride flush 0.9 % Soln Commonly known as: NS Inject 3 mLs into the vein as needed.   traMADol 50 MG tablet Commonly known as: ULTRAM Take 1 tablet (50 mg total) by mouth every 12 (twelve) hours as needed for moderate pain.   traZODone 50 MG tablet Commonly known as: DESYREL Take 1 tablet (50 mg total) by mouth at bedtime. What changed:  when to take this reasons to take this         Follow-up appointment   NA Discharge Condition:    stable  Physician Statement:   The Patient was personally examined, the discharge assessment and plan has been personally reviewed and I agree with ACNP Shiryl Ruddy's assessment and plan. 35  minutes of time have been dedicated to discharge assessment, planning and discharge instructions.   Signed: Clementeen Graham 01/14/2021, 3:10 PM

## 2021-01-14 NOTE — Progress Notes (Signed)
    Inter-disciplinary Goals of care family meeting   01/14/21  Will need to be re-evaluated in 7 days from above.   Members involved    Nurse Practitioner, Family member or next of Kin , and palliative care provider   Discussion:   Long discussion with the patient and his family at bedside. His biggest concern is that he does not want to be a burden for his family. He has long known that he does not want to pursue iHD and per his family has physically declined over the last year significantly. He started the conversation stating "I want to go palliative and not have dialysis". I offered him support and we (NP Shaffer and I) verified that this ment we would focus on comfort. I also further specified we would NOT provide ACLS or mechanical ventilation in this setting and would change his Code status to DNR. He verified that he understood this and agreed with his family present. After confirming DNR status and no escalation we went on to talk about disposition (home vs residential hospice). His family was concerned that they do not have the physical ability to meet his care needs at home. We discussed this further and determined that residential hospice would meet the patient's and family needs better.   Code status    Full DNR continue current measures but do not escalate  Disposition in next 7 days    ready for transfer to med-surg  Working on residential hospice with hope to have arranged by Monday   Erick Colace ACNP-BC [place] Pager # [phone] OR # [phone] if no answer

## 2021-01-14 NOTE — Plan of Care (Signed)
  Problem: Activity: Goal: Capacity to carry out activities will improve Outcome: Progressing   Problem: Cardiac: Goal: Ability to achieve and maintain adequate cardiopulmonary perfusion will improve Outcome: Progressing   Problem: Clinical Measurements: Goal: Ability to maintain clinical measurements within normal limits will improve Outcome: Progressing Goal: Will remain free from infection Outcome: Progressing Goal: Diagnostic test results will improve Outcome: Progressing Goal: Respiratory complications will improve Outcome: Progressing Goal: Cardiovascular complication will be avoided Outcome: Progressing   Problem: Activity: Goal: Risk for activity intolerance will decrease Outcome: Progressing   Problem: Nutrition: Goal: Adequate nutrition will be maintained Outcome: Progressing   Problem: Coping: Goal: Level of anxiety will decrease Outcome: Progressing   Problem: Elimination: Goal: Will not experience complications related to bowel motility Outcome: Progressing Goal: Will not experience complications related to urinary retention Outcome: Progressing   Problem: Pain Managment: Goal: General experience of comfort will improve Outcome: Progressing   Problem: Safety: Goal: Ability to remain free from injury will improve Outcome: Progressing   Problem: Skin Integrity: Goal: Risk for impaired skin integrity will decrease Outcome: Progressing

## 2021-01-14 NOTE — Progress Notes (Signed)
Manufacturing engineer Steven [name])   Referral received for EOL care at [name].   Spoke with mother Steven [name] to confirm interest.   [name] is appropriate to transfer to [facility].    Please leave IV in place.   Once completed, please fax dc summary to [phone].   RN staff, you may call report at any time to [phone], room is assigned when report is called.   Please send completed DNR with patient.   Updated attending and [facility] manager via [place].

## 2021-01-14 NOTE — Progress Notes (Signed)
Daily Progress Note   Patient Name: Steven [name]       Date: 01/14/2021 DOB: Jan 23, 1956  Age: 65 y.o. MRN#: [id number] Attending Physician: Jacky Kindle, MD Primary Care Physician: Isaac Bliss, Rayford Halsted, MD Admit Date: 01/05/2021  Reason for Consultation/Follow-up: Establishing goals of care  Subjective: Tells me he feels okay, not able to sleep much last night, feeling overwhelmed  Length of Stay: 9  Current Medications: Scheduled Meds:   sodium chloride   Intravenous Once   aspirin  81 mg Oral Daily   atorvastatin  10 mg Oral Daily   carvedilol  3.125 mg Oral BID WC   Chlorhexidine Gluconate Cloth  6 each Topical Daily   clopidogrel  75 mg Oral Daily   docusate sodium  100 mg Oral Daily   feeding supplement  237 mL Oral TID BM   fenofibrate  160 mg Oral Daily   insulin aspart  0-15 Units Subcutaneous TID WC   insulin aspart  0-5 Units Subcutaneous QHS   insulin glargine  10 Units Subcutaneous QHS   multivitamin with minerals  1 tablet Oral Daily   pantoprazole  40 mg Oral Daily   sodium chloride flush  3 mL Intravenous Q12H   tacrolimus  1 mg Oral BID    Continuous Infusions:  sodium chloride Stopped (01/13/21 0657)   ampicillin (OMNIPEN) IV 2 g (01/14/21 1048)   furosemide      PRN Meds: sodium chloride, acetaminophen, cyclobenzaprine, lidocaine, ondansetron (ZOFRAN) IV, sodium chloride flush, traMADol, traZODone  Physical Exam Constitutional:      General: He is not in acute distress. Pulmonary:     Effort: Pulmonary effort is normal.  Skin:    General: Skin is warm and dry.  Neurological:     Mental Status: He is alert and oriented to person, place, and time.  Psychiatric:     Comments: Tearful during conversation            Vital Signs: BP 130/60   Pulse 69    Temp 98.4 F (36.9 C) (Oral)   Resp (!) 26   Ht '5\' 11"'$  (1.803 m)   Wt 100.8 kg   SpO2 100%   BMI 30.99 kg/m  SpO2: SpO2: 100 % O2 Device: O2 Device: Nasal Cannula O2 Flow Rate: O2 Flow Rate (L/min): 2 L/min  Intake/output summary:  Intake/Output Summary (Last 24 hours) at 01/14/2021 1058 Last data filed at 01/14/2021 0900 Gross per 24 hour  Intake 361.24 ml  Output 350 ml  Net 11.24 ml   LBM: Last BM Date: 01/12/21 Baseline Weight: Weight: 111.1 kg Most recent weight: Weight: 100.8 kg       Palliative Assessment/Data: PPS 40%    Flowsheet Rows    Flowsheet Row Most Recent Value  Intake Tab   Referral Department Cardiology  Unit at Time of Referral ICU  Palliative Care Primary Diagnosis Nephrology  Date Notified 01/12/21  Palliative Care Type New Palliative care  Reason for referral Clarify Goals of Care  Date of Admission 01/05/21  Date first seen by Palliative Care 01/13/21  # of days Palliative referral response time 1 Day(s)  # of days IP prior to Palliative referral 7  Clinical Assessment   Psychosocial & Spiritual Assessment   Palliative Care Outcomes        Patient Active Problem List   Diagnosis Date Noted   Bacteremia    Pressure injury of skin 01/11/2021   Acute on chronic diastolic CHF (congestive heart failure) ([place]) 01/05/2021   CHF (congestive heart failure) ([place]) 01/05/2021   Acute pulmonary edema (HCC)    Pleural effusion 12/24/2020   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   Acquired absence of left leg below knee (HCC)    Severe aortic stenosis    CKD (chronic kidney disease), stage III ([place]) 06/01/2020   Chronic pain 02/28/2020   Vitamin D deficiency 02/25/2020   Thrombocytopenia ([place]) 02/25/2020   Normocytic anemia 02/25/2020   Diabetic polyneuropathy associated with type 2 diabetes mellitus ([place])    Venous insufficiency    Obesity, Class III, BMI 40-49.9 (morbid obesity) ([place])    AKI (acute kidney injury)  ([place]) 07/17/2017   Kidney transplant recipient 05/24/2015   Diabetes mellitus ([place]) 05/10/2012   Hyperlipidemia 05/10/2012   Essential hypertension 05/10/2012    Palliative Care Assessment & Plan   HPI: 65 y.o. 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 Work up revealed pulmonary edema, pleural effusion, and worsening kidney function. He was found to have Enterococcus faecalis bacteremia. TEE ruled out infective endocarditis. Renal failure worsened with signs of uremic encephalopathy. CRRT was initiated. CRRT was stopped morning of 6/15. Patient has expressed he is not interested in long term HD. PMT consulted to discuss [place].     Assessment: Meeting today at bedside to include CCM NP Marni Griffon, patient, wife, parents, and sister.   [name] condition was described to patient and family - all questions addressed. Patient is clear in 2 goals that he does not want HD and he does not want to be a burden to his family.   Poor prognosis and progression of renal failure was discussed. Expectations at end of life were discussed.   Patient and family agree they would like to focus on comfort and symptom management and are not interested in any further aggressive measures. Code status change to DNR was discussed.  Discussed involving hospice support - hospice care at home vs hospice facility. Discussed type of support provided by each. Patient and family agree that transfer to hospice facility is most appropriate.  We discussed plan to continue current measures for now until hospice facility bed is available.   Recommendations/Plan: NO HD Code status change to DNR Transfer to hospice facility when bed  available  Prognosis:  < 2 weeks  Discharge Planning: Hospice facility  Care plan was discussed with patient, his family, hospice liaison, CCM, TOC  Thank you for allowing the Palliative Medicine Team to assist in the care of this patient.   Total Time 50 minutes Prolonged Time Billed  no       Greater than 50%  of this time was spent counseling and coordinating care related to the above assessment and plan.  Juel Burrow, DNP, [facility] Acute [facility] Palliative Medicine Team Team Phone # [phone]  Pager [phone]

## 2021-01-15 LAB — TYPE AND SCREEN
ABO/RH(D): O POS
Antibody Screen: NEGATIVE
Unit division: 0

## 2021-01-15 LAB — BPAM RBC
Blood Product Expiration Date: 202207122359
ISSUE DATE / TIME: 202206160804
Unit Type and Rh: 5100

## 2021-01-29 DEATH — deceased

## 2022-02-08 IMAGING — CT CT ANGIO CHEST
2 of 4 series · 15 of 36 positions shown · IV contrast (APPLIED)
Comparison: None.

CLINICAL DATA: 64-year-old male with history of severe aortic
stenosis. Preprocedural study prior to potential transcatheter
aortic valve replacement (TAVR) procedure.

EXAM:
CT ANGIOGRAPHY CHEST, ABDOMEN AND PELVIS
TECHNIQUE: Multidetector CT imaging through the chest, abdomen and pelvis was
performed using the standard protocol during bolus administration of
intravenous contrast. Multiplanar reconstructed images and MIPs were
obtained and reviewed to evaluate the vascular anatomy.
CONTRAST:  100mL OMNIPAQUE IOHEXOL 350 MG/ML SOLN

[Series 5: ax thins · axial · 0.59mm/px · z∈[-674,+30]mm · 14 of 762 slices shown]
[im 29/762  lung]
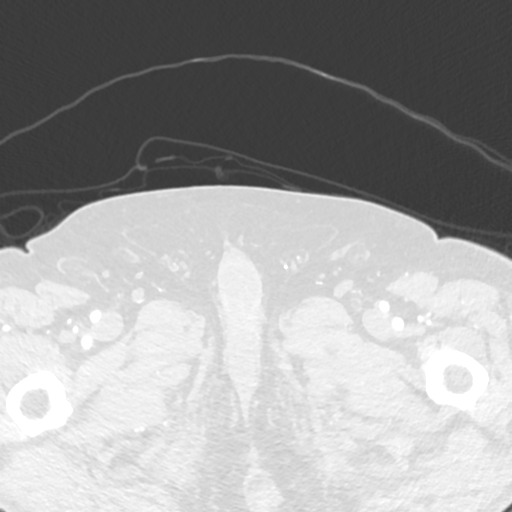
[im 85/762  mediastinal]
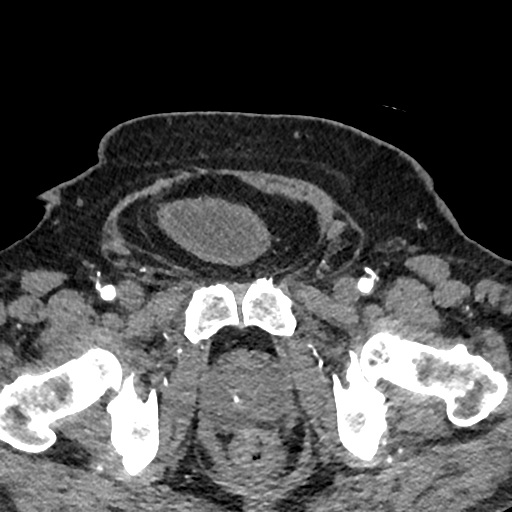
[im 141/762  lung]
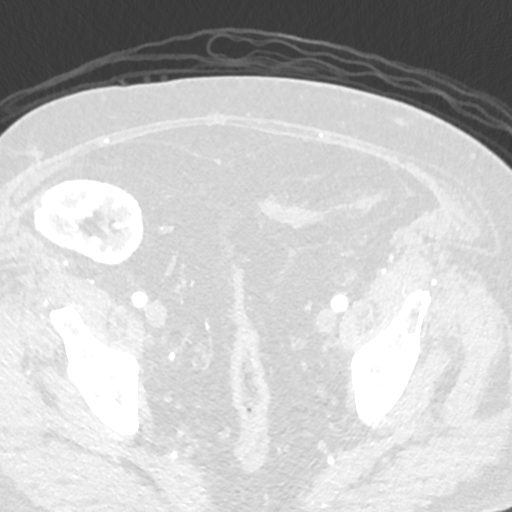
[im 198/762  mediastinal]
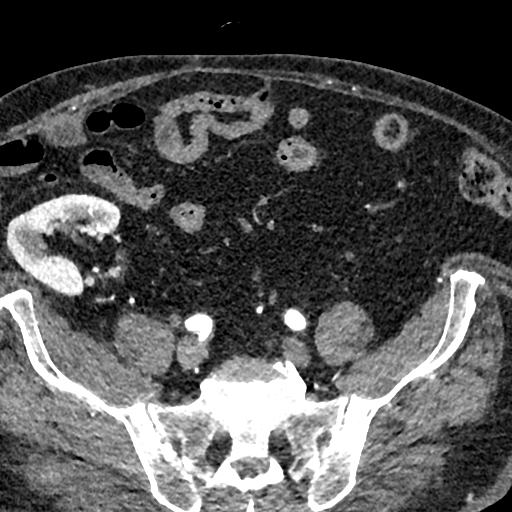
[im 254/762  lung]
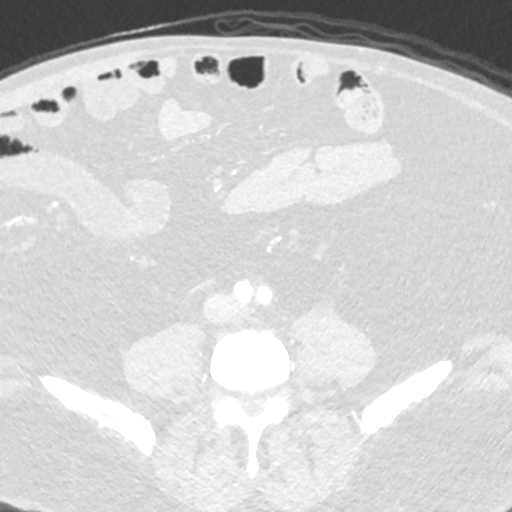
[im 311/762  mediastinal]
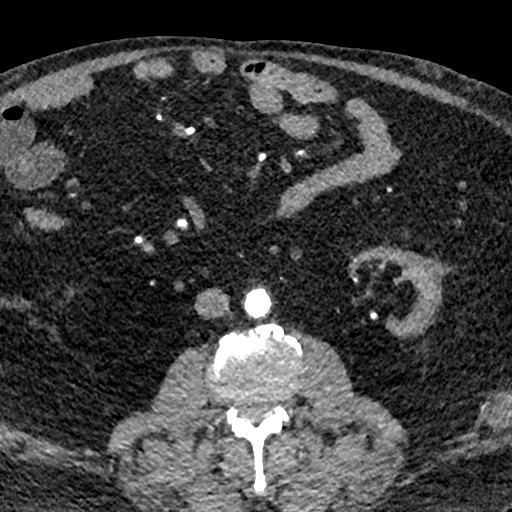
[im 367/762  lung]
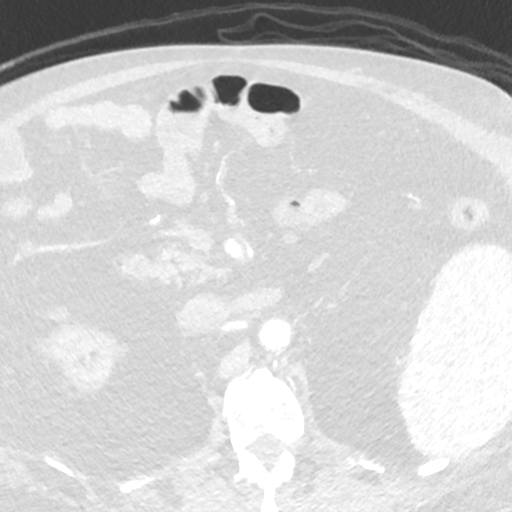
[im 395/762  mediastinal]
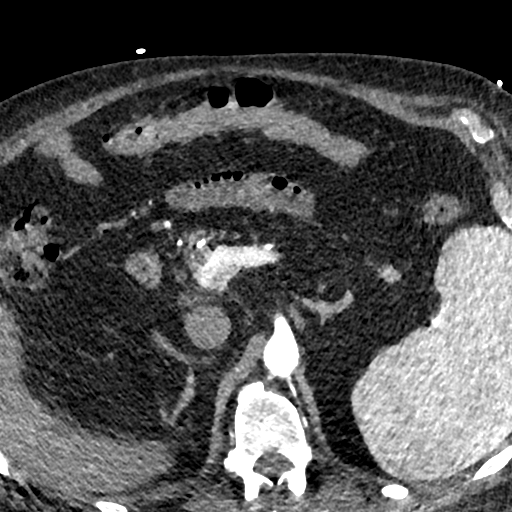
[im 451/762  lung]
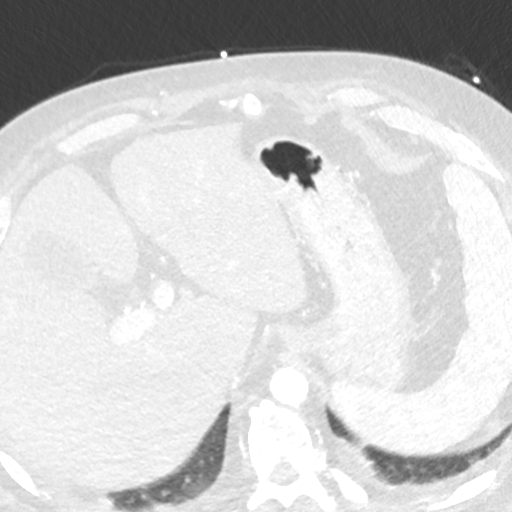
[im 508/762  mediastinal]
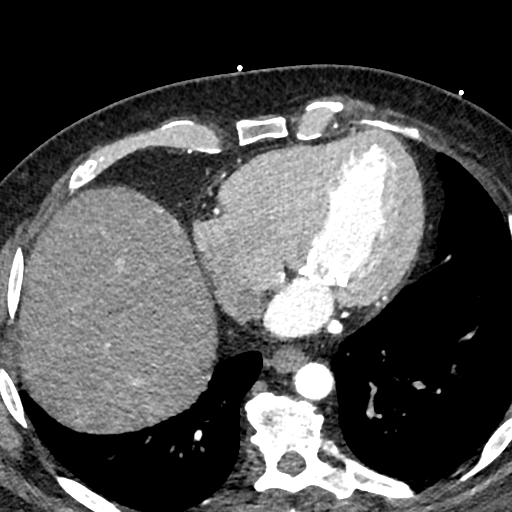
[im 564/762  lung]
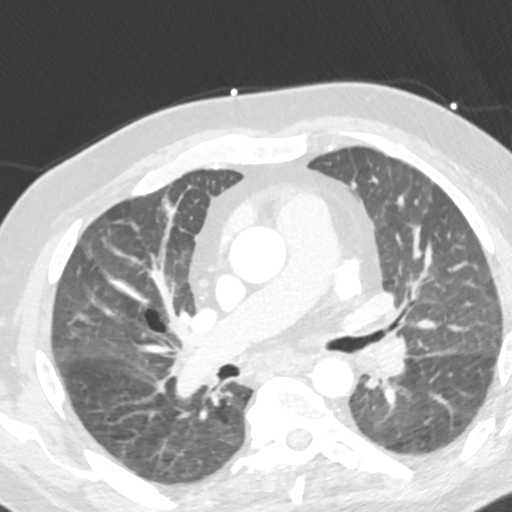
[im 621/762  mediastinal]
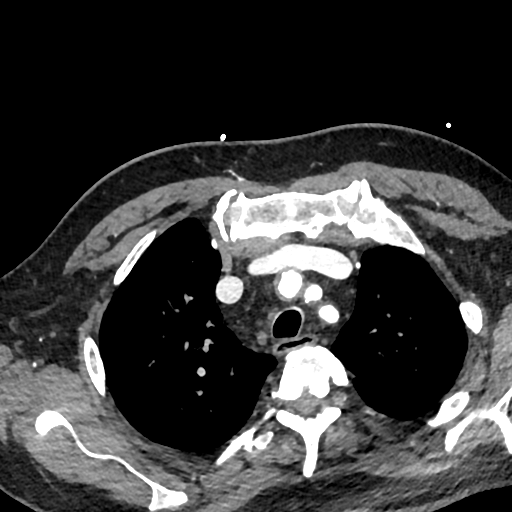
[im 677/762  lung]
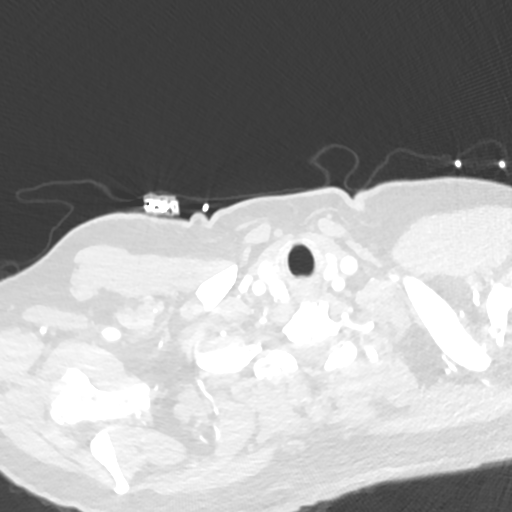
[im 733/762  mediastinal]
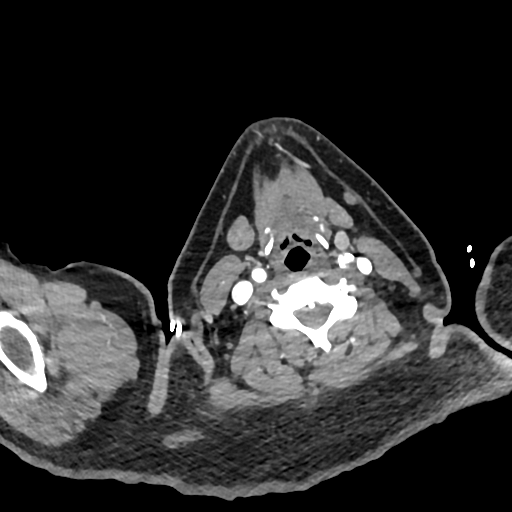

[Series 6: cor · coronal · 0.96mm/px · 1 of 178 slices shown]
[im 89/178  mediastinal]
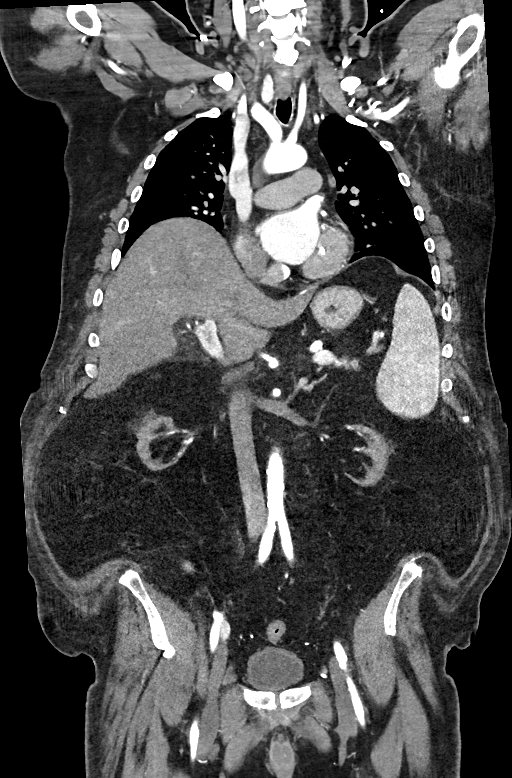

[15 of 36 positions shown; findings below may reference images not displayed]

FINDINGS: CTA CHEST FINDINGS

Cardiovascular: Heart size is mildly enlarged. There is no
significant pericardial fluid, thickening or pericardial
calcification. There is aortic atherosclerosis, as well as
atherosclerosis of the great vessels of the mediastinum and the
coronary arteries, including calcified atherosclerotic plaque in the
left main, left anterior descending, left circumflex and right
coronary arteries. Severe thickening calcification of the aortic
valve. Moderate to severe calcifications of the mitral annulus.
Dilatation of the pulmonic trunk (3.5 cm in diameter).

Mediastinum/Lymph Nodes: No pathologically enlarged mediastinal or
hilar lymph nodes. Multiple prominent borderline enlarged
mediastinal and right hilar lymph nodes are incidentally noted
(nonspecific). Esophagus is unremarkable in appearance. No axillary
lymphadenopathy.

Lungs/Pleura: Mosaic attenuation in the lungs bilaterally,
suggesting areas of air trapping from small airways disease. No
acute consolidative airspace disease. No pleural effusions. No
definite suspicious appearing pulmonary nodules or masses are noted.

Musculoskeletal/Soft Tissues: There are no aggressive appearing
lytic or blastic lesions noted in the visualized portions of the
skeleton.

CTA ABDOMEN AND PELVIS FINDINGS

Hepatobiliary: No suspicious cystic or solid hepatic lesions. No
intra or extrahepatic biliary ductal dilatation. Gallbladder is
normal in appearance.

Pancreas: No pancreatic mass. No pancreatic ductal dilatation. No
pancreatic or peripancreatic fluid collections or inflammatory
changes. Coarse calcifications in the proximal body of the pancreas
likely sequela of chronic pancreatitis.

Spleen: Unremarkable.

Adrenals/Urinary Tract: Severe atrophy of the native kidneys
bilaterally. Bilateral adrenal glands are normal in appearance. No
hydroureteronephrosis. Urinary bladder is normal in appearance.
Transplant kidney in the right renal fossa is normal in appearance,
without evidence of perinephric fluid collections or other acute
features.

Stomach/Bowel: The appearance of the stomach is normal. There is no
pathologic dilatation of small bowel or colon. Normal appendix.

Vascular/Lymphatic: Aortic atherosclerosis, with vascular findings
and measurements pertinent to potential TAVR procedure, as detailed
below. No aneurysm or dissection noted in the abdominal or pelvic
vasculature. No lymphadenopathy noted in the abdomen or pelvis.

Reproductive: Prostate gland and seminal vesicles are unremarkable
in appearance.

Other: No significant volume of ascites.  No pneumoperitoneum.

Musculoskeletal: There are no aggressive appearing lytic or blastic
lesions noted in the visualized portions of the skeleton.

VASCULAR MEASUREMENTS PERTINENT TO TAVR:

AORTA:

Minimal Aortic Riameter-OE x 13 mm

Severity of Aortic Calcification-mild-to-moderate

RIGHT PELVIS:

Right Common Iliac Artery -

Minimal 9iameter-I.T x 9.7 mm

Tortuosity - mild

Calcification-moderate

Right External Iliac Artery -

Minimal Kiameter-X.X x 7.7 mm

Tortuosity - mild

Calcification-mild

Right Common Femoral Artery -

Minimal Tiameter-3.D x 6.6 mm

Tortuosity - mild

Calcification-mild

LEFT PELVIS:

Left Common Iliac Artery -

Minimal 9iameter-I.T x 8.7 mm

Tortuosity - mild

Calcification-mild

Left External Iliac Artery -

Minimal 5iameter-Y.8 x 7.3 mm

Tortuosity - mild

Calcification-mild

Left Common Femoral Artery -

Minimal 2iameter-Z.8 x 6.3 mm

Tortuosity-mild

Calcification-mild

Review of the MIP images confirms the above findings.
IMPRESSION: 1. Vascular findings and measurements pertinent to potential TAVR
procedure, as detailed above.
2. Severe thickening calcification of the aortic valve, compatible
with reported clinical history of severe aortic stenosis.
3. There is also moderate to severe calcifications of the mitral
annulus.
4. Aortic atherosclerosis, in addition to left main and 3 vessel
coronary artery disease. Please note that although the presence of
coronary artery calcium documents the presence of coronary artery
disease, the severity of this disease and any potential stenosis
cannot be assessed on this non-gated CT examination. Assessment for
potential risk factor modification, dietary therapy or pharmacologic
therapy may be warranted, if clinically indicated.
5. Dilatation of the pulmonic trunk (3.5 cm in diameter), concerning
for potential pulmonary arterial hypertension.
6. Mosaic attenuation in the lung parenchyma suggesting of areas of
air trapping from small airways disease. Further evaluation with
noncontrast high-resolution chest CT is recommended in 3 months to
better evaluate these findings.
7. Transplant kidney in the right iliac fossa with anastomosis to
the right external iliac artery.
8. Additional incidental findings, as above.

## 2022-07-19 IMAGING — DX DG CHEST 2V
2 series · 2 of 2 positions shown · non-contrast
Comparison: 12/11/2020

CLINICAL DATA: Post TAVR

EXAM:
CHEST - 2 VIEW

[dg chest 2 view (1 of 2)]
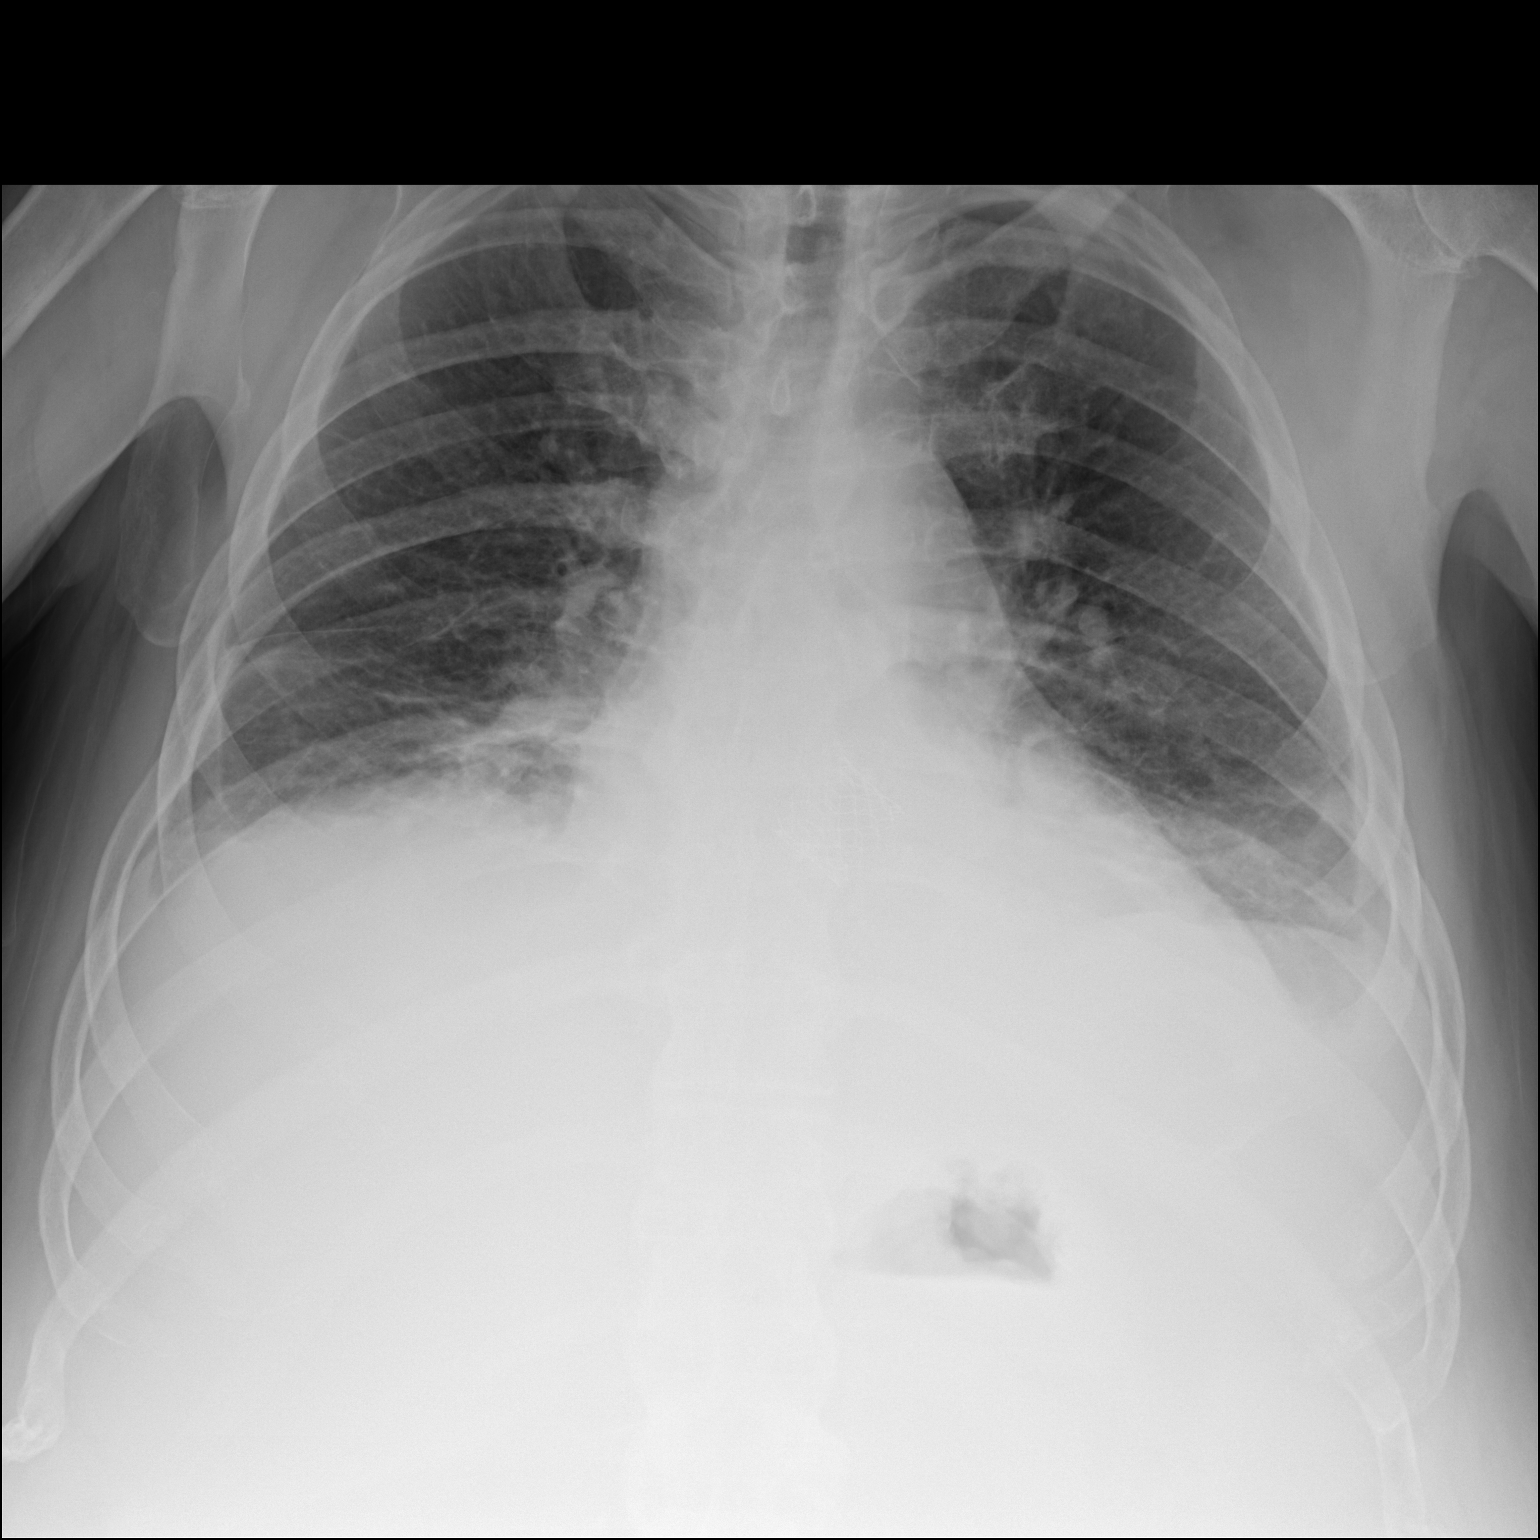

[dg chest 2 view (2 of 2)]
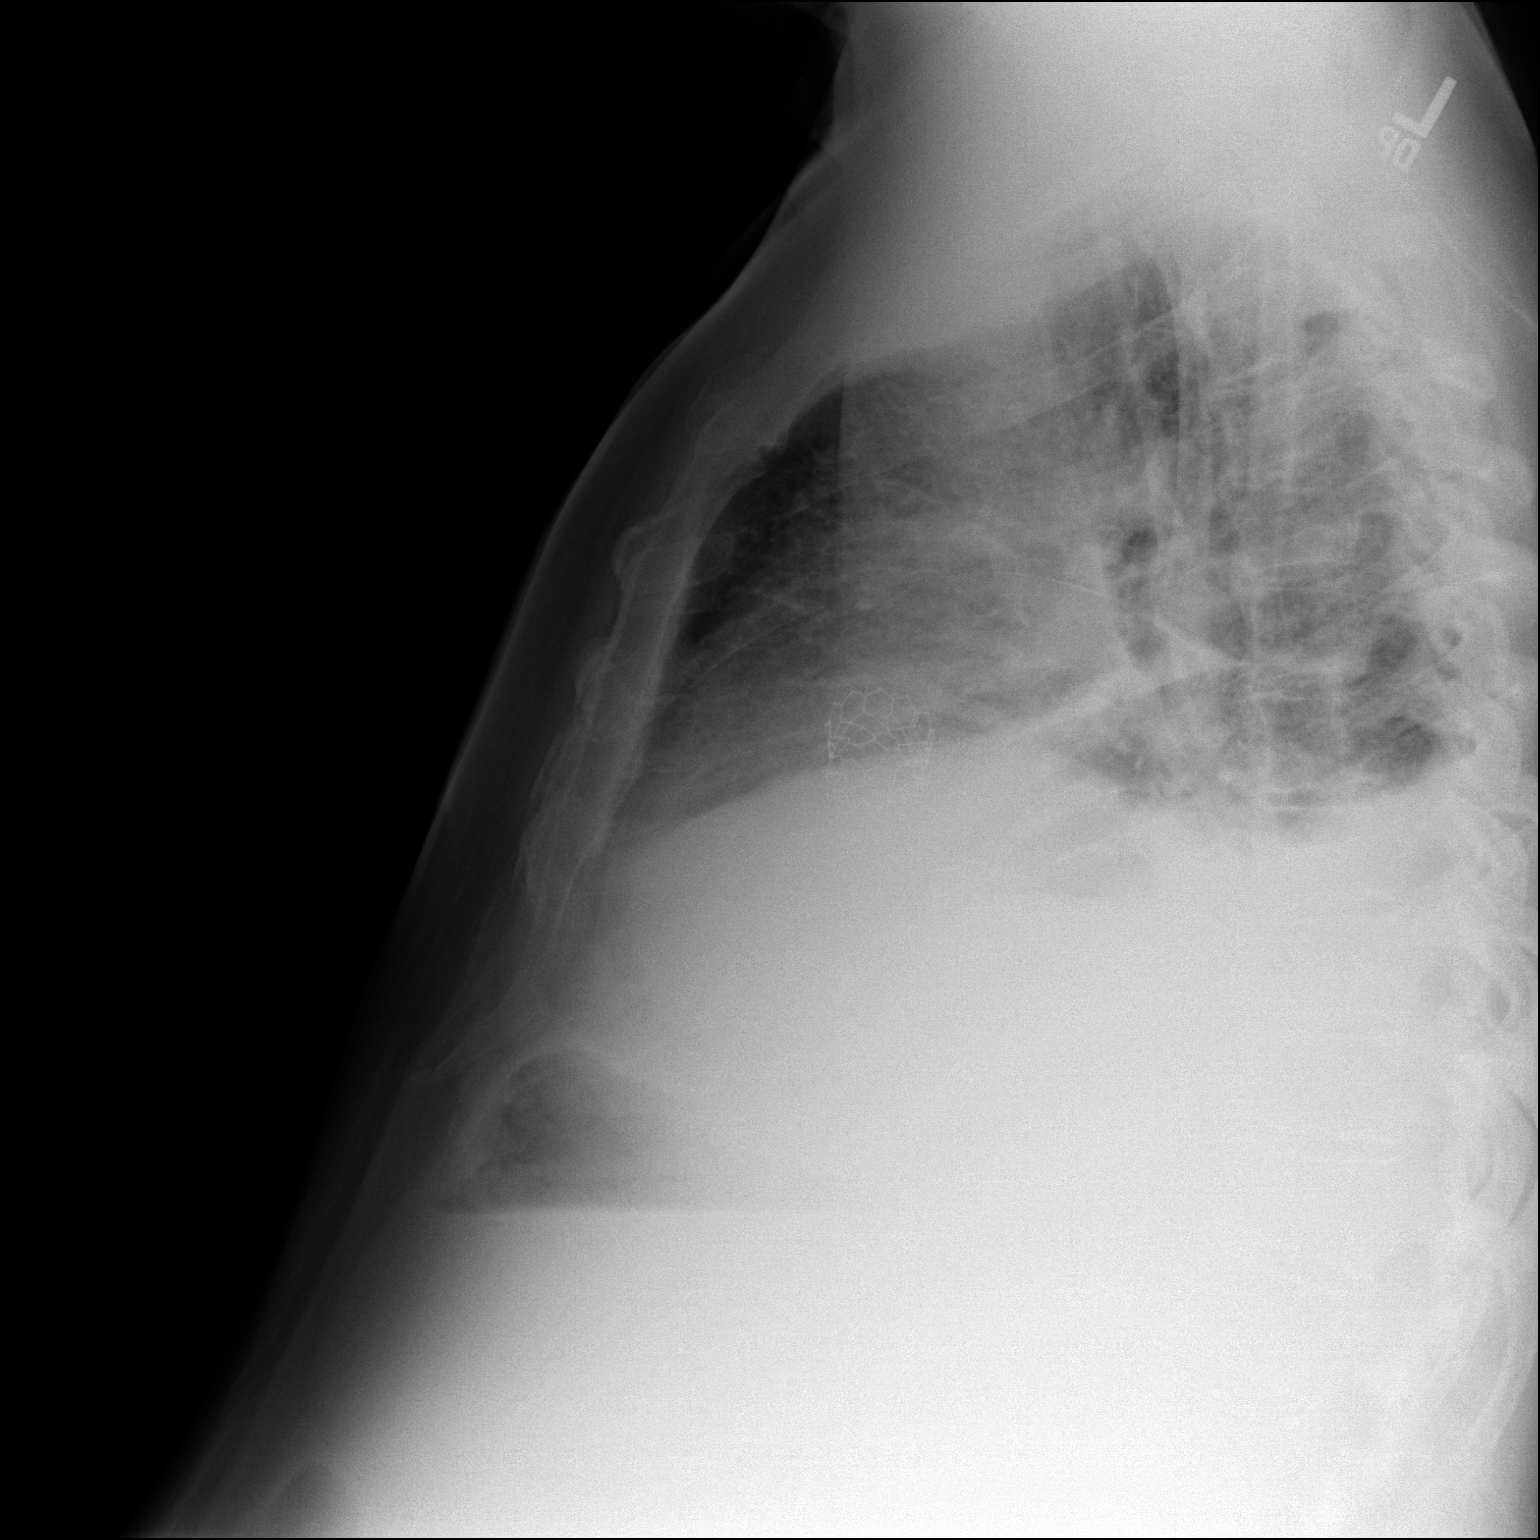

[2 of 2 positions shown; findings below may reference images not displayed]

FINDINGS: Enlargement of cardiac silhouette post TAVR.

Mediastinal contours and pulmonary vascularity normal.

Bibasilar pleural effusions and atelectasis.

Upper lungs clear.

No pneumothorax or acute osseous findings.
IMPRESSION: Enlargement of cardiac silhouette post TAVR.

Bibasilar pleural effusions and atelectasis.
